# Patient Record
Sex: Male | Born: 1945
Health system: Southern US, Community
[De-identification: ages and names within clinical notes are randomized; demographics above are authoritative.]

## PROBLEM LIST (undated history)

## (undated) DIAGNOSIS — I4891 Unspecified atrial fibrillation: Secondary | ICD-10-CM

## (undated) DIAGNOSIS — Z87442 Personal history of urinary calculi: Secondary | ICD-10-CM

## (undated) DIAGNOSIS — M069 Rheumatoid arthritis, unspecified: Secondary | ICD-10-CM

## (undated) DIAGNOSIS — R519 Headache, unspecified: Secondary | ICD-10-CM

## (undated) DIAGNOSIS — E785 Hyperlipidemia, unspecified: Secondary | ICD-10-CM

## (undated) DIAGNOSIS — Z9289 Personal history of other medical treatment: Secondary | ICD-10-CM

## (undated) DIAGNOSIS — I10 Essential (primary) hypertension: Secondary | ICD-10-CM

## (undated) DIAGNOSIS — T8859XA Other complications of anesthesia, initial encounter: Secondary | ICD-10-CM

## (undated) DIAGNOSIS — R51 Headache: Secondary | ICD-10-CM

## (undated) DIAGNOSIS — J189 Pneumonia, unspecified organism: Secondary | ICD-10-CM

## (undated) DIAGNOSIS — E119 Type 2 diabetes mellitus without complications: Secondary | ICD-10-CM

## (undated) DIAGNOSIS — R001 Bradycardia, unspecified: Secondary | ICD-10-CM

## (undated) DIAGNOSIS — J42 Unspecified chronic bronchitis: Secondary | ICD-10-CM

## (undated) DIAGNOSIS — K219 Gastro-esophageal reflux disease without esophagitis: Secondary | ICD-10-CM

## (undated) DIAGNOSIS — K449 Diaphragmatic hernia without obstruction or gangrene: Secondary | ICD-10-CM

## (undated) DIAGNOSIS — Z973 Presence of spectacles and contact lenses: Secondary | ICD-10-CM

## (undated) DIAGNOSIS — T7840XA Allergy, unspecified, initial encounter: Secondary | ICD-10-CM

## (undated) DIAGNOSIS — H9190 Unspecified hearing loss, unspecified ear: Secondary | ICD-10-CM

## (undated) DIAGNOSIS — D649 Anemia, unspecified: Secondary | ICD-10-CM

## (undated) DIAGNOSIS — M1990 Unspecified osteoarthritis, unspecified site: Secondary | ICD-10-CM

## (undated) HISTORY — DX: Hyperlipidemia, unspecified: E78.5

## (undated) HISTORY — DX: Diaphragmatic hernia without obstruction or gangrene: K44.9

## (undated) HISTORY — PX: JOINT REPLACEMENT: SHX530

## (undated) HISTORY — PX: KNEE ARTHROSCOPY: SHX127

## (undated) HISTORY — PX: OTHER SURGICAL HISTORY: SHX169

## (undated) HISTORY — DX: Unspecified atrial fibrillation: I48.91

## (undated) HISTORY — DX: Gastro-esophageal reflux disease without esophagitis: K21.9

## (undated) HISTORY — PX: INGUINAL HERNIA REPAIR: SUR1180

## (undated) HISTORY — DX: Allergy, unspecified, initial encounter: T78.40XA

## (undated) HISTORY — DX: Rheumatoid arthritis, unspecified: M06.9

---

## 2000-02-25 ENCOUNTER — Emergency Department (HOSPITAL_COMMUNITY): Admission: EM | Admit: 2000-02-25 | Discharge: 2000-02-25 | Payer: Self-pay | Admitting: Internal Medicine

## 2001-05-15 HISTORY — PX: TOTAL KNEE ARTHROPLASTY: SHX125

## 2001-07-30 ENCOUNTER — Encounter: Payer: Self-pay | Admitting: Orthopedic Surgery

## 2001-08-05 ENCOUNTER — Inpatient Hospital Stay (HOSPITAL_COMMUNITY): Admission: RE | Admit: 2001-08-05 | Discharge: 2001-08-07 | Payer: Self-pay | Admitting: Orthopedic Surgery

## 2002-06-23 ENCOUNTER — Emergency Department (HOSPITAL_COMMUNITY): Admission: EM | Admit: 2002-06-23 | Discharge: 2002-06-23 | Payer: Self-pay | Admitting: Emergency Medicine

## 2002-08-20 ENCOUNTER — Encounter: Payer: Self-pay | Admitting: Cardiovascular Disease

## 2002-10-03 ENCOUNTER — Encounter: Payer: Self-pay | Admitting: *Deleted

## 2002-10-03 ENCOUNTER — Ambulatory Visit (HOSPITAL_COMMUNITY): Admission: RE | Admit: 2002-10-03 | Discharge: 2002-10-03 | Payer: Self-pay | Admitting: *Deleted

## 2004-02-14 ENCOUNTER — Encounter: Admission: RE | Admit: 2004-02-14 | Discharge: 2004-02-14 | Payer: Self-pay | Admitting: Family Medicine

## 2006-02-23 ENCOUNTER — Encounter: Payer: Self-pay | Admitting: Vascular Surgery

## 2006-02-23 ENCOUNTER — Ambulatory Visit (HOSPITAL_COMMUNITY): Admission: RE | Admit: 2006-02-23 | Discharge: 2006-02-23 | Payer: Self-pay | Admitting: Orthopedic Surgery

## 2006-05-15 DIAGNOSIS — Z9289 Personal history of other medical treatment: Secondary | ICD-10-CM

## 2006-05-15 HISTORY — DX: Personal history of other medical treatment: Z92.89

## 2006-05-15 HISTORY — PX: REVISION TOTAL KNEE ARTHROPLASTY: SUR1280

## 2006-09-07 ENCOUNTER — Ambulatory Visit: Payer: Self-pay | Admitting: Cardiology

## 2006-10-15 ENCOUNTER — Ambulatory Visit: Payer: Self-pay | Admitting: Cardiology

## 2006-10-15 LAB — CONVERTED CEMR LAB
Albumin: 3.9 g/dL (ref 3.5–5.2)
Alkaline Phosphatase: 58 units/L (ref 39–117)
Total CHOL/HDL Ratio: 3.2
Total Protein: 6.3 g/dL (ref 6.0–8.3)

## 2006-10-29 ENCOUNTER — Inpatient Hospital Stay (HOSPITAL_COMMUNITY): Admission: RE | Admit: 2006-10-29 | Discharge: 2006-11-01 | Payer: Self-pay | Admitting: Orthopedic Surgery

## 2006-10-29 ENCOUNTER — Ambulatory Visit: Payer: Self-pay | Admitting: Cardiovascular Disease

## 2006-11-14 ENCOUNTER — Encounter: Admission: RE | Admit: 2006-11-14 | Discharge: 2006-11-14 | Payer: Self-pay | Admitting: Orthopedic Surgery

## 2006-12-14 ENCOUNTER — Ambulatory Visit: Payer: Self-pay | Admitting: Cardiology

## 2007-03-25 ENCOUNTER — Encounter (INDEPENDENT_AMBULATORY_CARE_PROVIDER_SITE_OTHER): Payer: Self-pay | Admitting: Orthopedic Surgery

## 2007-03-25 ENCOUNTER — Ambulatory Visit: Payer: Self-pay | Admitting: *Deleted

## 2007-03-25 ENCOUNTER — Ambulatory Visit (HOSPITAL_COMMUNITY): Admission: RE | Admit: 2007-03-25 | Discharge: 2007-03-25 | Payer: Self-pay | Admitting: Orthopedic Surgery

## 2007-04-05 ENCOUNTER — Encounter: Admission: RE | Admit: 2007-04-05 | Discharge: 2007-04-05 | Payer: Self-pay | Admitting: Orthopedic Surgery

## 2007-05-14 ENCOUNTER — Encounter: Admission: RE | Admit: 2007-05-14 | Discharge: 2007-05-14 | Payer: Self-pay | Admitting: Orthopedic Surgery

## 2007-05-14 ENCOUNTER — Encounter (INDEPENDENT_AMBULATORY_CARE_PROVIDER_SITE_OTHER): Payer: Self-pay | Admitting: Interventional Radiology

## 2007-05-14 ENCOUNTER — Other Ambulatory Visit: Admission: RE | Admit: 2007-05-14 | Discharge: 2007-05-14 | Payer: Self-pay | Admitting: Interventional Radiology

## 2007-05-16 HISTORY — PX: CYSTECTOMY: SUR359

## 2007-06-07 ENCOUNTER — Encounter: Admission: RE | Admit: 2007-06-07 | Discharge: 2007-06-07 | Payer: Self-pay | Admitting: Orthopedic Surgery

## 2007-07-11 ENCOUNTER — Ambulatory Visit (HOSPITAL_COMMUNITY): Admission: RE | Admit: 2007-07-11 | Discharge: 2007-07-12 | Payer: Self-pay | Admitting: Orthopedic Surgery

## 2007-07-11 ENCOUNTER — Encounter (INDEPENDENT_AMBULATORY_CARE_PROVIDER_SITE_OTHER): Payer: Self-pay | Admitting: Orthopedic Surgery

## 2007-07-19 ENCOUNTER — Ambulatory Visit (HOSPITAL_COMMUNITY): Admission: RE | Admit: 2007-07-19 | Discharge: 2007-07-19 | Payer: Self-pay | Admitting: Orthopedic Surgery

## 2007-07-19 ENCOUNTER — Ambulatory Visit: Payer: Self-pay | Admitting: Vascular Surgery

## 2007-07-19 ENCOUNTER — Encounter (INDEPENDENT_AMBULATORY_CARE_PROVIDER_SITE_OTHER): Payer: Self-pay | Admitting: Orthopedic Surgery

## 2007-09-11 ENCOUNTER — Ambulatory Visit (HOSPITAL_COMMUNITY): Admission: RE | Admit: 2007-09-11 | Discharge: 2007-09-11 | Payer: Self-pay | Admitting: Orthopedic Surgery

## 2007-09-11 ENCOUNTER — Ambulatory Visit: Payer: Self-pay | Admitting: Surgery

## 2007-09-11 ENCOUNTER — Encounter (INDEPENDENT_AMBULATORY_CARE_PROVIDER_SITE_OTHER): Payer: Self-pay | Admitting: Orthopedic Surgery

## 2008-08-03 ENCOUNTER — Encounter: Admission: RE | Admit: 2008-08-03 | Discharge: 2008-11-01 | Payer: Self-pay | Admitting: Anesthesiology

## 2008-08-04 ENCOUNTER — Ambulatory Visit: Payer: Self-pay | Admitting: Anesthesiology

## 2008-09-01 ENCOUNTER — Ambulatory Visit: Payer: Self-pay | Admitting: Anesthesiology

## 2008-12-07 ENCOUNTER — Encounter: Admission: RE | Admit: 2008-12-07 | Discharge: 2009-03-07 | Payer: Self-pay | Admitting: Anesthesiology

## 2008-12-08 ENCOUNTER — Ambulatory Visit: Payer: Self-pay | Admitting: Anesthesiology

## 2009-02-02 ENCOUNTER — Ambulatory Visit: Payer: Self-pay | Admitting: Anesthesiology

## 2009-03-29 ENCOUNTER — Encounter: Admission: RE | Admit: 2009-03-29 | Discharge: 2009-05-12 | Payer: Self-pay | Admitting: Anesthesiology

## 2009-03-30 ENCOUNTER — Ambulatory Visit: Payer: Self-pay | Admitting: Anesthesiology

## 2009-05-28 ENCOUNTER — Encounter: Admission: RE | Admit: 2009-05-28 | Discharge: 2009-08-26 | Payer: Self-pay | Admitting: Anesthesiology

## 2009-06-01 ENCOUNTER — Ambulatory Visit: Payer: Self-pay | Admitting: Anesthesiology

## 2009-07-27 ENCOUNTER — Ambulatory Visit: Payer: Self-pay | Admitting: Anesthesiology

## 2009-11-17 ENCOUNTER — Emergency Department (HOSPITAL_COMMUNITY): Admission: EM | Admit: 2009-11-17 | Discharge: 2009-11-17 | Payer: Self-pay | Admitting: Emergency Medicine

## 2009-11-24 ENCOUNTER — Encounter: Admission: RE | Admit: 2009-11-24 | Discharge: 2010-02-22 | Payer: Self-pay | Admitting: Anesthesiology

## 2009-11-30 ENCOUNTER — Ambulatory Visit: Payer: Self-pay | Admitting: Anesthesiology

## 2010-01-19 ENCOUNTER — Ambulatory Visit (HOSPITAL_COMMUNITY): Admission: RE | Admit: 2010-01-19 | Discharge: 2010-01-19 | Payer: Self-pay | Admitting: General Surgery

## 2010-04-29 ENCOUNTER — Encounter
Admission: RE | Admit: 2010-04-29 | Discharge: 2010-04-29 | Payer: Self-pay | Source: Home / Self Care | Attending: Family Medicine | Admitting: Family Medicine

## 2010-07-28 LAB — CBC
HCT: 39.6 % (ref 39.0–52.0)
Hemoglobin: 14 g/dL (ref 13.0–17.0)
MCH: 31.4 pg (ref 26.0–34.0)

## 2010-07-28 LAB — URINALYSIS, ROUTINE W REFLEX MICROSCOPIC
Bilirubin Urine: NEGATIVE
Glucose, UA: NEGATIVE mg/dL
Protein, ur: NEGATIVE mg/dL
Specific Gravity, Urine: 1.022 (ref 1.005–1.030)
pH: 5.5 (ref 5.0–8.0)

## 2010-07-28 LAB — SURGICAL PCR SCREEN
MRSA, PCR: NEGATIVE
Staphylococcus aureus: NEGATIVE

## 2010-07-28 LAB — BASIC METABOLIC PANEL
CO2: 29 mEq/L (ref 19–32)
Chloride: 103 mEq/L (ref 96–112)
Creatinine, Ser: 1.05 mg/dL (ref 0.4–1.5)
GFR calc Af Amer: >60 mL/min (ref >60)
GFR calc non Af Amer: >60 mL/min (ref >60)
Glucose, Bld: 107 mg/dL — ABNORMAL HIGH (ref 70–99)
Potassium: 4.6 mEq/L (ref 3.5–5.1)
Sodium: 138 mEq/L (ref 135–145)

## 2010-07-28 LAB — GLUCOSE, CAPILLARY

## 2010-07-31 LAB — CBC
MCHC: 33.7 g/dL (ref 30.0–36.0)
Platelets: 232 10*3/uL (ref 150–400)
RBC: 4.63 MIL/uL (ref 4.22–5.81)
RDW: 13.1 % (ref 11.5–15.5)
WBC: 11.1 10*3/uL — ABNORMAL HIGH (ref 4.0–10.5)

## 2010-07-31 LAB — COMPREHENSIVE METABOLIC PANEL
Alkaline Phosphatase: 81 U/L (ref 39–117)
Calcium: 9.4 mg/dL (ref 8.4–10.5)
GFR calc Af Amer: >60 mL/min (ref >60)
Total Protein: 7.3 g/dL (ref 6.0–8.3)

## 2010-07-31 LAB — DIFFERENTIAL
Basophils Absolute: 0 10*3/uL (ref 0.0–0.1)
Basophils Relative: 0 % (ref 0–1)
Eosinophils Relative: 0 % (ref 0–5)
Lymphocytes Relative: 7 % — ABNORMAL LOW (ref 12–46)
Lymphs Abs: 0.8 10*3/uL (ref 0.7–4.0)
Monocytes Absolute: 0.6 10*3/uL (ref 0.1–1.0)
Monocytes Relative: 6 % (ref 3–12)

## 2010-07-31 LAB — GLUCOSE, CAPILLARY: Glucose-Capillary: 244 mg/dL — ABNORMAL HIGH (ref 70–99)

## 2010-07-31 LAB — URINALYSIS, ROUTINE W REFLEX MICROSCOPIC
Bilirubin Urine: NEGATIVE
Glucose, UA: >1000 mg/dL — AB
Hgb urine dipstick: NEGATIVE
Protein, ur: 100 mg/dL — AB

## 2010-07-31 LAB — URINE MICROSCOPIC-ADD ON

## 2010-09-27 NOTE — Assessment & Plan Note (Signed)
Ayaz Calia comes to the pain management today with his permission case  manager to the room.  1. He comes to [place] today complaining of his knee pain, left greater      than the right, some right-sided knee pain.  No advancing      pseudomotor changes, difficulty with endurance.  Range of motion      activities are pretty stable to presentation.  2. I am going to go ahead and again treat him conservatively, tramadol      basis, primary pain control and Darvocet for breakthrough.  We will      go ahead and probably moved to nonnarcotic options overtime.   Objectively, he is stable, well-healed incision.  No advancing  pseudomotor changes.  No edema or discoloration.   IMPRESSION:  Osteoarthritis of the knee.   PLAN:  Conservative management.           ______________________________  Celene Kras, MD     HH/MedQ  D:  12/08/2008 13:45:07  T:  12/09/2008 04:32:46  Job #:  [id number]

## 2010-09-27 NOTE — Discharge Summary (Signed)
NAMEMarland Kitchen  LESLIE, LANGILLE                  ACCOUNT NO.:  [id number]   MEDICAL RECORD NO.:  [id number]          PATIENT TYPE:  INP   LOCATION:  1614                         FACILITY:  [facility]   PHYSICIAN:  John L. Rendall, M.D.  DATE OF BIRTH:  August 07, 1945   DATE OF ADMISSION:  10/29/2006  DATE OF DISCHARGE:  11/01/2006                               DISCHARGE SUMMARY   ADMISSION DIAGNOSES:  1. Loose femoral component, left total knee, after a fall.  2. Type 2 diabetes mellitus, diet or oral medication controlled.  3. Gastroesophageal reflux disease.  4. Hiatal hernia.  5. Bradycardia.   DISCHARGE DIAGNOSES:  1. Loose femoral and tibial components, left total knee, status post      revision left total knee arthroplasty, due to fall at work.  2. Acute blood loss anemia secondary to surgery.  3. Constipation, now resolved.  4. Type 2 diabetes mellitus, diet and oral medication controlled.  5. Gastroesophageal reflux disease.  6. Hiatal hernia.  7. Bradycardia.   SURGICAL PROCEDURE:  On October 29, 2006, Mr. Apps underwent a revision of  both the femoral and tibial components of his left total knee by Dr.  Jonny Ruiz L.  Rendall, assisted by Arnoldo Morale, PA-C.  He had an oval dome  patella, 3-peg, size 41 mm placed with a PFC Sigma knee and left femoral  component size 4.  A Sigma femoral adaptable +2/-2offset with a femoral  adapter 5 degree.  A Universal stem fluted revision stem 75 x 22 mm.  Second Universal revision stem fluted 75 x 16 mm with a MBT revision  cemented size 4 tibial tray.  A Sigma RPTC3 insert size 4, 17.5-mm  thickness.   COMPLICATIONS:  None.   COMPLICATIONS:  1. Physical therapy consult on October 30, 2006.  2. Cardiology consult by Dr. Eden Emms with [facility] on October 30, 2006.  3. Occupational therapy consult on October 31, 2006.   HISTORY OF PRESENT ILLNESS:  This 65 year old white male patient  presented Dr. Priscille Kluver with a history of a left knee replacement done  by  him in March of 2003.  He has been doing well until he fell 3 months ago  while at work.  He reports he stepped on a root and fell down an incline  on August 02, 2006.  He had had no other injury to the knee and no  problems prior to that.  At this point, the pain in the knee is a  constant burn to sharp sensation over the entire joint with radiation  into the foot.  It increases when he gets up in the morning and  decreases with rest.  Celebrex and Vicodin have provided minimal relief.  The knee pops, locks, swells, and keeps him up at night.  X-rays show  signs of a loosening femoral component, and because of that he is  presenting for a revision of his left total knee.   HOSPITAL COURSE:  Mr. Villescas tolerated his surgical procedure well without  immediate postoperative complications.  He was transferred to [address].  He was noted to have some bradycardia, so a cardiology consult was  obtained and they followed him throughout his hospitalization.  On  postoperative day #1 he was afebrile, vitals stable.  Hemoglobin 9.9,  hematocrit 28.9.  He was still bradycardic.  Dr. Eden Emms did see him at  that time.  He was continued on his current medications and started on  therapy per protocol.   On postoperative day #2 he was feeling better.  He had had some nausea  the day before that had resolved.  He was voiding without difficulty.  T-  max 99.3, vitals stable.  Pulse 61, BP 128/61.  Hemoglobin had dropped  to 8.6 with hematocrit of 24.2, so he was subsequently transfused with 2  units of packed red blood cells.  His Hemovac was discontinued, dressing  was changed, and he was continued on therapy and switched to just oral  medications for pain.   On postoperative day #3 he is doing extremely well with therapy.  His  pain is well-controlled with Hydrocodone.  He is voiding and having  bowel movements without difficulty.  Nothing has grown from his knee  cultures.  His hemoglobin has improved  to 9.6 with hematocrit of 27.7.  It is felt he is ready for discharge home, and he will be discharged  home later today.   DISCHARGE DIET:  He is to resume his regular prehospitalization diabetic  diet.   MEDICATIONS:  He is to resume his home medications, except not the  hydrocodone dose he was on at home.  He has a new script for that, and  he is not to resume his aspirin until his Lovenox is complete.  Home  medications include:  1. Metformin 500 mg two tablets p.o. b.i.d.  2. Actos 30 mg one tablet p.o. q.p.m.  3. Lasix 20 mg one tablet p.o. q.a.m.  4. Aceon 8 mg half a tablet p.o. q.a.m.  5. Celebrex 200 mg one tablet p.o. q.a.m.  6. Fish oil 400 mg one tablet p.o. q.a.m.  7. Multivitamin one tablet p.o. q.a.m.  8. Crestor 10 mg one tablet p.o. q.a.m.  9. Again, he is to resume his 81 mg aspirin a day on November 05, 2006,      and he is not to be on the 5/500 hydrocodone while on other      medications.   ADDITIONAL MEDICATIONS AT THIS TIME:  1. Arixtra 2.5 mg subcutaneously at 8 p.m., with the last dose on November 04, 2006, and on November 05, 2006 he is to resume his aspirin. Three      are given with no refill.  2. Norco 5/325 mg one to two tablets p.o. q.4h. p.r.n. for pain, 60      with no refill.  3. Robaxin 500 mg one to two tablets p.o. q.6h. p.r.n. for spasms, 40      with no refill.  4. He is also to take an iron supplement twice a day for 1 month.   ACTIVITY:  He can be out of bed, weightbearing as tolerated on the left  leg with the use of the walker.  He is to have home CPM 0-100 degrees  6-  8 hours a day and home health PT per [facility].  Please  see the blue total knee discharge sheet for further activity  instructions.   WOUND CARE:  He Brassell shower after no drainage from the wound  for 2 days.  Please see the blue total knee discharge sheet for further wound care  instructions.  FOLLOW UP:  He needs to follow up with Dr. Priscille Kluver in our office  on  Tuesday, November 13, 2006 and needs to call [phone] for that appointment.   LABORATORY DATA:  Hemoglobin and hematocrit have ranged from 13.1 and  38.7 on October 23, 2006 to a low of 8.6 and 24.2 on October 31, 2006 and to  9.6 and 27.7 on November 01, 2006.  Platelets have ranged from 252 on the  October 23, 2006 to 170 on October 31, 2006 and to 143 on November 01, 2006.  White count has remained within normal limits.  Glucoses ranged from 157  on October 23, 2006 133 on October 31, 2006.  BUN and creatinine have fallen  from 32 and 1 on October 23, 2006 to 8 and 0.86 on October 31, 2006.   Gram stains done fluid from his left knee on October 29, 2006 showed rare  white blood cells, no organisms seen.  At this point, there has been no  growth from the cultures.      Legrand Pitts Duffy, P.A.      John L. Rendall, M.D.  Electronically Signed    KED/MEDQ  D:  11/01/2006  T:  11/01/2006  Job:  [id number]   cc:   Thomas C. Wall, MD, FACC  [address]  [address]  [place]  [place] [address]

## 2010-09-27 NOTE — H&P (Signed)
NAMEMarland Jones  Joel, Jones                  ACCOUNT NO.:  [id number]   MEDICAL RECORD NO.:  [id number]          PATIENT TYPE:  INP   LOCATION:  1614                         FACILITY:  [facility]   PHYSICIAN:  Noralyn Pick. Eden Emms, MD, FACCDATE OF BIRTH:  [birth year]   DATE OF ADMISSION:  10/29/2006  DATE OF DISCHARGE:                              HISTORY & PHYSICAL   HISTORY OF PRESENT ILLNESS:  The patient is a 65 year old patient we  were asked to see for bradycardia, post redo knee surgery.  The surgery  was done by Dr. Marciano Sequin on 10/29/06.   The patient was seen by Dr. Juanito Doom on 09/07/06 to clear him for  surgery.  He has a history of possible nonischemic cardiomyopathy with  an EF of 50% back in 2004.  He subsequently had a heart catheterization  done in 05/04 which showed a normal systolic function and no coronary  artery disease.   The patient indicates that he has always had a relatively slow heart  rate and indeed in looking through the notes from our office, his heart  rates are chronically in the mid 40s to low 50s.  He has never had  syncope, high grade heart block, or the need to stop any AV nodal  blocking drugs.  Similarly he has not had any significant palpitations.   In talking to the patient his surgery went well.  He had general  anesthesia with the femoral nerve block.  He has had some anemia postop.  Currently his symptoms are only nausea and he has had no significant  nausea, no melena, no frank abdominal pain, and he is taking liquids.  There has been specifically no chest pain, no shortness of breath, no  paroxysmal nocturnal dyspnea or orthopnea, and reviewing his telemetry  he has been in sinus rhythm to sinus bradycardia with no heart block.   REVIEW OF SYSTEMS:  His review of systems is otherwise negative except  for pain in his left knee which is to be expected.   CARDIAC RISK FACTORS:  The cardiac risk factors include diabetes for  about four years, mixed  hyperlipidemia with Crestor started in April,  question of mild hypertension, on Aceon.  He had the initial left knee  surgery in 2003.  He slipped on some ice and this led to some chronic  problems which needed redo surgery.   PAST MEDICAL HISTORY:  His past medical history is otherwise  unremarkable.   SOCIAL HISTORY:  He is happily married.  He works in [employer].  He  is up at the [facility]  quite a bit.  His wife was in the room with  him.  He does not drink or smoke.  He is fairly active.  He likes to  grow rhododendrons.  He considers himself a piddler.   CURRENT MEDICATIONS:  His current medications include multivitamins and  aspirin a day, metformin 1 gram b.i.d., Actos 30 a day, Aceon 4 a day,  hydrocodone and Celebrex for pain, Lasix 20 a day, and he is intolerant  to Lipitor which causes  myalgias, also on codeine and OxyContin.   FAMILY HISTORY:  The family history is noncontributory.   PHYSICAL EXAMINATION:  GENERAL:  On examination he is postop.  He is a  healthy appearing middle aged male.  He is in some distress from pain in  his left knee.  He is somewhat pale.  VITAL SIGNS:  His blood pressure is 110/70.  He is sinus rhythm.  His  rate is 62.  He is afebrile with a temperature of 97.9.  Room air sat is  100%.  Respiratory rate is 18.  HEENT:  Normal, there is no thyromegaly or lymphadenopathy, no carotid  bruits.  NECK:  The neck is supple.  LUNGS:  The lungs are clear without wheezing, good diaphragmatic motion.  HEART:  There is an S1 and an S2 with a soft systolic murmur, PMI is  normal.  ABDOMEN:  The abdomen is benign.  Bowel sounds are positive.  No  abdominal aortic aneurysm, no hepatosplenomegaly, no hepatojugular  reflux, no tenderness.  EXTREMITIES:  Femorals are +3 bilaterally without bruit.  He has an  immobilizer on the left knee, status post surgery.  His PTs are palpable  bilaterally.  There is some lower extremity edema in the left leg.   There are no cords or evidence of deep venous thrombosis.  NEUROLOGICAL:  Nonfocal.  I did not do a muscular examination since he  is postop.   INVESTIGATIONS:  Preoperative EKG was normal.  There is not a postop  one.  Telemetry is reviewed and there is no significant heart block or  arrhythmia.  His current laboratory work is remarkable for a potassium  of 3.8, BUN of 13, creatinine of 0.8.  Creatinine has gone from 38.7 to  28.9.  Sedimentation rate is 5, LDL is 60.  His CBGs have been somewhat  elevated at 228.   IMPRESSION:  1. Heart rate relative bradycardia, asymptomatic, patient has had slow      heart rates his whole life.  They are asymptomatic.  There is no      evidence of AV block.  He is not on any AV nodal blocking drugs.      There is no history of coronary artery disease.  He does not need      to be watched on telemetry.  His relative bradycardia Stuhr be made      worse by his nausea.  He Belsito have a postop ileus.  This should be      treated with Zofran or Phenergan, otherwise I think that he is      doing fine.  2. Hypertension, continue Aceon with reasonable blood pressure tending      to be a little bit on the low side postop.  3. Blood loss during surgery with a relative drop in the hemoglobin.      Would leave it up to the surgeons as to whether transfusion up to a      hemoglobin of 10 or just replace him iron.  4. Diabetes, Goon need sliding scale coverage for elevated CBGs in the      morning.  He is back on his metformin of a gram b.i.d. and should      be on a 2000 calorie ADA diet.  5. Lower extremity edema, continue preoperative dose of Lasix 20 mg a      day, no evidence of deep venous thrombosis, mobilize the lower      extremities as soon as  possible with deep venous thrombosis      prophylaxis Lovenox post knee surgery.  Continue aspirin therapy.  6. Hypercholesterolemia:  The patient apparently has had myalgias with     Lipitor.  He has been on  Crestor since he saw Dr. Daleen Squibb in April.      His LDL cholesterol was 60.  Overall I think that this is also      stable.   We would be happy to follow the patient along in the hospital.      Theron Arista C. Eden Emms, MD, [facility]  Electronically Signed     PCN/MEDQ  D:  10/30/2006  T:  10/30/2006  Job:  [id number]   cc:   Noralyn Pick. Eden Emms, MD, [facility]  [address]  [address]  [place]  [place] [address]

## 2010-09-27 NOTE — Op Note (Signed)
NAME:  Joel, Jones                  ACCOUNT NO.:  [id number]   MEDICAL RECORD NO.:  [id number]          PATIENT TYPE:  OIB   LOCATION:  1606                         FACILITY:  [facility]   PHYSICIAN:  John L. Rendall, M.D.  DATE OF BIRTH:  1946-04-06   DATE OF PROCEDURE:  07/11/2007  DATE OF DISCHARGE:                               OPERATIVE REPORT   PREOPERATIVE DIAGNOSIS:  Painful popliteal cyst behind left total knee.   SURGICAL PROCEDURE:  Excision of popliteal cyst.   POSTOPERATIVE DIAGNOSIS:  Painful popliteal cyst behind left total knee,  deflated popliteal cyst with evidence of chronic hemorrhage.   SURGEON:  John L. Rendall, M.D.   ASSISTANT:  Legrand Pitts. Duffy, P.A.C.   ANESTHESIA:  General.   PROCEDURE:  Under general anesthesia the patient is placed prone.  The  leg was elevated, it was wrapped out with an Esmarch and the tourniquet  is elevated at 300 mm.  The leg was then prepared with DuraPrep and  draped as a sterile field.  The L-shaped portion of an S incision is  made across the popliteal space beginning superiorly and medially,  dissecting transversely across.  Dissection was carried through the skin  and subcutaneous tissue.  Dissection was then carried down to the  interval between the medial gastroc,  semitendinosus and  semimembranosus.  At this level a somewhat lobulated lipoma is found and  this was removed.  Dissection down this bursal interval is then done  slowly and steadily, and a deflated somewhat scarred popliteal cyst is  found adherent to the medial gastrocnemius.  It is carefully dissected  free, the stalk was found coming from the back of the joint, and this is  tied off with a pursestring type suture that is done with #1 Vicryl.  The stump was then amputated and a more proximal suture is then placed  as a security measure with #1 Vicryl.  There is no other evidence of  cystic lesion behind the knee.  The popliteal vessels are palpated in  the  interval between the gastroc.  Careful palpation of the gastroc  reveals no evidence of tumor within it and the semimembranosus showed no  evidence of any other abnormality.  Lateral gastroc felt normal.  At  this point the tourniquet is let down at 38 minutes.  Several small  vessels were cauterized.  The wound was then closed with #1 Vicryl, 2-0  Vicryl and skin clips.  It should be noted that the specimen was then  examined.  It is incised and it has the appearance of a deflated  popliteal cyst that has had chronic recurrent hemorrhage within.  It was  sent to the laboratory.      John L. Rendall, M.D.  Electronically Signed     JLR/MEDQ  D:  07/11/2007  T:  07/12/2007  Job:  [id number]

## 2010-09-27 NOTE — Assessment & Plan Note (Signed)
[facility].  I evaluated  him and reviewed the health and history form and 14-point review of  systems.   1. Still complaining of left leg pain, but is satisfactorily      responding to current medication profile.  Minimal side effects.  I      have reviewed these with him, very acceptable functional parameters      and relief cycling with restorative sleep capacity.  2. Continue Lyrica, we will increase the Ultram ER to 200 mg, and he      will continue the use TENS, which he is benefiting.  Occasional      Roxicodone for breakthrough.  We will need to see him for 2-3      months.   Objectively some left leg pain, pain with extension, well-healed  incisions.  No advancing pseudomotor changes.  Otherwise nothing new  neurologically.   IMPRESSION:  Peripheral neuropathy unspecified osteoarthritis to the  knee.   PLAN:  Conservative management.  Discharge instructions given.  Review  of medication.           ______________________________  Celene Kras, MD     HH/MedQ  D:  09/01/2008 10:31:24  T:  09/02/2008 00:10:33  Job #:  [id number]

## 2010-09-27 NOTE — Op Note (Signed)
NAME:  Joel Jones, Joel Jones                  ACCOUNT NO.:  [id number]   MEDICAL RECORD NO.:  [id number]          PATIENT TYPE:  INP   LOCATION:  NA                           FACILITY:  [facility]   PHYSICIAN:  John L. Rendall, M.D.  DATE OF BIRTH:  12/15/1945   DATE OF PROCEDURE:  10/29/2006  DATE OF DISCHARGE:                               OPERATIVE REPORT   PREOPERATIVE DIAGNOSIS:  Failed left total knee.   SURGICAL PROCEDURES:  Revision left total knee -- all components.   POSTOPERATIVE DIAGNOSIS:  Failure all components left total knee.   SURGEON:  John L. Rendall, M.D.   ASSISTANT:  Legrand Pitts. Duffy, P.A.   ANESTHESIA:  General with femoral nerve block.   PATHOLOGY:  The patient was known preoperatively to have a  radiographically loose femoral component which was felt secondary to  falling on slick ice this past winter.  Since that time, it has become  progressively more symptomatic.  There have been no findings of  infection.  At the time of surgery there was small particle disease.  The femoral component literally fell off the femur.  At first  appearance, the tibial component appeared well-fixed; but as the  exposure was completed, there appeared to be ability to pass a Freer  under the posterolateral tibia tray and anterolateral tibial tray; and  once it was lifted off there was a particulate cyst under the tibial  tray medially in the region of the tibial spine with apparent reactive  synovitis to foreign body debris.   DESCRIPTION OF PROCEDURE:  Under general anesthesia with femoral nerve  block, the left knee is prepared with DuraPrep and draped as a sterile  field.  The previous surgical scar was excised.  The skin incision was  reopened and the patella everted.  The femoral component literally fell  off the femur when knocked loose with a Cobb elevator.  Tibial  polyethylene was removed easily.  The tibial tray was first examined and  felt okay across the front edge; patellar  button was removed.   At this point a full synovectomy was carried out.  The femur was  prepared for revision with the a TC3 and the appropriate reaming up to  size 22 was done.  The chamfer cuts etcetera were made for that the TC3.  At this point, the permanent components were being assembled.  Further  probing about the tibial tray caused serious doubt about its worthiness;  and a decision was made to remove it also.  It was the right choice.  The tibial stem was the only part that was fixed without loosening at  this point.  Cement plug was removed.  The tibia was then progressively  reamed; and a 16 reamer gave the best fit.  Assembling it, a  trial TC3  femur and tibial component 17.5 gave best fit, alignment, and stability.  Permanent components were then cemented in place with vancomycin, one  pack per each pack of cement.  All components were cemented in place  including a patellar button; and once the cement had  hardened, the  tourniquet was let down at 2 hours and 10 minutes.  Multiple small  vessels were cauterized.  A medium Hemovac was inserted, at this point,  the trial tibial plastic was removed as it seemed to be the right size;  and a permanent tibial plastic inserted.  Care was taken at this time,  to remove all methacrylate debris.  The knee was then closed in layers  with #1 Tycron, #0 and 2-0 Vicryl and skin clips.  The patient tolerated  the procedure well and returned to recovery in good condition.      John L. Rendall, M.D.  Electronically Signed     JLR/MEDQ  D:  10/29/2006  T:  10/29/2006  Job:  [id number]

## 2010-09-27 NOTE — Assessment & Plan Note (Signed)
Joel Jones                            CARDIOLOGY OFFICE NOTE   Joel Jones, Joel Jones                         MRN:          [id number]  DATE:12/14/2006                            DOB:          [birth year]    Joel Jones returns today for the following issues:  1. Non-obstructive coronary disease by catheterization in 2004.  2. Mildly decreased left ventricular function with an ejection      fraction of 50%. He is functional class 1.  3. History of sinus bradycardia without any AV blocking drugs.  4. Mixed hyperlipidemia.   He is now taking Crestor per our last visit. His lipids look remarkably  better. Total is 102, triglycerides are 52, HDL 31.7, LDL 60. LFTs were  normal.   He remains on Actos and metformin with Dr.  Collins Scotland. He is not familiar  with his hemoglobin A1c today.   He is status post left knee replacement by Dr.  Priscille Kluver. He is slowly  getting more mobile.   CURRENT MEDICATIONS:  1. Aspirin 81 mg daily.  2. Fish oil 400 mg a day.  3. Multivitamin.  4. Metformin 1000 mg b.i.d.  5. Actos 30 mg a day.  6. Aceon 4 mg a day.  7. Furosemide 20 mg a day.  8. Crestor 10 mg a day.  9. Vitamin E 400 mg a day.  10.Iron.   PHYSICAL EXAMINATION:  His blood pressure today is 119/63, pulse 52 and  sinus brady. Weight is 201, down 4.  HEENT: Normocephalic, atraumatic. PERRLA. Extra-ocular movements intact.  Sclerae are clear. Facial symmetry is normal. Carotid upstrokes are  equal bilaterally without bruits. There is no JVD.  Thyroid is not  enlarged. Trachea is midline.  LUNGS:  Are clear.  HEART: Reveals a nondisplaced PMI and normal S1, S2.  ABDOMEN: Protuberant with good bowel sounds.  EXTREMITIES: Reveals no cyanosis, clubbing or edema. Pulses are intact.  He is status post left knee replacement.   ASSESSMENT/PLAN:  Joel Jones is doing well. I have encouraged him to stay  on the Crestor and to followup with Dr.  Collins Scotland concerning tight blood  sugar control. I will plan on seeing him back again in six months. He  will need repeat lipids in June 2009.     Thomas C. Daleen Squibb, MD, [facility]  Electronically Signed    TCW/MedQ  DD: 12/14/2006  DT: 12/14/2006  Job #: [id number]   cc:   Tammy R. Collins Scotland, M.D.

## 2010-09-27 NOTE — Assessment & Plan Note (Signed)
Joel [facility] comes to [facility], referred  through the workman's comp system.  He is a 65 year old involved in a  work-related incident, sustaining knee injury, with original date of  injury in 2003, so total knee replacement, followed by redo, persistent  pain, been evaluated by orthopedic colleagues, not felt to be a surgical  candidate, here for considerations of pain management, accompanied with  case manager and wife to the room after exam with his permission, 8/10  subjective scale difficulty with endurance and range of motion  activities.  He uses a cane to help with his ambulation primarily left  leg pain, with posterior calf pain, interfering with endurance,  inability to climb, shop and many of his normal activities of daily  living.  He has had essentially disabling pain and there is question  whether he will be able to return to work, or vocationally retrain, 14-  point review of systems.  PMH were remarkable for some swelling, but  does not describe classic pseudomotor changes.  He has had bone scan, x-  rays, MRI, and nerve conduction studies, reviewed.   PAST MEDICAL HISTORY:  He states, he is otherwise healthy.  He is  diabetic.   SOCIAL HISTORY:  He is married.   FAMILY HISTORY:  Diabetes.   REVIEW OF SYSTEMS, FAMILY, AND SOCIAL HISTORY:  Otherwise  noncontributory to pain problem.   PHYSICAL EXAMINATION:  GENERAL:  A pleasant male sitting comfortably in  bed, gait with a limp affect, appearance is normal, and oriented x3.  HEENT:  Otherwise unremarkable.  CHEST:  Clear to auscultation and percussion.  HEART:  Regular rate and rhythm without rub, murmur, or gallop.  ABDOMEN:  Soft, nontender, benign.  No hepatosplenomegaly.  MUSCULOSKELETAL:  He has diffuse paralumbar myofascial discomfort.  Fortin test positive.  Primarily myofascial pain.  His left knee has a  well-healed incision without edema.  He has evidence of pseudomotor  issues and  good distal pulses and vascular integrity.  He has full range  of motion, but some pain with full extension.  He has some pain to the  posterior calf, no cord.  He has not described this as a vascular  entity.  I do not see anything new from a neurological perspective.   IMPRESSION:  Persistent knee pain, status post total knee replacement.   PLAN:  1. Recommend TENS technology.  2. He is on topicals.  He is not really doing much, so we go ahead and      stop those.  We will put him on Ryzolt, as pharmacokinetically      smoother agent than what he is taking, that being Ultram p.r.n.  I      have reviewed his medications, allergies attached to chart.  I will      also give him some Roxicodone for breakthrough, if he does have      breakthrough.  He has tried hydrocodone in the past, this causes      pruritus, and I think this is probably a good choice to be used      sparingly.  3. We will see how he is doing and followup with him, determine      further course of care.  He is forthright and engaging, I      anticipate he will do fairly well.  I would like to also add Lyrica      at bedtime to help with restore the sleep capacity and  some vague      symptoms of peripheral neuropathy, it is unspecified, probably,      also a little bit of diabetic undertone here.   Discharge instruction given.  No barrier to communication.           ______________________________  Celene Kras, MD     HH/MedQ  D:  08/04/2008 14:42:34  T:  08/05/2008 02:53:29  Job #:  [id number]

## 2010-09-30 NOTE — H&P (Signed)
[place]  Patient:    Joel Jones, Joel [name] Visit Number: [id number] MRN: [id number]          Service Type: Attending:  Carlisle Beers. Dorothyann Gibbs, M.D. Dictated by:   Arnoldo Morale, P.A. Adm. Date:  08/05/01                           History and Physical  DATE OF BIRTH:  Jan 29, 1946  CHIEF COMPLAINT:  Progressively-worsening left knee pain for the past year.  HISTORY OF PRESENT ILLNESS:  This 65 year old white male patient presented to Dr. Priscille Kluver with a history of a work injury to his left knee on March 23, 2000.  He did have a torn medial meniscus and underwent left knee arthroscopy on May 16, 2000.  He did well initially after surgery and was able to go back to work for two to three months, when he started having progressively worsening left knee pain.  At this point he has had no new injury to his knee.  No other surgery other than the left knee scope.  The pain in his knee is a constant burning type of pain which becomes sharp at times.  It is located diffusely about the joint with some radiation into the mid tibia.  The pain does increase with any weightbearing and decreases with rest.  The knee gives way at times and pops. It also swells.  He complains that the knee feels like someone is stabbing an icepick through it at times.  The pain does keep him up at night and he does have to keep the knee flexed in order to sleep.  He is not currently walking with any assistive devices but does have a limp.  He is currently taking Vioxx for pain and that provides a moderate amount of relief.  ALLERGIES:  CODEINE causes edema.  CURRENT MEDICATIONS: 1. Vioxx 25 mg one tablet p.o. q.d. 2. Triple Flex two tablets p.o. q.d. 3. Centrum 21 one tablet p.o. q.d. 4. Zantac 75 mg one tablet p.o. q.d. p.r.n. reflux.  PAST MEDICAL HISTORY:   He has had a hiatal hernia for the last eight to ten years.  He does complain of occasional reflux.  He denies any  diabetes mellitus, hypertension, thyroid disease, peptic ulcer disease, heart disease, asthma, or any other chronic medical condition other than noted previously.  PAST SURGICAL HISTORY: 1. Left knee arthroscopy - May 16, 2000 by Dr. Jonny Ruiz L. Rendall. 2. Open reduction internal fixation of a fracture to the left long finger in    1990 by Dr. Josephine Igo. 3. Herniorrhaphy at age 39.  SOCIAL HISTORY:  He has a 10 pack-year history of cigarette smoking which he quit 20 years ago.  He does not drink any alcohol nor use any drugs.  He is married and lives with his wife in a one-story house with two steps into the main entrance that he has now built a ramp for his use.  He does work [employer] and is currently working.  His medical doctor is located at [facility] and their phone number is [phone].  His doctor was Dr. Milus Glazier but he reported that he has since left the practice.  He and his wife have three children.  FAMILY HISTORY:  His mother is alive at age 29 with osteoarthritis.  His father is alive at age 36 with a history of prostate cancer and diabetes mellitus.  He has four brothers age 75, 56, 76, and 24 and they are all healthy, and one sister age 35 who is healthy.  His children are age 19, 33, and 7 - two elder sons and one daughter - and they are all alive and healthy.  REVIEW OF SYSTEMS:   He was treated for an upper respiratory infection/sinus infection last month with some antibiotics but he has had no problems since then.  He does have some problems with spring allergies.  He has some degenerative disk disease in the lumbar spine.  He does wear glasses and he does have dentures on both the upper and lower jaw line.  All other systems are negative and noncontributory.  PHYSICAL EXAMINATION:  GENERAL:  Well-developed, well-nourished, overweight white male in no acute distress.  He walks with a significant limp on the left.  Mood and affect  are appropriate.  Accompanied by his wife.  Height 5 feet 9 inches, weight 207 pounds, and BMI is 29.5.  VITAL SIGNS:  Temperature 98.6 degrees Fahrenheit, pulse 60, respirations 18, and blood pressure 140/82.  HEENT:  Normocephalic, atraumatic, without frontal or maxillary sinus tenderness to palpation.  He does have a gray beard and mustache on his face. Conjunctivae are pink, sclerae anicteric.  PERRLA.  EOMs intact. No visible external ear deformities.  Hearing grossly intact.   Moderate amount of old brown cerumen noted in both ear canals.  Nose and nasal septum midline.  Nasal mucosa pink and moist without exudates or polyps noted. Buccal mucosa pink and moist.  Good dentition.  Pharynx without erythema or exudates.  Tongue and uvula midline.  Tongue without vesiculations and uvula rises equally with phonation.  NECK:  No visible masses or lesions noted.  Trachea midline.  No palpable lymphadenopathy nor thyromegaly.  Carotids +2 bilaterally without bruits. Full range of motion and nontender to palpation along the cervical spine.  CARDIOVASCULAR:  Heart rate and rhythm regular.  S1, S2 present without rubs, clicks, or murmurs noted.  RESPIRATORY:  Respirations even and unlabored.  Breath sounds clear to auscultation bilaterally without rales or wheezes noted.  ABDOMEN:  Rounded abdominal contour.  Bowel sounds present x4 quadrants. Soft, nontender to palpation, without hepatosplenomegaly nor CVA tenderness. Femoral pulses +1 bilaterally.  Nontender to palpation along the entire length of the vertebral column.  BREAST/GENITOURINARY/RECTAL:  These exams deferred at this time.  MUSCULOSKELETAL:  No obvious deformities bilateral upper extremities without pain.  Normal muscle tone and bulk.  Full range of motion of shoulders, elbows, wrists, and fingers.  Radial pulses +2 bilaterally.  He has full range of motion of his hips, ankles, and toes bilaterally.  DP and PT pulses are  +2. No pedal edema.  Right knee had no obvious deformity.  Full extension and  flexion to 125 degrees but a moderate amount of crepitus with range of motion. There is no effusion in the knee.  Slight pain with palpation along the medial joint line.  Collateral ligaments are stable.  No obvious deformity of the left knee.  Skin intact without erythema or ecchymosis.  Full extension and flexion to 115 degrees.  No crepitus with range of motion.  He is acutely tender to palpation along the lateral joint line, much more so than the medial.  There is a +1-2 effusion.  Collateral ligaments are stable.  NEUROLOGIC:  Alert and oriented x 3.  Cranial nerves 2-12 are grossly intact. Strength 5/5 bilateral upper and lower extremities.  Rapid alternating movements  intact.  Deep tendon reflexes 2+ bilateral upper and lower extremities.  Sensation intact to light touch.  RADIOLOGIC FINDINGS:  X-rays taken in February 2003 showed bone-against-bone contact in the medial compartment of the left knee with an OCD defect in the medial femoral condyle.  MRI taken in November 2002 showed a patellofemoral and femorotibial chondromalacia with an osteochondral defect involving the medial femoral condyle and medial tibial plateau with adjacent fibrovascular reaction.  It also appeared he had a re-tear of the posterior horn of the medial meniscus remnant.  IMPRESSION: 1. End-stage osteoarthritis, left knee. 2. Hiatal hernia. 3. Mild reflux.  PLAN:  Mr. Seim will be admitted to [facility] on August 05, 2001 where he will undergo a left total knee arthroplasty by Dr. Jonny Ruiz L. Rendall.  He will undergo all the routine preoperative laboratory tests and studies prior to this procedure. Dictated by:   Arnoldo Morale, P.A. Attending:  Carlisle Beers. Dorothyann Gibbs, M.D. DD:  07/30/01 TD:  07/31/01 Job: [id number]

## 2010-09-30 NOTE — Cardiovascular Report (Signed)
NAME:  Joel Jones, Joel Jones                            ACCOUNT NO.:  [id number]   MEDICAL RECORD NO.:  [id number]                   PATIENT TYPE:  OIB   LOCATION:  2854                                 FACILITY:  [facility]   PHYSICIAN:  Veneda Melter, M.D.                   DATE OF BIRTH:  02/14/1946   DATE OF PROCEDURE:  10/03/2002  DATE OF DISCHARGE:                              CARDIAC CATHETERIZATION   PROCEDURES PERFORMED:  1. Left heart catheterization.  2. Left ventriculogram.  3. Selective coronary angiography.  4. Abdominal aortogram.   DIAGNOSES:  1. Trivial coronary artery disease by angiogram.  2. Normal left ventricular systolic function.   HISTORY:  The patient is a 65 year old gentleman who presents with a  crescendo fatigue over the last several months.  The patient has had rare  episodes of left-sided discomfort most notable after climbing four flights  of stairs.  He has also complained of some right lower extremity discomfort  involving his knee.  Ankle branchial indexes were within normal limits.  He  underwent stress imaging study suggesting mild LV dilatation, although  perfusion appeared adequate.  Due to continued symptoms and slightly  abnormal Cardiolite, we elected to proceed with further cardiac assessment.   TECHNIQUE:  Informed consent was obtained.  The patient was brought to the  catheterization laboratory.  A 6-French sheath placed in the right femoral  artery using modified Seldinger technique.  A 6-French JL4 and JR4 catheter  was then used to engage the left and right coronary arteries and selective  angiography performed in various projections using __________ contrast.  A 6-  French pigtail catheter was advanced in the left ventricle.  Left  ventriculogram performed using power injections of contrast.  Pigtail  catheter was brought back in the abdominal aorta and abdominal aortogram  performed using power injections of contrast.  At the termination of  case  catheter and sheath were removed.  Manual pressure applied until adequate  hemostasis achieved.  The patient tolerated procedure well and was  transferred to the floor in stable condition.   FINDINGS:  1. Left main trunk:  A large caliber vessel, angiographically normal.  2. LAD:  This is a large caliber vessel provides two small diagonal     branches.  The mid section of the LAD has luminal disease of 10%.  3. Left circumflex artery:  This is a medium caliber vessel provides three     small marginal branches in the mid section.  Left circumflex system has     luminal irregularities.  4. Right coronary:  Dominant medium caliber vessel provides posterior     descending artery and several small posteroventricular branches in the     terminal segment.  The right coronary artery has luminal irregularities     in the mid section of 10-20%.  5. LV:  Normal end-systolic and end-diastolic  dimensions.  Overall left     ventricular function is low normal.  Ejection fraction approximately 50%.     There is no mitral regurgitation.  LV pressure 120/110.  Aortic is     120/60.  LVEDP equal 15.  Heart rate is approximately 45 beats per     minute, in sinus rhythm.  6. Abdominal aorta:  Normal caliber.  There is mild __________ stenosis or     dilatation.  Renal arteries are singly widely patent bilaterally.  Iliac     arteries are widely patent with only luminal irregularities.  The     proximal segment of the right superficial femoral artery is widely     patent, is of normal caliber.   ASSESSMENT AND PLAN:  The patient is a 65 year old gentleman with trivial  coronary artery disease and well preserved left ventricular function.  Continued medical therapy will be pursued.  The patient has significant  bradycardia and further assessment with a Holter monitor will be undertaken  to determine if he has symptomatic bradycardia requiring permanent  pacemaker.  He has no evidence of significant  vascular compromise to the  large vessels of lower extremities and thus I believe that his right lower  extremity discomfort is most probably arthritis in nature.                                                Veneda Melter, M.D.    NG/MEDQ  D:  10/03/2002  T:  10/03/2002  Job:  [id number]   cc:   Clydie Braun L. Hal Hope, M.D.  [address] [place]  [place] [address]  Fax: [phone]

## 2010-09-30 NOTE — Op Note (Signed)
[place]. [facility]  Patient:    Joel Jones, Joel Jones Visit Number: [id number] MRN: [id number]          Service Type: SUR Location: [id number] Attending Physician:  Carolan Shiver Ii Dictated by:   Carlisle Beers. Dorothyann Gibbs, M.D. Proc. Date: 08/05/01 Admit Date:  08/05/2001                             Operative Report  INDICATION AND JUSTIFICATION FOR PROCEDURE:  Chronic osteoarthritis left knee resistant to conservative measures with evidence of collapse of the medial femoral condyle.  PREOPERATIVE DIAGNOSIS:  Traumatic osteoarthritis left knee.  SURGICAL PROCEDURE:  Left LCS total knee replacement fully cemented.  POSTOPERATIVE DIAGNOSIS:  Traumatic osteoarthritis left knee.  SURGEON:  John L. Rendall III, M.D.  ASSISTANT:  Jamelle Rushing, P.A.-C.  ANESTHESIA:  General.  PATHOLOGY:  The patient has medial compartment and patellofemoral osteoarthritis with significant erosion on the margin of the medial femoral condyle and weight bearing areas plus spurs on the proximal tibia.  PROCEDURE:  Under general anesthesia the left leg was prepared with Betadine and draped as a sterile field with the leg holder.  A standard tourniquet was applied to the proximal thigh.  The leg was wrapped out with the Esmarch and the tourniquet was used to 350 mmHg.  Midline incision was made, deep incision medial parapatellar made.  The patella is everted and multiple small vessels are cauterized.  The femur sized to large.  The proximal tibial resection is then carried out using tibial guide.  Using the first femoral guide, an intercondylar drill hole is placed.  Using the second guide, the anterior and posterior flare of the distal femur were resected with 10 mm balanced flexion gap.  The intermedullary guide is then used and the distal femoral cut was then made for 10 mm extension gap.  Recessing guide was then used.  Following this, the lamina spreader is inserted and  spurs were taken off the back of the femoral condyles and remnants of the menisci and cruciates are resected.  Following this, the tibia is sized at a size #4, center peg hole with key is placed.  Trial components are then inserted.  Size #4 tibia 10 bearing and large femur and three pegs were placed in the patella and trial patella is tried.  Excellent fit, alignment, stability are obtained.  The permanent components are then cemented in place using Dupuy #1 cement. Following this, excess cement is removed.  The tourniquet is let down at 58 minutes.  Multiple small vessels are cauterized.  A medium Hemovac drain is inserted and the fascia is then closed with #1 Surgidac, subcutaneous with 1-0 and 2-0 Vicryl and the skin with clips.  Total operative time approximately one hour and twenty minutes.  The patient tolerated the procedure well and returned to the recovery room in good condition. Dictated by:   Carlisle Beers. Dorothyann Gibbs, M.D. Attending Physician:  Carolan Shiver Ii DD:  08/05/01 TD:  08/06/01 Job: [id number]

## 2010-09-30 NOTE — Assessment & Plan Note (Signed)
Joel Jones                            CARDIOLOGY OFFICE NOTE   BASHIR, MARCHETTI                         MRN:          [id number]  DATE:09/07/2006                            DOB:          04/07/1946    CARDIOLOGY CONSULTATION NOTE   I was asked by Dr. Jonny Ruiz L. Rendall to clear Joel Jones for left total  knee surgery.   Joel Jones is a delightful 65 year old white male whom we have not seen or  evaluated since 2004.  At that time, he had some global hypokinesia with  an EF of 50% with a mildly dilated LV by nuclear stress study.   He underwent cardiac catheterization, which showed trivial coronary  artery disease.  His EF was 50%.  This study dated Loser 21, 2004.   He has always had a significant sinus bradycardia without any AV nodal  blocking drugs.   He is still active, working, particularly in [employer].  He climbs a  lot of hills.  He is having no angina.  He does get mildly short of  breath, but this is really baseline.   CARDIAC RISK FACTORS:  Diabetes for about 4 years.  Mixed  hyperlipidemia, and I notice he is not on a Statin any longer (but he  says Lipitor made him ache all over).  Question some mild hypertension  on Aceon.   He does not drink or smoke.   PAST MEDICAL HISTORY:  1. His current meds are multivitamin daily.  2. Aspirin 81 mg a day.  3. Metformin 1000 b.i.d.  4. Actos 30 mg a day.  5. Aceon 4 mg a day.  6. Hydrocodone APAP 5/500 mg 1 b.i.d.  7. Celebrex 200 mg a day.  8. Furosemide 20 mg a day.   He has intolerance to LIPITOR, CODEINE, and, OXYCONTIN.   He has had a left knee replacement on August 05, 2001.  He has had a left  third finger fracture.  He has not smoked in nearly 30 years.  He was a  heavy smoker at 1 time.   SOCIAL HISTORY:  He is in [employer].  He is married and has 3  children.   FAMILY HISTORY:  Really noncontributory.   REVIEW OF SYSTEMS:  Other than the HPI, negative times all  systems  reviewed.   PHYSICAL EXAM:  His blood pressure today is 121/67, pulse is 49 and  regular.  His EKG confirms sinus brady.  Weight is 205.  HEENT:  Normocephalic, atraumatic.  PERRLA.  Extraocular muscles are  intact.  Sclerae clear.  Facial symmetry is normal.  PERRLA.  Carotid upstrokes are equal bilaterally without bruits.  There is no  JVD.  NECK:  Supple.  Thyroid is not enlarged.  Trachea is midline.  LUNGS:  Clear.  HEART:  Nondisplaced PMI.  He has normal S1, S2 with a slow rate.  ABDOMEN:  Soft with good bowel sounds.  No midline bruit.  No  hepatomegaly.  Bowel sounds are present.  EXTREMITIES:  No edema.  Pulses were brisk.  There is no  varicosities.  No edema.  SKIN:  Intact and unremarkable.  NEURO:  Intact.   ASSESSMENT:  1. History of nonobstructive coronary disease with mild reduction in      left ventricular function.  He is totally asymptomatic, except for      some mild dyspnea on exertion.  2. Mixed hyperlipidemia, no longer on a lipid-lowering drug.  This is      a serious handicap, as I have told him today.  3. Borderline hypertension.  4. Need for left knee replacement once cleared by [place].   PLAN:  1. I have given surgical clearance and faxed a form to Dr. Priscille Kluver.  2. Begin Crestor 10 mg p.o. daily with followup lipids and LFTs in 6      weeks.  If he tolerates it, he will get his prescription filled.      This is a really important intervention for him.  3. Continue aspirin, Aceon, and tight blood sugar control.  4. Follow up with me again in about 8 weeks.   At some point, we [place] want to do another stress test.  However, at this  point in time, I do not think it is necessary.     Thomas C. Daleen Squibb, MD, [facility]  Electronically Signed    TCW/MedQ  DD: 09/07/2006  DT: 09/07/2006  Job #: [id number]   cc:   Jonny Ruiz L. Rendall, M.D.

## 2011-02-03 LAB — COMPREHENSIVE METABOLIC PANEL
Alkaline Phosphatase: 69
BUN: 16
CO2: 28
GFR calc non Af Amer: >60
Total Protein: 6.6

## 2011-02-03 LAB — CBC
HCT: 33.4 — ABNORMAL LOW
HCT: 39.4
MCHC: 34.6
MCHC: 34.9
MCV: 91
Platelets: 206
RBC: 4.35
RDW: 12.6
RDW: 12.7
WBC: 6.2
WBC: 7

## 2011-02-03 LAB — BASIC METABOLIC PANEL
BUN: 11
CO2: 28
Chloride: 105
Creatinine, Ser: 0.77
Glucose, Bld: 142 — ABNORMAL HIGH
Potassium: 3.6

## 2011-02-03 LAB — TYPE AND SCREEN
ABO/RH(D): O POS
Antibody Screen: NEGATIVE

## 2011-02-03 LAB — URINE CULTURE: Colony Count: NO GROWTH

## 2011-02-03 LAB — PROTIME-INR: INR: 1

## 2011-02-03 LAB — DIFFERENTIAL
Basophils Relative: 1
Eosinophils Absolute: 0.1
Monocytes Absolute: 0.7
Neutrophils Relative %: 50

## 2011-02-03 LAB — HEMOGLOBIN A1C: Mean Plasma Glucose: 168

## 2011-02-03 LAB — URINALYSIS, ROUTINE W REFLEX MICROSCOPIC
Bilirubin Urine: NEGATIVE
Hgb urine dipstick: NEGATIVE
Ketones, ur: NEGATIVE
Nitrite: NEGATIVE
Urobilinogen, UA: 0.2
pH: 5.5

## 2011-02-03 LAB — SEDIMENTATION RATE: Sed Rate: 1

## 2011-02-03 LAB — APTT: aPTT: 28

## 2011-03-01 LAB — CBC
HCT: 24.2 — ABNORMAL LOW
HCT: 28.9 — ABNORMAL LOW
Hemoglobin: 8.6 — ABNORMAL LOW
Hemoglobin: 9.9 — ABNORMAL LOW
MCHC: 34.3
MCHC: 34.5
MCHC: 35.7
MCV: 90.2
Platelets: 143 — ABNORMAL LOW
Platelets: 170
RBC: 3.18 — ABNORMAL LOW
RDW: 12.9
RDW: 13.3
RDW: 13.6

## 2011-03-01 LAB — ANAEROBIC CULTURE

## 2011-03-01 LAB — CROSSMATCH: ABO/RH(D): O POS

## 2011-03-01 LAB — BASIC METABOLIC PANEL
BUN: 8
CO2: 28
CO2: 30
Calcium: 7.9 — ABNORMAL LOW
GFR calc Af Amer: >60
GFR calc non Af Amer: >60
Glucose, Bld: 133 — ABNORMAL HIGH
Glucose, Bld: 156 — ABNORMAL HIGH
Potassium: 3.8
Potassium: 3.9
Sodium: 135
Sodium: 136

## 2011-03-01 LAB — GRAM STAIN

## 2011-03-01 LAB — WOUND CULTURE

## 2011-03-02 LAB — URINE CULTURE

## 2011-03-02 LAB — CBC
HCT: 38.7 — ABNORMAL LOW
Hemoglobin: 13.1
MCV: 90.5
RDW: 13.3

## 2011-03-02 LAB — DIFFERENTIAL
Basophils Relative: 1
Eosinophils Absolute: 0.1
Lymphs Abs: 2.1
Neutrophils Relative %: 50

## 2011-03-02 LAB — URINALYSIS, ROUTINE W REFLEX MICROSCOPIC
Hgb urine dipstick: NEGATIVE
Nitrite: NEGATIVE
Protein, ur: NEGATIVE
Specific Gravity, Urine: 1.024
Urobilinogen, UA: 0.2

## 2011-03-02 LAB — PROTIME-INR
INR: 1
Prothrombin Time: 12.8

## 2011-03-02 LAB — COMPREHENSIVE METABOLIC PANEL
Alkaline Phosphatase: 61
BUN: 32 — ABNORMAL HIGH
Creatinine, Ser: 1
Glucose, Bld: 157 — ABNORMAL HIGH
Potassium: 4
Total Bilirubin: 0.8
Total Protein: 6.6

## 2011-03-02 LAB — HIGH SENSITIVITY CRP: CRP, High Sensitivity: 0.7

## 2011-03-02 LAB — SEDIMENTATION RATE: Sed Rate: 5

## 2011-05-23 DIAGNOSIS — N181 Chronic kidney disease, stage 1: Secondary | ICD-10-CM | POA: Diagnosis not present

## 2011-05-23 DIAGNOSIS — E1165 Type 2 diabetes mellitus with hyperglycemia: Secondary | ICD-10-CM | POA: Diagnosis not present

## 2011-05-23 DIAGNOSIS — E663 Overweight: Secondary | ICD-10-CM | POA: Diagnosis not present

## 2011-06-20 DIAGNOSIS — E669 Obesity, unspecified: Secondary | ICD-10-CM | POA: Diagnosis not present

## 2011-06-20 DIAGNOSIS — E1165 Type 2 diabetes mellitus with hyperglycemia: Secondary | ICD-10-CM | POA: Diagnosis not present

## 2011-06-20 DIAGNOSIS — N181 Chronic kidney disease, stage 1: Secondary | ICD-10-CM | POA: Diagnosis not present

## 2011-06-20 DIAGNOSIS — E1129 Type 2 diabetes mellitus with other diabetic kidney complication: Secondary | ICD-10-CM | POA: Diagnosis not present

## 2011-08-28 DIAGNOSIS — E669 Obesity, unspecified: Secondary | ICD-10-CM | POA: Diagnosis not present

## 2011-08-28 DIAGNOSIS — N181 Chronic kidney disease, stage 1: Secondary | ICD-10-CM | POA: Diagnosis not present

## 2011-08-28 DIAGNOSIS — E1165 Type 2 diabetes mellitus with hyperglycemia: Secondary | ICD-10-CM | POA: Diagnosis not present

## 2011-10-25 DIAGNOSIS — E1129 Type 2 diabetes mellitus with other diabetic kidney complication: Secondary | ICD-10-CM | POA: Diagnosis not present

## 2011-10-25 DIAGNOSIS — E669 Obesity, unspecified: Secondary | ICD-10-CM | POA: Diagnosis not present

## 2011-10-25 DIAGNOSIS — N181 Chronic kidney disease, stage 1: Secondary | ICD-10-CM | POA: Diagnosis not present

## 2011-11-28 DIAGNOSIS — E1129 Type 2 diabetes mellitus with other diabetic kidney complication: Secondary | ICD-10-CM | POA: Diagnosis not present

## 2011-11-28 DIAGNOSIS — E1165 Type 2 diabetes mellitus with hyperglycemia: Secondary | ICD-10-CM | POA: Diagnosis not present

## 2011-11-28 DIAGNOSIS — N181 Chronic kidney disease, stage 1: Secondary | ICD-10-CM | POA: Diagnosis not present

## 2011-11-30 DIAGNOSIS — E1129 Type 2 diabetes mellitus with other diabetic kidney complication: Secondary | ICD-10-CM | POA: Diagnosis not present

## 2011-11-30 DIAGNOSIS — E669 Obesity, unspecified: Secondary | ICD-10-CM | POA: Diagnosis not present

## 2011-11-30 DIAGNOSIS — N181 Chronic kidney disease, stage 1: Secondary | ICD-10-CM | POA: Diagnosis not present

## 2012-02-08 DIAGNOSIS — E669 Obesity, unspecified: Secondary | ICD-10-CM | POA: Diagnosis not present

## 2012-02-08 DIAGNOSIS — N181 Chronic kidney disease, stage 1: Secondary | ICD-10-CM | POA: Diagnosis not present

## 2012-02-08 DIAGNOSIS — E1129 Type 2 diabetes mellitus with other diabetic kidney complication: Secondary | ICD-10-CM | POA: Diagnosis not present

## 2012-02-26 DIAGNOSIS — J019 Acute sinusitis, unspecified: Secondary | ICD-10-CM | POA: Diagnosis not present

## 2012-02-26 DIAGNOSIS — G8929 Other chronic pain: Secondary | ICD-10-CM | POA: Diagnosis not present

## 2012-02-26 DIAGNOSIS — R42 Dizziness and giddiness: Secondary | ICD-10-CM | POA: Diagnosis not present

## 2012-02-26 DIAGNOSIS — Z125 Encounter for screening for malignant neoplasm of prostate: Secondary | ICD-10-CM | POA: Diagnosis not present

## 2012-02-26 DIAGNOSIS — E785 Hyperlipidemia, unspecified: Secondary | ICD-10-CM | POA: Diagnosis not present

## 2012-02-26 DIAGNOSIS — R1011 Right upper quadrant pain: Secondary | ICD-10-CM | POA: Diagnosis not present

## 2012-02-26 DIAGNOSIS — E1129 Type 2 diabetes mellitus with other diabetic kidney complication: Secondary | ICD-10-CM | POA: Diagnosis not present

## 2012-02-26 DIAGNOSIS — I10 Essential (primary) hypertension: Secondary | ICD-10-CM | POA: Diagnosis not present

## 2012-03-07 ENCOUNTER — Other Ambulatory Visit: Payer: Self-pay | Admitting: Family Medicine

## 2012-03-07 DIAGNOSIS — R1011 Right upper quadrant pain: Secondary | ICD-10-CM

## 2012-03-07 DIAGNOSIS — R42 Dizziness and giddiness: Secondary | ICD-10-CM

## 2012-03-12 ENCOUNTER — Ambulatory Visit
Admission: RE | Admit: 2012-03-12 | Discharge: 2012-03-12 | Disposition: A | Discharge status: Home / Self Care | Payer: Medicare Other | Source: Ambulatory Visit | Attending: Family Medicine | Admitting: Family Medicine

## 2012-03-12 DIAGNOSIS — R42 Dizziness and giddiness: Secondary | ICD-10-CM

## 2012-03-12 DIAGNOSIS — I658 Occlusion and stenosis of other precerebral arteries: Secondary | ICD-10-CM | POA: Diagnosis not present

## 2012-03-12 DIAGNOSIS — R1011 Right upper quadrant pain: Secondary | ICD-10-CM | POA: Diagnosis not present

## 2012-03-13 DIAGNOSIS — R1011 Right upper quadrant pain: Secondary | ICD-10-CM | POA: Diagnosis not present

## 2012-03-13 DIAGNOSIS — Z1211 Encounter for screening for malignant neoplasm of colon: Secondary | ICD-10-CM | POA: Diagnosis not present

## 2012-03-21 ENCOUNTER — Encounter (HOSPITAL_COMMUNITY): Payer: Self-pay | Admitting: Nurse Practitioner

## 2012-03-21 ENCOUNTER — Emergency Department (HOSPITAL_COMMUNITY): Payer: PRIVATE HEALTH INSURANCE

## 2012-03-21 ENCOUNTER — Emergency Department (HOSPITAL_COMMUNITY)
Admission: EM | Admit: 2012-03-21 | Discharge: 2012-03-21 | Disposition: A | Discharge status: Home / Self Care | Payer: PRIVATE HEALTH INSURANCE | Attending: Emergency Medicine | Admitting: Emergency Medicine

## 2012-03-21 DIAGNOSIS — S139XXA Sprain of joints and ligaments of unspecified parts of neck, initial encounter: Secondary | ICD-10-CM | POA: Diagnosis not present

## 2012-03-21 DIAGNOSIS — M25519 Pain in unspecified shoulder: Secondary | ICD-10-CM

## 2012-03-21 DIAGNOSIS — S46909A Unspecified injury of unspecified muscle, fascia and tendon at shoulder and upper arm level, unspecified arm, initial encounter: Secondary | ICD-10-CM | POA: Insufficient documentation

## 2012-03-21 DIAGNOSIS — S4980XA Other specified injuries of shoulder and upper arm, unspecified arm, initial encounter: Secondary | ICD-10-CM | POA: Insufficient documentation

## 2012-03-21 DIAGNOSIS — Y93I9 Activity, other involving external motion: Secondary | ICD-10-CM | POA: Insufficient documentation

## 2012-03-21 DIAGNOSIS — Z888 Allergy status to other drugs, medicaments and biological substances status: Secondary | ICD-10-CM | POA: Insufficient documentation

## 2012-03-21 DIAGNOSIS — E119 Type 2 diabetes mellitus without complications: Secondary | ICD-10-CM | POA: Insufficient documentation

## 2012-03-21 DIAGNOSIS — R109 Unspecified abdominal pain: Secondary | ICD-10-CM | POA: Insufficient documentation

## 2012-03-21 DIAGNOSIS — S161XXA Strain of muscle, fascia and tendon at neck level, initial encounter: Secondary | ICD-10-CM

## 2012-03-21 DIAGNOSIS — Y92410 Unspecified street and highway as the place of occurrence of the external cause: Secondary | ICD-10-CM | POA: Insufficient documentation

## 2012-03-21 DIAGNOSIS — S0993XA Unspecified injury of face, initial encounter: Secondary | ICD-10-CM | POA: Insufficient documentation

## 2012-03-21 DIAGNOSIS — M542 Cervicalgia: Secondary | ICD-10-CM | POA: Insufficient documentation

## 2012-03-21 LAB — POCT I-STAT, CHEM 8
Creatinine, Ser: 1 mg/dL (ref 0.50–1.35)
HCT: 43 % (ref 39.0–52.0)
Hemoglobin: 14.6 g/dL (ref 13.0–17.0)
Sodium: 138 mEq/L (ref 135–145)
TCO2: 23 mmol/L (ref 0–100)

## 2012-03-21 MED ORDER — IOHEXOL 300 MG/ML  SOLN
80.0000 mL | Freq: Once | INTRAMUSCULAR | Status: AC | PRN
  Administered 2012-03-21: 80 mL via INTRAVENOUS
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
## 2012-04-02 ENCOUNTER — Encounter: Payer: Self-pay | Admitting: Cardiology

## 2012-04-02 ENCOUNTER — Ambulatory Visit (INDEPENDENT_AMBULATORY_CARE_PROVIDER_SITE_OTHER): Payer: Medicare Other | Admitting: Cardiology

## 2012-04-02 ENCOUNTER — Encounter: Payer: Self-pay | Admitting: *Deleted

## 2012-04-02 VITALS — BP 126/76 | HR 48 | Ht 66.5 in | Wt 200.0 lb

## 2012-04-02 DIAGNOSIS — E785 Hyperlipidemia, unspecified: Secondary | ICD-10-CM | POA: Insufficient documentation

## 2012-04-02 DIAGNOSIS — E119 Type 2 diabetes mellitus without complications: Secondary | ICD-10-CM | POA: Insufficient documentation

## 2012-04-02 DIAGNOSIS — R001 Bradycardia, unspecified: Secondary | ICD-10-CM | POA: Insufficient documentation

## 2012-04-02 DIAGNOSIS — I2510 Atherosclerotic heart disease of native coronary artery without angina pectoris: Secondary | ICD-10-CM | POA: Diagnosis not present

## 2012-04-02 DIAGNOSIS — R609 Edema, unspecified: Secondary | ICD-10-CM | POA: Insufficient documentation

## 2012-04-02 DIAGNOSIS — R079 Chest pain, unspecified: Secondary | ICD-10-CM | POA: Insufficient documentation

## 2012-04-02 DIAGNOSIS — R42 Dizziness and giddiness: Secondary | ICD-10-CM | POA: Insufficient documentation

## 2012-04-02 DIAGNOSIS — I679 Cerebrovascular disease, unspecified: Secondary | ICD-10-CM | POA: Diagnosis not present

## 2012-04-02 MED ORDER — PRAVASTATIN SODIUM 40 MG PO TABS: 40.0000 mg | ORAL_TABLET | Freq: Every evening | ORAL | Status: DC

## 2012-04-02 NOTE — Assessment & Plan Note (Signed)
Symptoms are atypical. However he had mild coronary disease by catheterization in 2004 and is diabetic. Schedule stress echocardiogram for risk stratification.

## 2012-04-02 NOTE — Patient Instructions (Addendum)
Your physician wants you to follow-up in: ONE YEAR WITH DR Shelda Pal will receive a reminder letter in the mail two months in advance. If you don't receive a letter, please call our office to schedule the follow-up appointment.   Your physician has requested that you have a stress echocardiogram. For further information please visit [email or web address] Please follow instruction sheet as given.   START PRAVASTATIN 40 MG ONCE DAILY AT BEDTIME  Your physician recommends that you return for lab work in: 6 WEEKS = FASTING  Your physician has requested that you have a carotid duplex. This test is an ultrasound of the carotid arteries in your neck. It looks at blood flow through these arteries that supply the brain with blood. Allow one hour for this exam. There are no restrictions or special instructions. DUE OCT 2014

## 2012-04-02 NOTE — Assessment & Plan Note (Signed)
Continue aspirin. Add Pravachol 40 mg daily. Check lipids and liver in 6 weeks. 

## 2012-04-02 NOTE — Assessment & Plan Note (Signed)
Continue aspirin and add statin. Followup carotid Dopplers October 2014.

## 2012-04-02 NOTE — Progress Notes (Signed)
HPI: 66 year old male for evaluation of chest pain. Patient apparently had a cardiac catheterization in 2004 that showed nonobstructive disease. I do not have those records available. His ejection fraction was 50% based on previous notes from Dr. Daleen Squibb. Patient has dyspnea with more extreme activities but not routine activities. No orthopnea, PND, pedal edema, syncope. Occasional pain in the left upper chest that increases with certain arm movements. No exertional chest pain. Patient had recent carotid Dopplers in October of 2013. There was less than 50% bilateral stenosis. Chest CT in November of 2013 showed scattered atherosclerotic calcifications in the aorta and coronary arteries.  Current Outpatient Prescriptions  Medication Sig Dispense Refill  . amLODipine (NORVASC) 5 MG tablet Take 5 mg by mouth daily.      . Ascorbic Acid (VITAMIN C) 1000 MG tablet Take 1,000 mg by mouth daily.      Marland Kitchen aspirin EC 81 MG tablet Take 81 mg by mouth daily.      . hydrochlorothiazide (MICROZIDE) 12.5 MG capsule Take 12.5 mg by mouth every morning.      . insulin glargine (LANTUS) 100 UNIT/ML injection Inject 38 Units into the skin daily.      Marland Kitchen KRILL OIL 1000 MG CAPS Take 1,000 mg by mouth daily.      . metFORMIN (GLUCOPHAGE) 1000 MG tablet Take 1,000 mg by mouth 2 (two) times daily with a meal.      . Multiple Vitamin (MULTIVITAMIN WITH MINERALS) TABS Take 1 tablet by mouth daily.      . pravastatin (PRAVACHOL) 40 MG tablet Take 40 mg by mouth daily.      . ramipril (ALTACE) 5 MG capsule Take 5 mg by mouth daily.      . STUDY MEDICATION Inject 18 mg as directed daily. Liraglutide or Placebo      . vitamin E 400 UNIT capsule Take 400 Units by mouth daily.        Allergies  Allergen Reactions  . Codeine Swelling    Past Medical History  Diagnosis Date  . Diabetes mellitus without complication   . Hyperlipidemia   . Hiatal hernia   . GERD (gastroesophageal reflux disease)     Past Surgical History    Procedure Date  . Hernia repair   . Knee surgery     Knee replacement    History   Social History  . Marital Status: Married    Spouse Name: N/A    Number of Children: 3  . Years of Education: N/A   Occupational History  .     Social History Main Topics  . Smoking status: Former Games developer  . Smokeless tobacco: Not on file  . Alcohol Use: No  . Drug Use: No  . Sexually Active: Not on file   Other Topics Concern  . Not on file   Social History Narrative  . No narrative on file    Family History  Problem Relation Age of Onset  . Heart disease      No family history    ROS: knee arthralgias but no fevers or chills, productive cough, hemoptysis, dysphasia, odynophagia, melena, hematochezia, dysuria, hematuria, rash, seizure activity, orthopnea, PND, pedal edema, claudication. Remaining systems are negative.  Physical Exam:   Blood pressure 126/76, pulse 48, height 5' 6.5" (1.689 m), weight 200 lb (90.719 kg).  General:  Well developed/well nourished in NAD Skin warm/dry Patient not depressed No peripheral clubbing Back-normal HEENT-normal/normal eyelids Neck supple/normal carotid upstroke bilaterally; no bruits; no JVD; no  thyromegaly chest - CTA/ normal expansion CV - RRR/normal S1 and S2; no murmurs, rubs or gallops;  PMI nondisplaced Abdomen -NT/ND, no HSM, no mass, + bowel sounds, no bruit 2+ femoral pulses, no bruits Ext-no edema, chords, 2+ DP Neuro-grossly nonfocal  ECG sinus bradycardia at a rate of 54. No ST changes.

## 2012-04-02 NOTE — Assessment & Plan Note (Signed)
Add Pravachol 40 mg daily. Check lipids and liver in 6 weeks. 

## 2012-04-05 DIAGNOSIS — Z1211 Encounter for screening for malignant neoplasm of colon: Secondary | ICD-10-CM | POA: Diagnosis not present

## 2012-04-05 LAB — HM COLONOSCOPY

## 2012-04-10 ENCOUNTER — Ambulatory Visit (HOSPITAL_BASED_OUTPATIENT_CLINIC_OR_DEPARTMENT_OTHER): Payer: Medicare Other

## 2012-04-10 ENCOUNTER — Ambulatory Visit (HOSPITAL_COMMUNITY): Payer: Medicare Other | Attending: Cardiology

## 2012-04-10 DIAGNOSIS — R072 Precordial pain: Secondary | ICD-10-CM | POA: Insufficient documentation

## 2012-04-10 DIAGNOSIS — E119 Type 2 diabetes mellitus without complications: Secondary | ICD-10-CM | POA: Diagnosis not present

## 2012-04-10 DIAGNOSIS — I2510 Atherosclerotic heart disease of native coronary artery without angina pectoris: Secondary | ICD-10-CM | POA: Insufficient documentation

## 2012-04-10 DIAGNOSIS — R079 Chest pain, unspecified: Secondary | ICD-10-CM

## 2012-04-10 DIAGNOSIS — R0989 Other specified symptoms and signs involving the circulatory and respiratory systems: Secondary | ICD-10-CM

## 2012-04-10 NOTE — Progress Notes (Signed)
Echocardiogram performed.  

## 2012-04-12 ENCOUNTER — Telehealth: Payer: Self-pay | Admitting: Cardiology

## 2012-04-12 NOTE — Telephone Encounter (Signed)
Spoke with pt, aware of normal stress echo results

## 2012-04-12 NOTE — Telephone Encounter (Signed)
New Problem: ° ° ° °Patient returned your call.  Please call back. °

## 2012-04-17 DIAGNOSIS — M25559 Pain in unspecified hip: Secondary | ICD-10-CM | POA: Diagnosis not present

## 2012-04-17 DIAGNOSIS — M25549 Pain in joints of unspecified hand: Secondary | ICD-10-CM | POA: Diagnosis not present

## 2012-04-17 DIAGNOSIS — S60229A Contusion of unspecified hand, initial encounter: Secondary | ICD-10-CM | POA: Diagnosis not present

## 2012-04-17 DIAGNOSIS — S300XXA Contusion of lower back and pelvis, initial encounter: Secondary | ICD-10-CM | POA: Diagnosis not present

## 2012-05-01 DIAGNOSIS — M25549 Pain in joints of unspecified hand: Secondary | ICD-10-CM | POA: Diagnosis not present

## 2012-05-13 DIAGNOSIS — E1129 Type 2 diabetes mellitus with other diabetic kidney complication: Secondary | ICD-10-CM | POA: Diagnosis not present

## 2012-05-13 DIAGNOSIS — N181 Chronic kidney disease, stage 1: Secondary | ICD-10-CM | POA: Diagnosis not present

## 2012-05-13 DIAGNOSIS — E669 Obesity, unspecified: Secondary | ICD-10-CM | POA: Diagnosis not present

## 2012-05-14 ENCOUNTER — Other Ambulatory Visit (INDEPENDENT_AMBULATORY_CARE_PROVIDER_SITE_OTHER): Payer: Medicare Other

## 2012-05-14 ENCOUNTER — Encounter: Payer: Self-pay | Admitting: *Deleted

## 2012-05-14 DIAGNOSIS — R079 Chest pain, unspecified: Secondary | ICD-10-CM | POA: Diagnosis not present

## 2012-05-14 DIAGNOSIS — I2510 Atherosclerotic heart disease of native coronary artery without angina pectoris: Secondary | ICD-10-CM | POA: Diagnosis not present

## 2012-05-14 LAB — HEPATIC FUNCTION PANEL
ALT: 22 U/L (ref 0–53)
AST: 21 U/L (ref 0–37)
Alkaline Phosphatase: 59 U/L (ref 39–117)
Total Bilirubin: 0.7 mg/dL (ref 0.3–1.2)

## 2012-05-14 LAB — LIPID PANEL
Total CHOL/HDL Ratio: 4
Triglycerides: 93 mg/dL (ref 0.0–149.0)

## 2012-05-22 ENCOUNTER — Telehealth: Payer: Self-pay | Admitting: Cardiology

## 2012-05-22 NOTE — Telephone Encounter (Signed)
Spoke with pt, aware pravastatin is best taken at night.

## 2012-05-22 NOTE — Telephone Encounter (Signed)
Pt was put on new cholesterol med and forgot if to take in the am or pm, pls call

## 2012-06-17 DIAGNOSIS — E669 Obesity, unspecified: Secondary | ICD-10-CM | POA: Diagnosis not present

## 2012-06-17 DIAGNOSIS — N181 Chronic kidney disease, stage 1: Secondary | ICD-10-CM | POA: Diagnosis not present

## 2012-06-17 DIAGNOSIS — E1129 Type 2 diabetes mellitus with other diabetic kidney complication: Secondary | ICD-10-CM | POA: Diagnosis not present

## 2012-08-12 DIAGNOSIS — J40 Bronchitis, not specified as acute or chronic: Secondary | ICD-10-CM | POA: Diagnosis not present

## 2012-08-15 ENCOUNTER — Ambulatory Visit (INDEPENDENT_AMBULATORY_CARE_PROVIDER_SITE_OTHER): Payer: Medicare Other | Admitting: General Surgery

## 2012-08-15 ENCOUNTER — Other Ambulatory Visit (INDEPENDENT_AMBULATORY_CARE_PROVIDER_SITE_OTHER): Payer: Self-pay | Admitting: General Surgery

## 2012-08-15 ENCOUNTER — Encounter (INDEPENDENT_AMBULATORY_CARE_PROVIDER_SITE_OTHER): Payer: Self-pay | Admitting: General Surgery

## 2012-08-15 ENCOUNTER — Telehealth (INDEPENDENT_AMBULATORY_CARE_PROVIDER_SITE_OTHER): Payer: Self-pay | Admitting: General Surgery

## 2012-08-15 VITALS — BP 152/84 | HR 76 | Temp 97.1°F | Resp 16 | Ht 66.0 in | Wt 192.0 lb

## 2012-08-15 DIAGNOSIS — R1031 Right lower quadrant pain: Secondary | ICD-10-CM

## 2012-08-15 NOTE — Patient Instructions (Signed)
Get CT scan.  Take 400 mg ibuprofen (2 advil) 3 times/day.  Follow up with Dr. Derrell Lolling in 2 weeks.  You Lorino find ice is helpful.

## 2012-08-15 NOTE — Telephone Encounter (Signed)
Patient is aware of ct scan appt  08/21/12 at 10:15 labs to be done on Monday 08/19/12 instructions for eating and drinking contrast given

## 2012-08-15 NOTE — Assessment & Plan Note (Signed)
I do not feel evidence of recurrent hernia, but pt had sudden pain with lifting.    Will order CT, do 2 weeks ibuprofen, and follow up with Dr. Derrell Lolling.

## 2012-08-15 NOTE — Progress Notes (Signed)
Chief Complaint  Patient presents with  . Re-evaluation    eval hernia repaired 2011 hurts with burning    HISTORY: Patient is a 67 year old male who I performed a right inguinal hernia repair on in 2011. It was laparoscopic converted to open and findings were pantaloon hernia. It was converted due to the difficulty reducing the hernia sac. He had a little but of swelling postoperatively but this resolved. He has been doing fine for the last 2-1/2 years. He was lifting and doing work outside around 2-3 weeks ago. He felt sudden pain in the right groin, and this doubled him over.  The pain has improved since then. However, it has not resolved. It is now a constant dull ache.  He denies any issues with constipation. He has not felt a bulge in the region.    Past Medical History  Diagnosis Date  . Diabetes mellitus without complication   . Hyperlipidemia   . Hiatal hernia   . GERD (gastroesophageal reflux disease)     Past Surgical History  Procedure Laterality Date  . Hernia repair    . Knee surgery      Knee replacement    Current Outpatient Prescriptions  Medication Sig Dispense Refill  . amLODipine (NORVASC) 5 MG tablet Take 5 mg by mouth daily.      . Ascorbic Acid (VITAMIN C) 1000 MG tablet Take 1,000 mg by mouth daily.      Marland Kitchen aspirin EC 81 MG tablet Take 81 mg by mouth daily.      . hydrochlorothiazide (MICROZIDE) 12.5 MG capsule Take 12.5 mg by mouth every morning.      . insulin glargine (LANTUS) 100 UNIT/ML injection Inject 38 Units into the skin daily.      Marland Kitchen KRILL OIL 1000 MG CAPS Take 1,000 mg by mouth daily.      Marland Kitchen LIRAGLUTIDE [place] Inject into the skin.      . metFORMIN (GLUCOPHAGE) 1000 MG tablet Take 1,000 mg by mouth 2 (two) times daily with a meal.      . Multiple Vitamin (MULTIVITAMIN WITH MINERALS) TABS Take 1 tablet by mouth daily.      . pravastatin (PRAVACHOL) 40 MG tablet Take 40 mg by mouth daily.      . pravastatin (PRAVACHOL) 40 MG tablet Take 1 tablet (40 mg  total) by mouth every evening.  90 tablet  3  . ramipril (ALTACE) 5 MG capsule Take 5 mg by mouth daily.      . STUDY MEDICATION Inject 18 mg as directed daily. Liraglutide or Placebo      . vitamin E 400 UNIT capsule Take 400 Units by mouth daily.       No current facility-administered medications for this visit.     Allergies  Allergen Reactions  . Codeine Swelling     Family History  Problem Relation Age of Onset  . Heart disease      No family history  . Cancer Father     prostate     History   Social History  . Marital Status: Married    Spouse Name: N/A    Number of Children: 3  . Years of Education: N/A   Occupational History  .     Social History Main Topics  . Smoking status: Former Games developer  . Smokeless tobacco: None  . Alcohol Use: No  . Drug Use: No  . Sexually Active: None   Other Topics Concern  . None   Social  History Narrative  . None     REVIEW OF SYSTEMS - PERTINENT POSITIVES ONLY: 12 point review of systems negative other than HPI and PMH  EXAM: Filed Vitals:   08/15/12 1020  BP: 152/84  Pulse: 76  Temp: 97.1 F (36.2 C)  Resp: 16    Gen:  No acute distress.  Well nourished and well groomed.   Neurological: Alert and oriented to person, place, and time. Coordination normal.  Head: Normocephalic and atraumatic.  Eyes: Conjunctivae are normal. Pupils are equal, round, and reactive to light. No scleral icterus.   Cardiovascular: Normal rate Respiratory: Effort normal.  No respiratory distress.  GI: Soft.  The abdomen is soft and nontender.  There is no rebound and no guarding.  There is tenderness in the right groin.  There is no palpable hernia defect.   Musculoskeletal: Antalgic gait.    Skin: Skin is warm and dry. No rash noted. No diaphoresis. No erythema. No pallor. No clubbing, cyanosis, or edema.   Psychiatric: Normal mood and affect. Behavior is normal. Judgment and thought content normal.     ASSESSMENT AND  PLAN: Right groin pain I do not feel evidence of recurrent hernia, but pt had sudden pain with lifting.    Will order CT, do 2 weeks ibuprofen, and follow up with Dr. Derrell Lolling.       Maudry Diego MD Surgical Oncology, General and Endocrine Surgery [facility]      Visit Diagnoses: 1. Right groin pain     Primary Care Physician: Cain Saupe, MD

## 2012-08-17 DIAGNOSIS — J209 Acute bronchitis, unspecified: Secondary | ICD-10-CM | POA: Diagnosis not present

## 2012-08-19 DIAGNOSIS — R1031 Right lower quadrant pain: Secondary | ICD-10-CM | POA: Diagnosis not present

## 2012-08-19 LAB — BASIC METABOLIC PANEL
CO2: 27 mEq/L (ref 19–32)
Calcium: 9 mg/dL (ref 8.4–10.5)
Creat: 1.1 mg/dL (ref 0.50–1.35)
Glucose, Bld: 206 mg/dL — ABNORMAL HIGH (ref 70–99)
Sodium: 133 mEq/L — ABNORMAL LOW (ref 135–145)

## 2012-08-21 ENCOUNTER — Ambulatory Visit
Admission: RE | Admit: 2012-08-21 | Discharge: 2012-08-21 | Disposition: A | Discharge status: Home / Self Care | Payer: Medicare Other | Source: Ambulatory Visit | Attending: General Surgery | Admitting: General Surgery

## 2012-08-21 DIAGNOSIS — R1031 Right lower quadrant pain: Secondary | ICD-10-CM

## 2012-08-21 DIAGNOSIS — R109 Unspecified abdominal pain: Secondary | ICD-10-CM | POA: Diagnosis not present

## 2012-08-21 MED ORDER — IOHEXOL 300 MG/ML  SOLN
100.0000 mL | Freq: Once | INTRAMUSCULAR | Status: AC | PRN
  Administered 2012-08-21: 100 mL via INTRAVENOUS

## 2012-08-27 ENCOUNTER — Ambulatory Visit (INDEPENDENT_AMBULATORY_CARE_PROVIDER_SITE_OTHER): Payer: Medicare Other | Admitting: General Surgery

## 2012-08-27 ENCOUNTER — Encounter (INDEPENDENT_AMBULATORY_CARE_PROVIDER_SITE_OTHER): Payer: Self-pay | Admitting: General Surgery

## 2012-08-27 VITALS — BP 126/76 | HR 68 | Temp 98.0°F | Resp 18 | Ht 66.0 in | Wt 194.8 lb

## 2012-08-27 DIAGNOSIS — R1031 Right lower quadrant pain: Secondary | ICD-10-CM | POA: Diagnosis not present

## 2012-08-27 NOTE — Progress Notes (Signed)
Patient ID: Joel Denning Mcnicholas Sr., male   DOB: 1946/02/11, 67 y.o.   MRN: [id number]  Chief Complaint  Patient presents with  . Establish Care    hernia    HPI Joel Kitagawa Hasty Sr. is a 67 y.o. male.  The patient is a 67 year old male who was seen previously in Clinic by Dr. Donell Beers previously for a right-sided inguinal hernia repair with mesh placement 01/2010.  CT scan a small right inguinal continue piece of fat. This could be related to recurrent inguinal hernia.  The patient complains of burning pain comes on the right side his left side suprapubically.  HPI  Past Medical History  Diagnosis Date  . Diabetes mellitus without complication   . Hyperlipidemia   . Hiatal hernia   . GERD (gastroesophageal reflux disease)     Past Surgical History  Procedure Laterality Date  . Hernia repair    . Knee surgery      Knee replacement    Family History  Problem Relation Age of Onset  . Heart disease      No family history  . Cancer Father     prostate    Social History History  Substance Use Topics  . Smoking status: Former Games developer  . Smokeless tobacco: Not on file  . Alcohol Use: No    Allergies  Allergen Reactions  . Codeine Swelling    Current Outpatient Prescriptions  Medication Sig Dispense Refill  . amLODipine (NORVASC) 5 MG tablet Take 5 mg by mouth daily.      . Ascorbic Acid (VITAMIN C) 1000 MG tablet Take 1,000 mg by mouth daily.      Marland Kitchen aspirin EC 81 MG tablet Take 81 mg by mouth daily.      . hydrochlorothiazide (MICROZIDE) 12.5 MG capsule Take 12.5 mg by mouth every morning.      . insulin glargine (LANTUS) 100 UNIT/ML injection Inject 38 Units into the skin daily.      Marland Kitchen KRILL OIL 1000 MG CAPS Take 1,000 mg by mouth daily.      Marland Kitchen LIRAGLUTIDE [name] Inject into the skin.      . metFORMIN (GLUCOPHAGE) 1000 MG tablet Take 1,000 mg by mouth 2 (two) times daily with a meal.      . Multiple Vitamin (MULTIVITAMIN WITH MINERALS) TABS Take 1 tablet by mouth daily.      .  pravastatin (PRAVACHOL) 40 MG tablet Take 40 mg by mouth daily.      . pravastatin (PRAVACHOL) 40 MG tablet Take 1 tablet (40 mg total) by mouth every evening.  90 tablet  3  . ramipril (ALTACE) 5 MG capsule Take 5 mg by mouth daily.      . STUDY MEDICATION Inject 18 mg as directed daily. Liraglutide or Placebo      . vitamin E 400 UNIT capsule Take 400 Units by mouth daily.       No current facility-administered medications for this visit.    Review of Systems Review of Systems  Constitutional: Negative.   HENT: Negative.   Respiratory: Negative.   Cardiovascular: Negative.   Gastrointestinal: Negative.   Genitourinary: Negative.   Neurological: Negative.     Blood pressure 126/76, pulse 68, temperature 98 F (36.7 C), resp. rate 18, height 5\' 6"  (1.676 m), weight 194 lb 12.8 oz (88.361 kg).  Physical Exam Physical Exam  Constitutional: He is oriented to person, place, and time. He appears well-developed.  HENT:  Head: Normocephalic and atraumatic.  Eyes:  Conjunctivae and EOM are normal. Pupils are equal, round, and reactive to light.  Neck: Normal range of motion. Neck supple.  Cardiovascular: Normal rate, regular rhythm and normal heart sounds.   Pulmonary/Chest: Effort normal and breath sounds normal.  Abdominal: Soft. Bowel sounds are normal. He exhibits no distension. There is no tenderness. There is no rebound and no guarding. Hernia confirmed negative in the right inguinal area.  No hernia on palpation  Musculoskeletal: Normal range of motion.  Neurological: He is alert and oriented to person, place, and time.    Data Reviewed CT was reviewed.  Procedure Percent Marcaine with epi was used to perform an ilioinguinal nerve block. 10 cc medication was used.  Assessment    67 year old male with right ilioinguinal nerve pain, and possible recurrent anal hernia.     Plan    1. We will have the patient follow up in 2 weeks to reexamine and reevaluate the patient's  pain.         Marigene Ehlers., Joel Jones 08/27/2012, 10:56 AM

## 2012-09-11 ENCOUNTER — Encounter (INDEPENDENT_AMBULATORY_CARE_PROVIDER_SITE_OTHER): Payer: Medicare Other | Admitting: General Surgery

## 2012-09-16 DIAGNOSIS — Z23 Encounter for immunization: Secondary | ICD-10-CM | POA: Diagnosis not present

## 2012-09-16 DIAGNOSIS — E669 Obesity, unspecified: Secondary | ICD-10-CM | POA: Diagnosis not present

## 2012-09-16 DIAGNOSIS — N181 Chronic kidney disease, stage 1: Secondary | ICD-10-CM | POA: Diagnosis not present

## 2012-09-16 DIAGNOSIS — R946 Abnormal results of thyroid function studies: Secondary | ICD-10-CM | POA: Diagnosis not present

## 2012-09-16 DIAGNOSIS — E1129 Type 2 diabetes mellitus with other diabetic kidney complication: Secondary | ICD-10-CM | POA: Diagnosis not present

## 2012-12-23 DIAGNOSIS — E038 Other specified hypothyroidism: Secondary | ICD-10-CM | POA: Diagnosis not present

## 2012-12-23 DIAGNOSIS — E669 Obesity, unspecified: Secondary | ICD-10-CM | POA: Diagnosis not present

## 2012-12-23 DIAGNOSIS — N181 Chronic kidney disease, stage 1: Secondary | ICD-10-CM | POA: Diagnosis not present

## 2012-12-23 DIAGNOSIS — E1129 Type 2 diabetes mellitus with other diabetic kidney complication: Secondary | ICD-10-CM | POA: Diagnosis not present

## 2013-01-28 DIAGNOSIS — J019 Acute sinusitis, unspecified: Secondary | ICD-10-CM | POA: Diagnosis not present

## 2013-01-28 DIAGNOSIS — H6690 Otitis media, unspecified, unspecified ear: Secondary | ICD-10-CM | POA: Diagnosis not present

## 2013-02-06 DIAGNOSIS — L989 Disorder of the skin and subcutaneous tissue, unspecified: Secondary | ICD-10-CM | POA: Diagnosis not present

## 2013-02-06 DIAGNOSIS — J069 Acute upper respiratory infection, unspecified: Secondary | ICD-10-CM | POA: Diagnosis not present

## 2013-02-24 ENCOUNTER — Other Ambulatory Visit: Payer: Self-pay | Admitting: Cardiology

## 2013-02-26 ENCOUNTER — Other Ambulatory Visit: Payer: Self-pay

## 2013-03-11 DIAGNOSIS — L820 Inflamed seborrheic keratosis: Secondary | ICD-10-CM | POA: Diagnosis not present

## 2013-03-31 DIAGNOSIS — E1129 Type 2 diabetes mellitus with other diabetic kidney complication: Secondary | ICD-10-CM | POA: Diagnosis not present

## 2013-03-31 DIAGNOSIS — E038 Other specified hypothyroidism: Secondary | ICD-10-CM | POA: Diagnosis not present

## 2013-03-31 DIAGNOSIS — E669 Obesity, unspecified: Secondary | ICD-10-CM | POA: Diagnosis not present

## 2013-03-31 DIAGNOSIS — N181 Chronic kidney disease, stage 1: Secondary | ICD-10-CM | POA: Diagnosis not present

## 2013-04-01 DIAGNOSIS — E119 Type 2 diabetes mellitus without complications: Secondary | ICD-10-CM | POA: Diagnosis not present

## 2013-04-11 ENCOUNTER — Encounter: Payer: Self-pay | Admitting: Cardiology

## 2013-04-11 ENCOUNTER — Ambulatory Visit (HOSPITAL_COMMUNITY): Payer: Medicare Other | Attending: Cardiovascular Disease

## 2013-04-11 DIAGNOSIS — R42 Dizziness and giddiness: Secondary | ICD-10-CM | POA: Insufficient documentation

## 2013-04-11 DIAGNOSIS — E785 Hyperlipidemia, unspecified: Secondary | ICD-10-CM | POA: Diagnosis not present

## 2013-04-11 DIAGNOSIS — E119 Type 2 diabetes mellitus without complications: Secondary | ICD-10-CM | POA: Insufficient documentation

## 2013-04-11 DIAGNOSIS — Z87891 Personal history of nicotine dependence: Secondary | ICD-10-CM | POA: Diagnosis not present

## 2013-04-14 ENCOUNTER — Encounter: Payer: Self-pay | Admitting: Cardiology

## 2013-04-14 ENCOUNTER — Ambulatory Visit (INDEPENDENT_AMBULATORY_CARE_PROVIDER_SITE_OTHER): Payer: Medicare Other | Admitting: Cardiology

## 2013-04-14 VITALS — BP 120/66 | HR 51 | Ht 66.0 in | Wt 195.0 lb

## 2013-04-14 DIAGNOSIS — I2510 Atherosclerotic heart disease of native coronary artery without angina pectoris: Secondary | ICD-10-CM | POA: Diagnosis not present

## 2013-04-14 DIAGNOSIS — I679 Cerebrovascular disease, unspecified: Secondary | ICD-10-CM | POA: Diagnosis not present

## 2013-04-14 DIAGNOSIS — E785 Hyperlipidemia, unspecified: Secondary | ICD-10-CM | POA: Diagnosis not present

## 2013-04-14 NOTE — Patient Instructions (Signed)
Your physician wants you to follow-up in: one year with Dr Crenshaw. You will receive a reminder letter in the mail two months in advance. If you don't receive a letter, please call our office to schedule the follow-up appointment.  

## 2013-04-14 NOTE — Progress Notes (Signed)
      HPI: FU CAD; cardiac catheterization in 2004 showed nonobstructive disease. His ejection fraction was 50% based on previous notes from Dr. Daleen Squibb. Chest CT in November of 2013 showed scattered atherosclerotic calcifications in the aorta and coronary arteries. Stress echocardiogram in November of 2013 showed normal LV function and no ischemia. Carotid Dopplers in November of 2014 showed normal carotids. Patient last seen in November of 2013. Since then, the patient denies any dyspnea on exertion, orthopnea, PND, pedal edema, palpitations, syncope or chest pain.    Current Outpatient Prescriptions  Medication Sig Dispense Refill  . amLODipine (NORVASC) 5 MG tablet Take 5 mg by mouth daily.      . Ascorbic Acid (VITAMIN C) 1000 MG tablet Take 1,000 mg by mouth daily.      Marland Kitchen aspirin EC 81 MG tablet Take 81 mg by mouth daily.      . hydrochlorothiazide (MICROZIDE) 12.5 MG capsule Take 12.5 mg by mouth every morning.      . insulin glargine (LANTUS) 100 UNIT/ML injection Inject 16 Units into the skin daily.       Marland Kitchen KRILL OIL 1000 MG CAPS Take 1,000 mg by mouth daily.      Marland Kitchen LIRAGLUTIDE [place] Inject 12 Units into the skin daily.       . metFORMIN (GLUCOPHAGE) 1000 MG tablet Take 1,000 mg by mouth 2 (two) times daily with a meal.      . Multiple Vitamin (MULTIVITAMIN WITH MINERALS) TABS Take 1 tablet by mouth daily.      . pravastatin (PRAVACHOL) 40 MG tablet TAKE 1 TABLET EVERY EVENING  90 tablet  0  . ramipril (ALTACE) 5 MG capsule Take 5 mg by mouth daily.      . STUDY MEDICATION Inject 18 mg as directed daily. Liraglutide or Placebo      . vitamin E 400 UNIT capsule Take 400 Units by mouth daily.       No current facility-administered medications for this visit.     Past Medical History  Diagnosis Date  . Diabetes mellitus without complication   . Hyperlipidemia   . Hiatal hernia   . GERD (gastroesophageal reflux disease)     Past Surgical History  Procedure Laterality Date  .  Hernia repair    . Knee surgery      Knee replacement    History   Social History  . Marital Status: Married    Spouse Name: N/A    Number of Children: 3  . Years of Education: N/A   Occupational History  .     Social History Main Topics  . Smoking status: Former Games developer  . Smokeless tobacco: Not on file  . Alcohol Use: No  . Drug Use: No  . Sexual Activity: Not on file   Other Topics Concern  . Not on file   Social History Narrative  . No narrative on file    ROS: no fevers or chills, productive cough, hemoptysis, dysphasia, odynophagia, melena, hematochezia, dysuria, hematuria, rash, seizure activity, orthopnea, PND, pedal edema, claudication. Remaining systems are negative.  Physical Exam: Well-developed well-nourished in no acute distress.  Skin is warm and dry.  HEENT is normal.  Neck is supple.  Chest is clear to auscultation with normal expansion.  Cardiovascular exam is regular rate and rhythm.  Abdominal exam nontender or distended. No masses palpated. Extremities show no edema. neuro grossly intact  ECG sinus rhythm at a rate of 51. Nonspecific T-wave changes.

## 2013-04-14 NOTE — Assessment & Plan Note (Signed)
Most recent carotid Dopplers normal.

## 2013-04-14 NOTE — Assessment & Plan Note (Signed)
Continue statin. Lipids and liver monitored by primary care. 

## 2013-04-14 NOTE — Assessment & Plan Note (Signed)
Continue aspirin and statin. 

## 2013-04-16 DIAGNOSIS — M654 Radial styloid tenosynovitis [de Quervain]: Secondary | ICD-10-CM | POA: Diagnosis not present

## 2013-04-16 DIAGNOSIS — R079 Chest pain, unspecified: Secondary | ICD-10-CM | POA: Diagnosis not present

## 2013-06-09 DIAGNOSIS — IMO0002 Reserved for concepts with insufficient information to code with codable children: Secondary | ICD-10-CM | POA: Diagnosis not present

## 2013-06-09 DIAGNOSIS — M25519 Pain in unspecified shoulder: Secondary | ICD-10-CM | POA: Diagnosis not present

## 2013-06-16 ENCOUNTER — Other Ambulatory Visit: Payer: Self-pay | Admitting: Cardiology

## 2013-06-30 DIAGNOSIS — E669 Obesity, unspecified: Secondary | ICD-10-CM | POA: Diagnosis not present

## 2013-06-30 DIAGNOSIS — E1129 Type 2 diabetes mellitus with other diabetic kidney complication: Secondary | ICD-10-CM | POA: Diagnosis not present

## 2013-06-30 DIAGNOSIS — N181 Chronic kidney disease, stage 1: Secondary | ICD-10-CM | POA: Diagnosis not present

## 2013-06-30 DIAGNOSIS — E038 Other specified hypothyroidism: Secondary | ICD-10-CM | POA: Diagnosis not present

## 2013-06-30 DIAGNOSIS — E1165 Type 2 diabetes mellitus with hyperglycemia: Secondary | ICD-10-CM | POA: Diagnosis not present

## 2013-07-04 DIAGNOSIS — J019 Acute sinusitis, unspecified: Secondary | ICD-10-CM | POA: Diagnosis not present

## 2013-08-01 DIAGNOSIS — Z125 Encounter for screening for malignant neoplasm of prostate: Secondary | ICD-10-CM | POA: Diagnosis not present

## 2013-08-01 DIAGNOSIS — Z0000 Encounter for general adult medical examination without abnormal findings: Secondary | ICD-10-CM | POA: Diagnosis not present

## 2013-08-01 DIAGNOSIS — Z136 Encounter for screening for cardiovascular disorders: Secondary | ICD-10-CM | POA: Diagnosis not present

## 2013-08-04 ENCOUNTER — Other Ambulatory Visit: Payer: Self-pay | Admitting: Family Medicine

## 2013-08-04 DIAGNOSIS — Z87891 Personal history of nicotine dependence: Secondary | ICD-10-CM

## 2013-08-08 ENCOUNTER — Ambulatory Visit: Payer: Medicare Other

## 2013-08-28 DIAGNOSIS — M25539 Pain in unspecified wrist: Secondary | ICD-10-CM | POA: Diagnosis not present

## 2013-08-29 ENCOUNTER — Ambulatory Visit
Admission: RE | Admit: 2013-08-29 | Discharge: 2013-08-29 | Disposition: A | Discharge status: Home / Self Care | Payer: Medicare Other | Source: Ambulatory Visit | Attending: Family Medicine | Admitting: Family Medicine

## 2013-08-29 ENCOUNTER — Ambulatory Visit: Payer: Medicare Other

## 2013-08-29 DIAGNOSIS — Z87891 Personal history of nicotine dependence: Secondary | ICD-10-CM

## 2013-08-29 DIAGNOSIS — Z136 Encounter for screening for cardiovascular disorders: Secondary | ICD-10-CM | POA: Diagnosis not present

## 2013-10-01 ENCOUNTER — Other Ambulatory Visit (HOSPITAL_COMMUNITY): Payer: Self-pay | Admitting: Orthopaedic Surgery

## 2013-10-01 DIAGNOSIS — S63599A Other specified sprain of unspecified wrist, initial encounter: Secondary | ICD-10-CM

## 2013-10-09 DIAGNOSIS — E669 Obesity, unspecified: Secondary | ICD-10-CM | POA: Diagnosis not present

## 2013-10-09 DIAGNOSIS — E038 Other specified hypothyroidism: Secondary | ICD-10-CM | POA: Diagnosis not present

## 2013-10-09 DIAGNOSIS — Z6830 Body mass index (BMI) 30.0-30.9, adult: Secondary | ICD-10-CM | POA: Diagnosis not present

## 2013-10-09 DIAGNOSIS — N181 Chronic kidney disease, stage 1: Secondary | ICD-10-CM | POA: Diagnosis not present

## 2013-10-09 DIAGNOSIS — E1129 Type 2 diabetes mellitus with other diabetic kidney complication: Secondary | ICD-10-CM | POA: Diagnosis not present

## 2013-10-15 ENCOUNTER — Ambulatory Visit (HOSPITAL_COMMUNITY)
Admission: RE | Admit: 2013-10-15 | Discharge: 2013-10-15 | Disposition: A | Discharge status: Home / Self Care | Payer: Medicare Other | Source: Ambulatory Visit | Attending: Orthopaedic Surgery | Admitting: Orthopaedic Surgery

## 2013-10-15 DIAGNOSIS — S63599A Other specified sprain of unspecified wrist, initial encounter: Secondary | ICD-10-CM

## 2013-10-15 DIAGNOSIS — M25539 Pain in unspecified wrist: Secondary | ICD-10-CM | POA: Insufficient documentation

## 2013-10-15 MED ORDER — GADOBENATE DIMEGLUMINE 529 MG/ML IV SOLN
5.0000 mL | Freq: Once | INTRAVENOUS | Status: AC | PRN
  Administered 2013-10-15: 0.05 mL via INTRAVENOUS

## 2013-10-15 MED ORDER — IOHEXOL 180 MG/ML  SOLN
20.0000 mL | Freq: Once | INTRAMUSCULAR | Status: AC | PRN
  Administered 2013-10-15: 7.5 mL via INTRA_ARTICULAR

## 2013-10-20 DIAGNOSIS — M654 Radial styloid tenosynovitis [de Quervain]: Secondary | ICD-10-CM | POA: Diagnosis not present

## 2013-10-20 DIAGNOSIS — M25539 Pain in unspecified wrist: Secondary | ICD-10-CM | POA: Diagnosis not present

## 2013-11-17 DIAGNOSIS — M5030 Other cervical disc degeneration, unspecified cervical region: Secondary | ICD-10-CM | POA: Diagnosis not present

## 2013-11-21 DIAGNOSIS — M542 Cervicalgia: Secondary | ICD-10-CM | POA: Diagnosis not present

## 2013-11-21 DIAGNOSIS — M5030 Other cervical disc degeneration, unspecified cervical region: Secondary | ICD-10-CM | POA: Diagnosis not present

## 2013-11-24 DIAGNOSIS — M542 Cervicalgia: Secondary | ICD-10-CM | POA: Diagnosis not present

## 2013-11-24 DIAGNOSIS — M5030 Other cervical disc degeneration, unspecified cervical region: Secondary | ICD-10-CM | POA: Diagnosis not present

## 2013-12-02 DIAGNOSIS — M542 Cervicalgia: Secondary | ICD-10-CM | POA: Diagnosis not present

## 2013-12-02 DIAGNOSIS — M5030 Other cervical disc degeneration, unspecified cervical region: Secondary | ICD-10-CM | POA: Diagnosis not present

## 2013-12-08 ENCOUNTER — Other Ambulatory Visit: Payer: Self-pay | Admitting: Cardiology

## 2013-12-31 DIAGNOSIS — M5030 Other cervical disc degeneration, unspecified cervical region: Secondary | ICD-10-CM | POA: Diagnosis not present

## 2013-12-31 DIAGNOSIS — M545 Low back pain, unspecified: Secondary | ICD-10-CM | POA: Diagnosis not present

## 2014-01-09 DIAGNOSIS — E669 Obesity, unspecified: Secondary | ICD-10-CM | POA: Diagnosis not present

## 2014-01-09 DIAGNOSIS — E038 Other specified hypothyroidism: Secondary | ICD-10-CM | POA: Diagnosis not present

## 2014-01-09 DIAGNOSIS — Z23 Encounter for immunization: Secondary | ICD-10-CM | POA: Diagnosis not present

## 2014-01-09 DIAGNOSIS — E1129 Type 2 diabetes mellitus with other diabetic kidney complication: Secondary | ICD-10-CM | POA: Diagnosis not present

## 2014-01-09 DIAGNOSIS — N181 Chronic kidney disease, stage 1: Secondary | ICD-10-CM | POA: Diagnosis not present

## 2014-01-09 DIAGNOSIS — Z6830 Body mass index (BMI) 30.0-30.9, adult: Secondary | ICD-10-CM | POA: Diagnosis not present

## 2014-02-02 DIAGNOSIS — J018 Other acute sinusitis: Secondary | ICD-10-CM | POA: Diagnosis not present

## 2014-02-02 DIAGNOSIS — I10 Essential (primary) hypertension: Secondary | ICD-10-CM | POA: Diagnosis not present

## 2014-02-02 DIAGNOSIS — E785 Hyperlipidemia, unspecified: Secondary | ICD-10-CM | POA: Diagnosis not present

## 2014-02-09 DIAGNOSIS — M47812 Spondylosis without myelopathy or radiculopathy, cervical region: Secondary | ICD-10-CM | POA: Diagnosis not present

## 2014-02-10 DIAGNOSIS — M5030 Other cervical disc degeneration, unspecified cervical region: Secondary | ICD-10-CM | POA: Diagnosis not present

## 2014-02-16 DIAGNOSIS — M542 Cervicalgia: Secondary | ICD-10-CM | POA: Diagnosis not present

## 2014-02-16 DIAGNOSIS — M47812 Spondylosis without myelopathy or radiculopathy, cervical region: Secondary | ICD-10-CM | POA: Diagnosis not present

## 2014-02-26 DIAGNOSIS — E1122 Type 2 diabetes mellitus with diabetic chronic kidney disease: Secondary | ICD-10-CM | POA: Diagnosis not present

## 2014-02-26 DIAGNOSIS — N181 Chronic kidney disease, stage 1: Secondary | ICD-10-CM | POA: Diagnosis not present

## 2014-03-03 DIAGNOSIS — E119 Type 2 diabetes mellitus without complications: Secondary | ICD-10-CM | POA: Diagnosis not present

## 2014-03-10 ENCOUNTER — Encounter (HOSPITAL_COMMUNITY): Payer: Self-pay | Admitting: Emergency Medicine

## 2014-03-10 ENCOUNTER — Emergency Department (HOSPITAL_COMMUNITY)
Admission: EM | Admit: 2014-03-10 | Discharge: 2014-03-10 | Discharge status: Against Medical Advice | Payer: Medicare Other | Attending: Emergency Medicine | Admitting: Emergency Medicine

## 2014-03-10 DIAGNOSIS — Z87891 Personal history of nicotine dependence: Secondary | ICD-10-CM | POA: Insufficient documentation

## 2014-03-10 DIAGNOSIS — E1165 Type 2 diabetes mellitus with hyperglycemia: Secondary | ICD-10-CM | POA: Insufficient documentation

## 2014-03-10 DIAGNOSIS — R110 Nausea: Secondary | ICD-10-CM | POA: Insufficient documentation

## 2014-03-10 DIAGNOSIS — R197 Diarrhea, unspecified: Secondary | ICD-10-CM | POA: Insufficient documentation

## 2014-03-10 DIAGNOSIS — M542 Cervicalgia: Secondary | ICD-10-CM | POA: Diagnosis not present

## 2014-03-10 LAB — CBG MONITORING, ED
Glucose-Capillary: 107 mg/dL — ABNORMAL HIGH (ref 70–99)
Glucose-Capillary: 83 mg/dL (ref 70–99)

## 2014-03-10 NOTE — ED Notes (Signed)
Pt has decided to leave.  States blood sugar is normal.  Pt advised that cbg could change and encouraged evaluation.  Pt states no offense to [place] and care rec'd but going to go ahead and go home.

## 2014-03-10 NOTE — ED Notes (Signed)
Pt from home c/o hyperglycemia since for a few weeks. CBG in 300's today and PCP sent him here. He took took 20 Units this am along with metformin. He reports nausea and diarrhea.

## 2014-03-10 NOTE — ED Notes (Signed)
CBG registered 78

## 2014-03-17 ENCOUNTER — Encounter (HOSPITAL_COMMUNITY): Payer: Self-pay | Admitting: Emergency Medicine

## 2014-03-17 ENCOUNTER — Observation Stay (HOSPITAL_COMMUNITY)
Admission: EM | Admit: 2014-03-17 | Discharge: 2014-03-19 | Disposition: A | Discharge status: Home / Self Care | Payer: Medicare Other | Attending: Internal Medicine | Admitting: Internal Medicine

## 2014-03-17 ENCOUNTER — Encounter: Payer: Self-pay | Admitting: Diagnostic Neuroimaging

## 2014-03-17 ENCOUNTER — Emergency Department (INDEPENDENT_AMBULATORY_CARE_PROVIDER_SITE_OTHER)
Admission: EM | Admit: 2014-03-17 | Discharge: 2014-03-17 | Disposition: A | Discharge status: Nursing Home (Includes ICF) | Payer: Medicare Other | Source: Home / Self Care | Attending: Family Medicine | Admitting: Family Medicine

## 2014-03-17 ENCOUNTER — Emergency Department (HOSPITAL_COMMUNITY): Payer: Medicare Other

## 2014-03-17 ENCOUNTER — Encounter (HOSPITAL_COMMUNITY): Payer: Self-pay | Admitting: *Deleted

## 2014-03-17 ENCOUNTER — Ambulatory Visit (INDEPENDENT_AMBULATORY_CARE_PROVIDER_SITE_OTHER): Payer: Medicare Other | Admitting: Diagnostic Neuroimaging

## 2014-03-17 VITALS — BP 145/68 | HR 45 | Ht 66.0 in | Wt 174.4 lb

## 2014-03-17 DIAGNOSIS — Z794 Long term (current) use of insulin: Secondary | ICD-10-CM | POA: Diagnosis not present

## 2014-03-17 DIAGNOSIS — E785 Hyperlipidemia, unspecified: Secondary | ICD-10-CM | POA: Diagnosis not present

## 2014-03-17 DIAGNOSIS — G44039 Episodic paroxysmal hemicrania, not intractable: Secondary | ICD-10-CM

## 2014-03-17 DIAGNOSIS — K449 Diaphragmatic hernia without obstruction or gangrene: Secondary | ICD-10-CM | POA: Diagnosis not present

## 2014-03-17 DIAGNOSIS — E119 Type 2 diabetes mellitus without complications: Secondary | ICD-10-CM | POA: Diagnosis not present

## 2014-03-17 DIAGNOSIS — M542 Cervicalgia: Secondary | ICD-10-CM

## 2014-03-17 DIAGNOSIS — Z79899 Other long term (current) drug therapy: Secondary | ICD-10-CM | POA: Insufficient documentation

## 2014-03-17 DIAGNOSIS — R51 Headache: Secondary | ICD-10-CM | POA: Diagnosis not present

## 2014-03-17 DIAGNOSIS — R109 Unspecified abdominal pain: Secondary | ICD-10-CM

## 2014-03-17 DIAGNOSIS — K219 Gastro-esophageal reflux disease without esophagitis: Secondary | ICD-10-CM | POA: Insufficient documentation

## 2014-03-17 DIAGNOSIS — R079 Chest pain, unspecified: Secondary | ICD-10-CM | POA: Diagnosis not present

## 2014-03-17 DIAGNOSIS — K30 Functional dyspepsia: Secondary | ICD-10-CM

## 2014-03-17 DIAGNOSIS — I679 Cerebrovascular disease, unspecified: Secondary | ICD-10-CM | POA: Diagnosis not present

## 2014-03-17 DIAGNOSIS — Z7982 Long term (current) use of aspirin: Secondary | ICD-10-CM | POA: Diagnosis not present

## 2014-03-17 DIAGNOSIS — R0789 Other chest pain: Secondary | ICD-10-CM | POA: Diagnosis not present

## 2014-03-17 DIAGNOSIS — G4486 Cervicogenic headache: Secondary | ICD-10-CM

## 2014-03-17 DIAGNOSIS — R1013 Epigastric pain: Secondary | ICD-10-CM | POA: Insufficient documentation

## 2014-03-17 DIAGNOSIS — Z87891 Personal history of nicotine dependence: Secondary | ICD-10-CM | POA: Diagnosis not present

## 2014-03-17 DIAGNOSIS — J984 Other disorders of lung: Secondary | ICD-10-CM | POA: Diagnosis not present

## 2014-03-17 HISTORY — DX: Headache: R51

## 2014-03-17 HISTORY — DX: Personal history of other medical treatment: Z92.89

## 2014-03-17 HISTORY — DX: Unspecified chronic bronchitis: J42

## 2014-03-17 HISTORY — DX: Headache, unspecified: R51.9

## 2014-03-17 HISTORY — DX: Anemia, unspecified: D64.9

## 2014-03-17 HISTORY — DX: Pneumonia, unspecified organism: J18.9

## 2014-03-17 HISTORY — DX: Unspecified osteoarthritis, unspecified site: M19.90

## 2014-03-17 HISTORY — DX: Type 2 diabetes mellitus without complications: E11.9

## 2014-03-17 LAB — BASIC METABOLIC PANEL
Anion gap: 14 (ref 5–15)
BUN: 11 mg/dL (ref 6–23)
CALCIUM: 9.3 mg/dL (ref 8.4–10.5)
CO2: 26 mEq/L (ref 19–32)
CREATININE: 0.83 mg/dL (ref 0.50–1.35)
Chloride: 97 mEq/L (ref 96–112)
GFR calc Af Amer: >90 mL/min (ref >90)
GFR calc non Af Amer: 89 mL/min — ABNORMAL LOW (ref >90)
GLUCOSE: 163 mg/dL — AB (ref 70–99)
Potassium: 3.9 mEq/L (ref 3.7–5.3)
Sodium: 137 mEq/L (ref 137–147)

## 2014-03-17 LAB — I-STAT CHEM 8, ED
BUN: 10 mg/dL (ref 6–23)
CALCIUM ION: 1.19 mmol/L (ref 1.13–1.30)
CREATININE: 0.9 mg/dL (ref 0.50–1.35)
Chloride: 98 mEq/L (ref 96–112)
GLUCOSE: 170 mg/dL — AB (ref 70–99)
HCT: 40 % (ref 39.0–52.0)
HEMOGLOBIN: 13.6 g/dL (ref 13.0–17.0)
POTASSIUM: 3.8 meq/L (ref 3.7–5.3)
Sodium: 139 mEq/L (ref 137–147)
TCO2: 25 mmol/L (ref 0–100)

## 2014-03-17 LAB — CBC
HCT: 37.3 % — ABNORMAL LOW (ref 39.0–52.0)
Hemoglobin: 12.7 g/dL — ABNORMAL LOW (ref 13.0–17.0)
MCH: 30.8 pg (ref 26.0–34.0)
MCHC: 34 g/dL (ref 30.0–36.0)
MCV: 90.3 fL (ref 78.0–100.0)
PLATELETS: 276 10*3/uL (ref 150–400)
RBC: 4.13 MIL/uL — ABNORMAL LOW (ref 4.22–5.81)
RDW: 12.8 % (ref 11.5–15.5)
WBC: 9.1 10*3/uL (ref 4.0–10.5)

## 2014-03-17 LAB — GLUCOSE, CAPILLARY
Glucose-Capillary: 201 mg/dL — ABNORMAL HIGH (ref 70–99)
Glucose-Capillary: 238 mg/dL — ABNORMAL HIGH (ref 70–99)

## 2014-03-17 LAB — I-STAT TROPONIN, ED: Troponin i, poc: 0.01 ng/mL (ref 0.00–0.08)

## 2014-03-17 LAB — TROPONIN I
Troponin I: <0.3 ng/mL (ref <0.30)
Troponin I: <0.3 ng/mL (ref <0.30)

## 2014-03-17 MED ORDER — ONDANSETRON HCL 4 MG/2ML IJ SOLN
4.0000 mg | Freq: Four times a day (QID) | INTRAMUSCULAR | Status: DC | PRN
Start: 2014-03-17 — End: 2014-03-19

## 2014-03-17 MED ORDER — INSULIN ASPART 100 UNIT/ML ~~LOC~~ SOLN
7.0000 [IU] | Freq: Two times a day (BID) | SUBCUTANEOUS | Status: DC
  Administered 2014-03-18 – 2014-03-19 (×2): 7 [IU] via SUBCUTANEOUS

## 2014-03-17 MED ORDER — PANTOPRAZOLE SODIUM 40 MG PO TBEC
40.0000 mg | DELAYED_RELEASE_TABLET | Freq: Every day | ORAL | Status: DC
  Administered 2014-03-17 – 2014-03-19 (×3): 40 mg via ORAL
  Filled 2014-03-17 (×3): qty 1

## 2014-03-17 MED ORDER — NITROGLYCERIN 0.4 MG SL SUBL
SUBLINGUAL_TABLET | SUBLINGUAL | Status: AC
  Filled 2014-03-17: qty 1

## 2014-03-17 MED ORDER — INSULIN GLARGINE 100 UNIT/ML ~~LOC~~ SOLN
11.0000 [IU] | Freq: Every day | SUBCUTANEOUS | Status: DC
Start: 2014-03-17 — End: 2014-03-17

## 2014-03-17 MED ORDER — NITROGLYCERIN 0.4 MG SL SUBL
0.4000 mg | SUBLINGUAL_TABLET | SUBLINGUAL | Status: DC | PRN
  Administered 2014-03-17: 0.4 mg via SUBLINGUAL

## 2014-03-17 MED ORDER — ADULT MULTIVITAMIN W/MINERALS CH
1.0000 | ORAL_TABLET | Freq: Every day | ORAL | Status: DC
  Administered 2014-03-17 – 2014-03-19 (×3): 1 via ORAL
  Filled 2014-03-17 (×3): qty 1

## 2014-03-17 MED ORDER — SULFAMETHOXAZOLE-TRIMETHOPRIM 800-160 MG PO TABS: 1.0000 | ORAL_TABLET | Freq: Two times a day (BID) | ORAL | Status: DC

## 2014-03-17 MED ORDER — MORPHINE SULFATE 4 MG/ML IJ SOLN
4.0000 mg | Freq: Once | INTRAMUSCULAR | Status: AC
  Administered 2014-03-17: 4 mg via INTRAVENOUS
  Filled 2014-03-17: qty 1

## 2014-03-17 MED ORDER — PRAVASTATIN SODIUM 20 MG PO TABS
40.0000 mg | ORAL_TABLET | Freq: Every day | ORAL | Status: DC
  Administered 2014-03-17 – 2014-03-18 (×2): 40 mg via ORAL
  Filled 2014-03-17 (×2): qty 2

## 2014-03-17 MED ORDER — INSULIN LISPRO 100 UNIT/ML (KWIKPEN): 7.0000 [IU] | PEN_INJECTOR | Freq: Two times a day (BID) | SUBCUTANEOUS | Status: DC

## 2014-03-17 MED ORDER — GI COCKTAIL ~~LOC~~
ORAL | Status: AC
  Filled 2014-03-17: qty 30

## 2014-03-17 MED ORDER — LORATADINE 10 MG PO TABS
10.0000 mg | ORAL_TABLET | Freq: Every day | ORAL | Status: DC
  Administered 2014-03-17 – 2014-03-19 (×3): 10 mg via ORAL
  Filled 2014-03-17 (×3): qty 1

## 2014-03-17 MED ORDER — ASPIRIN 81 MG PO CHEW
CHEWABLE_TABLET | ORAL | Status: AC
  Filled 2014-03-17: qty 4

## 2014-03-17 MED ORDER — NITROGLYCERIN 0.4 MG SL SUBL: 0.4000 mg | SUBLINGUAL_TABLET | SUBLINGUAL | Status: DC | PRN

## 2014-03-17 MED ORDER — INSULIN GLARGINE 100 UNIT/ML ~~LOC~~ SOLN
12.0000 [IU] | Freq: Every day | SUBCUTANEOUS | Status: DC
  Administered 2014-03-18 – 2014-03-19 (×2): 12 [IU] via SUBCUTANEOUS
  Filled 2014-03-17 (×2): qty 0.12

## 2014-03-17 MED ORDER — ENOXAPARIN SODIUM 40 MG/0.4ML ~~LOC~~ SOLN
40.0000 mg | SUBCUTANEOUS | Status: DC
  Administered 2014-03-17 – 2014-03-18 (×2): 40 mg via SUBCUTANEOUS
  Filled 2014-03-17 (×2): qty 0.4

## 2014-03-17 MED ORDER — AMLODIPINE BESYLATE 5 MG PO TABS
5.0000 mg | ORAL_TABLET | Freq: Every day | ORAL | Status: DC
  Administered 2014-03-17 – 2014-03-19 (×3): 5 mg via ORAL
  Filled 2014-03-17 (×3): qty 1

## 2014-03-17 MED ORDER — INSULIN GLARGINE 100 UNIT/ML ~~LOC~~ SOLN: 12.0000 [IU] | Freq: Every day | SUBCUTANEOUS | Status: DC

## 2014-03-17 MED ORDER — ASPIRIN EC 325 MG PO TBEC
325.0000 mg | DELAYED_RELEASE_TABLET | Freq: Every day | ORAL | Status: DC
  Administered 2014-03-17 – 2014-03-19 (×3): 325 mg via ORAL
  Filled 2014-03-17 (×3): qty 1

## 2014-03-17 MED ORDER — ACETAMINOPHEN 325 MG PO TABS
650.0000 mg | ORAL_TABLET | Freq: Four times a day (QID) | ORAL | Status: DC | PRN
  Administered 2014-03-18: 650 mg via ORAL
  Filled 2014-03-17: qty 2

## 2014-03-17 MED ORDER — ASPIRIN 81 MG PO CHEW
324.0000 mg | CHEWABLE_TABLET | Freq: Once | ORAL | Status: AC
  Administered 2014-03-17: 324 mg via ORAL

## 2014-03-17 MED ORDER — MORPHINE SULFATE 2 MG/ML IJ SOLN: 2.0000 mg | INTRAMUSCULAR | Status: DC | PRN

## 2014-03-17 MED ORDER — GI COCKTAIL ~~LOC~~
30.0000 mL | Freq: Four times a day (QID) | ORAL | Status: DC | PRN
  Administered 2014-03-18: 30 mL via ORAL
  Filled 2014-03-17: qty 30

## 2014-03-17 MED ORDER — ONDANSETRON HCL 4 MG/2ML IJ SOLN
4.0000 mg | Freq: Once | INTRAMUSCULAR | Status: AC
  Administered 2014-03-17: 4 mg via INTRAVENOUS
  Filled 2014-03-17: qty 2

## 2014-03-17 MED ORDER — INSULIN ASPART 100 UNIT/ML ~~LOC~~ SOLN
0.0000 [IU] | Freq: Every day | SUBCUTANEOUS | Status: DC
  Administered 2014-03-17: 2 [IU] via SUBCUTANEOUS
  Administered 2014-03-18: 4 [IU] via SUBCUTANEOUS

## 2014-03-17 MED ORDER — RAMIPRIL 5 MG PO CAPS
5.0000 mg | ORAL_CAPSULE | Freq: Every day | ORAL | Status: DC
  Administered 2014-03-17 – 2014-03-19 (×3): 5 mg via ORAL
  Filled 2014-03-17 (×3): qty 1

## 2014-03-17 MED ORDER — GI COCKTAIL ~~LOC~~
30.0000 mL | Freq: Once | ORAL | Status: AC
  Administered 2014-03-17: 30 mL via ORAL

## 2014-03-17 MED ORDER — INSULIN ASPART 100 UNIT/ML ~~LOC~~ SOLN
0.0000 [IU] | Freq: Three times a day (TID) | SUBCUTANEOUS | Status: DC
  Administered 2014-03-18 (×2): 3 [IU] via SUBCUTANEOUS
  Administered 2014-03-19: 5 [IU] via SUBCUTANEOUS
  Administered 2014-03-19: 8 [IU] via SUBCUTANEOUS

## 2014-03-17 NOTE — Consult Note (Signed)
Patient ID: Joel Scally Mchaney Sr. MRN: [id number], DOB/AGE: Jul 08, 1945   Admit date: 03/17/2014   Primary Physician: [name], MD Primary Cardiologist: Dr. Stanford Breed  Pt. Profile:  Joel Capistran Loeffler Sr. is a 68 y.o. male with a history of non obst CAD ( [place] in 2004), HLD, hiatal hernia, DM and GERD who presented to urgent care today with epigastric pain and was sent to the [facility] for ischemic work up.  He was recently ill on Saturday with chills and vomiting. He was beginning to feel better but then had epigastric pain prompting him to go to the urgent care. He denies radiation or SOB but says he does need "to catch his breath" due to the pain at times. He has had issues with his hiatal hernia in the past that was reportedly very similar to this. He was given a GI cocktail, SL NTG and ASA in the urgent care with no relief. He asks if someone can see him about his hiatal hernia on this admission. He denies CP, palpitations, orthpnea, PND or LE swelling. No lightheadedness/ syncope. His last stress test was in 2004.    Problem List  Past Medical History  Diagnosis Date  . Diabetes mellitus without complication   . Hyperlipidemia   . Hiatal hernia   . GERD (gastroesophageal reflux disease)     Past Surgical History  Procedure Laterality Date  . Hernia repair    . Knee surgery      Knee replacement  . Hernia repair       Allergies  Allergies  Allergen Reactions  . Codeine Swelling     Home Medications  Prior to Admission medications   Medication Sig Start Date End Date Taking? Authorizing Provider  amLODipine (NORVASC) 5 MG tablet Take 5 mg by mouth daily.    Historical Provider, MD  aspirin 81 MG tablet Take 81 mg by mouth daily.    Historical Provider, MD  BD INSULIN SYRINGE ULTRAFINE 31G X 15/64" 0.3 ML MISC  12/18/13   Historical Provider, MD  cetirizine (ZYRTEC) 10 MG tablet Take 10 mg by mouth daily.    Historical Provider, MD  HUMALOG KWIKPEN 100 UNIT/ML KiwkPen Inject 7  Units into the skin daily. Plus 1 extra unit for every 50 mg/dl above 100mg /dl 12/11/13   Historical Provider, MD  hydrochlorothiazide (HYDRODIURIL) 12.5 MG tablet Take 1 tablet by mouth every morning. 01/14/14   Historical Provider, MD  insulin glargine (LANTUS) 100 UNIT/ML injection Inject 16 Units into the skin daily.     Historical Provider, MD  KRILL OIL 1000 MG CAPS Take 1,000 mg by mouth daily.    Historical Provider, MD  metFORMIN (GLUCOPHAGE) 1000 MG tablet Take 1,000 mg by mouth 2 (two) times daily with a meal.    Historical Provider, MD  Multiple Vitamin (MULTIVITAMIN WITH MINERALS) TABS Take 1 tablet by mouth daily.    Historical Provider, MD  pravastatin (PRAVACHOL) 40 MG tablet TAKE 1 TABLET EVERY EVENING 12/08/13   Lelon Perla, MD  ramipril (ALTACE) 5 MG capsule Take 5 mg by mouth daily.    Historical Provider, MD  STUDY MEDICATION Inject 18 mg as directed daily. Liraglutide or Placebo    Historical Provider, MD  TOUJEO SOLOSTAR 300 UNIT/ML SOPN  02/26/14   Historical Provider, MD  vitamin E 400 UNIT capsule Take 400 Units by mouth daily.    Historical Provider, MD    Family History  Family History  Problem Relation Age of  Onset  . Heart disease      No family history  . Cancer Father     prostate   Family Status  Relation Status Death Age  . Mother Deceased   . Father Deceased   . Sister Alive   . Brother Alive   . Brother Alive   . Brother Alive      Social History  History   Social History  . Marital Status: Married    Spouse Name: Abe People    Number of Children: 3  . Years of Education: College   Occupational History  .     Social History Main Topics  . Smoking status: Former Smoker -- 2.00 packs/day for 30 years    Types: Cigarettes    Quit date: 05/15/1968  . Smokeless tobacco: Never Used  . Alcohol Use: No  . Drug Use: No  . Sexual Activity: Not on file   Other Topics Concern  . Not on file   Social History Narrative   Patient lives at  home with family.   Caffeine Use: occasionally      All other systems reviewed and are otherwise negative except as noted above.  Physical Exam  Height 5\' 6"  (1.676 m), weight 174 lb (78.926 kg), SpO2 95 %.  General: Pleasant, NAD Psych: Normal affect. Neuro: Alert and oriented X 3. Moves all extremities spontaneously. HEENT: Normal  Neck: Supple without bruits or JVD. Lungs:  Resp regular and unlabored, CTA. Heart: RRR no s3, s4, or murmurs. Abdomen: Soft, non-tender, non-distended, BS + x 4.  Extremities: No clubbing, cyanosis or edema. DP/PT/Radials 2+ and equal bilaterally.  Labs  No results for input(s): CKTOTAL, CKMB, TROPONINI in the last 72 hours.    Radiology/Studies  No results found.  ECG  Sinus brady - no acute ST or TW changes  ASSESSMENT AND PLAN  Joel Kauffmann Cranor Sr. is a 68 y.o. male with a history of non obst CAD ( [place] in 2004), HLD, hiatal hernia, DM and GERD who presented to urgent care today with epigastric pain and was sent to the [facility] for ischemic work up.  Chest pain-  -- Troponin pending but ECG non acute -- No heparin unless enzymes turn positive.  -- Plan for lexiscan myoview in the AM  -- [place] need a GI consult as this is similar to his pain with hiatal hernia in the past  HTN- continue home meds  DM- SSI per IM   Signed, Eileen Stanford, PA-C 03/17/2014, 1:04 PM  Pager [phone]  Patient seen in the emergency room.  His history was reviewed.  His EKGs were personally reviewed.  The patient states that the pain that he is experiencing today is identical to that that he has experienced off and on over the past 20-25 years.  He has seen a gastroenterologist many years ago about it.  The pain is in the epigastric area.  He is tender in that area.  He has not been experiencing any precordial discomfort or arm radiation.  At home he usually is able to control his symptoms with antacids. He  has not been taking any H2 blocker or proton pump  inhibitor recently.a CT scan of the chest in 2013 showed scattered coronary artery calcifications.he had a normal stress echo in 2013.he has orthopedic issues with his knees.  We will plan for a Lexiscan Myoview in a.m. to evaluate him further.  I agree with assessment and plan as noted above.

## 2014-03-17 NOTE — ED Notes (Signed)
Pt  Reports       pain  Scale  Of  4  PTA   ntg  Placed  On  Cardiac monitor  / nasal o2  At  2 l  /  Min

## 2014-03-17 NOTE — ED Notes (Signed)
Iv  Ns  tko      20  Angio   l  Hand  1  Att  Site  patent

## 2014-03-17 NOTE — ED Provider Notes (Signed)
CSN: [id number]     Arrival date & time 03/17/14  1120 History   First MD Initiated Contact with Patient 03/17/14 1149     Chief Complaint  Patient presents with  . Chest Pain   (Consider location/radiation/quality/duration/timing/severity/associated sxs/prior Treatment) Patient is a 68 y.o. male presenting with chest pain. The history is provided by the patient and the spouse.  Chest Pain Pain location:  Epigastric and substernal area Pain quality: dull and pressure   Pain radiates to:  Does not radiate Pain radiates to the back: no   Pain severity:  Moderate Progression:  Partially resolved Chronicity:  Recurrent Context comment:  Cp episode on sun lasted sev hr, relapsed today at neurology office, sent to ucc for eval, had pain and diaphoresis level of 15, currently 5. Associated symptoms: diaphoresis   Associated symptoms: no abdominal pain, no fever, no nausea, no palpitations, no shortness of breath and not vomiting     Past Medical History  Diagnosis Date  . Diabetes mellitus without complication   . Hyperlipidemia   . Hiatal hernia   . GERD (gastroesophageal reflux disease)    Past Surgical History  Procedure Laterality Date  . Hernia repair    . Knee surgery      Knee replacement  . Hernia repair     Family History  Problem Relation Age of Onset  . Heart disease      No family history  . Cancer Father     prostate   History  Substance Use Topics  . Smoking status: Former Smoker -- 2.00 packs/day for 30 years    Types: Cigarettes    Quit date: 05/15/1968  . Smokeless tobacco: Never Used  . Alcohol Use: No    Review of Systems  Constitutional: Positive for diaphoresis. Negative for fever.  Respiratory: Negative for shortness of breath.   Cardiovascular: Positive for chest pain. Negative for palpitations.  Gastrointestinal: Negative.  Negative for nausea, vomiting and abdominal pain.    Allergies  Codeine  Home Medications   Prior to Admission  medications   Medication Sig Start Date End Date Taking? Authorizing Provider  amLODipine (NORVASC) 5 MG tablet Take 5 mg by mouth daily.    Historical Provider, MD  aspirin 81 MG tablet Take 81 mg by mouth daily.    Historical Provider, MD  BD INSULIN SYRINGE ULTRAFINE 31G X 15/64" 0.3 ML MISC  12/18/13   Historical Provider, MD  cetirizine (ZYRTEC) 10 MG tablet Take 10 mg by mouth daily.    Historical Provider, MD  HUMALOG KWIKPEN 100 UNIT/ML KiwkPen Inject 7 Units into the skin daily. Plus 1 extra unit for every 50 mg/dl above 100mg /dl 12/11/13   Historical Provider, MD  hydrochlorothiazide (HYDRODIURIL) 12.5 MG tablet Take 1 tablet by mouth every morning. 01/14/14   Historical Provider, MD  insulin glargine (LANTUS) 100 UNIT/ML injection Inject 16 Units into the skin daily.     Historical Provider, MD  KRILL OIL 1000 MG CAPS Take 1,000 mg by mouth daily.    Historical Provider, MD  metFORMIN (GLUCOPHAGE) 1000 MG tablet Take 1,000 mg by mouth 2 (two) times daily with a meal.    Historical Provider, MD  Multiple Vitamin (MULTIVITAMIN WITH MINERALS) TABS Take 1 tablet by mouth daily.    Historical Provider, MD  pravastatin (PRAVACHOL) 40 MG tablet TAKE 1 TABLET EVERY EVENING 12/08/13   Lelon Perla, MD  ramipril (ALTACE) 5 MG capsule Take 5 mg by mouth daily.    Historical  Provider, MD  STUDY MEDICATION Inject 18 mg as directed daily. Liraglutide or Placebo    Historical Provider, MD  TOUJEO SOLOSTAR 300 UNIT/ML SOPN  02/26/14   Historical Provider, MD  vitamin E 400 UNIT capsule Take 400 Units by mouth daily.    Historical Provider, MD   BP 153/71 mmHg  Pulse 51  Temp(Src) 97.7 F (36.5 C) (Oral)  Resp 16  SpO2 98% Physical Exam  Constitutional: He is oriented to person, place, and time. He appears well-developed and well-nourished.  Neck: Normal range of motion. Neck supple.  Cardiovascular: Normal heart sounds and intact distal pulses.   Pulmonary/Chest: Effort normal and breath sounds  normal. He exhibits no tenderness.  Lymphadenopathy:    He has no cervical adenopathy.  Neurological: He is alert and oriented to person, place, and time.  Skin: Skin is warm and dry.  Nursing note and vitals reviewed.   ED Course  Procedures (including critical care time) Labs Review Labs Reviewed - No data to display  Imaging Review No results found.   MDM  No diagnosis found. Sent for cp eval,     Billy Fischer, MD 03/17/14 1210

## 2014-03-17 NOTE — ED Notes (Signed)
[facility] stated Patient was allowed to eat.

## 2014-03-17 NOTE — ED Provider Notes (Signed)
CSN: [id number]     Arrival date & time 03/17/14  1254 History   First MD Initiated Contact with Patient 03/17/14 1310     Chief Complaint  Patient presents with  . Chest Pain     (Consider location/radiation/quality/duration/timing/severity/associated sxs/prior Treatment) The history is provided by the patient and medical records. No language interpreter was used.     Joel Jones Sr. This 68 year old male sent from the urgent care for complaint of chest pain. He had acute onset severe epigastric pain which he describes as sharp, like a knife in his chest, lasting approximately 30 seconds. He rated the pain at that time as "15 out of 10." He states that he could not catch his breath. He states it felt similar to an attack he had with his hiatal hernia previously. Patient was at his neurologist office today when this occurred. He was sent to the urgent care center. Dr. Juventino Slovak consult with cardiology who is aware of the patient at his arrival and patient was sent to the emergency department. He denies any recent URI symptoms, nausea, vomiting. Patient did become diaphoretic at onset of his initial pain. Risk factors for acute coronary syndrome include previous smoking history. This is an 18-pack-year history. Patient has not smoked in the past 30 years. He is also diabetic.no family history of MI. Negative exercise stress test in 2013. He is followed by Dr. Stanford Breed cardiology  Past Medical History  Diagnosis Date  . Diabetes mellitus without complication   . Hyperlipidemia   . Hiatal hernia   . GERD (gastroesophageal reflux disease)    Past Surgical History  Procedure Laterality Date  . Hernia repair    . Knee surgery      Knee replacement  . Hernia repair     Family History  Problem Relation Age of Onset  . Heart disease      No family history  . Cancer Father     prostate   History  Substance Use Topics  . Smoking status: Former Smoker -- 2.00 packs/day for 30 years    Types:  Cigarettes    Quit date: 05/15/1968  . Smokeless tobacco: Never Used  . Alcohol Use: No    Review of Systems  Ten systems reviewed and are negative for acute change, except as noted in the HPI.    Allergies  Codeine  Home Medications   Prior to Admission medications   Medication Sig Start Date End Date Taking? Authorizing Provider  amLODipine (NORVASC) 5 MG tablet Take 5 mg by mouth daily.    Historical Provider, MD  aspirin 81 MG tablet Take 81 mg by mouth daily.    Historical Provider, MD  BD INSULIN SYRINGE ULTRAFINE 31G X 15/64" 0.3 ML MISC  12/18/13   Historical Provider, MD  cetirizine (ZYRTEC) 10 MG tablet Take 10 mg by mouth daily.    Historical Provider, MD  HUMALOG KWIKPEN 100 UNIT/ML KiwkPen Inject 7 Units into the skin daily. Plus 1 extra unit for every 50 mg/dl above 100mg /dl 12/11/13   Historical Provider, MD  hydrochlorothiazide (HYDRODIURIL) 12.5 MG tablet Take 1 tablet by mouth every morning. 01/14/14   Historical Provider, MD  insulin glargine (LANTUS) 100 UNIT/ML injection Inject 16 Units into the skin daily.     Historical Provider, MD  KRILL OIL 1000 MG CAPS Take 1,000 mg by mouth daily.    Historical Provider, MD  metFORMIN (GLUCOPHAGE) 1000 MG tablet Take 1,000 mg by mouth 2 (two) times daily with  a meal.    Historical Provider, MD  Multiple Vitamin (MULTIVITAMIN WITH MINERALS) TABS Take 1 tablet by mouth daily.    Historical Provider, MD  pravastatin (PRAVACHOL) 40 MG tablet TAKE 1 TABLET EVERY EVENING 12/08/13   Lelon Perla, MD  ramipril (ALTACE) 5 MG capsule Take 5 mg by mouth daily.    Historical Provider, MD  STUDY MEDICATION Inject 18 mg as directed daily. Liraglutide or Placebo    Historical Provider, MD  TOUJEO SOLOSTAR 300 UNIT/ML SOPN  02/26/14   Historical Provider, MD  vitamin E 400 UNIT capsule Take 400 Units by mouth daily.    Historical Provider, MD   BP 124/59 mmHg  Pulse 55  Temp(Src) 97.6 F (36.4 C) (Oral)  Resp 17  Ht 5\' 6"  (1.676 m)   Wt 174 lb (78.926 kg)  BMI 28.10 kg/m2  SpO2 98% Physical Exam  Constitutional: He is oriented to person, place, and time. He appears well-developed and well-nourished. No distress.  HENT:  Head: Normocephalic and atraumatic.  Eyes: Conjunctivae are normal. No scleral icterus.  Neck: Normal range of motion. Neck supple.  Cardiovascular: Normal rate, regular rhythm, normal heart sounds and intact distal pulses.   No murmur heard. Pulmonary/Chest: Effort normal and breath sounds normal. No respiratory distress. He has no wheezes.  Abdominal: Soft. Bowel sounds are normal. There is no tenderness.  Musculoskeletal: Normal range of motion. He exhibits no edema.  Neurological: He is alert and oriented to person, place, and time.  Skin: Skin is warm and dry. He is not diaphoretic.  Psychiatric: His behavior is normal.  Nursing note and vitals reviewed.   ED Course  Procedures (including critical care time) Labs Review Labs Reviewed  I-STAT CHEM 8, ED - Abnormal; Notable for the following:    Glucose, Bld 170 (*)    All other components within normal limits  BASIC METABOLIC PANEL  CBC  I-STAT TROPOININ, ED    Imaging Review Dg Chest 2 View  03/17/2014   CLINICAL DATA:  Chest tightness, shortness of breath, and chest pain which the patient associates with his hiatal hernia  EXAM: CHEST  2 VIEW  COMPARISON:  None.  FINDINGS: The lungs are well-expanded. There is abnormal increase density in the left lower lobe. The heart and pulmonary vascularity are normal. There is mild tortuosity of the descending thoracic aorta. The bony thorax is unremarkable.  IMPRESSION: Left lower lobe infiltrate is consistent with pneumonia. Follow-up films following therapy are recommended to assure complete clearing. Chest CT scanning Schlee be ultimately indicated if the patient's symptoms persist.   Electronically Signed   By: David  Martinique   On: 03/17/2014 14:15     EKG Interpretation   Date/Time:  Tuesday  March 17 2014 12:57:33 EST Ventricular Rate:  50 PR Interval:  165 QRS Duration: 106 QT Interval:  436 QTC Calculation: 398 R Axis:   49 Text Interpretation:  Sinus rhythm Normal ECG No significant change since  last tracing Confirmed by [place] ([id number]) on 03/17/2014  2:03:23 PM      MDM   Final diagnoses:  Chest pain, unspecified chest pain type    Patient without hypertension. Here in the emergency department. Negative troponin, lab work shows hyperglycemia, although there was no significant abnormalities. Cardiology has set up patient for Lexiscan in the morning. Chest x-ray shows possible infiltrate in the left lower lobe. I spoken with Dr. Coralyn Pear who will admit the patient for chest pain rule out.EKG does not  show any significant abnormalities or signs of ischemia. Pt stable in ED with no significant deterioration in condition.    Margarita Mail, PA-C 03/17/14 [place], MD 03/19/14 [phone]

## 2014-03-17 NOTE — Progress Notes (Signed)
[facility]  PATIENT: Joel Tallman Raborn Sr. DOB: December 28, 1945  REFERRING CLINICIAN: [place] HISTORY FROM: patient and wife REASON FOR VISIT: new consult    HISTORICAL  CHIEF COMPLAINT:  Chief Complaint  Patient presents with  . Headache    HISTORY OF PRESENT ILLNESS:   68 year old with history of diabetes, hyperlipidemia, hiatal hernia, here for evaluation of headaches. Patient reports generalized dull headaches for past 1 month, without nausea, vomiting, photophobia or phonophobia. Patient reports some neck pain. Sometimes headache radiates from neck pain, left posterior auricular pain, left temporal and left frontal pain. Headaches are almost on a daily basis. These are punctuated by intermittent spasms in attacks and head. Patient has had MRI of cervical spine at orthopedic office but does not no results. No similar headaches in the past. No other triggering factors.  Of note today patient complaining of severe midline upper abdominal pain, which he states is related to a known hiatal hernia which seems to be "acting up". Patient appears very uncomfortable in the exam chair, and he and his wife are awaiting a callback from PCP.   REVIEW OF SYSTEMS: Full 14 system review of systems performed and notable only for ear pain abdominal pain restless leg joint pain neck pain dizziness headache.  ALLERGIES: Allergies  Allergen Reactions  . Codeine Swelling    HOME MEDICATIONS: Outpatient Prescriptions Prior to Visit  Medication Sig Dispense Refill  . amLODipine (NORVASC) 5 MG tablet Take 5 mg by mouth daily.    . insulin glargine (LANTUS) 100 UNIT/ML injection Inject 16 Units into the skin daily.     Marland Kitchen KRILL OIL 1000 MG CAPS Take 1,000 mg by mouth daily.    . metFORMIN (GLUCOPHAGE) 1000 MG tablet Take 1,000 mg by mouth 2 (two) times daily with a meal.    . Multiple Vitamin (MULTIVITAMIN WITH MINERALS) TABS Take 1 tablet by mouth daily.    . pravastatin (PRAVACHOL) 40 MG  tablet TAKE 1 TABLET EVERY EVENING 90 tablet 1  . ramipril (ALTACE) 5 MG capsule Take 5 mg by mouth daily.    . STUDY MEDICATION Inject 18 mg as directed daily. Liraglutide or Placebo    . vitamin E 400 UNIT capsule Take 400 Units by mouth daily.    . Ascorbic Acid (VITAMIN C) 1000 MG tablet Take 1,000 mg by mouth daily.    Marland Kitchen aspirin EC 81 MG tablet Take 81 mg by mouth daily.    . hydrochlorothiazide (MICROZIDE) 12.5 MG capsule Take 12.5 mg by mouth every morning.    Marland Kitchen LIRAGLUTIDE [place] Inject 12 Units into the skin daily.      No facility-administered medications prior to visit.    PAST MEDICAL HISTORY: Past Medical History  Diagnosis Date  . Diabetes mellitus without complication   . Hyperlipidemia   . Hiatal hernia   . GERD (gastroesophageal reflux disease)     PAST SURGICAL HISTORY: Past Surgical History  Procedure Laterality Date  . Hernia repair    . Knee surgery      Knee replacement  . Hernia repair      FAMILY HISTORY: Family History  Problem Relation Age of Onset  . Heart disease      No family history  . Cancer Father     prostate    SOCIAL HISTORY:  History   Social History  . Marital Status: Married    Spouse Name: Abe People    Number of Children: 3  . Years of Education: [employer]  Occupational History  .     Social History Main Topics  . Smoking status: Former Smoker -- 2.00 packs/day for 30 years    Types: Cigarettes    Quit date: 05/15/1968  . Smokeless tobacco: Never Used  . Alcohol Use: No  . Drug Use: No  . Sexual Activity: Not on file   Other Topics Concern  . Not on file   Social History Narrative   Patient lives at home with family.   Caffeine Use: occasionally     PHYSICAL EXAM  Filed Vitals:   03/17/14 0957  BP: 145/68  Pulse: 45  Height: '5\' 6"'  (1.676 m)  Weight: 174 lb 6.4 oz (79.107 kg)    Not recorded      Visual Acuity Screening   Right eye Left eye Both eyes  Without correction:     With correction: 20/50 20/40       Body mass index is 28.16 kg/(m^2).  GENERAL EXAM: Patient is in MODERATE DISTRESS DUE TO MIDLINE ABDOMEN PAIN, BELCHING, BURPING; well developed, nourished and groomed; neck is supple  CARDIOVASCULAR: Regular rate and rhythm, no murmurs, no carotid bruits  NEUROLOGIC: MENTAL STATUS: awake, alert, oriented to person, place and time, recent and remote memory intact, normal attention and concentration, language fluent, comprehension intact, naming intact, fund of knowledge appropriate CRANIAL NERVE: no papilledema on fundoscopic exam, pupils equal and reactive to light, visual fields full to confrontation, extraocular muscles intact, no nystagmus, facial sensation and strength symmetric, hearing intact, palate elevates symmetrically, uvula midline, shoulder shrug symmetric, tongue midline. MOTOR: normal bulk and tone, full strength in the BUE, BLE SENSORY: normal and symmetric to light touch, pinprick, temperature, vibration  COORDINATION: finger-nose-finger, fine finger movements normal REFLEXES: deep tendon reflexes present and TRACE GAIT/STATION: narrow based gait    DIAGNOSTIC DATA (LABS, IMAGING, TESTING) - I reviewed patient records, labs, notes, testing and imaging myself where available.  Lab Results  Component Value Date   WBC 7.7 01/13/2010   HGB 14.6 03/21/2012   HCT 43.0 03/21/2012   MCV 88.8 01/13/2010   PLT 223 01/13/2010      Component Value Date/Time   NA 133* 08/19/2012 0921   K 4.0 08/19/2012 0921   CL 97 08/19/2012 0921   CO2 27 08/19/2012 0921   GLUCOSE 206* 08/19/2012 0921   BUN 20 08/19/2012 0921   CREATININE 1.10 08/19/2012 0921   CREATININE 1.00 03/21/2012 1443   CALCIUM 9.0 08/19/2012 0921   PROT 6.7 05/14/2012 0735   ALBUMIN 4.0 05/14/2012 0735   AST 21 05/14/2012 0735   ALT 22 05/14/2012 0735   ALKPHOS 59 05/14/2012 0735   BILITOT 0.7 05/14/2012 0735   GFRNONAA >60 01/13/2010 0951   GFRAA  01/13/2010 0951    >60        The eGFR has  been calculated using the MDRD equation. This calculation has not been validated in all clinical situations. eGFR's persistently <60 mL/min signify possible Chronic Kidney Disease.   Lab Results  Component Value Date   CHOL 115 05/14/2012   HDL 28.70* 05/14/2012   LDLCALC 68 05/14/2012   TRIG 93.0 05/14/2012   CHOLHDL 4 05/14/2012   Lab Results  Component Value Date   HGBA1C * 07/11/2007    6.9 (NOTE)   The ADA recommends the following therapeutic goals for glycemic   control related to Hgb A1C measurement:   Goal of Therapy:   < 7.0% Hgb A1C   Action Suggested:  > 8.0% Hgb A1C  Ref:  Diabetes Care, 62, Suppl. 1, 1999   No results found for: VITAMINB12 No results found for: TSH    ASSESSMENT AND PLAN  68 y.o. year old male here with headaches x 1 month. Also with severe abdominal pain since this AM.   Ddx: cervicogenic headache, paroxysmal hemicrania, secondary headache  PLAN: - MRI brain; then consider gabapentin - go to urgent care for severe abdomen pain (? Hiatal hernia)  Orders Placed This Encounter  Procedures  . MR Brain Wo Contrast    Return in about 1 month (around 04/16/2014).    Penni Bombard, MD 09/12/8333, 82:51 AM Certified in Neurology, Neurophysiology and Neuroimaging  [facility] [address], [place], [place] [address] [phone]

## 2014-03-17 NOTE — ED Notes (Signed)
Pt   Sent  From  [facility]  Neurology         With         Epigastric         /  Chest  Pain              Sent  To  ucc     To     R/o  Heart         Or  Other problems            -  Symptoms  Started  This  Am           Sent  From  [facility]  Neurology

## 2014-03-17 NOTE — ED Provider Notes (Signed)
CSN: [id number]     Arrival date & time 03/17/14  1120 History   First MD Initiated Contact with Patient 03/17/14 1149     Chief Complaint  Patient presents with  . Chest Pain   (Consider location/radiation/quality/duration/timing/severity/associated sxs/prior Treatment) Patient is a 68 y.o. male presenting with chest pain. The history is provided by the patient.  Chest Pain Pain location:  Epigastric Pain quality: sharp   Pain quality: no pressure and not radiating   Pain radiates to:  Does not radiate Pain radiates to the back: no   Pain severity:  Moderate Onset quality:  Sudden Duration:  4 hours Timing:  Constant Progression:  Improving Chronicity:  Recurrent Relieved by: standing up. Worsened by:  Nothing tried Associated symptoms: abdominal pain (epigastric), diaphoresis and shortness of breath   Associated symptoms: no fever    Patient is a 68 yo male presenting with chest pain. Notes this started this morning at 7 am. It occurred after he got up to use the bathroom. He broke out in to a cold sweat with this. It was in the epigastric region and xyphoid area. No radiation. Improves with standing up. Takes his breath away with the pain. Pain was sharp and stabbing in nature. No nausea or vomiting. No fevers. Has sour taste in the back of his mouth and reflux sensation. Notes the pain has improved and is present at a 5/10 at this time. Had a similar episode 2 weeks ago where he broke in to a sweat at that time. Notes a history of hiatal hernia, diabetes, and bradycardia. No history of MI or family history of MI. He reports stress test 2013 and cardiac cath 2004 that was reportedly normal per patients cardiologist most recent note. He is followed by Dr Stanford Breed.  Past Medical History  Diagnosis Date  . Diabetes mellitus without complication   . Hyperlipidemia   . Hiatal hernia   . GERD (gastroesophageal reflux disease)    Past Surgical History  Procedure Laterality Date  .  Hernia repair    . Knee surgery      Knee replacement  . Hernia repair     Family History  Problem Relation Age of Onset  . Heart disease      No family history  . Cancer Father     prostate   History  Substance Use Topics  . Smoking status: Former Smoker -- 2.00 packs/day for 30 years    Types: Cigarettes    Quit date: 05/15/1968  . Smokeless tobacco: Never Used  . Alcohol Use: No    Review of Systems  Constitutional: Positive for diaphoresis. Negative for fever.  Respiratory: Positive for shortness of breath.   Cardiovascular: Positive for chest pain (lower chest in area of xyphoid). Negative for leg swelling.  Gastrointestinal: Positive for abdominal pain (epigastric).    Allergies  Codeine  Home Medications   Prior to Admission medications   Medication Sig Start Date End Date Taking? Authorizing Provider  amLODipine (NORVASC) 5 MG tablet Take 5 mg by mouth daily.    Historical Provider, MD  aspirin 81 MG tablet Take 81 mg by mouth daily.    Historical Provider, MD  BD INSULIN SYRINGE ULTRAFINE 31G X 15/64" 0.3 ML MISC  12/18/13   Historical Provider, MD  cetirizine (ZYRTEC) 10 MG tablet Take 10 mg by mouth daily.    Historical Provider, MD  HUMALOG KWIKPEN 100 UNIT/ML KiwkPen Inject 7 Units into the skin daily. Plus 1 extra unit for  every 50 mg/dl above 100mg /dl 12/11/13   Historical Provider, MD  hydrochlorothiazide (HYDRODIURIL) 12.5 MG tablet Take 1 tablet by mouth every morning. 01/14/14   Historical Provider, MD  insulin glargine (LANTUS) 100 UNIT/ML injection Inject 16 Units into the skin daily.     Historical Provider, MD  KRILL OIL 1000 MG CAPS Take 1,000 mg by mouth daily.    Historical Provider, MD  metFORMIN (GLUCOPHAGE) 1000 MG tablet Take 1,000 mg by mouth 2 (two) times daily with a meal.    Historical Provider, MD  Multiple Vitamin (MULTIVITAMIN WITH MINERALS) TABS Take 1 tablet by mouth daily.    Historical Provider, MD  pravastatin (PRAVACHOL) 40 MG tablet  TAKE 1 TABLET EVERY EVENING 12/08/13   Lelon Perla, MD  ramipril (ALTACE) 5 MG capsule Take 5 mg by mouth daily.    Historical Provider, MD  STUDY MEDICATION Inject 18 mg as directed daily. Liraglutide or Placebo    Historical Provider, MD  TOUJEO SOLOSTAR 300 UNIT/ML SOPN  02/26/14   Historical Provider, MD  vitamin E 400 UNIT capsule Take 400 Units by mouth daily.    Historical Provider, MD   BP 153/71 mmHg  Pulse 51  Temp(Src) 97.7 F (36.5 C) (Oral)  Resp 16  SpO2 98% Physical Exam  Constitutional: He appears well-developed and well-nourished.  Uncomfortable appearing  HENT:  Head: Normocephalic and atraumatic.  Eyes: Conjunctivae are normal. Pupils are equal, round, and reactive to light.  Cardiovascular: Regular rhythm and normal heart sounds.   Bradycardia  Pulmonary/Chest: Effort normal and breath sounds normal.  Abdominal: Soft. He exhibits no distension. There is tenderness (epigastric). There is no rebound and no guarding.  Musculoskeletal: He exhibits no edema.  Neurological: He is alert.  Skin: Skin is warm and dry. He is not diaphoretic.    ED Course  Procedures (including critical care time) Labs Review Labs Reviewed - No data to display  Imaging Review No results found.  EKG: sinus bradycardia, no ST or T wave abnormalities noted  Patient given SL nitro, Aspirin, and GI cocktail. States no improvement in symptoms with this.   MDM   1. Chest pain, unspecified chest pain type    Patient is a 68 yo male presenting with atypical chest pain, though with recurrence and diaphoresis this is worrisome for cardiac cause of pain. Could potentially be related to patients hiatal hernia. Patient with risk factor of DM and HLD. No history of MI. Stress echo in 2013 normal. EKG with no ischemic changes noted today at 11:35 am. Patient was placed on telemetry and O2. He was given SL nitro, Aspirin 324 mg, and a GI cocktail for his discomfort. EMS was contacted for  transport to [facility] ED for further evaluation of potential cardiac cause.   Patient was discussed with and seen by Dr Juventino Slovak. He helped formulate the plan of care for this patient.   Tommi Rumps, MD [place] PGY3    Leone Haven, MD 03/17/14 [place], MD 03/17/14 [phone]

## 2014-03-17 NOTE — Patient Instructions (Signed)
I will check MRI brain. Then we [place] consider gabapentin for headache control.  Please go to urgent care for stomach pain / hiatal hernia pain, and to rule out heart or other problems.

## 2014-03-17 NOTE — ED Notes (Signed)
Patient returned from X-ray 

## 2014-03-17 NOTE — H&P (Signed)
[facility] History and Physical  Joel Panuco Mordecai Sr. [id number]:[id number] DOB: 19-May-1945 DOA: 03/17/2014  Referring physician:  PCP: [name], MD   Chief Complaint: Chest pain  HPI: Joel Bollard Baston Sr. is a 67 y.o. male with a history of type 2 diabetes mellitus, dyslipidemia, nonobstructive coronary artery disease based on chronic catheterization in 2004 presenting to the emergency room with complaints of chest discomfort. He reports having episode of chest pain last Saturday located in the lower substernal region/epigastric region, characterized as sharp, stabbing, nonradiating, associated with nausea vomiting and diaphoresis. It was lasted for approximately 20 minutes. He reports an second episode of nausea and vomiting with diaphoresis yesterday. Workup in the emergency room showing a troponin of 0.01, EKG did not show ischemic changes. Patient was seen and evaluated by cardiology. He was chest pain-free during my evaluation   Review of Systems:  Constitutional:  No weight loss, night sweats, Fevers, chills, fatigue.  HEENT:  No headaches, Difficulty swallowing,Tooth/dental problems,Sore throat,  No sneezing, itching, ear ache, nasal congestion, post nasal drip,  Cardio-vascular:  Positive forchest pain, Orthopnea, PND, swelling in lower extremities, anasarca, dizziness, palpitations  GI:  No heartburn, indigestion, positive for epigastric pain, nausea, vomiting, denies diarrhea, change in bowel habits, loss of appetite  Resp:  No shortness of breath with exertion or at rest. No excess mucus, no productive cough, No non-productive cough, No coughing up of blood.No change in color of mucus.No wheezing.No chest wall deformity  Skin:  no rash or lesions.  GU:  no dysuria, change in color of urine, no urgency or frequency. No flank pain.  Musculoskeletal:  No joint pain or swelling. No decreased range of motion. No back pain.  Psych:  No change in mood or affect. No depression or  anxiety. No memory loss.   Past Medical History  Diagnosis Date  . Diabetes mellitus without complication   . Hyperlipidemia   . Hiatal hernia   . GERD (gastroesophageal reflux disease)    Past Surgical History  Procedure Laterality Date  . Hernia repair    . Knee surgery      Knee replacement  . Hernia repair     Social History:  reports that he quit smoking about 45 years ago. His smoking use included Cigarettes. He has a 60 pack-year smoking history. He has never used smokeless tobacco. He reports that he does not drink alcohol or use illicit drugs.  Allergies  Allergen Reactions  . Codeine Swelling  . Roxicodone [Oxycodone] Itching    Family History  Problem Relation Age of Onset  . Heart disease      No family history  . Cancer Father     prostate     Prior to Admission medications   Medication Sig Start Date End Date Taking? Authorizing Provider  amLODipine (NORVASC) 5 MG tablet Take 5 mg by mouth daily.   Yes Historical Provider, MD  aspirin 81 MG tablet Take 81 mg by mouth daily.   Yes Historical Provider, MD  BD INSULIN SYRINGE ULTRAFINE 31G X 15/64" 0.3 ML MISC  12/18/13  Yes Historical Provider, MD  cetirizine (ZYRTEC) 10 MG tablet Take 10 mg by mouth daily.   Yes Historical Provider, MD  HUMALOG KWIKPEN 100 UNIT/ML KiwkPen Inject 7 Units into the skin 2 (two) times daily with a meal. Plus 1 extra unit for every 50 mg/dl above 100mg /dl 12/11/13  Yes Historical Provider, MD  hydrochlorothiazide (HYDRODIURIL) 12.5 MG tablet Take 1 tablet by mouth every morning.  01/14/14  Yes Historical Provider, MD  KRILL OIL 1000 MG CAPS Take 1,000 mg by mouth daily.   Yes Historical Provider, MD  metFORMIN (GLUCOPHAGE) 1000 MG tablet Take 1,000 mg by mouth 2 (two) times daily with a meal.   Yes Historical Provider, MD  Multiple Vitamin (MULTIVITAMIN WITH MINERALS) TABS Take 1 tablet by mouth daily.   Yes Historical Provider, MD  pravastatin (PRAVACHOL) 40 MG tablet Take 40 mg by mouth  daily.   Yes Historical Provider, MD  ramipril (ALTACE) 5 MG capsule Take 5 mg by mouth daily.   Yes Historical Provider, MD  TOUJEO SOLOSTAR 300 UNIT/ML SOPN Inject 14 Units into the skin daily.  02/26/14  Yes Historical Provider, MD  vitamin E 400 UNIT capsule Take 400 Units by mouth daily.   Yes Historical Provider, MD  insulin glargine (LANTUS) 100 UNIT/ML injection Inject 11-16 Units into the skin daily.     Historical Provider, MD  pravastatin (PRAVACHOL) 40 MG tablet TAKE 1 TABLET EVERY EVENING Patient not taking: Reported on 03/17/2014 12/08/13   Lelon Perla, MD  STUDY MEDICATION Inject 18 mg as directed daily. Liraglutide or Placebo    Historical Provider, MD   Physical Exam: Filed Vitals:   03/17/14 1315 03/17/14 1330 03/17/14 1425 03/17/14 1430  BP: 123/60 125/59  124/59  Pulse: 46 45 55 55  Temp:      TempSrc:      Resp: 12 13 11 17   Height:      Weight:      SpO2: 99% 98% 99% 98%    Wt Readings from Last 3 Encounters:  03/17/14 78.926 kg (174 lb)  03/17/14 79.107 kg (174 lb 6.4 oz)  04/14/13 88.451 kg (195 lb)    General:  Appears calm and comfortable, no acute distress. He is chest pain-free during my encounter Eyes: PERRL, normal lids, irises & conjunctiva ENT: grossly normal hearing, lips & tongue Neck: no LAD, masses or thyromegaly Cardiovascular: RRR, no m/r/g. No LE edema. Telemetry: SR, no arrhythmias  Respiratory: CTA bilaterally, no w/r/r. Normal respiratory effort. Abdomen: soft, ntnd Skin: no rash or induration seen on limited exam Musculoskeletal: grossly normal tone BUE/BLE Psychiatric: grossly normal mood and affect, speech fluent and appropriate Neurologic: grossly non-focal.          Labs on Admission:  Basic Metabolic Panel:  Recent Labs Lab 03/17/14 1435 03/17/14 1443  NA 137 139  K 3.9 3.8  CL 97 98  CO2 26  --   GLUCOSE 163* 170*  BUN 11 10  CREATININE 0.83 0.90  CALCIUM 9.3  --    Liver Function Tests: No results for  input(s): AST, ALT, ALKPHOS, BILITOT, PROT, ALBUMIN in the last 168 hours. No results for input(s): LIPASE, AMYLASE in the last 168 hours. No results for input(s): AMMONIA in the last 168 hours. CBC:  Recent Labs Lab 03/17/14 1435 03/17/14 1443  WBC 9.1  --   HGB 12.7* 13.6  HCT 37.3* 40.0  MCV 90.3  --   PLT 276  --    Cardiac Enzymes: No results for input(s): CKTOTAL, CKMB, CKMBINDEX, TROPONINI in the last 168 hours.  BNP (last 3 results) No results for input(s): PROBNP in the last 8760 hours. CBG:  Recent Labs Lab 03/10/14 1727  GLUCAP 107*    Radiological Exams on Admission: Dg Chest 2 View  03/17/2014   CLINICAL DATA:  Chest tightness, shortness of breath, and chest pain which the patient associates with his hiatal hernia  EXAM: CHEST  2 VIEW  COMPARISON:  None.  FINDINGS: The lungs are well-expanded. There is abnormal increase density in the left lower lobe. The heart and pulmonary vascularity are normal. There is mild tortuosity of the descending thoracic aorta. The bony thorax is unremarkable.  IMPRESSION: Left lower lobe infiltrate is consistent with pneumonia. Follow-up films following therapy are recommended to assure complete clearing. Chest CT scanning [place] be ultimately indicated if the patient's symptoms persist.   Electronically Signed   By: David  Martinique   On: 03/17/2014 14:15    EKG: Independently reviewed.EKG showing sinus rhythm without ischemic changes   Assessment/Plan Principal Problem:   Chest pain Active Problems:   Diabetes mellitus without complication   Hyperlipidemia   Cerebrovascular disease   1. Chest pain. Patient presented with chest pain having atypical features, he has several cardiovascular risk factors including dyslipidemia and type 2 diabetes mellitus. EKG did not show ischemic changes, troponin negative. In the emergency room he was seen and evaluated by cardiology who recommended stress test in a.m. Will place him in 24 hour  observation, on continuous cardiac monitoring, cycle troponin, make him nothing by mouth after midnight, check a fasting lipid panel, continue antiplatelet therapy with aspirin. Symptoms could be secondary to gastroesophageal reflux disease, will start him on PPI therapy with Protonix.  2. Hypertension. Continue amlodipine 5 mg by mouth daily and altace 5 mg by mouth daily 3. Dyslipidemia. Check a fasting lipid panel, continue pravastatin 40 mg by mouth daily 4. Question pneumonia. Radiology reporting a left lower lobe infiltrate on chest x-ray that was done in the emergency room. Patient is afebrile, nontoxic appearing, denies cough, shortness of breath, sputum production or any other symptoms suggestive of pneumonia. He does present with chest pain however seems more consistent with GI origin rather than being pleuritic. Labs showed a normal white count of 9100. Plan to repeat chest x-ray in a.m. Meanwhile will observe him overnight off of antibiotic therapy, reassess need for antimicrobial therapy in a.m.   5. Insulin dependent diabetes mellitus. Check a hemoglobin A1c, perform Accu-Cheks before every meal C daily at bedtime, continue Lantus. 6. DVT prophylaxis Lovenox   Code Status: Full code Family Communication: Spoke to his wife at bedside Disposition Plan: Will place patient in 24-hour observation, do not anticipate him requiring greater than 2 nights hospitalization  Time spent: 55 min  Kelvin Cellar [facility] Pager [phone]

## 2014-03-17 NOTE — ED Notes (Signed)
EMS - Patient coming from Urgent Care with mid chest pain with no radiation.  HX of hiatal hernia with same location as the chest pain.  Dull and none radiation, no N/V/D, no SOB.  Aspirin 324mg , 1 Nitro and GI cocktail with no relief.  EKG unremarkable with Sinus Loletha Grayer.  120/66, 57 HR, 95% RA, 16 and CBG 194.

## 2014-03-18 ENCOUNTER — Observation Stay (HOSPITAL_COMMUNITY): Payer: Medicare Other

## 2014-03-18 DIAGNOSIS — R079 Chest pain, unspecified: Secondary | ICD-10-CM | POA: Diagnosis not present

## 2014-03-18 DIAGNOSIS — R1013 Epigastric pain: Secondary | ICD-10-CM | POA: Diagnosis not present

## 2014-03-18 DIAGNOSIS — E785 Hyperlipidemia, unspecified: Secondary | ICD-10-CM | POA: Diagnosis not present

## 2014-03-18 DIAGNOSIS — E119 Type 2 diabetes mellitus without complications: Secondary | ICD-10-CM | POA: Diagnosis not present

## 2014-03-18 DIAGNOSIS — R072 Precordial pain: Secondary | ICD-10-CM

## 2014-03-18 DIAGNOSIS — R918 Other nonspecific abnormal finding of lung field: Secondary | ICD-10-CM | POA: Diagnosis not present

## 2014-03-18 LAB — CBC
HEMATOCRIT: 34.2 % — AB (ref 39.0–52.0)
Hemoglobin: 11.7 g/dL — ABNORMAL LOW (ref 13.0–17.0)
MCH: 30.2 pg (ref 26.0–34.0)
MCHC: 34.2 g/dL (ref 30.0–36.0)
MCV: 88.4 fL (ref 78.0–100.0)
Platelets: 259 10*3/uL (ref 150–400)
RBC: 3.87 MIL/uL — ABNORMAL LOW (ref 4.22–5.81)
RDW: 13 % (ref 11.5–15.5)
WBC: 4.9 10*3/uL (ref 4.0–10.5)

## 2014-03-18 LAB — BASIC METABOLIC PANEL
Anion gap: 11 (ref 5–15)
BUN: 14 mg/dL (ref 6–23)
CALCIUM: 8.8 mg/dL (ref 8.4–10.5)
CO2: 26 meq/L (ref 19–32)
CREATININE: 0.99 mg/dL (ref 0.50–1.35)
Chloride: 100 mEq/L (ref 96–112)
GFR calc Af Amer: >90 mL/min (ref >90)
GFR calc non Af Amer: 83 mL/min — ABNORMAL LOW (ref >90)
Glucose, Bld: 259 mg/dL — ABNORMAL HIGH (ref 70–99)
Potassium: 4 mEq/L (ref 3.7–5.3)
SODIUM: 137 meq/L (ref 137–147)

## 2014-03-18 LAB — HEMOGLOBIN A1C
HEMOGLOBIN A1C: 8.3 % — AB (ref <5.7)
MEAN PLASMA GLUCOSE: 192 mg/dL — AB (ref <117)

## 2014-03-18 LAB — GLUCOSE, CAPILLARY
GLUCOSE-CAPILLARY: 190 mg/dL — AB (ref 70–99)
GLUCOSE-CAPILLARY: 165 mg/dL — AB (ref 70–99)
GLUCOSE-CAPILLARY: 192 mg/dL — AB (ref 70–99)
GLUCOSE-CAPILLARY: 329 mg/dL — AB (ref 70–99)
GLUCOSE-CAPILLARY: 274 mg/dL — AB (ref 70–99)
Glucose-Capillary: 109 mg/dL — ABNORMAL HIGH (ref 70–99)

## 2014-03-18 LAB — TROPONIN I: Troponin I: <0.3 ng/mL (ref <0.30)

## 2014-03-18 NOTE — Progress Notes (Signed)
Unable to complete treadmill tolerance test due to knee problem, discussed with patient and Dr. Johnsie Cancel, [facility] stress test ordered for tomorrow AM. NPO after midnight, resume diet today.  Hilbert Corrigan PA Pager: [phone]

## 2014-03-18 NOTE — Progress Notes (Signed)
PROGRESS NOTE    Joel Kerner Pizzolato Sr. LPF:[id number] DOB: Feb 11, 1946 DOA: 03/17/2014 PCP: [name], MD  HPI/Brief narrative 68 year old male with history of DM 2, dyslipidemia, nonobstructive CAD based on cardiac catheterization 2004, presented to the ED with complaints of epigastric/lower substernal chest discomfort which has been intermittent over the last few weeks but got worse on day of admission, minimal to no relief with Tums but was associated with some water brash, eventually resolved in the ED after IV morphine.    Assessment/Plan:  1. Atypical chest pain: troponin 3 negative. EKG without acute changes. Cardiology was consulted and plan stress test. Telemetry shows sinus bradycardia in the 40s-50s without arrhythmias. Chest pain is suspicious for GERD. Continue PPI. Currently without chest pain. 2. Essential hypertension:Controlled. Continue amlodipine and Altase. 3. Dyslipidemia: Continue statins. 4. DM 2/IDDM: Fluctuating CBGs. Continue Lantus and NovoLog mealtime and SSI. 5. Abnormal chest x-ray: Read as left lower lobe infiltrate. However patient denies any respiratory symptoms i.e. Cough, dyspnea, fever or chills. Repeat chest x-ray negative. No antibiotics started.   Code Status: full Family Communication: discussed with spouse, son and granddaughter at bedside. Disposition Plan: home possibly 11/5 after stress test results.   Consultants:  cardiology  Procedures:  none  Antibiotics:  none   Subjective: Denies any further chest discomfort or dyspnea.  Objective: Filed Vitals:   03/18/14 0020 03/18/14 0430 03/18/14 0811 03/18/14 1435  BP: 106/48 118/59 105/57 107/56  Pulse: 60 50 42 43  Temp: 98.4 F (36.9 C) 98.4 F (36.9 C) 98.1 F (36.7 C) 98.2 F (36.8 C)  TempSrc: Oral Oral Oral Oral  Resp:   18 18  Height:      Weight:  85.14 kg (187 lb 11.2 oz)    SpO2: 94% 94% 96% 96%   No intake or output data in the 24 hours ending 03/18/14  1828 Filed Weights   03/17/14 1303 03/17/14 1814 03/18/14 0430  Weight: 78.926 kg (174 lb) 78.926 kg (174 lb) 85.14 kg (187 lb 11.2 oz)     Exam:  General exam: pleasant middle-aged male lying comfortably in bed. Respiratory system: Clear. No increased work of breathing. Cardiovascular system: S1 & S2 heard, RRR. No JVD, murmurs, gallops, clicks or pedal edema.telemetry: Sinus bradycardia in the 40s-50s. Gastrointestinal system: Abdomen is nondistended, soft and nontender. Normal bowel sounds heard. Central nervous system: Alert and oriented. No focal neurological deficits. Extremities: Symmetric 5 x 5 power.   Data Reviewed: Basic Metabolic Panel:  Recent Labs Lab 03/17/14 1435 03/17/14 1443 03/18/14 0054  NA 137 139 137  K 3.9 3.8 4.0  CL 97 98 100  CO2 26  --  26  GLUCOSE 163* 170* 259*  BUN 11 10 14   CREATININE 0.83 0.90 0.99  CALCIUM 9.3  --  8.8   Liver Function Tests: No results for input(s): AST, ALT, ALKPHOS, BILITOT, PROT, ALBUMIN in the last 168 hours. No results for input(s): LIPASE, AMYLASE in the last 168 hours. No results for input(s): AMMONIA in the last 168 hours. CBC:  Recent Labs Lab 03/17/14 1435 03/17/14 1443 03/18/14 0054  WBC 9.1  --  4.9  HGB 12.7* 13.6 11.7*  HCT 37.3* 40.0 34.2*  MCV 90.3  --  88.4  PLT 276  --  259   Cardiac Enzymes:  Recent Labs Lab 03/17/14 1837 03/17/14 2044 03/18/14 0054  TROPONINI <0.30 <0.30 <0.30   BNP (last 3 results) No results for input(s): PROBNP in the last 8760 hours. CBG:  Recent Labs Lab 03/17/14 2009 03/18/14 0428 03/18/14 0752 03/18/14 1226 03/18/14 1646  GLUCAP 238* 190* 165* 109* 192*    No results found for this or any previous visit (from the past 240 hour(s)).      Studies: Dg Chest 2 View  03/18/2014   CLINICAL DATA:  Mid chest and epigastric discomfort; history of hiatal hernia and reflux and chronic bronchitis  EXAM: CHEST  2 VIEW  COMPARISON:  PA and lateral chest of  March 17, 2014  FINDINGS: The lungs are mildly hypoinflated. There is no focal infiltrate. The heart and pulmonary vascularity are within the limits of normal. There are calcifications in the right hilar region which are stable. There is tortuosity of the descending thoracic aorta. There is no pleural effusion or pneumothorax. There are degenerative changes of the mid and lower lthoracic disc spaces.  IMPRESSION: There is mild pulmonary hypoinflation. There is no acute cardiopulmonary abnormality.   Electronically Signed   By: David  Martinique   On: 03/18/2014 11:56   Dg Chest 2 View  03/17/2014   CLINICAL DATA:  Chest tightness, shortness of breath, and chest pain which the patient associates with his hiatal hernia  EXAM: CHEST  2 VIEW  COMPARISON:  None.  FINDINGS: The lungs are well-expanded. There is abnormal increase density in the left lower lobe. The heart and pulmonary vascularity are normal. There is mild tortuosity of the descending thoracic aorta. The bony thorax is unremarkable.  IMPRESSION: Left lower lobe infiltrate is consistent with pneumonia. Follow-up films following therapy are recommended to assure complete clearing. Chest CT scanning [place] be ultimately indicated if the patient's symptoms persist.   Electronically Signed   By: David  Martinique   On: 03/17/2014 14:15        Scheduled Meds: . amLODipine  5 mg Oral Daily  . aspirin EC  325 mg Oral Daily  . enoxaparin (LOVENOX) injection  40 mg Subcutaneous Q24H  . insulin aspart  0-15 Units Subcutaneous TID WC  . insulin aspart  0-5 Units Subcutaneous QHS  . insulin aspart  7 Units Subcutaneous BID WC  . insulin glargine  12 Units Subcutaneous Daily  . loratadine  10 mg Oral Daily  . multivitamin with minerals  1 tablet Oral Daily  . pantoprazole  40 mg Oral Daily  . pravastatin  40 mg Oral q1800  . ramipril  5 mg Oral Daily   Continuous Infusions:   Principal Problem:   Chest pain Active Problems:   Diabetes mellitus without  complication   Hyperlipidemia   Cerebrovascular disease    Time spent: 20 minutes.    Vernell Leep, MD, FACP, FHM. [facility] Pager [phone]  If 7PM-7AM, please contact night-coverage [email or web address] Password [email or web address] 03/18/2014, 6:28 PM    LOS: 1 day

## 2014-03-18 NOTE — Plan of Care (Signed)
Problem: Phase I Progression Outcomes Goal: Aspirin unless contraindicated Outcome: Completed/Met Date Met:  03/18/14

## 2014-03-18 NOTE — Progress Notes (Signed)
UR completed 

## 2014-03-18 NOTE — Plan of Care (Signed)
Problem: Consults Goal: Diabetes Guidelines if Diabetic/Glucose > 140 If diabetic or lab glucose is > 140 mg/dl - Initiate Diabetes/Hyperglycemia Guidelines & Document Interventions  Outcome: Completed/Met Date Met:  03/18/14  Problem: Phase I Progression Outcomes Goal: Hemodynamically stable Outcome: Completed/Met Date Met:  03/18/14 Goal: Anginal pain relieved Outcome: Completed/Met Date Met:  03/18/14 Goal: MD aware of Cardiac Marker results Outcome: Completed/Met Date Met:  03/18/14 Goal: Voiding-avoid urinary catheter unless indicated Outcome: Completed/Met Date Met:  03/18/14     

## 2014-03-18 NOTE — Progress Notes (Signed)
Nutrition Brief Note  Patient identified on the Malnutrition Screening Tool (MST) Report for 35 lb weight loss in the past year. Per review of usual weights below, patient has lost 4% of his weight over the past year, which is an insignificant amount for the time frame. Good intake PTA.  Wt Readings from Last 15 Encounters:  03/18/14 187 lb 11.2 oz (85.14 kg)  03/17/14 174 lb 6.4 oz (79.107 kg)  04/14/13 195 lb (88.451 kg)  08/27/12 194 lb 12.8 oz (88.361 kg)  08/15/12 192 lb (87.091 kg)  04/02/12 200 lb (90.719 kg)    Body mass index is 30.31 kg/(m^2). Patient meets criteria for obesity, class 1, based on current BMI.   Current diet order is NPO for procedure. Labs and medications reviewed.   No nutrition interventions warranted at this time. If nutrition issues arise, please consult RD.   Molli Barrows, RD, LDN, [place] Pager [phone] After Hours Pager [phone]

## 2014-03-18 NOTE — Progress Notes (Signed)
Patient ID: Joel Printup Zweber Sr., male   DOB: 1945/06/14, 68 y.o.   MRN: [id number]    Subjective:  GERD this am relieved with GI cocktail   Objective:  Filed Vitals:   03/17/14 2011 03/18/14 0020 03/18/14 0430 03/18/14 0811  BP: 121/63 106/48 118/59 105/57  Pulse: 54 60 50 42  Temp: 98.5 F (36.9 C) 98.4 F (36.9 C) 98.4 F (36.9 C) 98.1 F (36.7 C)  TempSrc: Oral Oral Oral Oral  Resp:    18  Height:      Weight:   85.14 kg (187 lb 11.2 oz)   SpO2: 94% 94% 94% 96%    Intake/Output from previous day:  Intake/Output Summary (Last 24 hours) at 03/18/14 5625 Last data filed at 03/17/14 1441  Gross per 24 hour  Intake      0 ml  Output    200 ml  Net   -200 ml    Physical Exam: Affect appropriate Healthy:  appears stated age HEENT: normal Neck supple with no adenopathy JVP normal no bruits no thyromegaly Lungs clear with no wheezing and good diaphragmatic motion Heart:  S1/S2 no murmur, no rub, gallop or click PMI normal Abdomen: benighn, BS positve, no tenderness, no AAA no bruit.  No HSM or HJR Distal pulses intact with no bruits No edema Neuro non-focal Skin warm and dry No muscular weakness   Lab Results: Basic Metabolic Panel:  Recent Labs  03/17/14 1435 03/17/14 1443 03/18/14 0054  NA 137 139 137  K 3.9 3.8 4.0  CL 97 98 100  CO2 26  --  26  GLUCOSE 163* 170* 259*  BUN 11 10 14   CREATININE 0.83 0.90 0.99  CALCIUM 9.3  --  8.8   CBC:  Recent Labs  03/17/14 1435 03/17/14 1443 03/18/14 0054  WBC 9.1  --  4.9  HGB 12.7* 13.6 11.7*  HCT 37.3* 40.0 34.2*  MCV 90.3  --  88.4  PLT 276  --  259   Cardiac Enzymes:  Recent Labs  03/17/14 1837 03/17/14 2044 03/18/14 0054  TROPONINI <0.30 <0.30 <0.30     Recent Labs  03/17/14 1837  HGBA1C 8.3*    Imaging: Dg Chest 2 View  03/17/2014   CLINICAL DATA:  Chest tightness, shortness of breath, and chest pain which the patient associates with his hiatal hernia  EXAM: CHEST  2 VIEW   COMPARISON:  None.  FINDINGS: The lungs are well-expanded. There is abnormal increase density in the left lower lobe. The heart and pulmonary vascularity are normal. There is mild tortuosity of the descending thoracic aorta. The bony thorax is unremarkable.  IMPRESSION: Left lower lobe infiltrate is consistent with pneumonia. Follow-up films following therapy are recommended to assure complete clearing. Chest CT scanning [place] be ultimately indicated if the patient's symptoms persist.   Electronically Signed   By: David  Martinique   On: 03/17/2014 14:15    Cardiac Studies:  ECG:  NSR normal    Telemetry: NSR no arrhythmia   Echo:   Medications:   . amLODipine  5 mg Oral Daily  . aspirin EC  325 mg Oral Daily  . enoxaparin (LOVENOX) injection  40 mg Subcutaneous Q24H  . insulin aspart  0-15 Units Subcutaneous TID WC  . insulin aspart  0-5 Units Subcutaneous QHS  . insulin aspart  7 Units Subcutaneous BID WC  . insulin glargine  12 Units Subcutaneous Daily  . loratadine  10 mg Oral Daily  . multivitamin with  minerals  1 tablet Oral Daily  . pantoprazole  40 mg Oral Daily  . pravastatin  40 mg Oral q1800  . ramipril  5 mg Oral Daily       Assessment/Plan:  Chest Pain:  Atypical r/o normal ECG  Per Dr Mare Ferrari stress test this am  Ok to d/c home if normal Pulm:  Doubt pneumonia  F/u PA and Lateral CXR ordered GERD:  Continue pantoprazole HTN:  On ACE Chol:  Continue statin   D/C home if myovue normal   Jenkins Rouge 03/18/2014, 9:07 AM

## 2014-03-19 ENCOUNTER — Observation Stay (HOSPITAL_COMMUNITY): Payer: Medicare Other

## 2014-03-19 DIAGNOSIS — E785 Hyperlipidemia, unspecified: Secondary | ICD-10-CM | POA: Diagnosis not present

## 2014-03-19 DIAGNOSIS — R072 Precordial pain: Secondary | ICD-10-CM | POA: Diagnosis not present

## 2014-03-19 DIAGNOSIS — R079 Chest pain, unspecified: Secondary | ICD-10-CM | POA: Diagnosis not present

## 2014-03-19 DIAGNOSIS — E119 Type 2 diabetes mellitus without complications: Secondary | ICD-10-CM | POA: Diagnosis not present

## 2014-03-19 LAB — GLUCOSE, CAPILLARY
Glucose-Capillary: 218 mg/dL — ABNORMAL HIGH (ref 70–99)
Glucose-Capillary: 287 mg/dL — ABNORMAL HIGH (ref 70–99)

## 2014-03-19 MED ORDER — REGADENOSON 0.4 MG/5ML IV SOLN
0.4000 mg | Freq: Once | INTRAVENOUS | Status: AC
  Administered 2014-03-19: 0.4 mg via INTRAVENOUS
  Filled 2014-03-19: qty 5

## 2014-03-19 MED ORDER — TECHNETIUM TC 99M SESTAMIBI GENERIC - CARDIOLITE
30.0000 | Freq: Once | INTRAVENOUS | Status: AC | PRN
  Administered 2014-03-19: 30 via INTRAVENOUS

## 2014-03-19 MED ORDER — TECHNETIUM TC 99M SESTAMIBI GENERIC - CARDIOLITE
10.0000 | Freq: Once | INTRAVENOUS | Status: AC | PRN
  Administered 2014-03-19: 10 via INTRAVENOUS

## 2014-03-19 MED ORDER — REGADENOSON 0.4 MG/5ML IV SOLN
INTRAVENOUS | Status: AC
  Filled 2014-03-19: qty 5

## 2014-03-19 MED ORDER — PANTOPRAZOLE SODIUM 40 MG PO TBEC: 40.0000 mg | DELAYED_RELEASE_TABLET | Freq: Every day | ORAL | Status: DC

## 2014-03-19 NOTE — Discharge Summary (Signed)
Physician Discharge Summary  Joel Keena Heishman Sr. ZOX:[id number] DOB: April 21, 1946 DOA: 03/17/2014  PCP: Joel Jones  Admit date: 03/17/2014 Discharge date: 03/19/2014  Time spent: Less than 30 minutes  Recommendations for Outpatient Follow-up:  1. Joel Jones, PCP in 1 week with repeat labs (CBC).  Discharge Diagnoses:  Principal Problem:   Chest pain Active Problems:   Diabetes mellitus without complication   Hyperlipidemia   Cerebrovascular disease   Pain in the chest   Discharge Condition: Improved & Stable  Diet recommendation: Heart healthy & diabetic diet.  Filed Weights   03/17/14 1814 03/18/14 0430 03/19/14 0451  Weight: 78.926 kg (174 lb) 85.14 kg (187 lb 11.2 oz) 83.553 kg (184 lb 3.2 oz)    History of present illness:  68 year old male with history of DM 2, dyslipidemia, nonobstructive CAD based on cardiac catheterization 2004, presented to the ED with complaints of epigastric/lower substernal chest discomfort which has been intermittent over the last few weeks but got worse on day of admission, minimal to no relief with Tums but was associated with some water brash, eventually resolved in the ED after IV morphine.  Hospital Course:    1. Atypical chest pain: troponin 3 negative. EKG without acute changes. Cardiology was consulted and performed stress test which was negative- results as below. Telemetry shows sinus bradycardia in the 40s-50s without arrhythmias. Chest pain is suspicious for GERD. Continue PPI. Currently without chest pain. Discussed with Cardiology/Joel Hager PA who has cleared for DC home.  2. Essential hypertension:Controlled. Continue amlodipine, HCTZ and Altase. 3. Dyslipidemia: Continue statins. 4. DM 2/IDDM: poorly controlled and follows with Dr. Delrae Jones, Endocrinology whom he saw 2 weeks ago. Continue prior home medications ( Glargine, Humalog, Metformin) and outpatient follow-up. He has stopped taking the study drug. A1C:  8.3 5. Abnormal chest x-ray: Read as left lower lobe infiltrate. However patient denies any respiratory symptoms i.e. Cough, dyspnea, fever or chills. Repeat chest x-ray negative. No antibiotics started. 6. Anemia: Op follow up with repeat CBC. No reported bleeding. 7. Asymptomatic sinus bradycardia: not on rate control medications. Joel Jones consider checking TSH as outpatient if not already done.  Consultations:  Cardiology  Procedures:  None    Discharge Exam:  Complaints:  Denies complaints. No chest pain.  Filed Vitals:   03/19/14 0844 03/19/14 0846 03/19/14 1001 03/19/14 1151  BP: 149/70 140/70 128/74 118/62  Pulse:   48 45  Temp:   97.9 F (36.6 C) 97.9 F (36.6 C)  TempSrc:   Oral Oral  Resp:    18  Height:      Weight:      SpO2:   96% 97%   General exam: pleasant middle-aged male lying comfortably in bed. Respiratory system: Clear. No increased work of breathing. Cardiovascular system: S1 & S2 heard, RRR. No JVD, murmurs, gallops, clicks or pedal edema.telemetry: Sinus bradycardia in the 40s-50s. Gastrointestinal system: Abdomen is nondistended, soft and nontender. Normal bowel sounds heard. Central nervous system: Alert and oriented. No focal neurological deficits. Extremities: Symmetric 5 x 5 power.  Discharge Instructions      Discharge Instructions    Call Jones for:  severe uncontrolled pain    Complete by:  As directed      Diet - low sodium heart healthy    Complete by:  As directed      Diet Carb Modified    Complete by:  As directed      Increase activity slowly    Complete by:  As directed             Medication List    STOP taking these medications        STUDY MEDICATION      TAKE these medications        amLODipine 5 MG tablet  Commonly known as:  NORVASC  Take 5 mg by mouth daily.     aspirin 81 MG tablet  Take 81 mg by mouth daily.     BD INSULIN SYRINGE ULTRAFINE 31G X 15/64" 0.3 ML Misc  Generic drug:  Insulin Syringe-Needle  U-100     cetirizine 10 MG tablet  Commonly known as:  ZYRTEC  Take 10 mg by mouth daily.     HUMALOG KWIKPEN 100 UNIT/ML KiwkPen  Generic drug:  insulin lispro  Inject 7 Units into the skin 2 (two) times daily with a meal. Plus 1 extra unit for every 50 mg/dl above 100mg /dl     hydrochlorothiazide 12.5 MG tablet  Commonly known as:  HYDRODIURIL  Take 1 tablet by mouth every morning.     Krill Oil 1000 MG Caps  Take 1,000 mg by mouth daily.     metFORMIN 1000 MG tablet  Commonly known as:  GLUCOPHAGE  Take 1,000 mg by mouth 2 (two) times daily with a meal.     multivitamin with minerals Tabs tablet  Take 1 tablet by mouth daily.     pantoprazole 40 MG tablet  Commonly known as:  PROTONIX  Take 1 tablet (40 mg total) by mouth daily.     pravastatin 40 MG tablet  Commonly known as:  PRAVACHOL  Take 40 mg by mouth daily.     ramipril 5 MG capsule  Commonly known as:  ALTACE  Take 5 mg by mouth daily.     TOUJEO SOLOSTAR 300 UNIT/ML Sopn  Generic drug:  Insulin Glargine  Inject 14 Units into the skin daily.     vitamin E 400 UNIT capsule  Take 400 Units by mouth daily.       Follow-up Information    Follow up with FULP, CAMMIE, [name] Schedule an appointment as soon as possible for a visit in 1 week.   Specialty:  Family Medicine   Why:  to be seen with repeat labs (CBC).   Contact information:   [address] [place] [address] [phone]        The results of significant diagnostics from this hospitalization (including imaging, microbiology, ancillary and laboratory) are listed below for reference.    Significant Diagnostic Studies: Dg Chest 2 View  03/18/2014   CLINICAL DATA:  Mid chest and epigastric discomfort; history of hiatal hernia and reflux and chronic bronchitis  EXAM: CHEST  2 VIEW  COMPARISON:  PA and lateral chest of March 17, 2014  FINDINGS: The lungs are mildly hypoinflated. There is no focal infiltrate. The heart and  pulmonary vascularity are within the limits of normal. There are calcifications in the right hilar region which are stable. There is tortuosity of the descending thoracic aorta. There is no pleural effusion or pneumothorax. There are degenerative changes of the mid and lower lthoracic disc spaces.  IMPRESSION: There is mild pulmonary hypoinflation. There is no acute cardiopulmonary abnormality.   Electronically Signed   By: David  Martinique   On: 03/18/2014 11:56   Dg Chest 2 View  03/17/2014   CLINICAL DATA:  Chest tightness, shortness of breath, and chest pain which the patient associates with  his hiatal hernia  EXAM: CHEST  2 VIEW  COMPARISON:  None.  FINDINGS: The lungs are well-expanded. There is abnormal increase density in the left lower lobe. The heart and pulmonary vascularity are normal. There is mild tortuosity of the descending thoracic aorta. The bony thorax is unremarkable.  IMPRESSION: Left lower lobe infiltrate is consistent with pneumonia. Follow-up films following therapy are recommended to assure complete clearing. Chest CT scanning Methot be ultimately indicated if the patient's symptoms persist.   Electronically Signed   By: David  Martinique   On: 03/17/2014 14:15   Nm Myocar Multi W/spect W/wall Motion / Ef  03/19/2014   CLINICAL DATA:  Chest pain. History of anemia, hyperlipidemia and type 2 diabetes.  EXAM: MYOCARDIAL IMAGING WITH SPECT (REST AND PHARMACOLOGIC-STRESS)  GATED LEFT VENTRICULAR WALL MOTION STUDY  LEFT VENTRICULAR EJECTION FRACTION  TECHNIQUE: Standard myocardial SPECT imaging was performed after resting intravenous injection of 10 mCi Tc-35m sestamibi. Subsequently, intravenous infusion of Lexiscan was performed under the supervision of the Cardiology staff. At peak effect of the drug, 30 mCi Tc-84m sestamibi was injected intravenously and standard myocardial SPECT imaging was performed. Quantitative gated imaging was also performed to evaluate left ventricular wall motion, and  estimate left ventricular ejection fraction.  COMPARISON:  Chest radiograph -03/18/2014  FINDINGS: Raw images: No significant patient motion artifact. There is mild GI attenuation, worse on the provided rest images.  Perfusion: There is a minimal amount of attenuation involving the left ventricular apex which improves on the provided stress images and are without associated regional wall motion abnormality. No scintigraphic evidence of prior infarction or pharmacologically induced ischemia.  Wall Motion: Normal wall motion.  Left Ventricular Ejection Fraction: 63 %  End diastolic volume 124 ml  End systolic volume 52 ml  IMPRESSION: 1. No scintigraphic evidence of prior infarction or pharmacologically induced ischemia.  2. Normal left ventricular wall motion.  3. Left ventricular ejection fraction 63%  4. Low-risk stress test findings*.  *2012 Appropriate Use Criteria for Coronary Revascularization Focused Update: J Am Coll Cardiol. 5809;98(3):382-505. http://content.airportbarriers.com.aspx?articleid=1201161   Electronically Signed   By: Sandi Mariscal M.D.   On: 03/19/2014 13:46    Microbiology: No results found for this or any previous visit (from the past 240 hour(s)).   Labs: Basic Metabolic Panel:  Recent Labs Lab 03/17/14 1435 03/17/14 1443 03/18/14 0054  NA 137 139 137  K 3.9 3.8 4.0  CL 97 98 100  CO2 26  --  26  GLUCOSE 163* 170* 259*  BUN 11 10 14   CREATININE 0.83 0.90 0.99  CALCIUM 9.3  --  8.8   Liver Function Tests: No results for input(s): AST, ALT, ALKPHOS, BILITOT, PROT, ALBUMIN in the last 168 hours. No results for input(s): LIPASE, AMYLASE in the last 168 hours. No results for input(s): AMMONIA in the last 168 hours. CBC:  Recent Labs Lab 03/17/14 1435 03/17/14 1443 03/18/14 0054  WBC 9.1  --  4.9  HGB 12.7* 13.6 11.7*  HCT 37.3* 40.0 34.2*  MCV 90.3  --  88.4  PLT 276  --  259   Cardiac Enzymes:  Recent Labs Lab 03/17/14 1837 03/17/14 2044  03/18/14 0054  TROPONINI <0.30 <0.30 <0.30   BNP: BNP (last 3 results) No results for input(s): PROBNP in the last 8760 hours. CBG:  Recent Labs Lab 03/18/14 1646 03/18/14 2012 03/18/14 2238 03/19/14 1000 03/19/14 1148  GLUCAP 192* 329* 274* 218* 287*       Signed:  Vernell Leep, Jones,  FACP, FHM. [facility] Pager [phone]  If 7PM-7AM, please contact night-coverage [email or web address] Password [email or web address] 03/19/2014, 3:03 PM

## 2014-03-19 NOTE — Progress Notes (Signed)
Inpatient Diabetes Program Recommendations  AACE/ADA: New Consensus Statement on Inpatient Glycemic Control (2013)  Target Ranges:  Prepandial:   less than 140 mg/dL      Peak postprandial:   less than 180 mg/dL (1-2 hours)      Critically ill patients:  140 - 180 mg/dL   Results for Joel Jones, Joel SR. (MRN [id number]) as of 03/19/2014 10:31  Ref. Range 03/18/2014 16:46 03/18/2014 20:12 03/18/2014 22:38 03/19/2014 10:00  Glucose-Capillary Latest Range: 70-99 mg/dL 192 (H) 329 (H) 274 (H) 218 (H)    Reason for Visit: Elevated CBG  Diabetes history: Type 2 Outpatient Diabetes medications: Humalog 7 units bid, Lantus 11-16 units /day, Glucogphage 1000mg  bid, Tuejeo 14 units/day Current orders for Inpatient glycemic control: Lantus 12 units/day, Novolog correction 0-15  Units with meals, Novolog correction 0-5 units qhs,  Novolog 7 units bid with meals (breakfast and supper)  [place] want to consider increasing Lantus insulin to 20 units based on current blood sugars.  Note that Tuejeo is 3x concentrated Lantus insulin so patient is taking 14 units Tuejeo (14 x 3) plus 11-16 units of Lantus/day at home = 53- 58 units/day basal insulin.  Gentry Fitz, RN, BA, MHA, CDE Diabetes Coordinator Inpatient Diabetes Program  [phone] (Team Pager) [phone] [facility]) 03/19/2014 10:33 AM

## 2014-03-19 NOTE — Progress Notes (Signed)
UR completed 

## 2014-03-19 NOTE — Progress Notes (Signed)
Subjective: No complaints.   Objective: Vital signs in last 24 hours: Temp:  [97.8 F (36.6 C)-98.2 F (36.8 C)] 97.9 F (36.6 C) (11/05 0451) Pulse Rate:  [40-50] 40 (11/05 0451) Resp:  [18] 18 (11/04 1435) BP: (101-124)/(50-69) 124/69 mmHg (11/05 0826) SpO2:  [94 %-96 %] 96 % (11/05 0451) Weight:  [184 lb 3.2 oz (83.553 kg)] 184 lb 3.2 oz (83.553 kg) (11/05 0451) Last BM Date: 03/17/14  Intake/Output from previous day: 11/04 0701 - 11/05 0700 In: 240 [P.O.:240] Out: -  Intake/Output this shift:    Medications Current Facility-Administered Medications  Medication Dose Route Frequency Provider Last Rate Last Dose  . acetaminophen (TYLENOL) tablet 650 mg  650 mg Oral Q6H PRN Kelvin Cellar, MD   650 mg at 03/18/14 0445  . amLODipine (NORVASC) tablet 5 mg  5 mg Oral Daily Kelvin Cellar, MD   5 mg at 03/18/14 1023  . aspirin EC tablet 325 mg  325 mg Oral Daily Kelvin Cellar, MD   325 mg at 03/18/14 1022  . enoxaparin (LOVENOX) injection 40 mg  40 mg Subcutaneous Q24H Kelvin Cellar, MD   40 mg at 03/18/14 1835  . gi cocktail (Maalox,Lidocaine,Donnatal)  30 mL Oral QID PRN Kelvin Cellar, MD   30 mL at 03/18/14 0449  . insulin aspart (novoLOG) injection 0-15 Units  0-15 Units Subcutaneous TID WC Kelvin Cellar, MD   3 Units at 03/18/14 1759  . insulin aspart (novoLOG) injection 0-5 Units  0-5 Units Subcutaneous QHS Kelvin Cellar, MD   4 Units at 03/18/14 2134  . insulin aspart (novoLOG) injection 7 Units  7 Units Subcutaneous BID WC Kelvin Cellar, MD   7 Units at 03/18/14 1759  . insulin glargine (LANTUS) injection 12 Units  12 Units Subcutaneous Daily Kelvin Cellar, MD   12 Units at 03/18/14 1025  . loratadine (CLARITIN) tablet 10 mg  10 mg Oral Daily Kelvin Cellar, MD   10 mg at 03/18/14 1024  . morphine 2 MG/ML injection 2 mg  2 mg Intravenous Q2H PRN Kelvin Cellar, MD      . multivitamin with minerals tablet 1 tablet  1 tablet Oral Daily Kelvin Cellar,  MD   1 tablet at 03/18/14 1024  . nitroGLYCERIN (NITROSTAT) SL tablet 0.4 mg  0.4 mg Sublingual Q5 min PRN Kelvin Cellar, MD      . ondansetron (ZOFRAN) injection 4 mg  4 mg Intravenous Q6H PRN Kelvin Cellar, MD      . pantoprazole (PROTONIX) EC tablet 40 mg  40 mg Oral Daily Kelvin Cellar, MD   40 mg at 03/18/14 1021  . pravastatin (PRAVACHOL) tablet 40 mg  40 mg Oral q1800 Kelvin Cellar, MD   40 mg at 03/18/14 2135  . ramipril (ALTACE) capsule 5 mg  5 mg Oral Daily Kelvin Cellar, MD   5 mg at 03/18/14 1023  . regadenoson (LEXISCAN) 0.4 MG/5ML injection SOLN             PE: General appearance: alert, cooperative and no distress Lungs: clear to auscultation bilaterally Heart: Reg rhythm,  Rate slow Extremities: No LEE Pulses: 2+ and symmetric Skin: WArm and dry Neurologic: Grossly normal  Lab Results:   Recent Labs  03/17/14 1435 03/17/14 1443 03/18/14 0054  WBC 9.1  --  4.9  HGB 12.7* 13.6 11.7*  HCT 37.3* 40.0 34.2*  PLT 276  --  259   BMET  Recent Labs  03/17/14 1435 03/17/14 1443 03/18/14 0054  NA 137  139 137  K 3.9 3.8 4.0  CL 97 98 100  CO2 26  --  26  GLUCOSE 163* 170* 259*  BUN 11 10 14   CREATININE 0.83 0.90 0.99  CALCIUM 9.3  --  8.8     Assessment/Plan    Principal Problem:   Chest pain Active Problems:   Diabetes mellitus without complication   Hyperlipidemia   Cerebrovascular disease   Pain in the chest  Plan:  Ruled out for MI.  He tolerated the lexiscan well but with 5/10 chest pressure.  Interpretation to follow.  BP stable/controlled.   LOS: 2 days    HAGER, BRYAN PA-C 03/19/2014 8:37 AM  Patient examined chart reviewed  Chest pain with lexiscan nonspecific finding.  Cath in am if myovue abnormal  Jenkins Rouge

## 2014-03-30 ENCOUNTER — Other Ambulatory Visit: Payer: Self-pay | Admitting: Physician Assistant

## 2014-03-30 DIAGNOSIS — R634 Abnormal weight loss: Secondary | ICD-10-CM | POA: Diagnosis not present

## 2014-03-30 DIAGNOSIS — R1011 Right upper quadrant pain: Secondary | ICD-10-CM

## 2014-03-30 DIAGNOSIS — R110 Nausea: Secondary | ICD-10-CM | POA: Diagnosis not present

## 2014-03-30 DIAGNOSIS — R1314 Dysphagia, pharyngoesophageal phase: Secondary | ICD-10-CM | POA: Diagnosis not present

## 2014-03-30 DIAGNOSIS — R1013 Epigastric pain: Secondary | ICD-10-CM | POA: Diagnosis not present

## 2014-04-02 ENCOUNTER — Other Ambulatory Visit: Payer: Self-pay | Admitting: Physician Assistant

## 2014-04-02 ENCOUNTER — Ambulatory Visit
Admission: RE | Admit: 2014-04-02 | Discharge: 2014-04-02 | Disposition: A | Discharge status: Home / Self Care | Payer: Medicare Other | Source: Ambulatory Visit | Attending: Physician Assistant | Admitting: Physician Assistant

## 2014-04-02 DIAGNOSIS — R1011 Right upper quadrant pain: Secondary | ICD-10-CM | POA: Diagnosis not present

## 2014-04-02 DIAGNOSIS — N281 Cyst of kidney, acquired: Secondary | ICD-10-CM | POA: Diagnosis not present

## 2014-04-02 DIAGNOSIS — R1013 Epigastric pain: Secondary | ICD-10-CM | POA: Diagnosis not present

## 2014-04-03 ENCOUNTER — Ambulatory Visit
Admission: RE | Admit: 2014-04-03 | Discharge: 2014-04-03 | Disposition: A | Discharge status: Home / Self Care | Payer: Medicare Other | Source: Ambulatory Visit | Attending: Diagnostic Neuroimaging | Admitting: Diagnostic Neuroimaging

## 2014-04-03 DIAGNOSIS — M542 Cervicalgia: Secondary | ICD-10-CM | POA: Diagnosis not present

## 2014-04-03 DIAGNOSIS — R51 Headache: Secondary | ICD-10-CM | POA: Diagnosis not present

## 2014-04-03 DIAGNOSIS — G44039 Episodic paroxysmal hemicrania, not intractable: Secondary | ICD-10-CM

## 2014-04-03 DIAGNOSIS — G4486 Cervicogenic headache: Secondary | ICD-10-CM

## 2014-04-06 DIAGNOSIS — M542 Cervicalgia: Secondary | ICD-10-CM | POA: Diagnosis not present

## 2014-04-06 DIAGNOSIS — M47812 Spondylosis without myelopathy or radiculopathy, cervical region: Secondary | ICD-10-CM | POA: Diagnosis not present

## 2014-04-08 ENCOUNTER — Other Ambulatory Visit: Payer: Medicare Other

## 2014-04-08 DIAGNOSIS — R1310 Dysphagia, unspecified: Secondary | ICD-10-CM | POA: Diagnosis not present

## 2014-04-15 DIAGNOSIS — S61432A Puncture wound without foreign body of left hand, initial encounter: Secondary | ICD-10-CM | POA: Diagnosis not present

## 2014-04-15 DIAGNOSIS — L03114 Cellulitis of left upper limb: Secondary | ICD-10-CM | POA: Diagnosis not present

## 2014-04-15 DIAGNOSIS — K219 Gastro-esophageal reflux disease without esophagitis: Secondary | ICD-10-CM | POA: Diagnosis not present

## 2014-04-20 ENCOUNTER — Ambulatory Visit: Payer: PRIVATE HEALTH INSURANCE | Admitting: Diagnostic Neuroimaging

## 2014-04-27 NOTE — Progress Notes (Signed)
HPI: FU CAD; cardiac catheterization in 2004 showed nonobstructive disease. His ejection fraction was 50% based on previous notes from Dr. Verl Blalock. Chest CT in November of 2013 showed scattered atherosclerotic calcifications in the aorta and coronary arteries. Stress echocardiogram in November of 2013 showed normal LV function and no ischemia. Carotid Dopplers in November of 2014 showed normal carotids. Chest pain in November 2015. Abdominal ultrasound showed no aneurysm and no acute hepatobiliary findings. Nuclear study November 2015 showed an ejection fraction of 63% and no ischemia or infarction. Since discharge the patient has dyspnea with more extreme activities but not with routine activities. It is relieved with rest. It is not associated with chest pain. There is no orthopnea, PND or pedal edema. There is no syncope or palpitations. There is no exertional chest pain.   Current Outpatient Prescriptions  Medication Sig Dispense Refill  . amLODipine (NORVASC) 5 MG tablet Take 5 mg by mouth daily.    Marland Kitchen amoxicillin-clavulanate (AUGMENTIN) 500-125 MG per tablet   0  . aspirin 81 MG tablet Take 81 mg by mouth daily.    . BD INSULIN SYRINGE ULTRAFINE 31G X 15/64" 0.3 ML MISC     . cetirizine (ZYRTEC) 10 MG tablet Take 10 mg by mouth daily.    Marland Kitchen gabapentin (NEURONTIN) 300 MG capsule Take 300 mg by mouth at bedtime as needed.  1  . HUMALOG KWIKPEN 100 UNIT/ML KiwkPen Inject 7 Units into the skin 2 (two) times daily with a meal. Plus 1 extra unit for every 50 mg/dl above 100mg /dl    . hydrochlorothiazide (HYDRODIURIL) 12.5 MG tablet Take 1 tablet by mouth every morning.    Marland Kitchen KRILL OIL 1000 MG CAPS Take 1,000 mg by mouth daily.    . metFORMIN (GLUCOPHAGE) 1000 MG tablet Take 1,000 mg by mouth 2 (two) times daily with a meal.    . Multiple Vitamin (MULTIVITAMIN WITH MINERALS) TABS Take 1 tablet by mouth daily.    . pantoprazole (PROTONIX) 40 MG tablet Take 1 tablet (40 mg total) by mouth daily. 30  tablet 0  . pravastatin (PRAVACHOL) 40 MG tablet Take 40 mg by mouth daily.    . ramipril (ALTACE) 5 MG capsule Take 5 mg by mouth daily.    Nelva Nay SOLOSTAR 300 UNIT/ML SOPN Inject 14 Units into the skin daily.     . vitamin E 400 UNIT capsule Take 400 Units by mouth daily.     No current facility-administered medications for this visit.     Past Medical History  Diagnosis Date  . Hyperlipidemia   . Hiatal hernia   . GERD (gastroesophageal reflux disease)   . Pneumonia 1972 X 1  . Chronic bronchitis     "get it just about q yr" (03/17/2014)  . Type II diabetes mellitus   . Anemia   . History of blood transfusion 2008    "related to OR"  . Daily headache     "here lately" (03/17/2014)  . Arthritis     "all over"    Past Surgical History  Procedure Laterality Date  . Knee arthroscopy Bilateral 1980's  . Inguinal hernia repair Right ?2010  . Total knee arthroplasty Left 2003  . Revision total knee arthroplasty Left 2008  . Cystectomy Left 2009    "behind knee"  . Joint replacement      History   Social History  . Marital Status: Married    Spouse Name: Abe People    Number of Children:  3  . Years of Education: College   Occupational History  .     Social History Main Topics  . Smoking status: Former Smoker -- 2.00 packs/day for 4 years    Types: Cigarettes    Quit date: 05/15/1968  . Smokeless tobacco: Never Used  . Alcohol Use: No  . Drug Use: No  . Sexual Activity: Yes   Other Topics Concern  . Not on file   Social History Narrative   Patient lives at home with family.   Caffeine Use: occasionally    ROS: no fevers or chills, productive cough, hemoptysis, dysphasia, odynophagia, melena, hematochezia, dysuria, hematuria, rash, seizure activity, orthopnea, PND, pedal edema, claudication. Remaining systems are negative.  Physical Exam: Well-developed well-nourished in no acute distress.  Skin is warm and dry.  HEENT is normal.  Neck is supple.  Chest  is clear to auscultation with normal expansion.  Cardiovascular exam is regular rate and rhythm.  Abdominal exam nontender or distended. No masses palpated. Extremities show no edema. neuro grossly intact  ECG Mar 17 2014-sinus bradycardia with no ST changes.

## 2014-05-01 ENCOUNTER — Ambulatory Visit (INDEPENDENT_AMBULATORY_CARE_PROVIDER_SITE_OTHER): Payer: Medicare Other | Admitting: Cardiology

## 2014-05-01 ENCOUNTER — Encounter: Payer: Self-pay | Admitting: Cardiology

## 2014-05-01 VITALS — BP 114/70 | HR 48 | Ht 66.0 in | Wt 195.0 lb

## 2014-05-01 DIAGNOSIS — I2583 Coronary atherosclerosis due to lipid rich plaque: Principal | ICD-10-CM

## 2014-05-01 DIAGNOSIS — E785 Hyperlipidemia, unspecified: Secondary | ICD-10-CM

## 2014-05-01 DIAGNOSIS — I2510 Atherosclerotic heart disease of native coronary artery without angina pectoris: Secondary | ICD-10-CM | POA: Diagnosis not present

## 2014-05-01 NOTE — Assessment & Plan Note (Signed)
Continue statin. 

## 2014-05-01 NOTE — Assessment & Plan Note (Signed)
Continue aspirin and statin. 

## 2014-05-01 NOTE — Patient Instructions (Signed)
Your physician wants you to follow-up in: ONE YEAR WITH DR CRENSHAW You will receive a reminder letter in the mail two months in advance. If you don't receive a letter, please call our office to schedule the follow-up appointment.  

## 2014-05-04 DIAGNOSIS — M542 Cervicalgia: Secondary | ICD-10-CM | POA: Diagnosis not present

## 2014-05-04 DIAGNOSIS — M47812 Spondylosis without myelopathy or radiculopathy, cervical region: Secondary | ICD-10-CM | POA: Diagnosis not present

## 2014-06-02 DIAGNOSIS — E1122 Type 2 diabetes mellitus with diabetic chronic kidney disease: Secondary | ICD-10-CM | POA: Diagnosis not present

## 2014-06-02 DIAGNOSIS — N181 Chronic kidney disease, stage 1: Secondary | ICD-10-CM | POA: Diagnosis not present

## 2014-06-08 ENCOUNTER — Ambulatory Visit (INDEPENDENT_AMBULATORY_CARE_PROVIDER_SITE_OTHER): Payer: Medicare Other | Admitting: Diagnostic Neuroimaging

## 2014-06-08 ENCOUNTER — Encounter: Payer: Self-pay | Admitting: Diagnostic Neuroimaging

## 2014-06-08 VITALS — BP 126/66 | HR 60 | Temp 97.6°F | Ht 66.0 in | Wt 196.8 lb

## 2014-06-08 DIAGNOSIS — R51 Headache: Secondary | ICD-10-CM | POA: Diagnosis not present

## 2014-06-08 DIAGNOSIS — G4486 Cervicogenic headache: Secondary | ICD-10-CM

## 2014-06-08 NOTE — Patient Instructions (Signed)
Follow up as needed

## 2014-06-08 NOTE — Progress Notes (Signed)
[facility]  PATIENT: Joel Pompey Garin Sr. DOB: [birth year]  REFERRING CLINICIAN: Fulp HISTORY FROM: patient and wife REASON FOR VISIT: follow up   HISTORICAL  CHIEF COMPLAINT:  Chief Complaint  Patient presents with  . Follow-up    headaches and test results     HISTORY OF PRESENT ILLNESS:   UPDATE 06/08/14: Since last visit, was admitted for stomach/chest pain eval, dx'd ultimately with atypical chest pain related to GERD. Headaches are stable, and continue to arise from left posterior neck. Not severe enough to take anything for it. He tried gabapentin for 1-2 weeks in the pain, but couldn't tolerate (caused nausea).   PRIOR HPI (03/17/14): 69 year old with history of diabetes, hyperlipidemia, hiatal hernia, here for evaluation of headaches. Patient reports generalized dull headaches for past 1 month, without nausea, vomiting, photophobia or phonophobia. Patient reports some neck pain. Sometimes headache radiates from neck pain, left posterior auricular pain, left temporal and left frontal pain. Headaches are almost on a daily basis. These are punctuated by intermittent spasms in attacks and head. Patient has had MRI of cervical spine at orthopedic office but does not no results. No similar headaches in the past. No other triggering factors. Of note today patient complaining of severe midline upper abdominal pain, which he states is related to a known hiatal hernia which seems to be "acting up". Patient appears very uncomfortable in the exam chair, and he and his wife are awaiting a callback from PCP.   REVIEW OF SYSTEMS: Full 14 system review of systems performed and notable only for neck pain neck stiff headache.   ALLERGIES: Allergies  Allergen Reactions  . Codeine Swelling  . Roxicodone [Oxycodone] Itching    HOME MEDICATIONS: Outpatient Prescriptions Prior to Visit  Medication Sig Dispense Refill  . amLODipine (NORVASC) 5 MG tablet Take 5 mg by mouth  daily.    Marland Kitchen aspirin 81 MG tablet Take 81 mg by mouth daily.    . BD INSULIN SYRINGE ULTRAFINE 31G X 15/64" 0.3 ML MISC     . HUMALOG KWIKPEN 100 UNIT/ML KiwkPen Inject 7 Units into the skin 2 (two) times daily with a meal. Plus 1 extra unit for every 50 mg/dl above 100mg /dl    . metFORMIN (GLUCOPHAGE) 1000 MG tablet Take 1,000 mg by mouth 2 (two) times daily with a meal.    . Multiple Vitamin (MULTIVITAMIN WITH MINERALS) TABS Take 1 tablet by mouth daily.    . pantoprazole (PROTONIX) 40 MG tablet Take 1 tablet (40 mg total) by mouth daily. 30 tablet 0  . pravastatin (PRAVACHOL) 40 MG tablet Take 40 mg by mouth daily.    Nelva Nay SOLOSTAR 300 UNIT/ML SOPN Inject 14 Units into the skin daily.     . vitamin E 400 UNIT capsule Take 400 Units by mouth daily.    . cetirizine (ZYRTEC) 10 MG tablet Take 10 mg by mouth daily.    . hydrochlorothiazide (HYDRODIURIL) 12.5 MG tablet Take 1 tablet by mouth every morning.    Marland Kitchen KRILL OIL 1000 MG CAPS Take 1,000 mg by mouth daily.    . ramipril (ALTACE) 5 MG capsule Take 5 mg by mouth daily.    Marland Kitchen amoxicillin-clavulanate (AUGMENTIN) 500-125 MG per tablet   0  . gabapentin (NEURONTIN) 300 MG capsule Take 300 mg by mouth at bedtime as needed.  1   No facility-administered medications prior to visit.    PAST MEDICAL HISTORY: Past Medical History  Diagnosis Date  . Hyperlipidemia   .  Hiatal hernia   . GERD (gastroesophageal reflux disease)   . Pneumonia 1972 X 1  . Chronic bronchitis     "get it just about q yr" (03/17/2014)  . Type II diabetes mellitus   . Anemia   . History of blood transfusion 2008    "related to OR"  . Daily headache     "here lately" (03/17/2014)  . Arthritis     "all over"    PAST SURGICAL HISTORY: Past Surgical History  Procedure Laterality Date  . Knee arthroscopy Bilateral 1980's  . Inguinal hernia repair Right ?2010  . Total knee arthroplasty Left 2003  . Revision total knee arthroplasty Left 2008  . Cystectomy Left  2009    "behind knee"  . Joint replacement      FAMILY HISTORY: Family History  Problem Relation Age of Onset  . Heart disease      No family history  . Cancer Father     prostate    SOCIAL HISTORY:  History   Social History  . Marital Status: Married    Spouse Name: Abe People    Number of Children: 3  . Years of Education: College   Occupational History  .     Social History Main Topics  . Smoking status: Former Smoker -- 2.00 packs/day for 4 years    Types: Cigarettes    Quit date: 05/15/1968  . Smokeless tobacco: Never Used  . Alcohol Use: No  . Drug Use: No  . Sexual Activity: Yes   Other Topics Concern  . Not on file   Social History Narrative   Patient lives at home with family.   Caffeine Use: occasionally     PHYSICAL EXAM  Filed Vitals:   06/08/14 1005  BP: 126/66  Pulse: 60  Temp: 97.6 F (36.4 C)  TempSrc: Oral  Height: 5\' 6"  (1.676 m)  Weight: 196 lb 12.8 oz (89.268 kg)    Not recorded     No exam data present   Body mass index is 31.78 kg/(m^2).  GENERAL EXAM: Patient is well developed, nourished and groomed; neck is supple; no distress  CARDIOVASCULAR: Regular rate and rhythm, no murmurs, no carotid bruits  NEUROLOGIC: MENTAL STATUS: awake, alert, language fluent, comprehension intact, naming intact, fund of knowledge appropriate CRANIAL NERVE: pupils equal and reactive to light, visual fields full to confrontation, extraocular muscles intact, no nystagmus, facial sensation and strength symmetric, hearing intact, palate elevates symmetrically, uvula midline, shoulder shrug symmetric, tongue midline. MOTOR: normal bulk and tone, full strength in the BUE, BLE SENSORY: normal and symmetric to light touch COORDINATION: finger-nose-finger, fine finger movements normal REFLEXES: deep tendon reflexes present and TRACE GAIT/STATION: narrow based gait    DIAGNOSTIC DATA (LABS, IMAGING, TESTING) - I reviewed patient records, labs,  notes, testing and imaging myself where available.  Lab Results  Component Value Date   WBC 4.9 03/18/2014   HGB 11.7* 03/18/2014   HCT 34.2* 03/18/2014   MCV 88.4 03/18/2014   PLT 259 03/18/2014      Component Value Date/Time   NA 137 03/18/2014 0054   K 4.0 03/18/2014 0054   CL 100 03/18/2014 0054   CO2 26 03/18/2014 0054   GLUCOSE 259* 03/18/2014 0054   BUN 14 03/18/2014 0054   CREATININE 0.99 03/18/2014 0054   CREATININE 1.10 08/19/2012 0921   CALCIUM 8.8 03/18/2014 0054   PROT 6.7 05/14/2012 0735   ALBUMIN 4.0 05/14/2012 0735   AST 21 05/14/2012 0735   ALT  22 05/14/2012 0735   ALKPHOS 59 05/14/2012 0735   BILITOT 0.7 05/14/2012 0735   GFRNONAA 83* 03/18/2014 0054   GFRAA >90 03/18/2014 0054   Lab Results  Component Value Date   CHOL 115 05/14/2012   HDL 28.70* 05/14/2012   LDLCALC 68 05/14/2012   TRIG 93.0 05/14/2012   CHOLHDL 4 05/14/2012   Lab Results  Component Value Date   HGBA1C 8.3* 03/17/2014   No results found for: VITAMINB12 No results found for: TSH   I reviewed images myself and agree with interpretation. -VRP  04/03/14 MRI brain (without) demonstrating: 1. Few (<5) small non-specific foci of non-specific T2 hyperintensities in the periventricular, subcortical and juxtacortical white matter. These findings are non-specific and considerations include autoimmune, inflammatory, post-infectious, microvascular ischemic or migraine associated etiologies.  2. No acute findings.    ASSESSMENT AND PLAN  69 y.o. year old male here with cervicogenic headaches since Oct 2015. Symptoms are mild, self-limited. Neuro exam and MRI brain unremarkable.   PLAN: - monitor symptoms; Louk use tylenol prn HA/pain  Return if symptoms worsen or fail to improve, for return to PCP.    Penni Bombard, MD 0/94/0768, 08:81 AM Certified in Neurology, Neurophysiology and Neuroimaging  [facility] [address] [place], [place]  [address] [phone]

## 2014-06-29 ENCOUNTER — Other Ambulatory Visit: Payer: Self-pay | Admitting: Cardiology

## 2014-07-09 ENCOUNTER — Other Ambulatory Visit: Payer: Self-pay

## 2014-07-09 MED ORDER — PRAVASTATIN SODIUM 40 MG PO TABS: 40.0000 mg | ORAL_TABLET | Freq: Every day | ORAL | Status: DC

## 2014-07-09 NOTE — Addendum Note (Signed)
Addended by: Fidel Levy on: 07/09/2014 03:52 PM   Modules accepted: Orders

## 2014-07-09 NOTE — Telephone Encounter (Signed)
Phoned in Rx.

## 2014-08-11 DIAGNOSIS — J329 Chronic sinusitis, unspecified: Secondary | ICD-10-CM | POA: Diagnosis not present

## 2014-08-26 DIAGNOSIS — R05 Cough: Secondary | ICD-10-CM | POA: Diagnosis not present

## 2014-09-01 DIAGNOSIS — N181 Chronic kidney disease, stage 1: Secondary | ICD-10-CM | POA: Diagnosis not present

## 2014-09-01 DIAGNOSIS — Z794 Long term (current) use of insulin: Secondary | ICD-10-CM | POA: Diagnosis not present

## 2014-09-01 DIAGNOSIS — E1122 Type 2 diabetes mellitus with diabetic chronic kidney disease: Secondary | ICD-10-CM | POA: Diagnosis not present

## 2014-10-02 DIAGNOSIS — D225 Melanocytic nevi of trunk: Secondary | ICD-10-CM | POA: Diagnosis not present

## 2014-10-02 DIAGNOSIS — L820 Inflamed seborrheic keratosis: Secondary | ICD-10-CM | POA: Diagnosis not present

## 2014-10-02 DIAGNOSIS — D485 Neoplasm of uncertain behavior of skin: Secondary | ICD-10-CM | POA: Diagnosis not present

## 2014-10-02 DIAGNOSIS — L814 Other melanin hyperpigmentation: Secondary | ICD-10-CM | POA: Diagnosis not present

## 2014-10-07 DIAGNOSIS — M25511 Pain in right shoulder: Secondary | ICD-10-CM | POA: Diagnosis not present

## 2014-11-20 ENCOUNTER — Other Ambulatory Visit: Payer: Self-pay | Admitting: Cardiology

## 2014-11-20 NOTE — Telephone Encounter (Signed)
REFILL 

## 2014-11-23 DIAGNOSIS — E1122 Type 2 diabetes mellitus with diabetic chronic kidney disease: Secondary | ICD-10-CM | POA: Diagnosis not present

## 2014-11-23 DIAGNOSIS — Z794 Long term (current) use of insulin: Secondary | ICD-10-CM | POA: Diagnosis not present

## 2014-11-23 DIAGNOSIS — N182 Chronic kidney disease, stage 2 (mild): Secondary | ICD-10-CM | POA: Diagnosis not present

## 2014-12-11 DIAGNOSIS — Z794 Long term (current) use of insulin: Secondary | ICD-10-CM | POA: Diagnosis not present

## 2014-12-11 DIAGNOSIS — E1122 Type 2 diabetes mellitus with diabetic chronic kidney disease: Secondary | ICD-10-CM | POA: Diagnosis not present

## 2015-02-23 DIAGNOSIS — L600 Ingrowing nail: Secondary | ICD-10-CM | POA: Diagnosis not present

## 2015-02-23 DIAGNOSIS — L03032 Cellulitis of left toe: Secondary | ICD-10-CM | POA: Diagnosis not present

## 2015-04-01 DIAGNOSIS — E1122 Type 2 diabetes mellitus with diabetic chronic kidney disease: Secondary | ICD-10-CM | POA: Diagnosis not present

## 2015-04-01 DIAGNOSIS — Z794 Long term (current) use of insulin: Secondary | ICD-10-CM | POA: Diagnosis not present

## 2015-04-01 DIAGNOSIS — N182 Chronic kidney disease, stage 2 (mild): Secondary | ICD-10-CM | POA: Diagnosis not present

## 2015-04-05 DIAGNOSIS — E1122 Type 2 diabetes mellitus with diabetic chronic kidney disease: Secondary | ICD-10-CM | POA: Diagnosis not present

## 2015-04-05 DIAGNOSIS — Z794 Long term (current) use of insulin: Secondary | ICD-10-CM | POA: Diagnosis not present

## 2015-04-13 DIAGNOSIS — M25561 Pain in right knee: Secondary | ICD-10-CM | POA: Diagnosis not present

## 2015-04-14 DIAGNOSIS — Z794 Long term (current) use of insulin: Secondary | ICD-10-CM | POA: Diagnosis not present

## 2015-04-14 DIAGNOSIS — E1122 Type 2 diabetes mellitus with diabetic chronic kidney disease: Secondary | ICD-10-CM | POA: Diagnosis not present

## 2015-04-14 DIAGNOSIS — N182 Chronic kidney disease, stage 2 (mild): Secondary | ICD-10-CM | POA: Diagnosis not present

## 2015-05-05 NOTE — Progress Notes (Signed)
HPI: FU CAD; cardiac catheterization in 2004 showed nonobstructive disease. His ejection fraction was 50% based on previous notes from Dr. Verl Blalock. Chest CT in November of 2013 showed scattered atherosclerotic calcifications in the aorta and coronary arteries. Stress echocardiogram in November of 2013 showed normal LV function and no ischemia. Carotid Dopplers in November of 2014 showed normal carotids. Chest pain in November 2015. Abdominal ultrasound showed no aneurysm and no acute hepatobiliary findings. Nuclear study November 2015 showed an ejection fraction of 63% and no ischemia or infarction. Since last seen in Dec 2015, the patient has dyspnea with more extreme activities but not with routine activities. It is relieved with rest. It is not associated with chest pain. There is no orthopnea, PND or pedal edema. There is no syncope or palpitations. There is no exertional chest pain.   Current Outpatient Prescriptions  Medication Sig Dispense Refill  . amLODipine (NORVASC) 5 MG tablet Take 5 mg by mouth daily.    Marland Kitchen aspirin 81 MG tablet Take 81 mg by mouth daily.    . BD INSULIN SYRINGE ULTRAFINE 31G X 15/64" 0.3 ML MISC     . HUMALOG KWIKPEN 100 UNIT/ML KiwkPen Inject 7 Units into the skin 2 (two) times daily with a meal. Plus 1 extra unit for every 50 mg/dl above 100mg /dl    . hydrochlorothiazide (HYDRODIURIL) 12.5 MG tablet Take 1 tablet by mouth every morning.    Marland Kitchen KRILL OIL 1000 MG CAPS Take 1,000 mg by mouth daily.    . Multiple Vitamin (MULTIVITAMIN WITH MINERALS) TABS Take 1 tablet by mouth daily.    . pantoprazole (PROTONIX) 40 MG tablet Take 1 tablet (40 mg total) by mouth daily. 30 tablet 0  . pravastatin (PRAVACHOL) 40 MG tablet TAKE 1 TABLET EVERY EVENING 90 tablet 2  . ramipril (ALTACE) 5 MG capsule Take 5 mg by mouth daily.    Nelva Nay SOLOSTAR 300 UNIT/ML SOPN Inject 22 Units into the skin daily.     . vitamin E 400 UNIT capsule Take 400 Units by mouth daily.     No  current facility-administered medications for this visit.     Past Medical History  Diagnosis Date  . Hyperlipidemia   . Hiatal hernia   . GERD (gastroesophageal reflux disease)   . Pneumonia 1972 X 1  . Chronic bronchitis ([place])     "get it just about q yr" (03/17/2014)  . Type II diabetes mellitus ([place])   . Anemia   . History of blood transfusion 2008    "related to OR"  . Daily headache     "here lately" (03/17/2014)  . Arthritis     "all over"    Past Surgical History  Procedure Laterality Date  . Knee arthroscopy Bilateral 1980's  . Inguinal hernia repair Right ?2010  . Total knee arthroplasty Left 2003  . Revision total knee arthroplasty Left 2008  . Cystectomy Left 2009    "behind knee"  . Joint replacement      Social History   Social History  . Marital Status: Married    Spouse Name: Abe People  . Number of Children: 3  . Years of Education: College   Occupational History  .     Social History Main Topics  . Smoking status: Former Smoker -- 2.00 packs/day for 4 years    Types: Cigarettes    Quit date: 05/15/1968  . Smokeless tobacco: Never Used  . Alcohol Use: No  . Drug Use:  No  . Sexual Activity: Yes   Other Topics Concern  . Not on file   Social History Narrative   Patient lives at home with family.   Caffeine Use: occasionally    ROS: no fevers or chills, productive cough, hemoptysis, dysphasia, odynophagia, melena, hematochezia, dysuria, hematuria, rash, seizure activity, orthopnea, PND, pedal edema, claudication. Remaining systems are negative.  Physical Exam: Well-developed well-nourished in no acute distress.  Skin is warm and dry.  HEENT is normal.  Neck is supple.  Chest is clear to auscultation with normal expansion.  Cardiovascular exam is bradycardic and regular and rhythm.  Abdominal exam nontender or distended. No masses palpated. Extremities show no edema. neuro grossly intact  ECG Marked sinus bradycardia at a rate of 39. No  ST changes.

## 2015-05-11 ENCOUNTER — Ambulatory Visit (INDEPENDENT_AMBULATORY_CARE_PROVIDER_SITE_OTHER): Payer: Medicare Other | Admitting: Cardiology

## 2015-05-11 ENCOUNTER — Encounter: Payer: Self-pay | Admitting: Cardiology

## 2015-05-11 VITALS — BP 120/72 | HR 39 | Ht 69.0 in | Wt 195.7 lb

## 2015-05-11 DIAGNOSIS — R001 Bradycardia, unspecified: Secondary | ICD-10-CM | POA: Diagnosis not present

## 2015-05-11 DIAGNOSIS — I2510 Atherosclerotic heart disease of native coronary artery without angina pectoris: Secondary | ICD-10-CM | POA: Diagnosis not present

## 2015-05-11 DIAGNOSIS — E785 Hyperlipidemia, unspecified: Secondary | ICD-10-CM

## 2015-05-11 DIAGNOSIS — I10 Essential (primary) hypertension: Secondary | ICD-10-CM | POA: Diagnosis not present

## 2015-05-11 DIAGNOSIS — I2583 Coronary atherosclerosis due to lipid rich plaque: Principal | ICD-10-CM

## 2015-05-11 NOTE — Assessment & Plan Note (Signed)
Patient is noted to have marked sinus bradycardia today. He has had some bradycardia previously. He is not having symptoms. No indication for further therapy.I considered an exercise treadmill to evaluate chronotropic competence today. However he has severe knee arthralgias and will not be able to ambulate on a treadmill. Again with no symptoms I do not think we need to pursue further evaluation.

## 2015-05-11 NOTE — Assessment & Plan Note (Signed)
Continue statin. Lipids and liver monitored by primary care. 

## 2015-05-11 NOTE — Assessment & Plan Note (Signed)
Blood pressure controlled. Continue present medications. 

## 2015-05-11 NOTE — Patient Instructions (Addendum)
Your physician wants you to follow-up in: 1 Year. You will receive a reminder letter in the mail two months in advance. If you don't receive a letter, please call our office to schedule the follow-up appointment.         Happy New Year!!

## 2015-05-11 NOTE — Assessment & Plan Note (Signed)
Continue aspirin and statin. 

## 2015-07-13 DIAGNOSIS — E785 Hyperlipidemia, unspecified: Secondary | ICD-10-CM | POA: Diagnosis not present

## 2015-07-13 DIAGNOSIS — I10 Essential (primary) hypertension: Secondary | ICD-10-CM | POA: Diagnosis not present

## 2015-07-13 DIAGNOSIS — R001 Bradycardia, unspecified: Secondary | ICD-10-CM | POA: Diagnosis not present

## 2015-07-13 DIAGNOSIS — K219 Gastro-esophageal reflux disease without esophagitis: Secondary | ICD-10-CM | POA: Diagnosis not present

## 2015-07-13 DIAGNOSIS — Z125 Encounter for screening for malignant neoplasm of prostate: Secondary | ICD-10-CM | POA: Diagnosis not present

## 2015-07-28 DIAGNOSIS — Z794 Long term (current) use of insulin: Secondary | ICD-10-CM | POA: Diagnosis not present

## 2015-07-28 DIAGNOSIS — Z125 Encounter for screening for malignant neoplasm of prostate: Secondary | ICD-10-CM | POA: Diagnosis not present

## 2015-07-28 DIAGNOSIS — E1122 Type 2 diabetes mellitus with diabetic chronic kidney disease: Secondary | ICD-10-CM | POA: Diagnosis not present

## 2015-07-28 DIAGNOSIS — E1165 Type 2 diabetes mellitus with hyperglycemia: Secondary | ICD-10-CM | POA: Diagnosis not present

## 2015-07-28 DIAGNOSIS — N182 Chronic kidney disease, stage 2 (mild): Secondary | ICD-10-CM | POA: Diagnosis not present

## 2015-07-28 DIAGNOSIS — I10 Essential (primary) hypertension: Secondary | ICD-10-CM | POA: Diagnosis not present

## 2015-08-03 ENCOUNTER — Encounter: Payer: Medicare Other | Attending: Internal Medicine | Admitting: Nutrition

## 2015-08-03 DIAGNOSIS — Z794 Long term (current) use of insulin: Principal | ICD-10-CM

## 2015-08-03 DIAGNOSIS — E118 Type 2 diabetes mellitus with unspecified complications: Secondary | ICD-10-CM

## 2015-08-03 NOTE — Progress Notes (Signed)
Patient is considering an insulin pump.  He and his wife wanted information on how they work and what they cost.  We discussed the advantages and disadvantages of insulin pump therapy, and he was shown each model.  We discussed the advantages of each model and he was given brochures on each one.  They will look them over and call me if questions.   He was encouraged to go to their web sites and review each model and how it works.  He said that he does not use the internet, but his wife will do this.   He is not counting carbs, but both he and his wife knows exchanges.    We discussed pump therapy, and how it works--basal and bolus insulin delivery, and how it is different from the V-go that he was on, until his insurance would not longer cover it.   He reported good understanding of this and had no final questions. He said that he wanted the easiest pump, and I told him that the [facility] pump was the easiest in my oppinion, but to look them all over and call if questions.  He agreed to do this.

## 2015-08-10 NOTE — Patient Instructions (Signed)
Review the brochures given, and have wife go on line to see how the pumps work, and call if questions.  Once you decide on a model, fill out the enclosed paperwork and fax to the the company.

## 2015-08-16 ENCOUNTER — Other Ambulatory Visit: Payer: Self-pay | Admitting: Physician Assistant

## 2015-08-18 DIAGNOSIS — M7701 Medial epicondylitis, right elbow: Secondary | ICD-10-CM | POA: Diagnosis not present

## 2015-08-19 DIAGNOSIS — Z794 Long term (current) use of insulin: Secondary | ICD-10-CM | POA: Diagnosis not present

## 2015-08-19 DIAGNOSIS — E1122 Type 2 diabetes mellitus with diabetic chronic kidney disease: Secondary | ICD-10-CM | POA: Diagnosis not present

## 2015-08-20 ENCOUNTER — Encounter (HOSPITAL_COMMUNITY)
Admission: RE | Admit: 2015-08-20 | Discharge: 2015-08-20 | Disposition: A | Discharge status: Home / Self Care | Payer: Medicare Other | Source: Ambulatory Visit | Attending: Orthopaedic Surgery | Admitting: Orthopaedic Surgery

## 2015-08-20 ENCOUNTER — Encounter (HOSPITAL_COMMUNITY): Payer: Self-pay

## 2015-08-20 DIAGNOSIS — M1711 Unilateral primary osteoarthritis, right knee: Secondary | ICD-10-CM | POA: Diagnosis not present

## 2015-08-20 DIAGNOSIS — Z01812 Encounter for preprocedural laboratory examination: Secondary | ICD-10-CM | POA: Insufficient documentation

## 2015-08-20 DIAGNOSIS — Z0183 Encounter for blood typing: Secondary | ICD-10-CM | POA: Insufficient documentation

## 2015-08-20 HISTORY — DX: Essential (primary) hypertension: I10

## 2015-08-20 HISTORY — DX: Presence of spectacles and contact lenses: Z97.3

## 2015-08-20 HISTORY — DX: Unspecified hearing loss, unspecified ear: H91.90

## 2015-08-20 HISTORY — DX: Personal history of urinary calculi: Z87.442

## 2015-08-20 LAB — CBC
HCT: 39.5 % (ref 39.0–52.0)
HEMOGLOBIN: 14.1 g/dL (ref 13.0–17.0)
MCH: 30.9 pg (ref 26.0–34.0)
MCHC: 35.7 g/dL (ref 30.0–36.0)
MCV: 86.6 fL (ref 78.0–100.0)
PLATELETS: 264 10*3/uL (ref 150–400)
RBC: 4.56 MIL/uL (ref 4.22–5.81)
RDW: 13 % (ref 11.5–15.5)
WBC: 6.1 10*3/uL (ref 4.0–10.5)

## 2015-08-20 LAB — BASIC METABOLIC PANEL
ANION GAP: 9 (ref 5–15)
BUN: 23 mg/dL — ABNORMAL HIGH (ref 6–20)
CHLORIDE: 101 mmol/L (ref 101–111)
CO2: 25 mmol/L (ref 22–32)
CREATININE: 1.16 mg/dL (ref 0.61–1.24)
Calcium: 9 mg/dL (ref 8.9–10.3)
GFR calc non Af Amer: >60 mL/min (ref >60)
Glucose, Bld: 344 mg/dL — ABNORMAL HIGH (ref 65–99)
POTASSIUM: 4 mmol/L (ref 3.5–5.1)
SODIUM: 135 mmol/L (ref 135–145)

## 2015-08-20 LAB — SURGICAL PCR SCREEN
MRSA, PCR: NEGATIVE
Staphylococcus aureus: NEGATIVE

## 2015-08-20 LAB — PROTIME-INR
INR: 1.06 (ref 0.00–1.49)
Prothrombin Time: 13.6 seconds (ref 11.6–15.2)

## 2015-08-20 NOTE — Progress Notes (Signed)
BMP results in epic per PAT visit 08/20/2015 sent to Dr Kathrynn Speed

## 2015-08-20 NOTE — Progress Notes (Signed)
EKG/epic 05/11/2015 Stress test results/epic 03/19/2014 ECHO/epic 04/10/2012 LOV note cardiology/Dr Crenshaw epic 05/11/2015 H&P note per chart per [facility] 07/13/2015  A1C results per chart 07/28/2015 faxed results to Dr Kathrynn Speed

## 2015-08-20 NOTE — Patient Instructions (Signed)
Joel Boston Churchwell Sr.  08/20/2015   Your procedure is scheduled on: Friday August 27, 2015  Report to [facility] Main  Entrance take Joel Joel Jones  elevators to 3rd floor to  Joel Joel Jones at 5:30 AM.  Call this number if you have problems the morning of surgery [phone]   Remember: ONLY 1 PERSON Sorto GO WITH YOU TO SHORT STAY TO GET  READY MORNING OF Joel [name]  Do not eat food or drink liquids :After Midnight.     Take these medicines the morning of surgery with A SIP OF WATER: Joel Joel Jones (Joel Joel Jones); Joel Joel Jones use Flonase if needed (bring with you day of surgery); Joel Joel Jones (Joel Joel Jones)               TAKE 1/2 DOSE OF EVENING INSUILIN NIGHT PRIOR TO SURGERY  DO NOT TAKE ANY DIABETIC MEDICATIONS DAY OF YOUR SURGERY                               You Joel Joel Jones not have any metal on your body including hair pins and              piercings  Do not wear jewelry,  lotions, powders or colognes, deodorant                         Men Sem shave face and neck.   Do not bring valuables to the hospital. Joel Joel [name]  Contacts, dentures or bridgework Joel Joel Jones not be worn into surgery.  Leave suitcase in the car. After surgery it [name] brought to your room.                Please read over the following fact sheets you were given:[facility] INFORMATION SHEET; INCENTIVE SPIROMETER  _____________________________________________________________________             [facility] - Preparing for Surgery Before surgery, you can play an important role.  Because skin is not sterile, your skin needs to be as free of germs as possible.  You can reduce the number of germs on your skin by washing with CHG (chlorahexidine gluconate) soap before surgery.  CHG is an antiseptic cleaner which kills germs and bonds with the skin to continue killing germs even after washing. Please DO NOT use if you have an allergy to CHG or antibacterial soaps.  If your skin becomes  reddened/irritated stop using the CHG and inform your nurse when you arrive at Short Stay. Do not shave (including legs and underarms) for at least 48 hours prior to the first CHG shower.  You Joel Joel Jones shave your face/neck. Please follow these instructions carefully:  1.  Shower with CHG Soap the night before surgery and the  morning of Surgery.  2.  If you choose to wash your hair, wash your hair first as usual with your  normal  shampoo.  3.  After you shampoo, rinse your hair and body thoroughly to remove the  shampoo.                           4.  Use CHG as you would any other liquid soap.  You can apply chg directly  to the  skin and wash                       Gently with a scrungie or clean washcloth.  5.  Apply the CHG Soap to your body ONLY FROM THE NECK DOWN.   Do not use on face/ open                           Wound or open sores. Avoid contact with eyes, ears mouth and genitals (private parts).                       Wash face,  Genitals (private parts) with your normal soap.             6.  Wash thoroughly, paying special attention to the area where your surgery  will be performed.  7.  Thoroughly rinse your body with warm water from the neck down.  8.  DO NOT shower/wash with your normal soap after using and rinsing off  the CHG Soap.                9.  Pat yourself dry with a clean towel.            10.  Wear clean pajamas.            11.  Place clean sheets on your bed the night of your first shower and do not  sleep with pets. Day of Surgery : Do not apply any lotions/deodorants the morning of surgery.  Please wear clean clothes to the hospital/surgery center.  FAILURE TO FOLLOW THESE INSTRUCTIONS Joel Joel Jones RESULT IN THE CANCELLATION OF YOUR SURGERY PATIENT SIGNATURE_________________________________  NURSE SIGNATURE__________________________________  ________________________________________________________________________   Joel Joel Jones  An incentive spirometer is a tool that  can help keep your lungs clear and active. This tool measures how well you are filling your lungs with each breath. Taking long deep breaths Joel Joel Jones help reverse or decrease the chance of developing breathing (pulmonary) problems (especially infection) following:  A long period of time when you are unable to move or be active. BEFORE THE PROCEDURE   If the spirometer includes an indicator to show your best effort, your nurse or respiratory therapist will set it to a desired goal.  If possible, sit up straight or lean slightly forward. Try not to slouch.  Hold the incentive spirometer in an upright position. INSTRUCTIONS FOR USE  1. Sit on the edge of your bed if possible, or sit up as far as you can in bed or on a chair. 2. Hold the incentive spirometer in an upright position. 3. Breathe out normally. 4. Place the mouthpiece in your mouth and seal your lips tightly around it. 5. Breathe in slowly and as deeply as possible, raising the piston or the ball toward the top of the column. 6. Hold your breath for 3-5 seconds or for as long as possible. Allow the piston or ball to fall to the bottom of the column. 7. Remove the mouthpiece from your mouth and breathe out normally. 8. Rest for a few seconds and repeat Steps 1 through 7 at least 10 times every 1-2 hours when you are awake. Take your time and take a few normal breaths between deep breaths. 9. The spirometer Joel Joel Jones include an indicator to show your best effort. Use the indicator as a goal to work toward during each repetition. 10. After each set of  10 deep breaths, practice coughing to be sure your lungs are clear. If you have an incision (the cut made at the time of surgery), support your incision when coughing by placing a pillow or rolled up towels firmly against it. Once you are able to get out of bed, walk around indoors and cough well. You Joel Joel Jones stop using the incentive spirometer when instructed by your caregiver.  RISKS AND  COMPLICATIONS  Take your time so you do not get dizzy or light-headed.  If you are in pain, you Joel Joel Jones need to take or ask for pain medication before doing incentive spirometry. It is harder to take a deep breath if you are having pain. AFTER USE  Rest and breathe slowly and easily.  It can be helpful to keep track of a log of your progress. Your caregiver can provide you with a simple table to help with this. If you are using the spirometer at home, follow these instructions: [place] IF:   You are having difficultly using the spirometer.  You have trouble using the spirometer as often as instructed.  Your pain medication is not giving enough relief while using the spirometer.  You develop fever of 100.5 F (38.1 C) or higher. SEEK IMMEDIATE MEDICAL CARE IF:   You cough up bloody sputum that had not been present before.  You develop fever of 102 F (38.9 C) or greater.  You develop worsening pain at or near the incision site. MAKE SURE YOU:   Understand these instructions.  Will watch your condition.  Will get help right away if you are not doing well or get worse. Document Released: 09/11/2006 Document Revised: 07/24/2011 Document Reviewed: 11/12/2006 [facility] Patient Information 2014 [place], [place].   ________________________________________________________________________

## 2015-08-26 NOTE — Anesthesia Preprocedure Evaluation (Addendum)
Anesthesia Evaluation   Patient awake and Patient confused    Reviewed: Allergy & Precautions, NPO status , Patient's Chart, lab work & pertinent test results  Airway Mallampati: III   Neck ROM: Full    Dental  (+) Edentulous Upper, Edentulous Lower   Pulmonary former smoker,    breath sounds clear to auscultation       Cardiovascular hypertension, Pt. on medications  Rhythm:Regular  Stress ECHO 2013 Normal   Neuro/Psych    GI/Hepatic GERD  Medicated,  Endo/Other  diabetes, Type 2, Insulin Dependent  Renal/GU      Musculoskeletal   Abdominal   Peds  Hematology   Anesthesia Other Findings   Reproductive/Obstetrics                           Anesthesia Physical Anesthesia Plan  ASA: III  Anesthesia Plan: Spinal   Post-op Pain Management:    Induction:   Airway Management Planned: Nasal Cannula  Additional Equipment:   Intra-op Plan:   Post-operative Plan:   Informed Consent: I have reviewed the patients History and Physical, chart, labs and discussed the procedure including the risks, benefits and alternatives for the proposed anesthesia with the patient or authorized representative who has indicated his/her understanding and acceptance.     Plan Discussed with:   Anesthesia Plan Comments: (Plan on spinal, GA if she refuses)        Anesthesia Quick Evaluation

## 2015-08-27 ENCOUNTER — Encounter (HOSPITAL_COMMUNITY): Payer: Self-pay

## 2015-08-27 ENCOUNTER — Encounter (HOSPITAL_COMMUNITY)
Admission: RE | Disposition: A | Discharge status: Home / Self Care | Payer: Self-pay | Source: Ambulatory Visit | Attending: Orthopaedic Surgery

## 2015-08-27 ENCOUNTER — Ambulatory Visit (HOSPITAL_COMMUNITY): Payer: Medicare Other | Admitting: Anesthesiology

## 2015-08-27 ENCOUNTER — Observation Stay (HOSPITAL_COMMUNITY): Payer: Medicare Other

## 2015-08-27 ENCOUNTER — Ambulatory Visit (HOSPITAL_COMMUNITY)
Admission: RE | Admit: 2015-08-27 | Discharge: 2015-08-28 | Disposition: A | Discharge status: Home / Self Care | Payer: Medicare Other | Source: Ambulatory Visit | Attending: Orthopaedic Surgery | Admitting: Orthopaedic Surgery

## 2015-08-27 DIAGNOSIS — I10 Essential (primary) hypertension: Secondary | ICD-10-CM | POA: Diagnosis not present

## 2015-08-27 DIAGNOSIS — M25761 Osteophyte, right knee: Secondary | ICD-10-CM | POA: Insufficient documentation

## 2015-08-27 DIAGNOSIS — M25561 Pain in right knee: Secondary | ICD-10-CM | POA: Diagnosis present

## 2015-08-27 DIAGNOSIS — Z7951 Long term (current) use of inhaled steroids: Secondary | ICD-10-CM | POA: Insufficient documentation

## 2015-08-27 DIAGNOSIS — Z79899 Other long term (current) drug therapy: Secondary | ICD-10-CM | POA: Insufficient documentation

## 2015-08-27 DIAGNOSIS — M1711 Unilateral primary osteoarthritis, right knee: Secondary | ICD-10-CM | POA: Insufficient documentation

## 2015-08-27 DIAGNOSIS — Z87891 Personal history of nicotine dependence: Secondary | ICD-10-CM | POA: Insufficient documentation

## 2015-08-27 DIAGNOSIS — E119 Type 2 diabetes mellitus without complications: Secondary | ICD-10-CM | POA: Insufficient documentation

## 2015-08-27 DIAGNOSIS — Z7982 Long term (current) use of aspirin: Secondary | ICD-10-CM | POA: Insufficient documentation

## 2015-08-27 DIAGNOSIS — E785 Hyperlipidemia, unspecified: Secondary | ICD-10-CM | POA: Diagnosis not present

## 2015-08-27 DIAGNOSIS — Z96651 Presence of right artificial knee joint: Secondary | ICD-10-CM

## 2015-08-27 DIAGNOSIS — K219 Gastro-esophageal reflux disease without esophagitis: Secondary | ICD-10-CM | POA: Insufficient documentation

## 2015-08-27 DIAGNOSIS — M179 Osteoarthritis of knee, unspecified: Secondary | ICD-10-CM | POA: Diagnosis not present

## 2015-08-27 DIAGNOSIS — Z96652 Presence of left artificial knee joint: Secondary | ICD-10-CM | POA: Diagnosis not present

## 2015-08-27 DIAGNOSIS — I2510 Atherosclerotic heart disease of native coronary artery without angina pectoris: Secondary | ICD-10-CM | POA: Insufficient documentation

## 2015-08-27 DIAGNOSIS — Z794 Long term (current) use of insulin: Secondary | ICD-10-CM | POA: Insufficient documentation

## 2015-08-27 DIAGNOSIS — Z471 Aftercare following joint replacement surgery: Secondary | ICD-10-CM | POA: Diagnosis not present

## 2015-08-27 HISTORY — PX: PARTIAL KNEE ARTHROPLASTY: SHX2174

## 2015-08-27 HISTORY — DX: Bradycardia, unspecified: R00.1

## 2015-08-27 LAB — GLUCOSE, CAPILLARY
GLUCOSE-CAPILLARY: 214 mg/dL — AB (ref 65–99)
GLUCOSE-CAPILLARY: 188 mg/dL — AB (ref 65–99)
GLUCOSE-CAPILLARY: 258 mg/dL — AB (ref 65–99)
GLUCOSE-CAPILLARY: 173 mg/dL — AB (ref 65–99)
Glucose-Capillary: 189 mg/dL — ABNORMAL HIGH (ref 65–99)
Glucose-Capillary: 234 mg/dL — ABNORMAL HIGH (ref 65–99)
Glucose-Capillary: 261 mg/dL — ABNORMAL HIGH (ref 65–99)

## 2015-08-27 LAB — TYPE AND SCREEN
ABO/RH(D): O POS
ANTIBODY SCREEN: NEGATIVE

## 2015-08-27 SURGERY — ARTHROPLASTY, KNEE, UNICOMPARTMENTAL
Anesthesia: Spinal | Laterality: Right

## 2015-08-27 MED ORDER — KETOROLAC TROMETHAMINE 30 MG/ML IJ SOLN
30.0000 mg | Freq: Once | INTRAMUSCULAR | Status: AC
  Administered 2015-08-27: 30 mg via INTRAVENOUS
  Filled 2015-08-27: qty 1

## 2015-08-27 MED ORDER — ONDANSETRON HCL 4 MG/2ML IJ SOLN: 4.0000 mg | Freq: Four times a day (QID) | INTRAMUSCULAR | Status: DC | PRN

## 2015-08-27 MED ORDER — ONDANSETRON HCL 4 MG/2ML IJ SOLN
INTRAMUSCULAR | Status: AC
  Filled 2015-08-27: qty 2

## 2015-08-27 MED ORDER — MENTHOL 3 MG MT LOZG: 1.0000 | LOZENGE | OROMUCOSAL | Status: DC | PRN

## 2015-08-27 MED ORDER — OXYCODONE HCL 5 MG PO TABS
5.0000 mg | ORAL_TABLET | ORAL | Status: DC | PRN
  Administered 2015-08-27 – 2015-08-28 (×6): 10 mg via ORAL
  Filled 2015-08-27 (×6): qty 2

## 2015-08-27 MED ORDER — FENTANYL CITRATE (PF) 100 MCG/2ML IJ SOLN: 25.0000 ug | INTRAMUSCULAR | Status: DC | PRN

## 2015-08-27 MED ORDER — RAMIPRIL 5 MG PO CAPS
5.0000 mg | ORAL_CAPSULE | Freq: Every day | ORAL | Status: DC
  Administered 2015-08-27: 5 mg via ORAL
  Filled 2015-08-27 (×2): qty 1

## 2015-08-27 MED ORDER — CEFAZOLIN SODIUM-DEXTROSE 2-4 GM/100ML-% IV SOLN
2.0000 g | INTRAVENOUS | Status: AC
  Administered 2015-08-27: 2 g via INTRAVENOUS
  Filled 2015-08-27: qty 100

## 2015-08-27 MED ORDER — INSULIN ASPART 100 UNIT/ML ~~LOC~~ SOLN
0.0000 [IU] | Freq: Three times a day (TID) | SUBCUTANEOUS | Status: DC
  Administered 2015-08-27 (×2): 3 [IU] via SUBCUTANEOUS
  Administered 2015-08-28: 5 [IU] via SUBCUTANEOUS

## 2015-08-27 MED ORDER — METOCLOPRAMIDE HCL 10 MG PO TABS
5.0000 mg | ORAL_TABLET | Freq: Three times a day (TID) | ORAL | Status: DC | PRN
Start: 2015-08-27 — End: 2015-08-28

## 2015-08-27 MED ORDER — MIDAZOLAM HCL 5 MG/5ML IJ SOLN
INTRAMUSCULAR | Status: DC | PRN
  Administered 2015-08-27: 2 mg via INTRAVENOUS

## 2015-08-27 MED ORDER — ACETAMINOPHEN 650 MG RE SUPP: 650.0000 mg | Freq: Four times a day (QID) | RECTAL | Status: DC | PRN

## 2015-08-27 MED ORDER — ACETAMINOPHEN 325 MG PO TABS
650.0000 mg | ORAL_TABLET | Freq: Four times a day (QID) | ORAL | Status: DC | PRN
Start: 2015-08-27 — End: 2015-08-28
  Administered 2015-08-27 (×2): 650 mg via ORAL
  Filled 2015-08-27 (×2): qty 2

## 2015-08-27 MED ORDER — BUPIVACAINE IN DEXTROSE 0.75-8.25 % IT SOLN
INTRATHECAL | Status: DC | PRN
Start: 2015-08-27 — End: 2015-08-27

## 2015-08-27 MED ORDER — HYDROCHLOROTHIAZIDE 12.5 MG PO CAPS
12.5000 mg | ORAL_CAPSULE | Freq: Every day | ORAL | Status: DC
  Administered 2015-08-28: 12.5 mg via ORAL
  Filled 2015-08-27 (×2): qty 1

## 2015-08-27 MED ORDER — PROPOFOL 500 MG/50ML IV EMUL
INTRAVENOUS | Status: DC | PRN
Start: 2015-08-27 — End: 2015-08-27
  Administered 2015-08-27: 50 ug/kg/min via INTRAVENOUS

## 2015-08-27 MED ORDER — FENTANYL CITRATE (PF) 100 MCG/2ML IJ SOLN
INTRAMUSCULAR | Status: DC | PRN
  Administered 2015-08-27: 50 ug via INTRAVENOUS

## 2015-08-27 MED ORDER — PHENYLEPHRINE HCL 10 MG/ML IJ SOLN
INTRAMUSCULAR | Status: DC | PRN
Start: 2015-08-27 — End: 2015-08-27
  Administered 2015-08-27: 40 ug via INTRAVENOUS

## 2015-08-27 MED ORDER — HYDROMORPHONE HCL 1 MG/ML IJ SOLN
0.5000 mg | INTRAMUSCULAR | Status: DC | PRN
  Administered 2015-08-27 – 2015-08-28 (×2): 0.5 mg via INTRAVENOUS
  Filled 2015-08-27 (×2): qty 1

## 2015-08-27 MED ORDER — INSULIN ASPART 100 UNIT/ML ~~LOC~~ SOLN
SUBCUTANEOUS | Status: AC
  Filled 2015-08-27: qty 1

## 2015-08-27 MED ORDER — BUPIVACAINE IN DEXTROSE 0.75-8.25 % IT SOLN
INTRATHECAL | Status: DC | PRN
  Administered 2015-08-27: 2 mL via INTRATHECAL

## 2015-08-27 MED ORDER — CHLORHEXIDINE GLUCONATE 4 % EX LIQD: 60.0000 mL | Freq: Once | CUTANEOUS | Status: DC

## 2015-08-27 MED ORDER — KRILL OIL 1000 MG PO CAPS: 1000.0000 mg | ORAL_CAPSULE | Freq: Every day | ORAL | Status: DC

## 2015-08-27 MED ORDER — PANTOPRAZOLE SODIUM 40 MG PO TBEC
40.0000 mg | DELAYED_RELEASE_TABLET | Freq: Every day | ORAL | Status: DC
  Administered 2015-08-28: 40 mg via ORAL
  Filled 2015-08-27: qty 1

## 2015-08-27 MED ORDER — PROPOFOL 10 MG/ML IV BOLUS
INTRAVENOUS | Status: DC | PRN
  Administered 2015-08-27: 30 mg via INTRAVENOUS

## 2015-08-27 MED ORDER — DIPHENHYDRAMINE HCL 12.5 MG/5ML PO ELIX
12.5000 mg | ORAL_SOLUTION | ORAL | Status: DC | PRN
  Administered 2015-08-27 – 2015-08-28 (×2): 12.5 mg via ORAL
  Filled 2015-08-27 (×3): qty 5

## 2015-08-27 MED ORDER — OMEGA-3-ACID ETHYL ESTERS 1 G PO CAPS
1.0000 g | ORAL_CAPSULE | Freq: Every day | ORAL | Status: DC
  Administered 2015-08-28: 1 g via ORAL
  Filled 2015-08-27 (×2): qty 1

## 2015-08-27 MED ORDER — LACTATED RINGERS IV SOLN: INTRAVENOUS | Status: DC

## 2015-08-27 MED ORDER — PROPOFOL 10 MG/ML IV BOLUS
INTRAVENOUS | Status: AC
  Filled 2015-08-27: qty 60

## 2015-08-27 MED ORDER — AMLODIPINE BESYLATE 5 MG PO TABS
5.0000 mg | ORAL_TABLET | Freq: Every day | ORAL | Status: DC
Start: 2015-08-28 — End: 2015-08-28
  Administered 2015-08-28: 5 mg via ORAL
  Filled 2015-08-27: qty 1

## 2015-08-27 MED ORDER — EPHEDRINE SULFATE 50 MG/ML IJ SOLN
INTRAMUSCULAR | Status: AC
  Filled 2015-08-27: qty 1

## 2015-08-27 MED ORDER — VITAMIN E 180 MG (400 UNIT) PO CAPS
400.0000 [IU] | ORAL_CAPSULE | Freq: Every day | ORAL | Status: DC
  Administered 2015-08-28: 400 [IU] via ORAL
  Filled 2015-08-27 (×2): qty 1

## 2015-08-27 MED ORDER — INSULIN ASPART 100 UNIT/ML ~~LOC~~ SOLN
10.0000 [IU] | Freq: Three times a day (TID) | SUBCUTANEOUS | Status: DC
  Administered 2015-08-27 – 2015-08-28 (×3): 10 [IU] via SUBCUTANEOUS

## 2015-08-27 MED ORDER — CEFAZOLIN SODIUM 1-5 GM-% IV SOLN
1.0000 g | Freq: Four times a day (QID) | INTRAVENOUS | Status: AC
  Administered 2015-08-27 (×2): 1 g via INTRAVENOUS
  Filled 2015-08-27 (×2): qty 50

## 2015-08-27 MED ORDER — VITAMIN C 500 MG PO TABS
500.0000 mg | ORAL_TABLET | Freq: Every day | ORAL | Status: DC
  Administered 2015-08-28: 500 mg via ORAL
  Filled 2015-08-27 (×2): qty 1

## 2015-08-27 MED ORDER — MEPERIDINE HCL 50 MG/ML IJ SOLN: 6.2500 mg | INTRAMUSCULAR | Status: DC | PRN

## 2015-08-27 MED ORDER — CEFAZOLIN SODIUM-DEXTROSE 2-4 GM/100ML-% IV SOLN
INTRAVENOUS | Status: AC
  Filled 2015-08-27: qty 100

## 2015-08-27 MED ORDER — LACTATED RINGERS IV SOLN
INTRAVENOUS | Status: DC | PRN
  Administered 2015-08-27 (×2): via INTRAVENOUS

## 2015-08-27 MED ORDER — INSULIN GLARGINE 100 UNIT/ML ~~LOC~~ SOLN
16.0000 [IU] | Freq: Every morning | SUBCUTANEOUS | Status: DC
  Administered 2015-08-27 – 2015-08-28 (×2): 16 [IU] via SUBCUTANEOUS
  Filled 2015-08-27 (×2): qty 0.16

## 2015-08-27 MED ORDER — INSULIN ASPART 100 UNIT/ML ~~LOC~~ SOLN
0.0000 [IU] | Freq: Every day | SUBCUTANEOUS | Status: DC
  Administered 2015-08-27: 3 [IU] via SUBCUTANEOUS

## 2015-08-27 MED ORDER — PHENOL 1.4 % MT LIQD: 1.0000 | OROMUCOSAL | Status: DC | PRN

## 2015-08-27 MED ORDER — PRAVASTATIN SODIUM 40 MG PO TABS
40.0000 mg | ORAL_TABLET | Freq: Every evening | ORAL | Status: DC
Start: 2015-08-27 — End: 2015-08-28
  Administered 2015-08-27: 40 mg via ORAL
  Filled 2015-08-27 (×2): qty 1

## 2015-08-27 MED ORDER — METHOCARBAMOL 500 MG PO TABS
500.0000 mg | ORAL_TABLET | Freq: Four times a day (QID) | ORAL | Status: DC | PRN
  Administered 2015-08-27 – 2015-08-28 (×3): 500 mg via ORAL
  Filled 2015-08-27 (×3): qty 1

## 2015-08-27 MED ORDER — METOCLOPRAMIDE HCL 5 MG/ML IJ SOLN
5.0000 mg | Freq: Three times a day (TID) | INTRAMUSCULAR | Status: DC | PRN
Start: 2015-08-27 — End: 2015-08-28

## 2015-08-27 MED ORDER — HYDROCHLOROTHIAZIDE 25 MG PO TABS: 12.5000 mg | ORAL_TABLET | Freq: Every morning | ORAL | Status: DC

## 2015-08-27 MED ORDER — ONDANSETRON HCL 4 MG/2ML IJ SOLN
INTRAMUSCULAR | Status: DC | PRN
  Administered 2015-08-27: 4 mg via INTRAVENOUS

## 2015-08-27 MED ORDER — MIDAZOLAM HCL 2 MG/2ML IJ SOLN
INTRAMUSCULAR | Status: AC
  Filled 2015-08-27: qty 2

## 2015-08-27 MED ORDER — PHENYLEPHRINE 40 MCG/ML (10ML) SYRINGE FOR IV PUSH (FOR BLOOD PRESSURE SUPPORT)
PREFILLED_SYRINGE | INTRAVENOUS | Status: AC
  Filled 2015-08-27: qty 10

## 2015-08-27 MED ORDER — FLUTICASONE PROPIONATE 50 MCG/ACT NA SUSP
1.0000 | Freq: Every day | NASAL | Status: DC
  Administered 2015-08-28: 1 via NASAL
  Filled 2015-08-27: qty 16

## 2015-08-27 MED ORDER — PROPOFOL 10 MG/ML IV BOLUS
INTRAVENOUS | Status: AC
  Filled 2015-08-27: qty 20

## 2015-08-27 MED ORDER — ONDANSETRON HCL 4 MG/2ML IJ SOLN: 4.0000 mg | Freq: Once | INTRAMUSCULAR | Status: DC

## 2015-08-27 MED ORDER — SODIUM CHLORIDE 0.9 % IV SOLN
INTRAVENOUS | Status: DC
  Administered 2015-08-27: 1000 mL via INTRAVENOUS

## 2015-08-27 MED ORDER — ADULT MULTIVITAMIN W/MINERALS CH
1.0000 | ORAL_TABLET | Freq: Every day | ORAL | Status: DC
  Administered 2015-08-28: 1 via ORAL
  Filled 2015-08-27 (×2): qty 1

## 2015-08-27 MED ORDER — EPHEDRINE SULFATE 50 MG/ML IJ SOLN
INTRAMUSCULAR | Status: DC | PRN
Start: 2015-08-27 — End: 2015-08-27
  Administered 2015-08-27 (×2): 10 mg via INTRAVENOUS

## 2015-08-27 MED ORDER — FENTANYL CITRATE (PF) 100 MCG/2ML IJ SOLN
INTRAMUSCULAR | Status: AC
  Filled 2015-08-27: qty 2

## 2015-08-27 MED ORDER — KETOROLAC TROMETHAMINE 15 MG/ML IJ SOLN: 7.5000 mg | Freq: Four times a day (QID) | INTRAMUSCULAR | Status: DC

## 2015-08-27 MED ORDER — METHOCARBAMOL 1000 MG/10ML IJ SOLN
500.0000 mg | Freq: Four times a day (QID) | INTRAMUSCULAR | Status: DC | PRN
  Filled 2015-08-27: qty 5

## 2015-08-27 MED ORDER — ONDANSETRON HCL 4 MG PO TABS
4.0000 mg | ORAL_TABLET | Freq: Four times a day (QID) | ORAL | Status: DC | PRN
  Administered 2015-08-27: 4 mg via ORAL
  Filled 2015-08-27: qty 1

## 2015-08-27 MED ORDER — ASPIRIN EC 325 MG PO TBEC
325.0000 mg | DELAYED_RELEASE_TABLET | Freq: Two times a day (BID) | ORAL | Status: DC
  Administered 2015-08-27 – 2015-08-28 (×2): 325 mg via ORAL
  Filled 2015-08-27 (×4): qty 1

## 2015-08-27 MED ORDER — INSULIN ASPART 100 UNIT/ML ~~LOC~~ SOLN
6.0000 [IU] | Freq: Once | SUBCUTANEOUS | Status: AC
  Administered 2015-08-27: 6 [IU] via INTRAVENOUS

## 2015-08-27 SURGICAL SUPPLY — 47 items
APL SKNCLS STERI-STRIP NONHPOA (GAUZE/BANDAGES/DRESSINGS) ×1
BAG SPEC THK2 15X12 ZIP CLS (MISCELLANEOUS)
BAG ZIPLOCK 12X15 (MISCELLANEOUS) IMPLANT
BANDAGE ACE 6X5 VEL STRL LF (GAUZE/BANDAGES/DRESSINGS) ×3 IMPLANT
BANDAGE ELASTIC 6 VELCRO ST LF (GAUZE/BANDAGES/DRESSINGS) ×2 IMPLANT
BENZOIN TINCTURE PRP APPL 2/3 (GAUZE/BANDAGES/DRESSINGS) ×2 IMPLANT
BLADE SAW RECIPROCATING 77.5 (BLADE) ×3 IMPLANT
BLADE SAW SGTL 13.0X1.19X90.0M (BLADE) ×3 IMPLANT
BLADE SAW STABLE CUT 109 (BLADE) ×2 IMPLANT
BONE CEMENT PALACOSE (Orthopedic Implant) ×3 IMPLANT
BOWL SMART MIX CTS (DISPOSABLE) ×3 IMPLANT
CAPT KNEE PARTIAL 2 ×2 IMPLANT
CEMENT BONE PALACOSE (Orthopedic Implant) IMPLANT
CLOSURE WOUND 1/2 X4 (GAUZE/BANDAGES/DRESSINGS) ×1
CLOTH BEACON ORANGE TIMEOUT ST (SAFETY) ×3 IMPLANT
CUFF TOURN SGL QUICK 34 (TOURNIQUET CUFF) ×3
CUFF TRNQT CYL 34X4X40X1 (TOURNIQUET CUFF) ×1 IMPLANT
DRAPE U-SHAPE 47X51 STRL (DRAPES) ×3 IMPLANT
DRSG AQUACEL AG ADV 3.5X10 (GAUZE/BANDAGES/DRESSINGS) IMPLANT
DRSG PAD ABDOMINAL 8X10 ST (GAUZE/BANDAGES/DRESSINGS) ×6 IMPLANT
DURAPREP 26ML APPLICATOR (WOUND CARE) ×3 IMPLANT
ELECT REM PT RETURN 9FT ADLT (ELECTROSURGICAL) ×3
ELECTRODE REM PT RTRN 9FT ADLT (ELECTROSURGICAL) ×1 IMPLANT
GAUZE SPONGE 4X4 12PLY STRL (GAUZE/BANDAGES/DRESSINGS) ×3 IMPLANT
GAUZE XEROFORM 1X8 LF (GAUZE/BANDAGES/DRESSINGS) IMPLANT
GLOVE BIO SURGEON STRL SZ7.5 (GLOVE) ×3 IMPLANT
GLOVE BIOGEL PI IND STRL 8 (GLOVE) ×2 IMPLANT
GLOVE BIOGEL PI INDICATOR 8 (GLOVE) ×4
GLOVE ECLIPSE 8.0 STRL XLNG CF (GLOVE) ×3 IMPLANT
GOWN STRL REUS W/TWL XL LVL3 (GOWN DISPOSABLE) ×6 IMPLANT
HANDPIECE INTERPULSE COAX TIP (DISPOSABLE) ×3
LEGGING LITHOTOMY PAIR STRL (DRAPES) ×3 IMPLANT
MANIFOLD NEPTUNE II (INSTRUMENTS) ×3 IMPLANT
PACK TOTAL KNEE CUSTOM (KITS) ×3 IMPLANT
PAD ABD 8X10 STRL (GAUZE/BANDAGES/DRESSINGS) ×2 IMPLANT
PADDING CAST COTTON 6X4 STRL (CAST SUPPLIES) ×3 IMPLANT
SET HNDPC FAN SPRY TIP SCT (DISPOSABLE) ×1 IMPLANT
STAPLER VISISTAT 35W (STAPLE) IMPLANT
STRIP CLOSURE SKIN 1/2X4 (GAUZE/BANDAGES/DRESSINGS) ×1 IMPLANT
SUT MNCRL AB 4-0 PS2 18 (SUTURE) IMPLANT
SUT VIC AB 0 CT1 36 (SUTURE) IMPLANT
SUT VIC AB 1 CT1 36 (SUTURE) ×3 IMPLANT
SUT VIC AB 2-0 CT1 27 (SUTURE) ×6
SUT VIC AB 2-0 CT1 TAPERPNT 27 (SUTURE) ×2 IMPLANT
TRAY FOLEY W/METER SILVER 14FR (SET/KITS/TRAYS/PACK) ×1 IMPLANT
TRAY FOLEY W/METER SILVER 16FR (SET/KITS/TRAYS/PACK) ×3 IMPLANT
WRAP KNEE MAXI GEL POST OP (GAUZE/BANDAGES/DRESSINGS) ×3 IMPLANT

## 2015-08-27 NOTE — Progress Notes (Signed)
Nurse called OR, spoke to anesthesiologist in Dr. Trevor Mace OR room. Dr. Ninfa Linden is aware of pts BP being 114/50 with apical pulse of 48 and pt is at level 8 on 0-10 pain scale. Dr. Ninfa Linden requests nurse to give narcotic medication if VS are stable and pt can handle narcotics.

## 2015-08-27 NOTE — Progress Notes (Signed)
OR nurse returned call to floor nurse. Per Dr. Ninfa Linden, pt can have no strong pain medications or narcotics. Dr. Ninfa Linden gave verbal order for one time dose of Toradol 30mg  IV. Nurse will enter order.

## 2015-08-27 NOTE — Transfer of Care (Signed)
Immediate Anesthesia Transfer of Care Note  Patient: Joel Sisley Ciancio Sr.  Procedure(s) Performed: Procedure(s): RIGHT UNICOMPARTMENTAL KNEE ARTHROPLASTY (Right)  Patient Location: PACU  Anesthesia Type:Spinal  Level of Consciousness:  sedated, patient cooperative and responds to stimulation  Airway & Oxygen Therapy:Patient Spontanous Breathing and Patient connected to face mask oxgen  Post-op Assessment:  Report given to PACU RN and Post -op Vital signs reviewed and stable  Post vital signs:  Reviewed and stable  Last Vitals:  Filed Vitals:   08/27/15 0543  BP: 121/65  Pulse: 75  Temp: 36.4 C  Resp: 18    Complications: No apparent anesthesia complications, [id number] level on exam.

## 2015-08-27 NOTE — Brief Op Note (Signed)
08/27/2015  9:26 AM  PATIENT:  [name]  69 y.o. male  PRE-OPERATIVE DIAGNOSIS:  Right knee medial compartment osteoarthritis  POST-OPERATIVE DIAGNOSIS:  Right knee medial compartment osteoarthritis  PROCEDURE:  Procedure(s): RIGHT UNICOMPARTMENTAL KNEE ARTHROPLASTY (Right)  SURGEON:  Surgeon(s) and Role:    * Mcarthur Rossetti, MD - Primary  ANESTHESIA:   spinal  EBL:  Total I/O In: 1000 [I.V.:1000] Out: 200 [Urine:150; Blood:50]  COUNTS:  YES  TOURNIQUET:   Total Tourniquet Time Documented: Thigh (Right) - 73 minutes Total: Thigh (Right) - 73 minutes   DICTATION: .Other Dictation: Dictation Number [id number]  PLAN OF CARE: Admit to inpatient   PATIENT DISPOSITION:  PACU - hemodynamically stable.   Delay start of Pharmacological VTE agent (>24hrs) due to surgical blood loss or risk of bleeding: no

## 2015-08-27 NOTE — Anesthesia Postprocedure Evaluation (Signed)
Anesthesia Post Note  Patient: Joel Yeley Knippenberg Sr.  Procedure(s) Performed: Procedure(s) (LRB): RIGHT UNICOMPARTMENTAL KNEE ARTHROPLASTY (Right)  Patient location during evaluation: PACU Anesthesia Type: Spinal Level of consciousness: oriented and awake and alert Pain management: pain level controlled Vital Signs Assessment: post-procedure vital signs reviewed and stable Respiratory status: spontaneous breathing, respiratory function stable and patient connected to nasal cannula oxygen Cardiovascular status: blood pressure returned to baseline and stable Postop Assessment: no headache and no backache Anesthetic complications: no    Last Vitals:  Filed Vitals:   08/27/15 0930 08/27/15 0945  BP: 103/55 102/61  Pulse: 59 51  Temp:    Resp: 18 18    Last Pain:  Filed Vitals:   08/27/15 0948  PainSc: 0-No pain                 Alexis Frock

## 2015-08-27 NOTE — Progress Notes (Signed)
6th floor notified pt will be in room in 20 minutes

## 2015-08-27 NOTE — H&P (Signed)
TOTAL KNEE ADMISSION H&P  Patient is being admitted for right partial knee arthroplasty.  Subjective:  Chief Complaint:right knee pain.  HPI: Joel [name], 70 y.o. male, has a history of pain and functional disability in the right knee due to arthritis and has failed non-surgical conservative treatments for greater than 12 weeks to includeNSAID's and/or analgesics, corticosteriod injections, viscosupplementation injections, flexibility and strengthening excercises, use of assistive devices, weight reduction as appropriate and activity modification.  Onset of symptoms was gradual, starting 5 years ago with gradually worsening course since that time. The patient noted no past surgery on the right knee(s).  Patient currently rates pain in the right knee(s) at 9 out of 10 with activity. Patient has night pain, worsening of pain with activity and weight bearing, pain that interferes with activities of daily living, pain with passive range of motion, crepitus and joint swelling.  Patient has evidence of subchondral sclerosis, periarticular osteophytes and joint space narrowing by imaging studies. There is no active infection.  Patient Active Problem List   Diagnosis Date Noted  . Osteoarthritis of right knee 08/27/2015  . Essential hypertension 05/11/2015  . Pain in the chest   . Right groin pain 08/15/2012  . Chest pain 04/02/2012  . Cerebrovascular disease 04/02/2012  . CAD (coronary artery disease) 04/02/2012  . Diabetes mellitus without complication ([place])   . Dizziness   . Sinus bradycardia   . Hyperlipidemia   . Edema    Past Medical History  Diagnosis Date  . Hyperlipidemia   . Hiatal hernia   . GERD (gastroesophageal reflux disease)   . Pneumonia 1972 X 1  . Chronic bronchitis ([place])     "get it just about q yr" (03/17/2014)  . Type II diabetes mellitus ([place])   . Anemia   . History of blood transfusion 2008    "related to OR"  . Arthritis     "all over"  . Hypertension   .  Wears glasses   . Hard of hearing     hearing aides bilat  . History of kidney stones   . Daily headache     "here lately" (03/17/2014); relates to sinuses  . Bradycardia     Past Surgical History  Procedure Laterality Date  . Knee arthroscopy Bilateral 1980's  . Inguinal hernia repair Right ?2010  . Total knee arthroplasty Left 2003  . Revision total knee arthroplasty Left 2008  . Cystectomy Left 2009    "behind knee"  . Joint replacement    . Cyst removed       per left knee/posteriorly  . Broken finger       surgical repaired left hand 2nd finger     Prescriptions prior to admission  Medication Sig Dispense Refill Last Dose  . amLODipine (NORVASC) 5 MG tablet Take 5 mg by mouth daily.   08/27/2015 at 0430  . aspirin 81 MG tablet Take 81 mg by mouth daily.   08/22/2015  . fluticasone (FLONASE) 50 MCG/ACT nasal spray Place 1 spray into both nostrils daily.   Past Month at Unknown time  . hydrochlorothiazide (HYDRODIURIL) 12.5 MG tablet Take 1 tablet by mouth every morning.   08/26/2015 at am  . insulin aspart (NOVOLOG FLEXPEN) 100 UNIT/ML FlexPen Inject 10 Units into the skin 3 (three) times daily with meals.    08/26/2015 at 1600  . insulin glargine (LANTUS) 100 UNIT/ML injection Inject 16 Units into the skin every morning.    08/26/2015 at am  .  KRILL OIL 1000 MG CAPS Take 1,000 mg by mouth daily.   08/26/2015 at am  . Multiple Vitamin (MULTIVITAMIN WITH MINERALS) TABS Take 1 tablet by mouth daily.   08/26/2015 at am  . pantoprazole (PROTONIX) 40 MG tablet Take 1 tablet (40 mg total) by mouth daily. 30 tablet 0 08/27/2015 at 0430  . pravastatin (PRAVACHOL) 40 MG tablet TAKE 1 TABLET EVERY EVENING 90 tablet 2 08/26/2015 at pm  . ramipril (ALTACE) 5 MG capsule Take 5 mg by mouth daily.   08/26/2015 at pm  . vitamin C (ASCORBIC ACID) 500 MG tablet Take 500 mg by mouth daily.   08/26/2015 at am  . vitamin E 400 UNIT capsule Take 400 Units by mouth daily.   08/26/2015 at am   Allergies   Allergen Reactions  . Invokana [Canagliflozin] Other (See Comments)    Stomach upset   . Lipitor [Atorvastatin] Other (See Comments)    Muscle aches   . Losartan Nausea And Vomiting  . Vicodin [Hydrocodone-Acetaminophen] Itching  . Codeine Swelling  . Roxicodone [Oxycodone] Itching    Social History  Substance Use Topics  . Smoking status: Former Smoker -- 1.00 packs/day for 5 years    Types: Cigarettes, Cigars    Quit date: 05/15/1968  . Smokeless tobacco: Never Used  . Alcohol Use: No    Family History  Problem Relation Age of Onset  . Heart disease      No family history  . Cancer Father     prostate     Review of Systems  Musculoskeletal: Positive for joint pain.  All other systems reviewed and are negative.   Objective:  Physical Exam  Constitutional: He is oriented to person, place, and time. He appears well-developed and well-nourished.  HENT:  Head: Normocephalic and atraumatic.  Eyes: EOM are normal. Pupils are equal, round, and reactive to light.  Neck: Normal range of motion. Neck supple.  Cardiovascular: Normal rate and regular rhythm.   Respiratory: Effort normal and breath sounds normal.  GI: Soft. Bowel sounds are normal.  Musculoskeletal:       Right knee: He exhibits decreased range of motion, effusion and abnormal alignment. Tenderness found. Medial joint line tenderness noted.  Neurological: He is alert and oriented to person, place, and time.  Skin: Skin is warm and dry.  Psychiatric: He has a normal mood and affect.    Vital signs in last 24 hours: Temp:  [97.5 F (36.4 C)] 97.5 F (36.4 C) (04/14 0543) Pulse Rate:  [75] 75 (04/14 0543) Resp:  [18] 18 (04/14 0543) BP: (121)/(65) 121/65 mmHg (04/14 0543) SpO2:  [98 %] 98 % (04/14 0543) Weight:  [88.565 kg (195 lb 4 oz)] 88.565 kg (195 lb 4 oz) (04/14 0543)  Labs:   Estimated body mass index is 31.53 kg/(m^2) as calculated from the following:   Height as of this encounter: 5\' 6"   (1.676 m).   Weight as of this encounter: 88.565 kg (195 lb 4 oz).   Imaging Review Plain radiographs demonstrate severe degenerative joint disease of the right knee(s). The overall alignment ismild varus. The bone quality appears to be good for age and reported activity level.  Assessment/Plan:  End stage arthritis, right knee   The patient history, physical examination, clinical judgment of the provider and imaging studies are consistent with end stage degenerative joint disease of the right knee(s) and partial knee arthroplasty is deemed medically necessary. The treatment options including medical management, injection therapy arthroscopy and arthroplasty  were discussed at length. The risks and benefits of partial knee arthroplasty were presented and reviewed. The risks due to aseptic loosening, infection, stiffness, patella tracking problems, thromboembolic complications and other imponderables were discussed. The patient acknowledged the explanation, agreed to proceed with the plan and consent was signed. Patient is being admitted for inpatient treatment for surgery, pain control, PT, OT, prophylactic antibiotics, VTE prophylaxis, progressive ambulation and ADL's and discharge planning. The patient is planning to be discharged home with home health services

## 2015-08-27 NOTE — Progress Notes (Addendum)
[facility]    [facility] is providing the following services: Patient declined rw.  Has one at home.  If patient discharges after hours, please call [phone].   Linward Headland 08/27/2015, 12:33 PM

## 2015-08-27 NOTE — Anesthesia Procedure Notes (Signed)
Spinal Patient location during procedure: OR Start time: 08/27/2015 7:24 AM End time: 08/27/2015 7:41 AM Staffing Anesthesiologist: Alexis Frock Resident/CRNA: Anne Fu Performed by: resident/CRNA  Preanesthetic Checklist Completed: patient identified, site marked, surgical consent, pre-op evaluation, timeout performed, IV checked, risks and benefits discussed and monitors and equipment checked Spinal Block Patient position: sitting Prep: Betadine Patient monitoring: heart rate, continuous pulse ox, blood pressure and cardiac monitor Approach: midline Location: L3-4 Injection technique: single-shot Needle Needle type: Whitacre and Introducer  Needle gauge: 22 G Needle length: 9 cm Needle insertion depth: 7 cm Assessment Sensory level: T6 Additional Notes Negative paresthesia. Negative blood return. Positive free-flowing CSF. Expiration date of kit checked and confirmed. Patient tolerated procedure well, without complications.  [place] not successful peri median

## 2015-08-27 NOTE — Progress Notes (Signed)
Nurse called OR and spoke to OR nurse currently working with Dr. Ninfa Linden. Nurse explained patient is in pain, pt has received 0.5mg  of Dilaudid, pts blood pressure 135/68, pulse 40, pulse has been dropping into high 30s. OR nurse will return call to floor nurse after speaking to Dr. Ninfa Linden.

## 2015-08-28 DIAGNOSIS — I10 Essential (primary) hypertension: Secondary | ICD-10-CM | POA: Diagnosis not present

## 2015-08-28 DIAGNOSIS — M1711 Unilateral primary osteoarthritis, right knee: Secondary | ICD-10-CM | POA: Diagnosis not present

## 2015-08-28 DIAGNOSIS — M25761 Osteophyte, right knee: Secondary | ICD-10-CM | POA: Diagnosis not present

## 2015-08-28 DIAGNOSIS — Z96652 Presence of left artificial knee joint: Secondary | ICD-10-CM | POA: Diagnosis not present

## 2015-08-28 DIAGNOSIS — I2510 Atherosclerotic heart disease of native coronary artery without angina pectoris: Secondary | ICD-10-CM | POA: Diagnosis not present

## 2015-08-28 DIAGNOSIS — E119 Type 2 diabetes mellitus without complications: Secondary | ICD-10-CM | POA: Diagnosis not present

## 2015-08-28 LAB — GLUCOSE, CAPILLARY: Glucose-Capillary: 243 mg/dL — ABNORMAL HIGH (ref 65–99)

## 2015-08-28 MED ORDER — ASPIRIN 325 MG PO TBEC: 325.0000 mg | DELAYED_RELEASE_TABLET | Freq: Two times a day (BID) | ORAL | Status: DC

## 2015-08-28 MED ORDER — OXYCODONE HCL 5 MG PO TABS: 5.0000 mg | ORAL_TABLET | ORAL | Status: DC | PRN

## 2015-08-28 MED ORDER — TIZANIDINE HCL 4 MG PO TABS: 4.0000 mg | ORAL_TABLET | Freq: Four times a day (QID) | ORAL | Status: DC | PRN

## 2015-08-28 NOTE — Progress Notes (Signed)
OT Cancellation Note  Patient Details Name: Joel Posas Anguiano Sr. MRN: [id number] DOB: 10-17-1945   Cancelled Treatment:    Reason Eval/Treat Not Completed: OT screened, no needs identified, will sign off  Darlina Rumpf [place], OTR/L [id number]  08/28/2015, 10:20 AM

## 2015-08-28 NOTE — Care Management Note (Signed)
Case Management Note  Patient Details  Name: Joel Crompton Reiger Sr. MRN: [id number] Date of Birth: [birth year]  Subjective/Objective:        S/P right unicompartmental knee replacement            Action/Plan: Discharge Planning: AVS reviewed: NCM spoke to pt at bedside. Offered HH choice. States he had [place] in the past for previous surgeries. Contacted [facility] for [facility] for scheduled dc home today. Pt states he has RW and 3n1 at home. Wife and grandson at home to assist with his care.   PCP [name] MD Expected Discharge Date:  08/28/2015           Expected Discharge Plan:  [place]  In-House Referral:  NA  Discharge planning Services  CM Consult  Post Acute Care Choice:  Home Health Choice offered to:  Patient  DME Arranged:  N/A DME Agency:  NA  HH Arranged:  PT HH Agency:  [place]  Status of Service:  Completed, signed off  Medicare Important Message Given:    Date Medicare IM Given:    Medicare IM give by:    Date Additional Medicare IM Given:    Additional Medicare Important Message give by:     If discussed at [place] of Stay Meetings, dates discussed:    Additional Comments:  Erenest Rasher, RN 08/28/2015, 11:21 AM

## 2015-08-28 NOTE — Op Note (Signed)
NAMEMarland Kitchen  LADARRION, EADY NO.:  [id number]  MEDICAL RECORD NO.:  [id number]  LOCATION:  39                         FACILITY:  [facility]  PHYSICIAN:  Lind Guest. Ninfa Linden, M.D.DATE OF BIRTH:  1945/11/26  DATE OF PROCEDURE:  08/27/2015 DATE OF DISCHARGE:                              OPERATIVE REPORT   PREOPERATIVE DIAGNOSIS:  Right knee primary osteoarthritis mainly involving the medial compartment.  POSTOPERATIVE DIAGNOSIS:  Right knee primary osteoarthritis mainly involving the medial compartment.  PROCEDURE:  Right partial/unicompartmental knee replacement.  IMPLANTS:  Biomet [place] partial knee system with a size medium right femoral component, size D right medial tibial tray, rotating mobile- bearing, polyethylene-bearing size 5-mm thickness.  SURGEON:  Lind Guest. Ninfa Linden, M.D.  ANESTHESIA:  Spinal.  ANTIBIOTICS:  2 g of IV Ancef.  TOURNIQUET TIME:  An hour and 15 minutes.  BLOOD LOSS:  Less than 100 mL.  COMPLICATIONS:  None.  INDICATIONS:  Mr. Bartle is a 70 year old gentleman, well known to me.  He has had previous left total knee arthroplasty and then eventually revision of that.  His left knee shows radiographic evidence of complete loss of the medial joint space.  His pain is only medial, although he does have some periarticular osteophytes and patellofemoral arthritic changes that does not bother him.  He has tried and failed all forms of conservative treatment and he would rather proceed with a partial knee arthroplasty than the total knee.  He has a varus deformity that is easily correctable with range of motion of his knee is full and his ACL is intact with normal Lachman's.  The risks and benefits of the surgery were explained to him in detail including the risk of acute blood loss anemia, nerve and vessel injury, fracture, infection, and DVT, mobile- bearing spin out and need for revision down the road.  He understands the goals are  decreased pain, improved mobility, and overall improved quality of life.  PROCEDURE DESCRIPTION:  After informed consent was obtained, appropriate right knee was marked.  He was brought to the operating room.  Spinal anesthesia was obtained while he was on the OR table, sitting up and he was laid in supine position.  A Foley catheter was placed and then a nonsterile tourniquet was placed around his upper right thigh and his right leg was prepped and draped with DuraPrep and sterile drapes.  His left leg was placed in appropriate well leg holder.  With the bed raised and the end of the bed dropped down, a time-out was called and he was identified as correct patient and correct right knee.  We then used an Esmarch to wrap out the leg and tourniquet was inflated to 300 mm of pressure.  I then made a medial incision just to the medial border of the patella, carried this down the patellar tendon.  We dissected down the knee joint.  We performed a medial parapatellar arthrotomy finding a large joint effusion.  We found significant cartilage changes in the medial compartment with bone-on-bone wear, but no lateral changes and the patellofemoral joint had some moderate changes.  With the knee in a flexed position using the sizing spoon,  we chose a size medium right femur.  Based off the sizing spoon, then we put our extramedullary guide for cutting the tibia, lining up with the tibial crest correcting for neutral slope and correcting for varus and valgus.  We then made our tibial cut and removed the tibial wafer/biscuit.  We then sized for a size D right tibia.  We then went to the femur and made an intramedullary pass just lateral to the insertion of the PCL and placed an intramedullary guide.  Then, we drew a line down the middle of the articular surface of the femoral condyle and put our medium size 4 cutting guide there.  We then made our drill holes and then we started with the milling  process of using a 0 spigot first.  We were then able to place the D trial tibia followed by the medium femur and then we trialed a 4-mm insert in that, we were pleased with our balancing there. We then went to the femur and we trialed a 1 and so we then chose a size 3 spigot.  We then also mill to 4 spigot just get our balancing, and flexion and extension gaps equal.  We then did our finishing milling on the femur, cut our posterior osteophytes and we did do our posterior femoral cut as well.  We then went back to the tibia and made our keel cut on the tibia.  With all trial components in place, we trialed a 4-mm polyethylene mobile-bearing trial insert followed by 5 and at the 5, gave him full extension and I did like the rotation of the platform and the range of motion of the knee and stability.  We then removed all trial components.  We irrigated the knee with normal saline solution using pulsatile lavage.  We mixed our cement and cemented the tibial tray followed by the femur.  We removed the cement debris from the knee and then pressurized the cement using a temporary size 5 spacer.  Once that had hardened, we removed all cement debris from the knee and then placed our real 5-mm thickness polyethylene insert for right knee.  We then irrigated the soft tissue with normal saline solution.  We closed the joint capsule with interrupted #1 Ethibond suture followed by 0 Vicryl in the deep tissue, 2-0 Vicryl in the subcutaneous tissue, 4-0 Monocryl subcuticular stitch, and Steri-Strips on the skin.  Well-padded thick sterile dressing was applied.  He was taken off the operating table and taken to the recovery room in stable condition.  All final counts were correct.  There were no complications noted.     Lind Guest. Ninfa Linden, M.D.     CYB/MEDQ  D:  08/27/2015  T:  08/28/2015  Job:  [id number]

## 2015-08-28 NOTE — Evaluation (Signed)
Physical Therapy Evaluation Patient Details Name: Joel Moyo Rusconi Sr. MRN: [id number] DOB: [birth year] Today's Date: 08/28/2015   History of Present Illness  R UKR; hx of L TKR and revision  Clinical Impression  Pt s/p R UKR presents with decreased R LE strength/ROM and post op pain limiting functional mobility.  Pt performed therex this am and is mobilizing at min guard/sup level.  Pt plans dc home this date with family assist and HHPT follow up.    Follow Up Recommendations Home health PT    Equipment Recommendations  None recommended by PT    Recommendations for Other Services OT consult     Precautions / Restrictions Precautions Precautions: Knee;Fall Restrictions Weight Bearing Restrictions: No Other Position/Activity Restrictions: WBAT      Mobility  Bed Mobility Overal bed mobility: Needs Assistance Bed Mobility: Supine to Sit     Supine to sit: Supervision     General bed mobility comments: Pt self assisting R LE with UEs  Transfers Overall transfer level: Needs assistance Equipment used: Rolling walker (2 wheeled) Transfers: Sit to/from Stand Sit to Stand: Min guard         General transfer comment: cues for LE management and use of UEs to self assist  Ambulation/Gait Ambulation/Gait assistance: Min guard;Supervision Ambulation Distance (Feet): 150 Feet Assistive device: Rolling walker (2 wheeled) Gait Pattern/deviations: Step-to pattern;Step-through pattern;Decreased step length - right;Decreased step length - left;Shuffle;Trunk flexed     General Gait Details: cues for initial sequence, posture and position from RW  Stairs Stairs: Yes Stairs assistance: Min assist Stair Management: No rails;Step to pattern;Backwards;With walker Number of Stairs: 1 General stair comments: single step 4x bkwd with cues for sequence and foot/RW placment  Wheelchair Mobility    Modified Rankin (Stroke Patients Only)       Balance                                              Pertinent Vitals/Pain Pain Assessment: 0-10 Pain Score: 6  Pain Location: R knee Pain Descriptors / Indicators: Aching;Sore Pain Intervention(s): Limited activity within patient's tolerance;Monitored during session;Premedicated before session;Ice applied    Home Living Family/patient expects to be discharged to:: Private residence Living Arrangements: Spouse/significant other Available Help at Discharge: Family Type of Home: House Home Access: Stairs to enter   Technical brewer of Steps: 1 Home Layout: One level Home Equipment: Environmental consultant - 2 wheels;Cane - single point;Bedside commode      Prior Function Level of Independence: Independent;Independent with assistive device(s)               Hand Dominance        Extremity/Trunk Assessment   Upper Extremity Assessment: Overall WFL for tasks assessed           Lower Extremity Assessment: RLE deficits/detail RLE Deficits / Details: 3-/5 quads with AAROM at knee -10 - 70    Cervical / Trunk Assessment: Normal  Communication   Communication: No difficulties  Cognition Arousal/Alertness: Awake/alert Behavior During Therapy: WFL for tasks assessed/performed Overall Cognitive Status: Within Functional Limits for tasks assessed                      General Comments      Exercises Total Joint Exercises Ankle Circles/Pumps: AROM;Supine;10 reps;Both Quad Sets: AROM;Both;10 reps;Supine Heel Slides: AAROM;Right;15 reps;Supine Straight Leg Raises: AAROM;Right;10 reps;Supine  Assessment/Plan    PT Assessment Patient needs continued PT services  PT Diagnosis Difficulty walking   PT Problem List Decreased strength;Decreased range of motion;Decreased activity tolerance;Decreased mobility;Decreased knowledge of use of DME;Pain  PT Treatment Interventions DME instruction;Gait training;Stair training;Functional mobility training;Therapeutic activities;Therapeutic  exercise;Patient/family education   PT Goals (Current goals can be found in the Care Plan section) Acute Rehab PT Goals Patient Stated Goal: Regain IND and walk without pain PT Goal Formulation: With patient Time For Goal Achievement: 08/28/15 Potential to Achieve Goals: Good    Frequency Min 1X/week   Barriers to discharge        Co-evaluation               End of Session Equipment Utilized During Treatment: Gait belt Activity Tolerance: Patient tolerated treatment well Patient left: in bed;with call bell/phone within reach;with family/visitor present Nurse Communication: Mobility status    Functional Assessment Tool Used: clinical judgement Functional Limitation: Mobility: Walking and moving around Mobility: Walking and Moving Around Current Status [id number]): At least 1 percent but less than 20 percent impaired, limited or restricted Mobility: Walking and Moving Around Goal Status [phone]): At least 1 percent but less than 20 percent impaired, limited or restricted Mobility: Walking and Moving Around Discharge Status [phone]): At least 1 percent but less than 20 percent impaired, limited or restricted    Time: 1002-1037 PT Time Calculation (min) (ACUTE ONLY): 35 min   Charges:   PT Evaluation $PT Eval Low Complexity: 1 Procedure PT Treatments $Gait Training: 8-22 mins   PT G Codes:   PT G-Codes **NOT FOR INPATIENT CLASS** Functional Assessment Tool Used: clinical judgement Functional Limitation: Mobility: Walking and moving around Mobility: Walking and Moving Around Current Status [id number]): At least 1 percent but less than 20 percent impaired, limited or restricted Mobility: Walking and Moving Around Goal Status [id number]): At least 1 percent but less than 20 percent impaired, limited or restricted Mobility: Walking and Moving Around Discharge Status [phone]): At least 1 percent but less than 20 percent impaired, limited or restricted    Hawthorne Day 08/28/2015, 1:13  PM

## 2015-08-28 NOTE — Progress Notes (Signed)
Subjective: 1 Day Post-Op Procedure(s) (LRB): RIGHT UNICOMPARTMENTAL KNEE ARTHROPLASTY (Right) Patient reports pain as moderate.    Objective: Vital signs in last 24 hours: Temp:  [97.3 F (36.3 C)-98.3 F (36.8 C)] 97.7 F (36.5 C) (04/15 0616) Pulse Rate:  [40-56] 56 (04/15 0616) Resp:  [12-18] 18 (04/15 0616) BP: (105-144)/(50-73) 131/63 mmHg (04/15 0923) SpO2:  [95 %-98 %] 96 % (04/15 0616)  Intake/Output from previous day: 04/14 0701 - 04/15 0700 In: 4722.5 [P.O.:1080; I.V.:3592.5; IV Piggyback:50] Out: 1600 [Urine:1550; Blood:50] Intake/Output this shift: Total I/O In: 120 [P.O.:120] Out: -   No results for input(s): HGB in the last 72 hours. No results for input(s): WBC, RBC, HCT, PLT in the last 72 hours. No results for input(s): NA, K, CL, CO2, BUN, CREATININE, GLUCOSE, CALCIUM in the last 72 hours. No results for input(s): LABPT, INR in the last 72 hours.  Sensation intact distally Intact pulses distally Dorsiflexion/Plantar flexion intact Incision: no drainage No cellulitis present Compartment soft  Assessment/Plan: 1 Day Post-Op Procedure(s) (LRB): RIGHT UNICOMPARTMENTAL KNEE ARTHROPLASTY (Right) Up with therapy Discharge home with home health today  Joel Jones 08/28/2015, 9:45 AM

## 2015-08-28 NOTE — Discharge Instructions (Signed)

## 2015-08-28 NOTE — Discharge Summary (Signed)
Patient ID: Joel Wollam Vallecillo Sr. MRN: [id number] DOB/AGE: [birth year] 69 y.o.  Admit date: 08/27/2015 Discharge date: 08/28/2015  Admission Diagnoses:  Principal Problem:   Osteoarthritis of right knee Active Problems:   S/P right unicompartmental knee replacement   Discharge Diagnoses:  Same  Past Medical History  Diagnosis Date  . Hyperlipidemia   . Hiatal hernia   . GERD (gastroesophageal reflux disease)   . Pneumonia 1972 X 1  . Chronic bronchitis ([place])     "get it just about q yr" (03/17/2014)  . Type II diabetes mellitus ([place])   . Anemia   . History of blood transfusion 2008    "related to OR"  . Arthritis     "all over"  . Hypertension   . Wears glasses   . Hard of hearing     hearing aides bilat  . History of kidney stones   . Daily headache     "here lately" (03/17/2014); relates to sinuses  . Bradycardia     Surgeries: Procedure(s): RIGHT UNICOMPARTMENTAL KNEE ARTHROPLASTY on 08/27/2015   Consultants:    Discharged Condition: Improved  Hospital Course: Carmine Polito Gibbons Sr. is an 70 y.o. male who was admitted 08/27/2015 for operative treatment ofOsteoarthritis of right knee. Patient has severe unremitting pain that affects sleep, daily activities, and work/hobbies. After pre-op clearance the patient was taken to the operating room on 08/27/2015 and underwent  Procedure(s): RIGHT UNICOMPARTMENTAL KNEE ARTHROPLASTY.    Patient was given perioperative antibiotics: Anti-infectives    Start     Dose/Rate Route Frequency Ordered Stop   08/27/15 1400  ceFAZolin (ANCEF) IVPB 1 g/50 mL premix     1 g 100 mL/hr over 30 Minutes Intravenous Every 6 hours 08/27/15 1035 08/27/15 2130   08/27/15 0538  ceFAZolin (ANCEF) IVPB 2g/100 mL premix     2 g 200 mL/hr over 30 Minutes Intravenous On call to O.R. 08/27/15 [id number] 08/27/15 0744       Patient was given sequential compression devices, early ambulation, and chemoprophylaxis to prevent DVT.  Patient benefited maximally from  hospital stay and there were no complications.    Recent vital signs: Patient Vitals for the past 24 hrs:  BP Temp Temp src Pulse Resp SpO2  08/28/15 0923 131/63 mmHg - - - - -  08/28/15 0616 (!) 144/60 mmHg 97.7 F (36.5 C) Oral (!) 56 18 96 %  08/28/15 0111 (!) 118/57 mmHg 98.3 F (36.8 C) Oral (!) 50 16 95 %  08/27/15 2121 (!) 116/55 mmHg 98 F (36.7 C) Oral (!) 55 15 95 %  08/27/15 1755 122/73 mmHg 97.3 F (36.3 C) Oral (!) 54 16 98 %  08/27/15 1538 (!) 126/55 mmHg - - (!) 51 - -  08/27/15 1530 - - - (!) 48 - -  08/27/15 1330 (!) 114/50 mmHg 97.8 F (36.6 C) - (!) 47 17 95 %  08/27/15 1228 135/68 mmHg 97.7 F (36.5 C) - (!) 40 16 97 %  08/27/15 1145 133/66 mmHg - - (!) 45 16 97 %  08/27/15 1025 (!) 111/51 mmHg 97.3 F (36.3 C) - (!) 48 14 95 %  08/27/15 1008 (!) 105/58 mmHg - - (!) 49 17 95 %  08/27/15 1000 (!) 109/55 mmHg 97.6 F (36.4 C) - (!) 50 12 98 %     Recent laboratory studies: No results for input(s): WBC, HGB, HCT, PLT, NA, K, CL, CO2, BUN, CREATININE, GLUCOSE, INR, CALCIUM in the last 72 hours.  Invalid input(s): PT,  2   Discharge Medications:     Medication List    STOP taking these medications        aspirin 81 MG tablet  Replaced by:  aspirin 325 MG EC tablet      TAKE these medications        amLODipine 5 MG tablet  Commonly known as:  NORVASC  Take 5 mg by mouth daily.     aspirin 325 MG EC tablet  Take 1 tablet (325 mg total) by mouth 2 (two) times daily after a meal.     fluticasone 50 MCG/ACT nasal spray  Commonly known as:  FLONASE  Place 1 spray into both nostrils daily.     hydrochlorothiazide 12.5 MG tablet  Commonly known as:  HYDRODIURIL  Take 1 tablet by mouth every morning.     insulin glargine 100 UNIT/ML injection  Commonly known as:  LANTUS  Inject 16 Units into the skin every morning.     Krill Oil 1000 MG Caps  Take 1,000 mg by mouth daily.     multivitamin with minerals Tabs tablet  Take 1 tablet by mouth daily.      NOVOLOG FLEXPEN 100 UNIT/ML FlexPen  Generic drug:  insulin aspart  Inject 10 Units into the skin 3 (three) times daily with meals.     oxyCODONE 5 MG immediate release tablet  Commonly known as:  Oxy IR/ROXICODONE  Take 1-2 tablets (5-10 mg total) by mouth every 4 (four) hours as needed for severe pain or breakthrough pain.     pantoprazole 40 MG tablet  Commonly known as:  PROTONIX  Take 1 tablet (40 mg total) by mouth daily.     pravastatin 40 MG tablet  Commonly known as:  PRAVACHOL  TAKE 1 TABLET EVERY EVENING     ramipril 5 MG capsule  Commonly known as:  ALTACE  Take 5 mg by mouth daily.     tiZANidine 4 MG tablet  Commonly known as:  ZANAFLEX  Take 1 tablet (4 mg total) by mouth every 6 (six) hours as needed for muscle spasms.     vitamin C 500 MG tablet  Commonly known as:  ASCORBIC ACID  Take 500 mg by mouth daily.     vitamin E 400 UNIT capsule  Take 400 Units by mouth daily.        Diagnostic Studies: Dg Knee Right Port  08/27/2015  CLINICAL DATA:  Status post partial right knee replacement EXAM: PORTABLE RIGHT KNEE - 1-2 VIEW COMPARISON:  None. FINDINGS: Medial partial knee replacement is noted. No acute bony abnormality is seen. Air is noted in the joint related to the recent surgery. IMPRESSION: Status post partial knee replacement.  No acute abnormality noted. Electronically Signed   By: Inez Catalina M.D.   On: 08/27/2015 09:51    Disposition: 01-Home or Self Care      Discharge Instructions    Discharge patient    Complete by:  As directed            Follow-up Information    Follow up with Mcarthur Rossetti, MD In 2 weeks.   Specialty:  Orthopedic Surgery   Contact information:   [place] [address] [phone]        Signed: Mcarthur Rossetti 08/28/2015, 9:48 AM

## 2015-08-28 NOTE — Progress Notes (Signed)
Pt discharged to home. DC instructions given. No concerns voiced. Prescription for 2 meds given as well. Pt encouraged to stop by [facility] pharmacy off [address] and pick up med that was e-prescribed by MD. Voiced understanding. Left unit in wheelchair pushed by nurse tech. Left in good condition. VWilliams, rn.

## 2015-08-28 NOTE — Care Management Obs Status (Signed)
[facility] NOTIFICATION   Patient Details  Name: Joel Fiebelkorn Sainsbury Sr. MRN: [id number] Date of Birth: 12-11-1945   Medicare Observation Status Notification Given:  Yes    Erenest Rasher, RN 08/28/2015, 11:36 AM

## 2015-08-30 DIAGNOSIS — I10 Essential (primary) hypertension: Secondary | ICD-10-CM | POA: Diagnosis not present

## 2015-08-30 DIAGNOSIS — E119 Type 2 diabetes mellitus without complications: Secondary | ICD-10-CM | POA: Diagnosis not present

## 2015-08-30 DIAGNOSIS — Z471 Aftercare following joint replacement surgery: Secondary | ICD-10-CM | POA: Diagnosis not present

## 2015-08-30 DIAGNOSIS — R42 Dizziness and giddiness: Secondary | ICD-10-CM | POA: Diagnosis not present

## 2015-08-30 DIAGNOSIS — M159 Polyosteoarthritis, unspecified: Secondary | ICD-10-CM | POA: Diagnosis not present

## 2015-08-30 DIAGNOSIS — I2510 Atherosclerotic heart disease of native coronary artery without angina pectoris: Secondary | ICD-10-CM | POA: Diagnosis not present

## 2015-09-01 DIAGNOSIS — M159 Polyosteoarthritis, unspecified: Secondary | ICD-10-CM | POA: Diagnosis not present

## 2015-09-01 DIAGNOSIS — Z471 Aftercare following joint replacement surgery: Secondary | ICD-10-CM | POA: Diagnosis not present

## 2015-09-01 DIAGNOSIS — R42 Dizziness and giddiness: Secondary | ICD-10-CM | POA: Diagnosis not present

## 2015-09-01 DIAGNOSIS — I2510 Atherosclerotic heart disease of native coronary artery without angina pectoris: Secondary | ICD-10-CM | POA: Diagnosis not present

## 2015-09-01 DIAGNOSIS — I10 Essential (primary) hypertension: Secondary | ICD-10-CM | POA: Diagnosis not present

## 2015-09-01 DIAGNOSIS — E119 Type 2 diabetes mellitus without complications: Secondary | ICD-10-CM | POA: Diagnosis not present

## 2015-09-03 DIAGNOSIS — M159 Polyosteoarthritis, unspecified: Secondary | ICD-10-CM | POA: Diagnosis not present

## 2015-09-03 DIAGNOSIS — Z471 Aftercare following joint replacement surgery: Secondary | ICD-10-CM | POA: Diagnosis not present

## 2015-09-03 DIAGNOSIS — I10 Essential (primary) hypertension: Secondary | ICD-10-CM | POA: Diagnosis not present

## 2015-09-03 DIAGNOSIS — E119 Type 2 diabetes mellitus without complications: Secondary | ICD-10-CM | POA: Diagnosis not present

## 2015-09-03 DIAGNOSIS — R42 Dizziness and giddiness: Secondary | ICD-10-CM | POA: Diagnosis not present

## 2015-09-03 DIAGNOSIS — I2510 Atherosclerotic heart disease of native coronary artery without angina pectoris: Secondary | ICD-10-CM | POA: Diagnosis not present

## 2015-09-06 DIAGNOSIS — I10 Essential (primary) hypertension: Secondary | ICD-10-CM | POA: Diagnosis not present

## 2015-09-06 DIAGNOSIS — Z471 Aftercare following joint replacement surgery: Secondary | ICD-10-CM | POA: Diagnosis not present

## 2015-09-06 DIAGNOSIS — M159 Polyosteoarthritis, unspecified: Secondary | ICD-10-CM | POA: Diagnosis not present

## 2015-09-06 DIAGNOSIS — I2510 Atherosclerotic heart disease of native coronary artery without angina pectoris: Secondary | ICD-10-CM | POA: Diagnosis not present

## 2015-09-06 DIAGNOSIS — E119 Type 2 diabetes mellitus without complications: Secondary | ICD-10-CM | POA: Diagnosis not present

## 2015-09-06 DIAGNOSIS — R42 Dizziness and giddiness: Secondary | ICD-10-CM | POA: Diagnosis not present

## 2015-09-08 DIAGNOSIS — R42 Dizziness and giddiness: Secondary | ICD-10-CM | POA: Diagnosis not present

## 2015-09-08 DIAGNOSIS — Z471 Aftercare following joint replacement surgery: Secondary | ICD-10-CM | POA: Diagnosis not present

## 2015-09-08 DIAGNOSIS — M159 Polyosteoarthritis, unspecified: Secondary | ICD-10-CM | POA: Diagnosis not present

## 2015-09-08 DIAGNOSIS — I10 Essential (primary) hypertension: Secondary | ICD-10-CM | POA: Diagnosis not present

## 2015-09-08 DIAGNOSIS — I2510 Atherosclerotic heart disease of native coronary artery without angina pectoris: Secondary | ICD-10-CM | POA: Diagnosis not present

## 2015-09-08 DIAGNOSIS — E119 Type 2 diabetes mellitus without complications: Secondary | ICD-10-CM | POA: Diagnosis not present

## 2015-09-09 DIAGNOSIS — M1711 Unilateral primary osteoarthritis, right knee: Secondary | ICD-10-CM | POA: Diagnosis not present

## 2015-09-13 ENCOUNTER — Other Ambulatory Visit: Payer: Self-pay | Admitting: Orthopaedic Surgery

## 2015-09-13 ENCOUNTER — Ambulatory Visit
Admission: RE | Admit: 2015-09-13 | Discharge: 2015-09-13 | Disposition: A | Discharge status: Home / Self Care | Payer: Medicare Other | Source: Ambulatory Visit | Attending: Orthopaedic Surgery | Admitting: Orthopaedic Surgery

## 2015-09-13 DIAGNOSIS — M79604 Pain in right leg: Secondary | ICD-10-CM

## 2015-09-13 DIAGNOSIS — M79661 Pain in right lower leg: Secondary | ICD-10-CM | POA: Diagnosis not present

## 2015-09-13 DIAGNOSIS — I2510 Atherosclerotic heart disease of native coronary artery without angina pectoris: Secondary | ICD-10-CM | POA: Diagnosis not present

## 2015-09-13 DIAGNOSIS — I10 Essential (primary) hypertension: Secondary | ICD-10-CM | POA: Diagnosis not present

## 2015-09-13 DIAGNOSIS — E119 Type 2 diabetes mellitus without complications: Secondary | ICD-10-CM | POA: Diagnosis not present

## 2015-09-13 DIAGNOSIS — R42 Dizziness and giddiness: Secondary | ICD-10-CM | POA: Diagnosis not present

## 2015-09-13 DIAGNOSIS — Z471 Aftercare following joint replacement surgery: Secondary | ICD-10-CM | POA: Diagnosis not present

## 2015-09-13 DIAGNOSIS — M159 Polyosteoarthritis, unspecified: Secondary | ICD-10-CM | POA: Diagnosis not present

## 2015-09-15 DIAGNOSIS — R42 Dizziness and giddiness: Secondary | ICD-10-CM | POA: Diagnosis not present

## 2015-09-15 DIAGNOSIS — E119 Type 2 diabetes mellitus without complications: Secondary | ICD-10-CM | POA: Diagnosis not present

## 2015-09-15 DIAGNOSIS — Z471 Aftercare following joint replacement surgery: Secondary | ICD-10-CM | POA: Diagnosis not present

## 2015-09-15 DIAGNOSIS — M159 Polyosteoarthritis, unspecified: Secondary | ICD-10-CM | POA: Diagnosis not present

## 2015-09-15 DIAGNOSIS — I10 Essential (primary) hypertension: Secondary | ICD-10-CM | POA: Diagnosis not present

## 2015-09-15 DIAGNOSIS — I2510 Atherosclerotic heart disease of native coronary artery without angina pectoris: Secondary | ICD-10-CM | POA: Diagnosis not present

## 2015-09-17 DIAGNOSIS — I2510 Atherosclerotic heart disease of native coronary artery without angina pectoris: Secondary | ICD-10-CM | POA: Diagnosis not present

## 2015-09-17 DIAGNOSIS — Z471 Aftercare following joint replacement surgery: Secondary | ICD-10-CM | POA: Diagnosis not present

## 2015-09-17 DIAGNOSIS — E119 Type 2 diabetes mellitus without complications: Secondary | ICD-10-CM | POA: Diagnosis not present

## 2015-09-17 DIAGNOSIS — M159 Polyosteoarthritis, unspecified: Secondary | ICD-10-CM | POA: Diagnosis not present

## 2015-09-17 DIAGNOSIS — R42 Dizziness and giddiness: Secondary | ICD-10-CM | POA: Diagnosis not present

## 2015-09-17 DIAGNOSIS — I10 Essential (primary) hypertension: Secondary | ICD-10-CM | POA: Diagnosis not present

## 2015-10-07 DIAGNOSIS — M1711 Unilateral primary osteoarthritis, right knee: Secondary | ICD-10-CM | POA: Diagnosis not present

## 2015-10-18 DIAGNOSIS — M1711 Unilateral primary osteoarthritis, right knee: Secondary | ICD-10-CM | POA: Diagnosis not present

## 2015-10-25 ENCOUNTER — Other Ambulatory Visit: Payer: Self-pay | Admitting: Orthopaedic Surgery

## 2015-10-25 DIAGNOSIS — M25561 Pain in right knee: Secondary | ICD-10-CM

## 2015-11-02 ENCOUNTER — Ambulatory Visit
Admission: RE | Admit: 2015-11-02 | Discharge: 2015-11-02 | Disposition: A | Discharge status: Home / Self Care | Payer: Medicare Other | Source: Ambulatory Visit | Attending: Orthopaedic Surgery | Admitting: Orthopaedic Surgery

## 2015-11-02 DIAGNOSIS — M25561 Pain in right knee: Secondary | ICD-10-CM

## 2015-11-02 DIAGNOSIS — M25461 Effusion, right knee: Secondary | ICD-10-CM | POA: Diagnosis not present

## 2015-11-23 ENCOUNTER — Ambulatory Visit: Payer: Medicare Other | Admitting: *Deleted

## 2015-11-24 ENCOUNTER — Ambulatory Visit: Payer: Medicare Other | Admitting: *Deleted

## 2015-12-01 ENCOUNTER — Encounter: Payer: Medicare Other | Attending: Family Medicine | Admitting: Nutrition

## 2015-12-01 DIAGNOSIS — E119 Type 2 diabetes mellitus without complications: Secondary | ICD-10-CM

## 2015-12-06 DIAGNOSIS — M1711 Unilateral primary osteoarthritis, right knee: Secondary | ICD-10-CM | POA: Diagnosis not present

## 2015-12-07 ENCOUNTER — Encounter: Payer: Medicare Other | Admitting: Nutrition

## 2015-12-07 DIAGNOSIS — E119 Type 2 diabetes mellitus without complications: Secondary | ICD-10-CM

## 2015-12-08 ENCOUNTER — Telehealth: Payer: Self-pay | Admitting: Nutrition

## 2015-12-08 DIAGNOSIS — E119 Type 2 diabetes mellitus without complications: Secondary | ICD-10-CM

## 2015-12-08 NOTE — Patient Instructions (Signed)
Call if blood sugars go over 250. Please be aware that blood sugars will come down in a few days to 2 weeks, and we will need to reset the basal rates to prevent low blood sugars.

## 2015-12-08 NOTE — Telephone Encounter (Signed)
Patient reported that his pump began it's basal delivery at 7AM today, and he had no difficulty with giving the bolues for Breakfast and lunch.  He reported that his blood sugar before lunch today was 201.  No changed made to settings.

## 2015-12-08 NOTE — Patient Instructions (Signed)
Read over manual about how to give the bolus and how to fill a cartridge/infusion set.

## 2015-12-08 NOTE — Telephone Encounter (Signed)
Patient and wife were here today to do a cartridge and infusion set change.  He required some assistance from me with this.   His FBSs last 2 days were 152, and 146, and acL: 201, 218.  acS last night: 153.   I/C ratio changed from 20 to 15 acB.   Basal rate: no change.  0.65 We discussed care of the pump, alerts and alarms ( which the patient has had none), and what not to do with this pump.  He reported good understanding of this and had no final questions. Will see him on Tues to review check list.

## 2015-12-08 NOTE — Assessment & Plan Note (Signed)
Patient reported no difficulty giving bolus tonight before supper.  Blood sugar 2 hr. After supper: 189

## 2015-12-08 NOTE — Assessment & Plan Note (Addendum)
Joel Jones and his wife are hear to start the [facility] insulin pump.  He took his long acting insulin this AM before coming this morning, and he did not take his pump out and look over any of the instructions.  He also does not know how to carb count, despite telling me this over the phone last week. The first hour of training was on carb counting.  He was given a handout with 15 gram carb portions and his wife reported good understanding of how to do this.   He was shown how to fil a cartridge, insert the Rapid D infusion sets, and redomonstrated this with much assistance from me.  He filled a cartridge with Novolog insulin, and inserted the needle into his R upper quadrant with no discomfort.  His pump was put in a 100% reduction mode until 7AM tomorrow morning. He is currently taking 15u of Lantus, and 10u of Novolog for each meal.   Settings were put into the pump:  Of basal rate: 0.75 due to his high blood sugars which he says have been running" in the 200s-250s most of the time.  I/C ratio: 20, ISF, 70, timing 4 hours.  His blood sugar at the time of the pump start was 321.  He was shown how to give a EZbg correction bolus of 3.2u, and reported good understanding of how to do this type of bolus. We reviewed how to give a bolus 4 times, and he re demonstrated this correctly X2.  His wife says she has a very good understanding of how to do this, and will help him with the carb counting, and giving the bolus.   Both patient and wife felt like they could not handle any more information about the pump at this time.   I assured them that this is a slow process,and we will work at his readiness. I will call him tonight to see how he is doing.

## 2015-12-08 NOTE — Telephone Encounter (Signed)
Pt. Reported on Friday that his blood sugar before supper yesterday was 157, and at HS was 172,  FBS today was 187, and acL: 201, acS today was 146 I will see him tomorrow for a cartridge and infusion set change.

## 2015-12-08 NOTE — Assessment & Plan Note (Signed)
Patient reported blood sugars have been in the 200s and 300s ,last 2 days, and even went to 500 last night.  He reported having gotten a cortisone shot yesterday morning.  He did not do a correction bolus when the blood sugar was so high last night, and FBS was 323 this AM.   We reviewed how to do a correction bolus again and when to do this.  Written instructions were given for this.  He reports that he has no difficulty with giving meal time boluses, and has no questions about carb counting--saying his wife is helping him with this.   He was shown how to do a temp basal rate, but seemed unsure of this.  Since the [facility] pump will only do a 12 hour temp basal increase, I changed his basal rates to 0.75, and changed his I/C ratio to 15 for all meals, and sensitivity to 45.  I explained that his blood sugars will remain high for several days, but that he should contact me if over 250.  He will call me tomorrow with blood sugar readings.  He was also made aware the the blood sugar readings will come down after a few days to 2 weeks, and he needs to contact me to change the basal rates back.  He agreed to do this.

## 2015-12-10 DIAGNOSIS — M4726 Other spondylosis with radiculopathy, lumbar region: Secondary | ICD-10-CM | POA: Diagnosis not present

## 2015-12-10 DIAGNOSIS — M7701 Medial epicondylitis, right elbow: Secondary | ICD-10-CM | POA: Diagnosis not present

## 2015-12-10 DIAGNOSIS — M1711 Unilateral primary osteoarthritis, right knee: Secondary | ICD-10-CM | POA: Diagnosis not present

## 2015-12-10 DIAGNOSIS — E1100 Type 2 diabetes mellitus with hyperosmolarity without nonketotic hyperglycemic-hyperosmolar coma (NKHHC): Secondary | ICD-10-CM | POA: Diagnosis not present

## 2015-12-28 DIAGNOSIS — E1165 Type 2 diabetes mellitus with hyperglycemia: Secondary | ICD-10-CM | POA: Diagnosis not present

## 2015-12-28 DIAGNOSIS — I10 Essential (primary) hypertension: Secondary | ICD-10-CM | POA: Diagnosis not present

## 2015-12-28 DIAGNOSIS — N182 Chronic kidney disease, stage 2 (mild): Secondary | ICD-10-CM | POA: Diagnosis not present

## 2015-12-28 DIAGNOSIS — Z794 Long term (current) use of insulin: Secondary | ICD-10-CM | POA: Diagnosis not present

## 2015-12-28 DIAGNOSIS — E1122 Type 2 diabetes mellitus with diabetic chronic kidney disease: Secondary | ICD-10-CM | POA: Diagnosis not present

## 2016-01-04 ENCOUNTER — Encounter: Payer: Medicare Other | Attending: Family Medicine | Admitting: Nutrition

## 2016-01-04 NOTE — Patient Instructions (Signed)
Call [facility] help line if questions about pump workings

## 2016-01-04 NOTE — Progress Notes (Signed)
We reviewed all topics of his insulin pump, and he signed off as understanding all topics on the pump training checklist,  with no final questions.  He has seen Dr. Buddy Duty, who has made changes to his basal rate.  He admits that he did not know how to make the changes to his basal rate, so we reviewed how to do this,and he reported that he remembered this.  He was told to call Dr. Cindra Eves office if blood sugars go up/down, and to call the help line if he has questions about his pump functions.   He reports being able to count carbs accurately, and to give a carb smart bolus without any difficulty.   They had no final questions.

## 2016-01-05 DIAGNOSIS — M25561 Pain in right knee: Secondary | ICD-10-CM | POA: Diagnosis not present

## 2016-01-05 DIAGNOSIS — M1711 Unilateral primary osteoarthritis, right knee: Secondary | ICD-10-CM | POA: Diagnosis not present

## 2016-01-25 DIAGNOSIS — R21 Rash and other nonspecific skin eruption: Secondary | ICD-10-CM | POA: Diagnosis not present

## 2016-01-31 DIAGNOSIS — E1165 Type 2 diabetes mellitus with hyperglycemia: Secondary | ICD-10-CM | POA: Diagnosis not present

## 2016-01-31 DIAGNOSIS — N182 Chronic kidney disease, stage 2 (mild): Secondary | ICD-10-CM | POA: Diagnosis not present

## 2016-01-31 DIAGNOSIS — E1122 Type 2 diabetes mellitus with diabetic chronic kidney disease: Secondary | ICD-10-CM | POA: Diagnosis not present

## 2016-01-31 DIAGNOSIS — I10 Essential (primary) hypertension: Secondary | ICD-10-CM | POA: Diagnosis not present

## 2016-01-31 DIAGNOSIS — Z794 Long term (current) use of insulin: Secondary | ICD-10-CM | POA: Diagnosis not present

## 2016-02-14 ENCOUNTER — Ambulatory Visit (INDEPENDENT_AMBULATORY_CARE_PROVIDER_SITE_OTHER): Payer: Medicare Other | Admitting: Physician Assistant

## 2016-02-14 DIAGNOSIS — M1711 Unilateral primary osteoarthritis, right knee: Secondary | ICD-10-CM | POA: Diagnosis not present

## 2016-03-02 ENCOUNTER — Telehealth: Payer: Self-pay | Admitting: *Deleted

## 2016-03-02 DIAGNOSIS — Z0000 Encounter for general adult medical examination without abnormal findings: Secondary | ICD-10-CM | POA: Diagnosis not present

## 2016-03-02 NOTE — Telephone Encounter (Signed)
Copy of pump settings received via fax from Dr. Cindra Eves office.  The following settings were entered into the new pump:   Pump Settings: Date: Current Target: 100 +/- 10   Basal Rate: Carb Ratio Sensitivity  MN: 0.850 MN: 15 50  5:30 A 0.850 7 A:  10   9:30 P 0.850 11:30 A: 15                        Plan: follow up with Dr. Buddy Duty as needed. Provided my phone number if any further questions.

## 2016-03-03 ENCOUNTER — Other Ambulatory Visit: Payer: Self-pay | Admitting: Family Medicine

## 2016-03-03 DIAGNOSIS — R42 Dizziness and giddiness: Secondary | ICD-10-CM

## 2016-03-10 ENCOUNTER — Ambulatory Visit
Admission: RE | Admit: 2016-03-10 | Discharge: 2016-03-10 | Disposition: A | Discharge status: Home / Self Care | Payer: Medicare Other | Source: Ambulatory Visit | Attending: Family Medicine | Admitting: Family Medicine

## 2016-03-10 DIAGNOSIS — R42 Dizziness and giddiness: Secondary | ICD-10-CM

## 2016-03-10 DIAGNOSIS — I6523 Occlusion and stenosis of bilateral carotid arteries: Secondary | ICD-10-CM | POA: Diagnosis not present

## 2016-03-14 DIAGNOSIS — Z794 Long term (current) use of insulin: Secondary | ICD-10-CM | POA: Diagnosis not present

## 2016-03-14 DIAGNOSIS — I10 Essential (primary) hypertension: Secondary | ICD-10-CM | POA: Diagnosis not present

## 2016-03-14 DIAGNOSIS — E1122 Type 2 diabetes mellitus with diabetic chronic kidney disease: Secondary | ICD-10-CM | POA: Diagnosis not present

## 2016-03-14 DIAGNOSIS — E1165 Type 2 diabetes mellitus with hyperglycemia: Secondary | ICD-10-CM | POA: Diagnosis not present

## 2016-03-14 DIAGNOSIS — N182 Chronic kidney disease, stage 2 (mild): Secondary | ICD-10-CM | POA: Diagnosis not present

## 2016-03-23 ENCOUNTER — Ambulatory Visit (INDEPENDENT_AMBULATORY_CARE_PROVIDER_SITE_OTHER): Payer: Medicare Other | Admitting: Orthopaedic Surgery

## 2016-03-23 DIAGNOSIS — M25461 Effusion, right knee: Secondary | ICD-10-CM

## 2016-03-23 DIAGNOSIS — M25561 Pain in right knee: Secondary | ICD-10-CM

## 2016-03-23 DIAGNOSIS — G8929 Other chronic pain: Secondary | ICD-10-CM | POA: Diagnosis not present

## 2016-03-23 DIAGNOSIS — Z96651 Presence of right artificial knee joint: Secondary | ICD-10-CM | POA: Diagnosis not present

## 2016-03-23 NOTE — Progress Notes (Signed)
Office Visit Note   Patient: Joel Yackel Desilets Sr.           Date of Birth: [birth year]           MRN: [id number] Visit Date: 03/23/2016              Requested by: [name], MD [id number], [place] [address] PCP: [name], MD   Assessment & Plan: Visit Diagnoses:  1. Chronic pain of right knee   2. Effusion, right knee   3. Status post right partial knee replacement     Plan: I was able to clean his right knee with Betadine and alcohol and then just place 18-gauge needle in the knee and drained off 50 mL of serosanguineous fluid from his knee. His appear to be infected but certainly worrisome for an internal derangement of his knee. We've aspirated his knee since surgery before found no infection. I'm concerned that he is heading toward needing a revision to a total knee replacement. We Rubel consider at least arthroscopic intervention in the near future. I will like to see him back in 1 week to see how his knee is doing. I did not place a steroid injection in his knee due to his blood glucose running about 500 earlier today. I counseled him about this.  Follow-Up Instructions: Return in about 1 week (around 03/30/2016).   Orders:  No orders of the defined types were placed in this encounter.  No orders of the defined types were placed in this encounter.     Procedures: No procedures performed   Clinical Data: No additional findings.   Subjective: Chief Complaint  Patient presents with  . Right Knee - Edema    Patient knee is swelling a lot recently, getting more painful and hard to walk at times.    HPI  Review of Systems   Objective: Vital Signs: There were no vitals taken for this visit.  Physical Exam  Ortho Exam His right knee deathly shows a large effusion. There is no redness. He is a painful arc of motion. His pain seems to be a lateral joint line. Specialty Comments:  No specialty comments available.  Imaging: No results found.   PMFS  History: Patient Active Problem List   Diagnosis Date Noted  . Osteoarthritis of right knee 08/27/2015  . S/P right unicompartmental knee replacement 08/27/2015  . Essential hypertension 05/11/2015  . Pain in the chest   . Right groin pain 08/15/2012  . Chest pain 04/02/2012  . Cerebrovascular disease 04/02/2012  . CAD (coronary artery disease) 04/02/2012  . Diabetes mellitus without complication ([place])   . Dizziness   . Sinus bradycardia   . Hyperlipidemia   . Edema    Past Medical History:  Diagnosis Date  . Anemia   . Arthritis    "all over"  . Bradycardia   . Chronic bronchitis ([place])    "get it just about q yr" (03/17/2014)  . Daily headache    "here lately" (03/17/2014); relates to sinuses  . GERD (gastroesophageal reflux disease)   . Hard of hearing    hearing aides bilat  . Hiatal hernia   . History of blood transfusion 2008   "related to OR"  . History of kidney stones   . Hyperlipidemia   . Hypertension   . Pneumonia 1972 X 1  . Type II diabetes mellitus ([place])   . Wears glasses     Family History  Problem Relation Age  of Onset  . Heart disease      No family history  . Cancer Father     prostate    Past Surgical History:  Procedure Laterality Date  . broken finger      surgical repaired left hand 2nd finger   . cyst removed      per left knee/posteriorly  . CYSTECTOMY Left 2009   "behind knee"  . INGUINAL HERNIA REPAIR Right ?2010  . JOINT REPLACEMENT    . KNEE ARTHROSCOPY Bilateral 1980's  . PARTIAL KNEE ARTHROPLASTY Right 08/27/2015   Procedure: RIGHT UNICOMPARTMENTAL KNEE ARTHROPLASTY;  Surgeon: Mcarthur Rossetti, MD;  Location: [facility];  Service: Orthopedics;  Laterality: Right;  . REVISION TOTAL KNEE ARTHROPLASTY Left 2008  . TOTAL KNEE ARTHROPLASTY Left 2003   Social History   Occupational History  .  Unemployed   Social History Main Topics  . Smoking status: Former Smoker    Packs/day: 1.00    Years: 5.00    Types: Cigarettes,  Cigars    Quit date: 05/15/1968  . Smokeless tobacco: Never Used  . Alcohol use No  . Drug use: No  . Sexual activity: Yes

## 2016-03-30 ENCOUNTER — Ambulatory Visit (INDEPENDENT_AMBULATORY_CARE_PROVIDER_SITE_OTHER): Payer: Medicare Other | Admitting: Orthopaedic Surgery

## 2016-03-30 DIAGNOSIS — G8929 Other chronic pain: Secondary | ICD-10-CM

## 2016-03-30 DIAGNOSIS — M25561 Pain in right knee: Secondary | ICD-10-CM

## 2016-03-30 DIAGNOSIS — M25461 Effusion, right knee: Secondary | ICD-10-CM | POA: Diagnosis not present

## 2016-03-30 NOTE — Telephone Encounter (Signed)
Patient has a question about a pain medication he was going to take but wanted to see if Dr. Ninfa Linden thinks its ok. Please give patient a call back.

## 2016-03-30 NOTE — Progress Notes (Signed)
Office Visit Note   Patient: Joel Beichler Woon Sr.           Date of Birth: [birth year]           MRN: [id number] Visit Date: 03/30/2016              Requested by: [name], MD [phone], [place] [address] PCP: [name], MD   Assessment & Plan: Visit Diagnoses:  1. Chronic pain of right knee   2. Effusion, right knee     Plan: Given his recurrent effusions of his right knee at this point I feel that her right knee arthroscopy with a partial synovectomy and assessment lateral compartment is warranted. We've aspirated this knee on several occasions post his right medial unicompartmental knee replacement. He had done well for a while until an accident with that knee. I suspect he Deshazer have a lateral meniscal tear because it hurts on the lateral joint line. I told him a length about this. I aspirated an additional 20 mL of fluid from his knee today as well in place a steroid injection in the knee. I think worst-case scenario would be a revision from his unicompartmental knee to a total knee. We'll work on getting the surgery set up in the near future that I would see him back at one week postoperative for suture removal. Detailed discussion of risk and benefits of this was had with him as well.  Follow-Up Instructions: Return for 1 week post-op.   Orders:  No orders of the defined types were placed in this encounter.  No orders of the defined types were placed in this encounter.     Procedures: No procedures performed   Clinical Data: No additional findings.   Subjective: Chief Complaint  Patient presents with  . Right Knee - Follow-up    Aspiration of the knee 03/23/16. States this didn't work very well. He's still in quite a bit of pain. Wearing knee sleeve.    HPI He continues to have recurrent effusions of his right knee status post a right partial knee replacement. The partially reduced placement was performed 7 months ago and he had done well with this until  an accident where he tripped over a guide. Review of Systems   Objective: Vital Signs: There were no vitals taken for this visit.  Physical Exam  Ortho Exam His right knee is not red but it does show a mild to moderate effusion. He has lateral joint line tenderness and pain on range of motion of his knee. Specialty Comments:  No specialty comments available.  Imaging: No results found.   PMFS History: Patient Active Problem List   Diagnosis Date Noted  . Osteoarthritis of right knee 08/27/2015  . S/P right unicompartmental knee replacement 08/27/2015  . Essential hypertension 05/11/2015  . Pain in the chest   . Right groin pain 08/15/2012  . Chest pain 04/02/2012  . Cerebrovascular disease 04/02/2012  . CAD (coronary artery disease) 04/02/2012  . Diabetes mellitus without complication ([place])   . Dizziness   . Sinus bradycardia   . Hyperlipidemia   . Edema    Past Medical History:  Diagnosis Date  . Anemia   . Arthritis    "all over"  . Bradycardia   . Chronic bronchitis ([place])    "get it just about q yr" (03/17/2014)  . Daily headache    "here lately" (03/17/2014); relates to sinuses  . GERD (gastroesophageal reflux disease)   .  Hard of hearing    hearing aides bilat  . Hiatal hernia   . History of blood transfusion 2008   "related to OR"  . History of kidney stones   . Hyperlipidemia   . Hypertension   . Pneumonia 1972 X 1  . Type II diabetes mellitus ([place])   . Wears glasses     Family History  Problem Relation Age of Onset  . Heart disease      No family history  . Cancer Father     prostate    Past Surgical History:  Procedure Laterality Date  . broken finger      surgical repaired left hand 2nd finger   . cyst removed      per left knee/posteriorly  . CYSTECTOMY Left 2009   "behind knee"  . INGUINAL HERNIA REPAIR Right ?2010  . JOINT REPLACEMENT    . KNEE ARTHROSCOPY Bilateral 1980's  . PARTIAL KNEE ARTHROPLASTY Right 08/27/2015   Procedure:  RIGHT UNICOMPARTMENTAL KNEE ARTHROPLASTY;  Surgeon: Mcarthur Rossetti, MD;  Location: [facility];  Service: Orthopedics;  Laterality: Right;  . REVISION TOTAL KNEE ARTHROPLASTY Left 2008  . TOTAL KNEE ARTHROPLASTY Left 2003   Social History   Occupational History  .  Unemployed   Social History Main Topics  . Smoking status: Former Smoker    Packs/day: 1.00    Years: 5.00    Types: Cigarettes, Cigars    Quit date: 05/15/1968  . Smokeless tobacco: Never Used  . Alcohol use No  . Drug use: No  . Sexual activity: Yes

## 2016-03-31 NOTE — Telephone Encounter (Signed)
Patient states her question was already answered.

## 2016-04-10 ENCOUNTER — Ambulatory Visit (INDEPENDENT_AMBULATORY_CARE_PROVIDER_SITE_OTHER): Payer: Medicare Other | Admitting: Orthopaedic Surgery

## 2016-04-10 ENCOUNTER — Ambulatory Visit (INDEPENDENT_AMBULATORY_CARE_PROVIDER_SITE_OTHER): Payer: Medicare Other

## 2016-04-10 DIAGNOSIS — M25531 Pain in right wrist: Secondary | ICD-10-CM

## 2016-04-10 NOTE — Progress Notes (Signed)
Office Visit Note   Patient: Joel Batterton Dials Sr.           Date of Birth: 10-17-1945           MRN: [id number] Visit Date: 04/10/2016              Requested by: [name], MD [phone], [place] [address] PCP: [name], MD   Assessment & Plan: Visit Diagnoses:  1. Pain in right wrist     Plan: Most likely this is a deep contusion to the ulnar side of his right wrist and should do well with time. We'll try Velcro wrist splint and see if this helps him. He has an arthroscopic intervention on his right knee in the near future. We'll see him back at his regular follow-up after that point postoperatively. He does have pain medication and anti-inflammatories at home. He'll increase activity that wrist as comfort allows. I've noted he says his right knee swelling has decreased recently since my last aspiration and steroid injection.  Follow-Up Instructions: Return for 1 week post-op.   Orders:  Orders Placed This Encounter  Procedures  . XR Wrist Complete Right   No orders of the defined types were placed in this encounter.     Procedures: No procedures performed   Clinical Data: No additional findings.   Subjective: Chief Complaint  Patient presents with  . Right Wrist - Injury, Pain    03/30/16 patients knee gave out and he caught himself and hit right wrist. Pain ever since. Woke him throbbing lastnight.     HPI He injured this right dominant wrist when he hit it against a door frame try and do support himself when his knee had given out on him. Review of Systems   Objective: Vital Signs: There were no vitals taken for this visit.  Physical Exam He is alert and oriented 3. Ortho Exam Examination of his right wrist shows just some slight swelling on the ulnar side and there is pain to palpation. His range of motion is full but it is painful to the ulnar side of his wrist. There is no crepitation. There is no skin abnormalities other than swelling.  His ligamentous and tendinous exams are normal. Specialty Comments:  No specialty comments available.  Imaging: Xr Wrist Complete Right  Result Date: 04/10/2016 3 views of his right wrist are obtained and I see no acute bony injury. The alignment of the wrist joint and the carpal bones as well as the lateral aspect of his wrist for a hurts is normal. I see no evidence of fracture.    PMFS History: Patient Active Problem List   Diagnosis Date Noted  . Osteoarthritis of right knee 08/27/2015  . S/P right unicompartmental knee replacement 08/27/2015  . Essential hypertension 05/11/2015  . Pain in the chest   . Right groin pain 08/15/2012  . Chest pain 04/02/2012  . Cerebrovascular disease 04/02/2012  . CAD (coronary artery disease) 04/02/2012  . Diabetes mellitus without complication ([place])   . Dizziness   . Sinus bradycardia   . Hyperlipidemia   . Edema    Past Medical History:  Diagnosis Date  . Anemia   . Arthritis    "all over"  . Bradycardia   . Chronic bronchitis ([place])    "get it just about q yr" (03/17/2014)  . Daily headache    "here lately" (03/17/2014); relates to sinuses  . GERD (gastroesophageal reflux disease)   . Hard of  hearing    hearing aides bilat  . Hiatal hernia   . History of blood transfusion 2008   "related to OR"  . History of kidney stones   . Hyperlipidemia   . Hypertension   . Pneumonia 1972 X 1  . Type II diabetes mellitus ([place])   . Wears glasses     Family History  Problem Relation Age of Onset  . Heart disease      No family history  . Cancer Father     prostate    Past Surgical History:  Procedure Laterality Date  . broken finger      surgical repaired left hand 2nd finger   . cyst removed      per left knee/posteriorly  . CYSTECTOMY Left 2009   "behind knee"  . INGUINAL HERNIA REPAIR Right ?2010  . JOINT REPLACEMENT    . KNEE ARTHROSCOPY Bilateral 1980's  . PARTIAL KNEE ARTHROPLASTY Right 08/27/2015   Procedure: RIGHT  UNICOMPARTMENTAL KNEE ARTHROPLASTY;  Surgeon: Mcarthur Rossetti, MD;  Location: [facility];  Service: Orthopedics;  Laterality: Right;  . REVISION TOTAL KNEE ARTHROPLASTY Left 2008  . TOTAL KNEE ARTHROPLASTY Left 2003   Social History   Occupational History  .  Unemployed   Social History Main Topics  . Smoking status: Former Smoker    Packs/day: 1.00    Years: 5.00    Types: Cigarettes, Cigars    Quit date: 05/15/1968  . Smokeless tobacco: Never Used  . Alcohol use No  . Drug use: No  . Sexual activity: Yes

## 2016-04-13 ENCOUNTER — Ambulatory Visit (INDEPENDENT_AMBULATORY_CARE_PROVIDER_SITE_OTHER): Payer: Medicare Other | Admitting: Physician Assistant

## 2016-04-13 ENCOUNTER — Ambulatory Visit (INDEPENDENT_AMBULATORY_CARE_PROVIDER_SITE_OTHER): Payer: Self-pay

## 2016-04-13 ENCOUNTER — Encounter (INDEPENDENT_AMBULATORY_CARE_PROVIDER_SITE_OTHER): Payer: Self-pay | Admitting: Physician Assistant

## 2016-04-13 ENCOUNTER — Ambulatory Visit (INDEPENDENT_AMBULATORY_CARE_PROVIDER_SITE_OTHER): Payer: Medicare Other

## 2016-04-13 VITALS — Ht 66.0 in | Wt 195.0 lb

## 2016-04-13 DIAGNOSIS — M25512 Pain in left shoulder: Secondary | ICD-10-CM

## 2016-04-13 MED ORDER — LIDOCAINE HCL 1 % IJ SOLN
3.0000 mL | INTRAMUSCULAR | Status: AC | PRN
  Administered 2016-04-13: 3 mL

## 2016-04-13 MED ORDER — METHYLPREDNISOLONE ACETATE 40 MG/ML IJ SUSP
40.0000 mg | INTRAMUSCULAR | Status: AC | PRN
  Administered 2016-04-13: 40 mg via INTRA_ARTICULAR

## 2016-04-13 NOTE — Progress Notes (Signed)
Office Visit Note   Patient: Joel Roerig Pasqua Sr.           Date of Birth: 22-Jun-1945           MRN: [id number] Visit Date: 04/13/2016              Requested by: [name], MD [phone], [place] [address] PCP: [name], MD   Assessment & Plan: Visit Diagnoses:  1. Acute pain of left shoulder     Plan: Postinjection I worked with him doing forward flexion exercises both shoulders. Also showed him pendulum exercises, wall crawls and Codman exercises. He will monitor his glucose levels closely. Continue Mobic and no other NSAIDs while on Mobic   Follow-Up Instructions: Return in about 2 weeks (around 04/27/2016).   Orders:  Orders Placed This Encounter  Procedures  . Large Joint Injection/Arthrocentesis  . XR Shoulder Right  . XR Shoulder Left   No orders of the defined types were placed in this encounter.     Procedures: Large Joint Inj Date/Time: 04/13/2016 4:30 PM Performed by: Pete Pelt Authorized by: Pete Pelt   Consent Given by:  Patient Site marked: the procedure site was marked   Indications:  Pain Location:  Shoulder Site:  L subacromial bursa Needle Size:  22 G Needle Length:  1.5 inches Approach:  Anterolateral Ultrasound Guidance: No   Fluoroscopic Guidance: No   Arthrogram: No   Medications:  40 mg methylPREDNISolone acetate 40 MG/ML; 3 mL lidocaine 1 % Aspiration Attempted: No   Patient tolerance:  Patient tolerated the procedure well with no immediate complications     Clinical Data: No additional findings.   Subjective: Chief Complaint  Patient presents with  . Left Shoulder - Pain    Patient presents today with left shoulder pain. He was picking up wood yesterday and moving barrels. Into the night he was have acute pain left shoulder. He has decreased ROM. He is taking advil regularly. He is unable to take the meloxicam. He states that his glucose levels when running anywhere from 60-350 something. States the  reading of 350 was after eating a banana cookie today.    Review of Systems See HPI  Objective: Vital Signs: Ht 5\' 6"  (1.676 m)   Wt 195 lb (88.5 kg)   BMI 31.47 kg/m   Physical Exam  Constitutional: He is oriented to person, place, and time. He appears well-developed and well-nourished. He appears distressed.  Distressed due to shoulder pain   Pulmonary/Chest: Effort normal.  Neurological: He is alert and oriented to person, place, and time.  Skin: He is not diaphoretic.  Psychiatric: He has a normal mood and affect. His behavior is normal.    Ortho Exam Left shoulder he has tenderness over the bicipital groove region and also over the greater tuberosity region the left shoulder. Negative crossover test. Positive impingement testing on the left. Very limited external rotation of the left shoulder. 5 out of 5 strength with external/internal rotation against resistance bilaterally. Flexion of the left shoulder approximately 100 prior to injection post injection 160. Specialty Comments:  No specialty comments available.  Imaging: No results found.   PMFS History: Patient Active Problem List   Diagnosis Date Noted  . Osteoarthritis of right knee 08/27/2015  . S/P right unicompartmental knee replacement 08/27/2015  . Essential hypertension 05/11/2015  . Pain in the chest   . Right groin pain 08/15/2012  . Chest pain 04/02/2012  . Cerebrovascular disease  04/02/2012  . CAD (coronary artery disease) 04/02/2012  . Diabetes mellitus without complication ([place])   . Dizziness   . Sinus bradycardia   . Hyperlipidemia   . Edema    Past Medical History:  Diagnosis Date  . Anemia   . Arthritis    "all over"  . Bradycardia   . Chronic bronchitis ([place])    "get it just about q yr" (03/17/2014)  . Daily headache    "here lately" (03/17/2014); relates to sinuses  . GERD (gastroesophageal reflux disease)   . Hard of hearing    hearing aides bilat  . Hiatal hernia   . History  of blood transfusion 2008   "related to OR"  . History of kidney stones   . Hyperlipidemia   . Hypertension   . Pneumonia 1972 X 1  . Type II diabetes mellitus ([place])   . Wears glasses     Family History  Problem Relation Age of Onset  . Cancer Father     prostate  . Heart disease      No family history    Past Surgical History:  Procedure Laterality Date  . broken finger      surgical repaired left hand 2nd finger   . cyst removed      per left knee/posteriorly  . CYSTECTOMY Left 2009   "behind knee"  . INGUINAL HERNIA REPAIR Right ?2010  . JOINT REPLACEMENT    . KNEE ARTHROSCOPY Bilateral 1980's  . PARTIAL KNEE ARTHROPLASTY Right 08/27/2015   Procedure: RIGHT UNICOMPARTMENTAL KNEE ARTHROPLASTY;  Surgeon: Mcarthur Rossetti, MD;  Location: [facility];  Service: Orthopedics;  Laterality: Right;  . REVISION TOTAL KNEE ARTHROPLASTY Left 2008  . TOTAL KNEE ARTHROPLASTY Left 2003   Social History   Occupational History  .  Unemployed   Social History Main Topics  . Smoking status: Former Smoker    Packs/day: 1.00    Years: 5.00    Types: Cigarettes, Cigars    Quit date: 05/15/1968  . Smokeless tobacco: Never Used  . Alcohol use No  . Drug use: No  . Sexual activity: Yes

## 2016-04-14 DIAGNOSIS — H2513 Age-related nuclear cataract, bilateral: Secondary | ICD-10-CM | POA: Diagnosis not present

## 2016-04-14 DIAGNOSIS — E119 Type 2 diabetes mellitus without complications: Secondary | ICD-10-CM | POA: Diagnosis not present

## 2016-04-24 NOTE — Progress Notes (Signed)
HPI: FU CAD; cardiac catheterization in 2004 showed nonobstructive disease. His ejection fraction was 50% based on previous notes from Dr. Verl Blalock. Chest CT in November of 2013 showed scattered atherosclerotic calcifications in the aorta and coronary arteries. Stress echocardiogram in November of 2013 showed normal LV function and no ischemia. Abdominal ultrasound showed no aneurysm and no acute hepatobiliary findings. Nuclear study November 2015 showed an ejection fraction of 63% and no ischemia or infarction. Carotid Dopplers October 2017 showed minimal amount of plaque bilaterally. Since last seen patient has dyspnea with more extreme activities. No orthopnea or PND. Occasional brief pain in left chest area but no exertional chest pain. No syncope.   Current Outpatient Prescriptions  Medication Sig Dispense Refill  . amLODipine (NORVASC) 5 MG tablet Take 5 mg by mouth daily.    Marland Kitchen aspirin EC 81 MG tablet Take 81 mg by mouth daily.    . hydrochlorothiazide (HYDRODIURIL) 25 MG tablet TK 1 T PO D  4  . insulin aspart (NOVOLOG FLEXPEN) 100 UNIT/ML FlexPen Inject 10 Units into the skin 3 (three) times daily with meals.     . insulin glargine (LANTUS) 100 UNIT/ML injection Inject 16 Units into the skin every morning.     Marland Kitchen KRILL OIL 1000 MG CAPS Take 1,000 mg by mouth daily.    . Multiple Vitamin (MULTIVITAMIN WITH MINERALS) TABS Take 1 tablet by mouth daily.    . pantoprazole (PROTONIX) 40 MG tablet Take 1 tablet (40 mg total) by mouth daily. 30 tablet 0  . pravastatin (PRAVACHOL) 40 MG tablet TAKE 1 TABLET EVERY EVENING 90 tablet 2  . ramipril (ALTACE) 5 MG capsule Take 5 mg by mouth daily.    Marland Kitchen tiZANidine (ZANAFLEX) 4 MG tablet Take 1 tablet (4 mg total) by mouth every 6 (six) hours as needed for muscle spasms. 30 tablet 0  . vitamin C (ASCORBIC ACID) 500 MG tablet Take 500 mg by mouth daily.    . vitamin E 400 UNIT capsule Take 400 Units by mouth daily.     No current facility-administered  medications for this visit.      Past Medical History:  Diagnosis Date  . Anemia   . Arthritis    "all over"  . Bradycardia   . Chronic bronchitis ([place])    "get it just about q yr" (03/17/2014)  . Daily headache    "here lately" (03/17/2014); relates to sinuses  . GERD (gastroesophageal reflux disease)   . Hard of hearing    hearing aides bilat  . Hiatal hernia   . History of blood transfusion 2008   "related to OR"  . History of kidney stones   . Hyperlipidemia   . Hypertension   . Pneumonia 1972 X 1  . Type II diabetes mellitus ([place])   . Wears glasses     Past Surgical History:  Procedure Laterality Date  . broken finger      surgical repaired left hand 2nd finger   . cyst removed      per left knee/posteriorly  . CYSTECTOMY Left 2009   "behind knee"  . INGUINAL HERNIA REPAIR Right ?2010  . JOINT REPLACEMENT    . KNEE ARTHROSCOPY Bilateral 1980's  . PARTIAL KNEE ARTHROPLASTY Right 08/27/2015   Procedure: RIGHT UNICOMPARTMENTAL KNEE ARTHROPLASTY;  Surgeon: Mcarthur Rossetti, MD;  Location: [facility];  Service: Orthopedics;  Laterality: Right;  . REVISION TOTAL KNEE ARTHROPLASTY Left 2008  . TOTAL KNEE ARTHROPLASTY Left 2003  Social History   Social History  . Marital status: Married    Spouse name: Abe People  . Number of children: 3  . Years of education: College   Occupational History  .  Unemployed   Social History Main Topics  . Smoking status: Former Smoker    Packs/day: 1.00    Years: 5.00    Types: Cigarettes, Cigars    Quit date: 05/15/1968  . Smokeless tobacco: Never Used  . Alcohol use No  . Drug use: No  . Sexual activity: Yes   Other Topics Concern  . Not on file   Social History Narrative   Patient lives at home with family.   Caffeine Use: occasionally    Family History  Problem Relation Age of Onset  . Cancer Father     prostate  . Heart disease      No family history    ROS: no fevers or chills, productive cough,  hemoptysis, dysphasia, odynophagia, melena, hematochezia, dysuria, hematuria, rash, seizure activity, orthopnea, PND, pedal edema, claudication. Remaining systems are negative.  Physical Exam: Well-developed well-nourished in no acute distress.  Skin is warm and dry.  HEENT is normal.  Neck is supple.  Chest is clear to auscultation with normal expansion.  Cardiovascular exam is regular rate and rhythm.  Abdominal exam nontender or distended. No masses palpated. Extremities show no edema. neuro grossly intact  ECG-Marked sinus bradycardia at a rate of 46.  A/P  1 Coronary artery disease-continue aspirin.  2 hyperlipidemia-patient is complaining of significant myalgias and some joint swelling. I am not convinced this is related to statin but will hold Pravachol for 2 weeks. Check CK. If normal we will resume if his symptoms do not improve.  3 hypertension-blood pressure controlled. Continue present medications.  4 bradycardia-patient continues to have bradycardia. He is not symptomatic. Avoid AV nodal blocking agents. I have considered an exercise treadmill to demonstrate chronotropic competence but he cannot ambulate on a treadmill because of significant arthralgias.   Kirk Ruths, MD

## 2016-04-27 ENCOUNTER — Ambulatory Visit (INDEPENDENT_AMBULATORY_CARE_PROVIDER_SITE_OTHER): Payer: Medicare Other | Admitting: Cardiology

## 2016-04-27 ENCOUNTER — Encounter: Payer: Self-pay | Admitting: Cardiology

## 2016-04-27 VITALS — BP 124/66 | HR 46 | Ht 66.0 in | Wt 203.0 lb

## 2016-04-27 DIAGNOSIS — I2510 Atherosclerotic heart disease of native coronary artery without angina pectoris: Secondary | ICD-10-CM

## 2016-04-27 DIAGNOSIS — E7800 Pure hypercholesterolemia, unspecified: Secondary | ICD-10-CM | POA: Diagnosis not present

## 2016-04-27 NOTE — Patient Instructions (Signed)
Medication Instructions:   DO NOT TAKE PRAVASTATIN FOR 2 WEEKS= IF NO CHANGE IN SYMPTOMS RESTART= IF BETTER CALL AND LET us KNOW  Labwork:  Your physician recommends that you HAVE LAB WORK TODAY  Follow-Up:  Your physician wants you to follow-up in: Joel Jones will receive a reminder letter in the mail two months in advance. If you don't receive a letter, please call our office to schedule the follow-up appointment.   If you need a refill on your cardiac medications before your next appointment, please call your pharmacy.

## 2016-04-28 LAB — CK: Total CK: 86 U/L (ref 7–232)

## 2016-05-01 ENCOUNTER — Ambulatory Visit (INDEPENDENT_AMBULATORY_CARE_PROVIDER_SITE_OTHER): Payer: Medicare Other | Admitting: Physician Assistant

## 2016-05-01 ENCOUNTER — Encounter (INDEPENDENT_AMBULATORY_CARE_PROVIDER_SITE_OTHER): Payer: Self-pay | Admitting: Physician Assistant

## 2016-05-01 DIAGNOSIS — I2510 Atherosclerotic heart disease of native coronary artery without angina pectoris: Secondary | ICD-10-CM | POA: Diagnosis not present

## 2016-05-01 DIAGNOSIS — M7712 Lateral epicondylitis, left elbow: Secondary | ICD-10-CM

## 2016-05-01 DIAGNOSIS — M7989 Other specified soft tissue disorders: Secondary | ICD-10-CM | POA: Insufficient documentation

## 2016-05-01 NOTE — Progress Notes (Signed)
Office Visit Note   Patient: Joel Provost Laursen Sr.           Date of Birth: 1945/05/28           MRN: [id number] Visit Date: 05/01/2016              Requested by: [name], MD [phone], [place] [address] PCP: [name], MD   Assessment & Plan: Visit Diagnoses:  1. Lateral epicondylitis of left elbow     Plan:Stretching exercises shown, elevation and range of motion of the hand encouraged and  lifting techniques shown. Pins that simple given being applied is up to 2 g 3-4 times daily. He has upcoming right knee surgery will see him back postop for the knee and also discussed with him how the elbow is coming along.  Follow-Up Instructions: Return in about 2 weeks (around 05/15/2016) for has follow up after surgery.   Orders:  No orders of the defined types were placed in this encounter.  No orders of the defined types were placed in this encounter.     Procedures: No procedures performed   Clinical Data: No additional findings.   Subjective: Chief Complaint  Patient presents with  . Left Hand - Edema    HPI Joel Jones returns today with new complaint of left elbow pain. He has had no injury to the elbow. His pain began since last office visit. Notes swelling in hand pain mostly at the elbow.He notes swelling in his left hand also which is worse at the end of the day  Again he is diabetic poorly controlled. Review of Systems   Objective: Vital Signs: There were no vitals taken for this visit.  Physical Exam  Ortho Exam Elbow: Left elbow good range of motion. No rashes skin lesions ulcerations erythema of the elbow. He has tenderness over the lateral epicondyle region at the elbow. Radial pulse 2+. Hand with good sensation and full motor. No triggering of the fingers or tenderness with palpation throughout the hand. Good range of motion of the wrist without pain. Extension of the wrist against resistance causes pain lateral aspect of the elbow  Specialty  Comments:  No specialty comments available.  Imaging: No results found.   PMFS History: Patient Active Problem List   Diagnosis Date Noted  . Swelling of left hand 05/01/2016  . Osteoarthritis of right knee 08/27/2015  . S/P right unicompartmental knee replacement 08/27/2015  . Essential hypertension 05/11/2015  . Pain in the chest   . Right groin pain 08/15/2012  . Chest pain 04/02/2012  . Cerebrovascular disease 04/02/2012  . CAD (coronary artery disease) 04/02/2012  . Diabetes mellitus without complication ([place])   . Dizziness   . Sinus bradycardia   . Hyperlipidemia   . Edema    Past Medical History:  Diagnosis Date  . Anemia   . Arthritis    "all over"  . Bradycardia   . Chronic bronchitis ([place])    "get it just about q yr" (03/17/2014)  . Daily headache    "here lately" (03/17/2014); relates to sinuses  . GERD (gastroesophageal reflux disease)   . Hard of hearing    hearing aides bilat  . Hiatal hernia   . History of blood transfusion 2008   "related to OR"  . History of kidney stones   . Hyperlipidemia   . Hypertension   . Pneumonia 1972 X 1  . Type II diabetes mellitus ([place])   . Wears glasses  Family History  Problem Relation Age of Onset  . Cancer Father     prostate  . Heart disease      No family history    Past Surgical History:  Procedure Laterality Date  . broken finger      surgical repaired left hand 2nd finger   . cyst removed      per left knee/posteriorly  . CYSTECTOMY Left 2009   "behind knee"  . INGUINAL HERNIA REPAIR Right ?2010  . JOINT REPLACEMENT    . KNEE ARTHROSCOPY Bilateral 1980's  . PARTIAL KNEE ARTHROPLASTY Right 08/27/2015   Procedure: RIGHT UNICOMPARTMENTAL KNEE ARTHROPLASTY;  Surgeon: Mcarthur Rossetti, MD;  Location: [facility];  Service: Orthopedics;  Laterality: Right;  . REVISION TOTAL KNEE ARTHROPLASTY Left 2008  . TOTAL KNEE ARTHROPLASTY Left 2003   Social History   Occupational History  .  Unemployed    Social History Main Topics  . Smoking status: Former Smoker    Packs/day: 1.00    Years: 5.00    Types: Cigarettes, Cigars    Quit date: 05/15/1968  . Smokeless tobacco: Never Used  . Alcohol use No  . Drug use: No  . Sexual activity: Yes

## 2016-05-04 DIAGNOSIS — M23331 Other meniscus derangements, other medial meniscus, right knee: Secondary | ICD-10-CM | POA: Diagnosis not present

## 2016-05-04 DIAGNOSIS — M23361 Other meniscus derangements, other lateral meniscus, right knee: Secondary | ICD-10-CM | POA: Diagnosis not present

## 2016-05-04 DIAGNOSIS — M659 Synovitis and tenosynovitis, unspecified: Secondary | ICD-10-CM | POA: Diagnosis not present

## 2016-05-04 DIAGNOSIS — M94261 Chondromalacia, right knee: Secondary | ICD-10-CM | POA: Diagnosis not present

## 2016-05-04 DIAGNOSIS — M23061 Cystic meniscus, other lateral meniscus, right knee: Secondary | ICD-10-CM | POA: Diagnosis not present

## 2016-05-11 ENCOUNTER — Ambulatory Visit (INDEPENDENT_AMBULATORY_CARE_PROVIDER_SITE_OTHER): Payer: Medicare Other | Admitting: Orthopaedic Surgery

## 2016-05-11 DIAGNOSIS — Z96651 Presence of right artificial knee joint: Secondary | ICD-10-CM

## 2016-05-11 DIAGNOSIS — Z9889 Other specified postprocedural states: Secondary | ICD-10-CM

## 2016-05-11 NOTE — Progress Notes (Signed)
The patient is one-week status post a right knee arthroscopy. He has a partial knee replacement on the medial compartment of his knee and that side looked good his left side though has moderate arthritis and a meniscal tear we are debride this back.  On examination of his knee today and remove this sutures. He has good range of motion of his knee. He has some moderate swelling to be expected. We did talk about what we did for surgery as well.  This point I do feel he is a candidate for hyaluronic acid in the right knee to deal with the patellofemoral and lateral compartment arthritic changes. This is not contraindicated with partial knee replacement. We'll see him back in 4 weeks' no focal have hyaluronic acid approve for that right knee. The only option down the road would be converting to a total knee replacement

## 2016-05-16 ENCOUNTER — Encounter (HOSPITAL_COMMUNITY): Payer: Self-pay | Admitting: Emergency Medicine

## 2016-05-16 ENCOUNTER — Emergency Department (HOSPITAL_COMMUNITY)
Admission: EM | Admit: 2016-05-16 | Discharge: 2016-05-16 | Disposition: A | Discharge status: Home / Self Care | Payer: PPO | Attending: Emergency Medicine | Admitting: Emergency Medicine

## 2016-05-16 DIAGNOSIS — Z7982 Long term (current) use of aspirin: Secondary | ICD-10-CM | POA: Diagnosis not present

## 2016-05-16 DIAGNOSIS — Z79899 Other long term (current) drug therapy: Secondary | ICD-10-CM | POA: Diagnosis not present

## 2016-05-16 DIAGNOSIS — M25512 Pain in left shoulder: Secondary | ICD-10-CM | POA: Insufficient documentation

## 2016-05-16 DIAGNOSIS — I10 Essential (primary) hypertension: Secondary | ICD-10-CM | POA: Diagnosis not present

## 2016-05-16 DIAGNOSIS — M25531 Pain in right wrist: Secondary | ICD-10-CM | POA: Diagnosis not present

## 2016-05-16 DIAGNOSIS — Z87891 Personal history of nicotine dependence: Secondary | ICD-10-CM | POA: Insufficient documentation

## 2016-05-16 DIAGNOSIS — E119 Type 2 diabetes mellitus without complications: Secondary | ICD-10-CM | POA: Insufficient documentation

## 2016-05-16 DIAGNOSIS — I2510 Atherosclerotic heart disease of native coronary artery without angina pectoris: Secondary | ICD-10-CM | POA: Diagnosis not present

## 2016-05-16 DIAGNOSIS — M25532 Pain in left wrist: Secondary | ICD-10-CM | POA: Diagnosis not present

## 2016-05-16 DIAGNOSIS — M25511 Pain in right shoulder: Secondary | ICD-10-CM | POA: Diagnosis not present

## 2016-05-16 DIAGNOSIS — Z794 Long term (current) use of insulin: Secondary | ICD-10-CM | POA: Diagnosis not present

## 2016-05-16 DIAGNOSIS — Z96653 Presence of artificial knee joint, bilateral: Secondary | ICD-10-CM | POA: Insufficient documentation

## 2016-05-16 DIAGNOSIS — M2550 Pain in unspecified joint: Secondary | ICD-10-CM

## 2016-05-16 LAB — CBC WITH DIFFERENTIAL/PLATELET
Basophils Absolute: 0 10*3/uL (ref 0.0–0.1)
Basophils Relative: 1 %
Eosinophils Absolute: 0.1 10*3/uL (ref 0.0–0.7)
Eosinophils Relative: 2 %
HCT: 38.9 % — ABNORMAL LOW (ref 39.0–52.0)
Hemoglobin: 13.8 g/dL (ref 13.0–17.0)
Lymphocytes Relative: 21 %
Lymphs Abs: 1.7 10*3/uL (ref 0.7–4.0)
MCH: 30.7 pg (ref 26.0–34.0)
MCHC: 35.5 g/dL (ref 30.0–36.0)
MCV: 86.6 fL (ref 78.0–100.0)
Monocytes Absolute: 0.9 10*3/uL (ref 0.1–1.0)
Monocytes Relative: 11 %
Neutro Abs: 5.4 10*3/uL (ref 1.7–7.7)
Neutrophils Relative %: 65 %
Platelets: 353 10*3/uL (ref 150–400)
RBC: 4.49 MIL/uL (ref 4.22–5.81)
RDW: 13.2 % (ref 11.5–15.5)
WBC: 8.2 10*3/uL (ref 4.0–10.5)

## 2016-05-16 LAB — BASIC METABOLIC PANEL
Anion gap: 9 (ref 5–15)
BUN: 18 mg/dL (ref 6–20)
CO2: 25 mmol/L (ref 22–32)
Calcium: 9 mg/dL (ref 8.9–10.3)
Chloride: 101 mmol/L (ref 101–111)
Creatinine, Ser: 0.97 mg/dL (ref 0.61–1.24)
GFR calc Af Amer: >60 mL/min (ref >60)
GFR calc non Af Amer: >60 mL/min (ref >60)
Glucose, Bld: 189 mg/dL — ABNORMAL HIGH (ref 65–99)
Potassium: 3.8 mmol/L (ref 3.5–5.1)
Sodium: 135 mmol/L (ref 135–145)

## 2016-05-16 LAB — SEDIMENTATION RATE: Sed Rate: 26 mm/hr — ABNORMAL HIGH (ref 0–16)

## 2016-05-16 LAB — C-REACTIVE PROTEIN: CRP: 3 mg/dL — ABNORMAL HIGH (ref <1.0)

## 2016-05-16 MED ORDER — LORAZEPAM 2 MG/ML IJ SOLN
0.5000 mg | Freq: Once | INTRAMUSCULAR | Status: AC
  Administered 2016-05-16: 0.5 mg via INTRAVENOUS
  Filled 2016-05-16: qty 1

## 2016-05-16 MED ORDER — TRAMADOL HCL 50 MG PO TABS: 50.0000 mg | ORAL_TABLET | Freq: Four times a day (QID) | ORAL | 0 refills | Status: DC | PRN

## 2016-05-16 MED ORDER — KETOROLAC TROMETHAMINE 15 MG/ML IJ SOLN: 15.0000 mg | Freq: Once | INTRAMUSCULAR | Status: DC

## 2016-05-16 MED ORDER — KETOROLAC TROMETHAMINE 15 MG/ML IJ SOLN
15.0000 mg | Freq: Once | INTRAMUSCULAR | Status: AC
  Administered 2016-05-16: 15 mg via INTRAVENOUS
  Filled 2016-05-16: qty 1

## 2016-05-16 MED ORDER — HYDROMORPHONE HCL 1 MG/ML IJ SOLN
1.0000 mg | Freq: Once | INTRAMUSCULAR | Status: AC
  Administered 2016-05-16: 1 mg via INTRAVENOUS
  Filled 2016-05-16: qty 1

## 2016-05-16 MED ORDER — PREDNISONE 20 MG PO TABS: 40.0000 mg | ORAL_TABLET | Freq: Every day | ORAL | 0 refills | Status: DC

## 2016-05-16 MED ORDER — DEXAMETHASONE SODIUM PHOSPHATE 10 MG/ML IJ SOLN
10.0000 mg | Freq: Once | INTRAMUSCULAR | Status: AC
  Administered 2016-05-16: 10 mg via INTRAVENOUS
  Filled 2016-05-16: qty 1

## 2016-05-16 MED ORDER — DEXAMETHASONE SODIUM PHOSPHATE 10 MG/ML IJ SOLN: 10.0000 mg | Freq: Once | INTRAMUSCULAR | Status: DC

## 2016-05-16 MED ORDER — HYDROMORPHONE HCL 1 MG/ML IJ SOLN: 1.0000 mg | Freq: Once | INTRAMUSCULAR | Status: DC

## 2016-05-16 MED ORDER — SODIUM CHLORIDE 0.9 % IV BOLUS (SEPSIS)
1000.0000 mL | Freq: Once | INTRAVENOUS | Status: AC
  Administered 2016-05-16: 1000 mL via INTRAVENOUS

## 2016-05-16 MED ORDER — SODIUM CHLORIDE 0.9 % IV BOLUS (SEPSIS): 500.0000 mL | Freq: Once | INTRAVENOUS | Status: DC

## 2016-05-16 NOTE — ED Provider Notes (Signed)
[facility] DEPT Provider Note   CSN: [id number] Arrival date & time: 05/16/16  [id number]  By signing my name below, I, Joel Jones, attest that this documentation has been prepared under the direction and in the presence of Virgel Manifold, MD. Electronically Signed: Sonum Jones, Education administrator. 05/16/16. 10:40 AM.  History   Chief Complaint Chief Complaint  Patient presents with  . Arm Pain    The history is provided by the patient and the spouse. No language interpreter was used.     HPI Comments: Joel Donaghey Pawloski Sr. is a 71 y.o. male who presents to the Emergency Department complaining of BUE pain for the past few weeks that worsened this morning. Joel Jones states the pain initially began to the left arm and now has gradually began in the right arm. Joel Jones reports pain with ranging the shoulder and hands. Joel Jones also has associated swelling to the same areas. Joel Jones has been seen by Dr. Ninfa Linden who has administered Cortizone injections but this did not help his symptoms. Joel Jones also complains of a knot to the bottom of the left foot. Joel Jones was recently prescribed meloxicam without relief. Joel Jones denies numbness, weakness, pain in any other joints. Joel Jones denies history of rheumatoid arthritis.   Past Medical History:  Diagnosis Date  . Anemia   . Arthritis    "all over"  . Bradycardia   . Chronic bronchitis ([place])    "get it just about q yr" (03/17/2014)  . Daily headache    "here lately" (03/17/2014); relates to sinuses  . GERD (gastroesophageal reflux disease)   . Hard of hearing    hearing aides bilat  . Hiatal hernia   . History of blood transfusion 2008   "related to OR"  . History of kidney stones   . Hyperlipidemia   . Hypertension   . Pneumonia 1972 X 1  . Type II diabetes mellitus ([place])   . Wears glasses     Patient Active Problem List   Diagnosis Date Noted  . Swelling of left hand 05/01/2016  . Osteoarthritis of right knee 08/27/2015  . S/P right unicompartmental knee replacement 08/27/2015  . Essential  hypertension 05/11/2015  . Pain in the chest   . Right groin pain 08/15/2012  . Chest pain 04/02/2012  . Cerebrovascular disease 04/02/2012  . CAD (coronary artery disease) 04/02/2012  . Diabetes mellitus without complication ([place])   . Dizziness   . Sinus bradycardia   . Hyperlipidemia   . Edema     Past Surgical History:  Procedure Laterality Date  . broken finger      surgical repaired left hand 2nd finger   . cyst removed      per left knee/posteriorly  . CYSTECTOMY Left 2009   "behind knee"  . INGUINAL HERNIA REPAIR Right ?2010  . JOINT REPLACEMENT    . KNEE ARTHROSCOPY Bilateral 1980's  . PARTIAL KNEE ARTHROPLASTY Right 08/27/2015   Procedure: RIGHT UNICOMPARTMENTAL KNEE ARTHROPLASTY;  Surgeon: Mcarthur Rossetti, MD;  Location: [facility];  Service: Orthopedics;  Laterality: Right;  . REVISION TOTAL KNEE ARTHROPLASTY Left 2008  . TOTAL KNEE ARTHROPLASTY Left 2003       Home Medications    Prior to Admission medications   Medication Sig Start Date End Date Taking? Authorizing Provider  amLODipine (NORVASC) 5 MG tablet Take 5 mg by mouth daily.   Yes Historical Provider, MD  aspirin EC 81 MG tablet Take 81 mg by mouth daily.   Yes Historical Provider, MD  hydrochlorothiazide (  HYDRODIURIL) 25 MG tablet take 25mg  by mouth daily 03/02/16  Yes Historical Provider, MD  insulin aspart (NOVOLOG FLEXPEN) 100 UNIT/ML FlexPen Inject 10 Units into the skin 3 (three) times daily with meals. pump   Yes Historical Provider, MD  insulin glargine (LANTUS) 100 UNIT/ML injection Inject 16 Units into the skin every morning.    Yes Historical Provider, MD  KRILL OIL 1000 MG CAPS Take 1,000 mg by mouth daily.   Yes Historical Provider, MD  Multiple Vitamin (MULTIVITAMIN WITH MINERALS) TABS Take 1 tablet by mouth daily.   Yes Historical Provider, MD  pantoprazole (PROTONIX) 40 MG tablet Take 1 tablet (40 mg total) by mouth daily. 03/19/14  Yes Modena Jansky, MD  pravastatin (PRAVACHOL) 40  MG tablet TAKE 1 TABLET EVERY EVENING 11/20/14  Yes Lelon Perla, MD  ramipril (ALTACE) 5 MG capsule Take 5 mg by mouth daily.   Yes Historical Provider, MD  vitamin C (ASCORBIC ACID) 500 MG tablet Take 500 mg by mouth daily.   Yes Historical Provider, MD  vitamin E 400 UNIT capsule Take 400 Units by mouth daily.   Yes Historical Provider, MD  tiZANidine (ZANAFLEX) 4 MG tablet Take 1 tablet (4 mg total) by mouth every 6 (six) hours as needed for muscle spasms. Patient not taking: Reported on 05/16/2016 08/28/15   Mcarthur Rossetti, MD    Family History Family History  Problem Relation Age of Onset  . Cancer Father     prostate  . Heart disease      No family history    Social History Social History  Substance Use Topics  . Smoking status: Former Smoker    Packs/day: 1.00    Years: 5.00    Types: Cigarettes, Cigars    Quit date: 05/15/1968  . Smokeless tobacco: Never Used  . Alcohol use No     Allergies   Invokana [canagliflozin]; Lipitor [atorvastatin]; Losartan; Vicodin [hydrocodone-acetaminophen]; Codeine; and Roxicodone [oxycodone]   Review of Systems Review of Systems A complete 10 system review of systems was obtained and all systems are negative except as noted in the HPI and PMH.    Physical Exam Updated Vital Signs There were no vitals taken for this visit.  Physical Exam  Constitutional: Joel Jones is oriented to person, place, and time. Joel Jones appears well-developed and well-nourished.  HENT:  Head: Normocephalic and atraumatic.  Eyes: EOM are normal.  Neck: Normal range of motion.  Cardiovascular: Normal rate, regular rhythm, normal heart sounds and intact distal pulses.   Pulmonary/Chest: Effort normal and breath sounds normal. No respiratory distress.  Abdominal: Soft. Joel Jones exhibits no distension. There is no tenderness.  Musculoskeletal: Joel Jones exhibits tenderness.  Significant pain with ROM of bilateral shoulders and wrists, left worse than right. Mild diffuse  swelling in left hand. No redness, no rash. No point tenderness.   Neurological: Joel Jones is alert and oriented to person, place, and time.  Skin: Skin is warm and dry. No rash noted. No erythema.  Psychiatric: Joel Jones has a normal mood and affect. Judgment normal.  Nursing note and vitals reviewed.    ED Treatments / Results  DIAGNOSTIC STUDIES:  COORDINATION OF CARE: 10:42 AM Discussed treatment plan with pt at bedside and pt agreed to plan.   Labs (all labs ordered are listed, but only abnormal results are displayed) Labs Reviewed - No data to display  EKG  EKG Interpretation None       Radiology No results found.  Procedures Procedures (including critical care  time)  Medications Ordered in ED Medications - No data to display   Initial Impression / Assessment and Plan / ED Course  I have reviewed the triage vital signs and the nursing notes.  Pertinent labs & imaging results that were available during my care of the patient were reviewed by me and considered in my medical decision making (see chart for details).  Clinical Course     70yM with polyarthralgia. Suspect this is some type of rheumatologic condition such as RA with multiple joint involvement. Highly doubt infectious. Will give a course of steroids. PRN pain medications. Additional blood work ordered to potentially help follow-up provider(s) but pt advised these wouldn't be resulted today.   It has been determined that no acute conditions requiring further emergency intervention are present at this time. The patient has been advised of the diagnosis and plan. I reviewed any labs and imaging including any potential incidental findings. We have discussed signs and symptoms that warrant return to the ED and they are listed in the discharge instructions.    Final Clinical Impressions(s) / ED Diagnoses   Final diagnoses:  Polyarthralgia    New Prescriptions Discharge Medication List as of 05/16/2016  2:58 PM      START taking these medications   Details  predniSONE (DELTASONE) 20 MG tablet Take 2 tablets (40 mg total) by mouth daily., Starting Tue 05/16/2016, Print    traMADol (ULTRAM) 50 MG tablet Take 1 tablet (50 mg total) by mouth every 6 (six) hours as needed., Starting Tue 05/16/2016, Print       I personally preformed the services scribed in my presence. The recorded information has been reviewed is accurate. Virgel Manifold, MD.    Virgel Manifold, MD 05/23/16 [phone]

## 2016-05-16 NOTE — ED Triage Notes (Signed)
Per pt, states left shoulder pain radiating down arm-states now pain in right arm, hand-states saw PCP and stated left shoulder showed some arthritis-states received Cortizone shot-states symptoms got worse-saw PCP again last Thursday and given meloxicam-states has not helped

## 2016-05-17 LAB — RHEUMATOID FACTOR: Rhuematoid fact SerPl-aCnc: 156.6 IU/mL — ABNORMAL HIGH (ref 0.0–13.9)

## 2016-05-17 LAB — ANTINUCLEAR ANTIBODIES, IFA: ANTINUCLEAR ANTIBODIES, IFA: NEGATIVE

## 2016-05-17 LAB — CYCLIC CITRUL PEPTIDE ANTIBODY, IGG/IGA: CCP Antibodies IgG/IgA: >250 units — ABNORMAL HIGH (ref 0–19)

## 2016-05-18 ENCOUNTER — Telehealth (INDEPENDENT_AMBULATORY_CARE_PROVIDER_SITE_OTHER): Payer: Self-pay

## 2016-05-18 NOTE — Telephone Encounter (Signed)
Patient was in the ER Tuesday for bilateral upper extremity pain and swelling. They drew labs and told him possible rheumatologic issues. Wondering if we could access labs since they wouldn't tell her anything. He is having pain at base of skull and neck. He is on a medrol dose pak and taking tramadol. kinda wanting to know were to go from here. Make appt with [place] or rheumatologist

## 2016-05-18 NOTE — Telephone Encounter (Signed)
His rheumatoid factor was way elevated on his labs.  He needs a rheumatology appointment soon.

## 2016-05-22 ENCOUNTER — Other Ambulatory Visit (INDEPENDENT_AMBULATORY_CARE_PROVIDER_SITE_OTHER): Payer: Self-pay

## 2016-05-22 DIAGNOSIS — M2550 Pain in unspecified joint: Secondary | ICD-10-CM

## 2016-05-22 NOTE — Telephone Encounter (Signed)
Sent to scheduling to schedule him

## 2016-05-23 NOTE — Progress Notes (Signed)
Office Visit Note  Patient: Joel Hoback Matheson Sr.             Date of Birth: Aug 13, 1945           MRN: [id number]             PCP: [name], MD Referring: [name], MD Visit Date: 05/24/2016 Occupation: Retired, Architect    Subjective:  Pain joints   History of Present Illness: Joel Hanway Pazos Sr. is a 71 y.o. male seen in consultation per request of Dr. Rush Farmer. According to patient about a month ago he started having left shoulder joint pain and difficulty raising his left shoulder. He was seen by Gordy Levan, Dr. Pryor Montes PA who gave him a cortisone injection in his shoulder. He states the shoulder joint pain got better but the pain moved to his bilateral elbows bilateral wrist joints and bilateral hands. The patient was initially in his left side and then moved to the right side. He went back to [place] and he was given some exercises which did not help his symptoms. He reports last Monday he went to emergency room where he was given IV Decadron, Dilaudid, Toradol and Ativan for severe pain. He was also discharged on prednisone 40 mg a day. Patient took only 20 mg by mouth daily and ran out of the medication about 2 days ago. He states the prednisone helped his symptoms but raises blood sugar. He's been having some discomfort in his knee joints he has left total knee replacement and right partial knee replacement. He has some stiffness in his neck. None of the other joints are painful.  Activities of Daily Living:  Patient reports morning stiffness for all day hours.   Patient Reports nocturnal pain.  Difficulty dressing/grooming: Reports Difficulty climbing stairs: Reports Difficulty getting out of chair: Denies Difficulty using hands for taps, buttons, cutlery, and/or writing: Reports   Review of Systems  Constitutional: Positive for fatigue. Negative for night sweats and weakness ( ).  HENT: Positive for mouth dryness. Negative for mouth sores and nose dryness.   Eyes: Positive for  dryness. Negative for redness.  Respiratory: Negative for shortness of breath and difficulty breathing.   Cardiovascular: Negative for chest pain, palpitations, hypertension, irregular heartbeat and swelling in legs/feet.  Gastrointestinal: Negative for constipation and diarrhea.  Endocrine: Negative for increased urination.  Musculoskeletal: Positive for arthralgias, joint pain, joint swelling, myalgias, morning stiffness and myalgias. Negative for muscle weakness and muscle tenderness.  Skin: Negative for color change, rash, hair loss, nodules/bumps, skin tightness, ulcers and sensitivity to sunlight.  Allergic/Immunologic: Negative for susceptible to infections.  Neurological: Negative for dizziness, fainting, memory loss and night sweats.  Hematological: Negative for swollen glands.  Psychiatric/Behavioral: Positive for sleep disturbance. Negative for depressed mood. The patient is not nervous/anxious.     PMFS History:  Patient Active Problem List   Diagnosis Date Noted  . Swelling of left hand 05/01/2016  . Osteoarthritis of right knee 08/27/2015  . S/P right unicompartmental knee replacement 08/27/2015  . Essential hypertension 05/11/2015  . Pain in the chest   . Right groin pain 08/15/2012  . Chest pain 04/02/2012  . Cerebrovascular disease 04/02/2012  . CAD (coronary artery disease) 04/02/2012  . Diabetes mellitus without complication ([place])   . Dizziness   . Sinus bradycardia   . Hyperlipidemia   . Edema     Past Medical History:  Diagnosis Date  . Anemia   . Arthritis    "all over"  .  Bradycardia   . Chronic bronchitis ([place])    "get it just about q yr" (03/17/2014)  . Daily headache    "here lately" (03/17/2014); relates to sinuses  . GERD (gastroesophageal reflux disease)   . Hard of hearing    hearing aides bilat  . Hiatal hernia   . History of blood transfusion 2008   "related to OR"  . History of kidney stones   . Hyperlipidemia   . Hypertension   .  Pneumonia 1972 X 1  . Type II diabetes mellitus ([place])   . Wears glasses     Family History  Problem Relation Age of Onset  . Cancer Father     prostate  . Heart disease      No family history   Past Surgical History:  Procedure Laterality Date  . broken finger      surgical repaired left hand 2nd finger   . cyst removed      per left knee/posteriorly  . CYSTECTOMY Left 2009   "behind knee"  . INGUINAL HERNIA REPAIR Right ?2010  . JOINT REPLACEMENT    . KNEE ARTHROSCOPY Bilateral 1980's  . PARTIAL KNEE ARTHROPLASTY Right 08/27/2015   Procedure: RIGHT UNICOMPARTMENTAL KNEE ARTHROPLASTY;  Surgeon: Mcarthur Rossetti, MD;  Location: [facility];  Service: Orthopedics;  Laterality: Right;  . REVISION TOTAL KNEE ARTHROPLASTY Left 2008  . TOTAL KNEE ARTHROPLASTY Left 2003   Social History   Social History Narrative   Patient lives at home with family.   Caffeine Use: occasionally     Objective: Vital Signs: BP 132/76   Pulse (!) 50   Resp 16   Ht '5\' 6"'  (1.676 m)   Wt 198 lb (89.8 kg)   BMI 31.96 kg/m    Physical Exam  Constitutional: He is oriented to person, place, and time. He appears well-developed and well-nourished.  HENT:  Head: Normocephalic and atraumatic.  Eyes: Conjunctivae and EOM are normal. Pupils are equal, round, and reactive to light.  Neck: Normal range of motion. Neck supple.  Cardiovascular: Normal rate, regular rhythm and normal heart sounds.   Bradycardia heart rate in 50s  Pulmonary/Chest: Effort normal and breath sounds normal.  Abdominal: Soft. Bowel sounds are normal.  Neurological: He is alert and oriented to person, place, and time.  Skin: Skin is warm and dry. Capillary refill takes less than 2 seconds.  Psychiatric: He has a normal mood and affect. His behavior is normal.  Nursing note and vitals reviewed.    Musculoskeletal Exam: C-spine some limitation with the left lateral rotation and discomfort thoracic and lumbar spine good range  of motion. Left shoulder joint painful range of motion He has a 20 contracture in his left elbow which was tender to palpate. Right wrist joint extension was 40 and flexion 65 left wrist joint extension 55 and flexion 65. He had tenderness on palpation of all of his MCP joints and PIP joints. He has some synovitis over bilateral first and second MCP joints and second and third PIP joints. He had DIP PIP thickening as well. Hip joints afford range of motion knee joints ankle joints MTPs PIPs were full range of motion. He has right partial knee replacement with warmth swelling and effusion. Left total knee replacement without any warmth or swelling. No ankle joint swelling or synovitis was noted he has tenderness on palpation over his left first MTP joint. A nodule was present over left elbow and on the plantar surface of left first MTP.  CDAI Exam: CDAI Homunculus Exam:   Tenderness:  LUE: glenohumeral and ulnohumeral and radiohumeral Right hand: 1st MCP, 2nd MCP, 3rd MCP, 4th MCP, 5th MCP, 2nd PIP, 3rd PIP, 4th PIP and 5th PIP Left hand: 1st MCP, 2nd MCP, 3rd MCP, 4th MCP, 5th MCP, 2nd PIP, 3rd PIP and 4th PIP RLE: tibiofemoral Left foot: 1st MTP  Swelling:  Right hand: 2nd MCP, 3rd MCP, 2nd PIP and 3rd PIP Left hand: 2nd MCP, 3rd MCP, 2nd PIP and 3rd PIP RLE: tibiofemoral  Joint Counts:  CDAI Tender Joint count: 20 CDAI Swollen Joint count: 9  Global Assessments:  Patient Global Assessment: 5 Provider Global Assessment: 5  CDAI Calculated Score: 39    Investigation: Findings:  05/16/2016 rheumatid factor 156.6, CCP>250, ANA negative, ESR 26, CRP 3.0, CBC normal, BMP normal 04/27/2016 CK 86 Patient had a normal chest x-ray in 2015.   Imaging: Xr Foot 2 Views Left  Result Date: 05/24/2016 PIP/DIP narrowing, no MTP narrowing no erosive changes. Impression: These findings are consistent with osteoarthritis  Xr Foot 2 Views Right  Result Date: 05/24/2016 PIP/DIP narrowing,  no MTP joint narrowing no erosive changes. Impression: These findings are consistent with osteoarthritis  Xr Hand 2 View Left  Result Date: 05/24/2016 All PIP/DIP and CMC narrowing. No MCP joint narrowing no intercarpal joint space narrowing no erosive changes. These findings are consistent with osteoarthritis.  Xr Hand 2 View Right  Result Date: 05/24/2016 Right second MCP joint narrowing. All PIP/DIP narrowing. CMC joint narrowing. No erosive changes. Impression: These findings are consistent with osteoarthritis and rheumatoid arthritis overlap.   Speciality Comments: No specialty comments available.    Procedures:  No procedures performed Allergies: Invokana [canagliflozin]; Lipitor [atorvastatin]; Losartan; Vicodin [hydrocodone-acetaminophen]; Codeine; and Roxicodone [oxycodone]   Assessment / Plan:     Visit Diagnoses: Rheumatoid arthritis involving multiple sites with positive rheumatoid factor (HCC) - Positive rheumatoid factor, positive CCP, elevated sedimentation rate -he has severe pain and discomfort in multiple joints. He has positive synovitis in his bilateral MCPs and PIP joints painful range of motion of his left shoulder joint. Left elbow joint contracture. Right knee joint effusion. And discomfort in his MTPs. He had good response to prednisone 10 mg a day but it caused elevation of his blood glucose. We had detailed discussion regarding rheumatoid arthritis area different treatment options and their side effects were discussed at length. I will obtain the following baseline x-rays and also some labs in preparation to start him on immunosuppressive therapy. He will also need pneumococcal vaccine a chest x-ray, and zoster vaccine would be advised. Plan: XR Hand 2 View Left, XR Hand 2 View Right, XR Foot 2 Views Left, XR Foot 2 Views Right. X-ray did not show any erosive changes. I handout was given on methotrexate and consent was taken once we have labs available will be able to  start him on methotrexate along with folic acid. I will also give prednisone as a bridging therapy he was taking 20 mg by mouth daily which was causing elevation of his blood sugar. I'll start him on 10 mg by mouth daily for 2 weeks and then taper by 2.5 mg every 2 weeks.  Primary osteoarthritis of right knee - Status post right partial knee replacement. He had left total knee replacement which appears to be doing well.  Diabetes mellitus without complication ([place]) he is on insulin and with prednisone he's had problems with elevated glucose.  His other medical problems are as follows:  Sinus  bradycardia  Mixed hyperlipidemia  Cerebrovascular disease  Coronary artery disease due to lipid rich plaque  Essential hypertension  Screening for tuberculosis - Plan: DG Chest 2 View    Orders: Orders Placed This Encounter  Procedures  . DG Chest 2 View  . XR Hand 2 View Left  . XR Hand 2 View Right  . XR Foot 2 Views Left  . XR Foot 2 Views Right  . COMPLETE METABOLIC PANEL WITH GFR  . Urinalysis, Routine w reflex microscopic  . Hepatitis A antibody, IgM  . Hepatitis B Core Antibody, IgM  . Hepatitis C Antibody  . Quantiferon tb gold assay (blood)  . Serum protein electrophoresis with reflex  . IgG, IgA, IgM  . HIV antibody  . Hepatitis B Surface AntiGEN  . TSH   No orders of the defined types were placed in this encounter.   Face-to-face time spent with patient was 60 minutes. 50% of time was spent in counseling and coordination of care.  Follow-Up Instructions: Return for Rheumatoid arthritis.   Bo Merino, MD  Note - This record has been created using Editor, commissioning.  Chart creation errors have been sought, but Diskin not always  have been located. Such creation errors do not reflect on  the standard of medical care.

## 2016-05-24 ENCOUNTER — Ambulatory Visit (INDEPENDENT_AMBULATORY_CARE_PROVIDER_SITE_OTHER): Payer: PPO | Admitting: Rheumatology

## 2016-05-24 ENCOUNTER — Ambulatory Visit (INDEPENDENT_AMBULATORY_CARE_PROVIDER_SITE_OTHER): Payer: PPO

## 2016-05-24 ENCOUNTER — Encounter: Payer: Self-pay | Admitting: Rheumatology

## 2016-05-24 VITALS — BP 132/76 | HR 50 | Resp 16 | Ht 66.0 in | Wt 198.0 lb

## 2016-05-24 DIAGNOSIS — I679 Cerebrovascular disease, unspecified: Secondary | ICD-10-CM | POA: Diagnosis not present

## 2016-05-24 DIAGNOSIS — I2510 Atherosclerotic heart disease of native coronary artery without angina pectoris: Secondary | ICD-10-CM | POA: Diagnosis not present

## 2016-05-24 DIAGNOSIS — I2583 Coronary atherosclerosis due to lipid rich plaque: Secondary | ICD-10-CM

## 2016-05-24 DIAGNOSIS — Z114 Encounter for screening for human immunodeficiency virus [HIV]: Secondary | ICD-10-CM

## 2016-05-24 DIAGNOSIS — Z1159 Encounter for screening for other viral diseases: Secondary | ICD-10-CM

## 2016-05-24 DIAGNOSIS — M1711 Unilateral primary osteoarthritis, right knee: Secondary | ICD-10-CM | POA: Diagnosis not present

## 2016-05-24 DIAGNOSIS — I10 Essential (primary) hypertension: Secondary | ICD-10-CM

## 2016-05-24 DIAGNOSIS — Z111 Encounter for screening for respiratory tuberculosis: Secondary | ICD-10-CM | POA: Diagnosis not present

## 2016-05-24 DIAGNOSIS — E119 Type 2 diabetes mellitus without complications: Secondary | ICD-10-CM | POA: Diagnosis not present

## 2016-05-24 DIAGNOSIS — M2550 Pain in unspecified joint: Secondary | ICD-10-CM | POA: Diagnosis not present

## 2016-05-24 DIAGNOSIS — R5383 Other fatigue: Secondary | ICD-10-CM

## 2016-05-24 DIAGNOSIS — E782 Mixed hyperlipidemia: Secondary | ICD-10-CM | POA: Diagnosis not present

## 2016-05-24 DIAGNOSIS — M0579 Rheumatoid arthritis with rheumatoid factor of multiple sites without organ or systems involvement: Secondary | ICD-10-CM | POA: Diagnosis not present

## 2016-05-24 LAB — TSH: TSH: 3.03 mIU/L (ref 0.40–4.50)

## 2016-05-24 LAB — HIV ANTIBODY (ROUTINE TESTING W REFLEX): HIV 1&2 Ab, 4th Generation: NONREACTIVE

## 2016-05-24 MED ORDER — PREDNISONE 5 MG PO TABS: ORAL_TABLET | ORAL | 0 refills | Status: DC

## 2016-05-24 NOTE — Progress Notes (Signed)
Pharmacy Note  Subjective: Patient presents today to the [facility] to see Dr. Estanislado Pandy.  Patient seen by the pharmacist for counseling on methotrexate.    Objective: CBC    Component Value Date/Time   WBC 8.2 05/16/2016 1107   RBC 4.49 05/16/2016 1107   HGB 13.8 05/16/2016 1107   HCT 38.9 (L) 05/16/2016 1107   PLT 353 05/16/2016 1107   MCV 86.6 05/16/2016 1107   MCH 30.7 05/16/2016 1107   MCHC 35.5 05/16/2016 1107   RDW 13.2 05/16/2016 1107   LYMPHSABS 1.7 05/16/2016 1107   MONOABS 0.9 05/16/2016 1107   EOSABS 0.1 05/16/2016 1107   BASOSABS 0.0 05/16/2016 1107   CMP     Component Value Date/Time   NA 135 05/16/2016 1107   K 3.8 05/16/2016 1107   CL 101 05/16/2016 1107   CO2 25 05/16/2016 1107   GLUCOSE 189 (H) 05/16/2016 1107   BUN 18 05/16/2016 1107   CREATININE 0.97 05/16/2016 1107   CREATININE 1.10 08/19/2012 0921   CALCIUM 9.0 05/16/2016 1107   PROT 6.7 05/14/2012 0735   ALBUMIN 4.0 05/14/2012 0735   AST 21 05/14/2012 0735   ALT 22 05/14/2012 0735   ALKPHOS 59 05/14/2012 0735   BILITOT 0.7 05/14/2012 0735   GFRNONAA >60 05/16/2016 1107   GFRAA >60 05/16/2016 1107    TB Gold: ordered today Hepatitis panel: ordered today HIV: ordered today  Chest-xray:  03/18/2014: "IMPRESSION: There is mild pulmonary hypoinflation. There is no acute cardiopulmonary abnormality."    Assessment/Plan:  Patient was counseled on the purpose, proper use, and adverse effects of methotrexate including nausea, infection, and signs and symptoms of pneumonitis.  Reviewed instructions with patient to take methotrexate weekly along with folic acid daily.  Discussed the importance of frequent monitoring of kidney and liver function and blood counts.  Counseled patient to avoid sulfa antibiotics such as Bactrim or Septra while on methotrexate.  Provided patient with educational materials on methotrexate and answered all questions.  Advised patient to get annual influenza  vaccine and to get a pneumococcal vaccine and shingles vaccine if patient has not already had one.  Patient confirms he has already had the PCV13 vaccine, but has never had an influenza vaccine.  Recommended yearly influenza vaccine and recommended shingles vaccine.  Patient voiced understanding.  Patient consented to methotrexate.  Will wait for baseline lab results prior to initiation of methotrexate therapy.     Elisabeth Most, Pharm.D., BCPS Clinical Pharmacist Pager: [phone] Phone: [phone] 05/24/2016 8:58 AM

## 2016-05-24 NOTE — Progress Notes (Signed)
TSH normal

## 2016-05-24 NOTE — Patient Instructions (Addendum)
Prednisone taper using 5 mg tablets: Take 2 tablets (10 mg) by mouth daily for two weeks Then take 1 and one-half tablet (7.5 mg) by mouth daily for two weeks Then take 1 tablet (5 mg) by mouth daily for two weeks, Then take one-half tablet (2.5 mg) by mouth daily for two weeks, then stop.    Please check your blood sugar closely, and contact your endocrinologist if you blood sugar increases.    Standing Labs We placed an order today for your standing lab work.    Please come back and get your standing labs every 2 weeks times 3 after starting methotrexate then every 2 months.    We have open lab Monday through Friday from 8:30-11:30 AM and 1:30-4 PM at the office of Dr. Tresa Moore, PA.   The office is located at [address] [place], [place], [place] [address] No appointment is necessary.   Labs are drawn by [facility].  You Aderman receive a bill from [facility] for your lab work.    Methotrexate tablets What is this medicine? METHOTREXATE (METH oh TREX ate) is a chemotherapy drug used to treat cancer including breast cancer, leukemia, and lymphoma. This medicine can also be used to treat psoriasis and certain kinds of arthritis. This medicine Misko be used for other purposes; ask your health care provider or pharmacist if you have questions. COMMON BRAND NAME(S): Rheumatrex, Trexall What should I tell my health care provider before I take this medicine? They need to know if you have any of these conditions: -fluid in the stomach area or lungs -if you often drink alcohol -infection or immune system problems -kidney disease or on hemodialysis -liver disease -low blood counts, like low white cell, platelet, or red cell counts -lung disease -radiation therapy -stomach ulcers -ulcerative colitis -an unusual or allergic reaction to methotrexate, other medicines, foods, dyes, or preservatives -pregnant or trying to get pregnant -breast-feeding How should I use this  medicine? Take this medicine by mouth with a glass of water. Follow the directions on the prescription label. Take your medicine at regular intervals. Do not take it more often than directed. Do not stop taking except on your doctor's advice. Make sure you know why you are taking this medicine and how often you should take it. If this medicine is used for a condition that is not cancer, like arthritis or psoriasis, it should be taken weekly, NOT daily. Taking this medicine more often than directed can cause serious side effects, even death. Talk to your healthcare provider about safe handling and disposal of this medicine. You Doucet need to take special precautions. Talk to your pediatrician regarding the use of this medicine in children. While this drug Lins be prescribed for selected conditions, precautions do apply. Overdosage: If you think you have taken too much of this medicine contact a poison control center or emergency room at once. NOTE: This medicine is only for you. Do not share this medicine with others. What if I miss a dose? If you miss a dose, talk with your doctor or health care professional. Do not take double or extra doses. What Alkins interact with this medicine? This medicine Cybulski interact with the following medication: -acitretin -aspirin and aspirin-like medicines including salicylates -azathioprine -certain antibiotics like penicillins, tetracycline, and chloramphenicol -cyclosporine -gold -hydroxychloroquine -live virus vaccines -NSAIDs, medicines for pain and inflammation, like ibuprofen or naproxen -other cytotoxic agents -penicillamine -phenylbutazone -phenytoin -probenecid -retinoids such as isotretinoin and tretinoin -steroid medicines like prednisone or  cortisone -sulfonamides like sulfasalazine and trimethoprim/sulfamethoxazole -theophylline This list Lempke not describe all possible interactions. Give your health care provider a list of all the medicines, herbs,  non-prescription drugs, or dietary supplements you use. Also tell them if you smoke, drink alcohol, or use illegal drugs. Some items Dam interact with your medicine. What should I watch for while using this medicine? Avoid alcoholic drinks. This medicine can make you more sensitive to the sun. Keep out of the sun. If you cannot avoid being in the sun, wear protective clothing and use sunscreen. Do not use sun lamps or tanning beds/booths. You Giovanni need blood work done while you are taking this medicine. Call your doctor or health care professional for advice if you get a fever, chills or sore throat, or other symptoms of a cold or flu. Do not treat yourself. This drug decreases your body's ability to fight infections. Try to avoid being around people who are sick. This medicine Rispoli increase your risk to bruise or bleed. Call your doctor or health care professional if you notice any unusual bleeding. Check with your doctor or health care professional if you get an attack of severe diarrhea, nausea and vomiting, or if you sweat a lot. The loss of too much body fluid can make it dangerous for you to take this medicine. Talk to your doctor about your risk of cancer. You Cabral be more at risk for certain types of cancers if you take this medicine. Both men and women must use effective birth control with this medicine. Do not become pregnant while taking this medicine or until at least 1 normal menstrual cycle has occurred after stopping it. Women should inform their doctor if they wish to become pregnant or think they might be pregnant. Men should not father a child while taking this medicine and for 3 months after stopping it. There is a potential for serious side effects to an unborn child. Talk to your health care professional or pharmacist for more information. Do not breast-feed an infant while taking this medicine. What side effects Grunden I notice from receiving this medicine? Side effects that you should  report to your doctor or health care professional as soon as possible: -allergic reactions like skin rash, itching or hives, swelling of the face, lips, or tongue -breathing problems or shortness of breath -diarrhea -dry, nonproductive cough -low blood counts - this medicine Gunkel decrease the number of white blood cells, red blood cells and platelets. You Rua be at increased risk for infections and bleeding. -mouth sores -redness, blistering, peeling or loosening of the skin, including inside the mouth -signs of infection - fever or chills, cough, sore throat, pain or trouble passing urine -signs and symptoms of bleeding such as bloody or black, tarry stools; red or dark-brown urine; spitting up blood or brown material that looks like coffee grounds; red spots on the skin; unusual bruising or bleeding from the eye, gums, or nose -signs and symptoms of kidney injury like trouble passing urine or change in the amount of urine -signs and symptoms of liver injury like dark yellow or brown urine; general ill feeling or flu-like symptoms; light-colored stools; loss of appetite; nausea; right upper belly pain; unusually weak or tired; yellowing of the eyes or skin Side effects that usually do not require medical attention (report to your doctor or health care professional if they continue or are bothersome): -dizziness -hair loss -tiredness -upset stomach -vomiting This list Kraner not describe all possible side effects. Call  your doctor for medical advice about side effects. You Stamper report side effects to FDA at 1-800-FDA-1088. Where should I keep my medicine? Keep out of the reach of children. Store at room temperature between 20 and 25 degrees C (68 and 77 degrees F). Protect from light. Throw away any unused medicine after the expiration date. NOTE: This sheet is a summary. It Pinder not cover all possible information. If you have questions about this medicine, talk to your doctor, pharmacist, or health  care provider.  2017 Elsevier/Gold Standard (2015-01-04 05:39:22)   Rheumatoid Arthritis Rheumatoid arthritis (RA) is a long-term (chronic) disease that causes inflammation in your joints. RA Pehl start slowly. It usually affects the small joints of the hands and feet. Usually, the same joints are affected on both sides of your body. Inflammation from RA can also affect other parts of your body, including your heart, eyes, or lungs. RA is an autoimmune disease. That means that your body's defense system (immune system) mistakenly attacks healthy body tissues. There is no cure for RA, but medicines can help your symptoms and halt or slow down the progression of the disease. What are the causes? The exact cause of RA is not known. What increases the risk? This condition is more likely to develop in:  Women.  People who have a family history of RA or other autoimmune diseases. What are the signs or symptoms? Symptoms of this condition vary from person to person. Symptoms usually start gradually. They are often worse in the morning. The first symptom Antrim be morning stiffness that lasts longer than 30 minutes. As RA progresses, symptoms Medinger include:  Pain, stiffness, swelling, warmth, and tenderness in joints on both sides of your body.  Loss of energy.  Loss of appetite.  Weight loss.  Low-grade fever.  Dry eyes and dry mouth.  Firm lumps (rheumatoid nodules) that grow beneath your skin in areas such as your forearm bones near your elbows and on your hands.  Changes in the appearance of joints (deformity) and loss of joint function. Symptoms of RA often come and go. Sometimes, symptoms get worse for a period of time. These are called flares. How is this diagnosed? This condition is diagnosed based on your symptoms, medical history, and physical exam. You Corbridge have X-rays or MRI to check for the type of joint changes that are caused by RA. You Holan also have blood tests to look  for:  Proteins (antibodies) that your immune system Dowding make if you have RA. They include rheumatoid factor (RF) and anti-CCP.  When blood tests show these proteins, you are said to have "seropositive RA."  When blood tests do not show these proteins, you Delpino have "seronegative RA."  Inflammation in your blood.  A low number of red blood cells (anemia). How is this treated? The goals of treatment are to relieve pain, reduce inflammation, and slow down or stop joint damage and disability. Treatment Canales include:  Lifestyle changes. It is important to rest, eat a healthy diet, and exercise.  Medicines. Your health care provider Fitzner adjust your medicines every 3 months until treatment goals are reached. Common medicines include:  Pain relievers (analgesics).  Corticosteroids and NSAIDs to reduce inflammation.  Disease-modifying antirheumatic drugs (DMARDs) to try to slow the course of the disease.  Biologic response modifiers to reduce inflammation and damage.  Physical therapy and occupational therapy.  Surgery, if you have severe joint damage. Joint replacement or fusing of joints Glas be needed. Your health  care provider will work with you to identify the best treatment option for you based on assessment of the overall disease activity in your body. Follow these instructions at home:  Take over-the-counter and prescription medicines only as told by your health care provider.  Start an exercise program as told by your health care provider.  Rest when you are having a flare.  Return to your normal activities as told by your health care provider. Ask your health care provider what activities are safe for you.  Keep all follow-up visits as told by your health care provider. This is important. Where to find more information:  [employer] of Rheumatology: www.rheumatology.[place]ott: www.arthritis.org Contact a health care provider if:  You have a flare-up  of RA symptoms.  You have a fever.  You have side effects from your medicines. Get help right away if:  You have chest pain.  You have trouble breathing.  You quickly develop a hot, painful joint that is more severe than your usual joint aches. This information is not intended to replace advice given to you by your health care provider. Make sure you discuss any questions you have with your health care provider. Document Released: 04/28/2000 Document Revised: 10/05/2015 Document Reviewed: 02/11/2015 Elsevier Interactive Patient Education  2017 [employer].

## 2016-05-25 LAB — URINALYSIS, MICROSCOPIC ONLY
Crystals: NONE SEEN [HPF]
RBC / HPF: NONE SEEN RBC/HPF (ref <=2)
YEAST: NONE SEEN [HPF]

## 2016-05-25 LAB — IGG, IGA, IGM
IGG (IMMUNOGLOBIN G), SERUM: 960 mg/dL (ref 694–1618)
IgA: 210 mg/dL (ref 81–463)
IgM, Serum: 151 mg/dL (ref 48–271)

## 2016-05-25 LAB — COMPLETE METABOLIC PANEL WITH GFR
ALBUMIN: 4 g/dL (ref 3.6–5.1)
ALT: 18 U/L (ref 9–46)
AST: 18 U/L (ref 10–35)
Alkaline Phosphatase: 82 U/L (ref 40–115)
BILIRUBIN TOTAL: 0.7 mg/dL (ref 0.2–1.2)
BUN: 34 mg/dL — AB (ref 7–25)
CALCIUM: 9.6 mg/dL (ref 8.6–10.3)
CHLORIDE: 97 mmol/L — AB (ref 98–110)
CO2: 27 mmol/L (ref 20–31)
CREATININE: 1.58 mg/dL — AB (ref 0.70–1.25)
GFR, Est African American: 51 mL/min — ABNORMAL LOW (ref >=60)
GFR, Est Non African American: 44 mL/min — ABNORMAL LOW (ref >=60)
Glucose, Bld: 251 mg/dL — ABNORMAL HIGH (ref 65–99)
Potassium: 4.1 mmol/L (ref 3.5–5.3)
Sodium: 136 mmol/L (ref 135–146)
TOTAL PROTEIN: 6.8 g/dL (ref 6.1–8.1)

## 2016-05-25 LAB — URINALYSIS, ROUTINE W REFLEX MICROSCOPIC
BILIRUBIN URINE: NEGATIVE
Glucose, UA: NEGATIVE
Hgb urine dipstick: NEGATIVE
Leukocytes, UA: NEGATIVE
Nitrite: NEGATIVE
PH: 5 (ref 5.0–8.0)
SPECIFIC GRAVITY, URINE: 1.025 (ref 1.001–1.035)

## 2016-05-25 LAB — HEPATITIS C ANTIBODY: HCV Ab: NEGATIVE

## 2016-05-25 LAB — HEPATITIS A ANTIBODY, IGM: HEP A IGM: NONREACTIVE

## 2016-05-25 LAB — HEPATITIS B SURFACE ANTIGEN: Hepatitis B Surface Ag: NEGATIVE

## 2016-05-25 LAB — HEPATITIS B CORE ANTIBODY, IGM: Hep B C IgM: NONREACTIVE

## 2016-05-26 LAB — QUANTIFERON TB GOLD ASSAY (BLOOD)
Interferon Gamma Release Assay: NEGATIVE
QUANTIFERON NIL VALUE: 0.11 [IU]/mL
Quantiferon Tb Ag Minus Nil Value: 0.02 IU/mL

## 2016-05-26 LAB — PROTEIN ELECTROPHORESIS, SERUM, WITH REFLEX
Albumin ELP: 3.9 g/dL (ref 3.8–4.8)
Alpha-1-Globulin: 0.6 g/dL — ABNORMAL HIGH (ref 0.2–0.3)
Alpha-2-Globulin: 0.7 g/dL (ref 0.5–0.9)
Beta 2: 0.3 g/dL (ref 0.2–0.5)
Beta Globulin: 0.5 g/dL (ref 0.4–0.6)
GAMMA GLOBULIN: 0.9 g/dL (ref 0.8–1.7)
TOTAL PROTEIN, SERUM ELECTROPHOR: 6.8 g/dL (ref 6.1–8.1)

## 2016-05-28 NOTE — Progress Notes (Signed)
Glucose elevated, and GFR is low. I would not be able to start him on methotrexate. Pisa schedule a visit. Dr. Koleen Nimrod can probably discuss some use of Arava instead of methotrexate.

## 2016-06-02 ENCOUNTER — Telehealth: Payer: Self-pay | Admitting: Rheumatology

## 2016-06-02 DIAGNOSIS — R5383 Other fatigue: Secondary | ICD-10-CM | POA: Insufficient documentation

## 2016-06-02 DIAGNOSIS — M0579 Rheumatoid arthritis with rheumatoid factor of multiple sites without organ or systems involvement: Secondary | ICD-10-CM | POA: Insufficient documentation

## 2016-06-02 DIAGNOSIS — M24522 Contracture, left elbow: Secondary | ICD-10-CM | POA: Insufficient documentation

## 2016-06-02 NOTE — Telephone Encounter (Signed)
Patient's wife Joel Jones called wanting to know if lab results are in so patient can start his MTX. Please call back.

## 2016-06-02 NOTE — Telephone Encounter (Signed)
Have advised patient he cannot start Methotrexate have asked about tomorrow he will be here tomorrow am at 9:30 to discuss labs/ arava.

## 2016-06-02 NOTE — Progress Notes (Signed)
Office Visit Note  Patient: Joel Stary Wemhoff Sr.             Date of Birth: Hamelin 01, 1947           MRN: [id number]             PCP: [name], MD Referring: [name], MD Visit Date: 06/03/2016 Occupation: _0 @    Subjective:  Hand pain   History of Present Illness: Melecio Cueto Balint Sr. is a 71 y.o. male with history of rheumatoid arthritis. He's been taking prednisone 10 mg a day. He states he still continues to have some discomfort in his bilateral hands over the MCP joints. He's been taking Advil intermittently. None of the other joints are is painful.  Activities of Daily Living:  Patient reports morning stiffness for 3 hours.   Patient Reports nocturnal pain.  Difficulty dressing/grooming: Reports Difficulty climbing stairs: Reports Difficulty getting out of chair: Reports Difficulty using hands for taps, buttons, cutlery, and/or writing: Reports   Review of Systems  Constitutional: Positive for fatigue. Negative for night sweats and weakness ( ).  HENT: Negative for mouth sores, mouth dryness and nose dryness.   Eyes: Negative for redness and dryness.  Respiratory: Negative for shortness of breath and difficulty breathing.   Cardiovascular: Negative for chest pain, palpitations, hypertension, irregular heartbeat and swelling in legs/feet.  Gastrointestinal: Negative for constipation and diarrhea.  Endocrine: Negative for increased urination.  Musculoskeletal: Positive for arthralgias, joint pain, joint swelling and morning stiffness. Negative for myalgias, muscle weakness, muscle tenderness and myalgias.  Skin: Negative for color change, rash, hair loss, nodules/bumps, skin tightness, ulcers and sensitivity to sunlight.  Allergic/Immunologic: Negative for susceptible to infections.  Neurological: Negative for dizziness, fainting, memory loss and night sweats.  Hematological: Negative for swollen glands.  Psychiatric/Behavioral: Positive for sleep disturbance. Negative  for depressed mood. The patient is not nervous/anxious.     PMFS History:  Patient Active Problem List   Diagnosis Date Noted  . Rheumatoid arthritis involving multiple sites with positive rheumatoid factor ([place]) 06/02/2016  . Other fatigue 06/02/2016  . Contracture, elbow, left 06/02/2016  . Swelling of left hand 05/01/2016  . Osteoarthritis of right knee 08/27/2015  . Status post right partial knee replacement 08/27/2015  . Essential hypertension 05/11/2015  . Pain in the chest   . Right groin pain 08/15/2012  . Chest pain 04/02/2012  . Cerebrovascular disease 04/02/2012  . CAD (coronary artery disease) 04/02/2012  . Diabetes mellitus without complication ([place])   . Dizziness   . Sinus bradycardia   . Hyperlipidemia   . Edema     Past Medical History:  Diagnosis Date  . Anemia   . Arthritis    "all over"  . Bradycardia   . Chronic bronchitis ([place])    "get it just about q yr" (03/17/2014)  . Daily headache    "here lately" (03/17/2014); relates to sinuses  . GERD (gastroesophageal reflux disease)   . Hard of hearing    hearing aides bilat  . Hiatal hernia   . History of blood transfusion 2008   "related to OR"  . History of kidney stones   . Hyperlipidemia   . Hypertension   . Pneumonia 1972 X 1  . Type II diabetes mellitus ([place])   . Wears glasses     Family History  Problem Relation Age of Onset  . Cancer Father     prostate  . Heart disease      No family  history   Past Surgical History:  Procedure Laterality Date  . broken finger      surgical repaired left hand 2nd finger   . cyst removed      per left knee/posteriorly  . CYSTECTOMY Left 2009   "behind knee"  . INGUINAL HERNIA REPAIR Right ?2010  . JOINT REPLACEMENT    . KNEE ARTHROSCOPY Bilateral 1980's  . PARTIAL KNEE ARTHROPLASTY Right 08/27/2015   Procedure: RIGHT UNICOMPARTMENTAL KNEE ARTHROPLASTY;  Surgeon: Mcarthur Rossetti, MD;  Location: [facility];  Service: Orthopedics;  Laterality:  Right;  . REVISION TOTAL KNEE ARTHROPLASTY Left 2008  . TOTAL KNEE ARTHROPLASTY Left 2003   Social History   Social History Narrative   Patient lives at home with family.   Caffeine Use: occasionally     Objective: Vital Signs: BP 114/64   Pulse (!) 54   Resp 14   Ht _0  (1.676 m)   Wt 203 lb (92.1 kg)   BMI 32.77 kg/m    Physical Exam  Constitutional: He is oriented to person, place, and time. He appears well-developed and well-nourished.  HENT:  Head: Normocephalic and atraumatic.  Eyes: Conjunctivae and EOM are normal. Pupils are equal, round, and reactive to light.  Neck: Normal range of motion. Neck supple.  Cardiovascular: Normal rate, regular rhythm and normal heart sounds.   Pulmonary/Chest: Effort normal and breath sounds normal.  Abdominal: Soft. Bowel sounds are normal.  Neurological: He is alert and oriented to person, place, and time.  Skin: Skin is warm and dry. Capillary refill takes less than 2 seconds.  Psychiatric: He has a normal mood and affect. His behavior is normal.  Nursing note and vitals reviewed.    Musculoskeletal Exam: C-spine and thoracic lumbar spine good range of motion. Shoulder joints are good range of motion. He has contracture in his left elbow. Wrist joints are good range of motion with no synovitis. He has synovitis over bilateral second MCP joints but tender across, all MCPs and PIP joints. Hip joints knee joints ankles MTPs PIPs with good range of motion with no synovitis today.  CDAI Exam: CDAI Homunculus Exam:   Tenderness:  LUE: ulnohumeral and radiohumeral Right hand: 2nd MCP and 2nd PIP Left hand: 1st MCP, 2nd MCP and 2nd PIP  Swelling:  Right hand: 2nd MCP Left hand: 2nd MCP  Joint Counts:  CDAI Tender Joint count: 6 CDAI Swollen Joint count: 2  Global Assessments:  Patient Global Assessment: 4 Provider Global Assessment: 5  CDAI Calculated Score: 17    Investigation: Findings:  05/24/2016 TSH normal, UA  1+ protein, CMP creatinine 1.58 GFR 44, glucose 251, hepatitis panel negative, TB: Negative, HIV negative, SPEP negative, immunoglobulins normal   Office Visit on 05/24/2016  Component Date Value Ref Range Status  . Sodium 05/24/2016 136  135 - 146 mmol/L Final  . Potassium 05/24/2016 4.1  3.5 - 5.3 mmol/L Final  . Chloride 05/24/2016 97* 98 - 110 mmol/L Final  . CO2 05/24/2016 27  20 - 31 mmol/L Final  . Glucose, Bld 05/24/2016 251* 65 - 99 mg/dL Final  . BUN 05/24/2016 34* 7 - 25 mg/dL Final  . Creat 05/24/2016 1.58* 0.70 - 1.25 mg/dL Final   Comment:   For patients > or = 70 years of age: The upper reference limit for Creatinine is approximately 13% higher for people identified as African-American.     . Total Bilirubin 05/24/2016 0.7  0.2 - 1.2 mg/dL Final  . Alkaline Phosphatase  05/24/2016 82  40 - 115 U/L Final  . AST 05/24/2016 18  10 - 35 U/L Final  . ALT 05/24/2016 18  9 - 46 U/L Final  . Total Protein 05/24/2016 6.8  6.1 - 8.1 g/dL Final  . Albumin 05/24/2016 4.0  3.6 - 5.1 g/dL Final  . Calcium 05/24/2016 9.6  8.6 - 10.3 mg/dL Final  . GFR, Est African American 05/24/2016 51* >=60 mL/min Final  . GFR, Est Non African American 05/24/2016 44* >=60 mL/min Final  . Color, Urine 05/24/2016 DARK YELLOW  YELLOW Final  . APPearance 05/24/2016 CLEAR  CLEAR Final  . Specific Gravity, Urine 05/24/2016 1.025  1.001 - 1.035 Final  . pH 05/24/2016 5.0  5.0 - 8.0 Final  . Glucose, UA 05/24/2016 NEGATIVE  NEGATIVE Final  . Bilirubin Urine 05/24/2016 NEGATIVE  NEGATIVE Final  . Ketones, ur 05/24/2016 TRACE* NEGATIVE Final  . Hgb urine dipstick 05/24/2016 NEGATIVE  NEGATIVE Final  . Protein, ur 05/24/2016 1+* NEGATIVE Final  . Nitrite 05/24/2016 NEGATIVE  NEGATIVE Final  . Leukocytes, UA 05/24/2016 NEGATIVE  NEGATIVE Final  . Hep A IgM 05/24/2016 NON REACTIVE  NON REACTIVE Final  . Hep B C IgM 05/24/2016 NON REACTIVE  NON REACTIVE Final   Comment: High levels of Hepatitis B Core IgM  antibody are detectable during the acute stage of Hepatitis B. This antibody is used to differentiate current from past HBV infection.     Marland Kitchen HCV Ab 05/24/2016 NEGATIVE  NEGATIVE Final  . Interferon Gamma Release Assay 05/24/2016 NEGATIVE  NEGATIVE Final  . Quantiferon Nil Value 05/24/2016 0.11  IU/mL Final  . Mitogen-Nil 05/24/2016 >10.00  IU/mL Final  . Quantiferon Tb Ag Minus Nil Value 05/24/2016 0.02  IU/mL Final   Comment:   The Nil tube value is used to determine if the patient has a preexisting immune response which could cause a false-positive reading on the test. In order for a test to be valid, the Nil tube must have a value of less than or equal to 8.0 IU/mL.   The mitogen control tube is used to assure the patient has a healthy immune status and also serves as a control for correct blood handling and incubation. It is used to detect false-negative readings. The mitogen tube must have a gamma interferon value of greater than or equal to 0.5 IU/mL higher than the value of the Nil tube.   The TB antigen tube is coated with the M. tuberculosis specific antigens. For a test to be considered positive, the TB antigen tube value minus the Nil tube value must be greater than or equal to 0.35 IU/mL.   For additional information, please refer to http://education.questdiagnostics.com/faq/QFT (This link is being provided for informational/educational purposes only.)   . Total Protein, Serum Electrophores* 05/24/2016 6.8  6.1 - 8.1 g/dL Final  . Albumin ELP 05/24/2016 3.9  3.8 - 4.8 g/dL Final  . Alpha-1-Globulin 05/24/2016 0.6* 0.2 - 0.3 g/dL Final  . Alpha-2-Globulin 05/24/2016 0.7  0.5 - 0.9 g/dL Final  . Beta Globulin 05/24/2016 0.5  0.4 - 0.6 g/dL Final  . Beta 2 05/24/2016 0.3  0.2 - 0.5 g/dL Final  . Gamma Globulin 05/24/2016 0.9  0.8 - 1.7 g/dL Final  . Abnormal Protein Band1 05/24/2016 NOT DET  g/dL Final  . SPE Interp. 05/24/2016 SEE NOTE   Final   Comment: One or more  serum protein fractions are outside the normal ranges. No abnormal protein bands are apparent. Reviewed by Thayer Headings  Darla Lesches, MD, PhD, FCAP (Electronic Signature on File)   . Abnormal Protein Band2 05/24/2016 NOT DET  g/dL Final  . Abnormal Protein Band3 05/24/2016 NOT DET  g/dL Final  . IgG (Immunoglobin G), Serum 05/24/2016 960  694 - 1,618 mg/dL Final  . IgA 05/24/2016 210  81 - 463 mg/dL Final  . IgM, Serum 05/24/2016 151  48 - 271 mg/dL Final  . HIV 1&2 Ab, 4th Generation 05/24/2016 NONREACTIVE  NONREACTIVE Final   Comment:   HIV-1 antigen and HIV-1/HIV-2 antibodies were not detected.  There is no laboratory evidence of HIV infection.   HIV-1/2 Antibody Diff        Not indicated. HIV-1 RNA, Qual TMA          Not indicated.     PLEASE NOTE: This information has been disclosed to you from records whose confidentiality Habib be protected by state law. If your state requires such protection, then the state law prohibits you from making any further disclosure of the information without the specific written consent of the person to whom it pertains, or as otherwise permitted by law. A general authorization for the release of medical or other information is NOT sufficient for this purpose.   The performance of this assay has not been clinically validated in patients less than 14 years old.   For additional information please refer to http://education.questdiagnostics.com/faq/FAQ106.  (This link is being provided for informational/educational purposes only.)     . Hepatitis B Surface Ag 05/24/2016 NEGATIVE  NEGATIVE Final  . TSH 05/24/2016 3.03  0.40 - 4.50 mIU/L Final  . WBC, UA 05/24/2016 0-5  <=5 WBC/HPF Final  . RBC / HPF 05/24/2016 NONE SEEN  <=2 RBC/HPF Final  . Squamous Epithelial / LPF 05/24/2016 0-5  <=5 HPF Final  . Bacteria, UA 05/24/2016 FEW* NONE SEEN HPF Final  . Crystals 05/24/2016 NONE SEEN  NONE SEEN HPF Final  . Casts 05/24/2016 See Below* NONE SEEN LPF Final    . Yeast 05/24/2016 NONE SEEN  NONE SEEN HPF Final  Admission on 05/16/2016, Discharged on 05/16/2016  Component Date Value Ref Range Status  . Sed Rate 05/16/2016 26* 0 - 16 mm/hr Final  . CRP 05/16/2016 3.0* <1.0 mg/dL Final  . WBC 05/16/2016 8.2  4.0 - 10.5 K/uL Final  . RBC 05/16/2016 4.49  4.22 - 5.81 MIL/uL Final  . Hemoglobin 05/16/2016 13.8  13.0 - 17.0 g/dL Final  . HCT 05/16/2016 38.9* 39.0 - 52.0 % Final  . MCV 05/16/2016 86.6  78.0 - 100.0 fL Final  . MCH 05/16/2016 30.7  26.0 - 34.0 pg Final  . MCHC 05/16/2016 35.5  30.0 - 36.0 g/dL Final  . RDW 05/16/2016 13.2  11.5 - 15.5 % Final  . Platelets 05/16/2016 353  150 - 400 K/uL Final  . Neutrophils Relative % 05/16/2016 65  % Final  . Neutro Abs 05/16/2016 5.4  1.7 - 7.7 K/uL Final  . Lymphocytes Relative 05/16/2016 21  % Final  . Lymphs Abs 05/16/2016 1.7  0.7 - 4.0 K/uL Final  . Monocytes Relative 05/16/2016 11  % Final  . Monocytes Absolute 05/16/2016 0.9  0.1 - 1.0 K/uL Final  . Eosinophils Relative 05/16/2016 2  % Final  . Eosinophils Absolute 05/16/2016 0.1  0.0 - 0.7 K/uL Final  . Basophils Relative 05/16/2016 1  % Final  . Basophils Absolute 05/16/2016 0.0  0.0 - 0.1 K/uL Final  . Sodium 05/16/2016 135  135 - 145 mmol/L Final  .  Potassium 05/16/2016 3.8  3.5 - 5.1 mmol/L Final  . Chloride 05/16/2016 101  101 - 111 mmol/L Final  . CO2 05/16/2016 25  22 - 32 mmol/L Final  . Glucose, Bld 05/16/2016 189* 65 - 99 mg/dL Final  . BUN 05/16/2016 18  6 - 20 mg/dL Final  . Creatinine, Ser 05/16/2016 0.97  0.61 - 1.24 mg/dL Final  . Calcium 05/16/2016 9.0  8.9 - 10.3 mg/dL Final  . GFR calc non Af Amer 05/16/2016 >60  >60 mL/min Final  . GFR calc Af Amer 05/16/2016 >60  >60 mL/min Final   Comment: (NOTE) The eGFR has been calculated using the CKD EPI equation. This calculation has not been validated in all clinical situations. eGFR's persistently <60 mL/min signify possible Chronic Kidney Disease.   . Anion gap  05/16/2016 9  5 - 15 Final  . ANA Ab, IFA 05/16/2016 Negative   Final   Comment: (NOTE)                                     Negative   <1:80                                     Borderline  1:80                                     Positive   >1:80 Performed At: [facility] [place], [place] [id number] Lindon Romp MD [id number]   . CCP Antibodies IgG/IgA 05/16/2016 >250* 0 - 19 units Final   Comment: (NOTE)                          Negative               <20                          Weak positive      20 - 39                          Moderate positive  40 - 59                          Strong positive        >59 Performed At: [facility] [place], [place] [id number] Lindon Romp MD [id number]   . Rhuematoid fact SerPl-aCnc 05/16/2016 156.6* 0.0 - 13.9 IU/mL Final   Comment: (NOTE) Results confirmed on dilution. Performed At: [facility] [place], [place] [id number] Lindon Romp MD [id number]   Office Visit on 04/27/2016  Component Date Value Ref Range Status  . Total CK 04/27/2016 86  7 - 232 U/L Final    Imaging: Xr Foot 2 Views Left  Result Date: 05/24/2016 PIP/DIP narrowing, no MTP narrowing no erosive changes. Impression: These findings are consistent with osteoarthritis  Xr Foot 2 Views Right  Result Date: 05/24/2016 PIP/DIP narrowing, no MTP joint narrowing no erosive changes. Impression: These findings are consistent with osteoarthritis  Xr Hand 2 View Left  Result Date: 05/24/2016 All PIP/DIP and  CMC narrowing. No MCP joint narrowing no intercarpal joint space narrowing no erosive changes. These findings are consistent with osteoarthritis.  Xr Hand 2 View Right  Result Date: 05/24/2016 Right second MCP joint narrowing. All PIP/DIP narrowing. CMC joint narrowing. No erosive changes. Impression: These findings are consistent with osteoarthritis and rheumatoid arthritis  overlap.   Speciality Comments: No specialty comments available.    Procedures:  No procedures performed Allergies: Invokana [canagliflozin]; Lipitor [atorvastatin]; Losartan; Vicodin [hydrocodone-acetaminophen]; Codeine; and Roxicodone [oxycodone]   Assessment / Plan:     Visit Diagnoses: Rheumatoid arthritis involving multiple sites with positive rheumatoid factor (HCC) - +RF +CCP,-ANA, elevated sedimentation rate, positive synovitis noted today and bilateral MCP joints and tenderness over MCPs and PIP joints. He has left elbow joint contracture. He's been on prednisone 10 mg by mouth daily which is helping him to some extent. The plan was to start him on methotrexate but due to elevation of his creatinine and we decided to move onto [place]. Indications side effects contraindications were discussed. Handout was given consent was taken. The plan is to start him on Arava 10 mg by mouth daily for 2 weeks and then increase it to 20 mg by mouth daily if tolerated. I'll check labs in 2 weeks 2 and then every 2 months to monitor for drug toxicity he's been also taking over-the-counter Advil and have advised him not to take any anti-inflammatories due to elevated creatinine.   S/P right unicompartmental knee replacement - With effusion: Is still having some discomfort  H/O total knee replacement, left: Doing fairly well  Contracture, elbow, left no change from last visit  Other fatigue: Continues to have fatigue.  Other medical problems are listed as follows.  Diabetes mellitus without complication ([place])  Mixed hyperlipidemia  Cerebrovascular disease  Coronary artery disease due to lipid rich plaque  Essential hypertension    Orders: Orders Placed This Encounter  Procedures  . CBC with Differential/Platelet  . COMPLETE METABOLIC PANEL WITH GFR   No orders of the defined types were placed in this encounter.   Face-to-face time spent with patient was 30 minutes. 50% of time was  spent in counseling and coordination of care.  Follow-Up Instructions: Return in about 2 months (around 08/01/2016) for Rheumatoid arthritis.   Bo Merino, MD  Note - This record has been created using Editor, commissioning.  Chart creation errors have been sought, but Jelinek not always  have been located. Such creation errors do not reflect on  the standard of medical care.

## 2016-06-03 ENCOUNTER — Telehealth: Payer: Self-pay | Admitting: Radiology

## 2016-06-03 ENCOUNTER — Encounter: Payer: Self-pay | Admitting: Rheumatology

## 2016-06-03 ENCOUNTER — Ambulatory Visit (INDEPENDENT_AMBULATORY_CARE_PROVIDER_SITE_OTHER): Payer: PPO | Admitting: Rheumatology

## 2016-06-03 ENCOUNTER — Other Ambulatory Visit: Payer: Self-pay | Admitting: Rheumatology

## 2016-06-03 VITALS — BP 114/64 | HR 54 | Resp 14 | Ht 66.0 in | Wt 203.0 lb

## 2016-06-03 DIAGNOSIS — M79641 Pain in right hand: Secondary | ICD-10-CM | POA: Diagnosis not present

## 2016-06-03 DIAGNOSIS — Z96651 Presence of right artificial knee joint: Secondary | ICD-10-CM

## 2016-06-03 DIAGNOSIS — I10 Essential (primary) hypertension: Secondary | ICD-10-CM

## 2016-06-03 DIAGNOSIS — E119 Type 2 diabetes mellitus without complications: Secondary | ICD-10-CM

## 2016-06-03 DIAGNOSIS — M24522 Contracture, left elbow: Secondary | ICD-10-CM

## 2016-06-03 DIAGNOSIS — I2510 Atherosclerotic heart disease of native coronary artery without angina pectoris: Secondary | ICD-10-CM

## 2016-06-03 DIAGNOSIS — M0579 Rheumatoid arthritis with rheumatoid factor of multiple sites without organ or systems involvement: Secondary | ICD-10-CM

## 2016-06-03 DIAGNOSIS — E782 Mixed hyperlipidemia: Secondary | ICD-10-CM

## 2016-06-03 DIAGNOSIS — Z96652 Presence of left artificial knee joint: Secondary | ICD-10-CM

## 2016-06-03 DIAGNOSIS — R5383 Other fatigue: Secondary | ICD-10-CM

## 2016-06-03 DIAGNOSIS — Z79899 Other long term (current) drug therapy: Secondary | ICD-10-CM

## 2016-06-03 DIAGNOSIS — M2550 Pain in unspecified joint: Secondary | ICD-10-CM

## 2016-06-03 DIAGNOSIS — I2583 Coronary atherosclerosis due to lipid rich plaque: Secondary | ICD-10-CM

## 2016-06-03 DIAGNOSIS — I679 Cerebrovascular disease, unspecified: Secondary | ICD-10-CM | POA: Diagnosis not present

## 2016-06-03 DIAGNOSIS — M79642 Pain in left hand: Secondary | ICD-10-CM

## 2016-06-03 MED ORDER — LEFLUNOMIDE 10 MG PO TABS: 10.0000 mg | ORAL_TABLET | Freq: Every day | ORAL | 0 refills | Status: DC

## 2016-06-03 MED ORDER — LEFLUNOMIDE 20 MG PO TABS: 20.0000 mg | ORAL_TABLET | Freq: Every day | ORAL | 2 refills | Status: DC

## 2016-06-03 NOTE — Progress Notes (Signed)
I discussed new medications with patient in office today/ patient signed a consent form. However, when he left the office I noted I had given him the incorrect paperwork. I have called patient and have given them the correct paperwork and consent for [place]. Patient understands instructions for dosing and for the need for labs every 2 weeks then every 2 months. His wife is also aware. I have apologized for the confusion and discussed with Dr Estanislado Pandy

## 2016-06-03 NOTE — Patient Instructions (Addendum)
Standing Labs We placed an order today for your standing lab work.    Please come back and get your standing labs in 2 weeks 2 and then every 2 months  We have open lab Monday through Friday from 8:30-11:30 AM and 1:30-4 PM at the office of Dr. Tresa Moore, PA.   The office is located at [address], [place], [place], [place] [address] No appointment is necessary.   Labs are drawn by [facility].  You Keltz receive a bill from [facility] for your lab Leflunomide tablets What is this medicine? LEFLUNOMIDE (le FLOO na mide) is for rheumatoid arthritis. This medicine Selders be used for other purposes; ask your health care provider or pharmacist if you have questions. COMMON BRAND NAME(S): Arava What should I tell my health care provider before I take this medicine? They need to know if you have any of these conditions: -alcoholism -bone marrow problems -fever or infection -immune system problems -kidney disease -liver disease -an unusual or allergic reaction to leflunomide, teriflunomide, other medicines, lactose, foods, dyes, or preservatives -pregnant or trying to get pregnant -breast-feeding How should I use this medicine? Take this medicine by mouth with a full glass of water. Follow the directions on the prescription label. Take your medicine at regular intervals. Do not take your medicine more often than directed. Do not stop taking except on your doctor's advice. Talk to your pediatrician regarding the use of this medicine in children. Special care Hoffmaster be needed. Overdosage: If you think you have taken too much of this medicine contact a poison control center or emergency room at once. NOTE: This medicine is only for you. Do not share this medicine with others. What if I miss a dose? If you miss a dose, take it as soon as you can. If it is almost time for your next dose, take only that dose. Do not take double or extra doses. What Grimsley interact with this medicine? Do  not take this medicine with any of the following medications: -teriflunomide This medicine Firmin also interact with the following medications: -charcoal -cholestyramine -methotrexate -NSAIDs, medicines for pain and inflammation, like ibuprofen or naproxen -phenytoin -rifampin -tolbutamide -vaccines -warfarin This list Niven not describe all possible interactions. Give your health care provider a list of all the medicines, herbs, non-prescription drugs, or dietary supplements you use. Also tell them if you smoke, drink alcohol, or use illegal drugs. Some items Wich interact with your medicine. What should I watch for while using this medicine? Visit your doctor or health care professional for regular checks on your progress. You will need frequent blood checks while you are receiving the medicine. If you get a cold or other infection while receiving this medicine, call your doctor or health care professional. Do not treat yourself. The medicine Schlatter increase your risk of getting an infection. If you are a woman who has the potential to become pregnant, discuss birth control options with your doctor or health care professional. Dennis Bast must not be pregnant, and you must be using a reliable form of birth control. The medicine Yusuf harm an unborn baby. Immediately call your doctor if you think you might be pregnant. Alcoholic drinks Weichert increase possible damage to your liver. Do not drink alcohol while taking this medicine. What side effects Daum I notice from receiving this medicine? Side effects that you should report to your doctor or health care professional as soon as possible: -allergic reactions like skin rash, itching or hives, swelling of the  face, lips, or tongue -cough -difficulty breathing or shortness of breath -fever, chills or any other sign of infection -redness, blistering, peeling or loosening of the skin, including inside the mouth -unusual bleeding or bruising -unusually weak or  tired -vomiting -yellowing of eyes or skin Side effects that usually do not require medical attention (report to your doctor or health care professional if they continue or are bothersome): -diarrhea -hair loss -headache -nausea This list Gattuso not describe all possible side effects. Call your doctor for medical advice about side effects. You Leason report side effects to FDA at 1-800-FDA-1088. Where should I keep my medicine? Keep out of the reach of children. Store at room temperature between 15 and 30 degrees C (59 and 86 degrees F). Protect from moisture and light. Throw away any unused medicine after the expiration date. NOTE: This sheet is a summary. It Ceesay not cover all possible information. If you have questions about this medicine, talk to your doctor, pharmacist, or health care provider.  2017 Elsevier/Gold Standard (2013-04-29 10:53:11)

## 2016-06-06 NOTE — Telephone Encounter (Signed)
I called them to come in for lab review/ he and his wife were here on Saturday

## 2016-06-08 ENCOUNTER — Ambulatory Visit (INDEPENDENT_AMBULATORY_CARE_PROVIDER_SITE_OTHER): Payer: PPO | Admitting: Orthopaedic Surgery

## 2016-06-08 DIAGNOSIS — G8929 Other chronic pain: Secondary | ICD-10-CM

## 2016-06-08 DIAGNOSIS — M25561 Pain in right knee: Secondary | ICD-10-CM

## 2016-06-08 NOTE — Progress Notes (Signed)
The patient is here today for scheduled hyaluronic acid injection into his right knee. He is done well since arthroscopic intervention of that knee. He does have a partial knee replacement on the medial compartments are well but he does have 90s Ross arthritis on the lateral compartment. He is also been on prednisone due to severe rheumatoid disease and flareup and they're tapering this down. He said the knee is feeling much better overall.  On examination of his knee there is a mild effusion. Before I placed the hyaluronic acid injections knee house of aspirate 15 mL of serous fluid from the knee. I then placed the hyaluronic acid injection without difficulty. He'll continue increase his activities as tolerated. I'll see him back in 2 months to see how is doing overall. No x-rays are needed.

## 2016-06-15 DIAGNOSIS — E1122 Type 2 diabetes mellitus with diabetic chronic kidney disease: Secondary | ICD-10-CM | POA: Diagnosis not present

## 2016-06-15 DIAGNOSIS — Z794 Long term (current) use of insulin: Secondary | ICD-10-CM | POA: Diagnosis not present

## 2016-06-15 DIAGNOSIS — I10 Essential (primary) hypertension: Secondary | ICD-10-CM | POA: Diagnosis not present

## 2016-06-15 DIAGNOSIS — E1165 Type 2 diabetes mellitus with hyperglycemia: Secondary | ICD-10-CM | POA: Diagnosis not present

## 2016-06-15 DIAGNOSIS — N182 Chronic kidney disease, stage 2 (mild): Secondary | ICD-10-CM | POA: Diagnosis not present

## 2016-06-16 ENCOUNTER — Telehealth: Payer: Self-pay | Admitting: Rheumatology

## 2016-06-16 MED ORDER — PREDNISONE 5 MG PO TABS: ORAL_TABLET | ORAL | 0 refills | Status: DC

## 2016-06-16 NOTE — Telephone Encounter (Signed)
Per Patient, he was advised by Dr. Estanislado Pandy to take tylenol for the pain he is having and he has had two really bad days this week and the tylenol hasn't touched the pain. He would like to know if there is something else he can take for pain? Per the patient's wife, he is allergic to several pain relievers. Please advise.

## 2016-06-16 NOTE — Telephone Encounter (Signed)
Patient is having a flare because his liver has not kicked in yet.He started Arava 10 mg of approximately 06/04/2016  He's tapering off of the prednisone and is currently on 1-1/2 tablet of 5 mg prednisone.When he got to this dose, he started to flare.As a result, to make him feel comfortable until Arava is effective, I will increase his prednisone temporarily as followsPrednisone 5 mg4 pills every morning for 5 days, 3 pills every morning for 5 days, 2 pills every morning for 5 days, return back to the previous schedule and finished a taper.  Disp: 45pills  I've instructed patient's wife on the proper dosing and she knows how to take the medication

## 2016-06-16 NOTE — Telephone Encounter (Signed)
Patient is having pain in his hands and his shoulders and across the back of his neck. Patient states Tylenol is not touching it. Patient is unable to dress himself due to the pain and has been brought to tears due to the pain. What else can he due for the pain?

## 2016-06-21 ENCOUNTER — Other Ambulatory Visit (INDEPENDENT_AMBULATORY_CARE_PROVIDER_SITE_OTHER): Payer: Self-pay

## 2016-06-21 DIAGNOSIS — Z79899 Other long term (current) drug therapy: Secondary | ICD-10-CM | POA: Diagnosis not present

## 2016-06-21 LAB — COMPLETE METABOLIC PANEL WITH GFR
ALBUMIN: 4 g/dL (ref 3.6–5.1)
ALK PHOS: 81 U/L (ref 40–115)
ALT: 23 U/L (ref 9–46)
AST: 21 U/L (ref 10–35)
BILIRUBIN TOTAL: 0.5 mg/dL (ref 0.2–1.2)
BUN: 27 mg/dL — ABNORMAL HIGH (ref 7–25)
CO2: 26 mmol/L (ref 20–31)
CREATININE: 1.33 mg/dL — AB (ref 0.70–1.25)
Calcium: 9.5 mg/dL (ref 8.6–10.3)
Chloride: 99 mmol/L (ref 98–110)
GFR, EST NON AFRICAN AMERICAN: 54 mL/min — AB (ref >=60)
GFR, Est African American: 63 mL/min (ref >=60)
GLUCOSE: 331 mg/dL — AB (ref 65–99)
Potassium: 4.6 mmol/L (ref 3.5–5.3)
Sodium: 133 mmol/L — ABNORMAL LOW (ref 135–146)
TOTAL PROTEIN: 6.4 g/dL (ref 6.1–8.1)

## 2016-06-21 LAB — CBC WITH DIFFERENTIAL/PLATELET
BASOS ABS: 111 {cells}/uL (ref 0–200)
BASOS PCT: 1 %
EOS ABS: 111 {cells}/uL (ref 15–500)
Eosinophils Relative: 1 %
HCT: 38.6 % (ref 38.5–50.0)
HEMOGLOBIN: 12.7 g/dL — AB (ref 13.2–17.1)
LYMPHS ABS: 1554 {cells}/uL (ref 850–3900)
Lymphocytes Relative: 14 %
MCH: 30.5 pg (ref 27.0–33.0)
MCHC: 32.9 g/dL (ref 32.0–36.0)
MCV: 92.8 fL (ref 80.0–100.0)
MPV: 10 fL (ref 7.5–12.5)
Monocytes Absolute: 777 cells/uL (ref 200–950)
Monocytes Relative: 7 %
NEUTROS ABS: 8547 {cells}/uL — AB (ref 1500–7800)
Neutrophils Relative %: 77 %
Platelets: 308 10*3/uL (ref 140–400)
RBC: 4.16 MIL/uL — ABNORMAL LOW (ref 4.20–5.80)
RDW: 13.2 % (ref 11.0–15.0)
WBC: 11.1 10*3/uL — ABNORMAL HIGH (ref 3.8–10.8)

## 2016-06-23 ENCOUNTER — Telehealth: Payer: Self-pay | Admitting: Rheumatology

## 2016-06-23 NOTE — Telephone Encounter (Signed)
I called patient regarding his lab work, his glucose is elevated Na low, he had appt this week with his PCP (he has an insulin pump). PCP has advised him glucose will remain elevated while his is on the prednisone. His PCP has adjusted insulin, and instructed patient on management. He increased dose of his [place] today to 20mg . I have discussed all this with Joel Jones

## 2016-06-23 NOTE — Telephone Encounter (Signed)
Patient's wife called about lab results from earlier this week. Please advise.

## 2016-06-27 DIAGNOSIS — E7800 Pure hypercholesterolemia, unspecified: Secondary | ICD-10-CM | POA: Diagnosis not present

## 2016-06-27 DIAGNOSIS — Z794 Long term (current) use of insulin: Secondary | ICD-10-CM | POA: Diagnosis not present

## 2016-06-27 DIAGNOSIS — I10 Essential (primary) hypertension: Secondary | ICD-10-CM | POA: Diagnosis not present

## 2016-06-27 DIAGNOSIS — E1122 Type 2 diabetes mellitus with diabetic chronic kidney disease: Secondary | ICD-10-CM | POA: Diagnosis not present

## 2016-06-27 DIAGNOSIS — Z7984 Long term (current) use of oral hypoglycemic drugs: Secondary | ICD-10-CM | POA: Diagnosis not present

## 2016-06-27 DIAGNOSIS — N183 Chronic kidney disease, stage 3 (moderate): Secondary | ICD-10-CM | POA: Diagnosis not present

## 2016-06-27 DIAGNOSIS — R001 Bradycardia, unspecified: Secondary | ICD-10-CM | POA: Diagnosis not present

## 2016-06-27 LAB — LIPID PANEL
CHOLESTEROL: 130 (ref 0–200)
HDL: 39 (ref 35–70)
LDL Cholesterol: 54
LDl/HDL Ratio: 3.3
Triglycerides: 185 — AB (ref 40–160)

## 2016-07-04 DIAGNOSIS — L820 Inflamed seborrheic keratosis: Secondary | ICD-10-CM | POA: Diagnosis not present

## 2016-07-04 DIAGNOSIS — L821 Other seborrheic keratosis: Secondary | ICD-10-CM | POA: Diagnosis not present

## 2016-07-04 DIAGNOSIS — D1801 Hemangioma of skin and subcutaneous tissue: Secondary | ICD-10-CM | POA: Diagnosis not present

## 2016-07-06 ENCOUNTER — Encounter: Payer: Self-pay | Admitting: Rheumatology

## 2016-07-06 ENCOUNTER — Ambulatory Visit (INDEPENDENT_AMBULATORY_CARE_PROVIDER_SITE_OTHER): Payer: Medicare Other | Admitting: Rheumatology

## 2016-07-06 ENCOUNTER — Ambulatory Visit: Payer: Medicare Other | Admitting: Rheumatology

## 2016-07-06 ENCOUNTER — Telehealth: Payer: Self-pay | Admitting: Rheumatology

## 2016-07-06 VITALS — BP 120/60 | HR 62 | Resp 16 | Wt 192.0 lb

## 2016-07-06 DIAGNOSIS — M2550 Pain in unspecified joint: Secondary | ICD-10-CM

## 2016-07-06 DIAGNOSIS — I2510 Atherosclerotic heart disease of native coronary artery without angina pectoris: Secondary | ICD-10-CM

## 2016-07-06 DIAGNOSIS — Z96651 Presence of right artificial knee joint: Secondary | ICD-10-CM

## 2016-07-06 DIAGNOSIS — Z8639 Personal history of other endocrine, nutritional and metabolic disease: Secondary | ICD-10-CM | POA: Diagnosis not present

## 2016-07-06 DIAGNOSIS — I2583 Coronary atherosclerosis due to lipid rich plaque: Secondary | ICD-10-CM

## 2016-07-06 DIAGNOSIS — Z8679 Personal history of other diseases of the circulatory system: Secondary | ICD-10-CM

## 2016-07-06 DIAGNOSIS — M7989 Other specified soft tissue disorders: Secondary | ICD-10-CM

## 2016-07-06 DIAGNOSIS — M0579 Rheumatoid arthritis with rheumatoid factor of multiple sites without organ or systems involvement: Secondary | ICD-10-CM | POA: Diagnosis not present

## 2016-07-06 DIAGNOSIS — R5383 Other fatigue: Secondary | ICD-10-CM | POA: Diagnosis not present

## 2016-07-06 DIAGNOSIS — Z79899 Other long term (current) drug therapy: Secondary | ICD-10-CM

## 2016-07-06 MED ORDER — PREDNISONE 5 MG PO TABS: ORAL_TABLET | ORAL | 0 refills | Status: DC

## 2016-07-06 NOTE — Telephone Encounter (Signed)
Patient scheduled for today at 3:15pm 

## 2016-07-06 NOTE — Telephone Encounter (Signed)
Patient has been tapering down on his prednisone and states that approximately 2:00 this morning he begin having a flare. Patient neck, knees, elbows ands. Patient is  Having swelling in his hands. Patient is having trouble moving his left elbow. Patient is unable to stay on Prednisone long term due to his diabetes. He feels as if the [place] is not working. Patient would like to know what else he can do.

## 2016-07-06 NOTE — Progress Notes (Signed)
Office Visit Note  Patient: Joel Furey Staller Sr.             Date of Birth: Urbanek 29, 1947           MRN: [id number]             PCP: [name], MD Referring: [name], MD Visit Date: 07/06/2016 Occupation: '@GUAROCC' @    Subjective: Pain and swelling in hands   History of Present Illness: Joel Brannan Giorgio Sr. is a 71 y.o. male with sero positive rheumatoid arthritis. He states she's been having pain and swelling in his bilateral hands now. He's been on Arava 10 mg by mouth daily starting January and now increased to 20 mg a day in February. He has not noticed much improvement in his symptoms. He has reduced his prednisone to 5 mg by mouth daily when he developed a flare again. He's been having pain in his C-spine, bilateral shoulders bilateral hands bilateral elbow joints, but lateral hip joints bilateral knee joints and bilateral ankles.  Activities of Daily Living:  Patient reports morning stiffness for 6  hours.   Patient Reports nocturnal pain.  Difficulty dressing/grooming: Reports Difficulty climbing stairs: Reports Difficulty getting out of chair: Reports Difficulty using hands for taps, buttons, cutlery, and/or writing: Reports   Review of Systems  Constitutional: Positive for fatigue. Negative for night sweats and weakness ( ).  HENT: Negative for mouth sores, mouth dryness and nose dryness.   Eyes: Negative for redness and dryness.  Respiratory: Negative for shortness of breath and difficulty breathing.   Cardiovascular: Negative for chest pain, palpitations, hypertension, irregular heartbeat and swelling in legs/feet.  Gastrointestinal: Negative for constipation and diarrhea.  Endocrine: Negative for increased urination.  Musculoskeletal: Positive for arthralgias, joint pain, joint swelling, myalgias, morning stiffness and myalgias. Negative for muscle weakness and muscle tenderness.  Skin: Negative for color change, rash, hair loss, nodules/bumps, skin tightness, ulcers and  sensitivity to sunlight.  Allergic/Immunologic: Negative for susceptible to infections.  Neurological: Negative for dizziness, fainting, memory loss and night sweats.  Hematological: Negative for swollen glands.  Psychiatric/Behavioral: Positive for sleep disturbance. Negative for depressed mood. The patient is not nervous/anxious.     PMFS History:  Patient Active Problem List   Diagnosis Date Noted  . High risk medication use 07/06/2016  . Rheumatoid arthritis involving multiple sites with positive rheumatoid factor ([place]) 06/02/2016  . Other fatigue 06/02/2016  . Contracture, elbow, left 06/02/2016  . Swelling of left hand 05/01/2016  . Osteoarthritis of right knee 08/27/2015  . Status post right partial knee replacement 08/27/2015  . Essential hypertension 05/11/2015  . Pain in the chest   . Right groin pain 08/15/2012  . Chest pain 04/02/2012  . Cerebrovascular disease 04/02/2012  . CAD (coronary artery disease) 04/02/2012  . Diabetes mellitus without complication ([place])   . Dizziness   . Sinus bradycardia   . Hyperlipidemia   . Edema     Past Medical History:  Diagnosis Date  . Anemia   . Arthritis    "all over"  . Bradycardia   . Chronic bronchitis ([place])    "get it just about q yr" (03/17/2014)  . Daily headache    "here lately" (03/17/2014); relates to sinuses  . GERD (gastroesophageal reflux disease)   . Hard of hearing    hearing aides bilat  . Hiatal hernia   . History of blood transfusion 2008   "related to OR"  . History of kidney stones   .  Hyperlipidemia   . Hypertension   . Pneumonia 1972 X 1  . Type II diabetes mellitus ([place])   . Wears glasses     Family History  Problem Relation Age of Onset  . Cancer Father     prostate  . Heart disease      No family history   Past Surgical History:  Procedure Laterality Date  . broken finger      surgical repaired left hand 2nd finger   . cyst removed      per left knee/posteriorly  . CYSTECTOMY Left  2009   "behind knee"  . INGUINAL HERNIA REPAIR Right ?2010  . JOINT REPLACEMENT    . KNEE ARTHROSCOPY Bilateral 1980's  . PARTIAL KNEE ARTHROPLASTY Right 08/27/2015   Procedure: RIGHT UNICOMPARTMENTAL KNEE ARTHROPLASTY;  Surgeon: Mcarthur Rossetti, MD;  Location: [facility];  Service: Orthopedics;  Laterality: Right;  . REVISION TOTAL KNEE ARTHROPLASTY Left 2008  . TOTAL KNEE ARTHROPLASTY Left 2003   Social History   Social History Narrative   Patient lives at home with family.   Caffeine Use: occasionally     Objective: Vital Signs: BP 120/60   Pulse 62   Resp 16   Wt 192 lb (87.1 kg)   BMI 30.99 kg/m    Physical Exam  Constitutional: He is oriented to person, place, and time. He appears well-developed and well-nourished.  HENT:  Head: Normocephalic and atraumatic.  Eyes: Conjunctivae and EOM are normal. Pupils are equal, round, and reactive to light.  Neck: Normal range of motion. Neck supple.  Cardiovascular: Normal rate, regular rhythm and normal heart sounds.   Pulmonary/Chest: Effort normal and breath sounds normal.  Abdominal: Soft. Bowel sounds are normal.  Neurological: He is alert and oriented to person, place, and time.  Skin: Skin is warm and dry. Capillary refill takes less than 2 seconds.  Psychiatric: He has a normal mood and affect. His behavior is normal.  Nursing note and vitals reviewed.    Musculoskeletal Exam: C-spine limited range of motion he had discomfort with range of motion of his bilateral shoulders he is pain and tenderness on palpation of his left elbow with decreased extension. He had swelling over bilateral wrist joints bilateral PIP joints with tenderness. Both knee joints were warm and swollen. Warmth and swelling over bilateral ankle joints was noted.  CDAI Exam: CDAI Homunculus Exam:   Tenderness:  RUE: glenohumeral and wrist LUE: glenohumeral, ulnohumeral and radiohumeral and wrist Right hand: 2nd PIP, 3rd PIP, 4th PIP and 5th  PIP Left hand: 2nd PIP, 3rd PIP, 4th PIP and 5th PIP RLE: tibiofemoral and tibiotalar LLE: tibiofemoral and tibiotalar  Swelling:  RUE: wrist LUE: ulnohumeral and radiohumeral and wrist Right hand: 2nd PIP, 3rd PIP, 4th PIP and 5th PIP Left hand: 2nd PIP, 3rd PIP, 4th PIP and 5th PIP RLE: tibiofemoral and tibiotalar LLE: tibiofemoral and tibiotalar  Joint Counts:  CDAI Tender Joint count: 15 CDAI Swollen Joint count: 13  Global Assessments:  Patient Global Assessment: 8 Provider Global Assessment: 8  CDAI Calculated Score: 44    Investigation: No additional findings.  Orders Only on 06/21/2016  Component Date Value Ref Range Status  . WBC 06/21/2016 11.1* 3.8 - 10.8 K/uL Final  . RBC 06/21/2016 4.16* 4.20 - 5.80 MIL/uL Final  . Hemoglobin 06/21/2016 12.7* 13.2 - 17.1 g/dL Final  . HCT 06/21/2016 38.6  38.5 - 50.0 % Final  . MCV 06/21/2016 92.8  80.0 - 100.0 fL Final  .  MCH 06/21/2016 30.5  27.0 - 33.0 pg Final  . MCHC 06/21/2016 32.9  32.0 - 36.0 g/dL Final  . RDW 06/21/2016 13.2  11.0 - 15.0 % Final  . Platelets 06/21/2016 308  140 - 400 K/uL Final  . MPV 06/21/2016 10.0  7.5 - 12.5 fL Final  . Neutro Abs 06/21/2016 8547* 1,500 - 7,800 cells/uL Final  . Lymphs Abs 06/21/2016 1554  850 - 3,900 cells/uL Final  . Monocytes Absolute 06/21/2016 777  200 - 950 cells/uL Final  . Eosinophils Absolute 06/21/2016 111  15 - 500 cells/uL Final  . Basophils Absolute 06/21/2016 111  0 - 200 cells/uL Final  . Neutrophils Relative % 06/21/2016 77  % Final  . Lymphocytes Relative 06/21/2016 14  % Final  . Monocytes Relative 06/21/2016 7  % Final  . Eosinophils Relative 06/21/2016 1  % Final  . Basophils Relative 06/21/2016 1  % Final  . Smear Review 06/21/2016 Criteria for review not met   Final  . Sodium 06/21/2016 133* 135 - 146 mmol/L Final  . Potassium 06/21/2016 4.6  3.5 - 5.3 mmol/L Final  . Chloride 06/21/2016 99  98 - 110 mmol/L Final  . CO2 06/21/2016 26  20 - 31 mmol/L  Final  . Glucose, Bld 06/21/2016 331* 65 - 99 mg/dL Final  . BUN 06/21/2016 27* 7 - 25 mg/dL Final  . Creat 06/21/2016 1.33* 0.70 - 1.25 mg/dL Final   Comment:   For patients > or = 70 years of age: The upper reference limit for Creatinine is approximately 13% higher for people identified as African-American.     . Total Bilirubin 06/21/2016 0.5  0.2 - 1.2 mg/dL Final  . Alkaline Phosphatase 06/21/2016 81  40 - 115 U/L Final  . AST 06/21/2016 21  10 - 35 U/L Final  . ALT 06/21/2016 23  9 - 46 U/L Final  . Total Protein 06/21/2016 6.4  6.1 - 8.1 g/dL Final  . Albumin 06/21/2016 4.0  3.6 - 5.1 g/dL Final  . Calcium 06/21/2016 9.5  8.6 - 10.3 mg/dL Final  . GFR, Est African American 06/21/2016 63  >=60 mL/min Final  . GFR, Est Non African American 06/21/2016 54* >=60 mL/min Final  Office Visit on 05/24/2016  Component Date Value Ref Range Status  . Sodium 05/24/2016 136  135 - 146 mmol/L Final  . Potassium 05/24/2016 4.1  3.5 - 5.3 mmol/L Final  . Chloride 05/24/2016 97* 98 - 110 mmol/L Final  . CO2 05/24/2016 27  20 - 31 mmol/L Final  . Glucose, Bld 05/24/2016 251* 65 - 99 mg/dL Final  . BUN 05/24/2016 34* 7 - 25 mg/dL Final  . Creat 05/24/2016 1.58* 0.70 - 1.25 mg/dL Final   Comment:   For patients > or = 70 years of age: The upper reference limit for Creatinine is approximately 13% higher for people identified as African-American.     . Total Bilirubin 05/24/2016 0.7  0.2 - 1.2 mg/dL Final  . Alkaline Phosphatase 05/24/2016 82  40 - 115 U/L Final  . AST 05/24/2016 18  10 - 35 U/L Final  . ALT 05/24/2016 18  9 - 46 U/L Final  . Total Protein 05/24/2016 6.8  6.1 - 8.1 g/dL Final  . Albumin 05/24/2016 4.0  3.6 - 5.1 g/dL Final  . Calcium 05/24/2016 9.6  8.6 - 10.3 mg/dL Final  . GFR, Est African American 05/24/2016 51* >=60 mL/min Final  . GFR, Est Non African American 05/24/2016 44* >=  60 mL/min Final  . Color, Urine 05/24/2016 DARK YELLOW  YELLOW Final  . APPearance  05/24/2016 CLEAR  CLEAR Final  . Specific Gravity, Urine 05/24/2016 1.025  1.001 - 1.035 Final  . pH 05/24/2016 5.0  5.0 - 8.0 Final  . Glucose, UA 05/24/2016 NEGATIVE  NEGATIVE Final  . Bilirubin Urine 05/24/2016 NEGATIVE  NEGATIVE Final  . Ketones, ur 05/24/2016 TRACE* NEGATIVE Final  . Hgb urine dipstick 05/24/2016 NEGATIVE  NEGATIVE Final  . Protein, ur 05/24/2016 1+* NEGATIVE Final  . Nitrite 05/24/2016 NEGATIVE  NEGATIVE Final  . Leukocytes, UA 05/24/2016 NEGATIVE  NEGATIVE Final  . Hep A IgM 05/24/2016 NON REACTIVE  NON REACTIVE Final  . Hep B C IgM 05/24/2016 NON REACTIVE  NON REACTIVE Final   Comment: High levels of Hepatitis B Core IgM antibody are detectable during the acute stage of Hepatitis B. This antibody is used to differentiate current from past HBV infection.     Marland Kitchen HCV Ab 05/24/2016 NEGATIVE  NEGATIVE Final  . Interferon Gamma Release Assay 05/24/2016 NEGATIVE  NEGATIVE Final  . Quantiferon Nil Value 05/24/2016 0.11  IU/mL Final  . Mitogen-Nil 05/24/2016 >10.00  IU/mL Final  . Quantiferon Tb Ag Minus Nil Value 05/24/2016 0.02  IU/mL Final   Comment:   The Nil tube value is used to determine if the patient has a preexisting immune response which could cause a false-positive reading on the test. In order for a test to be valid, the Nil tube must have a value of less than or equal to 8.0 IU/mL.   The mitogen control tube is used to assure the patient has a healthy immune status and also serves as a control for correct blood handling and incubation. It is used to detect false-negative readings. The mitogen tube must have a gamma interferon value of greater than or equal to 0.5 IU/mL higher than the value of the Nil tube.   The TB antigen tube is coated with the M. tuberculosis specific antigens. For a test to be considered positive, the TB antigen tube value minus the Nil tube value must be greater than or equal to 0.35 IU/mL.   For additional information, please  refer to http://education.questdiagnostics.com/faq/QFT (This link is being provided for informational/educational purposes only.)   . Total Protein, Serum Electrophores* 05/24/2016 6.8  6.1 - 8.1 g/dL Final  . Albumin ELP 05/24/2016 3.9  3.8 - 4.8 g/dL Final  . Alpha-1-Globulin 05/24/2016 0.6* 0.2 - 0.3 g/dL Final  . Alpha-2-Globulin 05/24/2016 0.7  0.5 - 0.9 g/dL Final  . Beta Globulin 05/24/2016 0.5  0.4 - 0.6 g/dL Final  . Beta 2 05/24/2016 0.3  0.2 - 0.5 g/dL Final  . Gamma Globulin 05/24/2016 0.9  0.8 - 1.7 g/dL Final  . Abnormal Protein Band1 05/24/2016 NOT DET  g/dL Final  . SPE Interp. 05/24/2016 SEE NOTE   Final   Comment: One or more serum protein fractions are outside the normal ranges. No abnormal protein bands are apparent. Reviewed by Odis Hollingshead, MD, PhD, FCAP (Electronic Signature on File)   . Abnormal Protein Band2 05/24/2016 NOT DET  g/dL Final  . Abnormal Protein Band3 05/24/2016 NOT DET  g/dL Final  . IgG (Immunoglobin G), Serum 05/24/2016 960  694 - 1,618 mg/dL Final  . IgA 05/24/2016 210  81 - 463 mg/dL Final  . IgM, Serum 05/24/2016 151  48 - 271 mg/dL Final  . HIV 1&2 Ab, 4th Generation 05/24/2016 NONREACTIVE  NONREACTIVE Final   Comment:  HIV-1 antigen and HIV-1/HIV-2 antibodies were not detected.  There is no laboratory evidence of HIV infection.   HIV-1/2 Antibody Diff        Not indicated. HIV-1 RNA, Qual TMA          Not indicated.     PLEASE NOTE: This information has been disclosed to you from records whose confidentiality [place] be protected by state law. If your state requires such protection, then the state law prohibits you from making any further disclosure of the information without the specific written consent of the person to whom it pertains, or as otherwise permitted by law. A general authorization for the release of medical or other information is NOT sufficient for this purpose.   The performance of this assay has not been  clinically validated in patients less than 97 years old.   For additional information please refer to http://education.questdiagn[facility].com/faq/FAQ106.  (This link is being provided for informational/educational purposes only.)     . Hepatitis B Surface Ag 05/24/2016 NEGATIVE  NEGATIVE Final  . TSH 05/24/2016 3.03  0.40 - 4.50 mIU/L Final  . WBC, UA 05/24/2016 0-5  <=5 WBC/HPF Final  . RBC / HPF 05/24/2016 NONE SEEN  <=2 RBC/HPF Final  . Squamous Epithelial / LPF 05/24/2016 0-5  <=5 HPF Final  . Bacteria, UA 05/24/2016 FEW* NONE SEEN HPF Final  . Crystals 05/24/2016 NONE SEEN  NONE SEEN HPF Final  . Casts 05/24/2016 See Below* NONE SEEN LPF Final  . Yeast 05/24/2016 NONE SEEN  NONE SEEN HPF Final  Admission on 05/16/2016, Discharged on 05/16/2016  Component Date Value Ref Range Status  . Sed Rate 05/16/2016 26* 0 - 16 mm/hr Final  . CRP 05/16/2016 3.0* <1.0 mg/dL Final  . WBC 05/16/2016 8.2  4.0 - 10.5 K/uL Final  . RBC 05/16/2016 4.49  4.22 - 5.81 MIL/uL Final  . Hemoglobin 05/16/2016 13.8  13.0 - 17.0 g/dL Final  . HCT 05/16/2016 38.9* 39.0 - 52.0 % Final  . MCV 05/16/2016 86.6  78.0 - 100.0 fL Final  . MCH 05/16/2016 30.7  26.0 - 34.0 pg Final  . MCHC 05/16/2016 35.5  30.0 - 36.0 g/dL Final  . RDW 05/16/2016 13.2  11.5 - 15.5 % Final  . Platelets 05/16/2016 353  150 - 400 K/uL Final  . Neutrophils Relative % 05/16/2016 65  % Final  . Neutro Abs 05/16/2016 5.4  1.7 - 7.7 K/uL Final  . Lymphocytes Relative 05/16/2016 21  % Final  . Lymphs Abs 05/16/2016 1.7  0.7 - 4.0 K/uL Final  . Monocytes Relative 05/16/2016 11  % Final  . Monocytes Absolute 05/16/2016 0.9  0.1 - 1.0 K/uL Final  . Eosinophils Relative 05/16/2016 2  % Final  . Eosinophils Absolute 05/16/2016 0.1  0.0 - 0.7 K/uL Final  . Basophils Relative 05/16/2016 1  % Final  . Basophils Absolute 05/16/2016 0.0  0.0 - 0.1 K/uL Final  . Sodium 05/16/2016 135  135 - 145 mmol/L Final  . Potassium 05/16/2016 3.8  3.5 - 5.1  mmol/L Final  . Chloride 05/16/2016 101  101 - 111 mmol/L Final  . CO2 05/16/2016 25  22 - 32 mmol/L Final  . Glucose, Bld 05/16/2016 189* 65 - 99 mg/dL Final  . BUN 05/16/2016 18  6 - 20 mg/dL Final  . Creatinine, Ser 05/16/2016 0.97  0.61 - 1.24 mg/dL Final  . Calcium 05/16/2016 9.0  8.9 - 10.3 mg/dL Final  . GFR calc non Af Amer 05/16/2016 >60  >  60 mL/min Final  . GFR calc Af Amer 05/16/2016 >60  >60 mL/min Final   Comment: (NOTE) The eGFR has been calculated using the CKD EPI equation. This calculation has not been validated in all clinical situations. eGFR's persistently <60 mL/min signify possible Chronic Kidney Disease.   . Anion gap 05/16/2016 9  5 - 15 Final  . ANA Ab, IFA 05/16/2016 Negative   Final   Comment: (NOTE)                                     Negative   <1:80                                     Borderline  1:80                                     Positive   >1:80 Performed At: [facility] [place], [place] [id number] Lindon Romp MD [id number]:[id number]   . CCP Antibodies IgG/IgA 05/16/2016 >250* 0 - 19 units Final   Comment: (NOTE)                          Negative               <20                          Weak positive      20 - 39                          Moderate positive  40 - 59                          Strong positive        >59 Performed At: [facility] [place], [place] [id number] Lindon Romp MD [id number]   . Rhuematoid fact SerPl-aCnc 05/16/2016 156.6* 0.0 - 13.9 IU/mL Final   Comment: (NOTE) Results confirmed on dilution. Performed At: [facility] [place], [place] [id number] Lindon Romp MD [id number]     No results found.  Speciality Comments: No specialty comments available.    Procedures:  No procedures performed Allergies: Invokana [canagliflozin]; Lipitor [atorvastatin]; Losartan; Vicodin [hydrocodone-acetaminophen]; Codeine; and Roxicodone  [oxycodone]   Assessment / Plan:     Visit Diagnoses: Rheumatoid arthritis involving multiple sites with positive rheumatoid factor (HCC) positive rheumatoid factor positive CCP and elevated sedimentation rate. He is having a flare on prednisone 5 mg by mouth daily. He is also on Arava 20 mg a day which is not very effective for him.  Pain in joint, multiple sites synovitis involving multiple joints  Primary osteoarthritis of right knee with chronic pain  Other fatigue  High risk medication use: He is on Arava 20 mg by mouth daily and prednisone 5 mg by mouth daily. His labs from February 2018 are stable. We had detailed discussion regarding different treatment options and their side effects. Based on his insurance we have limited options. Probably infusion therapy will be more cost effective than subcutaneous injections. Morrie Sheldon could be another option.  In the meantime I'll start him on prednisone 5 mg tablets he will take 4 tablets for one week, 3 tablets for one week then stay on 2 tablets by mouth daily until we find an effective therapy for him. Patient is not interested in infusion therapy he wants to do subcutaneous injections. We discussed different treatment options and their side effects the plan is to apply for Humira for cutaneous .  Status post right partial knee replacement  Swelling of bilateral hand  History of diabetes mellitus: He is been advised to monitor his pressure closely he does have an insulin pump.  History of coronary artery disease   Orders: No orders of the defined types were placed in this encounter.  No orders of the defined types were placed in this encounter.   Face-to-face time spent with patient was 35 minutes. 50% of time was spent in counseling and coordination of care.  Follow-Up Instructions: Return in about 3 months (around 10/03/2016) for Rheumatoid arthritis.   Bo Merino, MD  Note - This record has been created using Editor, commissioning.    Chart creation errors have been sought, but Tillis not always  have been located. Such creation errors do not reflect on  the standard of medical care.

## 2016-07-06 NOTE — Telephone Encounter (Signed)
Patient is experiencing a lot of pain and can barely move his arms and the pain has been getting increasingly worse while tapering off of prednisone. Patient's wife states he is now taking 1 pill and that he flares every time he gets down to one but he cannot keep taking the prednisone for long term due to his sugars. Please call patient's wife Dalene Seltzer about the symptoms that the patient is having.

## 2016-07-06 NOTE — Telephone Encounter (Signed)
Can he come in today at 3:15?

## 2016-07-06 NOTE — Patient Instructions (Signed)
Adalimumab Injection What is this medicine? ADALIMUMAB (a dal AYE mu mab) is used to treat rheumatoid and psoriatic arthritis. It is also used to treat ankylosing spondylitis, Crohn's disease, ulcerative colitis, plaque psoriasis, hidradenitis suppurativa, and uveitis. This medicine Stakes be used for other purposes; ask your health care provider or pharmacist if you have questions. COMMON BRAND NAME(S): CYLTEZO, Humira What should I tell my health care provider before I take this medicine? They need to know if you have any of these conditions: -diabetes -heart disease -hepatitis B or history of hepatitis B infection -immune system problems -infection or history of infections -multiple sclerosis -recently received or scheduled to receive a vaccine -scheduled to have surgery -tuberculosis, a positive skin test for tuberculosis or have recently been in close contact with someone who has tuberculosis -an unusual reaction to adalimumab, other medicines, mannitol, latex, rubber, foods, dyes, or preservatives -pregnant or trying to get pregnant -breast-feeding How should I use this medicine? This medicine is for injection under the skin. You will be taught how to prepare and give this medicine. Use exactly as directed. Take your medicine at regular intervals. Do not take your medicine more often than directed. A special MedGuide will be given to you by the pharmacist with each prescription and refill. Be sure to read this information carefully each time. It is important that you put your used needles and syringes in a special sharps container. Do not put them in a trash can. If you do not have a sharps container, call your pharmacist or healthcare provider to get one. Talk to your pediatrician regarding the use of this medicine in children. While this drug Harms be prescribed for children as young as 2 years for selected conditions, precautions do apply. The manufacturer of the medicine offers free  information to patients and their health care partners. Call 1-800-448-6472 for more information. Overdosage: If you think you have taken too much of this medicine contact a poison control center or emergency room at once. NOTE: This medicine is only for you. Do not share this medicine with others. What if I miss a dose? If you miss a dose, take it as soon as you can. If it is almost time for your next dose, take only that dose. Do not take double or extra doses. Give the next dose when your next scheduled dose is due. Call your doctor or health care professional if you are not sure how to handle a missed dose. What Franklyn interact with this medicine? Do not take this medicine with any of the following medications: -abatacept -anakinra -etanercept -infliximab -live virus vaccines -rilonacept This medicine Rallis also interact with the following medications: -vaccines This list Wolak not describe all possible interactions. Give your health care provider a list of all the medicines, herbs, non-prescription drugs, or dietary supplements you use. Also tell them if you smoke, drink alcohol, or use illegal drugs. Some items Hernandez interact with your medicine. What should I watch for while using this medicine? Visit your doctor or health care professional for regular checks on your progress. Tell your doctor or healthcare professional if your symptoms do not start to get better or if they get worse. You will be tested for tuberculosis (TB) before you start this medicine. If your doctor prescribes any medicine for TB, you should start taking the TB medicine before starting this medicine. Make sure to finish the full course of TB medicine. Call your doctor or health care professional if you get a cold   or other infection while receiving this medicine. Do not treat yourself. This medicine Mittal decrease your body's ability to fight infection. Talk to your doctor about your risk of cancer. You Buckalew be more at risk for  certain types of cancers if you take this medicine. What side effects Reh I notice from receiving this medicine? Side effects that you should report to your doctor or health care professional as soon as possible: -allergic reactions like skin rash, itching or hives, swelling of the face, lips, or tongue -breathing problems -changes in vision -chest pain -fever, chills, or any other sign of infection -numbness or tingling -red, scaly patches or raised bumps on the skin -swelling of the ankles -swollen lymph nodes in the neck, underarm, or groin areas -unexplained weight loss -unusual bleeding or bruising -unusually weak or tired Side effects that usually do not require medical attention (report to your doctor or health care professional if they continue or are bothersome): -headache -nausea -redness, itching, swelling, or bruising at site where injected This list Swatek not describe all possible side effects. Call your doctor for medical advice about side effects. You Mensing report side effects to FDA at 1-800-FDA-1088. Where should I keep my medicine? Keep out of the reach of children. Store in the original container and in the refrigerator between 2 and 8 degrees C (36 and 46 degrees F). Do not freeze. The product Muscarella be stored in a cool carrier with an ice pack, if needed. Protect from light. Throw away any unused medicine after the expiration date. NOTE: This sheet is a summary. It Huisman not cover all possible information. If you have questions about this medicine, talk to your doctor, pharmacist, or health care provider.  2017 Elsevier/Gold Standard (2014-11-18 11:11:43)

## 2016-07-06 NOTE — Progress Notes (Signed)
Pharmacy Note Subjective: Patient presents today to the [facility] to see Dr. Estanislado Pandy.  Had detailed discussion of biologic DMARDs and options for patient.  Patient did not want to try any infusion medications at this time.  Decision was made to apply for Humira.  Patient seen by the pharmacist for counseling on Humira.    Objective: TB Test: negative (05/24/16) Hepatitis panel: negative  (05/24/16) HIV: negative  (05/24/16)  CBC    Component Value Date/Time   WBC 11.1 (H) 06/21/2016 1215   RBC 4.16 (L) 06/21/2016 1215   HGB 12.7 (L) 06/21/2016 1215   HCT 38.6 06/21/2016 1215   PLT 308 06/21/2016 1215   MCV 92.8 06/21/2016 1215   MCH 30.5 06/21/2016 1215   MCHC 32.9 06/21/2016 1215   RDW 13.2 06/21/2016 1215   LYMPHSABS 1,554 06/21/2016 1215   MONOABS 777 06/21/2016 1215   EOSABS 111 06/21/2016 1215   BASOSABS 111 06/21/2016 1215    Assessment/Plan:  Counseled patient that Humira is a TNF blocking agent.  Reviewed Humira dose of 40 mg every other week.  Counseled patient on purpose, proper use, and adverse effects of Humira.  Reviewed the most common adverse effects including infections, headache, and injection site reactions. Discussed that there is the possibility of an increased risk of malignancy but it is not well understood if this increased risk is due to the medication or the disease state.  Advised patient to get yearly dermatology exams due to risk of skin cancer.  Reviewed the importance of regular labs while on Humira therapy.  Advised patient to get standing labs one month after starting Humira then every 3 months.  Counseled patient that Humira should be held prior to scheduled surgery.  Counseled patient to avoid live vaccines while on Humira.  Advised patient to get annual influenza vaccine and the pneumococcal vaccine as needed.  Provided patient with medication education material and answered all questions.  Patient voiced understanding.  Patient consented  to Humira.  Will upload consent into the media tab.  Reviewed storage instructions of Humira.  Advised patient to contact the office to schedule the first Humira shot once Humira is approved by insurance.  Patient voiced understanding.  Assisted patient in applying for the Humira patient assistance program.  Application was completed in office today.  Patient will bring copy of his financial information.  Will submit application once I have financial information.  Will update patient once I know status of the application.     Elisabeth Most, Pharm.D., BCPS Clinical Pharmacist Pager: [phone] Phone: [phone] 07/06/2016 4:58 PM

## 2016-07-07 ENCOUNTER — Telehealth: Payer: Self-pay

## 2016-07-07 ENCOUNTER — Telehealth: Payer: Self-pay | Admitting: Pharmacist

## 2016-07-07 NOTE — Telephone Encounter (Signed)
Spoke with Joel Jones, a representative from [facility] regarding Joel Jones prior authorization. She states that no authorization will be required until Hunn 1, 2018 with his insurance.   Reference number: [id number] Phone number: [phone]  Demetrios Loll, CPhT 9:56 AM

## 2016-07-07 NOTE — Telephone Encounter (Signed)
Patient's wife brought a copy of patient's financial information by the office.  Application for Humira patient assistance program was submitted.  Will update patient once I know status.     Elisabeth Most, Pharm.D., BCPS, CPP Clinical Pharmacist Pager: [phone] Phone: [phone] 07/07/2016 2:23 PM

## 2016-07-10 ENCOUNTER — Telehealth: Payer: Self-pay | Admitting: Pharmacist

## 2016-07-10 NOTE — Telephone Encounter (Signed)
Received fax from [facility].  Patient has been approved for Humira through 05/14/2017.    I called patient to inform him.  Patient's wife answered the phone and verified patient's DOB.  Reviewed that the program will be sending them the Humira prescription.  Reviewed appropriate storage of Humira.  Advised that they will need nursing visit for initial Humira injection.  Discussed with Dr. Estanislado Pandy.  Will initiate Humira using sample medication while patient is waiting for his medication to be delivered.  Nursing visit scheduled for 07/12/16 at 10:00 AM.     Elisabeth Most, Pharm.D., BCPS, CPP Clinical Pharmacist Pager: [phone] Phone: [phone] 07/10/2016 10:55 AM

## 2016-07-12 ENCOUNTER — Ambulatory Visit (INDEPENDENT_AMBULATORY_CARE_PROVIDER_SITE_OTHER): Payer: Medicare Other | Admitting: *Deleted

## 2016-07-12 VITALS — BP 121/62 | HR 61

## 2016-07-12 DIAGNOSIS — M0579 Rheumatoid arthritis with rheumatoid factor of multiple sites without organ or systems involvement: Secondary | ICD-10-CM | POA: Diagnosis not present

## 2016-07-12 DIAGNOSIS — Z79899 Other long term (current) drug therapy: Secondary | ICD-10-CM | POA: Diagnosis not present

## 2016-07-12 LAB — CBC WITH DIFFERENTIAL/PLATELET
BASOS PCT: 1 %
Basophils Absolute: 79 cells/uL (ref 0–200)
Eosinophils Absolute: 158 cells/uL (ref 15–500)
Eosinophils Relative: 2 %
HEMATOCRIT: 41.7 % (ref 38.5–50.0)
Hemoglobin: 13.8 g/dL (ref 13.2–17.1)
LYMPHS ABS: 2054 {cells}/uL (ref 850–3900)
Lymphocytes Relative: 26 %
MCH: 30.3 pg (ref 27.0–33.0)
MCHC: 33.1 g/dL (ref 32.0–36.0)
MCV: 91.4 fL (ref 80.0–100.0)
MONO ABS: 948 {cells}/uL (ref 200–950)
MONOS PCT: 12 %
MPV: 10.3 fL (ref 7.5–12.5)
Neutro Abs: 4661 cells/uL (ref 1500–7800)
Neutrophils Relative %: 59 %
PLATELETS: 310 10*3/uL (ref 140–400)
RBC: 4.56 MIL/uL (ref 4.20–5.80)
RDW: 13.2 % (ref 11.0–15.0)
WBC: 7.9 10*3/uL (ref 3.8–10.8)

## 2016-07-12 MED ORDER — ADALIMUMAB 40 MG/0.8ML ~~LOC~~ PSKT
40.0000 mg | PREFILLED_SYRINGE | Freq: Once | SUBCUTANEOUS | Status: AC
  Administered 2016-07-12: 40 mg via SUBCUTANEOUS

## 2016-07-12 NOTE — Progress Notes (Signed)
Patient in office today for initial Humira injection. Patient given Humira injection and tolerated well. Patient was observed in office for 30 minutes after administration of injection for adverse reactions. No adverse reactions noted.   Administrations This Visit    Adalimumab PSKT 40 mg    Admin Date 07/12/2016 Action Given Dose 40 mg Route Subcutaneous Administered By Carole Binning, LPN

## 2016-07-12 NOTE — Patient Instructions (Addendum)
Call the [facility] Patient Assistance Program in one week if you have not heard from them regarding your shipment of Humira.  Their phone number is [phone].    Standing Labs We placed an order today for your standing lab work.    Please come back and get your standing labs in 1 month then every 2 months  We have open lab Monday through Friday from 8:30-11:30 AM and 1:30-4 PM at the office of Dr. Tresa Moore, PA.   The office is located at [address] [place], [place], [place] [address] No appointment is necessary.   Labs are drawn by [facility].  You Quinney receive a bill from [facility] for your lab work.     Adalimumab Injection What is this medicine? ADALIMUMAB (a dal AYE mu mab) is used to treat rheumatoid and psoriatic arthritis. It is also used to treat ankylosing spondylitis, Crohn's disease, ulcerative colitis, plaque psoriasis, hidradenitis suppurativa, and uveitis. This medicine Roseland be used for other purposes; ask your health care provider or pharmacist if you have questions. COMMON BRAND NAME(S): CYLTEZO, Humira What should I tell my health care provider before I take this medicine? They need to know if you have any of these conditions: -diabetes -heart disease -hepatitis B or history of hepatitis B infection -immune system problems -infection or history of infections -multiple sclerosis -recently received or scheduled to receive a vaccine -scheduled to have surgery -tuberculosis, a positive skin test for tuberculosis or have recently been in close contact with someone who has tuberculosis -an unusual reaction to adalimumab, other medicines, mannitol, latex, rubber, foods, dyes, or preservatives -pregnant or trying to get pregnant -breast-feeding How should I use this medicine? This medicine is for injection under the skin. You will be taught how to prepare and give this medicine. Use exactly as directed. Take your medicine at regular intervals. Do not  take your medicine more often than directed. A special MedGuide will be given to you by the pharmacist with each prescription and refill. Be sure to read this information carefully each time. It is important that you put your used needles and syringes in a special sharps container. Do not put them in a trash can. If you do not have a sharps container, call your pharmacist or healthcare provider to get one. Talk to your pediatrician regarding the use of this medicine in children. While this drug Spinola be prescribed for children as young as 2 years for selected conditions, precautions do apply. The manufacturer of the medicine offers free information to patients and their health care partners. Call 716-255-6229 for more information. Overdosage: If you think you have taken too much of this medicine contact a poison control center or emergency room at once. NOTE: This medicine is only for you. Do not share this medicine with others. What if I miss a dose? If you miss a dose, take it as soon as you can. If it is almost time for your next dose, take only that dose. Do not take double or extra doses. Give the next dose when your next scheduled dose is due. Call your doctor or health care professional if you are not sure how to handle a missed dose. What Durkin interact with this medicine? Do not take this medicine with any of the following medications: -abatacept -anakinra -etanercept -infliximab -live virus vaccines -rilonacept This medicine Rogan also interact with the following medications: -vaccines This list Nilan not describe all possible interactions. Give your health care provider a list of  all the medicines, herbs, non-prescription drugs, or dietary supplements you use. Also tell them if you smoke, drink alcohol, or use illegal drugs. Some items Ofarrell interact with your medicine. What should I watch for while using this medicine? Visit your doctor or health care professional for regular checks on your  progress. Tell your doctor or healthcare professional if your symptoms do not start to get better or if they get worse. You will be tested for tuberculosis (TB) before you start this medicine. If your doctor prescribes any medicine for TB, you should start taking the TB medicine before starting this medicine. Make sure to finish the full course of TB medicine. Call your doctor or health care professional if you get a cold or other infection while receiving this medicine. Do not treat yourself. This medicine Bearden decrease your body's ability to fight infection. Talk to your doctor about your risk of cancer. You Holck be more at risk for certain types of cancers if you take this medicine. What side effects Aymond I notice from receiving this medicine? Side effects that you should report to your doctor or health care professional as soon as possible: -allergic reactions like skin rash, itching or hives, swelling of the face, lips, or tongue -breathing problems -changes in vision -chest pain -fever, chills, or any other sign of infection -numbness or tingling -red, scaly patches or raised bumps on the skin -swelling of the ankles -swollen lymph nodes in the neck, underarm, or groin areas -unexplained weight loss -unusual bleeding or bruising -unusually weak or tired Side effects that usually do not require medical attention (report to your doctor or health care professional if they continue or are bothersome): -headache -nausea -redness, itching, swelling, or bruising at site where injected This list Parlett not describe all possible side effects. Call your doctor for medical advice about side effects. You Eldredge report side effects to FDA at 1-800-FDA-1088. Where should I keep my medicine? Keep out of the reach of children. Store in the original container and in the refrigerator between 2 and 8 degrees C (36 and 46 degrees F). Do not freeze. The product Hinchcliff be stored in a cool carrier with an ice pack, if  needed. Protect from light. Throw away any unused medicine after the expiration date. NOTE: This sheet is a summary. It Whorley not cover all possible information. If you have questions about this medicine, talk to your doctor, pharmacist, or health care provider.  2018 Elsevier/Gold Standard (2014-11-18 11:11:43)

## 2016-07-12 NOTE — Progress Notes (Signed)
Pharmacy Note  Subjective:   Patient is being initiated on Humira.  Patient was previously counseled extensively on Humira on 07/06/16 and consented to initiation of Humira at that time.  Patient presents to clinic today to receive the first dose of Humira.     Objective: CMP     Component Value Date/Time   NA 133 (L) 06/21/2016 1215   K 4.6 06/21/2016 1215   CL 99 06/21/2016 1215   CO2 26 06/21/2016 1215   GLUCOSE 331 (H) 06/21/2016 1215   BUN 27 (H) 06/21/2016 1215   CREATININE 1.33 (H) 06/21/2016 1215   CALCIUM 9.5 06/21/2016 1215   PROT 6.4 06/21/2016 1215   ALBUMIN 4.0 06/21/2016 1215   AST 21 06/21/2016 1215   ALT 23 06/21/2016 1215   ALKPHOS 81 06/21/2016 1215   BILITOT 0.5 06/21/2016 1215   GFRNONAA 54 (L) 06/21/2016 1215   GFRAA 63 06/21/2016 1215   CBC    Component Value Date/Time   WBC 11.1 (H) 06/21/2016 1215   RBC 4.16 (L) 06/21/2016 1215   HGB 12.7 (L) 06/21/2016 1215   HCT 38.6 06/21/2016 1215   PLT 308 06/21/2016 1215   MCV 92.8 06/21/2016 1215   MCH 30.5 06/21/2016 1215   MCHC 32.9 06/21/2016 1215   RDW 13.2 06/21/2016 1215   LYMPHSABS 1,554 06/21/2016 1215   MONOABS 777 06/21/2016 1215   EOSABS 111 06/21/2016 1215   BASOSABS 111 06/21/2016 1215   TB Gold: negative (05/24/16)  Assessment/Plan:  Patient received first injection of Humira in clinic today.  Patient was monitored for 30 minutes post injection.  No injection site reaction noted.  Patient will need standing lab orders in one month.  Provided patient with standing lab instructions.  Counseled patient on how to order Humira prescription from the [facility] patient assistance program.  We called the program today and spoke to [place].  He confirmed patient is approved, and the prescription is processing.  Patient should get a call in 3 to 4 days to schedule delivery of the medication.  I advised patient to call the program next week if he has not heard from them.    Patient is also taking leflunomide  20 mg daily.  Noted patient has not had standing labs since increasing leflunomide dose to 20 mg daily approximately 3 weeks ago.  Patient is due for standing labs today.    Patient to follow up on 08/02/16 as scheduled.    Elisabeth Most, Pharm.D., BCPS Clinical Pharmacist Pager: [phone] Phone: [phone] 07/12/2016 10:38 AM

## 2016-07-13 LAB — COMPLETE METABOLIC PANEL WITH GFR
ALT: 22 U/L (ref 9–46)
AST: 20 U/L (ref 10–35)
Albumin: 3.8 g/dL (ref 3.6–5.1)
Alkaline Phosphatase: 70 U/L (ref 40–115)
BUN: 20 mg/dL (ref 7–25)
CHLORIDE: 97 mmol/L — AB (ref 98–110)
CO2: 22 mmol/L (ref 20–31)
CREATININE: 1.35 mg/dL — AB (ref 0.70–1.25)
Calcium: 9.2 mg/dL (ref 8.6–10.3)
GFR, Est African American: 61 mL/min (ref >=60)
GFR, Est Non African American: 53 mL/min — ABNORMAL LOW (ref >=60)
Glucose, Bld: 228 mg/dL — ABNORMAL HIGH (ref 65–99)
Potassium: 3.5 mmol/L (ref 3.5–5.3)
Sodium: 138 mmol/L (ref 135–146)
TOTAL PROTEIN: 6.2 g/dL (ref 6.1–8.1)
Total Bilirubin: 0.6 mg/dL (ref 0.2–1.2)

## 2016-07-13 NOTE — Progress Notes (Signed)
stable °

## 2016-07-26 NOTE — Progress Notes (Signed)
Office Visit Note  Patient: Joel Weedon Minnifield Sr.             Date of Birth: [birth year]           MRN: [id number]             PCP: [name], MD Referring: [name], MD Visit Date: 08/02/2016 Occupation: '@GUAROCC' @    Subjective:  Pain in left wrist and left elbow   History of Present Illness: Joel Maysonet Panas Sr. is a 71 y.o. male with sero positive rheumatoid arthritis. He's been on Humira for approximately 2 months weeks now. He is also on tapering schedule of prednisone. He is currently on prednisone 10 mg by mouth daily. He has noticed improvement in his symptoms. He has some residual discomfort in his left wrist and left elbow. He denies any joint swelling. He denies any side effects from the current medications. He is not sleeping well on prednisone. He also has nocturnal pain in his knee joints.  Activities of Daily Living:  Patient reports morning stiffness for30 minutes.   Patient Reports nocturnal pain.  Difficulty dressing/grooming: Denies Difficulty climbing stairs: Reports Difficulty getting out of chair: Denies Difficulty using hands for taps, buttons, cutlery, and/or writing: Denies   Review of Systems  Constitutional: Positive for fatigue. Negative for night sweats and weakness ( ).  HENT: Negative for mouth sores, mouth dryness and nose dryness.   Eyes: Negative for redness and dryness.  Respiratory: Negative for shortness of breath and difficulty breathing.   Cardiovascular: Negative for chest pain, palpitations, hypertension, irregular heartbeat and swelling in legs/feet.  Gastrointestinal: Negative for constipation and diarrhea.  Endocrine: Negative for increased urination.  Musculoskeletal: Positive for arthralgias, joint pain and morning stiffness. Negative for joint swelling, myalgias, muscle weakness, muscle tenderness and myalgias.  Skin: Negative for color change, rash, hair loss, nodules/bumps, skin tightness, ulcers and sensitivity to sunlight.    Allergic/Immunologic: Negative for susceptible to infections.  Neurological: Negative for dizziness, fainting, memory loss and night sweats.  Hematological: Negative for swollen glands.  Psychiatric/Behavioral: Positive for sleep disturbance. Negative for depressed mood. The patient is not nervous/anxious.     PMFS History:  Patient Active Problem List   Diagnosis Date Noted  . High risk medication use 07/06/2016  . History of diabetes mellitus 07/06/2016  . History of coronary artery disease 07/06/2016  . Rheumatoid arthritis involving multiple sites with positive rheumatoid factor ([place]) 06/02/2016  . Other fatigue 06/02/2016  . Contracture, elbow, left 06/02/2016  . Swelling of left hand 05/01/2016  . Osteoarthritis of right knee 08/27/2015  . Status post right partial knee replacement 08/27/2015  . Essential hypertension 05/11/2015  . Pain in the chest   . Right groin pain 08/15/2012  . Chest pain 04/02/2012  . Cerebrovascular disease 04/02/2012  . CAD (coronary artery disease) 04/02/2012  . Diabetes mellitus without complication ([place])   . Dizziness   . Sinus bradycardia   . Hyperlipidemia   . Edema     Past Medical History:  Diagnosis Date  . Anemia   . Arthritis    "all over"  . Bradycardia   . Chronic bronchitis ([place])    "get it just about q yr" (03/17/2014)  . Daily headache    "here lately" (03/17/2014); relates to sinuses  . GERD (gastroesophageal reflux disease)   . Hard of hearing    hearing aides bilat  . Hiatal hernia   . History of blood transfusion 2008   "related to  OR"  . History of kidney stones   . Hyperlipidemia   . Hypertension   . Pneumonia 1972 X 1  . Type II diabetes mellitus ([place])   . Wears glasses     Family History  Problem Relation Age of Onset  . Cancer Father     prostate  . Heart disease      No family history   Past Surgical History:  Procedure Laterality Date  . broken finger      surgical repaired left hand 2nd finger    . cyst removed      per left knee/posteriorly  . CYSTECTOMY Left 2009   "behind knee"  . INGUINAL HERNIA REPAIR Right ?2010  . JOINT REPLACEMENT    . KNEE ARTHROSCOPY Bilateral 1980's  . PARTIAL KNEE ARTHROPLASTY Right 08/27/2015   Procedure: RIGHT UNICOMPARTMENTAL KNEE ARTHROPLASTY;  Surgeon: Mcarthur Rossetti, MD;  Location: [facility];  Service: Orthopedics;  Laterality: Right;  . REVISION TOTAL KNEE ARTHROPLASTY Left 2008  . TOTAL KNEE ARTHROPLASTY Left 2003   Social History   Social History Narrative   Patient lives at home with family.   Caffeine Use: occasionally     Objective: Vital Signs: BP 124/78   Pulse 72   Resp 14   Wt 202 lb (91.6 kg)   BMI 32.60 kg/m    Physical Exam  Constitutional: He is oriented to person, place, and time. He appears well-developed and well-nourished.  HENT:  Head: Normocephalic and atraumatic.  Eyes: Conjunctivae and EOM are normal. Pupils are equal, round, and reactive to light.  Neck: Normal range of motion. Neck supple.  Cardiovascular: Normal rate, regular rhythm and normal heart sounds.   Pulmonary/Chest: Effort normal and breath sounds normal.  Abdominal: Soft. Bowel sounds are normal.  Neurological: He is alert and oriented to person, place, and time.  Skin: Skin is warm and dry. Capillary refill takes less than 2 seconds.  Psychiatric: He has a normal mood and affect. His behavior is normal.  Nursing note and vitals reviewed.    Musculoskeletal Exam: C-spine and thoracic lumbar spine good range of motion. Shoulder joints are good range of motion. He has left elbow joint contracture with some discomfort with no tenderness on examination. Wrist joint MCPs PIPs DIPs with good range of motion with no synovitis. Hip joints are good range of motion. He has a right partial knee replacement and left total knee replacement which appears to be doing well. Ankle joints MTPs PIPs had no discomfort.  CDAI Exam: CDAI Homunculus Exam:    Tenderness:  LUE: ulnohumeral and radiohumeral and wrist  Joint Counts:  CDAI Tender Joint count: 2 CDAI Swollen Joint count: 0  Global Assessments:  Patient Global Assessment: 2 Provider Global Assessment: 2  CDAI Calculated Score: 6    Investigation: No additional findings. Clinical Support on 07/12/2016  Component Date Value Ref Range Status  . WBC 07/12/2016 7.9  3.8 - 10.8 K/uL Final  . RBC 07/12/2016 4.56  4.20 - 5.80 MIL/uL Final  . Hemoglobin 07/12/2016 13.8  13.2 - 17.1 g/dL Final  . HCT 07/12/2016 41.7  38.5 - 50.0 % Final  . MCV 07/12/2016 91.4  80.0 - 100.0 fL Final  . MCH 07/12/2016 30.3  27.0 - 33.0 pg Final  . MCHC 07/12/2016 33.1  32.0 - 36.0 g/dL Final  . RDW 07/12/2016 13.2  11.0 - 15.0 % Final  . Platelets 07/12/2016 310  140 - 400 K/uL Final  . MPV 07/12/2016 10.3  7.5 - 12.5 fL Final  . Neutro Abs 07/12/2016 4661  1,500 - 7,800 cells/uL Final  . Lymphs Abs 07/12/2016 2054  850 - 3,900 cells/uL Final  . Monocytes Absolute 07/12/2016 948  200 - 950 cells/uL Final  . Eosinophils Absolute 07/12/2016 158  15 - 500 cells/uL Final  . Basophils Absolute 07/12/2016 79  0 - 200 cells/uL Final  . Neutrophils Relative % 07/12/2016 59  % Final  . Lymphocytes Relative 07/12/2016 26  % Final  . Monocytes Relative 07/12/2016 12  % Final  . Eosinophils Relative 07/12/2016 2  % Final  . Basophils Relative 07/12/2016 1  % Final  . Smear Review 07/12/2016 Criteria for review not met   Final  . Sodium 07/12/2016 138  135 - 146 mmol/L Final  . Potassium 07/12/2016 3.5  3.5 - 5.3 mmol/L Final  . Chloride 07/12/2016 97* 98 - 110 mmol/L Final  . CO2 07/12/2016 22  20 - 31 mmol/L Final  . Glucose, Bld 07/12/2016 228* 65 - 99 mg/dL Final  . BUN 07/12/2016 20  7 - 25 mg/dL Final  . Creat 07/12/2016 1.35* 0.70 - 1.25 mg/dL Final   Comment:   For patients > or = 70 years of age: The upper reference limit for Creatinine is approximately 13% higher for people identified  as African-American.     . Total Bilirubin 07/12/2016 0.6  0.2 - 1.2 mg/dL Final  . Alkaline Phosphatase 07/12/2016 70  40 - 115 U/L Final  . AST 07/12/2016 20  10 - 35 U/L Final  . ALT 07/12/2016 22  9 - 46 U/L Final  . Total Protein 07/12/2016 6.2  6.1 - 8.1 g/dL Final  . Albumin 07/12/2016 3.8  3.6 - 5.1 g/dL Final  . Calcium 07/12/2016 9.2  8.6 - 10.3 mg/dL Final  . GFR, Est African American 07/12/2016 61  >=60 mL/min Final  . GFR, Est Non African American 07/12/2016 53* >=60 mL/min Final     Imaging: No results found.  Speciality Comments: No specialty comments available.    Procedures:  No procedures performed Allergies: Invokana [canagliflozin]; Lipitor [atorvastatin]; Losartan; Vicodin [hydrocodone-acetaminophen]; Codeine; and Roxicodone [oxycodone]   Assessment / Plan:     Visit Diagnoses: Rheumatoid arthritis - +RF, +antiCCP, Elevated ESR and oriented history much better on Humira and Arava combination. He has no synovitis on examination today. He is still on prednisone 5 mg by mouth daily we will try to taper it to 2.5 mg for the next few days and then he will discontinue.  High risk medication use - Arava 20 mg po qd, Humira 40 mg subcutaneous every other week - Plan: CBC with Differential/Platelet, COMPLETE METABOLIC PANEL WITH GFR will be today, then 2 months and then every 3 months to monitor for drug toxicity  Contracture, elbow, left: According to patient the contracture has been there for a while. I plan to injected with cortisone next visit.  Status post right partial knee replacement: some warmth  H/O total knee replacement, left: Doing well  Other fatigue  History of hypertension: Well controlled and  History of diabetes mellitus: According to patient the blood sugars are better since he has reduced his prednisone dose.  History of coronary artery disease  High risk medication use - Plan: CBC with Differential/Platelet, COMPLETE METABOLIC PANEL WITH  GFR   Association of heart disease with rheumatoid arthritis was discussed. Need to monitor blood pressure, cholesterol, and to exercise 30-60 minutes on daily basis was discussed. Poor dental  hygiene can be a predisposing factor for rheumatoid arthritis. Good dental hygiene was discussed.  Orders: No orders of the defined types were placed in this encounter.  No orders of the defined types were placed in this encounter.   Face-to-face time spent with patient was 30 minutes. 50% of time was spent in counseling and coordination of care.  Follow-Up Instructions: Return in about 3 months (around 11/02/2016) for Rheumatoid arthritis.   Bo Merino, MD  Note - This record has been created using Editor, commissioning.  Chart creation errors have been sought, but [place] not always  have been located. Such creation errors do not reflect on  the standard of medical care.

## 2016-08-02 ENCOUNTER — Encounter: Payer: Self-pay | Admitting: Rheumatology

## 2016-08-02 ENCOUNTER — Other Ambulatory Visit: Payer: Self-pay | Admitting: Radiology

## 2016-08-02 ENCOUNTER — Ambulatory Visit (INDEPENDENT_AMBULATORY_CARE_PROVIDER_SITE_OTHER): Payer: Medicare Other | Admitting: Rheumatology

## 2016-08-02 VITALS — BP 124/78 | HR 72 | Resp 14 | Wt 202.0 lb

## 2016-08-02 DIAGNOSIS — I2583 Coronary atherosclerosis due to lipid rich plaque: Secondary | ICD-10-CM

## 2016-08-02 DIAGNOSIS — M0579 Rheumatoid arthritis with rheumatoid factor of multiple sites without organ or systems involvement: Secondary | ICD-10-CM

## 2016-08-02 DIAGNOSIS — Z8679 Personal history of other diseases of the circulatory system: Secondary | ICD-10-CM

## 2016-08-02 DIAGNOSIS — I2510 Atherosclerotic heart disease of native coronary artery without angina pectoris: Secondary | ICD-10-CM

## 2016-08-02 DIAGNOSIS — Z79899 Other long term (current) drug therapy: Secondary | ICD-10-CM | POA: Diagnosis not present

## 2016-08-02 DIAGNOSIS — Z96652 Presence of left artificial knee joint: Secondary | ICD-10-CM

## 2016-08-02 DIAGNOSIS — Z96651 Presence of right artificial knee joint: Secondary | ICD-10-CM

## 2016-08-02 DIAGNOSIS — M24522 Contracture, left elbow: Secondary | ICD-10-CM | POA: Diagnosis not present

## 2016-08-02 DIAGNOSIS — Z8639 Personal history of other endocrine, nutritional and metabolic disease: Secondary | ICD-10-CM

## 2016-08-02 DIAGNOSIS — R5383 Other fatigue: Secondary | ICD-10-CM | POA: Diagnosis not present

## 2016-08-02 LAB — COMPLETE METABOLIC PANEL WITH GFR
ALT: 22 U/L (ref 9–46)
AST: 20 U/L (ref 10–35)
Albumin: 3.6 g/dL (ref 3.6–5.1)
Alkaline Phosphatase: 66 U/L (ref 40–115)
BILIRUBIN TOTAL: 0.5 mg/dL (ref 0.2–1.2)
BUN: 21 mg/dL (ref 7–25)
CO2: 29 mmol/L (ref 20–31)
Calcium: 8.9 mg/dL (ref 8.6–10.3)
Chloride: 100 mmol/L (ref 98–110)
Creat: 1.13 mg/dL (ref 0.70–1.25)
GFR, EST AFRICAN AMERICAN: 76 mL/min (ref >=60)
GFR, EST NON AFRICAN AMERICAN: 66 mL/min (ref >=60)
Glucose, Bld: 247 mg/dL — ABNORMAL HIGH (ref 65–99)
Potassium: 3.8 mmol/L (ref 3.5–5.3)
Sodium: 138 mmol/L (ref 135–146)
Total Protein: 5.8 g/dL — ABNORMAL LOW (ref 6.1–8.1)

## 2016-08-02 LAB — CBC WITH DIFFERENTIAL/PLATELET
Basophils Absolute: 136 cells/uL (ref 0–200)
Basophils Relative: 2 %
EOS ABS: 340 {cells}/uL (ref 15–500)
Eosinophils Relative: 5 %
HCT: 39.8 % (ref 38.5–50.0)
Hemoglobin: 13.2 g/dL (ref 13.2–17.1)
LYMPHS ABS: 1428 {cells}/uL (ref 850–3900)
LYMPHS PCT: 21 %
MCH: 30.9 pg (ref 27.0–33.0)
MCHC: 33.2 g/dL (ref 32.0–36.0)
MCV: 93.2 fL (ref 80.0–100.0)
MONOS PCT: 16 %
MPV: 10.6 fL (ref 7.5–12.5)
Monocytes Absolute: 1088 cells/uL — ABNORMAL HIGH (ref 200–950)
NEUTROS ABS: 3808 {cells}/uL (ref 1500–7800)
NEUTROS PCT: 56 %
PLATELETS: 243 10*3/uL (ref 140–400)
RBC: 4.27 MIL/uL (ref 4.20–5.80)
RDW: 13.2 % (ref 11.0–15.0)
WBC: 6.8 10*3/uL (ref 3.8–10.8)

## 2016-08-02 NOTE — Progress Notes (Signed)
Rheumatology Medication Review by a Pharmacist Does the patient feel that his/her medications are working for him/her?  Yes - Patient reports improvement in his symptoms since initiation of Humira.  He previously flared when coming off prednisone, but now reports he has been able to reduce his prednisone dose without flaring.  He complains of some swelling in his left wrist today.   Has the patient been experiencing any side effects to the medications prescribed?  No - patient reports some burning at the injection site but reports it is tolerable.   Does the patient have any problems obtaining medications?  No - Patient gets his Humira from the [facility] Patient Assistance Program.    Issues to address at subsequent visits: None   Pharmacist comments:  Mr. Donigan is a pleasant 71 yo M who presents for follow up of rheumatoid arthritis.  He is currently taking Humira 40 mg every other week (initiated on 07/12/16), leflunomide 20 mg daily, and prednisone taper.  Patient took prednisone 20 mg daily for one week, then 15 mg daily for one week, then 10 mg daily for one week, then 5 mg daily for one week.  Patient reports he will be done with his prednisone course today.  Patient has not had labs since initiation of Humira.  Will obtain standing labs today.  Patient denies any questions or concerns regarding his medications today.     Elisabeth Most, Pharm.D., BCPS, CPP Clinical Pharmacist Pager: [phone] Phone: [phone] 08/02/2016 9:24 AM

## 2016-08-03 NOTE — Progress Notes (Signed)
Glucose is elevated. Rest the labs are stable. Please notify patient and send results to PCP

## 2016-08-04 ENCOUNTER — Telehealth: Payer: Self-pay | Admitting: Rheumatology

## 2016-08-04 MED ORDER — PREDNISONE 5 MG PO TABS: ORAL_TABLET | ORAL | 0 refills | Status: DC

## 2016-08-04 NOTE — Telephone Encounter (Signed)
Ok to increase Prednisone to 5 mg x 2weeks then taper to 2.5 mgpo qd

## 2016-08-04 NOTE — Telephone Encounter (Signed)
Patient was seen on 08/02/16: "He is still on prednisone 5 mg by mouth daily we will try to taper it to 2.5 mg for the next few days and then he will discontinue."  Patient reports he is now flaring on prednisone 2.5 mg daily.  Humira was initiated on 07/12/16.  Patient is requesting to increase prednisone back to 5 mg daily.

## 2016-08-04 NOTE — Telephone Encounter (Signed)
Patient's wife Dalene Seltzer) calling about the prednisone and backing off of it down to 1/2 tablet.  He was in tremendous pain last night.  Please call and advise. She called asking for the pharmacist

## 2016-08-04 NOTE — Telephone Encounter (Signed)
Spoke to patient's wife and reviewed the new prednisone instructions.  She reports patient will need a refill of prednisone.    Advised patient to continue to monitor his blood sugar closely and follow up closely with his endocrinologist regarding his blood sugar.  Patient voiced understanding.  Advised patient to call us again if he has another flare.     Elisabeth Most, Pharm.D., BCPS, CPP Clinical Pharmacist Pager: [phone] Phone: [phone] 08/04/2016 9:36 AM

## 2016-08-08 ENCOUNTER — Ambulatory Visit (INDEPENDENT_AMBULATORY_CARE_PROVIDER_SITE_OTHER): Payer: Medicare Other | Admitting: Orthopaedic Surgery

## 2016-08-08 DIAGNOSIS — I2510 Atherosclerotic heart disease of native coronary artery without angina pectoris: Secondary | ICD-10-CM

## 2016-08-08 DIAGNOSIS — I2583 Coronary atherosclerosis due to lipid rich plaque: Secondary | ICD-10-CM

## 2016-08-08 DIAGNOSIS — Z96651 Presence of right artificial knee joint: Secondary | ICD-10-CM

## 2016-08-08 NOTE — Progress Notes (Signed)
Joel Jones is now 11 months post a unicompartmental knee replacement of the medial compartment of his right knee. He is seeing a rheumatologist and is on injections due to severe rheumatologic disease. He still having problems with blood sugar control. He is following up at replaced a hyaluronic acid injection in that right knee. He has known arthritic changes in the lateral compartment and patellofemoral joint but it was always medial tenderness. He says right now the knee swells but is not causing any type of pain.  On examination of his right knee there is no redness. There is a mild effusion but his range of motion is full and pain-free. The knee feels ligamentously stable.  He is due a rheumatologic intervention I believe in the next week. For an orthopedic standpoint we'll need to see him back for 3 months. I would like an AP and lateral of his right knee at that visit.

## 2016-08-16 ENCOUNTER — Encounter: Payer: Medicare Other | Attending: Internal Medicine | Admitting: Nutrition

## 2016-08-16 DIAGNOSIS — E119 Type 2 diabetes mellitus without complications: Secondary | ICD-10-CM

## 2016-08-16 NOTE — Progress Notes (Signed)
Joel Jones called and requested a visit with me saying he got a new pump and his meter can not pair with his pump, and the office was not able to download his blood sugar readings so that he could get his readings sent to his pump supply distributor to get his pump supplies. I unpaired his pump and then repaired his new pump to his existing meter remote, and was able to download his meter remote.   His pump was downloaded to [employer] with his new pump's serial number.  The results were put on Dr. Cindra Eves desk. He reports no difficulty giving correction boluses, due to the antiiniflamitory medication he is put on for his newly diagnosed arthritis.

## 2016-08-16 NOTE — Patient Instructions (Signed)
Always tell the doctor's office when you get a new insulin pump.

## 2016-08-21 ENCOUNTER — Other Ambulatory Visit: Payer: Self-pay | Admitting: Rheumatology

## 2016-08-21 NOTE — Telephone Encounter (Signed)
Ok to refill Prednisone. Keep him on 5mg  , 2tabs.#60 tabs rx1.Will consider switching to Enbrel if he does not respond in the next 2weeks.

## 2016-08-21 NOTE — Telephone Encounter (Signed)
Patient advised of new prescription directions and that prescription has been sent to the pharmacy. Patient advised to contact the office if he has another flare or is still having increased pain in the next few weeks. Patient advised Dr. Estanislado Pandy is considering switching him to Enbrel .

## 2016-08-21 NOTE — Telephone Encounter (Signed)
Patient has had 2 bad attacks with his rheumatoid arthritis. Patient is having trouble with his hands, elbows and shoulders. Having trouble dressing himself. Due for next injection on 08/23/16. Patient was down to Prednisone 10 mg and has had to go back up to Prednisone 20 mg.   Last Visit: 08/02/16 Next Visit: 11/02/16   Okay to refill Prednisone?

## 2016-08-21 NOTE — Telephone Encounter (Signed)
Per my last conversation with patient on 08/04/16, patient was instructed to take prednisone 5 mg daily for two weeks then reduce dose to 2.5 mg (1/2 tablet) daily.    I spoke with patient to clarify what dose of prednisone he is taking.  Patient's wife answered the phone.  She reports patient had a flare on 08/11/16 and he increased his prednisone dose to 20 mg daily.  He has taken 20 mg daily since 08/11/16 and reports continued swelling.

## 2016-08-22 ENCOUNTER — Telehealth: Payer: Self-pay | Admitting: Rheumatology

## 2016-08-22 NOTE — Telephone Encounter (Signed)
Patient's wife left a message yesterday in regards to switching his medication.  She would like a call back.  [phone] (Wife)

## 2016-08-22 NOTE — Telephone Encounter (Signed)
Patient's wife states Joel Jones didn't understand the directions given to him on the phone yesterday because he was driving down the road. Explained to Joel Jones that her husband should take Prednisone 5 mg 2 tablets daily and if he has not responded to his medication in the next 2 weeks we are going to consider switching him to Enbrel. Advised her to keep in touch and let us know how he is doing. Joel Jones is due for his next injection of Humira tomorrow.

## 2016-08-31 ENCOUNTER — Ambulatory Visit (INDEPENDENT_AMBULATORY_CARE_PROVIDER_SITE_OTHER): Payer: Self-pay

## 2016-08-31 ENCOUNTER — Ambulatory Visit (INDEPENDENT_AMBULATORY_CARE_PROVIDER_SITE_OTHER): Payer: Medicare Other

## 2016-08-31 ENCOUNTER — Ambulatory Visit (INDEPENDENT_AMBULATORY_CARE_PROVIDER_SITE_OTHER): Payer: Medicare Other | Admitting: Rheumatology

## 2016-08-31 ENCOUNTER — Encounter: Payer: Self-pay | Admitting: Rheumatology

## 2016-08-31 ENCOUNTER — Telehealth: Payer: Self-pay | Admitting: Rheumatology

## 2016-08-31 DIAGNOSIS — E119 Type 2 diabetes mellitus without complications: Secondary | ICD-10-CM

## 2016-08-31 DIAGNOSIS — M542 Cervicalgia: Secondary | ICD-10-CM | POA: Diagnosis not present

## 2016-08-31 DIAGNOSIS — M25512 Pain in left shoulder: Secondary | ICD-10-CM

## 2016-08-31 DIAGNOSIS — E782 Mixed hyperlipidemia: Secondary | ICD-10-CM

## 2016-08-31 DIAGNOSIS — M0579 Rheumatoid arthritis with rheumatoid factor of multiple sites without organ or systems involvement: Secondary | ICD-10-CM | POA: Diagnosis not present

## 2016-08-31 DIAGNOSIS — M24522 Contracture, left elbow: Secondary | ICD-10-CM

## 2016-08-31 DIAGNOSIS — Z79899 Other long term (current) drug therapy: Secondary | ICD-10-CM

## 2016-08-31 DIAGNOSIS — I2583 Coronary atherosclerosis due to lipid rich plaque: Secondary | ICD-10-CM | POA: Diagnosis not present

## 2016-08-31 DIAGNOSIS — M1711 Unilateral primary osteoarthritis, right knee: Secondary | ICD-10-CM | POA: Diagnosis not present

## 2016-08-31 DIAGNOSIS — I2510 Atherosclerotic heart disease of native coronary artery without angina pectoris: Secondary | ICD-10-CM | POA: Diagnosis not present

## 2016-08-31 MED ORDER — LIDOCAINE HCL 1 % IJ SOLN
1.0000 mL | INTRAMUSCULAR | Status: AC | PRN
  Administered 2016-08-31: 1 mL

## 2016-08-31 MED ORDER — TRIAMCINOLONE ACETONIDE 40 MG/ML IJ SUSP
30.0000 mg | INTRAMUSCULAR | Status: AC | PRN
  Administered 2016-08-31: 30 mg via INTRA_ARTICULAR

## 2016-08-31 NOTE — Telephone Encounter (Signed)
Patient's wife called wanting to get her husband in to see Dr. Estanislado Pandy.  He is having severe pain in the base of his neck and into his shoulder.  He is having difficulty lifting his arm.  Not sure if it is the arthritis or if something else is going on.  CB#[phone].  Thank you.

## 2016-08-31 NOTE — Telephone Encounter (Signed)
Contacted patient and he states he is having pain in the left side of his neck and his left shoulder. Patient has been schedule to be seen by Dr. Estanislado Pandy at 3:30 pm on 08/31/16.

## 2016-08-31 NOTE — Progress Notes (Signed)
Office Visit Note  Patient: Joel Jones Vitali Sr.             Date of Birth: Jul 21, 1945           MRN: [id number]             PCP: [name], MD Referring: [name], MD Visit Date: 08/31/2016 Occupation: @GUAROCC @    Subjective:  Pain of the Neck (Popped, now with neck and left shoulder pain ) and Pain of the Left Shoulder   History of Present Illness: Joel Norman Branscom Sr. is a 71 y.o. male with history of rheumatoid arthritis. He was seen on urgent basis today. He states about 10 days ago he started having increased pain and discomfort in his left shoulder and neck area. He states the pain gets worse when he is laying down. He is having difficulty lifting his left arm. He states he felt a pop in the back of his neck about 5 or 6 months ago and at that time he had some discomfort in his neck which gradually resolved. And now the pain is coming back. He is having difficulty lifting his left arm but after lifting his arm to 90 he can move it around without any discomfort. He continues to have some swelling in his hands but is tolerable. He's been taking Arava and Humira on regular basis.  Activities of Daily Living:  Patient reports morning stiffness for 1 hour.   Patient Reports nocturnal pain.  Difficulty dressing/grooming: Denies Difficulty climbing stairs: Denies Difficulty getting out of chair: Denies Difficulty using hands for taps, buttons, cutlery, and/or writing: Reports   Review of Systems  Constitutional: Negative for fatigue, night sweats and weakness ( ).  HENT: Negative for mouth sores, mouth dryness and nose dryness.   Eyes: Negative for redness and dryness.  Respiratory: Negative for shortness of breath and difficulty breathing.   Cardiovascular: Negative for chest pain, palpitations, hypertension, irregular heartbeat and swelling in legs/feet.  Gastrointestinal: Negative for constipation and diarrhea.  Endocrine: Negative for increased urination.  Musculoskeletal:  Positive for arthralgias, joint pain, myalgias, morning stiffness and myalgias. Negative for joint swelling, muscle weakness and muscle tenderness.  Skin: Negative for color change, rash, hair loss, nodules/bumps, skin tightness, ulcers and sensitivity to sunlight.  Allergic/Immunologic: Negative for susceptible to infections.  Neurological: Negative for dizziness, fainting, memory loss and night sweats.  Hematological: Negative for swollen glands.  Psychiatric/Behavioral: Negative for depressed mood and sleep disturbance. The patient is not nervous/anxious.     PMFS History:  Patient Active Problem List   Diagnosis Date Noted  . High risk medication use 07/06/2016  . History of diabetes mellitus 07/06/2016  . History of coronary artery disease 07/06/2016  . Rheumatoid arthritis involving multiple sites with positive rheumatoid factor ([place]) 06/02/2016  . Other fatigue 06/02/2016  . Contracture, elbow, left 06/02/2016  . Swelling of left hand 05/01/2016  . Osteoarthritis of right knee 08/27/2015  . Status post right partial knee replacement 08/27/2015  . Essential hypertension 05/11/2015  . Pain in the chest   . Right groin pain 08/15/2012  . Chest pain 04/02/2012  . Cerebrovascular disease 04/02/2012  . CAD (coronary artery disease) 04/02/2012  . Diabetes mellitus without complication ([place])   . Dizziness   . Sinus bradycardia   . Hyperlipidemia   . Edema     Past Medical History:  Diagnosis Date  . Anemia   . Arthritis    "all over"  . Bradycardia   .  Chronic bronchitis ([place])    "get it just about q yr" (03/17/2014)  . Daily headache    "here lately" (03/17/2014); relates to sinuses  . GERD (gastroesophageal reflux disease)   . Hard of hearing    hearing aides bilat  . Hiatal hernia   . History of blood transfusion 2008   "related to OR"  . History of kidney stones   . Hyperlipidemia   . Hypertension   . Pneumonia 1972 X 1  . Type II diabetes mellitus ([place])   .  Wears glasses     Family History  Problem Relation Age of Onset  . Cancer Father     prostate  . Heart disease      No family history   Past Surgical History:  Procedure Laterality Date  . broken finger      surgical repaired left hand 2nd finger   . cyst removed      per left knee/posteriorly  . CYSTECTOMY Left 2009   "behind knee"  . INGUINAL HERNIA REPAIR Right ?2010  . JOINT REPLACEMENT    . KNEE ARTHROSCOPY Bilateral 1980's  . PARTIAL KNEE ARTHROPLASTY Right 08/27/2015   Procedure: RIGHT UNICOMPARTMENTAL KNEE ARTHROPLASTY;  Surgeon: Mcarthur Rossetti, MD;  Location: [facility];  Service: Orthopedics;  Laterality: Right;  . REVISION TOTAL KNEE ARTHROPLASTY Left 2008  . TOTAL KNEE ARTHROPLASTY Left 2003   Social History   Social History Narrative   Patient lives at home with family.   Caffeine Use: occasionally     Objective: Vital Signs: BP 140/70   Pulse 86   Resp 16   Wt 185 lb (83.9 kg)   BMI 29.86 kg/m    Physical Exam  Constitutional: He is oriented to person, place, and time. He appears well-developed and well-nourished.  HENT:  Head: Normocephalic and atraumatic.  Eyes: Conjunctivae and EOM are normal. Pupils are equal, round, and reactive to light.  Neck: Normal range of motion. Neck supple.  Cardiovascular: Normal rate, regular rhythm and normal heart sounds.   Pulmonary/Chest: Effort normal and breath sounds normal.  Abdominal: Soft. Bowel sounds are normal.  Neurological: He is alert and oriented to person, place, and time.  Skin: Skin is warm and dry. Capillary refill takes less than 2 seconds.  Psychiatric: He has a normal mood and affect. His behavior is normal.  Nursing note and vitals reviewed.    Musculoskeletal Exam: He is painful range of motion of his C-spine especially with the lateral rotation. Thoracic and lumbar spine good range of motion. He has discomfort raising his left arm to 30. He had no synovitis of her wrist joints.  Synovial thickening and mild synovitis was noted over her MCP joints as described below. Hip joints and knee joints are good range of motion. But no swelling.   CDAI Exam: CDAI Homunculus Exam:   Tenderness:  LUE: acromioclavicular Right hand: 2nd MCP, 3rd MCP and 4th MCP Left hand: 2nd MCP, 3rd MCP and 4th MCP  Swelling:  Left hand: 2nd MCP and 3rd MCP  Joint Counts:  CDAI Tender Joint count: 6 CDAI Swollen Joint count: 2  Global Assessments:  Patient Global Assessment: 6 Provider Global Assessment: 6  CDAI Calculated Score: 20    Investigation: No additional findings.   Imaging: Xr Cervical Spine 2 Or 3 Views  Result Date: 08/31/2016 Mild  listhesis is noted between C2 and C3, multilevel spondylosis noted. No subluxation noted at C1-2.  Xr Cervical Spine 2 Or 3 Views  Result Date: 08/31/2016 Multilevel severe spondylosis with narrowing between C3-4, C4-5, C5-6, C6-7. Loss of cervical lordosis was noted.  Xr Shoulder Left  Result Date: 08/31/2016 No glenohumeral joint space narrowing was noted. Before meals joint arthritis was noted. No chondrocalcinosis was noted.   Speciality Comments: No specialty comments available.    Procedures:  Large Joint Inj Date/Time: 08/31/2016 5:09 PM Performed by: Bo Merino Authorized by: Bo Merino   Consent Given by:  Patient Site marked: the procedure site was marked   Timeout: prior to procedure the correct patient, procedure, and site was verified   Indications:  Pain Location:  Shoulder Site:  L subacromial bursa Prep: patient was prepped and draped in usual sterile fashion   Needle Size:  27 G Needle Length:  1.5 inches Approach:  Posterior Ultrasound Guidance: No   Fluoroscopic Guidance: No   Arthrogram: No   Medications:  1 mL lidocaine 1 %; 30 mg triamcinolone acetonide 40 MG/ML Aspiration Attempted: Yes   Aspirate amount (mL):  0 Patient tolerance:  Patient tolerated the procedure well with  no immediate complications   Allergies: Invokana [canagliflozin]; Lipitor [atorvastatin]; Losartan; Vicodin [hydrocodone-acetaminophen]; Codeine; and Roxicodone [oxycodone]   Assessment / Plan:     Visit Diagnoses: Neck pain - patient describes pain in the base of his neck and some popping sensation in his neck. He also reports pain radiating to his left shoulder. Today AP lateral and flexion extension views showed multilevel spondylosis. Plan: XR Cervical Spine 2 or 3 views, XR Cervical Spine 2 or 3 views, XR Shoulder Left, MR CERVICAL SPINE WO CONTRAST will be obtained due to severe neck pain and sided radiculopathy.  Acute pain of left shoulder - Plan: XR Shoulder Left, MR CERVICAL SPINE WO CONTRAST. The x-ray was unremarkable. I decided to go ahead and do subacromial injection as he is having difficulty lifting his arm. The procedures described above. He tolerated the procedure well.  Rheumatoid arthritis involving multiple sites with positive rheumatoid factor (HCC)  High risk medication use - Arava, Humira, he is on prednisone taper as well. We will see how he responds to the treatment over time he has follow-up appointment coming up soon.  Primary osteoarthritis of right knee: Chronic pain  Contracture, elbow, left  Diabetes mellitus without complication ([place]): He is been advised to monitor his blood sugars closely after the cortisone injection and contact his PCP in case his blood sugar fluctuates.  Mixed hyperlipidemia  Coronary artery disease due to lipid rich plaque    Orders: Orders Placed This Encounter  Procedures  . XR Cervical Spine 2 or 3 views  . XR Cervical Spine 2 or 3 views  . XR Shoulder Left  . MR CERVICAL SPINE WO CONTRAST   No orders of the defined types were placed in this encounter.   Face-to-face time spent with patient was 30 minutes. 50% of time was spent in counseling and coordination of care.  Follow-Up Instructions: Return for Rheumatoid  arthritis.   Bo Merino, MD  Note - This record has been created using Editor, commissioning.  Chart creation errors have been sought, but [place] not always  have been located. Such creation errors do not reflect on  the standard of medical care.

## 2016-09-02 ENCOUNTER — Ambulatory Visit (HOSPITAL_COMMUNITY)
Admission: RE | Admit: 2016-09-02 | Discharge: 2016-09-02 | Disposition: A | Discharge status: Home / Self Care | Payer: Medicare Other | Source: Ambulatory Visit | Attending: Rheumatology | Admitting: Rheumatology

## 2016-09-02 DIAGNOSIS — M25512 Pain in left shoulder: Secondary | ICD-10-CM | POA: Diagnosis not present

## 2016-09-02 DIAGNOSIS — M542 Cervicalgia: Secondary | ICD-10-CM | POA: Insufficient documentation

## 2016-09-02 DIAGNOSIS — M4802 Spinal stenosis, cervical region: Secondary | ICD-10-CM | POA: Insufficient documentation

## 2016-09-02 DIAGNOSIS — R600 Localized edema: Secondary | ICD-10-CM | POA: Diagnosis not present

## 2016-09-02 DIAGNOSIS — M47892 Other spondylosis, cervical region: Secondary | ICD-10-CM | POA: Diagnosis not present

## 2016-09-04 ENCOUNTER — Telehealth (INDEPENDENT_AMBULATORY_CARE_PROVIDER_SITE_OTHER): Payer: Self-pay | Admitting: Rheumatology

## 2016-09-04 NOTE — Telephone Encounter (Signed)
Patients wife called to request our office call patient on his mobile number with MRI results. Patient will be away from home due to other appointments today.

## 2016-09-04 NOTE — Telephone Encounter (Signed)
Multilevel spondylosis. He has severe disc disease but no new changes. I have called patient to advise. He will let us know if he gets worse, he does state he is somewhat better.

## 2016-09-04 NOTE — Progress Notes (Signed)
Multilevel spondylosis. He has severe disc disease but no new changes.

## 2016-09-06 ENCOUNTER — Ambulatory Visit (HOSPITAL_COMMUNITY): Payer: PRIVATE HEALTH INSURANCE

## 2016-09-07 ENCOUNTER — Other Ambulatory Visit: Payer: Self-pay | Admitting: Rheumatology

## 2016-09-07 NOTE — Telephone Encounter (Signed)
Ok for 90 d supply  per Dr Estanislado Pandy

## 2016-09-07 NOTE — Telephone Encounter (Signed)
08/31/16 last visit  11/08/16 next visit  Labs in March  Ok to refill per Dr Estanislado Pandy

## 2016-09-12 DIAGNOSIS — E1122 Type 2 diabetes mellitus with diabetic chronic kidney disease: Secondary | ICD-10-CM | POA: Diagnosis not present

## 2016-09-12 DIAGNOSIS — E1165 Type 2 diabetes mellitus with hyperglycemia: Secondary | ICD-10-CM | POA: Diagnosis not present

## 2016-09-12 DIAGNOSIS — N182 Chronic kidney disease, stage 2 (mild): Secondary | ICD-10-CM | POA: Diagnosis not present

## 2016-09-12 DIAGNOSIS — I10 Essential (primary) hypertension: Secondary | ICD-10-CM | POA: Diagnosis not present

## 2016-09-12 DIAGNOSIS — Z794 Long term (current) use of insulin: Secondary | ICD-10-CM | POA: Diagnosis not present

## 2016-09-18 ENCOUNTER — Telehealth: Payer: Self-pay | Admitting: Rheumatology

## 2016-09-18 NOTE — Telephone Encounter (Signed)
Patient's wife advised of recommendations and verbalzied understanding. She states she will advise patient.

## 2016-09-18 NOTE — Telephone Encounter (Signed)
Patient having bad flare up, and wants to know if there is anything he can take Prednisone. Prednisone is increasing his sugar level, and making him nauseated.

## 2016-09-18 NOTE — Telephone Encounter (Signed)
Patient is on Humira and [place]. Patient due for Humira injection Wednesday. Patient is having pain in both hands, shoulders as well but states the pain in shoulders is not as bad as his hands. Patient taking tylenol and is not getting relief. Patient states his knees are also painful. Patient is having swelling in his hands. Patient is on Prednisone but states it is causing nausea and increased blood sugars. Patient has spoke to PCP about the increased blood sugars and has had his insulin pump adjusted. Patient would like to know if there is something else that can be done.

## 2016-09-18 NOTE — Telephone Encounter (Signed)
Recommend Tylenol for pain 

## 2016-09-29 ENCOUNTER — Telehealth: Payer: Self-pay | Admitting: Rheumatology

## 2016-09-29 ENCOUNTER — Telehealth: Payer: Self-pay | Admitting: Pharmacist

## 2016-09-29 MED ORDER — TRAMADOL HCL 50 MG PO TABS: ORAL_TABLET | ORAL | 0 refills | Status: DC

## 2016-09-29 NOTE — Telephone Encounter (Signed)
Spoke with Dr. Estanislado Pandy, she advised to offer patient a prescription for Prednisone 5 mg daily and if he was hesitant to take that prescription we could offer a prescription for Ultram 50 mg 2 tablets up to TID for 30 days. Patient chose not to take the prescription for the prednisone due to the increasing blood sugars. Prescription for Ultram called to the pharmacy. Patient asked if he could take Ibuprofen with the Ultram. Patient advised he could.

## 2016-09-29 NOTE — Telephone Encounter (Signed)
Spoke with Dalene Seltzer regarding Mr. Wulf. She states he is hurting so bad he is crying at night. States his neck is hurting so bad he can't turn his neck. Also states  He is having pain in his shoulders and pain and swelling in his wrists and hands. Patient is due for his next Humira injection next week. Mr vallance is no longer on Prednisone, he has tapered off of it. Patient was advised by his endocrinologist that he should taper off of it because his blood sugars were staying so high. He was having blood sugars of 600 and having to double his insulin. Patient states the prednisone made him nauseous and caused him to lose his appetite. Patient has been scheduled for an appointment on 10/03/16 @ 3:30 pm.

## 2016-09-29 NOTE — Progress Notes (Signed)
Office Visit Note  Patient: Joel Takahashi Wah Sr.             Date of Birth: 1945-11-18           MRN: [id number]             PCP: [name], MD Referring: [name], MD Visit Date: 10/03/2016 Occupation: '@GUAROCC' @    Subjective:  Weight Loss (No appetite, difficulty sleeping, exhausted, bil shoulder pain, bil wrist pain, bil hand pain, bil jaw pain, bil hip pain, groin pain)   History of Present Illness: Joel Maselli Fontenette Sr. is a 71 y.o. male with history of sero positive rheumatoid arthritis. He complains of left shoulder joint, right elbow joint, bilateral wrist joints, bilateral hands and right hip joint pain. He continues to have pain and swelling in his bilateral hands. He's been on Arava 20 mg by mouth daily and Humira 40 mg subcutaneous every other week for last few months now. He has not noticed significant improvement on this combination.  Activities of Daily Living:  Patient reports morning stiffness for4 hours.   Patient Reports nocturnal pain.  Difficulty dressing/grooming: Reports Difficulty climbing stairs: Reports Difficulty getting out of chair: Reports Difficulty using hands for taps, buttons, cutlery, and/or writing: Reports   Review of Systems  Constitutional: Positive for activity change, appetite change, fatigue and weight loss. Negative for night sweats and weakness ( ).  HENT: Negative for mouth sores, mouth dryness and nose dryness.        Ear and Jaw pain   Eyes: Negative.  Negative for redness and dryness.  Respiratory: Negative.  Negative for shortness of breath and difficulty breathing.   Cardiovascular: Negative.  Negative for chest pain, palpitations, hypertension, irregular heartbeat and swelling in legs/feet.  Gastrointestinal: Positive for nausea. Negative for constipation and diarrhea.  Endocrine: Positive for cold intolerance and heat intolerance. Negative for increased urination.  Genitourinary: Positive for urgency.  Musculoskeletal: Positive  for arthralgias, joint pain, myalgias, morning stiffness and myalgias. Negative for joint swelling, muscle weakness and muscle tenderness.  Skin: Negative.  Negative for color change, rash, hair loss, nodules/bumps, skin tightness, ulcers and sensitivity to sunlight.  Allergic/Immunologic: Negative.  Negative for susceptible to infections.  Neurological: Positive for dizziness. Negative for fainting, memory loss and night sweats.  Hematological: Negative.  Negative for swollen glands.  Psychiatric/Behavioral: Negative.  Negative for depressed mood and sleep disturbance. The patient is not nervous/anxious.     PMFS History:  Patient Active Problem List   Diagnosis Date Noted  . High risk medication use 07/06/2016  . History of diabetes mellitus 07/06/2016  . History of coronary artery disease 07/06/2016  . Rheumatoid arthritis involving multiple sites with positive rheumatoid factor ([place]) 06/02/2016  . Other fatigue 06/02/2016  . Contracture, elbow, left 06/02/2016  . Swelling of left hand 05/01/2016  . Osteoarthritis of right knee 08/27/2015  . S/P TKR (total knee replacement), left 08/27/2015  . Essential hypertension 05/11/2015  . Pain in the chest   . Right groin pain 08/15/2012  . Chest pain 04/02/2012  . Cerebrovascular disease 04/02/2012  . CAD (coronary artery disease) 04/02/2012  . Diabetes mellitus without complication ([place])   . Dizziness   . Sinus bradycardia   . Hyperlipidemia   . Edema     Past Medical History:  Diagnosis Date  . Anemia   . Arthritis    "all over"  . Bradycardia   . Chronic bronchitis ([place])    "get it just about  q yr" (03/17/2014)  . Daily headache    "here lately" (03/17/2014); relates to sinuses  . GERD (gastroesophageal reflux disease)   . Hard of hearing    hearing aides bilat  . Hiatal hernia   . History of blood transfusion 2008   "related to OR"  . History of kidney stones   . Hyperlipidemia   . Hypertension   . Pneumonia 1972 X 1    . Type II diabetes mellitus ([place])   . Wears glasses     Family History  Problem Relation Age of Onset  . Cancer Father        prostate  . Heart disease Unknown        No family history   Past Surgical History:  Procedure Laterality Date  . broken finger      surgical repaired left hand 2nd finger   . cyst removed      per left knee/posteriorly  . CYSTECTOMY Left 2009   "behind knee"  . INGUINAL HERNIA REPAIR Right ?2010  . JOINT REPLACEMENT    . KNEE ARTHROSCOPY Bilateral 1980's  . PARTIAL KNEE ARTHROPLASTY Right 08/27/2015   Procedure: RIGHT UNICOMPARTMENTAL KNEE ARTHROPLASTY;  Surgeon: Mcarthur Rossetti, MD;  Location: [facility];  Service: Orthopedics;  Laterality: Right;  . REVISION TOTAL KNEE ARTHROPLASTY Left 2008  . TOTAL KNEE ARTHROPLASTY Left 2003   Social History   Social History Narrative   Patient lives at home with family.   Caffeine Use: occasionally     Objective: Vital Signs: BP 105/64 (BP Location: Left Arm, Patient Position: Sitting, Cuff Size: Normal)   Pulse 83   Resp 16   Ht '5\' 6"'  (1.676 m)   Wt 190 lb (86.2 kg)   BMI 30.67 kg/m    Physical Exam  Constitutional: He is oriented to person, place, and time. He appears well-developed and well-nourished.  HENT:  Head: Normocephalic and atraumatic.  Eyes: Conjunctivae and EOM are normal. Pupils are equal, round, and reactive to light.  Neck: Normal range of motion. Neck supple.  Cardiovascular: Normal rate, regular rhythm and normal heart sounds.   Pulmonary/Chest: Effort normal and breath sounds normal.  Abdominal: Soft. Bowel sounds are normal.  Neurological: He is alert and oriented to person, place, and time.  Skin: Skin is warm and dry. Capillary refill takes less than 2 seconds.  Psychiatric: He has a normal mood and affect. His behavior is normal.  Nursing note and vitals reviewed.    Musculoskeletal Exam: C-spine, thoracic, lumbar spine good range of motion. He tenderness over  bilateral TMJ joints. He is painful range of motion of his left shoulder joint. He had tenderness over right elbow joint. In contracture in his left elbow. He had tenderness and synovitis over bilateral wrist joints. The synovitis in other joints as described below. He has bilateral total knee replacement which is doing fairly well.  CDAI Exam: CDAI Homunculus Exam:   Tenderness:  Right temporomandibular and left temporomandibular RUE: ulnohumeral and radiohumeral and wrist LUE: glenohumeral and wrist Right hand: 2nd MCP, 3rd MCP, 2nd PIP, 3rd PIP and 4th PIP Left hand: 2nd MCP, 2nd PIP, 3rd PIP and 4th PIP RLE: acetabulofemoral and tibiofemoral  Swelling:  RUE: wrist LUE: wrist Right hand: 2nd MCP, 3rd PIP and 4th PIP Left hand: 2nd PIP, 3rd PIP and 4th PIP RLE: tibiofemoral  Joint Counts:  CDAI Tender Joint count: 14 CDAI Swollen Joint count: 9  Global Assessments:  Patient Global Assessment: 6 Provider Global  Assessment: 6  CDAI Calculated Score: 35   Investigation: Findings:  TB gold negative 05/24/2016  CBC Latest Ref Rng & Units 08/02/2016 07/12/2016 06/21/2016  WBC 3.8 - 10.8 K/uL 6.8 7.9 11.1(H)  Hemoglobin 13.2 - 17.1 g/dL 13.2 13.8 12.7(L)  Hematocrit 38.5 - 50.0 % 39.8 41.7 38.6  Platelets 140 - 400 K/uL 243 310 308   CMP Latest Ref Rng & Units 08/02/2016 07/12/2016 06/21/2016  Glucose 65 - 99 mg/dL 247(H) 228(H) 331(H)  BUN 7 - 25 mg/dL 21 20 27(H)  Creatinine 0.70 - 1.25 mg/dL 1.13 1.35(H) 1.33(H)  Sodium 135 - 146 mmol/L 138 138 133(L)  Potassium 3.5 - 5.3 mmol/L 3.8 3.5 4.6  Chloride 98 - 110 mmol/L 100 97(L) 99  CO2 20 - 31 mmol/L '29 22 26  ' Calcium 8.6 - 10.3 mg/dL 8.9 9.2 9.5  Total Protein 6.1 - 8.1 g/dL 5.8(L) 6.2 6.4  Total Bilirubin 0.2 - 1.2 mg/dL 0.5 0.6 0.5  Alkaline Phos 40 - 115 U/L 66 70 81  AST 10 - 35 U/L '20 20 21  ' ALT 9 - 46 U/L '22 22 23    ' Imaging: No results found.  Speciality Comments: No specialty comments  available.    Procedures:  No procedures performed Allergies: Invokana [canagliflozin]; Lipitor [atorvastatin]; Losartan; Vicodin [hydrocodone-acetaminophen]; Codeine; and Roxicodone [oxycodone]   Assessment / Plan:     Visit Diagnoses: Rheumatoid arthritis involving multiple sites with positive rheumatoid factor (HCC) - Positive RF, positive CCP, elevated ESR. He's been having pain and stiffness in multiple joints. He is active synovitis as described above. He has had inadequate response to combination of Humira and Arava so far. Different treatment options and their side effects were discussed at length. We discussed the option of trying IV Orencia. Indications side effects contraindications were discussed at length. We will apply for IV Orencia.  High risk medication use - Humira 40 mg sq q owk, Arava 20 mg by mouth daily - Plan: CBC with Differential/Platelet, COMPLETE METABOLIC PANEL WITH GFR today and then every 3 months.  Contracture, elbow, left: No change. He has  been also having discomfort in his right elbow.   Status post right partial knee replacement: He's been having some warmth and discomfort in his right knee  S/P TKR (total knee replacement), left: Doing well  Essential hypertension: His blood pressure is well controlled  History of coronary artery disease  History of diabetes mellitus : he continues to have high blood sugar. Will avoid of prednisone use  Other fatigue  High risk medication use - Plan: CBC with Differential/Platelet, COMPLETE METABOLIC PANEL WITH GFR    Orders: No orders of the defined types were placed in this encounter.  No orders of the defined types were placed in this encounter.   Face-to-face time spent with patient was 30 minutes. 50% of time was spent in counseling and coordination of care.  Follow-Up Instructions: Return for Rheumatoid arthritis.   Bo Merino, MD  Note - This record has been created using Editor, commissioning.   Chart creation errors have been sought, but [place] not always  have been located. Such creation errors do not reflect on  the standard of medical care.

## 2016-09-29 NOTE — Telephone Encounter (Signed)
Patient's wife called and states patient is having an arthritis flare. He is having a lot of pain and swelling, today is day two. Yesterday was so bad, he couldn't even drive. They were requesting an appointment for today but I explained Dr. Estanislado Pandy is not in today, I then advised her that I would like the nurse know his symptoms and see what could be done for him.

## 2016-09-29 NOTE — Addendum Note (Signed)
Addended by: Carole Binning on: 09/29/2016 11:15 AM   Modules accepted: Orders

## 2016-09-29 NOTE — Telephone Encounter (Signed)
Patient has appointment for Tuesday, 10/03/16, to discuss treatment options as he has been on Humira for 3 months and is continuing to have joint pain/swelling.  We will need to apply for any other biologic medication through patient assistance program.  I asked patient to bring his income information with him to his visit so we can submit application while he is in the office.  Patient voiced understanding.   Elisabeth Most, Pharm.D., BCPS, CPP Clinical Pharmacist Pager: [phone] Phone: [phone] 09/29/2016 1:14 PM

## 2016-10-03 ENCOUNTER — Encounter: Payer: Self-pay | Admitting: Rheumatology

## 2016-10-03 ENCOUNTER — Ambulatory Visit (INDEPENDENT_AMBULATORY_CARE_PROVIDER_SITE_OTHER): Payer: Medicare Other | Admitting: Rheumatology

## 2016-10-03 VITALS — BP 105/64 | HR 83 | Resp 16 | Ht 66.0 in | Wt 190.0 lb

## 2016-10-03 DIAGNOSIS — Z8639 Personal history of other endocrine, nutritional and metabolic disease: Secondary | ICD-10-CM

## 2016-10-03 DIAGNOSIS — I2510 Atherosclerotic heart disease of native coronary artery without angina pectoris: Secondary | ICD-10-CM | POA: Diagnosis not present

## 2016-10-03 DIAGNOSIS — Z79899 Other long term (current) drug therapy: Secondary | ICD-10-CM

## 2016-10-03 DIAGNOSIS — I10 Essential (primary) hypertension: Secondary | ICD-10-CM

## 2016-10-03 DIAGNOSIS — M24522 Contracture, left elbow: Secondary | ICD-10-CM

## 2016-10-03 DIAGNOSIS — M0579 Rheumatoid arthritis with rheumatoid factor of multiple sites without organ or systems involvement: Secondary | ICD-10-CM

## 2016-10-03 DIAGNOSIS — Z8679 Personal history of other diseases of the circulatory system: Secondary | ICD-10-CM | POA: Diagnosis not present

## 2016-10-03 DIAGNOSIS — R5383 Other fatigue: Secondary | ICD-10-CM | POA: Diagnosis not present

## 2016-10-03 DIAGNOSIS — Z96651 Presence of right artificial knee joint: Secondary | ICD-10-CM | POA: Diagnosis not present

## 2016-10-03 DIAGNOSIS — Z96652 Presence of left artificial knee joint: Secondary | ICD-10-CM

## 2016-10-03 DIAGNOSIS — I2583 Coronary atherosclerosis due to lipid rich plaque: Secondary | ICD-10-CM | POA: Diagnosis not present

## 2016-10-03 LAB — CBC WITH DIFFERENTIAL/PLATELET
Basophils Absolute: 64 cells/uL (ref 0–200)
Basophils Relative: 1 %
EOS ABS: 256 {cells}/uL (ref 15–500)
EOS PCT: 4 %
HCT: 39.3 % (ref 38.5–50.0)
HEMOGLOBIN: 13.2 g/dL (ref 13.2–17.1)
LYMPHS PCT: 30 %
Lymphs Abs: 1920 cells/uL (ref 850–3900)
MCH: 30.3 pg (ref 27.0–33.0)
MCHC: 33.6 g/dL (ref 32.0–36.0)
MCV: 90.3 fL (ref 80.0–100.0)
MONO ABS: 1216 {cells}/uL — AB (ref 200–950)
MPV: 9.8 fL (ref 7.5–12.5)
Monocytes Relative: 19 %
NEUTROS PCT: 46 %
Neutro Abs: 2944 cells/uL (ref 1500–7800)
Platelets: 487 10*3/uL — ABNORMAL HIGH (ref 140–400)
RBC: 4.35 MIL/uL (ref 4.20–5.80)
RDW: 12.3 % (ref 11.0–15.0)
WBC: 6.4 10*3/uL (ref 3.8–10.8)

## 2016-10-03 NOTE — Progress Notes (Signed)
Pharmacy Note  Subjective: Patient presents today to the [facility] to see Dr. Estanislado Pandy.  Patient has been on Humira since July 12, 2016 with inadequate response.  Decision was made to switch patient's therapy.  Had detailed discussion and decided to try to apply for Orencia.  If Joel Jones is not covered, will consider applying for Enbrel.  Patient is aware that any new biologic cannot be started until 2 weeks after his last Humira dose.    Objective: TB Test: negative (05/24/16) Hepatitis panel: negative (05/24/16) HIV: negative (05/24/16)  CBC    Component Value Date/Time   WBC 6.8 08/02/2016 0956   RBC 4.27 08/02/2016 0956   HGB 13.2 08/02/2016 0956   HCT 39.8 08/02/2016 0956   PLT 243 08/02/2016 0956   MCV 93.2 08/02/2016 0956   MCH 30.9 08/02/2016 0956   MCHC 33.2 08/02/2016 0956   RDW 13.2 08/02/2016 0956   LYMPHSABS 1,428 08/02/2016 0956   MONOABS 1,088 (H) 08/02/2016 0956   EOSABS 340 08/02/2016 0956   BASOSABS 136 08/02/2016 0956   CMP     Component Value Date/Time   NA 138 08/02/2016 0956   K 3.8 08/02/2016 0956   CL 100 08/02/2016 0956   CO2 29 08/02/2016 0956   GLUCOSE 247 (H) 08/02/2016 0956   BUN 21 08/02/2016 0956   CREATININE 1.13 08/02/2016 0956   CALCIUM 8.9 08/02/2016 0956   PROT 5.8 (L) 08/02/2016 0956   ALBUMIN 3.6 08/02/2016 0956   AST 20 08/02/2016 0956   ALT 22 08/02/2016 0956   ALKPHOS 66 08/02/2016 0956   BILITOT 0.5 08/02/2016 0956   GFRNONAA 66 08/02/2016 0956   GFRAA 76 08/02/2016 0956    Assessment/Plan:  Counseled patient that Orencia is a selective T-cell costimulation blocker indicated for rheumatoid arthritis.  Counseled patient on purpose, proper use, and adverse effects of Orencia. The Jones common adverse effects are increased risk of infections, headache, and infusion reactions.  There is the possibility of an increased risk of malignancy but it is not well understood if this increased risk is due to the medication  or the disease state.  Provided patient with medication education material and answered all questions.  Patient consented to [facility].  Will upload consent into patient's chart.  Will submit Orencia benefits investigation.  Will update patient once we know results of benefits investigation.    If Joel Jones is not covered, will consider Enbrel.  Counseled patient that Enbrel is a TNF blocking agent.  Reviewed Enbrel dose of 50 mg once weekly.  Counseled patient on purpose, proper use, and adverse effects of Enbrel.  Reviewed the Jones common adverse effects including infections, headache, and injection site reactions. Discussed that there is the possibility of an increased risk of malignancy but it is not well understood if this increased risk is due to the medication or the disease state.  Advised patient to get yearly dermatology exams due to risk of skin cancer.  Reviewed the importance of regular labs while on Enbrel therapy.  Counseled patient that Enbrel should be held prior to scheduled surgery.  Counseled patient to avoid live vaccines while on Enbrel.  Advised patient to get annual influenza vaccine and the pneumococcal vaccine as needed.  Provided patient with medication education material and answered all questions.  Patient voiced understanding.  Patient consented to Enbrel.  Will upload consent into the media tab.  Reviewed storage instructions for Enbrel.  Patient completed Enbrel patient assistance application.  If Joel Jones is not approved,  will submit Enbrel patient assistance application.    [facility], Pharm.D., BCPS Clinical Pharmacist Pager: [phone] Phone: [phone] 10/03/2016 4:32 PM

## 2016-10-03 NOTE — Patient Instructions (Addendum)
Standing Labs We placed an order today for your standing lab work.    Please come back and get your standing labs in 1 month after starting your new medication then every 2 months.   We have open lab Monday through Friday from 8:30-11:30 AM and 1:30-4 PM at the office of Dr. Tresa Moore, PA.   The office is located at [address] [place], [place], [place] [address] No appointment is necessary.   Labs are drawn by [facility].  You Browder receive a bill from [facility] for your lab work. If you have any questions regarding directions or hours of operation,  please call [phone].    Abatacept solution for injection (subcutaneous or intravenous use) What is this medicine? ABATACEPT (a ba TA sept) is used to treat moderate to severe active rheumatoid arthritis or psoriatic arthritis in adults. This medicine is also used to treat juvenile idiopathic arthritis. This medicine Vest be used for other purposes; ask your health care provider or pharmacist if you have questions. COMMON BRAND NAME(S): Orencia What should I tell my health care provider before I take this medicine? They need to know if you have any of these conditions: -are taking other medicines to treat rheumatoid arthritis -COPD -diabetes -infection or history of infections -recently received or scheduled to receive a vaccine -scheduled to have surgery -tuberculosis, a positive skin test for tuberculosis or have recently been in close contact with someone who has tuberculosis -viral hepatitis -an unusual or allergic reaction to abatacept, other medicines, foods, dyes, or preservatives -pregnant or trying to get pregnant -breast-feeding How should I use this medicine? This medicine is for infusion into a vein or for injection under the skin. Infusions are given by a health care professional in a hospital or clinic setting. If you are to give your own medicine at home, you will be taught how to prepare and give  this medicine under the skin. Use exactly as directed. Take your medicine at regular intervals. Do not take your medicine more often than directed. It is important that you put your used needles and syringes in a special sharps container. Do not put them in a trash can. If you do not have a sharps container, call your pharmacist or healthcare provider to get one. Talk to your pediatrician regarding the use of this medicine in children. While infusions in a clinic Morren be prescribed for children as young as 2 years for selected conditions, precautions do apply. Overdosage: If you think you have taken too much of this medicine contact a poison control center or emergency room at once. NOTE: This medicine is only for you. Do not share this medicine with others. What if I miss a dose? This medicine is used once a week if given by injection under the skin. If you miss a dose, take it as soon as you can. If it is almost time for your next dose, take only that dose. Do not take double or extra doses. If you are to be given an infusion, it is important not to miss your dose. Doses are usually every 4 weeks. Call your doctor or health care professional if you are unable to keep an appointment. What Pereyra interact with this medicine? Do not take this medicine with any of the following medications: -adalimumab -anakinra -certolizumab -etanercept -golimumab -infliximab -live virus vaccines -rituximab -tocilizumab This medicine Wulff also interact with the following medications: -vaccines This list Posch not describe all possible interactions. Give your health care provider  a list of all the medicines, herbs, non-prescription drugs, or dietary supplements you use. Also tell them if you smoke, drink alcohol, or use illegal drugs. Some items Streiff interact with your medicine. What should I watch for while using this medicine? Visit your doctor for regular check ups while you are taking this medicine. Tell your doctor  or healthcare professional if your symptoms do not start to get better or if they get worse. Call your doctor or health care professional if you get a cold or other infection while receiving this medicine. Do not treat yourself. This medicine Mcgeachy decrease your body's ability to fight infection. Try to avoid being around people who are sick. What side effects Vankuren I notice from receiving this medicine? Side effects that you should report to your doctor or health care professional as soon as possible: -allergic reactions like skin rash, itching or hives, swelling of the face, lips, or tongue -breathing problems -chest pain -signs of infection - fever or chills, cough, unusual tiredness, pain or trouble passing urine, or warm, red or painful skin Side effects that usually do not require medical attention (report to your doctor or health care professional if they continue or are bothersome): -dizziness -headache -nausea, vomiting -sore throat -stomach upset This list Cohenour not describe all possible side effects. Call your doctor for medical advice about side effects. You Jacinto report side effects to FDA at 1-800-FDA-1088. Where should I keep my medicine? Infusions will be given in a hospital or clinic and will not be stored at home. Storage for syringes given under the skin and stored at home: Keep out of the reach of children. Store in a refrigerator between 2 and 8 degrees C (36 and 46 degrees F). Keep this medicine in the original container. Protect from light. Do not freeze. Throw away any unused medicine after the expiration date. NOTE: This sheet is a summary. It Hayduk not cover all possible information. If you have questions about this medicine, talk to your doctor, pharmacist, or health care provider.  2018 Elsevier/Gold Standard (2015-11-18 10:07:35)   Etanercept injection What is this medicine? ETANERCEPT (et a [employer]) is used for the treatment of rheumatoid arthritis in adults and  children. The medicine is also used to treat psoriatic arthritis, ankylosing spondylitis, and psoriasis. This medicine Szczerba be used for other purposes; ask your health care provider or pharmacist if you have questions. COMMON BRAND NAME(S): Enbrel What should I tell my health care provider before I take this medicine? They need to know if you have any of these conditions: -blood disorders -cancer -congestive heart failure -diabetes -exposure to chickenpox -immune system problems -infection -multiple sclerosis -seizure disorder -tuberculosis, a positive skin test for tuberculosis or have recently been in close contact with someone who has tuberculosis -Wegener's granulomatosis -an unusual or allergic reaction to etanercept, latex, other medicines, foods, dyes, or preservatives -pregnant or trying to get pregnant -breast-feeding How should I use this medicine? The medicine is given by injection under the skin. You will be taught how to prepare and give this medicine. Use exactly as directed. Take your medicine at regular intervals. Do not take your medicine more often than directed. It is important that you put your used needles and syringes in a special sharps container. Do not put them in a trash can. If you do not have a sharps container, call your pharmacist or healthcare provider to get one. A special MedGuide will be given to you by the pharmacist with each  prescription and refill. Be sure to read this information carefully each time. Talk to your pediatrician regarding the use of this medicine in children. While this drug [facility] be prescribed for children as young as 9 years of age for selected conditions, precautions do apply. Overdosage: If you think you have taken too much of this medicine contact a poison control center or emergency room at once. NOTE: This medicine is only for you. Do not share this medicine with others. What if I miss a dose? If you miss a dose, contact your health  care professional to find out when you should take your next dose. Do not take double or extra doses without advice. What Dreese interact with this medicine? Do not take this medicine with any of the following medications: -anakinra This medicine Bayon also interact with the following medications: -cyclophosphamide -sulfasalazine -vaccines This list [place]way not describe all possible interactions. Give your health care provider a list of all the medicines, herbs, non-prescription drugs, or dietary supplements you use. Also tell them if you smoke, drink alcohol, or use illegal drugs. Some items Curb interact with your medicine. What should I watch for while using this medicine? Tell your doctor or healthcare professional if your symptoms do not start to get better or if they get worse. You will be tested for tuberculosis (TB) before you start this medicine. If your doctor prescribes any medicine for TB, you should start taking the TB medicine before starting this medicine. Make sure to finish the full course of TB medicine. Call your doctor or health care professional for advice if you get a fever, chills or sore throat, or other symptoms of a cold or flu. Do not treat yourself. This drug decreases your body's ability to fight infections. Try to avoid being around people who are sick. What side effects Rosander I notice from receiving this medicine? Side effects that you should report to your doctor or health care professional as soon as possible: -allergic reactions like skin rash, itching or hives, swelling of the face, lips, or tongue -changes in vision -fever, chills or any other sign of infection -numbness or tingling in legs or other parts of the body -red, scaly patches or raised bumps on the skin -shortness of breath or difficulty breathing -swollen lymph nodes in the neck, underarm, or groin areas -unexplained weight loss -unusual bleeding or bruising -unusual swelling or fluid retention in the  legs -unusually weak or tired Side effects that usually do not require medical attention (report to your doctor or health care professional if they continue or are bothersome): -dizziness -headache -nausea -redness, itching, or swelling at the injection site -vomiting This list Bossi not describe all possible side effects. Call your doctor for medical advice about side effects. You Pickelsimer report side effects to FDA at 1-800-FDA-1088. Where should I keep my medicine? Keep out of the reach of children. Store between 2 and 8 degrees C (36 and 46 degrees F). Do not freeze or shake. Protect from light. Throw away any unused medicine after the expiration date. You will be instructed on how to store this medicine. NOTE: This sheet is a summary. It Duchesne not cover all possible information. If you have questions about this medicine, talk to your doctor, pharmacist, or health care provider.  2018 Elsevier/Gold Standard (2011-11-06 15:33:36)

## 2016-10-04 ENCOUNTER — Telehealth: Payer: Self-pay

## 2016-10-04 LAB — COMPLETE METABOLIC PANEL WITH GFR
ALBUMIN: 3.7 g/dL (ref 3.6–5.1)
ALK PHOS: 107 U/L (ref 40–115)
ALT: 14 U/L (ref 9–46)
AST: 20 U/L (ref 10–35)
BUN: 18 mg/dL (ref 7–25)
CO2: 27 mmol/L (ref 20–31)
Calcium: 9.6 mg/dL (ref 8.6–10.3)
Chloride: 91 mmol/L — ABNORMAL LOW (ref 98–110)
Creat: 1.2 mg/dL (ref 0.70–1.25)
GFR, Est African American: 71 mL/min (ref >=60)
GFR, Est Non African American: 61 mL/min (ref >=60)
Glucose, Bld: 156 mg/dL — ABNORMAL HIGH (ref 65–99)
POTASSIUM: 3.4 mmol/L — AB (ref 3.5–5.3)
Sodium: 133 mmol/L — ABNORMAL LOW (ref 135–146)
Total Bilirubin: 0.5 mg/dL (ref 0.2–1.2)
Total Protein: 7 g/dL (ref 6.1–8.1)

## 2016-10-04 NOTE — Progress Notes (Signed)
WNL

## 2016-10-04 NOTE — Telephone Encounter (Signed)
Submitted a benefits verification through [employer] access support to verify patient benefits regarding Orencia IV. Will update once we have a response.  Zared Knoth, [place], CPhT 9:04 AM

## 2016-10-06 ENCOUNTER — Telehealth: Payer: Self-pay | Admitting: Pharmacist

## 2016-10-06 ENCOUNTER — Other Ambulatory Visit: Payer: Self-pay | Admitting: Radiology

## 2016-10-06 DIAGNOSIS — M0579 Rheumatoid arthritis with rheumatoid factor of multiple sites without organ or systems involvement: Secondary | ICD-10-CM

## 2016-10-06 NOTE — Telephone Encounter (Signed)
I received a call from patient's wife, Dalene Seltzer, regarding patient's Orencia infusions.  She said she was able to talk to patient's supplement insurance plan who confirmed that the supplement would cover the 20% of the cost that was not paid for by medicare as long as medicare covered the medication.  I informed her we did a benefits investigation which confirmed that medicare covers 80% of the infusion and no authorization is required.    They want to proceed with Orencia infusions.  Patient took last Humira yesterday (10/05/16).  Advised Mrs. Snyders patient could not start Trimble until 10/19/16 at the earliest.    Amy, can you send in [place] orders?  Patient cannot infuse until 10/19/16 or later.  Thank you!

## 2016-10-06 NOTE — Telephone Encounter (Signed)
Patient's wife, Dalene Seltzer, called back. I informed her on what we learned about his insurance and encouraged her to give [place] a call to clarify his benefits. She voiced understanding and plans to call the company and update [place] on what she finds out.   Zoiey Christy, [place], CPhT 10:29 AM

## 2016-10-06 NOTE — Telephone Encounter (Signed)
Received benefit information regarding patients Orencia infusion through [employer] Access Support. Patient's primary insurance, Medicare, will cover 80% of the services. No prior authorization is required. There is a $183 deductible (amount met).   Called patient's secondary insurance ([employer]) to verify benefits. The automated system states that no pre-certification is required and the plan will follow the same guidelines as Medicare. I was unable to speak to anyone. The representative were not able to speak to providers or answer any questions over the phone.   Called patient to update him and suggest for him to call [place] to see if they will cover the remaining 20%. Left message for patient to call back.  Emilea Goga, [place], CPhT 9:27 AM

## 2016-10-06 NOTE — Progress Notes (Signed)
Infusion orders  Put in  today including CBC CMP Tylenol and Benadryl appointments are up to date and follow up appointment is scheduled TB gold is due on 05/24/2017

## 2016-10-06 NOTE — Telephone Encounter (Signed)
I have called her to advise the orders have been put in for him and she can call to schedule

## 2016-10-10 ENCOUNTER — Telehealth: Payer: Self-pay | Admitting: Rheumatology

## 2016-10-10 NOTE — Telephone Encounter (Signed)
Patient's wife states states the Tramadol and the Ibuprofen helps him with the pain. Patient is having trouble sleeping. Patient has tried Melatonin but it has the opposite effect and caused him to be up all night. Patient has not had any sleep in 3 nights. Patient denies use of prednisone. Patient states he has his first infusion set for October 19, 2016. Patient would like to know what to do.

## 2016-10-10 NOTE — Telephone Encounter (Signed)
I called patient's cell number. They do not have the option to we will message. I reviewed his chart and also medication list. I believe trazodone will be a good option for him. We can call trazodone 50 mg by mouth daily at bedtime total 30 with 2 refills for insomnia

## 2016-10-10 NOTE — Telephone Encounter (Signed)
Patient has been unable to sleep at night for about 3 nights. Doesn't know if meds are causing this. Please call patient to advise. Question if there is anything OTC that he can take to help.

## 2016-10-10 NOTE — Telephone Encounter (Signed)
Attempted to contact the patient and left message for patient to call the office.  

## 2016-10-11 ENCOUNTER — Telehealth: Payer: Self-pay | Admitting: Rheumatology

## 2016-10-11 MED ORDER — TRAZODONE HCL 50 MG PO TABS: 50.0000 mg | ORAL_TABLET | Freq: Every day | ORAL | 2 refills | Status: DC

## 2016-10-11 NOTE — Telephone Encounter (Signed)
Patient's wife called to return a call from yesterday.  She asked that we call her back on the home phone number.  It is [phone].  Thank you.

## 2016-10-11 NOTE — Telephone Encounter (Signed)
Patient advised Trazodone would be an option for the insomnia. Patient states he would like to try it and prescription has been sent to the pharmacy.

## 2016-10-11 NOTE — Telephone Encounter (Signed)
Explained to [facility] that a prescription for trazodone was sent to the pharmacy for Joel Jones to take at night for the insomnia.

## 2016-10-18 ENCOUNTER — Ambulatory Visit (INDEPENDENT_AMBULATORY_CARE_PROVIDER_SITE_OTHER): Payer: Medicare Other | Admitting: Orthopaedic Surgery

## 2016-10-18 ENCOUNTER — Ambulatory Visit (INDEPENDENT_AMBULATORY_CARE_PROVIDER_SITE_OTHER): Payer: Medicare Other

## 2016-10-18 DIAGNOSIS — M25572 Pain in left ankle and joints of left foot: Secondary | ICD-10-CM

## 2016-10-18 MED ORDER — LIDOCAINE HCL 1 % IJ SOLN
2.0000 mL | INTRAMUSCULAR | Status: AC | PRN
  Administered 2016-10-18: 2 mL

## 2016-10-18 MED ORDER — METHYLPREDNISOLONE ACETATE 40 MG/ML IJ SUSP
40.0000 mg | INTRAMUSCULAR | Status: AC | PRN
  Administered 2016-10-18: 40 mg via INTRA_ARTICULAR

## 2016-10-18 NOTE — Progress Notes (Signed)
Office Visit Note   Patient: Joel Boeding Karam Sr.           Date of Birth: Jun 29, 1945           MRN: [id number] Visit Date: 10/18/2016              Requested by: [name], MD [phone], [place] [address] PCP: [name], MD   Assessment & Plan: Visit Diagnoses:  1. Pain in left ankle and joints of left foot     Plan: I was able to first anesthetized ankle joint with plain lidocaine. I then talk about trying a steroid injection in the ankle. We have is a diabetic but is reporting better control. I placed injection of lidocaine and Depo-Medrol than in the ankle joint. I then had him put his shoe and sock by: He stood on it and said his pain was definitely decreased. At this point we'll see him back as needed causing as we can see him at anytime things like this flareup for him.  Follow-Up Instructions: Return if symptoms worsen or fail to improve.   Orders:  Orders Placed This Encounter  Procedures  . Medium Joint Injection/Arthrocentesis  . XR Foot Complete Left   No orders of the defined types were placed in this encounter.     Procedures: Medium Joint Inj Date/Time: 10/18/2016 9:19 AM Performed by: Mcarthur Rossetti Authorized by: Mcarthur Rossetti   Location:  Ankle Site:  L ankle Ultrasound Guided: No   Fluoroscopic Guidance: No   Medications:  2 mL lidocaine 1 %; 40 mg methylPREDNISolone acetate 40 MG/ML     Clinical Data: No additional findings.   Subjective: No chief complaint on file. [name] comes in today with chief complaint of left foot and ankle pain and swelling. He unfortunately this had a bad response to [facility] and will be having an infusion tomorrow at the hospital for his rheumatoid disease. He's had an acute flare up of pain without injury of his left foot and left ankle and came in today for [place] to evaluate this. His pain is 10 out of 10 and is definitely causing him to walk with a significant limp. He denies any  injuries.  HPI  Review of Systems He denies any shortness of breath, chest pain, fever, chills, nausea, vomiting but does complain of multiple joint pains and aches.  Objective: Vital Signs: There were no vitals taken for this visit.  Physical Exam He is alert and oriented 3 and mobilizes incredibly slowly. He isn't definite discomfort but no acute distress Ortho Exam Examination of his left ankle shows no redness but there is swelling comparing the left and right ankles. There is some midfoot pain but no redness in this area at all and no open wounds. He's got good range of motion ankle but is very painful. Specialty Comments:  No specialty comments available.  Imaging: Xr Foot Complete Left  Result Date: 10/18/2016 3 views left foot show no acute findings. There is a small dorsal bone spur at the midfoot but knows fracture and well-maintained joint spaces.    PMFS History: Patient Active Problem List   Diagnosis Date Noted  . High risk medication use 07/06/2016  . History of diabetes mellitus 07/06/2016  . History of coronary artery disease 07/06/2016  . Rheumatoid arthritis involving multiple sites with positive rheumatoid factor ([place]) 06/02/2016  . Other fatigue 06/02/2016  . Contracture, elbow, left 06/02/2016  . Swelling of left hand  05/01/2016  . Osteoarthritis of right knee 08/27/2015  . S/P TKR (total knee replacement), left 08/27/2015  . Essential hypertension 05/11/2015  . Pain in the chest   . Right groin pain 08/15/2012  . Chest pain 04/02/2012  . Cerebrovascular disease 04/02/2012  . CAD (coronary artery disease) 04/02/2012  . Diabetes mellitus without complication ([place])   . Dizziness   . Sinus bradycardia   . Hyperlipidemia   . Edema    Past Medical History:  Diagnosis Date  . Anemia   . Arthritis    "all over"  . Bradycardia   . Chronic bronchitis ([place])    "get it just about q yr" (03/17/2014)  . Daily headache    "here lately" (03/17/2014);  relates to sinuses  . GERD (gastroesophageal reflux disease)   . Hard of hearing    hearing aides bilat  . Hiatal hernia   . History of blood transfusion 2008   "related to OR"  . History of kidney stones   . Hyperlipidemia   . Hypertension   . Pneumonia 1972 X 1  . Type II diabetes mellitus ([place])   . Wears glasses     Family History  Problem Relation Age of Onset  . Cancer Father        prostate  . Heart disease Unknown        No family history    Past Surgical History:  Procedure Laterality Date  . broken finger      surgical repaired left hand 2nd finger   . cyst removed      per left knee/posteriorly  . CYSTECTOMY Left 2009   "behind knee"  . INGUINAL HERNIA REPAIR Right ?2010  . JOINT REPLACEMENT    . KNEE ARTHROSCOPY Bilateral 1980's  . PARTIAL KNEE ARTHROPLASTY Right 08/27/2015   Procedure: RIGHT UNICOMPARTMENTAL KNEE ARTHROPLASTY;  Surgeon: Mcarthur Rossetti, MD;  Location: [facility];  Service: Orthopedics;  Laterality: Right;  . REVISION TOTAL KNEE ARTHROPLASTY Left 2008  . TOTAL KNEE ARTHROPLASTY Left 2003   Social History   Occupational History  .  Unemployed   Social History Main Topics  . Smoking status: Former Smoker    Packs/day: 1.00    Years: 5.00    Types: Cigarettes, Cigars    Quit date: 05/15/1968  . Smokeless tobacco: Never Used  . Alcohol use No  . Drug use: No  . Sexual activity: Yes

## 2016-10-19 ENCOUNTER — Ambulatory Visit (HOSPITAL_COMMUNITY)
Admission: RE | Admit: 2016-10-19 | Discharge: 2016-10-19 | Disposition: A | Discharge status: Home / Self Care | Payer: Medicare Other | Source: Ambulatory Visit | Attending: Rheumatology | Admitting: Rheumatology

## 2016-10-19 DIAGNOSIS — M0579 Rheumatoid arthritis with rheumatoid factor of multiple sites without organ or systems involvement: Secondary | ICD-10-CM | POA: Diagnosis not present

## 2016-10-19 MED ORDER — SODIUM CHLORIDE 0.9 % IV SOLN
750.0000 mg | INTRAVENOUS | Status: DC
  Administered 2016-10-19: 750 mg via INTRAVENOUS
  Filled 2016-10-19: qty 30

## 2016-10-31 DIAGNOSIS — M19071 Primary osteoarthritis, right ankle and foot: Secondary | ICD-10-CM | POA: Insufficient documentation

## 2016-10-31 DIAGNOSIS — M19041 Primary osteoarthritis, right hand: Secondary | ICD-10-CM | POA: Insufficient documentation

## 2016-10-31 DIAGNOSIS — M19042 Primary osteoarthritis, left hand: Secondary | ICD-10-CM

## 2016-10-31 DIAGNOSIS — M5030 Other cervical disc degeneration, unspecified cervical region: Secondary | ICD-10-CM | POA: Insufficient documentation

## 2016-10-31 DIAGNOSIS — M19072 Primary osteoarthritis, left ankle and foot: Secondary | ICD-10-CM

## 2016-10-31 NOTE — Progress Notes (Signed)
Office Visit Note  Patient: Joel Sermersheim Greiner Sr.             Date of Birth: [birth year]           MRN: [id number]             PCP: Orlena Sheldon, PA-C Referring: [name], MD Visit Date: 11/02/2016 Occupation: _0 @    Subjective:  Bilateral shoulder pain.   History of Present Illness: Joel Szeto Pilger Sr. is a 71 y.o. male with history of sero positive rheumatoid arthritis. He was switched from Humira to Orencia due to ongoing inflammatory arthritis. He has had only one infusion of Orencia so far and another one is due today. He has not noticed much improvement in his symptoms. He states his bilateral shoulder joints are very painful and keep him up at night. Currently he is been having arm discomfort in his right shoulder. He continues to have pain and swelling in his bilateral hands and his left ankle joint is painful. Left total knee replacement is doing well.  Activities of Daily Living:  Patient reports morning stiffness for hours.   Patient Reports nocturnal pain.  Difficulty dressing/grooming: Reports Difficulty climbing stairs: Reports Difficulty getting out of chair: Reports Difficulty using hands for taps, buttons, cutlery, and/or writing: Reports   Review of Systems  Constitutional: Positive for fatigue. Negative for night sweats and weakness ( ).  HENT: Positive for mouth dryness. Negative for mouth sores and nose dryness.   Eyes: Positive for dryness. Negative for redness.  Respiratory: Negative for shortness of breath and difficulty breathing.   Cardiovascular: Negative for chest pain, palpitations, hypertension, irregular heartbeat and swelling in legs/feet.  Gastrointestinal: Negative for constipation and diarrhea.  Endocrine: Negative for increased urination.  Musculoskeletal: Positive for arthralgias, joint pain, joint swelling and morning stiffness. Negative for myalgias, muscle weakness, muscle tenderness and myalgias.  Skin: Negative for color change, rash,  hair loss, nodules/bumps, skin tightness, ulcers and sensitivity to sunlight.  Allergic/Immunologic: Negative for susceptible to infections.  Neurological: Negative for dizziness, fainting, memory loss and night sweats.  Hematological: Negative for swollen glands.  Psychiatric/Behavioral: Positive for depressed mood and sleep disturbance. The patient is nervous/anxious.     PMFS History:  Patient Active Problem List   Diagnosis Date Noted  . DDD (degenerative disc disease), cervical 10/31/2016  . Primary osteoarthritis of both hands 10/31/2016  . Primary osteoarthritis of both feet 10/31/2016  . High risk medication use 07/06/2016  . History of diabetes mellitus 07/06/2016  . History of coronary artery disease 07/06/2016  . Rheumatoid arthritis involving multiple sites with positive rheumatoid factor ([place]) 06/02/2016  . Other fatigue 06/02/2016  . Contracture, elbow, left 06/02/2016  . Swelling of left hand 05/01/2016  . Osteoarthritis of right knee 08/27/2015  . S/P TKR (total knee replacement), left 08/27/2015  . Essential hypertension 05/11/2015  . Pain in the chest   . Right groin pain 08/15/2012  . Chest pain 04/02/2012  . Cerebrovascular disease 04/02/2012  . CAD (coronary artery disease) 04/02/2012  . Diabetes mellitus without complication ([place])   . Dizziness   . Sinus bradycardia   . Hyperlipidemia   . Edema     Past Medical History:  Diagnosis Date  . Anemia   . Arthritis    "all over"  . Bradycardia   . Chronic bronchitis ([place])    "get it just about q yr" (03/17/2014)  . Daily headache    "here lately" (03/17/2014); relates to sinuses  .  GERD (gastroesophageal reflux disease)   . Hard of hearing    hearing aides bilat  . Hiatal hernia   . History of blood transfusion 2008   "related to OR"  . History of kidney stones   . Hyperlipidemia   . Hypertension   . Pneumonia 1972 X 1  . Type II diabetes mellitus ([place])   . Wears glasses     Family History    Problem Relation Age of Onset  . Cancer Father        prostate  . Heart disease Unknown        No family history   Past Surgical History:  Procedure Laterality Date  . broken finger      surgical repaired left hand 2nd finger   . cyst removed      per left knee/posteriorly  . CYSTECTOMY Left 2009   "behind knee"  . INGUINAL HERNIA REPAIR Right ?2010  . JOINT REPLACEMENT    . KNEE ARTHROSCOPY Bilateral 1980's  . PARTIAL KNEE ARTHROPLASTY Right 08/27/2015   Procedure: RIGHT UNICOMPARTMENTAL KNEE ARTHROPLASTY;  Surgeon: Mcarthur Rossetti, MD;  Location: [facility];  Service: Orthopedics;  Laterality: Right;  . REVISION TOTAL KNEE ARTHROPLASTY Left 2008  . TOTAL KNEE ARTHROPLASTY Left 2003   Social History   Social History Narrative   Patient lives at home with family.   Caffeine Use: occasionally     Objective: Vital Signs: BP 110/71   Pulse 81   Resp 14   Ht _0  (1.676 m)   Wt 178 lb (80.7 kg)   BMI 28.73 kg/m    Physical Exam  Constitutional: He is oriented to person, place, and time. He appears well-developed and well-nourished.  HENT:  Head: Normocephalic and atraumatic.  Eyes: Conjunctivae and EOM are normal. Pupils are equal, round, and reactive to light.  Neck: Normal range of motion. Neck supple.  Cardiovascular: Normal rate, regular rhythm and normal heart sounds.   Pulmonary/Chest: Effort normal and breath sounds normal.  Abdominal: Soft. Bowel sounds are normal.  Neurological: He is alert and oriented to person, place, and time.  Skin: Skin is warm and dry. Capillary refill takes less than 2 seconds.  Psychiatric: He has a normal mood and affect. His behavior is normal.  Nursing note and vitals reviewed.    Musculoskeletal Exam: C-spine good range of motion and thoracic lumbar spine good range of motion. He had very painful limited range of motion of bilateral shoulder joints. He has contractures in his bilateral elbows. He has synovitis over  bilateral wrist joints. No synovitis over MCPs PIPs DIPs was noted. Hip joints are good range of motion. His right knees partially replaced and has small effusion left knees totally replaced and had no effusion. His left ankle joint is swollen. He has tenderness across his MTP joints with no synovitis.  CDAI Exam: CDAI Homunculus Exam:   Tenderness:  RUE: glenohumeral and wrist LUE: glenohumeral and wrist RLE: tibiofemoral LLE: tibiotalar  Swelling:  RUE: wrist LUE: wrist RLE: tibiofemoral LLE: tibiotalar  Joint Counts:  CDAI Tender Joint count: 5 CDAI Swollen Joint count: 3  Global Assessments:  Patient Global Assessment: 10 Provider Global Assessment: 8  CDAI Calculated Score: 26    Investigation: Findings:  Jan 2018 negative TB gold    CBC Latest Ref Rng & Units 10/03/2016 08/02/2016 07/12/2016  WBC 3.8 - 10.8 K/uL 6.4 6.8 7.9  Hemoglobin 13.2 - 17.1 g/dL 13.2 13.2 13.8  Hematocrit 38.5 - 50.0 %  39.3 39.8 41.7  Platelets 140 - 400 K/uL 487(H) 243 310    CMP Latest Ref Rng & Units 10/03/2016 08/02/2016 07/12/2016  Glucose 65 - 99 mg/dL 156(H) 247(H) 228(H)  BUN 7 - 25 mg/dL _0 Creatinine 0.70 - 1.25 mg/dL 1.20 1.13 1.35(H)  Sodium 135 - 146 mmol/L 133(L) 138 138  Potassium 3.5 - 5.3 mmol/L 3.4(L) 3.8 3.5  Chloride 98 - 110 mmol/L 91(L) 100 97(L)  CO2 20 - 31 mmol/L _1 Calcium 8.6 - 10.3 mg/dL 9.6 8.9 9.2  Total Protein 6.1 - 8.1 g/dL 7.0 5.8(L) 6.2  Total Bilirubin 0.2 - 1.2 mg/dL 0.5 0.5 0.6  Alkaline Phos 40 - 115 U/L 107 66 70  AST 10 - 35 U/L _2 ALT 9 - 46 U/L _3 Imaging: Xr Foot Complete Left  Result Date: 10/18/2016 3 views left foot show no acute findings. There is a small dorsal bone spur at the midfoot but knows fracture and well-maintained joint spaces.   Speciality Comments: No specialty comments available.    Procedures:  No procedures performed Allergies: Invokana [canagliflozin]; Lipitor [atorvastatin];  Losartan; Vicodin [hydrocodone-acetaminophen]; Codeine; and Roxicodone [oxycodone]   Assessment / Plan:     Visit Diagnoses: Rheumatoid arthritis involving multiple sites with positive rheumatoid factor (HCC) - +RF,+antiCCP, history of elevated ESR. He has severe disease with  ongoing pain and inflammation in multiple joints. He continues to have active of disease with some synovitis in multiple joints. He has had only one infusion of Orencia so far. He will have second infusion today. He seems to be quite depressed about his current clinical situation. We had detailed discussion. I offered prednisone 5 mg by mouth daily as a bridging therapy but she declined. If he has inadequate response onto Orencia over the next 2 month I Walkowski have to switch therapy again. He is taking tramadol to relieve his discomfort.  High risk medication use - IV Orencia/ Leflunomide 20 mg po qd (inadequate response to Humira)  Bilateral shoulder joints severe discomfort and difficulty raising his arms. I cannot to cortisone injection due to diabetes. We will have to wait and see response to the [place].  Contracture, elbow, bilateral  Chronic pain of both shoulders  Primary osteoarthritis of both hands  S/P TKR (total knee replacement), left, right knee joint partial replacement with a small effusion  Primary osteoarthritis of right knee  Primary osteoarthritis of both feet  Sicca symptoms: He complains of extremely dry mouth and difficulty chewing food due to that. Indications side effects contraindications were discussed. I'll give him pilocarpine 5 mg by mouth 3 times a day when necessary.  DDD (degenerative disc disease), cervical  Other fatigue  History of diabetes mellitus  History of coronary artery disease  History of hyperlipidemia  History of hypertension  Chronic insomnia: We will switch him from trazodone to mirtazapine that Kath help his appetite and also help with insomnia.  Orders: No  orders of the defined types were placed in this encounter.  Meds ordered this encounter  Medications  . pilocarpine (SALAGEN) 5 MG tablet    Sig: Take 1 tablet (5 mg total) by mouth 3 (three) times daily as needed.    Dispense:  90 tablet    Refill:  2    Face-to-face time spent with patient was 35 minutes. 50% of time was spent in counseling and coordination of care.  Follow-Up Instructions: Return in about  2 months (around 01/02/2017) for Rheumatoid arthritis.   Bo Merino, MD  Note - This record has been created using Editor, commissioning.  Chart creation errors have been sought, but Roussel not always  have been located. Such creation errors do not reflect on  the standard of medical care.

## 2016-11-01 ENCOUNTER — Other Ambulatory Visit (HOSPITAL_COMMUNITY): Payer: Self-pay | Admitting: *Deleted

## 2016-11-02 ENCOUNTER — Encounter: Payer: Self-pay | Admitting: Rheumatology

## 2016-11-02 ENCOUNTER — Telehealth: Payer: Self-pay

## 2016-11-02 ENCOUNTER — Ambulatory Visit (INDEPENDENT_AMBULATORY_CARE_PROVIDER_SITE_OTHER): Payer: Medicare Other | Admitting: Rheumatology

## 2016-11-02 ENCOUNTER — Ambulatory Visit (HOSPITAL_COMMUNITY)
Admission: RE | Admit: 2016-11-02 | Discharge: 2016-11-02 | Disposition: A | Discharge status: Home / Self Care | Payer: Medicare Other | Source: Ambulatory Visit | Attending: Rheumatology | Admitting: Rheumatology

## 2016-11-02 VITALS — BP 110/71 | HR 81 | Resp 14 | Ht 66.0 in | Wt 178.0 lb

## 2016-11-02 DIAGNOSIS — I2583 Coronary atherosclerosis due to lipid rich plaque: Secondary | ICD-10-CM

## 2016-11-02 DIAGNOSIS — M25511 Pain in right shoulder: Secondary | ICD-10-CM | POA: Diagnosis not present

## 2016-11-02 DIAGNOSIS — M24522 Contracture, left elbow: Secondary | ICD-10-CM | POA: Diagnosis not present

## 2016-11-02 DIAGNOSIS — Z79899 Other long term (current) drug therapy: Secondary | ICD-10-CM

## 2016-11-02 DIAGNOSIS — G8929 Other chronic pain: Secondary | ICD-10-CM

## 2016-11-02 DIAGNOSIS — M0579 Rheumatoid arthritis with rheumatoid factor of multiple sites without organ or systems involvement: Secondary | ICD-10-CM

## 2016-11-02 DIAGNOSIS — Z8679 Personal history of other diseases of the circulatory system: Secondary | ICD-10-CM

## 2016-11-02 DIAGNOSIS — M5030 Other cervical disc degeneration, unspecified cervical region: Secondary | ICD-10-CM | POA: Diagnosis not present

## 2016-11-02 DIAGNOSIS — R5383 Other fatigue: Secondary | ICD-10-CM | POA: Diagnosis not present

## 2016-11-02 DIAGNOSIS — Z96652 Presence of left artificial knee joint: Secondary | ICD-10-CM

## 2016-11-02 DIAGNOSIS — M19072 Primary osteoarthritis, left ankle and foot: Secondary | ICD-10-CM

## 2016-11-02 DIAGNOSIS — M19071 Primary osteoarthritis, right ankle and foot: Secondary | ICD-10-CM

## 2016-11-02 DIAGNOSIS — M19041 Primary osteoarthritis, right hand: Secondary | ICD-10-CM | POA: Diagnosis not present

## 2016-11-02 DIAGNOSIS — M19042 Primary osteoarthritis, left hand: Secondary | ICD-10-CM

## 2016-11-02 DIAGNOSIS — M1711 Unilateral primary osteoarthritis, right knee: Secondary | ICD-10-CM

## 2016-11-02 DIAGNOSIS — Z8639 Personal history of other endocrine, nutritional and metabolic disease: Secondary | ICD-10-CM | POA: Diagnosis not present

## 2016-11-02 DIAGNOSIS — I2510 Atherosclerotic heart disease of native coronary artery without angina pectoris: Secondary | ICD-10-CM

## 2016-11-02 DIAGNOSIS — M25512 Pain in left shoulder: Secondary | ICD-10-CM

## 2016-11-02 MED ORDER — MIRTAZAPINE 15 MG PO TABS: 15.0000 mg | ORAL_TABLET | Freq: Every day | ORAL | 2 refills | Status: DC

## 2016-11-02 MED ORDER — PILOCARPINE HCL 5 MG PO TABS: 5.0000 mg | ORAL_TABLET | Freq: Three times a day (TID) | ORAL | 2 refills | Status: DC | PRN

## 2016-11-02 MED ORDER — ACETAMINOPHEN 325 MG PO TABS: 650.0000 mg | ORAL_TABLET | ORAL | Status: DC

## 2016-11-02 MED ORDER — DIPHENHYDRAMINE HCL 25 MG PO CAPS: 25.0000 mg | ORAL_CAPSULE | ORAL | Status: DC

## 2016-11-02 MED ORDER — SODIUM CHLORIDE 0.9 % IV SOLN
750.0000 mg | INTRAVENOUS | Status: DC
  Administered 2016-11-02: 750 mg via INTRAVENOUS
  Filled 2016-11-02: qty 30

## 2016-11-02 NOTE — Telephone Encounter (Signed)
Spoke with patient's wife and she states she spoke with [facility] and they are calling [facility] and going to have the prescriptions transferred.

## 2016-11-02 NOTE — Progress Notes (Signed)
Rheumatology Medication Review by a Pharmacist Does the patient feel that his/her medications are working for him/her?  Yes Has the patient been experiencing any side effects to the medications prescribed?  No Does the patient have any problems obtaining medications?  No  Issues to address at subsequent visits: None   Pharmacist comments:  Joel Jones is a pleasant 71 yo M who presents for follow up of rheumatoid arthritis.  He is currently taking leflunomide 20 mg daily and getting Orencia 750 mg infusions.  He started Orencia on 10/19/16 (was previously on Humira with inadequate response).  He is scheduled for his second infusion today.  He will be due for infusion again on 11/16/16 then every 4 weeks.  He reports some improvement in symptoms after his infusion, but he feels like it has already worn off.  Advised it takes some time for the medication to get into his system and it Davlin take several months to see full effect of the medication.  Patient will get labs drawn with his infusions.  Most recent TB Gold negative on 05/24/16.  He will be due for TB Gold again in January 2019.     Decision was made to try mirtazapine instead of trazodone as patient is having difficulty sleeping and loss of appetite.  Counseled patient on purpose, proper use, and adverse effects of mirtazapine.  Reviewed black boxed warning of increased risk of suicidal thoughts in young adults.  Provided patient with educational materials on mirtazapine and answered all questions.    Elisabeth Most, Pharm.D., BCPS, CPP Clinical Pharmacist Pager: [phone] Phone: [phone] 11/02/2016 9:54 AM

## 2016-11-02 NOTE — Telephone Encounter (Signed)
Patient called stating that 2 of the Rx's that were prescribed today were sent to the wrong pharmacy and need to be sent to [facility].  Changed to correct pharmacy.

## 2016-11-02 NOTE — Patient Instructions (Signed)
Mirtazapine tablets What is this medicine? MIRTAZAPINE (mir TAZ a peen) is used to treat depression. This medicine Hodsdon be used for other purposes; ask your health care provider or pharmacist if you have questions. COMMON BRAND NAME(S): Remeron What should I tell my health care provider before I take this medicine? They need to know if you have any of these conditions: -bipolar disorder -glaucoma -kidney disease -liver disease -suicidal thoughts -an unusual or allergic reaction to mirtazapine, other medicines, foods, dyes, or preservatives -pregnant or trying to get pregnant -breast-feeding How should I use this medicine? Take this medicine by mouth with a glass of water. Follow the directions on the prescription label. Take your medicine at regular intervals. Do not take your medicine more often than directed. Do not stop taking this medicine suddenly except upon the advice of your doctor. Stopping this medicine too quickly Dohrman cause serious side effects or your condition Sean worsen. A special MedGuide will be given to you by the pharmacist with each prescription and refill. Be sure to read this information carefully each time. Talk to your pediatrician regarding the use of this medicine in children. Special care Kaufman be needed. Overdosage: If you think you have taken too much of this medicine contact a poison control center or emergency room at once. NOTE: This medicine is only for you. Do not share this medicine with others. What if I miss a dose? If you miss a dose, take it as soon as you can. If it is almost time for your next dose, take only that dose. Do not take double or extra doses. What Agerton interact with this medicine? Do not take this medicine with any of the following medications: -linezolid -MAOIs like Carbex, Eldepryl, Marplan, Nardil, and Parnate -methylene blue (injected into a vein) This medicine Laumann also interact with the following medications: -alcohol -antiviral  medicines for HIV or AIDS -certain medicines that treat or prevent blood clots like warfarin -certain medicines for depression, anxiety, or psychotic disturbances -certain medicines for fungal infections like ketoconazole and itraconazole -certain medicines for migraine headache like almotriptan, eletriptan, frovatriptan, naratriptan, rizatriptan, sumatriptan, zolmitriptan -certain medicines for seizures like carbamazepine or phenytoin -certain medicines for sleep -cimetidine -erythromycin -fentanyl -lithium -medicines for blood pressure -nefazodone -rasagiline -rifampin -supplements like St. John's wort, kava kava, valerian -tramadol -tryptophan This list Zelek not describe all possible interactions. Give your health care provider a list of all the medicines, herbs, non-prescription drugs, or dietary supplements you use. Also tell them if you smoke, drink alcohol, or use illegal drugs. Some items Dome interact with your medicine. What should I watch for while using this medicine? Tell your doctor if your symptoms do not get better or if they get worse. Visit your doctor or health care professional for regular checks on your progress. Because it Shelton take several weeks to see the full effects of this medicine, it is important to continue your treatment as prescribed by your doctor. Patients and their families should watch out for new or worsening thoughts of suicide or depression. Also watch out for sudden changes in feelings such as feeling anxious, agitated, panicky, irritable, hostile, aggressive, impulsive, severely restless, overly excited and hyperactive, or not being able to sleep. If this happens, especially at the beginning of treatment or after a change in dose, call your health care professional. You Messer get drowsy or dizzy. Do not drive, use machinery, or do anything that needs mental alertness until you know how this medicine affects you. Do not   stand or sit up quickly, especially if  you are an older patient. This reduces the risk of dizzy or fainting spells. Alcohol Duren interfere with the effect of this medicine. Avoid alcoholic drinks. This medicine Perman cause dry eyes and blurred vision. If you wear contact lenses you Strada feel some discomfort. Lubricating drops Antonucci help. See your eye doctor if the problem does not go away or is severe. Your mouth Klinedinst get dry. Chewing sugarless gum or sucking hard candy, and drinking plenty of water Bibb help. Contact your doctor if the problem does not go away or is severe. What side effects Wailes I notice from receiving this medicine? Side effects that you should report to your doctor or health care professional as soon as possible: -allergic reactions like skin rash, itching or hives, swelling of the face, lips, or tongue -anxious -changes in vision -chest pain -confusion -elevated mood, decreased need for sleep, racing thoughts, impulsive behavior -eye pain -fast, irregular heartbeat -feeling faint or lightheaded, falls -feeling agitated, angry, or irritable -fever or chills, sore throat -hallucination, loss of contact with reality -loss of balance or coordination -mouth sores -redness, blistering, peeling or loosening of the skin, including inside the mouth -restlessness, pacing, inability to keep still -seizures -stiff muscles -suicidal thoughts or other mood changes -trouble passing urine or change in the amount of urine -trouble sleeping -unusual bleeding or bruising -unusually weak or tired -vomiting Side effects that usually do not require medical attention (report to your doctor or health care professional if they continue or are bothersome): -change in appetite -constipation -dizziness -dry mouth -muscle aches or pains -nausea -tired -weight gain This list Oyola not describe all possible side effects. Call your doctor for medical advice about side effects. You Jalloh report side effects to FDA at 1-800-FDA-1088. Where  should I keep my medicine? Keep out of the reach of children. Store at room temperature between 15 and 30 degrees C (59 and 86 degrees F) Protect from light and moisture. Throw away any unused medicine after the expiration date. NOTE: This sheet is a summary. It Taitano not cover all possible information. If you have questions about this medicine, talk to your doctor, pharmacist, or health care provider.  2018 Elsevier/Gold Standard (2015-09-30 17:30:45)  

## 2016-11-06 ENCOUNTER — Telehealth: Payer: Self-pay | Admitting: Rheumatology

## 2016-11-06 NOTE — Telephone Encounter (Signed)
Patients wife calling with questions concerning patients rx for dry mouth. Please call back.

## 2016-11-07 ENCOUNTER — Other Ambulatory Visit: Payer: Self-pay | Admitting: Radiology

## 2016-11-07 MED ORDER — PILOCARPINE HCL 5 MG PO TABS: 5.0000 mg | ORAL_TABLET | Freq: Three times a day (TID) | ORAL | 2 refills | Status: DC | PRN

## 2016-11-07 NOTE — Progress Notes (Signed)
Infusion orders are current for patient CBC CMP Tylenol Benadryl TB gold not due yet, appointments are up to date and follow up appointment  is scheduled 

## 2016-11-07 NOTE — Telephone Encounter (Addendum)
Mrs. Geraldo is calling concerning [name] taking all of the medications together. She would like to know if it okay for him to be taking the ibuprofen, tramadol and the mirtazapine all together. Patient takes the Mirtazapine at night and when he takes the tramadol he is unable to sleep. Advised Mrs. Priyansh that patient Duncombe want to avoid Tramadol in the evening with the Mirtazapine so patient will be able to sleep. She was also concerned because he was given a prescription for pilocarpine and it was too expensive. Explained to Mrs. Antonio how to obtain a coupon from [email or web address] and will resend prescription to [facility].

## 2016-11-07 NOTE — Addendum Note (Signed)
Addended by: Carole Binning on: 11/07/2016 02:06 PM   Modules accepted: Orders

## 2016-11-07 NOTE — Telephone Encounter (Signed)
I have called patient to discuss query, left message for her to call me back.

## 2016-11-08 ENCOUNTER — Ambulatory Visit (INDEPENDENT_AMBULATORY_CARE_PROVIDER_SITE_OTHER): Payer: Medicare Other | Admitting: Physician Assistant

## 2016-11-08 ENCOUNTER — Ambulatory Visit (INDEPENDENT_AMBULATORY_CARE_PROVIDER_SITE_OTHER): Payer: Medicare Other

## 2016-11-08 DIAGNOSIS — Z96651 Presence of right artificial knee joint: Secondary | ICD-10-CM

## 2016-11-08 DIAGNOSIS — I2583 Coronary atherosclerosis due to lipid rich plaque: Secondary | ICD-10-CM | POA: Diagnosis not present

## 2016-11-08 DIAGNOSIS — M25572 Pain in left ankle and joints of left foot: Secondary | ICD-10-CM

## 2016-11-08 DIAGNOSIS — I2510 Atherosclerotic heart disease of native coronary artery without angina pectoris: Secondary | ICD-10-CM | POA: Diagnosis not present

## 2016-11-08 NOTE — Progress Notes (Signed)
Mr. Brickner returns today follow-up of his left ankle and right knee. He states knee overall is doing well. Still has some pain along medial joint line. Left ankle list swelling still bothering him. The injection helped some. He's having no giving way of the ankle catching locking or painful popping.  Physical exam: Right knee has good range of motion of the knee. No effusion abnormal warmth erythema right knee. No instability. Surgical incisions well-healed.  Left ankle good dorsiflexion plantar flexion without pain. Nontender over the posterior tibial tendon and peroneal tendons and Achilles. Tenderness of the anterior lateral and anterior medial aspect of the ankle. There is no erythema or abnormal warmth significant edema. 5 out of 5 strength with inversion eversion against resistance left foot.  Radiographs: Right knee status post unicompartmental arthroplasty. No evidence of hardware failure or loosening. Moderate/severe narrowing of the lateral joint line no acute fractures.  Left ankle 3 views AP lateral and oblique: No acute fractures. Talus well located within the ankle mortise. There is some mild narrowing along the medial aspect of the ankle joint. Lateral view does show some midfoot dorsal spurring. No overt erosive changes of bone.  Impression:Right knee status post unicompartmental arthroplasty. Moderate/severe lateral compartmental narrowing. Left ankle pain  Plan: We'll obtain some NSAIDs for him to apply 2 problems 3 times daily to the left foot and ankle. Did discuss with him how to lace shoes to take the pressure off the dorsal foot. He'll follow with [place] on an as-needed basis.

## 2016-11-16 ENCOUNTER — Encounter (HOSPITAL_COMMUNITY)
Admission: RE | Admit: 2016-11-16 | Discharge: 2016-11-16 | Disposition: A | Discharge status: Home / Self Care | Payer: Medicare Other | Source: Ambulatory Visit | Attending: Rheumatology | Admitting: Rheumatology

## 2016-11-16 DIAGNOSIS — M0579 Rheumatoid arthritis with rheumatoid factor of multiple sites without organ or systems involvement: Secondary | ICD-10-CM | POA: Diagnosis not present

## 2016-11-16 MED ORDER — SODIUM CHLORIDE 0.9 % IV SOLN
750.0000 mg | INTRAVENOUS | Status: DC
  Administered 2016-11-16: 750 mg via INTRAVENOUS
  Filled 2016-11-16: qty 30

## 2016-11-16 MED ORDER — ACETAMINOPHEN 325 MG PO TABS: 650.0000 mg | ORAL_TABLET | ORAL | Status: DC

## 2016-11-16 MED ORDER — DIPHENHYDRAMINE HCL 25 MG PO CAPS: 25.0000 mg | ORAL_CAPSULE | ORAL | Status: DC

## 2016-12-12 ENCOUNTER — Other Ambulatory Visit: Payer: Self-pay | Admitting: Radiology

## 2016-12-12 DIAGNOSIS — M0579 Rheumatoid arthritis with rheumatoid factor of multiple sites without organ or systems involvement: Secondary | ICD-10-CM

## 2016-12-12 NOTE — Progress Notes (Signed)
Infusion orders are current for patient CBC CMP Tylenol Benadryl appointments are up to date and follow up appointment  is scheduled TB gold not due yet, due on Jan 2019 

## 2016-12-13 ENCOUNTER — Encounter: Payer: Self-pay | Admitting: Physician Assistant

## 2016-12-13 ENCOUNTER — Ambulatory Visit (INDEPENDENT_AMBULATORY_CARE_PROVIDER_SITE_OTHER): Payer: Medicare Other | Admitting: Physician Assistant

## 2016-12-13 VITALS — BP 110/70 | HR 56 | Temp 97.5°F | Resp 16 | Ht 66.0 in | Wt 185.0 lb

## 2016-12-13 DIAGNOSIS — E119 Type 2 diabetes mellitus without complications: Secondary | ICD-10-CM

## 2016-12-13 DIAGNOSIS — I2510 Atherosclerotic heart disease of native coronary artery without angina pectoris: Secondary | ICD-10-CM | POA: Diagnosis not present

## 2016-12-13 DIAGNOSIS — E785 Hyperlipidemia, unspecified: Secondary | ICD-10-CM | POA: Diagnosis not present

## 2016-12-13 DIAGNOSIS — M0579 Rheumatoid arthritis with rheumatoid factor of multiple sites without organ or systems involvement: Secondary | ICD-10-CM | POA: Diagnosis not present

## 2016-12-13 DIAGNOSIS — I2583 Coronary atherosclerosis due to lipid rich plaque: Secondary | ICD-10-CM

## 2016-12-13 NOTE — Progress Notes (Addendum)
Patient ID: Joel Dulay Neer Sr. MRN: [id number], DOB: August 15, 1945, 71 y.o. Date of Encounter: @DATE @  Chief Complaint:  Chief Complaint  Patient presents with  . New Patient (Initial Visit)    HPI: 71 y.o. year old male  presents as a New Pt to Establish care.   His wife also had OV with me earlier today to Establish Care.  Their prior PCP sent letter that they were moving.  They have family members who come here.  Their land [place] near [place] Wilber B[place] in-laws land.   Mr. Degeorge sees the following specialists: Dr. Buddy Duty --- Diabetes & Cholesterol --- he manages this also Dr. Estanislado Pandy -- RA Dr Alexis Goodell Dr. Stanford Breed -- Cardiololgy  Reports thinks last Medicare Physical was ~ 2 years ago.   Has no specific concerns to address today.   Addendum added 12/25/2016---- He did bring in copy of his colonoscopy report and copy of his lipid panel. Colonoscopy performed at [facility] on 04/05/2012. Normal. Repeat 10 years. Lipid panel performed 06/27/2016 showed total cholesterol 130,   triglyceride 185,   HDL 39, and LDL 54    Past Medical History:  Diagnosis Date  . Anemia   . Arthritis    "all over"  . Bradycardia   . Chronic bronchitis ([place])    "get it just about q yr" (03/17/2014)  . Daily headache    "here lately" (03/17/2014); relates to sinuses  . GERD (gastroesophageal reflux disease)   . Hard of hearing    hearing aides bilat  . Hiatal hernia   . History of blood transfusion 2008   "related to OR"  . History of kidney stones   . Hyperlipidemia   . Hypertension   . Pneumonia 1972 X 1  . Type II diabetes mellitus ([place])   . Wears glasses      Home Meds: Outpatient Medications Prior to Visit  Medication Sig Dispense Refill  . amLODipine (NORVASC) 5 MG tablet Take 5 mg by mouth daily.    Marland Kitchen aspirin EC 81 MG tablet Take 81 mg by mouth daily.    . hydrochlorothiazide (HYDRODIURIL) 25 MG tablet take 25mg  by mouth daily  4  . insulin aspart (NOVOLOG FLEXPEN) 100 UNIT/ML FlexPen  Inject 10 Units into the skin 3 (three) times daily with meals. pump    . KRILL OIL 1000 MG CAPS Take 1,000 mg by mouth daily.    . Lancets (ONETOUCH ULTRASOFT) lancets FOR USE WHEN CHECKING BLOOD SUGAR QID.  3  . leflunomide (ARAVA) 20 MG tablet TAKE 1 TABLET BY MOUTH EVERY DAY 90 tablet 0  . Multiple Vitamin (MULTIVITAMIN WITH MINERALS) TABS Take 1 tablet by mouth daily.    Marland Kitchen NOVOLOG 100 UNIT/ML injection INJECT UP TO 65 UNITS PER DAY VIA INSULIN PUMP SUBCUTANEOUSLY  3  . ONE TOUCH ULTRA TEST test strip USE WHEN CHECKING BLOOD SUGAR QID.  3  . pantoprazole (PROTONIX) 40 MG tablet Take 1 tablet (40 mg total) by mouth daily. 30 tablet 0  . pilocarpine (SALAGEN) 5 MG tablet Take 1 tablet (5 mg total) by mouth 3 (three) times daily as needed. 90 tablet 2  . pravastatin (PRAVACHOL) 40 MG tablet TAKE 1 TABLET EVERY EVENING 90 tablet 2  . ramipril (ALTACE) 5 MG capsule Take 5 mg by mouth daily.    . vitamin B-12 (CYANOCOBALAMIN) 1000 MCG tablet Take 1,000 mcg by mouth daily.    . vitamin C (ASCORBIC ACID) 500 MG tablet Take 500 mg by mouth daily.    Marland Kitchen  vitamin E 400 UNIT capsule Take 400 Units by mouth daily.    . mirtazapine (REMERON) 15 MG tablet Take 1 tablet (15 mg total) by mouth at bedtime. 30 tablet 2  . traMADol (ULTRAM) 50 MG tablet Take 2 tablets by mouth up to three times daily 180 tablet 0   No facility-administered medications prior to visit.     Allergies:  Allergies  Allergen Reactions  . Invokana [Canagliflozin] Other (See Comments)    Stomach upset   . Lipitor [Atorvastatin] Other (See Comments)    Muscle aches   . Losartan Nausea And Vomiting  . Vicodin [Hydrocodone-Acetaminophen] Itching  . Codeine Swelling  . Roxicodone [Oxycodone] Itching    Social History   Social History  . Marital status: Married    Spouse name: Abe People  . Number of children: 3  . Years of education: College   Occupational History  .  Unemployed   Social History Main Topics  . Smoking  status: Former Smoker    Packs/day: 1.00    Years: 5.00    Types: Cigarettes, Cigars    Quit date: 05/15/1968  . Smokeless tobacco: Never Used  . Alcohol use No  . Drug use: No  . Sexual activity: Yes   Other Topics Concern  . Not on file   Social History Narrative   Patient lives at home with family.   Caffeine Use: occasionally    Family History  Problem Relation Age of Onset  . Cancer Father        prostate  . Heart disease Unknown        No family history     Review of Systems:  See HPI for pertinent ROS. All other ROS negative.    Physical Exam: Blood pressure 110/70, pulse (!) 56, temperature (!) 97.5 F (36.4 C), temperature source Oral, resp. rate 16, height 5\' 6"  (1.676 m), weight 185 lb (83.9 kg), SpO2 98 %., Body mass index is 29.86 kg/m. General:  WNWD WM. Appears in no acute distress. Neck: Supple. No thyromegaly. No lymphadenopathy. No carotid bruits. Lungs: Clear bilaterally to auscultation without wheezes, rales, or rhonchi. Breathing is unlabored. Heart: RRR with S1 S2. No murmurs, rubs, or gallops. Abdomen: Soft, non-tender, non-distended with normoactive bowel sounds. No hepatomegaly. No rebound/guarding. No obvious abdominal masses. Musculoskeletal:  Strength and tone normal for age. Extremities/Skin: Warm and dry.  No edema.  Neuro: Alert and oriented X 3. Moves all extremities spontaneously. Gait is normal. CNII-XII grossly in tact. Psych:  Responds to questions appropriately with a normal affect.     ASSESSMENT AND PLAN:  71 y.o. year old male with  1. Coronary artery disease involving native coronary artery of native heart without angina pectoris Stable. Managed by Cardiology  2. Diabetes mellitus without complication (HCC) Stable, managed by Dr. Buddy Duty  3. Rheumatoid arthritis involving multiple sites with positive rheumatoid factor (HCC) Stable. Managed by Dr. Estanislado Pandy  4. Hyperlipidemia, unspecified hyperlipidemia type Stable. Managed  by Dr. Buddy Duty Addendum added 12/25/2016---- He did bring in copy of his colonoscopy report and copy of his lipid panel. Colonoscopy performed at [facility] on 04/05/2012. Normal. Repeat 10 years. Lipid panel performed 06/27/2016 showed total cholesterol 130,   triglyceride 185,   HDL 39, and LDL 54  In Epic, can see CBC, CMET from 10/03/2016. Also he is having labs by other providerss not on epic.  No labs needed today.  Reviewed Immunizations and colonoscopy--- Discussed that the only immun in epic is Prevnar 46 ---  01/13/2014 He thinks he had Pneumovax 23 by Dr Buddy Duty and thinks colonoscopy was at [facility] are to f/u with getting these records.  Addendum added 12/25/2016---- He did bring in copy of his colonoscopy report and copy of his lipid panel. Colonoscopy performed at [facility] on 04/05/2012. Normal. Repeat 10 years.  F/U for ROV 6 months, sooner if needed.  Signed, [address] [place], [place], [facility] 12/13/2016 10:20 AM

## 2016-12-14 ENCOUNTER — Encounter (HOSPITAL_COMMUNITY): Payer: PRIVATE HEALTH INSURANCE

## 2016-12-15 ENCOUNTER — Other Ambulatory Visit (HOSPITAL_COMMUNITY): Payer: Self-pay | Admitting: *Deleted

## 2016-12-18 ENCOUNTER — Encounter (HOSPITAL_COMMUNITY)
Admission: RE | Admit: 2016-12-18 | Discharge: 2016-12-18 | Disposition: A | Discharge status: Home / Self Care | Payer: Medicare Other | Source: Ambulatory Visit | Attending: Rheumatology | Admitting: Rheumatology

## 2016-12-18 DIAGNOSIS — E1165 Type 2 diabetes mellitus with hyperglycemia: Secondary | ICD-10-CM | POA: Diagnosis not present

## 2016-12-18 DIAGNOSIS — I10 Essential (primary) hypertension: Secondary | ICD-10-CM | POA: Diagnosis not present

## 2016-12-18 DIAGNOSIS — M0579 Rheumatoid arthritis with rheumatoid factor of multiple sites without organ or systems involvement: Secondary | ICD-10-CM | POA: Diagnosis not present

## 2016-12-18 DIAGNOSIS — N182 Chronic kidney disease, stage 2 (mild): Secondary | ICD-10-CM | POA: Diagnosis not present

## 2016-12-18 DIAGNOSIS — E1122 Type 2 diabetes mellitus with diabetic chronic kidney disease: Secondary | ICD-10-CM | POA: Diagnosis not present

## 2016-12-18 DIAGNOSIS — Z794 Long term (current) use of insulin: Secondary | ICD-10-CM | POA: Diagnosis not present

## 2016-12-18 LAB — COMPREHENSIVE METABOLIC PANEL
ALK PHOS: 90 U/L (ref 38–126)
ALT: 24 U/L (ref 17–63)
ANION GAP: 7 (ref 5–15)
AST: 27 U/L (ref 15–41)
Albumin: 3.3 g/dL — ABNORMAL LOW (ref 3.5–5.0)
BUN: 20 mg/dL (ref 6–20)
CALCIUM: 8.9 mg/dL (ref 8.9–10.3)
CHLORIDE: 101 mmol/L (ref 101–111)
CO2: 29 mmol/L (ref 22–32)
CREATININE: 1.06 mg/dL (ref 0.61–1.24)
Glucose, Bld: 227 mg/dL — ABNORMAL HIGH (ref 65–99)
Potassium: 3.9 mmol/L (ref 3.5–5.1)
Sodium: 137 mmol/L (ref 135–145)
Total Bilirubin: 0.6 mg/dL (ref 0.3–1.2)
Total Protein: 6.9 g/dL (ref 6.5–8.1)

## 2016-12-18 LAB — CBC WITH DIFFERENTIAL/PLATELET
Basophils Absolute: 0 10*3/uL (ref 0.0–0.1)
Basophils Relative: 1 %
EOS PCT: 3 %
Eosinophils Absolute: 0.2 10*3/uL (ref 0.0–0.7)
HCT: 35.9 % — ABNORMAL LOW (ref 39.0–52.0)
Hemoglobin: 11.9 g/dL — ABNORMAL LOW (ref 13.0–17.0)
LYMPHS ABS: 1.5 10*3/uL (ref 0.7–4.0)
LYMPHS PCT: 24 %
MCH: 28.6 pg (ref 26.0–34.0)
MCHC: 33.1 g/dL (ref 30.0–36.0)
MCV: 86.3 fL (ref 78.0–100.0)
MONOS PCT: 12 %
Monocytes Absolute: 0.8 10*3/uL (ref 0.1–1.0)
Neutro Abs: 3.9 10*3/uL (ref 1.7–7.7)
Neutrophils Relative %: 60 %
PLATELETS: 316 10*3/uL (ref 150–400)
RBC: 4.16 MIL/uL — AB (ref 4.22–5.81)
RDW: 13.5 % (ref 11.5–15.5)
WBC: 6.3 10*3/uL (ref 4.0–10.5)

## 2016-12-18 MED ORDER — SODIUM CHLORIDE 0.9 % IV SOLN
750.0000 mg | INTRAVENOUS | Status: DC
  Administered 2016-12-18: 12:00:00 750 mg via INTRAVENOUS
  Filled 2016-12-18: qty 30

## 2016-12-18 MED ORDER — ACETAMINOPHEN 325 MG PO TABS: 650.0000 mg | ORAL_TABLET | ORAL | Status: DC

## 2016-12-18 MED ORDER — DIPHENHYDRAMINE HCL 25 MG PO CAPS: 25.0000 mg | ORAL_CAPSULE | ORAL | Status: DC

## 2016-12-18 NOTE — Progress Notes (Signed)
Elevated blood glucose. Please notify pt.

## 2016-12-22 ENCOUNTER — Other Ambulatory Visit: Payer: Self-pay

## 2016-12-22 MED ORDER — PANTOPRAZOLE SODIUM 40 MG PO TBEC: 40.0000 mg | DELAYED_RELEASE_TABLET | Freq: Every day | ORAL | 0 refills | Status: DC

## 2016-12-22 MED ORDER — AMLODIPINE BESYLATE 5 MG PO TABS: 5.0000 mg | ORAL_TABLET | Freq: Every day | ORAL | 0 refills | Status: DC

## 2016-12-22 MED ORDER — PRAVASTATIN SODIUM 40 MG PO TABS: 40.0000 mg | ORAL_TABLET | Freq: Every evening | ORAL | 2 refills | Status: DC

## 2016-12-22 MED ORDER — RAMIPRIL 5 MG PO CAPS: 5.0000 mg | ORAL_CAPSULE | Freq: Every day | ORAL | 0 refills | Status: DC

## 2016-12-22 MED ORDER — HYDROCHLOROTHIAZIDE 25 MG PO TABS: ORAL_TABLET | ORAL | 3 refills | Status: DC

## 2016-12-22 NOTE — Telephone Encounter (Signed)
Refill appropriate 

## 2017-01-02 ENCOUNTER — Other Ambulatory Visit: Payer: Self-pay

## 2017-01-02 MED ORDER — AMLODIPINE BESYLATE 5 MG PO TABS: 5.0000 mg | ORAL_TABLET | Freq: Every day | ORAL | 3 refills | Status: DC

## 2017-01-02 MED ORDER — PRAVASTATIN SODIUM 40 MG PO TABS: 40.0000 mg | ORAL_TABLET | Freq: Every evening | ORAL | 3 refills | Status: DC

## 2017-01-02 MED ORDER — HYDROCHLOROTHIAZIDE 25 MG PO TABS: ORAL_TABLET | ORAL | 3 refills | Status: DC

## 2017-01-02 MED ORDER — PANTOPRAZOLE SODIUM 40 MG PO TBEC: 40.0000 mg | DELAYED_RELEASE_TABLET | Freq: Every day | ORAL | 3 refills | Status: DC

## 2017-01-02 MED ORDER — RAMIPRIL 5 MG PO CAPS: 5.0000 mg | ORAL_CAPSULE | Freq: Every day | ORAL | 3 refills | Status: DC

## 2017-01-02 NOTE — Telephone Encounter (Signed)
Refill appropriate 

## 2017-01-08 ENCOUNTER — Other Ambulatory Visit: Payer: Self-pay | Admitting: Rheumatology

## 2017-01-08 NOTE — Telephone Encounter (Signed)
Last Visit: 11/02/16 Next Visit: 02/02/17 Labs: 12/18/16 Elevated glucose  Okay to refill per Dr. Estanislado Pandy

## 2017-01-17 ENCOUNTER — Other Ambulatory Visit: Payer: Self-pay | Admitting: Radiology

## 2017-01-17 NOTE — Progress Notes (Signed)
Infusion orders are current for patient CBC CMP Tylenol Benadryl appointments are up to date and follow up appointment  is scheduled TB gold not due yet, due on Jan 2019

## 2017-01-18 ENCOUNTER — Encounter (HOSPITAL_COMMUNITY)
Admission: RE | Admit: 2017-01-18 | Discharge: 2017-01-18 | Disposition: A | Discharge status: Home / Self Care | Payer: Medicare Other | Source: Ambulatory Visit | Attending: Rheumatology | Admitting: Rheumatology

## 2017-01-18 DIAGNOSIS — M0579 Rheumatoid arthritis with rheumatoid factor of multiple sites without organ or systems involvement: Secondary | ICD-10-CM | POA: Diagnosis not present

## 2017-01-18 MED ORDER — ACETAMINOPHEN 325 MG PO TABS: 650.0000 mg | ORAL_TABLET | ORAL | Status: DC

## 2017-01-18 MED ORDER — DIPHENHYDRAMINE HCL 25 MG PO CAPS
25.0000 mg | ORAL_CAPSULE | ORAL | Status: DC
Start: 2017-01-18 — End: 2017-01-19

## 2017-01-18 MED ORDER — SODIUM CHLORIDE 0.9 % IV SOLN
750.0000 mg | INTRAVENOUS | Status: DC
  Administered 2017-01-18: 10:00:00 750 mg via INTRAVENOUS
  Filled 2017-01-18: qty 30

## 2017-01-26 NOTE — Progress Notes (Signed)
Office Visit Note  Patient: Joel Hackman Demasi Sr.             Date of Birth: [birth year]           MRN: [id number]             PCP: Rennis Golden Referring: Rennis Golden Visit Date: 02/02/2017 Occupation: _0 @    Subjective:  Arthritis (Doing fair, no refills needed); Hand Pain (Bil numbness, tingling); Leg Pain (Bil leg "jumping" at night during sleep); Hand Pain (More swelling when time for infusion at end of month, bil hands); and Chest Pain (Right rib pain)   History of Present Illness: Joel Grand Tregre Sr. is a 72 y.o. male with history of rheumatoid arthritis. He is been on IV Orencia and Arava combination which is been better for him. He states he gets tingling and numbness in his hands in the evening. He also has swelling in his left ankle in the evening. None of the other joints are causing any discomfort. Overall history better on the current combination of medications.  Activities of Daily Living:  Patient reports morning stiffness for 4-6 hours daily.  Patient Denies nocturnal pain.  Difficulty dressing/grooming: Denies Difficulty climbing stairs: Denies Difficulty getting out of chair: Reports Difficulty using hands for taps, buttons, cutlery, and/or writing: Denies   Review of Systems  Constitutional: Positive for fatigue and weight loss. Negative for appetite change, night sweats and weakness.  HENT: Negative for mouth sores, mouth dryness and nose dryness.   Eyes: Negative for redness and dryness.  Respiratory: Negative for shortness of breath and difficulty breathing.   Cardiovascular: Negative for chest pain, palpitations, hypertension, irregular heartbeat and swelling in legs/feet.  Gastrointestinal: Positive for heartburn. Negative for constipation, diarrhea and nausea.  Endocrine: Positive for increased urination.  Genitourinary: Negative for painful urination.  Musculoskeletal: Positive for arthralgias, joint pain, joint swelling and morning  stiffness. Negative for gait problem, myalgias, muscle weakness, muscle tenderness and myalgias.  Skin: Negative for color change, rash, hair loss, nodules/bumps, skin tightness, ulcers and sensitivity to sunlight.  Allergic/Immunologic: Negative for susceptible to infections.  Neurological: Positive for numbness. Negative for dizziness, fainting, headaches, memory loss and night sweats.  Hematological: Negative for bruising/bleeding tendency and swollen glands.  Psychiatric/Behavioral: Positive for sleep disturbance. Negative for depressed mood. The patient is not nervous/anxious.     PMFS History:  Patient Active Problem List   Diagnosis Date Noted  . DDD (degenerative disc disease), cervical 10/31/2016  . Primary osteoarthritis of both hands 10/31/2016  . Primary osteoarthritis of both feet 10/31/2016  . High risk medication use 07/06/2016  . History of diabetes mellitus 07/06/2016  . History of coronary artery disease 07/06/2016  . Rheumatoid arthritis involving multiple sites with positive rheumatoid factor ([place]) 06/02/2016  . Other fatigue 06/02/2016  . Contracture, elbow, left 06/02/2016  . Swelling of left hand 05/01/2016  . Osteoarthritis of right knee 08/27/2015  . S/P TKR (total knee replacement), left 08/27/2015  . Essential hypertension 05/11/2015  . Pain in the chest   . Right groin pain 08/15/2012  . Chest pain 04/02/2012  . Cerebrovascular disease 04/02/2012  . CAD (coronary artery disease) 04/02/2012  . Diabetes mellitus without complication ([place])   . Dizziness   . Sinus bradycardia   . Hyperlipidemia   . Edema     Past Medical History:  Diagnosis Date  . Anemia   . Arthritis    "all over"  . Bradycardia   .  Chronic bronchitis ([place])    "get it just about q yr" (03/17/2014)  . Daily headache    "here lately" (03/17/2014); relates to sinuses  . GERD (gastroesophageal reflux disease)   . Hard of hearing    hearing aides bilat  . Hiatal hernia   . History  of blood transfusion 2008   "related to OR"  . History of kidney stones   . Hyperlipidemia   . Hypertension   . Pneumonia 1972 X 1  . Type II diabetes mellitus ([place])   . Wears glasses     Family History  Problem Relation Age of Onset  . Cancer Father        prostate  . Heart disease Unknown        No family history   Past Surgical History:  Procedure Laterality Date  . broken finger      surgical repaired left hand 2nd finger   . cyst removed      per left knee/posteriorly  . CYSTECTOMY Left 2009   "behind knee"  . INGUINAL HERNIA REPAIR Right ?2010  . JOINT REPLACEMENT    . KNEE ARTHROSCOPY Bilateral 1980's  . PARTIAL KNEE ARTHROPLASTY Right 08/27/2015   Procedure: RIGHT UNICOMPARTMENTAL KNEE ARTHROPLASTY;  Surgeon: Mcarthur Rossetti, MD;  Location: [facility];  Service: Orthopedics;  Laterality: Right;  . REVISION TOTAL KNEE ARTHROPLASTY Left 2008  . TOTAL KNEE ARTHROPLASTY Left 2003   Social History   Social History Narrative   Patient lives at home with family.   Caffeine Use: occasionally     Objective: Vital Signs: BP 120/61 (BP Location: Left Arm, Patient Position: Sitting, Cuff Size: Normal)   Pulse 60   Resp 17   Ht _0  (1.676 m)   Wt 190 lb (86.2 kg)   BMI 30.67 kg/m    Physical Exam  Constitutional: He is oriented to person, place, and time. He appears well-developed and well-nourished.  HENT:  Head: Normocephalic and atraumatic.  Eyes: Pupils are equal, round, and reactive to light. Conjunctivae and EOM are normal.  Neck: Normal range of motion. Neck supple.  Cardiovascular: Normal rate, regular rhythm and normal heart sounds.   Pulmonary/Chest: Effort normal and breath sounds normal.  Abdominal: Soft. Bowel sounds are normal.  Neurological: He is alert and oriented to person, place, and time.  Skin: Skin is warm and dry. Capillary refill takes less than 2 seconds.  Psychiatric: He has a normal mood and affect. His behavior is normal.    Nursing note and vitals reviewed.    Musculoskeletal Exam: C-spine and thoracic spine good range of motion. He has some discomfort range of motion of his lumbar spine. Shoulder joints elbow joints with good range of motion. His contractures in his elbows have resolved. He has no synovitis over MCP joints. He has DIP PIP thickening bilaterally consistent with osteoarthritis. His right knee partial replacement and left total knee replacement is doing well. He is some tenderness over left ankle joints but no warmth swelling or effusion was noted.  CDAI Exam: CDAI Homunculus Exam:   Tenderness:  LLE: tibiotalar  Joint Counts:  CDAI Tender Joint count: 0 CDAI Swollen Joint count: 0  Global Assessments:  Patient Global Assessment: 5 Provider Global Assessment: 2  CDAI Calculated Score: 7    Investigation: No additional findings.TB Gold: negative in 05/2016 CBC Latest Ref Rng & Units 12/18/2016 10/03/2016 08/02/2016  WBC 4.0 - 10.5 K/uL 6.3 6.4 6.8  Hemoglobin 13.0 - 17.0 g/dL 11.9(L) 13.2  13.2  Hematocrit 39.0 - 52.0 % 35.9(L) 39.3 39.8  Platelets 150 - 400 K/uL 316 487(H) 243   CMP Latest Ref Rng & Units 12/18/2016 10/03/2016 08/02/2016  Glucose 65 - 99 mg/dL 227(H) 156(H) 247(H)  BUN 6 - 20 mg/dL _0 Creatinine 0.61 - 1.24 mg/dL 1.06 1.20 1.13  Sodium 135 - 145 mmol/L 137 133(L) 138  Potassium 3.5 - 5.1 mmol/L 3.9 3.4(L) 3.8  Chloride 101 - 111 mmol/L 101 91(L) 100  CO2 22 - 32 mmol/L _1 Calcium 8.9 - 10.3 mg/dL 8.9 9.6 8.9  Total Protein 6.5 - 8.1 g/dL 6.9 7.0 5.8(L)  Total Bilirubin 0.3 - 1.2 mg/dL 0.6 0.5 0.5  Alkaline Phos 38 - 126 U/L 90 107 66  AST 15 - 41 U/L _2 ALT 17 - 63 U/L _3 Imaging: No results found.  Speciality Comments: No specialty comments available.    Procedures:  No procedures performed Allergies: Invokana [canagliflozin]; Lipitor [atorvastatin]; Losartan; Vicodin [hydrocodone-acetaminophen]; Codeine; and Roxicodone  [oxycodone]   Assessment / Plan:     Visit Diagnoses: Rheumatoid arthritis involving multiple sites with positive rheumatoid factor (HCC) - +RF,+antiCCP, history of elevated ESR. He is doing really well on combination of IV Orencia and Arava. I do not see any synovitis on examination today. He continues to have some joint pain. Which I believe is due to underlying osteoarthritis.  High risk medication use - IV Orencia/ Leflunomide 20 mg po qd (inadequate response to Humira), pilocarpine for sicca symptoms . His labs are stable. We will continue to monitor his labs with his infusions.  Primary osteoarthritis of both hands: Severe disease with chronic pain. Joint protection and muscle strengthening discussed.  Status post right partial knee replacement -: He had no effusion on examination today.  Status post total left knee replacement: Doing well  Primary osteoarthritis of both feet: Chronic discomfort  DDD (degenerative disc disease), cervical: Stiffness  His other medical problems are listed as follows:  Other fatigue  History of diabetes mellitus  History of coronary artery disease  History of hyperlipidemia  History of hypertension  Chronic insomnia    Orders: No orders of the defined types were placed in this encounter.  No orders of the defined types were placed in this encounter.     Follow-Up Instructions: Return in about 4 months (around 06/04/2017) for Rheumatoid arthritis.   Bo Merino, MD  Note - This record has been created using Editor, commissioning.  Chart creation errors have been sought, but [place] not always  have been located. Such creation errors do not reflect on  the standard of medical care.

## 2017-02-02 ENCOUNTER — Ambulatory Visit (INDEPENDENT_AMBULATORY_CARE_PROVIDER_SITE_OTHER): Payer: Medicare Other | Admitting: Rheumatology

## 2017-02-02 ENCOUNTER — Encounter: Payer: Self-pay | Admitting: Rheumatology

## 2017-02-02 VITALS — BP 120/61 | HR 60 | Resp 17 | Ht 66.0 in | Wt 190.0 lb

## 2017-02-02 DIAGNOSIS — M19071 Primary osteoarthritis, right ankle and foot: Secondary | ICD-10-CM

## 2017-02-02 DIAGNOSIS — F5104 Psychophysiologic insomnia: Secondary | ICD-10-CM

## 2017-02-02 DIAGNOSIS — M19041 Primary osteoarthritis, right hand: Secondary | ICD-10-CM | POA: Diagnosis not present

## 2017-02-02 DIAGNOSIS — M5030 Other cervical disc degeneration, unspecified cervical region: Secondary | ICD-10-CM | POA: Diagnosis not present

## 2017-02-02 DIAGNOSIS — I2583 Coronary atherosclerosis due to lipid rich plaque: Secondary | ICD-10-CM

## 2017-02-02 DIAGNOSIS — M0579 Rheumatoid arthritis with rheumatoid factor of multiple sites without organ or systems involvement: Secondary | ICD-10-CM

## 2017-02-02 DIAGNOSIS — Z96652 Presence of left artificial knee joint: Secondary | ICD-10-CM | POA: Diagnosis not present

## 2017-02-02 DIAGNOSIS — Z79899 Other long term (current) drug therapy: Secondary | ICD-10-CM

## 2017-02-02 DIAGNOSIS — I2510 Atherosclerotic heart disease of native coronary artery without angina pectoris: Secondary | ICD-10-CM

## 2017-02-02 DIAGNOSIS — Z96651 Presence of right artificial knee joint: Secondary | ICD-10-CM | POA: Diagnosis not present

## 2017-02-02 DIAGNOSIS — Z8679 Personal history of other diseases of the circulatory system: Secondary | ICD-10-CM

## 2017-02-02 DIAGNOSIS — M19042 Primary osteoarthritis, left hand: Secondary | ICD-10-CM | POA: Diagnosis not present

## 2017-02-02 DIAGNOSIS — R5383 Other fatigue: Secondary | ICD-10-CM | POA: Diagnosis not present

## 2017-02-02 DIAGNOSIS — Z8639 Personal history of other endocrine, nutritional and metabolic disease: Secondary | ICD-10-CM

## 2017-02-02 DIAGNOSIS — M19072 Primary osteoarthritis, left ankle and foot: Secondary | ICD-10-CM

## 2017-02-05 ENCOUNTER — Ambulatory Visit: Payer: Self-pay | Admitting: Physician Assistant

## 2017-02-05 ENCOUNTER — Telehealth: Payer: Self-pay

## 2017-02-05 ENCOUNTER — Other Ambulatory Visit: Payer: Self-pay | Admitting: Rheumatology

## 2017-02-05 NOTE — Telephone Encounter (Signed)
Patient wife called left message that patient had been hurting up in rib cage area all weekend and wanted to make an appointment. I called patient to schedule an appointment lvm on both home and cell number to call return call.

## 2017-02-05 NOTE — Progress Notes (Signed)
Infusion orders are current for patient CBC CMP Tylenol Benadryl appointments are up to date and follow up appointment  is scheduled TB gold not due yet, due on Jan 2019

## 2017-02-07 ENCOUNTER — Encounter: Payer: Self-pay | Admitting: Physician Assistant

## 2017-02-07 ENCOUNTER — Ambulatory Visit (INDEPENDENT_AMBULATORY_CARE_PROVIDER_SITE_OTHER): Payer: Medicare Other | Admitting: Physician Assistant

## 2017-02-07 VITALS — BP 122/70 | HR 57 | Temp 98.2°F | Resp 14 | Ht 66.0 in | Wt 193.2 lb

## 2017-02-07 DIAGNOSIS — T148XXA Other injury of unspecified body region, initial encounter: Secondary | ICD-10-CM

## 2017-02-07 DIAGNOSIS — I2510 Atherosclerotic heart disease of native coronary artery without angina pectoris: Secondary | ICD-10-CM | POA: Diagnosis not present

## 2017-02-07 DIAGNOSIS — I2583 Coronary atherosclerosis due to lipid rich plaque: Secondary | ICD-10-CM

## 2017-02-07 MED ORDER — TRAMADOL HCL 50 MG PO TABS: ORAL_TABLET | ORAL | 0 refills | Status: DC

## 2017-02-07 NOTE — Progress Notes (Signed)
Patient ID: Joel Keltner Staat Sr. MRN: [id number], DOB: 02-Sep-1945, 71 y.o. Date of Encounter: @DATE @  Chief Complaint:  Chief Complaint  Patient presents with  . right side rib cage pain    dealing with for  a couple week    HPI: 71 y.o. year old male  presents with above.   His wife is accompanying him for visit today.  They report that he has been feeling this pain in his right side of for about 3 weeks.  Wife says " he can't even lay flat to sleep in the bed-- has to sleep in the recliner ".  Patient says "at times the pain seems to get better but then I do something to aggravate it and it comes right back."  I asked if he had done anything in particular that he thinks caused the pain.  He says that he has been putting out a lot of mulch in the yard. Was using a big pitchfork. (Involves scooping up having a heavy load and then twisting)  He says that he also has been getting up hay. He is literally the one out there lifting the hay bales and again this involves lifting a heavy load and then twisting motion.  Wife also comments that she noticed that the other day when he was helping fix a lawnmower that she saw him put his hand down to push himself up and then noticed him grimace with pain.  He states that he took 3 ibuprofen this morning. Says he also used some last night but noticed no effect.  He has had no cough no shortness of breath no hemoptysis no fevers or chills.   Past Medical History:  Diagnosis Date  . Anemia   . Arthritis    "all over"  . Bradycardia   . Chronic bronchitis ([place])    "get it just about q yr" (03/17/2014)  . Daily headache    "here lately" (03/17/2014); relates to sinuses  . GERD (gastroesophageal reflux disease)   . Hard of hearing    hearing aides bilat  . Hiatal hernia   . History of blood transfusion 2008   "related to OR"  . History of kidney stones   . Hyperlipidemia   . Hypertension   . Pneumonia 1972 X 1  . Type II diabetes  mellitus ([place])   . Wears glasses      Home Meds: Outpatient Medications Prior to Visit  Medication Sig Dispense Refill  . amLODipine (NORVASC) 5 MG tablet Take 1 tablet (5 mg total) by mouth daily. 90 tablet 3  . aspirin EC 81 MG tablet Take 81 mg by mouth daily.    . hydrochlorothiazide (HYDRODIURIL) 25 MG tablet take 25mg  by mouth daily 90 tablet 3  . insulin aspart (NOVOLOG FLEXPEN) 100 UNIT/ML FlexPen Inject 10 Units into the skin 3 (three) times daily with meals. pump    . KRILL OIL 1000 MG CAPS Take 1,000 mg by mouth daily.    . Lancets (ONETOUCH ULTRASOFT) lancets FOR USE WHEN CHECKING BLOOD SUGAR QID.  3  . leflunomide (ARAVA) 20 MG tablet TAKE 1 TABLET BY MOUTH EVERY DAY 90 tablet 0  . Multiple Vitamin (MULTIVITAMIN WITH MINERALS) TABS Take 1 tablet by mouth daily.    . ONE TOUCH ULTRA TEST test strip USE WHEN CHECKING BLOOD SUGAR QID.  3  . pantoprazole (PROTONIX) 40 MG tablet Take 1 tablet (40 mg total) by mouth daily. 90 tablet 3  . pilocarpine (SALAGEN)  5 MG tablet Take 1 tablet (5 mg total) by mouth 3 (three) times daily as needed. 90 tablet 2  . pravastatin (PRAVACHOL) 40 MG tablet Take 1 tablet (40 mg total) by mouth every evening. 90 tablet 3  . ramipril (ALTACE) 5 MG capsule Take 1 capsule (5 mg total) by mouth daily. 90 capsule 3  . vitamin B-12 (CYANOCOBALAMIN) 1000 MCG tablet Take 1,000 mcg by mouth daily.    . vitamin C (ASCORBIC ACID) 500 MG tablet Take 500 mg by mouth daily.    . vitamin E 400 UNIT capsule Take 400 Units by mouth daily.    Marland Kitchen NOVOLOG 100 UNIT/ML injection INJECT UP TO 65 UNITS PER DAY VIA INSULIN PUMP SUBCUTANEOUSLY  3   No facility-administered medications prior to visit.     Allergies:  Allergies  Allergen Reactions  . Invokana [Canagliflozin] Other (See Comments)    Stomach upset   . Lipitor [Atorvastatin] Other (See Comments)    Muscle aches   . Losartan Nausea And Vomiting  . Vicodin [Hydrocodone-Acetaminophen] Itching  . Codeine  Swelling  . Roxicodone [Oxycodone] Itching    Social History   Social History  . Marital status: Married    Spouse name: Abe People  . Number of children: 3  . Years of education: College   Occupational History  .  Unemployed   Social History Main Topics  . Smoking status: Former Smoker    Packs/day: 1.00    Years: 5.00    Types: Cigarettes, Cigars    Quit date: 05/15/1968  . Smokeless tobacco: Never Used  . Alcohol use No  . Drug use: No  . Sexual activity: Yes   Other Topics Concern  . Not on file   Social History Narrative   Patient lives at home with family.   Caffeine Use: occasionally    Family History  Problem Relation Age of Onset  . Cancer Father        prostate  . Heart disease Unknown        No family history     Review of Systems:  See HPI for pertinent ROS. All other ROS negative.    Physical Exam: Blood pressure 122/70, pulse (!) 57, temperature 98.2 F (36.8 C), temperature source Oral, resp. rate 14, height 5\' 6"  (1.676 m), weight 87.6 kg (193 lb 3.2 oz), SpO2 99 %., Body mass index is 31.18 kg/m. General: WM. Appears in no acute distress. Neck: Supple. No thyromegaly. No lymphadenopathy. Lungs: Clear bilaterally to auscultation without wheezes, rales, or rhonchi. Breathing is unlabored. Heart: RRR with S1 S2. No murmurs, rubs, or gallops. Musculoskeletal:  Strength and tone normal for age. On his right side/right flank at level near bottom of rib cage there is an area of tenderness that is reproducible with palpation. No rash present. Inspection of this area appears normal. No rash no ecchymosis. I had him do a lateral stretch stretching towards the left and this reproduces his pain. Extremities/Skin: Warm and dry.  No rash. Neuro: Alert and oriented X 3. Moves all extremities spontaneously. Gait is normal. CNII-XII grossly in tact. Psych:  Responds to questions appropriately with a normal affect.     ASSESSMENT AND PLAN:  71 y.o. year old male  with  1. Muscle strain Discussed use of prednisone. I was concerned about using prednisone as it would increase his blood sugar and he does have diabetes. However, I still discussed it with them that they both report that he has used this in  the past and at makes him feel really bad. He really does not want to be on prednisone again. Made him feel extremely irritable and felt horrible. His allergy list includes hydrocodone and oxycodone. Will treat with tramadol. Explained and discussed that he must rest this area. Must avoid twisting motion, lifting. He voices understanding and agrees. Also discussed not to drive and operate machinery, etc. while taking this medicine if it makes him feel loopy at all. - traMADol (ULTRAM) 50 MG tablet; Take 2 every 8 hours as needed for pain  Dispense: 60 tablet; Refill: 0   Signed, [address] [place], [place], [facility] 02/07/2017 12:55 PM

## 2017-02-08 ENCOUNTER — Encounter: Payer: Self-pay | Admitting: Family Medicine

## 2017-02-08 ENCOUNTER — Encounter: Payer: Self-pay | Admitting: Physician Assistant

## 2017-02-09 ENCOUNTER — Encounter: Payer: Self-pay | Admitting: Family Medicine

## 2017-02-15 ENCOUNTER — Telehealth: Payer: Self-pay | Admitting: *Deleted

## 2017-02-15 ENCOUNTER — Encounter (HOSPITAL_COMMUNITY)
Admission: RE | Admit: 2017-02-15 | Discharge: 2017-02-15 | Disposition: A | Discharge status: Home / Self Care | Payer: Medicare Other | Source: Ambulatory Visit | Attending: Rheumatology | Admitting: Rheumatology

## 2017-02-15 DIAGNOSIS — M0579 Rheumatoid arthritis with rheumatoid factor of multiple sites without organ or systems involvement: Secondary | ICD-10-CM | POA: Insufficient documentation

## 2017-02-15 LAB — CBC WITH DIFFERENTIAL/PLATELET
BASOS ABS: 0.1 10*3/uL (ref 0.0–0.1)
BASOS PCT: 1 %
EOS ABS: 0.3 10*3/uL (ref 0.0–0.7)
EOS PCT: 4 %
HCT: 38.2 % — ABNORMAL LOW (ref 39.0–52.0)
Hemoglobin: 13.1 g/dL (ref 13.0–17.0)
LYMPHS PCT: 33 %
Lymphs Abs: 2 10*3/uL (ref 0.7–4.0)
MCH: 30 pg (ref 26.0–34.0)
MCHC: 34.3 g/dL (ref 30.0–36.0)
MCV: 87.6 fL (ref 78.0–100.0)
MONO ABS: 0.8 10*3/uL (ref 0.1–1.0)
Monocytes Relative: 14 %
NEUTROS ABS: 2.9 10*3/uL (ref 1.7–7.7)
Neutrophils Relative %: 48 %
PLATELETS: 238 10*3/uL (ref 150–400)
RBC: 4.36 MIL/uL (ref 4.22–5.81)
RDW: 14.3 % (ref 11.5–15.5)
WBC: 6.1 10*3/uL (ref 4.0–10.5)

## 2017-02-15 LAB — COMPREHENSIVE METABOLIC PANEL
ALBUMIN: 3.6 g/dL (ref 3.5–5.0)
ALT: 36 U/L (ref 17–63)
AST: 36 U/L (ref 15–41)
Alkaline Phosphatase: 89 U/L (ref 38–126)
Anion gap: 7 (ref 5–15)
BUN: 21 mg/dL — AB (ref 6–20)
CHLORIDE: 102 mmol/L (ref 101–111)
CO2: 26 mmol/L (ref 22–32)
Calcium: 8.6 mg/dL — ABNORMAL LOW (ref 8.9–10.3)
Creatinine, Ser: 1.14 mg/dL (ref 0.61–1.24)
GFR calc Af Amer: >60 mL/min (ref >60)
Glucose, Bld: 211 mg/dL — ABNORMAL HIGH (ref 65–99)
POTASSIUM: 3.8 mmol/L (ref 3.5–5.1)
SODIUM: 135 mmol/L (ref 135–145)
Total Bilirubin: 0.7 mg/dL (ref 0.3–1.2)
Total Protein: 6.2 g/dL — ABNORMAL LOW (ref 6.5–8.1)

## 2017-02-15 MED ORDER — ACETAMINOPHEN 325 MG PO TABS: 650.0000 mg | ORAL_TABLET | ORAL | Status: DC

## 2017-02-15 MED ORDER — DIPHENHYDRAMINE HCL 25 MG PO CAPS: 25.0000 mg | ORAL_CAPSULE | ORAL | Status: DC

## 2017-02-15 MED ORDER — SODIUM CHLORIDE 0.9 % IV SOLN
750.0000 mg | INTRAVENOUS | Status: AC
  Administered 2017-02-15: 750 mg via INTRAVENOUS
  Filled 2017-02-15: qty 30

## 2017-02-15 MED ORDER — CYCLOBENZAPRINE HCL 10 MG PO TABS: 10.0000 mg | ORAL_TABLET | Freq: Three times a day (TID) | ORAL | 1 refills | Status: DC | PRN

## 2017-02-15 NOTE — Progress Notes (Signed)
Advise ca intake

## 2017-02-15 NOTE — Telephone Encounter (Signed)
Received call from patient spouse, Dalene Seltzer.   Reports that patient has tried to take Tramadol for pain, but it is causing insomnia. Reports that patient has tried muscle relaxer in past for muscle strain and it seemed to help more. Does not know the name of the medication.   Please advise.

## 2017-02-15 NOTE — Telephone Encounter (Signed)
Call placed to patient. LMTRC.  

## 2017-02-15 NOTE — Telephone Encounter (Signed)
Patient wife Dalene Seltzer returned call and made aware.   Prescription sent to pharmacy.

## 2017-02-15 NOTE — Telephone Encounter (Signed)
Flexeril 10 mg 1 by mouth every 8 hour prn-- muscle relaxer--- #30+1. Caution them that this [place] cause drowsiness and-- if so-- then only take at night prior to sleep. Do not take prior to driving etc.

## 2017-02-21 ENCOUNTER — Encounter: Payer: Medicare Other | Attending: Internal Medicine | Admitting: *Deleted

## 2017-02-21 DIAGNOSIS — E1165 Type 2 diabetes mellitus with hyperglycemia: Secondary | ICD-10-CM | POA: Diagnosis not present

## 2017-02-21 DIAGNOSIS — E119 Type 2 diabetes mellitus without complications: Secondary | ICD-10-CM

## 2017-02-21 DIAGNOSIS — Z713 Dietary counseling and surveillance: Secondary | ICD-10-CM | POA: Insufficient documentation

## 2017-02-21 NOTE — Progress Notes (Signed)
Insulin Pump aEvaluation Visit:  Date: 02/21/2017   Appt start time: 0930 end time:  1030.  Assessment:  This patient has DM 2 and their primary concerns today: needs to pick another pump as [facility] will no longer be providing supplies.  Usual physical activity: yard work Last A1c was 8.1%  Patient uses the Sure - T infusion set currently and would like to continue   Patient will be transitioning from Joel Jones to new pump once he and his wife, Joel Jones, decide which one they want.  MEDICATIONS: Novolog in current insulin pump    Intervention:    Showed patient the following pumps: Medtronic, OmniPod, Tandem  Demonstrated pump, insulin reservoir and infusion set options, and button pushing for bolus delivery of insulin through the pump  Patient not interested in CGM at this time, especially since not typically covered by Medicare.  Handouts given during visit include:  Insulin Pump Packet from [employer]  Monitoring/Evaluation:    Patient does want to continue with pursuit of Medtronic 630G insulin pump.  Patient asked me to contact local [place] filling in for Roxann Ripple so they can start the process of obtaining the pump. Contact information provided to the patient.  Once pump is shipped, patient to call this office to set up training.

## 2017-02-23 ENCOUNTER — Other Ambulatory Visit: Payer: Self-pay | Admitting: *Deleted

## 2017-02-23 DIAGNOSIS — M0579 Rheumatoid arthritis with rheumatoid factor of multiple sites without organ or systems involvement: Secondary | ICD-10-CM

## 2017-03-05 ENCOUNTER — Ambulatory Visit (INDEPENDENT_AMBULATORY_CARE_PROVIDER_SITE_OTHER): Payer: Medicare Other

## 2017-03-05 ENCOUNTER — Ambulatory Visit (INDEPENDENT_AMBULATORY_CARE_PROVIDER_SITE_OTHER): Payer: Medicare Other | Admitting: Physician Assistant

## 2017-03-05 ENCOUNTER — Other Ambulatory Visit (INDEPENDENT_AMBULATORY_CARE_PROVIDER_SITE_OTHER): Payer: Self-pay

## 2017-03-05 DIAGNOSIS — R0781 Pleurodynia: Secondary | ICD-10-CM | POA: Diagnosis not present

## 2017-03-05 DIAGNOSIS — I2510 Atherosclerotic heart disease of native coronary artery without angina pectoris: Secondary | ICD-10-CM

## 2017-03-05 DIAGNOSIS — R0789 Other chest pain: Secondary | ICD-10-CM

## 2017-03-05 DIAGNOSIS — G8929 Other chronic pain: Secondary | ICD-10-CM | POA: Diagnosis not present

## 2017-03-05 DIAGNOSIS — M25512 Pain in left shoulder: Secondary | ICD-10-CM

## 2017-03-05 DIAGNOSIS — I2583 Coronary atherosclerosis due to lipid rich plaque: Secondary | ICD-10-CM

## 2017-03-05 NOTE — Progress Notes (Unsigned)
ctchest

## 2017-03-05 NOTE — Progress Notes (Signed)
Office Visit Note   Patient: Joel Royster Lopp Sr.           Date of Birth: [birth year]           MRN: [id number] Visit Date: 03/05/2017              Requested by: Orlena Sheldon, PA-C [address] [place], [place] [address] PCP: Orlena Sheldon, PA-C   Assessment & Plan: Visit Diagnoses:  1. Rib pain   2. Chronic left shoulder pain     Plan: CT scan chest to evaluate for right lower chest pain of unknown etiology.  He will follow-up after the CT to go over results.  In regards to his left shoulder he can do pendulum, Codam and wall crawl exercises as he is done in the past.  Follow-Up Instructions: Return for After CT scan.   Orders:  Orders Placed This Encounter  Procedures  . Large Joint Injection/Arthrocentesis  . XR Ribs Unilateral Right   No orders of the defined types were placed in this encounter.     Procedures: Large Joint Inj Date/Time: 03/05/2017 5:49 PM Performed by: Pete Pelt Authorized by: Pete Pelt   Consent Given by:  Patient Indications:  Pain Location:  Shoulder Site:  L subacromial bursa Needle Size:  22 G Needle Length:  1.5 inches Approach:  Lateral Ultrasound Guidance: No   Fluoroscopic Guidance: No   Arthrogram: No   Medications:  40 mg methylPREDNISolone acetate 40 MG/ML; 3 mL lidocaine 1 % Aspiration Attempted: No   Patient tolerance:  Patient tolerated the procedure well with no immediate complications     Clinical Data: No additional findings.   Subjective: Chief Complaint  Patient presents with  . Left Shoulder - Pain    HPI  [name] comes in today for 2 complaints 1 left shoulder pain.  He has had no injury to the left shoulder.  He did have a injection by Dr. Cam Hai for 4-6 months ago on the left shoulder which helped.  He is also having right lower rib pain for the past month.  He denies any pain with deep breathing or sneezing.  He does have pain with certain movements.  He states pain is not getting better  is keeping him awake at night.  His primary care physician thought it was muscle skeletal and gave him some pain medication which he states did not help. Review of Systems   Objective: Vital Signs: There were no vitals taken for this visit.  Physical Exam  Constitutional: He is oriented to person, place, and time. He appears well-developed and well-nourished. No distress.  Pulmonary/Chest: Effort normal.  Neurological: He is alert and oriented to person, place, and time.  Skin: He is not diaphoretic.  Psychiatric: He has a normal mood and affect.    Ortho Exam Left shoulder positive impingement.  5 out of 5 strength with external and internal rotation against resistance bilateral shoulders.  Positive empty can test on the left negative on the right.  He has tenderness over the posterior ninth and 10th rib region laterally.  There is no rashes skin lesions ulcerations over this area.  No use of accessory muscles with breathing. Specialty Comments:  No specialty comments available.  Imaging: Xr Ribs Unilateral Right  Result Date: 03/05/2017 2 views of the right ribs: No acute fractures no bony abnormalities.  Lungs appear clear.    PMFS History: Patient Active Problem List   Diagnosis  Date Noted  . DDD (degenerative disc disease), cervical 10/31/2016  . Primary osteoarthritis of both hands 10/31/2016  . Primary osteoarthritis of both feet 10/31/2016  . High risk medication use 07/06/2016  . History of diabetes mellitus 07/06/2016  . History of coronary artery disease 07/06/2016  . Rheumatoid arthritis involving multiple sites with positive rheumatoid factor ([place]) 06/02/2016  . Other fatigue 06/02/2016  . Contracture, elbow, left 06/02/2016  . Swelling of left hand 05/01/2016  . Osteoarthritis of right knee 08/27/2015  . S/P TKR (total knee replacement), left 08/27/2015  . Essential hypertension 05/11/2015  . Pain in the chest   . Right groin pain 08/15/2012  . Chest pain  04/02/2012  . Cerebrovascular disease 04/02/2012  . CAD (coronary artery disease) 04/02/2012  . Diabetes mellitus without complication ([place])   . Dizziness   . Sinus bradycardia   . Hyperlipidemia   . Edema    Past Medical History:  Diagnosis Date  . Anemia   . Arthritis    "all over"  . Bradycardia   . Chronic bronchitis ([place])    "get it just about q yr" (03/17/2014)  . Daily headache    "here lately" (03/17/2014); relates to sinuses  . GERD (gastroesophageal reflux disease)   . Hard of hearing    hearing aides bilat  . Hiatal hernia   . History of blood transfusion 2008   "related to OR"  . History of kidney stones   . Hyperlipidemia   . Hypertension   . Pneumonia 1972 X 1  . Type II diabetes mellitus ([place])   . Wears glasses     Family History  Problem Relation Age of Onset  . Cancer Father        prostate  . Heart disease Unknown        No family history    Past Surgical History:  Procedure Laterality Date  . broken finger      surgical repaired left hand 2nd finger   . cyst removed      per left knee/posteriorly  . CYSTECTOMY Left 2009   "behind knee"  . INGUINAL HERNIA REPAIR Right ?2010  . JOINT REPLACEMENT    . KNEE ARTHROSCOPY Bilateral 1980's  . PARTIAL KNEE ARTHROPLASTY Right 08/27/2015   Procedure: RIGHT UNICOMPARTMENTAL KNEE ARTHROPLASTY;  Surgeon: Mcarthur Rossetti, MD;  Location: [facility];  Service: Orthopedics;  Laterality: Right;  . REVISION TOTAL KNEE ARTHROPLASTY Left 2008  . TOTAL KNEE ARTHROPLASTY Left 2003   Social History   Occupational History  .  Unemployed   Social History Main Topics  . Smoking status: Former Smoker    Packs/day: 1.00    Years: 5.00    Types: Cigarettes, Cigars    Quit date: 05/15/1968  . Smokeless tobacco: Never Used  . Alcohol use No  . Drug use: No  . Sexual activity: Yes

## 2017-03-06 MED ORDER — METHYLPREDNISOLONE ACETATE 40 MG/ML IJ SUSP
40.0000 mg | INTRAMUSCULAR | Status: AC | PRN
  Administered 2017-03-05: 40 mg via INTRA_ARTICULAR

## 2017-03-06 MED ORDER — LIDOCAINE HCL 1 % IJ SOLN
3.0000 mL | INTRAMUSCULAR | Status: AC | PRN
  Administered 2017-03-05: 3 mL

## 2017-03-09 ENCOUNTER — Ambulatory Visit
Admission: RE | Admit: 2017-03-09 | Discharge: 2017-03-09 | Disposition: A | Discharge status: Home / Self Care | Payer: Medicare Other | Source: Ambulatory Visit | Attending: Orthopaedic Surgery | Admitting: Orthopaedic Surgery

## 2017-03-09 DIAGNOSIS — R0789 Other chest pain: Secondary | ICD-10-CM

## 2017-03-09 DIAGNOSIS — J9811 Atelectasis: Secondary | ICD-10-CM | POA: Diagnosis not present

## 2017-03-12 ENCOUNTER — Ambulatory Visit (INDEPENDENT_AMBULATORY_CARE_PROVIDER_SITE_OTHER): Payer: Medicare Other | Admitting: Physician Assistant

## 2017-03-12 ENCOUNTER — Encounter (INDEPENDENT_AMBULATORY_CARE_PROVIDER_SITE_OTHER): Payer: Self-pay | Admitting: Physician Assistant

## 2017-03-12 VITALS — Ht 66.0 in | Wt 190.0 lb

## 2017-03-12 DIAGNOSIS — R0781 Pleurodynia: Secondary | ICD-10-CM

## 2017-03-12 DIAGNOSIS — I2510 Atherosclerotic heart disease of native coronary artery without angina pectoris: Secondary | ICD-10-CM | POA: Diagnosis not present

## 2017-03-12 DIAGNOSIS — I2583 Coronary atherosclerosis due to lipid rich plaque: Secondary | ICD-10-CM

## 2017-03-12 DIAGNOSIS — M25512 Pain in left shoulder: Secondary | ICD-10-CM | POA: Diagnosis not present

## 2017-03-12 MED ORDER — CYCLOBENZAPRINE HCL 10 MG PO TABS: 10.0000 mg | ORAL_TABLET | Freq: Every day | ORAL | 0 refills | Status: DC

## 2017-03-12 NOTE — Progress Notes (Signed)
Joel Jones returns today for follow-up of his right-sided rib pain/chest pain.  He underwent a chest CT was here to go over the results.  He also had a left shoulder injection on 03/05/2017 which he states greatly helped with his shoulder pain.  He continues to have pain in his right sided chest with certain maneuvers.  Examination shortness of breath no orthopnea.  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  States ibuprofen does dull the pain. CT of the chest did show mild lower lobe atelectasis bilaterally no pleural effusion or pleural thickening was evident.  There were no no blastic or lytic bone lesion seen.  No rib fractures.  Several mild enlarged lymph nodes were noted of uncertain etiology.  One lymph node in the right para tracheal region which was seen on the previous study was smaller compared to the previous study.  Physical exam bilateral shoulders 5 out of 5 strength with external and internal rotation against resistance impingement testing negative bilaterally.  Negative empty can test bilaterally.  Upper shift extremity strength testing 5 out of 5 throughout.  Did not palpate his rib ribs as this causes him pain on the right.  Impression: Right rib pain most likely muscle skeletal in nature.  Plan: This point time we will have him work on some back exercises handout was given.  He is given a muscle relaxant Flexeril to take at night.  Encouraged him to work on deep breathing exercises daily.  He will return the pain persist or becomes worse.  Questions encouraged and answered

## 2017-03-15 ENCOUNTER — Ambulatory Visit (HOSPITAL_COMMUNITY)
Admission: RE | Admit: 2017-03-15 | Discharge: 2017-03-15 | Disposition: A | Discharge status: Home / Self Care | Payer: Medicare Other | Source: Ambulatory Visit | Attending: Rheumatology | Admitting: Rheumatology

## 2017-03-15 ENCOUNTER — Other Ambulatory Visit: Payer: Self-pay | Admitting: *Deleted

## 2017-03-15 DIAGNOSIS — M0579 Rheumatoid arthritis with rheumatoid factor of multiple sites without organ or systems involvement: Secondary | ICD-10-CM | POA: Insufficient documentation

## 2017-03-15 MED ORDER — DIPHENHYDRAMINE HCL 25 MG PO CAPS
25.0000 mg | ORAL_CAPSULE | ORAL | Status: DC
Start: 2017-03-15 — End: 2017-03-16

## 2017-03-15 MED ORDER — SODIUM CHLORIDE 0.9 % IV SOLN
750.0000 mg | Freq: Once | INTRAVENOUS | Status: AC
  Administered 2017-03-15: 750 mg via INTRAVENOUS
  Filled 2017-03-15: qty 30

## 2017-03-15 MED ORDER — ACETAMINOPHEN 325 MG PO TABS: 650.0000 mg | ORAL_TABLET | ORAL | Status: DC

## 2017-03-15 NOTE — Progress Notes (Signed)
Infusion orders are current for patient CBC CMP Tylenol Benadryl appointments are up to date and follow up appointment  is scheduled TB gold not due yet.  

## 2017-03-21 DIAGNOSIS — N182 Chronic kidney disease, stage 2 (mild): Secondary | ICD-10-CM | POA: Diagnosis not present

## 2017-03-21 DIAGNOSIS — E1122 Type 2 diabetes mellitus with diabetic chronic kidney disease: Secondary | ICD-10-CM | POA: Diagnosis not present

## 2017-03-21 DIAGNOSIS — I10 Essential (primary) hypertension: Secondary | ICD-10-CM | POA: Diagnosis not present

## 2017-03-21 DIAGNOSIS — Z794 Long term (current) use of insulin: Secondary | ICD-10-CM | POA: Diagnosis not present

## 2017-04-06 ENCOUNTER — Other Ambulatory Visit: Payer: Self-pay | Admitting: Rheumatology

## 2017-04-09 NOTE — Telephone Encounter (Signed)
Last visit: 02/02/2017 Next visit: 08/28/2017 Labs: 02/15/2017 low calcium   Okay to refill per Dr. Estanislado Pandy.

## 2017-04-10 ENCOUNTER — Other Ambulatory Visit: Payer: Self-pay | Admitting: *Deleted

## 2017-04-10 DIAGNOSIS — M0579 Rheumatoid arthritis with rheumatoid factor of multiple sites without organ or systems involvement: Secondary | ICD-10-CM

## 2017-04-12 ENCOUNTER — Ambulatory Visit (HOSPITAL_COMMUNITY)
Admission: RE | Admit: 2017-04-12 | Discharge: 2017-04-12 | Disposition: A | Discharge status: Home / Self Care | Payer: Medicare Other | Source: Ambulatory Visit | Attending: Rheumatology | Admitting: Rheumatology

## 2017-04-12 DIAGNOSIS — M0579 Rheumatoid arthritis with rheumatoid factor of multiple sites without organ or systems involvement: Secondary | ICD-10-CM | POA: Diagnosis not present

## 2017-04-12 MED ORDER — DIPHENHYDRAMINE HCL 25 MG PO CAPS
25.0000 mg | ORAL_CAPSULE | ORAL | Status: DC
Start: 2017-04-12 — End: 2017-04-13

## 2017-04-12 MED ORDER — ACETAMINOPHEN 325 MG PO TABS: 650.0000 mg | ORAL_TABLET | ORAL | Status: DC

## 2017-04-12 MED ORDER — SODIUM CHLORIDE 0.9 % IV SOLN
750.0000 mg | INTRAVENOUS | Status: DC
  Administered 2017-04-12: 750 mg via INTRAVENOUS
  Filled 2017-04-12: qty 30

## 2017-04-19 ENCOUNTER — Telehealth: Payer: Self-pay

## 2017-04-19 NOTE — Telephone Encounter (Signed)
Called the patient to verify benefits for 2019. Pt currently received infusions that [facility] require a pre-certification. Patient states that his insurance plans will not change for 2019. Will submit a verification through [employer] online portal for a BIV for 2019. Will update once we receive a response.    Jailine Lieder, [place], CPhT 9:27 AM

## 2017-05-10 ENCOUNTER — Ambulatory Visit (HOSPITAL_COMMUNITY)
Admission: RE | Admit: 2017-05-10 | Discharge: 2017-05-10 | Disposition: A | Discharge status: Home / Self Care | Payer: Medicare Other | Source: Ambulatory Visit | Attending: Rheumatology | Admitting: Rheumatology

## 2017-05-10 DIAGNOSIS — M0579 Rheumatoid arthritis with rheumatoid factor of multiple sites without organ or systems involvement: Secondary | ICD-10-CM

## 2017-05-10 LAB — COMPREHENSIVE METABOLIC PANEL
ALBUMIN: 3.8 g/dL (ref 3.5–5.0)
ALT: 29 U/L (ref 17–63)
ANION GAP: 9 (ref 5–15)
AST: 38 U/L (ref 15–41)
Alkaline Phosphatase: 79 U/L (ref 38–126)
BILIRUBIN TOTAL: 0.9 mg/dL (ref 0.3–1.2)
BUN: 22 mg/dL — AB (ref 6–20)
CHLORIDE: 102 mmol/L (ref 101–111)
CO2: 26 mmol/L (ref 22–32)
Calcium: 8.9 mg/dL (ref 8.9–10.3)
Creatinine, Ser: 1.24 mg/dL (ref 0.61–1.24)
GFR calc Af Amer: >60 mL/min (ref >60)
GFR, EST NON AFRICAN AMERICAN: 57 mL/min — AB (ref >60)
GLUCOSE: 221 mg/dL — AB (ref 65–99)
POTASSIUM: 3.4 mmol/L — AB (ref 3.5–5.1)
Sodium: 137 mmol/L (ref 135–145)
TOTAL PROTEIN: 6.5 g/dL (ref 6.5–8.1)

## 2017-05-10 LAB — CBC
HCT: 38.2 % — ABNORMAL LOW (ref 39.0–52.0)
Hemoglobin: 13.1 g/dL (ref 13.0–17.0)
MCH: 30.8 pg (ref 26.0–34.0)
MCHC: 34.3 g/dL (ref 30.0–36.0)
MCV: 89.7 fL (ref 78.0–100.0)
PLATELETS: 228 10*3/uL (ref 150–400)
RBC: 4.26 MIL/uL (ref 4.22–5.81)
RDW: 13.5 % (ref 11.5–15.5)
WBC: 4.5 10*3/uL (ref 4.0–10.5)

## 2017-05-10 MED ORDER — SODIUM CHLORIDE 0.9 % IV SOLN
750.0000 mg | INTRAVENOUS | Status: DC
  Administered 2017-05-10: 10:00:00 750 mg via INTRAVENOUS
  Filled 2017-05-10: qty 30

## 2017-05-10 MED ORDER — ACETAMINOPHEN 325 MG PO TABS: 650.0000 mg | ORAL_TABLET | ORAL | Status: DC

## 2017-05-10 MED ORDER — DIPHENHYDRAMINE HCL 25 MG PO CAPS: 25.0000 mg | ORAL_CAPSULE | ORAL | Status: DC

## 2017-05-10 NOTE — Progress Notes (Signed)
Potassium is low. Glucose is elevated. GFR is lower but is stable. Okay to continue current treatment. Please notify patient and send results to his PCP.

## 2017-05-15 HISTORY — PX: SHOULDER SURGERY: SHX246

## 2017-05-31 ENCOUNTER — Other Ambulatory Visit: Payer: Self-pay | Admitting: *Deleted

## 2017-05-31 NOTE — Progress Notes (Signed)
Infusion orders are current for patient CBC CMP Tylenol Benadryl appointments are up to date and follow up appointment  is scheduled TB gold is now. Order placed.

## 2017-06-07 ENCOUNTER — Ambulatory Visit (HOSPITAL_COMMUNITY)
Admission: RE | Admit: 2017-06-07 | Discharge: 2017-06-07 | Disposition: A | Discharge status: Home / Self Care | Payer: Medicare Other | Source: Ambulatory Visit | Attending: Rheumatology | Admitting: Rheumatology

## 2017-06-07 DIAGNOSIS — M0579 Rheumatoid arthritis with rheumatoid factor of multiple sites without organ or systems involvement: Secondary | ICD-10-CM | POA: Diagnosis not present

## 2017-06-07 MED ORDER — SODIUM CHLORIDE 0.9 % IV SOLN
750.0000 mg | INTRAVENOUS | Status: DC
  Administered 2017-06-07: 10:00:00 750 mg via INTRAVENOUS
  Filled 2017-06-07: qty 30

## 2017-06-07 MED ORDER — ACETAMINOPHEN 325 MG PO TABS: 650.0000 mg | ORAL_TABLET | ORAL | Status: DC

## 2017-06-07 MED ORDER — DIPHENHYDRAMINE HCL 25 MG PO CAPS: 25.0000 mg | ORAL_CAPSULE | ORAL | Status: DC

## 2017-06-10 LAB — QUANTIFERON-TB GOLD PLUS: QuantiFERON-TB Gold Plus: NEGATIVE

## 2017-06-10 LAB — QUANTIFERON-TB GOLD PLUS (RQFGPL)
QUANTIFERON TB1 AG VALUE: 0.17 [IU]/mL
QuantiFERON Nil Value: 0.19 IU/mL
QuantiFERON TB2 Ag Value: 0.14 IU/mL

## 2017-06-13 ENCOUNTER — Ambulatory Visit (INDEPENDENT_AMBULATORY_CARE_PROVIDER_SITE_OTHER): Payer: Medicare Other | Admitting: Adult Health

## 2017-06-13 ENCOUNTER — Encounter: Payer: Self-pay | Admitting: Adult Health

## 2017-06-13 VITALS — BP 116/68 | HR 52 | Ht 66.0 in | Wt 201.0 lb

## 2017-06-13 DIAGNOSIS — I679 Cerebrovascular disease, unspecified: Secondary | ICD-10-CM

## 2017-06-13 DIAGNOSIS — E7800 Pure hypercholesterolemia, unspecified: Secondary | ICD-10-CM

## 2017-06-13 DIAGNOSIS — I10 Essential (primary) hypertension: Secondary | ICD-10-CM

## 2017-06-13 DIAGNOSIS — M0579 Rheumatoid arthritis with rheumatoid factor of multiple sites without organ or systems involvement: Secondary | ICD-10-CM

## 2017-06-13 DIAGNOSIS — I2510 Atherosclerotic heart disease of native coronary artery without angina pectoris: Secondary | ICD-10-CM

## 2017-06-13 NOTE — Patient Instructions (Signed)
Medication Instructions:  NO CHANGES-Your physician recommends that you continue on your current medications as directed. Please refer to the Current Medication list given to you today.  If you need a refill on your cardiac medications before your next appointment, please call your pharmacy.  Testing/Procedures: Echocardiogram - Your physician has requested that you have an echocardiogram. Echocardiography is a painless test that uses sound waves to create images of your heart. It provides your doctor with information about the size and shape of your heart and how well your heart's chambers and valves are working. This procedure takes approximately one hour. There are no restrictions for this procedure. This will be performed at our [facility] location - [address].  Follow-Up: Your physician wants you to follow-up in: [place] should receive a reminder letter in the mail two months in advance. If you do not receive a letter, please call our office 03-2018 to schedule the 05-2018 follow-up appointment.   Thank you for choosing [facility] at [facility]!!

## 2017-06-13 NOTE — Progress Notes (Signed)
Cardiology Office Note   Date:  06/13/2017   ID:  Joel Boston Shatzer Sr., DOB [birth year], MRN [id number]  PCP:  Orlena Sheldon, PA-C  Cardiologist: Dr. Stanford Breed Chief Complaint  Patient presents with  . Coronary Artery Disease     History of Present Illness: Joel Macera Edmister Sr. is a 72 y.o. male who presents for going assessment and management of coronary artery disease, recent documented cardiac catheterization in 2004 with nonobstructive CAD.  Chest CT in 2013 revealed scattered atherosclerotic calcifications of the aorta and coronary arteries.   A follow-up stress echocardiogram showed normal LV function without evidence of ischemia.  Other history includes hypercholesterolemia, hypertension, bradycardia.  He was last seen in the office on 04/27/2016 by Dr. Stanford Breed without cardiac complaint.  He had complained of myalgias related to statin use.  He was to hold pravastatin for 2 weeks and let us know how he felt off medication.  Since seen last, the patient has been diagnosed with rheumatoid arthritis. The patient is being followed by Dr.Deveonshire infusion for management. He has begun to feel better since being treated for RA. Energy level is still a little low but he is able to move his arms and legs for this time he wasn't able to walk and only shuffle and has been able to hold cups or pencils. This is markedly improved.  Other than generalized fatigue he denies any recurrent chest pain dyspnea on exertion dizziness or medication intolerance.,  Past Medical History:  Diagnosis Date  . Anemia   . Arthritis    "all over"  . Bradycardia   . Chronic bronchitis ([place])    "get it just about q yr" (03/17/2014)  . Daily headache    "here lately" (03/17/2014); relates to sinuses  . GERD (gastroesophageal reflux disease)   . Hard of hearing    hearing aides bilat  . Hiatal hernia   . History of blood transfusion 2008   "related to OR"  . History of kidney stones   . Hyperlipidemia   .  Hypertension   . Pneumonia 1972 X 1  . Type II diabetes mellitus ([place])   . Wears glasses     Past Surgical History:  Procedure Laterality Date  . broken finger      surgical repaired left hand 2nd finger   . cyst removed      per left knee/posteriorly  . CYSTECTOMY Left 2009   "behind knee"  . INGUINAL HERNIA REPAIR Right ?2010  . JOINT REPLACEMENT    . KNEE ARTHROSCOPY Bilateral 1980's  . PARTIAL KNEE ARTHROPLASTY Right 08/27/2015   Procedure: RIGHT UNICOMPARTMENTAL KNEE ARTHROPLASTY;  Surgeon: Mcarthur Rossetti, MD;  Location: [facility];  Service: Orthopedics;  Laterality: Right;  . REVISION TOTAL KNEE ARTHROPLASTY Left 2008  . TOTAL KNEE ARTHROPLASTY Left 2003     Current Outpatient Medications  Medication Sig Dispense Refill  . amLODipine (NORVASC) 5 MG tablet Take 1 tablet (5 mg total) by mouth daily. 90 tablet 3  . aspirin EC 81 MG tablet Take 81 mg by mouth daily.    . hydrochlorothiazide (HYDRODIURIL) 25 MG tablet take 25mg  by mouth daily 90 tablet 3  . insulin aspart (NOVOLOG FLEXPEN) 100 UNIT/ML FlexPen Inject 10 Units into the skin 3 (three) times daily with meals. pump    . KRILL OIL 1000 MG CAPS Take 1,000 mg by mouth daily.    . Lancets (ONETOUCH ULTRASOFT) lancets FOR USE WHEN CHECKING BLOOD SUGAR QID.  3  .  leflunomide (ARAVA) 20 MG tablet TAKE 1 TABLET BY MOUTH EVERY DAY 90 tablet 0  . Multiple Vitamin (MULTIVITAMIN WITH MINERALS) TABS Take 1 tablet by mouth daily.    . ONE TOUCH ULTRA TEST test strip USE WHEN CHECKING BLOOD SUGAR QID.  3  . pantoprazole (PROTONIX) 40 MG tablet Take 1 tablet (40 mg total) by mouth daily. 90 tablet 3  . pilocarpine (SALAGEN) 5 MG tablet Take 1 tablet (5 mg total) by mouth 3 (three) times daily as needed. 90 tablet 2  . pravastatin (PRAVACHOL) 40 MG tablet Take 1 tablet (40 mg total) by mouth every evening. 90 tablet 3  . ramipril (ALTACE) 5 MG capsule Take 1 capsule (5 mg total) by mouth daily. 90 capsule 3  . vitamin B-12  (CYANOCOBALAMIN) 1000 MCG tablet Take 1,000 mcg by mouth daily.    . vitamin C (ASCORBIC ACID) 500 MG tablet Take 500 mg by mouth daily.    . vitamin E 400 UNIT capsule Take 400 Units by mouth daily.     No current facility-administered medications for this visit.     Allergies:   Invokana [canagliflozin]; Lipitor [atorvastatin]; Losartan; Vicodin [hydrocodone-acetaminophen]; Codeine; and Roxicodone [oxycodone]    Social History:  The patient  reports that he quit smoking about 49 years ago. His smoking use included cigarettes and cigars. He has a 5.00 pack-year smoking history. he has never used smokeless tobacco. He reports that he does not drink alcohol or use drugs.   Family History:  The patient's family history includes Cancer in his father; Heart disease in his unknown relative.    ROS: All other systems are reviewed and negative. Unless otherwise mentioned in H&P    PHYSICAL EXAM: VS:  BP 116/68   Pulse (!) 52   Ht 5\' 6"  (1.676 m)   Wt 201 lb (91.2 kg)   BMI 32.44 kg/m  , BMI Body mass index is 32.44 kg/m. GEN: Well nourished, well developed, in no acute distress  HEENT: normal  Neck: no JVD, carotid bruits, or masses Cardiac:RRR; no murmurs, rubs, or gallops,no edema  Respiratory:  Clear to auscultation bilaterally, normal work of breathing,mildly diminished in the right base. GI: soft, nontender, nondistended, + BS MS: no deformity or atrophy  Skin: warm and dry, no rash Neuro:  Strength and sensation are intact Psych: euthymic mood, full affect   EKG:  Normal sinus rhythm, occasional PACs, heart rate of 54 bpm.  Recent Labs: 05/10/2017: ALT 29; BUN 22; Creatinine, Ser 1.24; Hemoglobin 13.1; Platelets 228; Potassium 3.4; Sodium 137    Lipid Panel    Component Value Date/Time   CHOL 130 06/27/2016   TRIG 185 (A) 06/27/2016   HDL 39 06/27/2016   CHOLHDL 4 05/14/2012 0735   VLDL 18.6 05/14/2012 0735   LDLCALC 54 06/27/2016      Wt Readings from Last 3  Encounters:  06/13/17 201 lb (91.2 kg)  06/07/17 180 lb (81.6 kg)  05/10/17 195 lb (88.5 kg)      Other studies Reviewed: NM Stress Test Apr 04, 2014 IMPRESSION: 1. No scintigraphic evidence of prior infarction or pharmacologically induced ischemia.  2. Normal left ventricular wall motion.  3. Left ventricular ejection fraction 63%  4. Low-risk stress test findings*.   ASSESSMENT AND PLAN:  1.  Coronary artery disease: Nonobstructive CAD per cardiac catheterization in 2004,stress echocardiogram in 2015 was normal and not indicative of ischemia. The patient does have fatigue but denied chest pain or dyspnea on exertion. Will not make  any changes in medication, nor will I wonder ischemic testing at this time.  He has frequent labs per primary care, along with his neurologist.  2. Hyper cholesterolemia: The patient remains on statin therapy. Myalgias and discomfort have been now diagnosed as rheumatoid arthritis.  3. Hypertension: Blood pressure is very well controlled today. Will not make any changes on his medications. However, in the setting of rheumatoid arthritis, would like to have his echocardiogram checked. Prior echo to evaluate for any changes in his valve status of LV function.   Current medicines are reviewed at length with the patient today.    Labs/ tests ordered today include: echocardiogram  Phill Myron. West Pugh, ANP, AACC   06/13/2017 10:37 AM    [facility] [address], [place],  [address] Phone: [phone]; Fax: [phone]

## 2017-06-18 ENCOUNTER — Ambulatory Visit (INDEPENDENT_AMBULATORY_CARE_PROVIDER_SITE_OTHER): Payer: Medicare Other | Admitting: Physician Assistant

## 2017-06-18 ENCOUNTER — Other Ambulatory Visit: Payer: Self-pay

## 2017-06-18 ENCOUNTER — Encounter: Payer: Self-pay | Admitting: Physician Assistant

## 2017-06-18 VITALS — BP 116/68 | HR 62 | Temp 97.5°F | Resp 14 | Wt 201.6 lb

## 2017-06-18 DIAGNOSIS — I10 Essential (primary) hypertension: Secondary | ICD-10-CM

## 2017-06-18 DIAGNOSIS — M19041 Primary osteoarthritis, right hand: Secondary | ICD-10-CM

## 2017-06-18 DIAGNOSIS — M1711 Unilateral primary osteoarthritis, right knee: Secondary | ICD-10-CM | POA: Diagnosis not present

## 2017-06-18 DIAGNOSIS — M19071 Primary osteoarthritis, right ankle and foot: Secondary | ICD-10-CM | POA: Diagnosis not present

## 2017-06-18 DIAGNOSIS — E785 Hyperlipidemia, unspecified: Secondary | ICD-10-CM

## 2017-06-18 DIAGNOSIS — M5030 Other cervical disc degeneration, unspecified cervical region: Secondary | ICD-10-CM

## 2017-06-18 DIAGNOSIS — M0579 Rheumatoid arthritis with rheumatoid factor of multiple sites without organ or systems involvement: Secondary | ICD-10-CM | POA: Diagnosis not present

## 2017-06-18 DIAGNOSIS — M19072 Primary osteoarthritis, left ankle and foot: Secondary | ICD-10-CM | POA: Diagnosis not present

## 2017-06-18 DIAGNOSIS — E119 Type 2 diabetes mellitus without complications: Secondary | ICD-10-CM | POA: Diagnosis not present

## 2017-06-18 DIAGNOSIS — R001 Bradycardia, unspecified: Secondary | ICD-10-CM

## 2017-06-18 DIAGNOSIS — M19042 Primary osteoarthritis, left hand: Secondary | ICD-10-CM

## 2017-06-18 DIAGNOSIS — I2510 Atherosclerotic heart disease of native coronary artery without angina pectoris: Secondary | ICD-10-CM | POA: Diagnosis not present

## 2017-06-18 NOTE — Progress Notes (Signed)
Patient ID: Joel Sans Platner Sr. MRN: [id number], DOB: 11-24-1945, 72 y.o. Date of Encounter: @DATE @  Chief Complaint:  No chief complaint on file.   HPI: 72 y.o. year old male     12/13/2016: presents as a New Pt to Establish care.   His wife also had OV with me earlier today to Establish Care.  Their prior PCP sent letter that they were moving.  They have family members who come here.  Their land [place] near [place] B[place] in-laws land.   [name] sees the following specialists: Dr. Buddy Duty --- Diabetes & Cholesterol --- he manages this also Dr. Estanislado Pandy -- RA Dr Alexis Goodell Dr. Stanford Breed -- Cardiololgy  Reports thinks last Medicare Physical was ~ 2 years ago.   Has no specific concerns to address today.   Addendum added 12/25/2016---- He did bring in copy of his colonoscopy report and copy of his lipid panel. Colonoscopy performed at [facility] on 04/05/2012. Normal. Repeat 10 years. Lipid panel performed 06/27/2016 showed total cholesterol 130,   triglyceride 185,   HDL 39, and LDL 54    06/18/2017: He has no specific concerns to address today. He continues to follow-up with all of the specialists that he was seeing at prior visit. He is taking diabetic medications as directed and is seen Dr. Buddy Duty routinely for labs and visits. He is taking cholesterol medication as directed.  Seeing Dr. Buddy Duty for visits and labs routinely. He is seeing Dr. Estanislado Pandy routinely and she is managing medications for his rheumatoid arthritis. He is seeing Dr. Stanford Breed for Cardiology routinely and is taking these medications as directed.      Past Medical History:  Diagnosis Date  . Anemia   . Arthritis    "all over"  . Bradycardia   . Chronic bronchitis ([place])    "get it just about q yr" (03/17/2014)  . Daily headache    "here lately" (03/17/2014); relates to sinuses  . GERD (gastroesophageal reflux disease)   . Hard of hearing    hearing aides bilat  . Hiatal hernia   . History of blood transfusion  2008   "related to OR"  . History of kidney stones   . Hyperlipidemia   . Hypertension   . Pneumonia 1972 X 1  . Type II diabetes mellitus ([place])   . Wears glasses      Home Meds: Outpatient Medications Prior to Visit  Medication Sig Dispense Refill  . amLODipine (NORVASC) 5 MG tablet Take 1 tablet (5 mg total) by mouth daily. 90 tablet 3  . aspirin EC 81 MG tablet Take 81 mg by mouth daily.    . hydrochlorothiazide (HYDRODIURIL) 25 MG tablet take 25mg  by mouth daily 90 tablet 3  . insulin aspart (NOVOLOG FLEXPEN) 100 UNIT/ML FlexPen Inject 10 Units into the skin 3 (three) times daily with meals. pump    . KRILL OIL 1000 MG CAPS Take 1,000 mg by mouth daily.    . Lancets (ONETOUCH ULTRASOFT) lancets FOR USE WHEN CHECKING BLOOD SUGAR QID.  3  . leflunomide (ARAVA) 20 MG tablet TAKE 1 TABLET BY MOUTH EVERY DAY 90 tablet 0  . Multiple Vitamin (MULTIVITAMIN WITH MINERALS) TABS Take 1 tablet by mouth daily.    . ONE TOUCH ULTRA TEST test strip USE WHEN CHECKING BLOOD SUGAR QID.  3  . pantoprazole (PROTONIX) 40 MG tablet Take 1 tablet (40 mg total) by mouth daily. 90 tablet 3  . pilocarpine (SALAGEN) 5 MG tablet Take 1 tablet (  5 mg total) by mouth 3 (three) times daily as needed. 90 tablet 2  . pravastatin (PRAVACHOL) 40 MG tablet Take 1 tablet (40 mg total) by mouth every evening. 90 tablet 3  . ramipril (ALTACE) 5 MG capsule Take 1 capsule (5 mg total) by mouth daily. 90 capsule 3  . vitamin B-12 (CYANOCOBALAMIN) 1000 MCG tablet Take 1,000 mcg by mouth daily.    . vitamin C (ASCORBIC ACID) 500 MG tablet Take 500 mg by mouth daily.    . vitamin E 400 UNIT capsule Take 400 Units by mouth daily.     No facility-administered medications prior to visit.     Allergies:  Allergies  Allergen Reactions  . Invokana [Canagliflozin] Other (See Comments)    Stomach upset   . Lipitor [Atorvastatin] Other (See Comments)    Muscle aches   . Losartan Nausea And Vomiting  . Vicodin  [Hydrocodone-Acetaminophen] Itching  . Codeine Swelling  . Roxicodone [Oxycodone] Itching    Social History   Socioeconomic History  . Marital status: Married    Spouse name: Abe People  . Number of children: 3  . Years of education: College  . Highest education level: Not on file  Social Needs  . Financial resource strain: Not on file  . Food insecurity - worry: Not on file  . Food insecurity - inability: Not on file  . Transportation needs - medical: Not on file  . Transportation needs - non-medical: Not on file  Occupational History    Employer: UNEMPLOYED  Tobacco Use  . Smoking status: Former Smoker    Packs/day: 1.00    Years: 5.00    Pack years: 5.00    Types: Cigarettes, Cigars    Last attempt to quit: 05/15/1968    Years since quitting: 49.1  . Smokeless tobacco: Never Used  Substance and Sexual Activity  . Alcohol use: No    Alcohol/week: 0.0 oz  . Drug use: No  . Sexual activity: Yes  Other Topics Concern  . Not on file  Social History Narrative   Patient lives at home with family.   Caffeine Use: occasionally    Family History  Problem Relation Age of Onset  . Cancer Father        prostate  . Heart disease Unknown        No family history     Review of Systems:  See HPI for pertinent ROS. All other ROS negative.    Physical Exam: Blood pressure 116/68, pulse 62, temperature (!) 97.5 F (36.4 C), temperature source Oral, resp. rate 14, weight 91.4 kg (201 lb 9.6 oz), SpO2 95 %., There is no height or weight on file to calculate BMI. General:  WNWD WM. Appears in no acute distress. Neck: Supple. No thyromegaly. No lymphadenopathy. No carotid bruits. Lungs: Clear bilaterally to auscultation without wheezes, rales, or rhonchi. Breathing is unlabored. Heart: RRR with S1 S2. No murmurs, rubs, or gallops. Abdomen: Soft, non-tender, non-distended with normoactive bowel sounds. No hepatomegaly. No rebound/guarding. No obvious abdominal masses. Musculoskeletal:   Strength and tone normal for age. Extremities/Skin: Warm and dry.  No edema.  Neuro: Alert and oriented X 3. Moves all extremities spontaneously. Gait is normal. CNII-XII grossly in tact. Psych:  Responds to questions appropriately with a normal affect.     ASSESSMENT AND PLAN:  72 y.o. year old male with  1. Coronary artery disease involving native coronary artery of native heart without angina pectoris 06/18/2017: Stable. Managed by Cardiology  2.  Diabetes mellitus without complication ([place]) 10/14/9507: Stable, managed by Dr. Buddy Duty  3. Rheumatoid arthritis involving multiple sites with positive rheumatoid factor ([place]) 06/18/2017: Stable. Managed by Dr. Estanislado Pandy  4. Hyperlipidemia, unspecified hyperlipidemia type 06/18/2017: Stable. Managed by Dr. Buddy Duty  Addendum added 12/25/2016---- He did bring in copy of his colonoscopy report and copy of his lipid panel.  Colonoscopy performed at [facility] on 04/05/2012. Normal. Repeat 10 years.  Lipid panel performed 06/27/2016 showed total cholesterol 130,   triglyceride 185,   HDL 39, and LDL 54  In Epic, can see CBC, CMET from 10/03/2016. Also he is having labs by other providerss not on epic.  No labs needed today.  Reviewed Immunizations and colonoscopy--- Discussed that the only immun in epic is Prevnar 13 --- 01/13/2014 He thinks he had Pneumovax 23 by Dr Buddy Duty and thinks colonoscopy was at [facility] are to f/u with getting these records.   Addendum added 12/25/2016---- He did bring in copy of his colonoscopy report and copy of his lipid panel.  Colonoscopy performed at [facility] on 04/05/2012. Normal. Repeat 10 years.   06/18/2017: They report that last CPE was 1 year ago and they are interested in scheduling another CPE.  They will wait for their calendars to slow down some with other appointments then they will schedule CPE. F/U for ROV 6 months, sooner if needed.  Signed, [address] [place], [place], [facility] 06/18/2017 8:06 AM

## 2017-06-20 ENCOUNTER — Ambulatory Visit (HOSPITAL_COMMUNITY): Payer: Medicare Other | Attending: Cardiology

## 2017-06-20 ENCOUNTER — Other Ambulatory Visit: Payer: Self-pay

## 2017-06-20 DIAGNOSIS — I10 Essential (primary) hypertension: Secondary | ICD-10-CM | POA: Diagnosis not present

## 2017-06-20 DIAGNOSIS — M0579 Rheumatoid arthritis with rheumatoid factor of multiple sites without organ or systems involvement: Secondary | ICD-10-CM | POA: Insufficient documentation

## 2017-06-20 DIAGNOSIS — E119 Type 2 diabetes mellitus without complications: Secondary | ICD-10-CM | POA: Insufficient documentation

## 2017-06-20 DIAGNOSIS — I2510 Atherosclerotic heart disease of native coronary artery without angina pectoris: Secondary | ICD-10-CM | POA: Insufficient documentation

## 2017-06-20 DIAGNOSIS — I679 Cerebrovascular disease, unspecified: Secondary | ICD-10-CM | POA: Insufficient documentation

## 2017-06-22 ENCOUNTER — Other Ambulatory Visit: Payer: Self-pay | Admitting: *Deleted

## 2017-06-22 NOTE — Progress Notes (Signed)
Infusion orders are current for patient CBC CMP Tylenol Benadryl appointments are up to date and follow up appointment  is scheduled TB gold not due yet.  

## 2017-06-22 NOTE — Progress Notes (Signed)
Office Visit Note  Patient: Joel Paredez Carreker Sr.             Date of Birth: [birth year]           MRN: [id number]             PCP: Rennis Golden Referring: Orlena Sheldon, PA-C Visit Date: 07/03/2017 Occupation: '@GUAROCC' @    Subjective:  Shoulder pain   History of Present Illness: Joel Starace Wagster Sr. is a 72 y.o. male with history of seropositive rheumatoid arthritis.  He states recently has been having increased joint pain and stiffness.  He complains of pain and discomfort in his left shoulder.  He also has intermittent swelling in his hands.  He has been on IV Orencia.  He feels the symptoms flare prior to his next IV Orencia.  Patient reports that he fell about 3 weeks ago and ended up on his left arm.  He has been having pain and discomfort in his left shoulder for the last 1 week.  Activities of Daily Living:  Patient reports morning stiffness for 4-5 hours.   Patient Denies nocturnal pain.  Difficulty dressing/grooming: Denies Difficulty climbing stairs: Reports Difficulty getting out of chair: Reports Difficulty using hands for taps, buttons, cutlery, and/or writing: Reports   Review of Systems  Constitutional: Positive for fatigue. Negative for night sweats and weakness ( ).  HENT: Positive for mouth dryness. Negative for mouth sores and nose dryness.   Eyes: Negative for redness and dryness.  Respiratory: Negative for shortness of breath and difficulty breathing.   Cardiovascular: Negative for chest pain, palpitations, hypertension, irregular heartbeat and swelling in legs/feet.  Gastrointestinal: Negative for constipation and diarrhea.  Endocrine: Negative for increased urination.  Musculoskeletal: Positive for arthralgias, joint pain, joint swelling and morning stiffness. Negative for myalgias, muscle weakness, muscle tenderness and myalgias.  Skin: Negative for color change, rash, hair loss, nodules/bumps, skin tightness, ulcers and sensitivity to sunlight.    Allergic/Immunologic: Negative for susceptible to infections.  Neurological: Negative for dizziness, fainting, memory loss and night sweats.  Hematological: Negative for swollen glands.  Psychiatric/Behavioral: Positive for sleep disturbance. Negative for depressed mood. The patient is not nervous/anxious.     PMFS History:  Patient Active Problem List   Diagnosis Date Noted  . Acute pain of left shoulder 03/12/2017  . Rib pain 03/12/2017  . DDD (degenerative disc disease), cervical 10/31/2016  . Primary osteoarthritis of both hands 10/31/2016  . Primary osteoarthritis of both feet 10/31/2016  . High risk medication use 07/06/2016  . History of diabetes mellitus 07/06/2016  . History of coronary artery disease 07/06/2016  . Rheumatoid arthritis involving multiple sites with positive rheumatoid factor ([place]) 06/02/2016  . Other fatigue 06/02/2016  . Contracture, elbow, left 06/02/2016  . Swelling of left hand 05/01/2016  . Osteoarthritis of right knee 08/27/2015  . S/P TKR (total knee replacement), left 08/27/2015  . Essential hypertension 05/11/2015  . Pain in the chest   . Right groin pain 08/15/2012  . Chest pain 04/02/2012  . Cerebrovascular disease 04/02/2012  . CAD (coronary artery disease) 04/02/2012  . Diabetes mellitus without complication ([place])   . Dizziness   . Sinus bradycardia   . Hyperlipidemia   . Edema     Past Medical History:  Diagnosis Date  . Anemia   . Arthritis    "all over"  . Bradycardia   . Chronic bronchitis ([place])    "get it just about q yr" (03/17/2014)  .  Daily headache    "here lately" (03/17/2014); relates to sinuses  . GERD (gastroesophageal reflux disease)   . Hard of hearing    hearing aides bilat  . Hiatal hernia   . History of blood transfusion 2008   "related to OR"  . History of kidney stones   . Hyperlipidemia   . Hypertension   . Pneumonia 1972 X 1  . Type II diabetes mellitus ([place])   . Wears glasses     Family History   Problem Relation Age of Onset  . Cancer Father        prostate  . Heart disease Unknown        No family history   Past Surgical History:  Procedure Laterality Date  . broken finger      surgical repaired left hand 2nd finger   . cyst removed      per left knee/posteriorly  . CYSTECTOMY Left 2009   "behind knee"  . INGUINAL HERNIA REPAIR Right ?2010  . JOINT REPLACEMENT    . KNEE ARTHROSCOPY Bilateral 1980's  . PARTIAL KNEE ARTHROPLASTY Right 08/27/2015   Procedure: RIGHT UNICOMPARTMENTAL KNEE ARTHROPLASTY;  Surgeon: Mcarthur Rossetti, MD;  Location: [facility];  Service: Orthopedics;  Laterality: Right;  . REVISION TOTAL KNEE ARTHROPLASTY Left 2008  . TOTAL KNEE ARTHROPLASTY Left 2003   Social History   Social History Narrative   Patient lives at home with family.   Caffeine Use: occasionally     Objective: Vital Signs: BP 119/64 (BP Location: Left Arm, Patient Position: Sitting, Cuff Size: Normal)   Pulse (!) 50   Resp 16   Ht '5\' 6"'  (1.676 m)   Wt 204 lb (92.5 kg)   BMI 32.93 kg/m    Physical Exam  Constitutional: He is oriented to person, place, and time. He appears well-developed and well-nourished.  HENT:  Head: Normocephalic and atraumatic.  Eyes: Conjunctivae and EOM are normal. Pupils are equal, round, and reactive to light.  Neck: Normal range of motion. Neck supple.  Cardiovascular: Normal rate, regular rhythm and normal heart sounds.  Pulmonary/Chest: Effort normal and breath sounds normal.  Abdominal: Soft. Bowel sounds are normal.  Neurological: He is alert and oriented to person, place, and time.  Skin: Skin is warm and dry. Capillary refill takes less than 2 seconds.  Psychiatric: He has a normal mood and affect. His behavior is normal.  Nursing note and vitals reviewed.    Musculoskeletal Exam: C-spine thoracic lumbar spine good range of motion.  He has some stiffness with range of motion of his C-spine.  He has limited painful range of motion  of his left shoulder.  Elbow joints wrist joints with good range of motion.  He has no synovitis over MCPs.  He has severe osteoarthritis involving his PIP/DIP joints with complete fist formation.  His right partial and left total knee replacement is doing fairly well with some discomfort on range of motion.  CDAI Exam: CDAI Homunculus Exam:   Tenderness:  LUE: glenohumeral RLE: tibiofemoral LLE: tibiofemoral  Joint Counts:  CDAI Tender Joint count: 3 CDAI Swollen Joint count: 0  Global Assessments:  Patient Global Assessment: 5 Provider Global Assessment: 3  CDAI Calculated Score: 11    Investigation: No additional findings.TB Gold: 06/07/2017 Negative  CBC Latest Ref Rng & Units 05/10/2017 02/15/2017 12/18/2016  WBC 4.0 - 10.5 K/uL 4.5 6.1 6.3  Hemoglobin 13.0 - 17.0 g/dL 13.1 13.1 11.9(L)  Hematocrit 39.0 - 52.0 % 38.2(L) 38.2(L) 35.9(L)  Platelets 150 - 400 K/uL 228 238 316   CMP Latest Ref Rng & Units 05/10/2017 02/15/2017 12/18/2016  Glucose 65 - 99 mg/dL 221(H) 211(H) 227(H)  BUN 6 - 20 mg/dL 22(H) 21(H) 20  Creatinine 0.61 - 1.24 mg/dL 1.24 1.14 1.06  Sodium 135 - 145 mmol/L 137 135 137  Potassium 3.5 - 5.1 mmol/L 3.4(L) 3.8 3.9  Chloride 101 - 111 mmol/L 102 102 101  CO2 22 - 32 mmol/L '26 26 29  ' Calcium 8.9 - 10.3 mg/dL 8.9 8.6(L) 8.9  Total Protein 6.5 - 8.1 g/dL 6.5 6.2(L) 6.9  Total Bilirubin 0.3 - 1.2 mg/dL 0.9 0.7 0.6  Alkaline Phos 38 - 126 U/L 79 89 90  AST 15 - 41 U/L 38 36 27  ALT 17 - 63 U/L 29 36 24    Imaging: Xr Humerus Left  Result Date: 07/03/2017 No cortical abnormality was noted.  There was no evidence of fracture.  Unremarkable x-ray of the humerus.  Xr Shoulder Left  Result Date: 07/03/2017 Glenohumeral joint space narrowing was noted.  Type II acromion was noted.  No chondrocalcinosis was noted. Impression unremarkable x-ray of the shoulder joint.   Speciality Comments: Orencia 721m every 4 weeks TB gold negative on Jan  2018    Procedures:  No procedures performed Allergies: Invokana [canagliflozin]; Lipitor [atorvastatin]; Losartan; Vicodin [hydrocodone-acetaminophen]; Codeine; and Roxicodone [oxycodone]   Assessment / Plan:     Visit Diagnoses: Rheumatoid arthritis involving multiple sites with positive rheumatoid factor (HCC) - +RF,+antiCCP, history of elevated ESR.  He had no synovitis on examination today.  He has been on IV Orencia and leflunomide which in my opinion has been controlling his symptoms well.  High risk medication use - IVOrencia/ Leflunomide 20 mg po qd (inadequate response to Humira), pilocarpine for sicca symptoms.  His labs are stable.  Acute pain of left shoulder -patient had recent fall.  Plan: XR Shoulder Left.  The x-ray of the shoulder joint was unremarkable.  I offered physical therapy which she declined.  I would avoid cortisone injection as he is diabetic.  I have given him a handout on shoulder joint exercises.  Left arm pain - Plan: XR Humerus Left.  The x-ray was unremarkable.  Primary osteoarthritis of both hands: He does have severe osteoarthritis in his hands with incomplete fist formation and discomfort.  Primary osteoarthritis of both feet: Chronic pain  S/P TKR (total knee replacement), left: Chronic pain  Status post right partial knee replacement chronic pain  DDD (degenerative disc disease), cervical continues to have discomfort in his lower back off and on.  Other fatigue  Chronic insomnia  History of diabetes mellitus: He is on insulin pump.  History of hypertension  History of coronary artery disease  History of hyperlipidemia    Orders: Orders Placed This Encounter  Procedures  . XR Shoulder Left  . XR Humerus Left   No orders of the defined types were placed in this encounter.   Face-to-face time spent with patient was 30 minutes.  Greater than 50% of time was spent in counseling and coordination of care.  Follow-Up Instructions:  Return in about 5 months (around 11/30/2017) for Rheumatoid arthritis, Osteoarthritis.   SBo Merino MD  Note - This record has been created using DEditor, commissioning  Chart creation errors have been sought, but [place] not always  have been located. Such creation errors do not reflect on  the standard of medical care.

## 2017-06-25 ENCOUNTER — Ambulatory Visit: Payer: Medicare Other | Admitting: Rheumatology

## 2017-06-26 DIAGNOSIS — E1122 Type 2 diabetes mellitus with diabetic chronic kidney disease: Secondary | ICD-10-CM | POA: Diagnosis not present

## 2017-06-26 DIAGNOSIS — N182 Chronic kidney disease, stage 2 (mild): Secondary | ICD-10-CM | POA: Diagnosis not present

## 2017-06-26 DIAGNOSIS — Z794 Long term (current) use of insulin: Secondary | ICD-10-CM | POA: Diagnosis not present

## 2017-06-26 DIAGNOSIS — I10 Essential (primary) hypertension: Secondary | ICD-10-CM | POA: Diagnosis not present

## 2017-06-28 ENCOUNTER — Ambulatory Visit: Payer: Medicare Other | Admitting: Rheumatology

## 2017-07-03 ENCOUNTER — Ambulatory Visit (INDEPENDENT_AMBULATORY_CARE_PROVIDER_SITE_OTHER): Payer: Medicare Other

## 2017-07-03 ENCOUNTER — Ambulatory Visit (INDEPENDENT_AMBULATORY_CARE_PROVIDER_SITE_OTHER): Payer: Medicare Other | Admitting: Rheumatology

## 2017-07-03 ENCOUNTER — Encounter: Payer: Self-pay | Admitting: Rheumatology

## 2017-07-03 VITALS — BP 119/64 | HR 50 | Resp 16 | Ht 66.0 in | Wt 204.0 lb

## 2017-07-03 DIAGNOSIS — Z96651 Presence of right artificial knee joint: Secondary | ICD-10-CM | POA: Diagnosis not present

## 2017-07-03 DIAGNOSIS — M19041 Primary osteoarthritis, right hand: Secondary | ICD-10-CM | POA: Diagnosis not present

## 2017-07-03 DIAGNOSIS — M19071 Primary osteoarthritis, right ankle and foot: Secondary | ICD-10-CM | POA: Diagnosis not present

## 2017-07-03 DIAGNOSIS — Z79899 Other long term (current) drug therapy: Secondary | ICD-10-CM | POA: Diagnosis not present

## 2017-07-03 DIAGNOSIS — R5383 Other fatigue: Secondary | ICD-10-CM

## 2017-07-03 DIAGNOSIS — M79602 Pain in left arm: Secondary | ICD-10-CM

## 2017-07-03 DIAGNOSIS — M5030 Other cervical disc degeneration, unspecified cervical region: Secondary | ICD-10-CM | POA: Diagnosis not present

## 2017-07-03 DIAGNOSIS — Z8639 Personal history of other endocrine, nutritional and metabolic disease: Secondary | ICD-10-CM

## 2017-07-03 DIAGNOSIS — M25512 Pain in left shoulder: Secondary | ICD-10-CM | POA: Diagnosis not present

## 2017-07-03 DIAGNOSIS — F5104 Psychophysiologic insomnia: Secondary | ICD-10-CM | POA: Diagnosis not present

## 2017-07-03 DIAGNOSIS — Z8679 Personal history of other diseases of the circulatory system: Secondary | ICD-10-CM

## 2017-07-03 DIAGNOSIS — M0579 Rheumatoid arthritis with rheumatoid factor of multiple sites without organ or systems involvement: Secondary | ICD-10-CM

## 2017-07-03 DIAGNOSIS — M19042 Primary osteoarthritis, left hand: Secondary | ICD-10-CM

## 2017-07-03 DIAGNOSIS — I2510 Atherosclerotic heart disease of native coronary artery without angina pectoris: Secondary | ICD-10-CM

## 2017-07-03 DIAGNOSIS — Z96652 Presence of left artificial knee joint: Secondary | ICD-10-CM | POA: Diagnosis not present

## 2017-07-03 DIAGNOSIS — M19072 Primary osteoarthritis, left ankle and foot: Secondary | ICD-10-CM

## 2017-07-03 NOTE — Patient Instructions (Signed)
Shoulder Exercises Ask your health care provider which exercises are safe for you. Do exercises exactly as told by your health care provider and adjust them as directed. It is normal to feel mild stretching, pulling, tightness, or discomfort as you do these exercises, but you should stop right away if you feel sudden pain or your pain gets worse.Do not begin these exercises until told by your health care provider. RANGE OF MOTION EXERCISES These exercises warm up your muscles and joints and improve the movement and flexibility of your shoulder. These exercises also help to relieve pain, numbness, and tingling. These exercises involve stretching your injured shoulder directly. Exercise A: Pendulum  1. Stand near a wall or a surface that you can hold onto for balance. 2. Bend at the waist and let your left / right arm hang straight down. Use your other arm to support you. Keep your back straight and do not lock your knees. 3. Relax your left / right arm and shoulder muscles, and move your hips and your trunk so your left / right arm swings freely. Your arm should swing because of the motion of your body, not because you are using your arm or shoulder muscles. 4. Keep moving your body so your arm swings in the following directions, as told by your health care provider: ? Side to side. ? Forward and backward. ? In clockwise and counterclockwise circles. 5. Continue each motion for __________ seconds, or for as long as told by your health care provider. 6. Slowly return to the starting position. Repeat __________ times. Complete this exercise __________ times a day. Exercise B:Flexion, Standing  1. Stand and hold a broomstick, a cane, or a similar object. Place your hands a little more than shoulder-width apart on the object. Your left / right hand should be palm-up, and your other hand should be palm-down. 2. Keep your elbow straight and keep your shoulder muscles relaxed. Push the stick down with  your healthy arm to raise your left / right arm in front of your body, and then over your head until you feel a stretch in your shoulder. ? Avoid shrugging your shoulder while you raise your arm. Keep your shoulder blade tucked down toward the middle of your back. 3. Hold for __________ seconds. 4. Slowly return to the starting position. Repeat __________ times. Complete this exercise __________ times a day. Exercise C: Abduction, Standing 1. Stand and hold a broomstick, a cane, or a similar object. Place your hands a little more than shoulder-width apart on the object. Your left / right hand should be palm-up, and your other hand should be palm-down. 2. While keeping your elbow straight and your shoulder muscles relaxed, push the stick across your body toward your left / right side. Raise your left / right arm to the side of your body and then over your head until you feel a stretch in your shoulder. ? Do not raise your arm above shoulder height, unless your health care provider tells you to do that. ? Avoid shrugging your shoulder while you raise your arm. Keep your shoulder blade tucked down toward the middle of your back. 3. Hold for __________ seconds. 4. Slowly return to the starting position. Repeat __________ times. Complete this exercise __________ times a day. Exercise D:Internal Rotation  1. Place your left / right hand behind your back, palm-up. 2. Use your other hand to dangle an exercise band, a towel, or a similar object over your shoulder. Grasp the band with   your left / right hand so you are holding onto both ends. 3. Gently pull up on the band until you feel a stretch in the front of your left / right shoulder. ? Avoid shrugging your shoulder while you raise your arm. Keep your shoulder blade tucked down toward the middle of your back. 4. Hold for __________ seconds. 5. Release the stretch by letting go of the band and lowering your hands. Repeat __________ times. Complete  this exercise __________ times a day. STRETCHING EXERCISES These exercises warm up your muscles and joints and improve the movement and flexibility of your shoulder. These exercises also help to relieve pain, numbness, and tingling. These exercises are done using your healthy shoulder to help stretch the muscles of your injured shoulder. Exercise E: Corner Stretch (External Rotation and Abduction)  1. Stand in a doorway with one of your feet slightly in front of the other. This is called a staggered stance. If you cannot reach your forearms to the door frame, stand facing a corner of a room. 2. Choose one of the following positions as told by your health care provider: ? Place your hands and forearms on the door frame above your head. ? Place your hands and forearms on the door frame at the height of your head. ? Place your hands on the door frame at the height of your elbows. 3. Slowly move your weight onto your front foot until you feel a stretch across your chest and in the front of your shoulders. Keep your head and chest upright and keep your abdominal muscles tight. 4. Hold for __________ seconds. 5. To release the stretch, shift your weight to your back foot. Repeat __________ times. Complete this stretch __________ times a day. Exercise F:Extension, Standing 1. Stand and hold a broomstick, a cane, or a similar object behind your back. ? Your hands should be a little wider than shoulder-width apart. ? Your palms should face away from your back. 2. Keeping your elbows straight and keeping your shoulder muscles relaxed, move the stick away from your body until you feel a stretch in your shoulder. ? Avoid shrugging your shoulders while you move the stick. Keep your shoulder blade tucked down toward the middle of your back. 3. Hold for __________ seconds. 4. Slowly return to the starting position. Repeat __________ times. Complete this exercise __________ times a day. STRENGTHENING  EXERCISES These exercises build strength and endurance in your shoulder. Endurance is the ability to use your muscles for a long time, even after they get tired. Exercise G:External Rotation  1. Sit in a stable chair without armrests. 2. Secure an exercise band at elbow height on your left / right side. 3. Place a soft object, such as a folded towel or a small pillow, between your left / right upper arm and your body to move your elbow a few inches away (about 10 cm) from your side. 4. Hold the end of the band so it is tight and there is no slack. 5. Keeping your elbow pressed against the soft object, move your left / right forearm out, away from your abdomen. Keep your body steady so only your forearm moves. 6. Hold for __________ seconds. 7. Slowly return to the starting position. Repeat __________ times. Complete this exercise __________ times a day. Exercise H:Shoulder Abduction  1. Sit in a stable chair without armrests, or stand. 2. Hold a __________ weight in your left / right hand, or hold an exercise band with both hands.   3. Start with your arms straight down and your left / right palm facing in, toward your body. 4. Slowly lift your left / right hand out to your side. Do not lift your hand above shoulder height unless your health care provider tells you that this is safe. ? Keep your arms straight. ? Avoid shrugging your shoulder while you do this movement. Keep your shoulder blade tucked down toward the middle of your back. 5. Hold for __________ seconds. 6. Slowly lower your arm, and return to the starting position. Repeat __________ times. Complete this exercise __________ times a day. Exercise I:Shoulder Extension 1. Sit in a stable chair without armrests, or stand. 2. Secure an exercise band to a stable object in front of you where it is at shoulder height. 3. Hold one end of the exercise band in each hand. Your palms should face each other. 4. Straighten your elbows and  lift your hands up to shoulder height. 5. Step back, away from the secured end of the exercise band, until the band is tight and there is no slack. 6. Squeeze your shoulder blades together as you pull your hands down to the sides of your thighs. Stop when your hands are straight down by your sides. Do not let your hands go behind your body. 7. Hold for __________ seconds. 8. Slowly return to the starting position. Repeat __________ times. Complete this exercise __________ times a day. Exercise J:Standing Shoulder Row 1. Sit in a stable chair without armrests, or stand. 2. Secure an exercise band to a stable object in front of you so it is at waist height. 3. Hold one end of the exercise band in each hand. Your palms should be in a thumbs-up position. 4. Bend each of your elbows to an "L" shape (about 90 degrees) and keep your upper arms at your sides. 5. Step back until the band is tight and there is no slack. 6. Slowly pull your elbows back behind you. 7. Hold for __________ seconds. 8. Slowly return to the starting position. Repeat __________ times. Complete this exercise __________ times a day. Exercise K:Shoulder Press-Ups  1. Sit in a stable chair that has armrests. Sit upright, with your feet flat on the floor. 2. Put your hands on the armrests so your elbows are bent and your fingers are pointing forward. Your hands should be about even with the sides of your body. 3. Push down on the armrests and use your arms to lift yourself off of the chair. Straighten your elbows and lift yourself up as much as you comfortably can. ? Move your shoulder blades down, and avoid letting your shoulders move up toward your ears. ? Keep your feet on the ground. As you get stronger, your feet should support less of your body weight as you lift yourself up. 4. Hold for __________ seconds. 5. Slowly lower yourself back into the chair. Repeat __________ times. Complete this exercise __________ times a  day. Exercise L: Wall Push-Ups  1. Stand so you are facing a stable wall. Your feet should be about one arm-length away from the wall. 2. Lean forward and place your palms on the wall at shoulder height. 3. Keep your feet flat on the floor as you bend your elbows and lean forward toward the wall. 4. Hold for __________ seconds. 5. Straighten your elbows to push yourself back to the starting position. Repeat __________ times. Complete this exercise __________ times a day. This information is not intended to replace advice   given to you by your health care provider. Make sure you discuss any questions you have with your health care provider. Document Released: 03/15/2005 Document Revised: 01/24/2016 Document Reviewed: 01/10/2015 Elsevier Interactive Patient Education  2018 [employer]  

## 2017-07-05 ENCOUNTER — Ambulatory Visit (HOSPITAL_COMMUNITY)
Admission: RE | Admit: 2017-07-05 | Discharge: 2017-07-05 | Disposition: A | Discharge status: Home / Self Care | Payer: Medicare Other | Source: Ambulatory Visit | Attending: Rheumatology | Admitting: Rheumatology

## 2017-07-05 ENCOUNTER — Encounter (HOSPITAL_COMMUNITY): Payer: Self-pay

## 2017-07-05 ENCOUNTER — Telehealth: Payer: Self-pay | Admitting: Rheumatology

## 2017-07-05 DIAGNOSIS — M0579 Rheumatoid arthritis with rheumatoid factor of multiple sites without organ or systems involvement: Secondary | ICD-10-CM

## 2017-07-05 LAB — COMPREHENSIVE METABOLIC PANEL
ALK PHOS: 89 U/L (ref 38–126)
ALT: 106 U/L — AB (ref 17–63)
AST: 88 U/L — AB (ref 15–41)
Albumin: 4.2 g/dL (ref 3.5–5.0)
Anion gap: 11 (ref 5–15)
BUN: 19 mg/dL (ref 6–20)
CHLORIDE: 100 mmol/L — AB (ref 101–111)
CO2: 25 mmol/L (ref 22–32)
CREATININE: 1.24 mg/dL (ref 0.61–1.24)
Calcium: 9.2 mg/dL (ref 8.9–10.3)
GFR calc Af Amer: >60 mL/min (ref >60)
GFR calc non Af Amer: 57 mL/min — ABNORMAL LOW (ref >60)
GLUCOSE: 230 mg/dL — AB (ref 65–99)
Potassium: 3.9 mmol/L (ref 3.5–5.1)
SODIUM: 136 mmol/L (ref 135–145)
Total Bilirubin: 1.1 mg/dL (ref 0.3–1.2)
Total Protein: 6.8 g/dL (ref 6.5–8.1)

## 2017-07-05 LAB — CBC
HCT: 42.9 % (ref 39.0–52.0)
HEMOGLOBIN: 14.7 g/dL (ref 13.0–17.0)
MCH: 31.1 pg (ref 26.0–34.0)
MCHC: 34.3 g/dL (ref 30.0–36.0)
MCV: 90.9 fL (ref 78.0–100.0)
Platelets: 232 10*3/uL (ref 150–400)
RBC: 4.72 MIL/uL (ref 4.22–5.81)
RDW: 12.7 % (ref 11.5–15.5)
WBC: 4.8 10*3/uL (ref 4.0–10.5)

## 2017-07-05 MED ORDER — ACETAMINOPHEN 325 MG PO TABS: 650.0000 mg | ORAL_TABLET | ORAL | Status: DC

## 2017-07-05 MED ORDER — SODIUM CHLORIDE 0.9 % IV SOLN
750.0000 mg | INTRAVENOUS | Status: DC
  Administered 2017-07-05: 750 mg via INTRAVENOUS
  Filled 2017-07-05: qty 30

## 2017-07-05 MED ORDER — DIPHENHYDRAMINE HCL 25 MG PO CAPS: 25.0000 mg | ORAL_CAPSULE | ORAL | Status: DC

## 2017-07-05 NOTE — Telephone Encounter (Signed)
Patient's wife advised patient should stop Arava and have LFTs rechecked next week .

## 2017-07-05 NOTE — Telephone Encounter (Signed)
Patient's wife Dalene Seltzer called stating that she was driving in the car when you gave her the name of the medication for R[place]ins and she forgot what you said.  Patient requested to call her back when you get a chance.

## 2017-07-05 NOTE — Progress Notes (Signed)
Elevated LFTs probably due to use of Arava and statins together.  Please discontinue Arava and recheck LFTs in 1 month.  We Rathel be able to start on low-dose Arava.

## 2017-07-18 ENCOUNTER — Other Ambulatory Visit: Payer: Self-pay | Admitting: *Deleted

## 2017-07-18 NOTE — Progress Notes (Signed)
Infusion orders are current for patient CBC CMP Tylenol Benadryl appointments are up to date and follow up appointment  is scheduled TB gold not due yet.  

## 2017-07-23 ENCOUNTER — Encounter (INDEPENDENT_AMBULATORY_CARE_PROVIDER_SITE_OTHER): Payer: Self-pay | Admitting: Physician Assistant

## 2017-07-23 ENCOUNTER — Ambulatory Visit (INDEPENDENT_AMBULATORY_CARE_PROVIDER_SITE_OTHER): Payer: Medicare Other | Admitting: Physician Assistant

## 2017-07-23 VITALS — Ht 66.0 in | Wt 201.0 lb

## 2017-07-23 DIAGNOSIS — M25512 Pain in left shoulder: Secondary | ICD-10-CM

## 2017-07-23 DIAGNOSIS — I2510 Atherosclerotic heart disease of native coronary artery without angina pectoris: Secondary | ICD-10-CM | POA: Diagnosis not present

## 2017-07-23 NOTE — Progress Notes (Signed)
Office Visit Note   Patient: Joel Fricker Seavey Sr.           Date of Birth: [birth year]           MRN: [id number] Visit Date: 07/23/2017              Requested by: Orlena Sheldon, PA-C [address] [place], [place] [address] PCP: Orlena Sheldon, PA-C   Assessment & Plan: Visit Diagnoses:  1. Acute pain of left shoulder     Plan: We will send him to physical therapy for range of motion strengthening left shoulder.  They are to include modalities and teach him a home exercise program.  Would refrain from cortisone injection due to patient's poor control of his diabetes.  Plan follow-up if his pain persists despite doing physical therapy or becomes worse.  Questions encouraged and answered at length.  Follow-Up Instructions: Return if symptoms worsen or fail to improve.   Orders:  No orders of the defined types were placed in this encounter.  No orders of the defined types were placed in this encounter.     Procedures: No procedures performed   Clinical Data: No additional findings.   Subjective: Chief Complaint  Patient presents with  . Left Shoulder - Pain    HPI Mr. Lippman is well-known to Dr. Ninfa Linden service comes in today for left shoulder pain.  He was seen by Dr._on 07/03/2017 and radiographs of the shoulder were taken independently reviewed these and these showed no acute fractures no bony abnormalities the glenohumeral joint is well-maintained.  Subacromial space is well-maintained.  Did fall over a month ago and since then has had pain in his left shoulder.  Has pain with overhead activity.  He denies any numbness tingling down the left arm.  He has no pain with range of motion of the cervical spine. Review of Systems Denies radicular pain down either arm.  Denies any neck pain.  Please see HPI otherwise negative Objective: Vital Signs: Ht 5\' 6"  (1.676 m)   Wt 201 lb (91.2 kg)   BMI 32.44 kg/m   Physical Exam  Constitutional: He is oriented to person, place, and  time. He appears well-developed and well-nourished. No distress.  Pulmonary/Chest: Effort normal.  Neurological: He is alert and oriented to person, place, and time.  Skin: He is not diaphoretic.    Ortho Exam Cervical spine good range of motion without pain.  He is nontender over the paraspinous region of the cervical spine and over the spinal column.  5 out of 5 strength bilateral shoulders with external and internal rotation against resistance.  Empty can test and liftoff test negative bilaterally.  Positive impingement on the left negative on the right.  Forward flexion to approximately 130 degrees actively on the left passively I can bring him to 180 degrees.  Specialty Comments:  No specialty comments available.  Imaging: No results found.   PMFS History: Patient Active Problem List   Diagnosis Date Noted  . Acute pain of left shoulder 03/12/2017  . Rib pain 03/12/2017  . DDD (degenerative disc disease), cervical 10/31/2016  . Primary osteoarthritis of both hands 10/31/2016  . Primary osteoarthritis of both feet 10/31/2016  . High risk medication use 07/06/2016  . History of diabetes mellitus 07/06/2016  . History of coronary artery disease 07/06/2016  . Rheumatoid arthritis involving multiple sites with positive rheumatoid factor ([place]) 06/02/2016  . Other fatigue 06/02/2016  . Contracture, elbow, left 06/02/2016  .  Swelling of left hand 05/01/2016  . Osteoarthritis of right knee 08/27/2015  . S/P TKR (total knee replacement), left 08/27/2015  . Essential hypertension 05/11/2015  . Pain in the chest   . Right groin pain 08/15/2012  . Chest pain 04/02/2012  . Cerebrovascular disease 04/02/2012  . CAD (coronary artery disease) 04/02/2012  . Diabetes mellitus without complication ([place])   . Dizziness   . Sinus bradycardia   . Hyperlipidemia   . Edema    Past Medical History:  Diagnosis Date  . Anemia   . Arthritis    "all over"  . Bradycardia   . Chronic bronchitis  ([place])    "get it just about q yr" (03/17/2014)  . Daily headache    "here lately" (03/17/2014); relates to sinuses  . GERD (gastroesophageal reflux disease)   . Hard of hearing    hearing aides bilat  . Hiatal hernia   . History of blood transfusion 2008   "related to OR"  . History of kidney stones   . Hyperlipidemia   . Hypertension   . Pneumonia 1972 X 1  . Type II diabetes mellitus ([place])   . Wears glasses     Family History  Problem Relation Age of Onset  . Cancer Father        prostate  . Heart disease Unknown        No family history    Past Surgical History:  Procedure Laterality Date  . broken finger      surgical repaired left hand 2nd finger   . cyst removed      per left knee/posteriorly  . CYSTECTOMY Left 2009   "behind knee"  . INGUINAL HERNIA REPAIR Right ?2010  . JOINT REPLACEMENT    . KNEE ARTHROSCOPY Bilateral 1980's  . PARTIAL KNEE ARTHROPLASTY Right 08/27/2015   Procedure: RIGHT UNICOMPARTMENTAL KNEE ARTHROPLASTY;  Surgeon: Mcarthur Rossetti, MD;  Location: [facility];  Service: Orthopedics;  Laterality: Right;  . REVISION TOTAL KNEE ARTHROPLASTY Left 2008  . TOTAL KNEE ARTHROPLASTY Left 2003   Social History   Occupational History    Employer: UNEMPLOYED  Tobacco Use  . Smoking status: Former Smoker    Packs/day: 1.00    Years: 5.00    Pack years: 5.00    Types: Cigarettes, Cigars    Last attempt to quit: 05/15/1968    Years since quitting: 49.2  . Smokeless tobacco: Never Used  Substance and Sexual Activity  . Alcohol use: No    Alcohol/week: 0.0 oz  . Drug use: No  . Sexual activity: Yes

## 2017-08-02 ENCOUNTER — Encounter (HOSPITAL_COMMUNITY)
Admission: RE | Admit: 2017-08-02 | Discharge: 2017-08-02 | Disposition: A | Discharge status: Home / Self Care | Payer: Medicare Other | Source: Ambulatory Visit | Attending: Rheumatology | Admitting: Rheumatology

## 2017-08-02 ENCOUNTER — Encounter (HOSPITAL_COMMUNITY): Payer: Self-pay

## 2017-08-02 DIAGNOSIS — M0579 Rheumatoid arthritis with rheumatoid factor of multiple sites without organ or systems involvement: Secondary | ICD-10-CM | POA: Insufficient documentation

## 2017-08-02 MED ORDER — DIPHENHYDRAMINE HCL 25 MG PO CAPS: 25.0000 mg | ORAL_CAPSULE | ORAL | Status: DC

## 2017-08-02 MED ORDER — ABATACEPT 250 MG IV SOLR
750.0000 mg | INTRAVENOUS | Status: DC
  Administered 2017-08-02: 750 mg via INTRAVENOUS
  Filled 2017-08-02: qty 30

## 2017-08-02 MED ORDER — ACETAMINOPHEN 325 MG PO TABS: 650.0000 mg | ORAL_TABLET | ORAL | Status: DC

## 2017-08-07 ENCOUNTER — Telehealth: Payer: Self-pay | Admitting: Rheumatology

## 2017-08-07 NOTE — Telephone Encounter (Signed)
Patient's wife called stating that Joel Jones has been off the Leflunomide for a month per Dr. Arlean Hopping request and wants to know if and when he should restart the medication.

## 2017-08-08 NOTE — Telephone Encounter (Signed)
Advised Joel Jones that we would be unable to let her know when [name] can restart the medication until after he has his labs redrawn and we see what his LFTs are. He will have an infusion in April and will have labs at that time.

## 2017-08-16 DIAGNOSIS — M25512 Pain in left shoulder: Secondary | ICD-10-CM | POA: Diagnosis not present

## 2017-08-16 DIAGNOSIS — M6281 Muscle weakness (generalized): Secondary | ICD-10-CM | POA: Diagnosis not present

## 2017-08-16 DIAGNOSIS — M7542 Impingement syndrome of left shoulder: Secondary | ICD-10-CM | POA: Diagnosis not present

## 2017-08-20 DIAGNOSIS — M25512 Pain in left shoulder: Secondary | ICD-10-CM | POA: Diagnosis not present

## 2017-08-20 DIAGNOSIS — M6281 Muscle weakness (generalized): Secondary | ICD-10-CM | POA: Diagnosis not present

## 2017-08-20 DIAGNOSIS — M7542 Impingement syndrome of left shoulder: Secondary | ICD-10-CM | POA: Diagnosis not present

## 2017-08-24 ENCOUNTER — Other Ambulatory Visit: Payer: Self-pay | Admitting: *Deleted

## 2017-08-24 DIAGNOSIS — M25512 Pain in left shoulder: Secondary | ICD-10-CM | POA: Diagnosis not present

## 2017-08-24 DIAGNOSIS — M6281 Muscle weakness (generalized): Secondary | ICD-10-CM | POA: Diagnosis not present

## 2017-08-24 DIAGNOSIS — M7542 Impingement syndrome of left shoulder: Secondary | ICD-10-CM | POA: Diagnosis not present

## 2017-08-24 NOTE — Progress Notes (Signed)
Infusion orders are current for patient CBC CMP Tylenol Benadryl appointments are up to date and follow up appointment  is scheduled TB gold not due yet.  

## 2017-08-27 DIAGNOSIS — M6281 Muscle weakness (generalized): Secondary | ICD-10-CM | POA: Diagnosis not present

## 2017-08-27 DIAGNOSIS — M7542 Impingement syndrome of left shoulder: Secondary | ICD-10-CM | POA: Diagnosis not present

## 2017-08-27 DIAGNOSIS — M25512 Pain in left shoulder: Secondary | ICD-10-CM | POA: Diagnosis not present

## 2017-08-28 ENCOUNTER — Ambulatory Visit: Payer: Medicare Other | Admitting: Rheumatology

## 2017-08-30 ENCOUNTER — Ambulatory Visit (HOSPITAL_COMMUNITY)
Admission: RE | Admit: 2017-08-30 | Discharge: 2017-08-30 | Disposition: A | Discharge status: Home / Self Care | Payer: Medicare Other | Source: Ambulatory Visit | Attending: Rheumatology | Admitting: Rheumatology

## 2017-08-30 DIAGNOSIS — M0579 Rheumatoid arthritis with rheumatoid factor of multiple sites without organ or systems involvement: Secondary | ICD-10-CM

## 2017-08-30 LAB — COMPREHENSIVE METABOLIC PANEL
ALBUMIN: 4 g/dL (ref 3.5–5.0)
ALT: 28 U/L (ref 17–63)
AST: 31 U/L (ref 15–41)
Alkaline Phosphatase: 76 U/L (ref 38–126)
Anion gap: 9 (ref 5–15)
BUN: 18 mg/dL (ref 6–20)
CHLORIDE: 100 mmol/L — AB (ref 101–111)
CO2: 26 mmol/L (ref 22–32)
CREATININE: 1.21 mg/dL (ref 0.61–1.24)
Calcium: 8.9 mg/dL (ref 8.9–10.3)
GFR calc Af Amer: >60 mL/min (ref >60)
GFR calc non Af Amer: 59 mL/min — ABNORMAL LOW (ref >60)
GLUCOSE: 205 mg/dL — AB (ref 65–99)
POTASSIUM: 4.1 mmol/L (ref 3.5–5.1)
Sodium: 135 mmol/L (ref 135–145)
Total Bilirubin: 0.8 mg/dL (ref 0.3–1.2)
Total Protein: 6.7 g/dL (ref 6.5–8.1)

## 2017-08-30 LAB — CBC
HCT: 40 % (ref 39.0–52.0)
Hemoglobin: 13.6 g/dL (ref 13.0–17.0)
MCH: 30.4 pg (ref 26.0–34.0)
MCHC: 34 g/dL (ref 30.0–36.0)
MCV: 89.3 fL (ref 78.0–100.0)
PLATELETS: 239 10*3/uL (ref 150–400)
RBC: 4.48 MIL/uL (ref 4.22–5.81)
RDW: 12.9 % (ref 11.5–15.5)
WBC: 5.4 10*3/uL (ref 4.0–10.5)

## 2017-08-30 MED ORDER — SODIUM CHLORIDE 0.9 % IV SOLN
750.0000 mg | INTRAVENOUS | Status: DC
  Administered 2017-08-30: 11:00:00 750 mg via INTRAVENOUS
  Filled 2017-08-30: qty 30

## 2017-08-30 MED ORDER — ACETAMINOPHEN 325 MG PO TABS: 650.0000 mg | ORAL_TABLET | ORAL | Status: DC

## 2017-08-30 MED ORDER — DIPHENHYDRAMINE HCL 25 MG PO CAPS: 25.0000 mg | ORAL_CAPSULE | ORAL | Status: DC

## 2017-08-31 DIAGNOSIS — M7542 Impingement syndrome of left shoulder: Secondary | ICD-10-CM | POA: Diagnosis not present

## 2017-08-31 DIAGNOSIS — M6281 Muscle weakness (generalized): Secondary | ICD-10-CM | POA: Diagnosis not present

## 2017-08-31 DIAGNOSIS — M25512 Pain in left shoulder: Secondary | ICD-10-CM | POA: Diagnosis not present

## 2017-09-07 DIAGNOSIS — M7542 Impingement syndrome of left shoulder: Secondary | ICD-10-CM | POA: Diagnosis not present

## 2017-09-07 DIAGNOSIS — M6281 Muscle weakness (generalized): Secondary | ICD-10-CM | POA: Diagnosis not present

## 2017-09-07 DIAGNOSIS — M25512 Pain in left shoulder: Secondary | ICD-10-CM | POA: Diagnosis not present

## 2017-09-12 DIAGNOSIS — M7542 Impingement syndrome of left shoulder: Secondary | ICD-10-CM | POA: Diagnosis not present

## 2017-09-12 DIAGNOSIS — M25512 Pain in left shoulder: Secondary | ICD-10-CM | POA: Diagnosis not present

## 2017-09-12 DIAGNOSIS — M6281 Muscle weakness (generalized): Secondary | ICD-10-CM | POA: Diagnosis not present

## 2017-09-14 DIAGNOSIS — M7542 Impingement syndrome of left shoulder: Secondary | ICD-10-CM | POA: Diagnosis not present

## 2017-09-14 DIAGNOSIS — M25512 Pain in left shoulder: Secondary | ICD-10-CM | POA: Diagnosis not present

## 2017-09-14 DIAGNOSIS — M6281 Muscle weakness (generalized): Secondary | ICD-10-CM | POA: Diagnosis not present

## 2017-09-17 ENCOUNTER — Telehealth (INDEPENDENT_AMBULATORY_CARE_PROVIDER_SITE_OTHER): Payer: Self-pay | Admitting: Radiology

## 2017-09-17 DIAGNOSIS — M25512 Pain in left shoulder: Principal | ICD-10-CM

## 2017-09-17 DIAGNOSIS — G8929 Other chronic pain: Secondary | ICD-10-CM

## 2017-09-17 NOTE — Telephone Encounter (Signed)
Patient called and said that he is going to PT and the shoulder is no better.  They did dry needling last week and that was terribly painful.  What else can he do?  Does he need an MRI of the shoulder? He is just still hurting with no relief from PT.

## 2017-09-18 NOTE — Addendum Note (Signed)
Addended by: Brand Males E on: 09/18/2017 01:05 PM   Modules accepted: Orders

## 2017-09-18 NOTE — Telephone Encounter (Signed)
Get MRI to rule out rotator cuff tear

## 2017-09-21 ENCOUNTER — Other Ambulatory Visit: Payer: Self-pay | Admitting: *Deleted

## 2017-09-21 NOTE — Progress Notes (Signed)
Infusion orders are current for patient CBC CMP Tylenol Benadryl appointments are up to date and follow up appointment  is scheduled TB gold not due yet.  

## 2017-09-22 ENCOUNTER — Ambulatory Visit
Admission: RE | Admit: 2017-09-22 | Discharge: 2017-09-22 | Disposition: A | Discharge status: Home / Self Care | Payer: Medicare Other | Source: Ambulatory Visit | Attending: Physician Assistant | Admitting: Physician Assistant

## 2017-09-22 DIAGNOSIS — M75122 Complete rotator cuff tear or rupture of left shoulder, not specified as traumatic: Secondary | ICD-10-CM | POA: Diagnosis not present

## 2017-09-22 DIAGNOSIS — M25512 Pain in left shoulder: Principal | ICD-10-CM

## 2017-09-22 DIAGNOSIS — G8929 Other chronic pain: Secondary | ICD-10-CM

## 2017-09-24 ENCOUNTER — Encounter (INDEPENDENT_AMBULATORY_CARE_PROVIDER_SITE_OTHER): Payer: Self-pay | Admitting: Physician Assistant

## 2017-09-24 ENCOUNTER — Ambulatory Visit (INDEPENDENT_AMBULATORY_CARE_PROVIDER_SITE_OTHER): Payer: Medicare Other | Admitting: Physician Assistant

## 2017-09-24 DIAGNOSIS — M75102 Unspecified rotator cuff tear or rupture of left shoulder, not specified as traumatic: Secondary | ICD-10-CM | POA: Diagnosis not present

## 2017-09-24 DIAGNOSIS — I2510 Atherosclerotic heart disease of native coronary artery without angina pectoris: Secondary | ICD-10-CM

## 2017-09-24 NOTE — Progress Notes (Signed)
HPI: Joel Jones returns today to go to the MRI of his left shoulder.  Continues to have pain in the left shoulder that is time physical therapy.  Pain is mostly 80-110 degrees forward flexion.  States that since undergoing dry needling physical therapy for his left shoulder worse.    MRI MRI left shoulder dated 09/22/2017 showed moderate to advanced acromioclavicular joint arthritis.  Supraspinatus tendinopathy with 0.5 cm from front to back near full-thickness interstitial tear.  Glenohumeral joint with no acute findings no worrisome lesions lesions or fracture.  Physical exam: Strength with external and internal rotation against resistance both shoulders.  Positive impingement test on the left.  He is able to bring his arm above his head but this is done with a ratcheting motion.  Impression: Left shoulder rotator cuff tear: AC joint arthritic changes  Plan: At this point time a spoke with Joel Jones and reviewed the findings using a model to demonstrate his pathology.  Due to the fact he has continued pain we would recommend either continue with physical therapy or left shoulder arthroscopy with SAD, DCR and possible rotator cuff repair.  Discussed with him the postoperative protocol.  Also risk benefits including infection, prolonged pain ,worsening pain ,wound healing issues and possible unrepairable rotator cuff tear.  He would like to think about surgery and he will get back to [place] once he decides.

## 2017-09-25 DIAGNOSIS — Z794 Long term (current) use of insulin: Secondary | ICD-10-CM | POA: Diagnosis not present

## 2017-09-25 DIAGNOSIS — N182 Chronic kidney disease, stage 2 (mild): Secondary | ICD-10-CM | POA: Diagnosis not present

## 2017-09-25 DIAGNOSIS — I10 Essential (primary) hypertension: Secondary | ICD-10-CM | POA: Diagnosis not present

## 2017-09-25 DIAGNOSIS — E1122 Type 2 diabetes mellitus with diabetic chronic kidney disease: Secondary | ICD-10-CM | POA: Diagnosis not present

## 2017-09-26 ENCOUNTER — Other Ambulatory Visit (HOSPITAL_COMMUNITY): Payer: Self-pay | Admitting: *Deleted

## 2017-09-27 ENCOUNTER — Ambulatory Visit (HOSPITAL_COMMUNITY)
Admission: RE | Admit: 2017-09-27 | Discharge: 2017-09-27 | Disposition: A | Discharge status: Home / Self Care | Payer: Medicare Other | Source: Ambulatory Visit | Attending: Rheumatology | Admitting: Rheumatology

## 2017-09-27 DIAGNOSIS — M0579 Rheumatoid arthritis with rheumatoid factor of multiple sites without organ or systems involvement: Secondary | ICD-10-CM | POA: Diagnosis not present

## 2017-09-27 MED ORDER — ACETAMINOPHEN 325 MG PO TABS: 650.0000 mg | ORAL_TABLET | ORAL | Status: DC

## 2017-09-27 MED ORDER — SODIUM CHLORIDE 0.9 % IV SOLN
750.0000 mg | INTRAVENOUS | Status: DC
  Administered 2017-09-27: 750 mg via INTRAVENOUS
  Filled 2017-09-27: qty 30

## 2017-09-27 MED ORDER — DIPHENHYDRAMINE HCL 25 MG PO CAPS: 25.0000 mg | ORAL_CAPSULE | ORAL | Status: DC

## 2017-10-11 DIAGNOSIS — M7582 Other shoulder lesions, left shoulder: Secondary | ICD-10-CM | POA: Diagnosis not present

## 2017-10-11 DIAGNOSIS — M7552 Bursitis of left shoulder: Secondary | ICD-10-CM | POA: Diagnosis not present

## 2017-10-11 DIAGNOSIS — M7542 Impingement syndrome of left shoulder: Secondary | ICD-10-CM | POA: Diagnosis not present

## 2017-10-11 DIAGNOSIS — G8918 Other acute postprocedural pain: Secondary | ICD-10-CM | POA: Diagnosis not present

## 2017-10-15 DIAGNOSIS — L720 Epidermal cyst: Secondary | ICD-10-CM | POA: Diagnosis not present

## 2017-10-15 DIAGNOSIS — D225 Melanocytic nevi of trunk: Secondary | ICD-10-CM | POA: Diagnosis not present

## 2017-10-19 ENCOUNTER — Other Ambulatory Visit: Payer: Self-pay | Admitting: *Deleted

## 2017-10-19 DIAGNOSIS — M0579 Rheumatoid arthritis with rheumatoid factor of multiple sites without organ or systems involvement: Secondary | ICD-10-CM

## 2017-10-25 ENCOUNTER — Ambulatory Visit (INDEPENDENT_AMBULATORY_CARE_PROVIDER_SITE_OTHER): Payer: Medicare Other | Admitting: Physician Assistant

## 2017-10-25 ENCOUNTER — Ambulatory Visit (HOSPITAL_COMMUNITY)
Admission: RE | Admit: 2017-10-25 | Discharge: 2017-10-25 | Disposition: A | Discharge status: Home / Self Care | Payer: Medicare Other | Source: Ambulatory Visit | Attending: Rheumatology | Admitting: Rheumatology

## 2017-10-25 ENCOUNTER — Encounter (INDEPENDENT_AMBULATORY_CARE_PROVIDER_SITE_OTHER): Payer: Self-pay | Admitting: Physician Assistant

## 2017-10-25 DIAGNOSIS — M0579 Rheumatoid arthritis with rheumatoid factor of multiple sites without organ or systems involvement: Secondary | ICD-10-CM | POA: Diagnosis not present

## 2017-10-25 DIAGNOSIS — Z9889 Other specified postprocedural states: Secondary | ICD-10-CM

## 2017-10-25 LAB — COMPREHENSIVE METABOLIC PANEL
ALK PHOS: 80 U/L (ref 38–126)
ALT: 24 U/L (ref 17–63)
ANION GAP: 8 (ref 5–15)
AST: 28 U/L (ref 15–41)
Albumin: 3.7 g/dL (ref 3.5–5.0)
BILIRUBIN TOTAL: 0.8 mg/dL (ref 0.3–1.2)
BUN: 17 mg/dL (ref 6–20)
CALCIUM: 9 mg/dL (ref 8.9–10.3)
CO2: 28 mmol/L (ref 22–32)
Chloride: 100 mmol/L — ABNORMAL LOW (ref 101–111)
Creatinine, Ser: 1.27 mg/dL — ABNORMAL HIGH (ref 0.61–1.24)
GFR calc non Af Amer: 56 mL/min — ABNORMAL LOW (ref >60)
Glucose, Bld: 238 mg/dL — ABNORMAL HIGH (ref 65–99)
POTASSIUM: 3.8 mmol/L (ref 3.5–5.1)
SODIUM: 136 mmol/L (ref 135–145)
TOTAL PROTEIN: 6.7 g/dL (ref 6.5–8.1)

## 2017-10-25 LAB — CBC
HEMATOCRIT: 43.3 % (ref 39.0–52.0)
Hemoglobin: 14.6 g/dL (ref 13.0–17.0)
MCH: 29.6 pg (ref 26.0–34.0)
MCHC: 33.7 g/dL (ref 30.0–36.0)
MCV: 87.8 fL (ref 78.0–100.0)
Platelets: 323 10*3/uL (ref 150–400)
RBC: 4.93 MIL/uL (ref 4.22–5.81)
RDW: 12.5 % (ref 11.5–15.5)
WBC: 6.5 10*3/uL (ref 4.0–10.5)

## 2017-10-25 MED ORDER — ABATACEPT 250 MG IV SOLR
750.0000 mg | INTRAVENOUS | Status: DC
  Administered 2017-10-25: 750 mg via INTRAVENOUS
  Filled 2017-10-25: qty 30

## 2017-10-25 MED ORDER — DIPHENHYDRAMINE HCL 25 MG PO CAPS: 25.0000 mg | ORAL_CAPSULE | ORAL | Status: DC

## 2017-10-25 MED ORDER — ACETAMINOPHEN 325 MG PO TABS: 650.0000 mg | ORAL_TABLET | ORAL | Status: DC

## 2017-10-25 NOTE — Progress Notes (Signed)
stable °

## 2017-10-25 NOTE — Progress Notes (Signed)
HPI: Mr. Mongiello returns today follow-up left shoulder arthroscopy with extensive debridement and subacromial decompression with partial acromioplasty without coracoacromial release.  Patient overall is still having some pain in the shoulder.  He notes he has decreased range of motion.  He presents today in a sling.  Having difficulty raising arm over his head at this point.  He did use the arm by mistake to catch a trunk of a car by mistake earlier this week has had increased pain in his shoulder since then.  Physical exam left shoulder: Port sites are well approximated with nylon sutures.  No signs of gross infection.  He has good range of motion the elbow without pain.  Forward flexion of the shoulder to only approximately 130 degrees.  Cuff strength is 5 out of 5 bilaterally.  Impression: 12 days status post left shoulder arthroscopy with subacromial decompression and partial acromioplasty Plan: Have him come out of the sling only wearing it for comfort.  Encouraged range of motion the elbow.  We will send in physical therapy to work on range of motion strengthening of the left shoulder.  He follow-up with [place] in a month check his progress lack of.  Sutures were removed today he will work on scar tissue mobilization of the port sites.

## 2017-11-12 NOTE — Progress Notes (Signed)
Office Visit Note  Patient: Joel Gorr Bruna Sr.             Date of Birth: 04/10/1946           MRN: [id number]             PCP: Rennis Golden Referring: Orlena Sheldon, PA-C Visit Date: 11/23/2017 Occupation: '@GUAROCC' @    Subjective:  Left shoulder discomfort   History of Present Illness: Joel Kail Felkins Sr. is a 72 y.o. male with history of seropositive rheumatoid arthritis, osteoarthritis, and DDD.  Patient is currently getting Orenica IV infusions.  His last Orencia infusion was yesterday.  He discontinued Arava in February 2019 due to elevated LFTs.  He has not noticed any difference since discontinuing Arava.  Reports that he has some increased joint stiffness a couple days leading up to his next infusion.  Reports that his bilateral knee replacements are doing well.  Denies any knee joint swelling.  He reports that he has been taking tart cherry which he feels is helping with inflammation.  He reports that he had a left shoulder arthroscopic knee performed by Dr. Ninfa Linden in June 2019.  He states that he is unable to go to physical therapy but did therapy at home.  He reports he has good range of motion.  He states he has occasional discomfort but overall is doing really well.  He states he is following up with Dr. Colleen Can on Monday.  He denies any other joint pain or joint swelling at this time.  He continues take pilocarpine 5 mg 3 times daily as needed for sicca symptoms.  Activities of Daily Living:  Patient reports morning stiffness for 2  minutes.   Patient Reports nocturnal pain.  Difficulty dressing/grooming: Denies Difficulty climbing stairs: Denies Difficulty getting out of chair: Denies Difficulty using hands for taps, buttons, cutlery, and/or writing: Denies   Review of Systems  Constitutional: Positive for fatigue. Negative for night sweats.  HENT: Positive for mouth dryness (Takes Pilocarpine). Negative for mouth sores, trouble swallowing, trouble swallowing and nose  dryness.   Eyes: Positive for dryness. Negative for redness and visual disturbance.  Respiratory: Negative for cough, hemoptysis, shortness of breath and difficulty breathing.   Cardiovascular: Negative for chest pain, palpitations, hypertension, irregular heartbeat and swelling in legs/feet.  Gastrointestinal: Negative for blood in stool, constipation and diarrhea.  Endocrine: Negative for increased urination.  Genitourinary: Negative for painful urination.  Musculoskeletal: Positive for arthralgias, joint pain and morning stiffness. Negative for joint swelling, myalgias, muscle weakness, muscle tenderness and myalgias.  Skin: Negative for color change, rash, hair loss, nodules/bumps, skin tightness, ulcers and sensitivity to sunlight.  Allergic/Immunologic: Negative for susceptible to infections.  Neurological: Negative for dizziness, fainting, memory loss, night sweats and weakness.  Hematological: Negative for swollen glands.  Psychiatric/Behavioral: Positive for sleep disturbance. Negative for depressed mood. The patient is not nervous/anxious.     PMFS History:  Patient Active Problem List   Diagnosis Date Noted  . Tear of left rotator cuff 09/24/2017  . Acute pain of left shoulder 03/12/2017  . Rib pain 03/12/2017  . DDD (degenerative disc disease), cervical 10/31/2016  . Primary osteoarthritis of both hands 10/31/2016  . Primary osteoarthritis of both feet 10/31/2016  . High risk medication use 07/06/2016  . History of diabetes mellitus 07/06/2016  . History of coronary artery disease 07/06/2016  . Rheumatoid arthritis involving multiple sites with positive rheumatoid factor ([place]) 06/02/2016  . Other fatigue 06/02/2016  .  Contracture, elbow, left 06/02/2016  . Swelling of left hand 05/01/2016  . Osteoarthritis of right knee 08/27/2015  . S/P TKR (total knee replacement), left 08/27/2015  . Essential hypertension 05/11/2015  . Pain in the chest   . Right groin pain  08/15/2012  . Chest pain 04/02/2012  . Cerebrovascular disease 04/02/2012  . CAD (coronary artery disease) 04/02/2012  . Diabetes mellitus without complication ([place])   . Dizziness   . Sinus bradycardia   . Hyperlipidemia   . Edema     Past Medical History:  Diagnosis Date  . Anemia   . Arthritis    "all over"  . Bradycardia   . Chronic bronchitis ([place])    "get it just about q yr" (03/17/2014)  . Daily headache    "here lately" (03/17/2014); relates to sinuses  . GERD (gastroesophageal reflux disease)   . Hard of hearing    hearing aides bilat  . Hiatal hernia   . History of blood transfusion 2008   "related to OR"  . History of kidney stones   . Hyperlipidemia   . Hypertension   . Pneumonia 1972 X 1  . Type II diabetes mellitus ([place])   . Wears glasses     Family History  Problem Relation Age of Onset  . Cancer Father        prostate  . Heart disease Unknown        No family history   Past Surgical History:  Procedure Laterality Date  . broken finger      surgical repaired left hand 2nd finger   . cyst removed      per left knee/posteriorly  . CYSTECTOMY Left 2009   "behind knee"  . INGUINAL HERNIA REPAIR Right ?2010  . JOINT REPLACEMENT    . KNEE ARTHROSCOPY Bilateral 1980's  . PARTIAL KNEE ARTHROPLASTY Right 08/27/2015   Procedure: RIGHT UNICOMPARTMENTAL KNEE ARTHROPLASTY;  Surgeon: Mcarthur Rossetti, MD;  Location: [facility];  Service: Orthopedics;  Laterality: Right;  . REVISION TOTAL KNEE ARTHROPLASTY Left 2008  . SHOULDER SURGERY Left 2019  . TOTAL KNEE ARTHROPLASTY Left 2003   Social History   Social History Narrative   Patient lives at home with family.   Caffeine Use: occasionally     Objective: Vital Signs: BP 107/67 (BP Location: Left Arm, Patient Position: Sitting, Cuff Size: Normal)   Pulse (!) 56   Resp 15   Ht '5\' 6"'  (1.676 m)   Wt 197 lb (89.4 kg)   BMI 31.80 kg/m    Physical Exam  Constitutional: He is oriented to person, place,  and time. He appears well-developed and well-nourished.  HENT:  Head: Normocephalic and atraumatic.  Eyes: Pupils are equal, round, and reactive to light. Conjunctivae and EOM are normal.  Neck: Normal range of motion. Neck supple.  Cardiovascular: Normal rate, regular rhythm and normal heart sounds.  Pulmonary/Chest: Effort normal and breath sounds normal.  Abdominal: Soft. Bowel sounds are normal.  Lymphadenopathy:    He has no cervical adenopathy.  Neurological: He is alert and oriented to person, place, and time.  Skin: Skin is warm and dry. Capillary refill takes less than 2 seconds.  Psychiatric: He has a normal mood and affect. His behavior is normal.  Nursing note and vitals reviewed.    Musculoskeletal Exam: C-spine limited range of motion with lateral rotation.  Thoracic and lumbar spine good range of motion.  No midline spinal tenderness.  No SI joint tenderness.  Full range of  motion bilateral shoulder joints.  Elbow joints, wrist joints, MCPs, PIPs, DIPs good range of motion with no synovitis.  He has PIP and DIP synovial thickening consistent with osteoarthritis of bilateral hands.  His right partial and left total knee replacement are doing fairly well he has good range of motion with no discomfort.  He has mild warmth of the left knee joint.  Good range of motion of bilateral ankle joints with no discomfort.  No tenderness of trochanteric bursa bilaterally.  CDAI Exam: CDAI Homunculus Exam:   Joint Counts:  CDAI Tender Joint count: 0 CDAI Swollen Joint count: 0  Global Assessments:  Patient Global Assessment: 1 Provider Global Assessment: 1  CDAI Calculated Score: 2    Investigation: No additional findings.TB Gold: 06/07/2017 Negative  CBC Latest Ref Rng & Units 10/25/2017 08/30/2017 07/05/2017  WBC 4.0 - 10.5 K/uL 6.5 5.4 4.8  Hemoglobin 13.0 - 17.0 g/dL 14.6 13.6 14.7  Hematocrit 39.0 - 52.0 % 43.3 40.0 42.9  Platelets 150 - 400 K/uL 323 239 232   CMP Latest Ref  Rng & Units 10/25/2017 08/30/2017 07/05/2017  Glucose 65 - 99 mg/dL 238(H) 205(H) 230(H)  BUN 6 - 20 mg/dL '17 18 19  ' Creatinine 0.61 - 1.24 mg/dL 1.27(H) 1.21 1.24  Sodium 135 - 145 mmol/L 136 135 136  Potassium 3.5 - 5.1 mmol/L 3.8 4.1 3.9  Chloride 101 - 111 mmol/L 100(L) 100(L) 100(L)  CO2 22 - 32 mmol/L '28 26 25  ' Calcium 8.9 - 10.3 mg/dL 9.0 8.9 9.2  Total Protein 6.5 - 8.1 g/dL 6.7 6.7 6.8  Total Bilirubin 0.3 - 1.2 mg/dL 0.8 0.8 1.1  Alkaline Phos 38 - 126 U/L 80 76 89  AST 15 - 41 U/L 28 31 88(H)  ALT 17 - 63 U/L 24 28 106(H)    Imaging: No results found.  Speciality Comments: Orencia 741m every 4 weeks TB gold negative on Jan 2019    Procedures:  No procedures performed Allergies: Codeine; Invokana [canagliflozin]; Lipitor [atorvastatin]; Losartan; Vicodin [hydrocodone-acetaminophen]; and Roxicodone [oxycodone]   Assessment / Plan:     Visit Diagnoses: Rheumatoid arthritis involving multiple sites with positive rheumatoid factor (HCC) - +RF,+antiCCP, history of elevated ESR: He has no active synovitis.  He has synovial thickening in several MCP joints.  He has no joint pain or joint swelling at this time.  He has not had any recent rheumatoid arthritis flares.  His last infusion of Orencia was yesterday.  He has been tolerating the infusions well.  He discontinued on arrival in February 2019 due to elevated LFTs.  His lab work in April and June revealed normal LFTs.  He does not notice any difference since discontinuing Arava.  We will not add on low-dose Arava at this time since he is clinically doing well.  He will continue getting Orencia infusions.  He is advised to notify u[place]if he develops increased joint pain or joint swelling.  He will return the office in 5 months for follow-up visit.  High risk medication use - IVOrencia, Arava discontinue to due to elevated LFTs in February 2019 (inadequate response to Humira), pilocarpine for sicca symptoms.  Primary osteoarthritis of  both hands: He has PIP and DIP synovial thickening consistent with osteoarthritis of bilateral hands.  He has complete fist formation bilaterally.  He has no discomfort in his hands at this time.  He continues to have morning stiffness and hand stiffness a few days before his next Orencia infusion.  Primary osteoarthritis of  both feet: He has osteoarthritic changes in bilateral feet.  He wears proper fitting shoes.  He has no discomfort at this time.   S/P TKR (total knee replacement), left: He has warmth but no effusion.  He has chronic discomfort in his left knee.  He has good range of motion exam.  Status post right partial knee replacement: No discomfort at this time.  No warmth or effusion.  He has good range of motion.  DDD (degenerative disc disease), cervical: He has slightly limited range of motion of the C-spine.  No discomfort at this time.   Chronic insomnia: He continues to have chronic insomnia.  Other medical conditions are listed as follows:  Other fatigue  History of hyperlipidemia  History of hypertension  History of diabetes mellitus - He is on insulin pump.  History of coronary artery disease    Orders: No orders of the defined types were placed in this encounter.  No orders of the defined types were placed in this encounter.   Face-to-face time spent with patient was 30 minutes. Greater than 50% of time was spent in counseling and coordination of care.  Follow-Up Instructions: Return in about 5 months (around 04/25/2018) for Rheumatoid arthritis, Osteoarthritis, DDD.   Ofilia Neas, PA-C   I examined and evaluated the patient with Hazel Sams PA.  She does clinically doing much better on IV Orencia.  He had no synovitis on my examination today.  The plan of care was discussed as noted above.  Bo Merino, MD  Note - This record has been created using Editor, commissioning.  Chart creation errors have been sought, but [place] not always  have been located.  Such creation errors do not reflect on  the standard of medical care.

## 2017-11-22 ENCOUNTER — Ambulatory Visit (HOSPITAL_COMMUNITY)
Admission: RE | Admit: 2017-11-22 | Discharge: 2017-11-22 | Disposition: A | Discharge status: Home / Self Care | Payer: Medicare Other | Source: Ambulatory Visit | Attending: Rheumatology | Admitting: Rheumatology

## 2017-11-22 ENCOUNTER — Other Ambulatory Visit: Payer: Self-pay | Admitting: *Deleted

## 2017-11-22 DIAGNOSIS — M0579 Rheumatoid arthritis with rheumatoid factor of multiple sites without organ or systems involvement: Secondary | ICD-10-CM | POA: Diagnosis not present

## 2017-11-22 MED ORDER — ACETAMINOPHEN 325 MG PO TABS: 650.0000 mg | ORAL_TABLET | ORAL | Status: DC

## 2017-11-22 MED ORDER — SODIUM CHLORIDE 0.9 % IV SOLN
750.0000 mg | INTRAVENOUS | Status: DC
  Administered 2017-11-22: 750 mg via INTRAVENOUS
  Filled 2017-11-22: qty 30

## 2017-11-22 MED ORDER — DIPHENHYDRAMINE HCL 25 MG PO CAPS: 25.0000 mg | ORAL_CAPSULE | ORAL | Status: DC

## 2017-11-22 NOTE — Progress Notes (Signed)
Infusion orders are current for patient CBC CMP Tylenol Benadryl appointments are up to date and follow up appointment  is scheduled TB gold not due yet.  

## 2017-11-23 ENCOUNTER — Ambulatory Visit (INDEPENDENT_AMBULATORY_CARE_PROVIDER_SITE_OTHER): Payer: Medicare Other | Admitting: Rheumatology

## 2017-11-23 ENCOUNTER — Encounter: Payer: Self-pay | Admitting: Rheumatology

## 2017-11-23 VITALS — BP 107/67 | HR 56 | Resp 15 | Ht 66.0 in | Wt 197.0 lb

## 2017-11-23 DIAGNOSIS — M19041 Primary osteoarthritis, right hand: Secondary | ICD-10-CM

## 2017-11-23 DIAGNOSIS — Z8679 Personal history of other diseases of the circulatory system: Secondary | ICD-10-CM | POA: Diagnosis not present

## 2017-11-23 DIAGNOSIS — M19072 Primary osteoarthritis, left ankle and foot: Secondary | ICD-10-CM

## 2017-11-23 DIAGNOSIS — M5030 Other cervical disc degeneration, unspecified cervical region: Secondary | ICD-10-CM

## 2017-11-23 DIAGNOSIS — Z96651 Presence of right artificial knee joint: Secondary | ICD-10-CM | POA: Diagnosis not present

## 2017-11-23 DIAGNOSIS — R5383 Other fatigue: Secondary | ICD-10-CM | POA: Diagnosis not present

## 2017-11-23 DIAGNOSIS — M19042 Primary osteoarthritis, left hand: Secondary | ICD-10-CM

## 2017-11-23 DIAGNOSIS — M0579 Rheumatoid arthritis with rheumatoid factor of multiple sites without organ or systems involvement: Secondary | ICD-10-CM | POA: Diagnosis not present

## 2017-11-23 DIAGNOSIS — M19071 Primary osteoarthritis, right ankle and foot: Secondary | ICD-10-CM

## 2017-11-23 DIAGNOSIS — Z79899 Other long term (current) drug therapy: Secondary | ICD-10-CM

## 2017-11-23 DIAGNOSIS — F5104 Psychophysiologic insomnia: Secondary | ICD-10-CM

## 2017-11-23 DIAGNOSIS — I2510 Atherosclerotic heart disease of native coronary artery without angina pectoris: Secondary | ICD-10-CM

## 2017-11-23 DIAGNOSIS — Z8639 Personal history of other endocrine, nutritional and metabolic disease: Secondary | ICD-10-CM | POA: Diagnosis not present

## 2017-11-23 DIAGNOSIS — Z96652 Presence of left artificial knee joint: Secondary | ICD-10-CM | POA: Diagnosis not present

## 2017-11-23 NOTE — Patient Instructions (Signed)
Natural anti-inflammatories  You can purchase these at [employer], [employer] or online.  . Turmeric (capsules)  . Ginger (ginger root or capsules)  . Omega 3 (Fish, flax seeds, chia seeds, walnuts, almonds)  . Tart cherry (dried or extract)   Patient should be under the care of a physician while taking these supplements. This [place] not be reproduced without the permission of Dr. Shaili Deveshwar.  

## 2017-11-26 ENCOUNTER — Ambulatory Visit (INDEPENDENT_AMBULATORY_CARE_PROVIDER_SITE_OTHER): Payer: Medicare Other | Admitting: Physician Assistant

## 2017-11-26 ENCOUNTER — Encounter (INDEPENDENT_AMBULATORY_CARE_PROVIDER_SITE_OTHER): Payer: Self-pay | Admitting: Physician Assistant

## 2017-11-26 DIAGNOSIS — Z9889 Other specified postprocedural states: Secondary | ICD-10-CM

## 2017-11-26 NOTE — Progress Notes (Cosign Needed)
HPI: [name] returns today follow-up of his left shoulder.  He is now almost 6 weeks status post left shoulder arthroscopy.  He did not get to physical therapy due to family member who has some major health issues and is unable to leave alone.  However his left shoulder pain is decreasing.  He is doing home exercises including exercises with Thera-Band.  He also is taking tart cherries and IV Orencia.  Physical exam: Left shoulder 180 degrees of active full forward flexion.  He has 5 out of 5 strength with external and internal rotation against resistance.  Impingement testing is negative.  Has some discomfort with extreme internal rotation of the shoulder.  Impression: Status post left shoulder arthroscopy nearly 6 weeks postop.  Plan at this point time he will continue with his home exercise program.  He can always go to formal therapy if he needs to in the future he will just let us know.  See him back on an as-needed basis.

## 2017-12-03 ENCOUNTER — Other Ambulatory Visit: Payer: Self-pay | Admitting: *Deleted

## 2017-12-03 NOTE — Progress Notes (Signed)
Infusion orders are current for patient CBC CMP Tylenol Benadryl appointments are up to date and follow up appointment  is scheduled TB gold not due yet.  

## 2017-12-04 ENCOUNTER — Ambulatory Visit: Payer: Medicare Other | Admitting: Rheumatology

## 2017-12-17 ENCOUNTER — Ambulatory Visit: Payer: Medicare Other | Admitting: Physician Assistant

## 2017-12-20 ENCOUNTER — Ambulatory Visit (HOSPITAL_COMMUNITY)
Admission: RE | Admit: 2017-12-20 | Discharge: 2017-12-20 | Disposition: A | Discharge status: Home / Self Care | Payer: Medicare Other | Source: Ambulatory Visit | Attending: Rheumatology | Admitting: Rheumatology

## 2017-12-20 ENCOUNTER — Telehealth: Payer: Self-pay | Admitting: *Deleted

## 2017-12-20 DIAGNOSIS — M0579 Rheumatoid arthritis with rheumatoid factor of multiple sites without organ or systems involvement: Secondary | ICD-10-CM | POA: Diagnosis not present

## 2017-12-20 LAB — CBC
HCT: 43.5 % (ref 39.0–52.0)
Hemoglobin: 14.7 g/dL (ref 13.0–17.0)
MCH: 29.9 pg (ref 26.0–34.0)
MCHC: 33.8 g/dL (ref 30.0–36.0)
MCV: 88.6 fL (ref 78.0–100.0)
PLATELETS: 248 10*3/uL (ref 150–400)
RBC: 4.91 MIL/uL (ref 4.22–5.81)
RDW: 12.6 % (ref 11.5–15.5)
WBC: 6 10*3/uL (ref 4.0–10.5)

## 2017-12-20 LAB — COMPREHENSIVE METABOLIC PANEL
ALBUMIN: 3.9 g/dL (ref 3.5–5.0)
ALT: 26 U/L (ref 0–44)
ANION GAP: 10 (ref 5–15)
AST: 29 U/L (ref 15–41)
Alkaline Phosphatase: 83 U/L (ref 38–126)
BUN: 17 mg/dL (ref 8–23)
CHLORIDE: 100 mmol/L (ref 98–111)
CO2: 27 mmol/L (ref 22–32)
Calcium: 9.1 mg/dL (ref 8.9–10.3)
Creatinine, Ser: 1.3 mg/dL — ABNORMAL HIGH (ref 0.61–1.24)
GFR calc Af Amer: >60 mL/min (ref >60)
GFR calc non Af Amer: 54 mL/min — ABNORMAL LOW (ref >60)
GLUCOSE: 240 mg/dL — AB (ref 70–99)
POTASSIUM: 3.7 mmol/L (ref 3.5–5.1)
SODIUM: 137 mmol/L (ref 135–145)
Total Bilirubin: 1.2 mg/dL (ref 0.3–1.2)
Total Protein: 6.5 g/dL (ref 6.5–8.1)

## 2017-12-20 MED ORDER — SODIUM CHLORIDE 0.9 % IV SOLN
750.0000 mg | INTRAVENOUS | Status: DC
  Administered 2017-12-20: 750 mg via INTRAVENOUS
  Filled 2017-12-20: qty 30

## 2017-12-20 MED ORDER — ACETAMINOPHEN 325 MG PO TABS: 650.0000 mg | ORAL_TABLET | ORAL | Status: DC

## 2017-12-20 MED ORDER — DIPHENHYDRAMINE HCL 25 MG PO CAPS: 25.0000 mg | ORAL_CAPSULE | ORAL | Status: DC

## 2017-12-20 NOTE — Progress Notes (Signed)
Labs are stable.

## 2017-12-20 NOTE — Telephone Encounter (Signed)
Patient advised to use Tylenol on a PRN basis. Patient advised to avoid NSAIDs. Patient verbalized understanding.

## 2017-12-20 NOTE — Telephone Encounter (Signed)
Patient states he is having stiffness in both hands. Patient states the stiffness last all day. Patient would like to know what he can do for this. Patient states it effects his four fingers on his bilateral hands.

## 2017-12-20 NOTE — Telephone Encounter (Signed)
He [place] take Tylenol on PRN basis.  I would avoid anti-inflammatories because his creatinine is elevated.

## 2017-12-31 DIAGNOSIS — Z794 Long term (current) use of insulin: Secondary | ICD-10-CM | POA: Diagnosis not present

## 2017-12-31 DIAGNOSIS — N182 Chronic kidney disease, stage 2 (mild): Secondary | ICD-10-CM | POA: Diagnosis not present

## 2017-12-31 DIAGNOSIS — E1122 Type 2 diabetes mellitus with diabetic chronic kidney disease: Secondary | ICD-10-CM | POA: Diagnosis not present

## 2017-12-31 DIAGNOSIS — I10 Essential (primary) hypertension: Secondary | ICD-10-CM | POA: Diagnosis not present

## 2018-01-02 ENCOUNTER — Other Ambulatory Visit: Payer: Self-pay | Admitting: Physician Assistant

## 2018-01-08 ENCOUNTER — Other Ambulatory Visit: Payer: Self-pay | Admitting: *Deleted

## 2018-01-08 NOTE — Progress Notes (Signed)
Infusion orders are current for patient CBC CMP Tylenol Benadryl appointments are up to date and follow up appointment  is scheduled TB gold not due yet.  

## 2018-01-17 ENCOUNTER — Encounter (HOSPITAL_COMMUNITY): Admission: RE | Admit: 2018-01-17 | Payer: Medicare Other | Source: Ambulatory Visit

## 2018-01-23 ENCOUNTER — Encounter: Payer: Medicare Other | Admitting: Physician Assistant

## 2018-01-24 ENCOUNTER — Encounter (HOSPITAL_COMMUNITY): Payer: Self-pay

## 2018-01-24 ENCOUNTER — Ambulatory Visit (HOSPITAL_COMMUNITY)
Admission: RE | Admit: 2018-01-24 | Discharge: 2018-01-24 | Disposition: A | Discharge status: Home / Self Care | Payer: Medicare Other | Source: Ambulatory Visit | Attending: Rheumatology | Admitting: Rheumatology

## 2018-01-24 DIAGNOSIS — M0579 Rheumatoid arthritis with rheumatoid factor of multiple sites without organ or systems involvement: Secondary | ICD-10-CM | POA: Insufficient documentation

## 2018-01-24 MED ORDER — DIPHENHYDRAMINE HCL 25 MG PO CAPS: 25.0000 mg | ORAL_CAPSULE | ORAL | Status: DC

## 2018-01-24 MED ORDER — SODIUM CHLORIDE 0.9 % IV SOLN
750.0000 mg | INTRAVENOUS | Status: DC
  Administered 2018-01-24: 750 mg via INTRAVENOUS
  Filled 2018-01-24: qty 30

## 2018-01-24 MED ORDER — ACETAMINOPHEN 325 MG PO TABS: 650.0000 mg | ORAL_TABLET | ORAL | Status: DC

## 2018-01-25 ENCOUNTER — Other Ambulatory Visit: Payer: Self-pay | Admitting: *Deleted

## 2018-01-25 NOTE — Progress Notes (Signed)
Infusion orders are current for patient CBC CMP Tylenol Benadryl appointments are up to date and follow up appointment  is scheduled TB gold not due yet.  

## 2018-02-11 ENCOUNTER — Other Ambulatory Visit: Payer: Self-pay | Admitting: Physician Assistant

## 2018-02-21 ENCOUNTER — Ambulatory Visit (HOSPITAL_COMMUNITY)
Admission: RE | Admit: 2018-02-21 | Discharge: 2018-02-21 | Disposition: A | Discharge status: Home / Self Care | Payer: Medicare Other | Source: Ambulatory Visit | Attending: Rheumatology | Admitting: Rheumatology

## 2018-02-21 DIAGNOSIS — M0579 Rheumatoid arthritis with rheumatoid factor of multiple sites without organ or systems involvement: Secondary | ICD-10-CM | POA: Insufficient documentation

## 2018-02-21 LAB — CBC
HEMATOCRIT: 42.2 % (ref 39.0–52.0)
HEMOGLOBIN: 13.7 g/dL (ref 13.0–17.0)
MCH: 29.6 pg (ref 26.0–34.0)
MCHC: 32.5 g/dL (ref 30.0–36.0)
MCV: 91.1 fL (ref 80.0–100.0)
NRBC: 0 % (ref 0.0–0.2)
Platelets: 326 10*3/uL (ref 150–400)
RBC: 4.63 MIL/uL (ref 4.22–5.81)
RDW: 12.9 % (ref 11.5–15.5)
WBC: 5 10*3/uL (ref 4.0–10.5)

## 2018-02-21 LAB — COMPREHENSIVE METABOLIC PANEL
ALBUMIN: 3.9 g/dL (ref 3.5–5.0)
ALT: 28 U/L (ref 0–44)
AST: 29 U/L (ref 15–41)
Alkaline Phosphatase: 88 U/L (ref 38–126)
Anion gap: 9 (ref 5–15)
BILIRUBIN TOTAL: 0.8 mg/dL (ref 0.3–1.2)
BUN: 16 mg/dL (ref 8–23)
CO2: 27 mmol/L (ref 22–32)
Calcium: 9.2 mg/dL (ref 8.9–10.3)
Chloride: 102 mmol/L (ref 98–111)
Creatinine, Ser: 1.29 mg/dL — ABNORMAL HIGH (ref 0.61–1.24)
GFR calc Af Amer: >60 mL/min (ref >60)
GFR calc non Af Amer: 54 mL/min — ABNORMAL LOW (ref >60)
GLUCOSE: 217 mg/dL — AB (ref 70–99)
Potassium: 3.7 mmol/L (ref 3.5–5.1)
Sodium: 138 mmol/L (ref 135–145)
TOTAL PROTEIN: 6 g/dL — AB (ref 6.5–8.1)

## 2018-02-21 MED ORDER — ACETAMINOPHEN 325 MG PO TABS: 650.0000 mg | ORAL_TABLET | ORAL | Status: DC

## 2018-02-21 MED ORDER — DIPHENHYDRAMINE HCL 25 MG PO CAPS
ORAL_CAPSULE | ORAL | Status: AC
  Filled 2018-02-21: qty 1

## 2018-02-21 MED ORDER — DIPHENHYDRAMINE HCL 25 MG PO CAPS: 25.0000 mg | ORAL_CAPSULE | ORAL | Status: DC

## 2018-02-21 MED ORDER — SODIUM CHLORIDE 0.9 % IV SOLN
750.0000 mg | INTRAVENOUS | Status: DC
  Administered 2018-02-21: 750 mg via INTRAVENOUS
  Filled 2018-02-21: qty 30

## 2018-02-21 MED ORDER — ACETAMINOPHEN 325 MG PO TABS
ORAL_TABLET | ORAL | Status: AC
  Filled 2018-02-21: qty 2

## 2018-03-04 ENCOUNTER — Other Ambulatory Visit: Payer: Self-pay | Admitting: *Deleted

## 2018-03-04 DIAGNOSIS — M0579 Rheumatoid arthritis with rheumatoid factor of multiple sites without organ or systems involvement: Secondary | ICD-10-CM

## 2018-03-06 ENCOUNTER — Ambulatory Visit (INDEPENDENT_AMBULATORY_CARE_PROVIDER_SITE_OTHER): Payer: Medicare Other | Admitting: Physician Assistant

## 2018-03-06 ENCOUNTER — Ambulatory Visit (INDEPENDENT_AMBULATORY_CARE_PROVIDER_SITE_OTHER): Payer: Medicare Other

## 2018-03-06 ENCOUNTER — Encounter (INDEPENDENT_AMBULATORY_CARE_PROVIDER_SITE_OTHER): Payer: Self-pay | Admitting: Physician Assistant

## 2018-03-06 VITALS — Ht 66.0 in | Wt 195.0 lb

## 2018-03-06 DIAGNOSIS — M25552 Pain in left hip: Secondary | ICD-10-CM

## 2018-03-06 DIAGNOSIS — M7712 Lateral epicondylitis, left elbow: Secondary | ICD-10-CM

## 2018-03-06 DIAGNOSIS — M25522 Pain in left elbow: Secondary | ICD-10-CM | POA: Diagnosis not present

## 2018-03-06 DIAGNOSIS — I2510 Atherosclerotic heart disease of native coronary artery without angina pectoris: Secondary | ICD-10-CM

## 2018-03-06 DIAGNOSIS — M545 Low back pain, unspecified: Secondary | ICD-10-CM

## 2018-03-06 MED ORDER — METHOCARBAMOL 500 MG PO TABS: 500.0000 mg | ORAL_TABLET | Freq: Every day | ORAL | 1 refills | Status: DC

## 2018-03-06 MED ORDER — DICLOFENAC SODIUM 1 % TD GEL: 2.0000 g | Freq: Four times a day (QID) | TRANSDERMAL | 1 refills | Status: DC

## 2018-03-06 NOTE — Progress Notes (Signed)
Office Visit Note   Patient: Joel Liberto Kaeser Sr.           Date of Birth: Mar 20, 1946           MRN: [id number] Visit Date: 03/06/2018              Requested by: Orlena Sheldon, PA-C [id number] [place], [place] [address] PCP: Orlena Sheldon, PA-C   Assessment & Plan: Visit Diagnoses:  1. Acute left-sided low back pain, unspecified whether sciatica present   2. Pain in left hip   3. Pain in left elbow   4. Lateral epicondylitis, left elbow     Plan: Discussed with him lifting techniques for the lateral epicondylitis.  He will apply Voltaren gel 2 g 4 times daily to the lateral epicondyle region into the olecranon region.  Sending physical therapy both for the lateral epicondylitis and for his low back there to work on range of motion strengthening home exercise program modalities.  We will see him back in 1 month check his progress lack of.  Follow-Up Instructions: Return in about 4 weeks (around 04/03/2018).   Orders:  Orders Placed This Encounter  Procedures  . XR Lumbar Spine 2-3 Views  . XR HIP UNILAT W OR W/O PELVIS 2-3 VIEWS LEFT  . XR Elbow 2 Views Left   Meds ordered this encounter  Medications  . methocarbamol (ROBAXIN) 500 MG tablet    Sig: Take 1 tablet (500 mg total) by mouth at bedtime.    Dispense:  40 tablet    Refill:  1  . diclofenac sodium (VOLTAREN) 1 % GEL    Sig: Apply 2 g topically 4 (four) times daily. To left elbow    Dispense:  1 Tube    Refill:  1      Procedures: No procedures performed   Clinical Data: No additional findings.   Subjective: Chief Complaint  Patient presents with  . Lower Back - Pain  . Left Hip - Pain  . Left Elbow - Pain    HPI Joel Jones is well-known to Dr. Ninfa Linden service comes in today due to low back pain and hip pain for a couple of weeks.  He reports that he was lifting on his wife's sister who just recently passed away has had low back pain since that time.  Pain is worse with standing.  Denies any  numbness tingling down either leg no bowel bladder dysfunction no dysuria.  No waking pain.  When lying down if he bends sneezes he takes pressure off the low back helps some.  Regards to his elbow he has had pain in the elbow for some time but is unsure exactly how long hurts whenever he was lifting with his arm or lays down and puts any pressure on the elbow.  Has pain with moving the elbow and injury. Review of Systems See HPI  Objective: Vital Signs: Ht 5\' 6"  (1.676 m)   Wt 195 lb (88.5 kg)   BMI 31.47 kg/m   Physical Exam  Constitutional: He is oriented to person, place, and time. He appears well-developed and well-nourished. No distress.  Pulmonary/Chest: Effort normal.  Neurological: He is alert and oriented to person, place, and time.  Skin: He is not diaphoretic.  Psychiatric: He has a normal mood and affect.    Ortho Exam Lumbar spine is full range of motion of the lumbar spine without significant pain.  Has tenderness over left lower paraspinous region  or.  5 out of 5 strength throughout lower extremities against resistance.  MRI of the lumbar negative straight leg raise bilaterally.  Good range of motion bilateral hips without pain.  Nontender over the trochanteric region of both hips.  Left elbow he has tenderness over the lateral epicondyle region.  Resisted pronation causes pain lateral elbow.  Tenderness over his olecranon area where there is bony edge.  It is prominent.  No evidence of any olecranon bursitis. Specialty Comments:  No specialty comments available.  Imaging: Xr Elbow 2 Views Left  Result Date: 03/06/2018 Left elbow: No acute fracture. Elbow well located .  Osteophyte off the olecranon process.   Xr Hip Unilat W Or W/o Pelvis 2-3 Views Left  Result Date: 03/06/2018 Pelvis and left hip: Bilateral hips well located. No acute fractures. Hip joints well persevred.   Xr Lumbar Spine 2-3 Views  Result Date: 03/06/2018 Lumbar spine: no acute fractures.   Multilevel degenerative changes.  Retrolisthesis at L3-4 facet changes at L3-4.    PMFS History: Patient Active Problem List   Diagnosis Date Noted  . Tear of left rotator cuff 09/24/2017  . Acute pain of left shoulder 03/12/2017  . Rib pain 03/12/2017  . DDD (degenerative disc disease), cervical 10/31/2016  . Primary osteoarthritis of both hands 10/31/2016  . Primary osteoarthritis of both feet 10/31/2016  . High risk medication use 07/06/2016  . History of diabetes mellitus 07/06/2016  . History of coronary artery disease 07/06/2016  . Rheumatoid arthritis involving multiple sites with positive rheumatoid factor ([place]) 06/02/2016  . Other fatigue 06/02/2016  . Contracture, elbow, left 06/02/2016  . Swelling of left hand 05/01/2016  . Osteoarthritis of right knee 08/27/2015  . S/P TKR (total knee replacement), left 08/27/2015  . Essential hypertension 05/11/2015  . Pain in the chest   . Right groin pain 08/15/2012  . Chest pain 04/02/2012  . Cerebrovascular disease 04/02/2012  . CAD (coronary artery disease) 04/02/2012  . Diabetes mellitus without complication ([place])   . Dizziness   . Sinus bradycardia   . Hyperlipidemia   . Edema    Past Medical History:  Diagnosis Date  . Anemia   . Arthritis    "all over"  . Bradycardia   . Chronic bronchitis ([place])    "get it just about q yr" (03/17/2014)  . Daily headache    "here lately" (03/17/2014); relates to sinuses  . GERD (gastroesophageal reflux disease)   . Hard of hearing    hearing aides bilat  . Hiatal hernia   . History of blood transfusion 2008   "related to OR"  . History of kidney stones   . Hyperlipidemia   . Hypertension   . Pneumonia 1972 X 1  . Type II diabetes mellitus ([place])   . Wears glasses     Family History  Problem Relation Age of Onset  . Cancer Father        prostate  . Heart disease Unknown        No family history    Past Surgical History:  Procedure Laterality Date  . broken finger        surgical repaired left hand 2nd finger   . cyst removed      per left knee/posteriorly  . CYSTECTOMY Left 2009   "behind knee"  . INGUINAL HERNIA REPAIR Right ?2010  . JOINT REPLACEMENT    . KNEE ARTHROSCOPY Bilateral 1980's  . PARTIAL KNEE ARTHROPLASTY Right 08/27/2015   Procedure: RIGHT UNICOMPARTMENTAL  KNEE ARTHROPLASTY;  Surgeon: Mcarthur Rossetti, MD;  Location: [facility];  Service: Orthopedics;  Laterality: Right;  . REVISION TOTAL KNEE ARTHROPLASTY Left 2008  . SHOULDER SURGERY Left 2019  . TOTAL KNEE ARTHROPLASTY Left 2003   Social History   Occupational History    Employer: UNEMPLOYED  Tobacco Use  . Smoking status: Former Smoker    Packs/day: 1.00    Years: 5.00    Pack years: 5.00    Types: Cigarettes, Cigars    Last attempt to quit: 05/15/1968    Years since quitting: 49.8  . Smokeless tobacco: Never Used  Substance and Sexual Activity  . Alcohol use: No    Alcohol/week: 0.0 standard drinks  . Drug use: Never  . Sexual activity: Yes

## 2018-03-19 DIAGNOSIS — I10 Essential (primary) hypertension: Secondary | ICD-10-CM | POA: Diagnosis not present

## 2018-03-19 DIAGNOSIS — N182 Chronic kidney disease, stage 2 (mild): Secondary | ICD-10-CM | POA: Diagnosis not present

## 2018-03-19 DIAGNOSIS — Z794 Long term (current) use of insulin: Secondary | ICD-10-CM | POA: Diagnosis not present

## 2018-03-19 DIAGNOSIS — E1122 Type 2 diabetes mellitus with diabetic chronic kidney disease: Secondary | ICD-10-CM | POA: Diagnosis not present

## 2018-03-21 ENCOUNTER — Ambulatory Visit (HOSPITAL_COMMUNITY)
Admission: RE | Admit: 2018-03-21 | Discharge: 2018-03-21 | Disposition: A | Discharge status: Home / Self Care | Payer: Medicare Other | Source: Ambulatory Visit | Attending: Rheumatology | Admitting: Rheumatology

## 2018-03-21 DIAGNOSIS — M0579 Rheumatoid arthritis with rheumatoid factor of multiple sites without organ or systems involvement: Secondary | ICD-10-CM | POA: Diagnosis not present

## 2018-03-21 MED ORDER — SODIUM CHLORIDE 0.9 % IV SOLN
750.0000 mg | INTRAVENOUS | Status: DC
  Administered 2018-03-21: 750 mg via INTRAVENOUS
  Filled 2018-03-21: qty 30

## 2018-03-21 MED ORDER — DIPHENHYDRAMINE HCL 25 MG PO CAPS: 25.0000 mg | ORAL_CAPSULE | ORAL | Status: DC

## 2018-03-21 MED ORDER — ACETAMINOPHEN 325 MG PO TABS: 650.0000 mg | ORAL_TABLET | ORAL | Status: DC

## 2018-03-29 DIAGNOSIS — N182 Chronic kidney disease, stage 2 (mild): Secondary | ICD-10-CM | POA: Diagnosis not present

## 2018-03-29 DIAGNOSIS — K219 Gastro-esophageal reflux disease without esophagitis: Secondary | ICD-10-CM | POA: Diagnosis not present

## 2018-03-29 DIAGNOSIS — Z7689 Persons encountering health services in other specified circumstances: Secondary | ICD-10-CM | POA: Diagnosis not present

## 2018-03-29 DIAGNOSIS — E018 Other iodine-deficiency related thyroid disorders and allied conditions: Secondary | ICD-10-CM | POA: Diagnosis not present

## 2018-03-29 DIAGNOSIS — I10 Essential (primary) hypertension: Secondary | ICD-10-CM | POA: Diagnosis not present

## 2018-03-29 DIAGNOSIS — Z23 Encounter for immunization: Secondary | ICD-10-CM | POA: Diagnosis not present

## 2018-03-29 DIAGNOSIS — Z125 Encounter for screening for malignant neoplasm of prostate: Secondary | ICD-10-CM | POA: Diagnosis not present

## 2018-03-29 DIAGNOSIS — E1122 Type 2 diabetes mellitus with diabetic chronic kidney disease: Secondary | ICD-10-CM | POA: Diagnosis not present

## 2018-03-29 DIAGNOSIS — Z794 Long term (current) use of insulin: Secondary | ICD-10-CM | POA: Diagnosis not present

## 2018-03-29 DIAGNOSIS — E7849 Other hyperlipidemia: Secondary | ICD-10-CM | POA: Diagnosis not present

## 2018-04-03 ENCOUNTER — Ambulatory Visit (INDEPENDENT_AMBULATORY_CARE_PROVIDER_SITE_OTHER): Payer: Medicare Other | Admitting: Physician Assistant

## 2018-04-04 ENCOUNTER — Other Ambulatory Visit: Payer: Self-pay | Admitting: *Deleted

## 2018-04-04 NOTE — Progress Notes (Signed)
Infusion orders are current for patient CBC CMP Tylenol Benadryl appointments are up to date and follow up appointment  is scheduled TB gold not due yet.  

## 2018-04-15 NOTE — Progress Notes (Signed)
Office Visit Note  Patient: Joel Troung Gencarelli Sr.             Date of Birth: 04/20/1946           MRN: [id number]             PCP: Rennis Golden Referring: Rennis Golden Visit Date: 04/25/2018 Occupation: _0 @  Subjective:  Pain in both hands and left shoulder.   History of Present Illness: Joel Gantt Talley Sr. is a 72 y.o. male with history of rheumatoid arthritis and osteoarthritis.  He states he has been having pain and discomfort in his both hands.  He also has discomfort in his left shoulder.  He had surgery on his left shoulder in June for an injury.  He has not noticed any joint swelling.  His right knee joint partial replacement will have to be replaced again as he has been having increased pain.  Left total knee replacement is doing well.  He does not have much discomfort in cervical spine.  Activities of Daily Living:  Patient reports morning stiffness for 5 minute.   Patient Reports nocturnal pain.  Right knee Difficulty dressing/grooming: Denies Difficulty climbing stairs: Denies Difficulty getting out of chair: Denies Difficulty using hands for taps, buttons, cutlery, and/or writing: Denies  Review of Systems  Constitutional: Positive for fatigue. Negative for night sweats.  HENT: Negative for mouth sores, mouth dryness and nose dryness.   Eyes: Negative for redness and dryness.  Respiratory: Negative for shortness of breath and difficulty breathing.   Cardiovascular: Negative for chest pain, palpitations, hypertension, irregular heartbeat and swelling in legs/feet.  Gastrointestinal: Negative for blood in stool, constipation and diarrhea.  Endocrine: Negative for increased urination.  Musculoskeletal: Positive for arthralgias, joint pain and morning stiffness. Negative for joint swelling, myalgias, muscle weakness, muscle tenderness and myalgias.  Skin: Negative for color change, rash, hair loss, nodules/bumps, skin tightness, ulcers and sensitivity to  sunlight.  Allergic/Immunologic: Negative for susceptible to infections.  Neurological: Negative for dizziness, fainting, memory loss, night sweats and weakness ( ).  Hematological: Negative for swollen glands.  Psychiatric/Behavioral: Positive for sleep disturbance. Negative for depressed mood. The patient is not nervous/anxious.     PMFS History:  Patient Active Problem List   Diagnosis Date Noted  . Tear of left rotator cuff 09/24/2017  . Acute pain of left shoulder 03/12/2017  . Rib pain 03/12/2017  . DDD (degenerative disc disease), cervical 10/31/2016  . Primary osteoarthritis of both hands 10/31/2016  . Primary osteoarthritis of both feet 10/31/2016  . High risk medication use 07/06/2016  . History of diabetes mellitus 07/06/2016  . History of coronary artery disease 07/06/2016  . Rheumatoid arthritis involving multiple sites with positive rheumatoid factor ([place]) 06/02/2016  . Other fatigue 06/02/2016  . Contracture, elbow, left 06/02/2016  . Swelling of left hand 05/01/2016  . Osteoarthritis of right knee 08/27/2015  . S/P TKR (total knee replacement), left 08/27/2015  . Essential hypertension 05/11/2015  . Pain in the chest   . Right groin pain 08/15/2012  . Chest pain 04/02/2012  . Cerebrovascular disease 04/02/2012  . CAD (coronary artery disease) 04/02/2012  . Diabetes mellitus without complication ([place])   . Dizziness   . Sinus bradycardia   . Hyperlipidemia   . Edema     Past Medical History:  Diagnosis Date  . Anemia   . Arthritis    "all over"  . Bradycardia   . Chronic bronchitis ([place])    "  get it just about q yr" (03/17/2014)  . Daily headache    "here lately" (03/17/2014); relates to sinuses  . GERD (gastroesophageal reflux disease)   . Hard of hearing    hearing aides bilat  . Hiatal hernia   . History of blood transfusion 2008   "related to OR"  . History of kidney stones   . Hyperlipidemia   . Hypertension   . Pneumonia 1972 X 1  . Type II  diabetes mellitus ([place])   . Wears glasses     Family History  Problem Relation Age of Onset  . Cancer Father        prostate  . Heart disease Other        No family history   Past Surgical History:  Procedure Laterality Date  . broken finger      surgical repaired left hand 2nd finger   . cyst removed      per left knee/posteriorly  . CYSTECTOMY Left 2009   "behind knee"  . INGUINAL HERNIA REPAIR Right ?2010  . JOINT REPLACEMENT    . KNEE ARTHROSCOPY Bilateral 1980's  . PARTIAL KNEE ARTHROPLASTY Right 08/27/2015   Procedure: RIGHT UNICOMPARTMENTAL KNEE ARTHROPLASTY;  Surgeon: Mcarthur Rossetti, MD;  Location: [facility];  Service: Orthopedics;  Laterality: Right;  . REVISION TOTAL KNEE ARTHROPLASTY Left 2008  . SHOULDER SURGERY Left 2019  . TOTAL KNEE ARTHROPLASTY Left 2003   Social History   Social History Narrative   Patient lives at home with family.   Caffeine Use: occasionally   Immunization History  Administered Date(s) Administered  . Pneumococcal Conjugate-13 01/13/2014    Objective: Vital Signs: BP 120/73 (BP Location: Left Arm, Patient Position: Sitting, Cuff Size: Normal)   Pulse (!) 47   Resp 14   Ht _0  (1.676 m)   Wt 203 lb (92.1 kg)   BMI 32.77 kg/m    Physical Exam Vitals signs and nursing note reviewed.  Constitutional:      Appearance: He is well-developed.  HENT:     Head: Normocephalic and atraumatic.  Eyes:     Conjunctiva/sclera: Conjunctivae normal.     Pupils: Pupils are equal, round, and reactive to light.  Neck:     Musculoskeletal: Normal range of motion and neck supple.  Cardiovascular:     Rate and Rhythm: Normal rate and regular rhythm.     Heart sounds: Normal heart sounds.  Pulmonary:     Effort: Pulmonary effort is normal.     Breath sounds: Normal breath sounds.  Abdominal:     General: Bowel sounds are normal.     Palpations: Abdomen is soft.  Skin:    General: Skin is warm and dry.     Capillary Refill:  Capillary refill takes less than 2 seconds.  Neurological:     Mental Status: He is alert and oriented to person, place, and time.  Psychiatric:        Behavior: Behavior normal.      Musculoskeletal Exam: Cervical spine thoracic lumbar spine good range of motion.  He had discomfort range of motion of his left shoulder joint.  Elbow joints, wrist joints, MCPs were in good range of motion with no synovitis.  He has DIP and PIP thickening bilaterally consistent with osteoarthritis.  Hip joints were in good range of motion.  His right partial knee replacement has some warmth and crepitus.  Left total knee replacement is doing well.  Ankle joints MTPs PIPs been good range  of motion with no synovitis.  CDAI Exam: CDAI Score: 0.6  Patient Global Assessment: 4 (mm); Provider Global Assessment: 2 (mm) Swollen: 0 ; Tender: 0  Joint Exam   Not documented   There is currently no information documented on the homunculus. Go to the Rheumatology activity and complete the homunculus joint exam.  Investigation: No additional findings.  Imaging: No results found.  Recent Labs: Lab Results  Component Value Date   WBC 5.0 02/21/2018   HGB 13.7 02/21/2018   PLT 326 02/21/2018   NA 138 02/21/2018   K 3.7 02/21/2018   CL 102 02/21/2018   CO2 27 02/21/2018   GLUCOSE 217 (H) 02/21/2018   BUN 16 02/21/2018   CREATININE 1.29 (H) 02/21/2018   BILITOT 0.8 02/21/2018   ALKPHOS 88 02/21/2018   AST 29 02/21/2018   ALT 28 02/21/2018   PROT 6.0 (L) 02/21/2018   ALBUMIN 3.9 02/21/2018   CALCIUM 9.2 02/21/2018   GFRAA >60 02/21/2018   QFTBGOLDPLUS Negative 06/07/2017    Speciality Comments: Orencia 757m every 4 weeks TB gold negative on Jan 2019  Procedures:  No procedures performed Allergies: Codeine; Invokana [canagliflozin]; Lipitor [atorvastatin]; Losartan; Vicodin [hydrocodone-acetaminophen]; and Roxicodone [oxycodone]   Assessment / Plan:     Visit Diagnoses: Rheumatoid arthritis  involving multiple sites with positive rheumatoid factor (HCC) - +RF,+antiCCP, history of elevated ESR.  Patient has no synovitis on examination today.  He continues to have some arthralgias which I believe is due to underlying osteoarthritis.  High risk medication use -Current regimen includes Orencia IV every 4 weeks.  Last infusion on 04/18/2018.  Last TB gold negative on 06/07/2017.  Most recent CBC/CMP stable on 02/21/2018.   IVOrencia, Arava discontinue to due to elevated LFTs in February 2019 (inadequate response to Humira), pilocarpine for sicca symptoms.  We will check TB Gold with his next labs in January.  Chronic left shoulder pain-patient had injury in the past and is having discomfort in his left shoulder.  I offered physical therapy which she declined.  Have given him a handout on shoulder joint exercises.  I would avoid cortisone injection as he is diabetic.  Primary osteoarthritis of both hands-he has chronic pain discomfort in his bilateral hands due to osteoarthritis.  Primary osteoarthritis of both feet-he is not having much discomfort currently.  S/P TKR (total knee replacement), left-doing well  Status post right partial knee replacement-he has been having discomfort in his right knee.  And is planning to have right total knee replacement in future.  DDD (degenerative disc disease), cervical-some stiffness with good range of motion.  Other fatigue-persist.  Chronic insomnia-he has chronic insomnia and has not had any relief with medications.  Other medical problems are listed as follows :  History of coronary artery disease  History of hypertension  History of hyperlipidemia  History of diabetes mellitus - He is on insulin pump.   Orders: No orders of the defined types were placed in this encounter.  No orders of the defined types were placed in this encounter.   Follow-Up Instructions: Return in about 5 months (around 09/24/2018) for Rheumatoid  arthritis.   SBo Merino MD  Note - This record has been created using DEditor, commissioning  Chart creation errors have been sought, but [place] not always  have been located. Such creation errors do not reflect on  the standard of medical care.

## 2018-04-18 ENCOUNTER — Ambulatory Visit (HOSPITAL_COMMUNITY)
Admission: RE | Admit: 2018-04-18 | Discharge: 2018-04-18 | Disposition: A | Discharge status: Home / Self Care | Payer: Medicare Other | Source: Ambulatory Visit | Attending: Rheumatology | Admitting: Rheumatology

## 2018-04-18 DIAGNOSIS — M0579 Rheumatoid arthritis with rheumatoid factor of multiple sites without organ or systems involvement: Secondary | ICD-10-CM | POA: Diagnosis not present

## 2018-04-18 MED ORDER — ACETAMINOPHEN 325 MG PO TABS: 650.0000 mg | ORAL_TABLET | ORAL | Status: DC

## 2018-04-18 MED ORDER — SODIUM CHLORIDE 0.9 % IV SOLN
750.0000 mg | INTRAVENOUS | Status: DC
  Administered 2018-04-18: 750 mg via INTRAVENOUS
  Filled 2018-04-18: qty 30

## 2018-04-18 MED ORDER — DIPHENHYDRAMINE HCL 25 MG PO CAPS: 25.0000 mg | ORAL_CAPSULE | ORAL | Status: DC

## 2018-04-18 NOTE — Progress Notes (Signed)
Dr Estanislado Pandy notified client received pneumonia vaccine Nov 15,2019 and per Dr Estanislado Pandy ok to give [facility] Jones today

## 2018-04-24 ENCOUNTER — Telehealth: Payer: Self-pay | Admitting: Pharmacy Technician

## 2018-04-24 NOTE — Telephone Encounter (Signed)
Called the patient to verify benefits for 2020. Pt currently received infusions that [place] require a pre-certification. Patient states that his insurance plans will not change for 2020. Medicare covers 80% of the infusion and no authorization is required, and the supplement would cover the 20% of the cost that was not paid for by Medicare as long as Medicare covered the medication.    9:57 AM Beatriz Chancellor, CPhT

## 2018-04-25 ENCOUNTER — Ambulatory Visit (INDEPENDENT_AMBULATORY_CARE_PROVIDER_SITE_OTHER): Payer: Medicare Other | Admitting: Rheumatology

## 2018-04-25 ENCOUNTER — Encounter: Payer: Self-pay | Admitting: Rheumatology

## 2018-04-25 VITALS — BP 120/73 | HR 47 | Resp 14 | Ht 66.0 in | Wt 203.0 lb

## 2018-04-25 DIAGNOSIS — Z8679 Personal history of other diseases of the circulatory system: Secondary | ICD-10-CM

## 2018-04-25 DIAGNOSIS — M0579 Rheumatoid arthritis with rheumatoid factor of multiple sites without organ or systems involvement: Secondary | ICD-10-CM | POA: Diagnosis not present

## 2018-04-25 DIAGNOSIS — R5383 Other fatigue: Secondary | ICD-10-CM

## 2018-04-25 DIAGNOSIS — M19042 Primary osteoarthritis, left hand: Secondary | ICD-10-CM

## 2018-04-25 DIAGNOSIS — F5104 Psychophysiologic insomnia: Secondary | ICD-10-CM

## 2018-04-25 DIAGNOSIS — M19041 Primary osteoarthritis, right hand: Secondary | ICD-10-CM

## 2018-04-25 DIAGNOSIS — M19071 Primary osteoarthritis, right ankle and foot: Secondary | ICD-10-CM | POA: Diagnosis not present

## 2018-04-25 DIAGNOSIS — Z96652 Presence of left artificial knee joint: Secondary | ICD-10-CM | POA: Diagnosis not present

## 2018-04-25 DIAGNOSIS — Z8639 Personal history of other endocrine, nutritional and metabolic disease: Secondary | ICD-10-CM | POA: Diagnosis not present

## 2018-04-25 DIAGNOSIS — M5030 Other cervical disc degeneration, unspecified cervical region: Secondary | ICD-10-CM

## 2018-04-25 DIAGNOSIS — Z79899 Other long term (current) drug therapy: Secondary | ICD-10-CM

## 2018-04-25 DIAGNOSIS — I2510 Atherosclerotic heart disease of native coronary artery without angina pectoris: Secondary | ICD-10-CM

## 2018-04-25 DIAGNOSIS — M25512 Pain in left shoulder: Secondary | ICD-10-CM | POA: Diagnosis not present

## 2018-04-25 DIAGNOSIS — G8929 Other chronic pain: Secondary | ICD-10-CM

## 2018-04-25 DIAGNOSIS — Z96651 Presence of right artificial knee joint: Secondary | ICD-10-CM

## 2018-04-25 DIAGNOSIS — M19072 Primary osteoarthritis, left ankle and foot: Secondary | ICD-10-CM

## 2018-04-25 NOTE — Patient Instructions (Signed)
Shoulder Exercises Ask your health care provider which exercises are safe for you. Do exercises exactly as told by your health care provider and adjust them as directed. It is normal to feel mild stretching, pulling, tightness, or discomfort as you do these exercises, but you should stop right away if you feel sudden pain or your pain gets worse.Do not begin these exercises until told by your health care provider. RANGE OF MOTION EXERCISES These exercises warm up your muscles and joints and improve the movement and flexibility of your shoulder. These exercises also help to relieve pain, numbness, and tingling. These exercises involve stretching your injured shoulder directly. Exercise A: Pendulum  1. Stand near a wall or a surface that you can hold onto for balance. 2. Bend at the waist and let your left / right arm hang straight down. Use your other arm to support you. Keep your back straight and do not lock your knees. 3. Relax your left / right arm and shoulder muscles, and move your hips and your trunk so your left / right arm swings freely. Your arm should swing because of the motion of your body, not because you are using your arm or shoulder muscles. 4. Keep moving your body so your arm swings in the following directions, as told by your health care provider: ? Side to side. ? Forward and backward. ? In clockwise and counterclockwise circles. 5. Continue each motion for __________ seconds, or for as long as told by your health care provider. 6. Slowly return to the starting position. Repeat __________ times. Complete this exercise __________ times a day. Exercise B:Flexion, Standing  1. Stand and hold a broomstick, a cane, or a similar object. Place your hands a little more than shoulder-width apart on the object. Your left / right hand should be palm-up, and your other hand should be palm-down. 2. Keep your elbow straight and keep your shoulder muscles relaxed. Push the stick down with  your healthy arm to raise your left / right arm in front of your body, and then over your head until you feel a stretch in your shoulder. ? Avoid shrugging your shoulder while you raise your arm. Keep your shoulder blade tucked down toward the middle of your back. 3. Hold for __________ seconds. 4. Slowly return to the starting position. Repeat __________ times. Complete this exercise __________ times a day. Exercise C: Abduction, Standing 1. Stand and hold a broomstick, a cane, or a similar object. Place your hands a little more than shoulder-width apart on the object. Your left / right hand should be palm-up, and your other hand should be palm-down. 2. While keeping your elbow straight and your shoulder muscles relaxed, push the stick across your body toward your left / right side. Raise your left / right arm to the side of your body and then over your head until you feel a stretch in your shoulder. ? Do not raise your arm above shoulder height, unless your health care provider tells you to do that. ? Avoid shrugging your shoulder while you raise your arm. Keep your shoulder blade tucked down toward the middle of your back. 3. Hold for __________ seconds. 4. Slowly return to the starting position. Repeat __________ times. Complete this exercise __________ times a day. Exercise D:Internal Rotation  1. Place your left / right hand behind your back, palm-up. 2. Use your other hand to dangle an exercise band, a towel, or a similar object over your shoulder. Grasp the band with   your left / right hand so you are holding onto both ends. 3. Gently pull up on the band until you feel a stretch in the front of your left / right shoulder. ? Avoid shrugging your shoulder while you raise your arm. Keep your shoulder blade tucked down toward the middle of your back. 4. Hold for __________ seconds. 5. Release the stretch by letting go of the band and lowering your hands. Repeat __________ times. Complete  this exercise __________ times a day. STRETCHING EXERCISES These exercises warm up your muscles and joints and improve the movement and flexibility of your shoulder. These exercises also help to relieve pain, numbness, and tingling. These exercises are done using your healthy shoulder to help stretch the muscles of your injured shoulder. Exercise E: Corner Stretch (External Rotation and Abduction)  1. Stand in a doorway with one of your feet slightly in front of the other. This is called a staggered stance. If you cannot reach your forearms to the door frame, stand facing a corner of a room. 2. Choose one of the following positions as told by your health care provider: ? Place your hands and forearms on the door frame above your head. ? Place your hands and forearms on the door frame at the height of your head. ? Place your hands on the door frame at the height of your elbows. 3. Slowly move your weight onto your front foot until you feel a stretch across your chest and in the front of your shoulders. Keep your head and chest upright and keep your abdominal muscles tight. 4. Hold for __________ seconds. 5. To release the stretch, shift your weight to your back foot. Repeat __________ times. Complete this stretch __________ times a day. Exercise F:Extension, Standing 1. Stand and hold a broomstick, a cane, or a similar object behind your back. ? Your hands should be a little wider than shoulder-width apart. ? Your palms should face away from your back. 2. Keeping your elbows straight and keeping your shoulder muscles relaxed, move the stick away from your body until you feel a stretch in your shoulder. ? Avoid shrugging your shoulders while you move the stick. Keep your shoulder blade tucked down toward the middle of your back. 3. Hold for __________ seconds. 4. Slowly return to the starting position. Repeat __________ times. Complete this exercise __________ times a day. STRENGTHENING  EXERCISES These exercises build strength and endurance in your shoulder. Endurance is the ability to use your muscles for a long time, even after they get tired. Exercise G:External Rotation  1. Sit in a stable chair without armrests. 2. Secure an exercise band at elbow height on your left / right side. 3. Place a soft object, such as a folded towel or a small pillow, between your left / right upper arm and your body to move your elbow a few inches away (about 10 cm) from your side. 4. Hold the end of the band so it is tight and there is no slack. 5. Keeping your elbow pressed against the soft object, move your left / right forearm out, away from your abdomen. Keep your body steady so only your forearm moves. 6. Hold for __________ seconds. 7. Slowly return to the starting position. Repeat __________ times. Complete this exercise __________ times a day. Exercise H:Shoulder Abduction  1. Sit in a stable chair without armrests, or stand. 2. Hold a __________ weight in your left / right hand, or hold an exercise band with both hands.   3. Start with your arms straight down and your left / right palm facing in, toward your body. 4. Slowly lift your left / right hand out to your side. Do not lift your hand above shoulder height unless your health care provider tells you that this is safe. ? Keep your arms straight. ? Avoid shrugging your shoulder while you do this movement. Keep your shoulder blade tucked down toward the middle of your back. 5. Hold for __________ seconds. 6. Slowly lower your arm, and return to the starting position. Repeat __________ times. Complete this exercise __________ times a day. Exercise I:Shoulder Extension 1. Sit in a stable chair without armrests, or stand. 2. Secure an exercise band to a stable object in front of you where it is at shoulder height. 3. Hold one end of the exercise band in each hand. Your palms should face each other. 4. Straighten your elbows and  lift your hands up to shoulder height. 5. Step back, away from the secured end of the exercise band, until the band is tight and there is no slack. 6. Squeeze your shoulder blades together as you pull your hands down to the sides of your thighs. Stop when your hands are straight down by your sides. Do not let your hands go behind your body. 7. Hold for __________ seconds. 8. Slowly return to the starting position. Repeat __________ times. Complete this exercise __________ times a day. Exercise J:Standing Shoulder Row 1. Sit in a stable chair without armrests, or stand. 2. Secure an exercise band to a stable object in front of you so it is at waist height. 3. Hold one end of the exercise band in each hand. Your palms should be in a thumbs-up position. 4. Bend each of your elbows to an "L" shape (about 90 degrees) and keep your upper arms at your sides. 5. Step back until the band is tight and there is no slack. 6. Slowly pull your elbows back behind you. 7. Hold for __________ seconds. 8. Slowly return to the starting position. Repeat __________ times. Complete this exercise __________ times a day. Exercise K:Shoulder Press-Ups  1. Sit in a stable chair that has armrests. Sit upright, with your feet flat on the floor. 2. Put your hands on the armrests so your elbows are bent and your fingers are pointing forward. Your hands should be about even with the sides of your body. 3. Push down on the armrests and use your arms to lift yourself off of the chair. Straighten your elbows and lift yourself up as much as you comfortably can. ? Move your shoulder blades down, and avoid letting your shoulders move up toward your ears. ? Keep your feet on the ground. As you get stronger, your feet should support less of your body weight as you lift yourself up. 4. Hold for __________ seconds. 5. Slowly lower yourself back into the chair. Repeat __________ times. Complete this exercise __________ times a  day. Exercise L: Wall Push-Ups  1. Stand so you are facing a stable wall. Your feet should be about one arm-length away from the wall. 2. Lean forward and place your palms on the wall at shoulder height. 3. Keep your feet flat on the floor as you bend your elbows and lean forward toward the wall. 4. Hold for __________ seconds. 5. Straighten your elbows to push yourself back to the starting position. Repeat __________ times. Complete this exercise __________ times a day. This information is not intended to replace advice   given to you by your health care provider. Make sure you discuss any questions you have with your health care provider. Document Released: 03/15/2005 Document Revised: 01/24/2016 Document Reviewed: 01/10/2015 Elsevier Interactive Patient Education  2018 [employer]  

## 2018-05-13 ENCOUNTER — Telehealth: Payer: Self-pay | Admitting: Physician Assistant

## 2018-05-13 MED ORDER — PANTOPRAZOLE SODIUM 40 MG PO TBEC: 40.0000 mg | DELAYED_RELEASE_TABLET | Freq: Every day | ORAL | 0 refills | Status: DC

## 2018-05-13 NOTE — Telephone Encounter (Signed)
Refills sent in to pharmacy 

## 2018-05-13 NOTE — Telephone Encounter (Signed)
Received fax from pharmacy.  Pharmacy:[facility]   Medication:pantoprazole dr 40 mg tab  Qty:90  AQV:OHCS 1 tablet by mouth daily  Physician:[facility] [employer]

## 2018-05-14 ENCOUNTER — Other Ambulatory Visit (HOSPITAL_COMMUNITY): Payer: Self-pay | Admitting: *Deleted

## 2018-05-16 ENCOUNTER — Ambulatory Visit (HOSPITAL_COMMUNITY)
Admission: RE | Admit: 2018-05-16 | Discharge: 2018-05-16 | Disposition: A | Discharge status: Home / Self Care | Payer: Medicare Other | Source: Ambulatory Visit | Attending: Rheumatology | Admitting: Rheumatology

## 2018-05-16 DIAGNOSIS — M0579 Rheumatoid arthritis with rheumatoid factor of multiple sites without organ or systems involvement: Secondary | ICD-10-CM | POA: Diagnosis not present

## 2018-05-16 LAB — COMPREHENSIVE METABOLIC PANEL
ALT: 29 U/L (ref 0–44)
AST: 29 U/L (ref 15–41)
Albumin: 3.9 g/dL (ref 3.5–5.0)
Alkaline Phosphatase: 66 U/L (ref 38–126)
Anion gap: 10 (ref 5–15)
BUN: 20 mg/dL (ref 8–23)
CO2: 25 mmol/L (ref 22–32)
Calcium: 8.9 mg/dL (ref 8.9–10.3)
Chloride: 100 mmol/L (ref 98–111)
Creatinine, Ser: 1.3 mg/dL — ABNORMAL HIGH (ref 0.61–1.24)
GFR calc Af Amer: >60 mL/min (ref >60)
GFR calc non Af Amer: 55 mL/min — ABNORMAL LOW (ref >60)
Glucose, Bld: 211 mg/dL — ABNORMAL HIGH (ref 70–99)
Potassium: 3.9 mmol/L (ref 3.5–5.1)
Sodium: 135 mmol/L (ref 135–145)
Total Bilirubin: 0.8 mg/dL (ref 0.3–1.2)
Total Protein: 6.4 g/dL — ABNORMAL LOW (ref 6.5–8.1)

## 2018-05-16 LAB — CBC
HCT: 41.1 % (ref 39.0–52.0)
Hemoglobin: 13.9 g/dL (ref 13.0–17.0)
MCH: 30.5 pg (ref 26.0–34.0)
MCHC: 33.8 g/dL (ref 30.0–36.0)
MCV: 90.1 fL (ref 80.0–100.0)
Platelets: 263 10*3/uL (ref 150–400)
RBC: 4.56 MIL/uL (ref 4.22–5.81)
RDW: 12.1 % (ref 11.5–15.5)
WBC: 6 10*3/uL (ref 4.0–10.5)
nRBC: 0 % (ref 0.0–0.2)

## 2018-05-16 MED ORDER — DIPHENHYDRAMINE HCL 25 MG PO CAPS: 25.0000 mg | ORAL_CAPSULE | ORAL | Status: DC

## 2018-05-16 MED ORDER — ACETAMINOPHEN 325 MG PO TABS: 650.0000 mg | ORAL_TABLET | ORAL | Status: DC

## 2018-05-16 MED ORDER — SODIUM CHLORIDE 0.9 % IV SOLN
750.0000 mg | INTRAVENOUS | Status: DC
  Administered 2018-05-16: 750 mg via INTRAVENOUS
  Filled 2018-05-16: qty 30

## 2018-06-07 ENCOUNTER — Other Ambulatory Visit: Payer: Self-pay | Admitting: *Deleted

## 2018-06-07 NOTE — Progress Notes (Signed)
Infusion orders are current for patient CBC CMP Tylenol Benadryl appointments are up to date and follow up appointment  is scheduled TB gold due and order placed.  

## 2018-06-13 ENCOUNTER — Ambulatory Visit (HOSPITAL_COMMUNITY)
Admission: RE | Admit: 2018-06-13 | Discharge: 2018-06-13 | Disposition: A | Discharge status: Home / Self Care | Payer: Medicare Other | Source: Ambulatory Visit | Attending: Rheumatology | Admitting: Rheumatology

## 2018-06-13 DIAGNOSIS — M0579 Rheumatoid arthritis with rheumatoid factor of multiple sites without organ or systems involvement: Secondary | ICD-10-CM | POA: Diagnosis not present

## 2018-06-13 MED ORDER — ACETAMINOPHEN 325 MG PO TABS: 650.0000 mg | ORAL_TABLET | ORAL | Status: DC

## 2018-06-13 MED ORDER — SODIUM CHLORIDE 0.9 % IV SOLN
750.0000 mg | INTRAVENOUS | Status: DC
  Administered 2018-06-13: 750 mg via INTRAVENOUS
  Filled 2018-06-13: qty 30

## 2018-06-13 MED ORDER — DIPHENHYDRAMINE HCL 25 MG PO CAPS: 25.0000 mg | ORAL_CAPSULE | ORAL | Status: DC

## 2018-06-16 LAB — QUANTIFERON-TB GOLD PLUS: QuantiFERON-TB Gold Plus: NEGATIVE

## 2018-06-16 LAB — QUANTIFERON-TB GOLD PLUS (RQFGPL)
QuantiFERON Mitogen Value: >10 IU/mL
QuantiFERON Nil Value: 0.19 IU/mL
QuantiFERON TB1 Ag Value: 0.11 IU/mL
QuantiFERON TB2 Ag Value: 0.08 IU/mL

## 2018-06-16 NOTE — Progress Notes (Signed)
WNLs

## 2018-06-17 DIAGNOSIS — L723 Sebaceous cyst: Secondary | ICD-10-CM | POA: Diagnosis not present

## 2018-06-17 DIAGNOSIS — D485 Neoplasm of uncertain behavior of skin: Secondary | ICD-10-CM | POA: Diagnosis not present

## 2018-06-18 NOTE — Progress Notes (Signed)
Cardiology Office Note   Date:  06/19/2018   ID:  Joel Boston Oesterle Sr., DOB 01/14/1946, MRN [id number]  PCP:  Rennis Golden  Cardiologist: [facility]   Chief Complaint  Patient presents with  . Coronary Artery Disease  . Hypertension     History of Present Illness: Joel Divis Sosinski Sr. is a 73 y.o. male who presents for for going assessment and management of coronary artery disease, recent documented cardiac catheterization in 2004 with nonobstructive CAD.  Chest CT in 2013 revealed scattered atherosclerotic calcifications of the aorta and coronary arteries. A follow-up stress echocardiogram showed normal LV function without evidence of ischemia.  Other history includes hypercholesterolemia, hypertension, bradycardia.   He remains on statin therapy with myalgia pain diagnosed as rheumatoid arthritis. He is being followed by a rheumatoidologist. This has helped his pain considerably and allow him to have better ROM.   He offers no cardiac complaints today with the exception of some dizziness when bends over or gets up from his chair or out of bed.   He is medically compliant.   Past Medical History:  Diagnosis Date  . Anemia   . Arthritis    "all over"  . Bradycardia   . Chronic bronchitis ([place])    "get it just about q yr" (03/17/2014)  . Daily headache    "here lately" (03/17/2014); relates to sinuses  . GERD (gastroesophageal reflux disease)   . Hard of hearing    hearing aides bilat  . Hiatal hernia   . History of blood transfusion 2008   "related to OR"  . History of kidney stones   . Hyperlipidemia   . Hypertension   . Pneumonia 1972 X 1  . Type II diabetes mellitus ([place])   . Wears glasses     Past Surgical History:  Procedure Laterality Date  . broken finger      surgical repaired left hand 2nd finger   . cyst removed      per left knee/posteriorly  . CYSTECTOMY Left 2009   "behind knee"  . INGUINAL HERNIA REPAIR Right ?2010  . JOINT REPLACEMENT    . KNEE  ARTHROSCOPY Bilateral 1980's  . PARTIAL KNEE ARTHROPLASTY Right 08/27/2015   Procedure: RIGHT UNICOMPARTMENTAL KNEE ARTHROPLASTY;  Surgeon: Mcarthur Rossetti, MD;  Location: [facility];  Service: Orthopedics;  Laterality: Right;  . REVISION TOTAL KNEE ARTHROPLASTY Left 2008  . SHOULDER SURGERY Left 2019  . TOTAL KNEE ARTHROPLASTY Left 2003     Current Outpatient Medications  Medication Sig Dispense Refill  . Abatacept (ORENCIA IV) Inject into the vein.    Marland Kitchen amLODipine (NORVASC) 5 MG tablet TAKE 1 TABLET EVERY DAY 90 tablet 3  . aspirin EC 81 MG tablet Take 81 mg by mouth daily.    Marland Kitchen HUMALOG 100 UNIT/ML injection INJ UP TO 65 UNI [place] VIA INSULIN PUMP QD    . hydrochlorothiazide (HYDRODIURIL) 25 MG tablet TAKE 1 TABLET EVERY DAY 90 tablet 3  . insulin aspart (NOVOLOG FLEXPEN) 100 UNIT/ML FlexPen Inject 10 Units into the skin 3 (three) times daily with meals. pump    . KRILL OIL 1000 MG CAPS Take 1,000 mg by mouth daily.    . Lancets (ONETOUCH ULTRASOFT) lancets FOR USE WHEN CHECKING BLOOD SUGAR QID.  3  . methocarbamol (ROBAXIN) 500 MG tablet Take 1 tablet (500 mg total) by mouth at bedtime. 40 tablet 1  . Misc Natural Products (TART CHERRY ADVANCED PO) Take by mouth daily.    Marland Kitchen  Multiple Vitamin (MULTIVITAMIN WITH MINERALS) TABS Take 1 tablet by mouth daily.    . ONE TOUCH ULTRA TEST test strip USE WHEN CHECKING BLOOD SUGAR QID.  3  . pantoprazole (PROTONIX) 40 MG tablet Take 1 tablet (40 mg total) by mouth daily. 90 tablet 0  . pilocarpine (SALAGEN) 5 MG tablet Take 1 tablet (5 mg total) by mouth 3 (three) times daily as needed. (Patient not taking: Reported on 04/25/2018) 90 tablet 2  . pravastatin (PRAVACHOL) 40 MG tablet TAKE 1 TABLET EVERY EVENING 90 tablet 3  . Pyridoxine HCl (VITAMIN B-6 PO) Take by mouth daily.    . ramipril (ALTACE) 5 MG capsule TAKE 1 CAPSULE EVERY DAY 90 capsule 3  . vitamin B-12 (CYANOCOBALAMIN) 1000 MCG tablet Take 1,000 mcg by mouth daily.    Marland Kitchen VITAMIN D PO  Take by mouth daily.    . vitamin E 400 UNIT capsule Take 400 Units by mouth daily.     No current facility-administered medications for this visit.     Allergies:   Codeine; Invokana [canagliflozin]; Lipitor [atorvastatin]; Losartan; Vicodin [hydrocodone-acetaminophen]; and Roxicodone [oxycodone]    Social History:  The patient  reports that he quit smoking about 50 years ago. His smoking use included cigarettes and cigars. He has a 5.00 pack-year smoking history. He has never used smokeless tobacco. He reports that he does not drink alcohol or use drugs.   Family History:  The patient's family history includes Cancer in his father; Heart disease in an other family member.    ROS: All other systems are reviewed and negative. Unless otherwise mentioned in H&P    PHYSICAL EXAM: VS:  BP (!) 106/56   Pulse (!) 47   Ht 5\' 6"  (1.676 m)   Wt 202 lb 12.8 oz (92 kg)   BMI 32.73 kg/m  , BMI Body mass index is 32.73 kg/m. GEN: Well nourished, well developed, in no acute distress HEENT: normal Neck: no JVD, carotid bruits, or masses Cardiac: RRR bradycardia; no murmurs, rubs, or gallops,no edema  Respiratory:  Clear to auscultation bilaterally, normal work of breathing GI: soft, nontender, nondistended, + BS MS: no deformity or atrophy Skin: warm and dry, no rash Neuro:  Strength and sensation are intact Psych: euthymic mood, full affect   EKG:  Sinus bradycardiac rate of 47 bpm.  Recent Labs: 05/16/2018: ALT 29; BUN 20; Creatinine, Ser 1.30; Hemoglobin 13.9; Platelets 263; Potassium 3.9; Sodium 135    Lipid Panel    Component Value Date/Time   CHOL 130 06/27/2016   TRIG 185 (A) 06/27/2016   HDL 39 06/27/2016   CHOLHDL 4 05/14/2012 0735   VLDL 18.6 05/14/2012 0735   LDLCALC 54 06/27/2016      Wt Readings from Last 3 Encounters:  06/19/18 202 lb 12.8 oz (92 kg)  06/13/18 196 lb (88.9 kg)  05/16/18 203 lb (92.1 kg)    Other studies Reviewed: Echocardiogram 07/20/17 Left  ventricle: The cavity size was normal. Wall thickness was   increased in a pattern of mild LVH. Systolic function was normal.   The estimated ejection fraction was in the range of 60% to 65%.   Wall motion was normal; there were no regional wall motion   abnormalities. Doppler parameters are consistent with abnormal   left ventricular relaxation (grade 1 diastolic dysfunction). - Aortic valve: There was no stenosis. - Mitral valve: There was no significant regurgitation. - Left atrium: The atrium was mildly dilated. - Right ventricle: The cavity size was  normal. Systolic function   was normal. - Tricuspid valve: Peak RV-RA gradient (S): 31 mm Hg. - Pulmonary arteries: PA peak pressure: 34 mm Hg (S). - Inferior vena cava: The vessel was normal in size. The   respirophasic diameter changes were in the normal range (= 50%),   consistent with normal central venous pressure.  Impressions:  - Normal LV size with mild LV hypertrophy. EF 60-65%. Normal RV   size and systolic function. No significant valvular   abnormalities.  ASSESSMENT AND PLAN:  1. Hypertension: He has lost weight and has had his pain controlled from RA. BP is reflective of this. Now running under a 715 systolic. I will reduce the dose of amlodipine from 5 mg to 2.5 mg daily. He is also on HCTZ 25 mg daily, will consider reducing this dose to 12.5 mg daily on follow up. He is to take his BP daily and record. He is to report to [place] if BP continues to be <953 systolic.   2.Bradycardia: Rates at rest are in the 40's. He states that this is normal for him. He is not having chest pain or DOE. He is not on any rate reducing medications currently. We will take a 'watch and wait' plan with this. Consider repeat cardiac monitor if he becomes symptomatic.    3. RA: Being followed by Rheumatologist    Current medicines are reviewed at length with the patient today.    Labs/ tests ordered today include: None  Phill Myron. [place] Pugh, ANP, AACC   06/19/2018 8:16 AM    [facility] [address] Office [phone] Fax [phone]

## 2018-06-19 ENCOUNTER — Encounter: Payer: Self-pay | Admitting: Adult Health

## 2018-06-19 ENCOUNTER — Ambulatory Visit (INDEPENDENT_AMBULATORY_CARE_PROVIDER_SITE_OTHER): Payer: Medicare Other | Admitting: Adult Health

## 2018-06-19 VITALS — BP 106/56 | HR 47 | Ht 66.0 in | Wt 202.8 lb

## 2018-06-19 DIAGNOSIS — R001 Bradycardia, unspecified: Secondary | ICD-10-CM

## 2018-06-19 DIAGNOSIS — I10 Essential (primary) hypertension: Secondary | ICD-10-CM

## 2018-06-19 DIAGNOSIS — M0579 Rheumatoid arthritis with rheumatoid factor of multiple sites without organ or systems involvement: Secondary | ICD-10-CM | POA: Diagnosis not present

## 2018-06-19 MED ORDER — AMLODIPINE BESYLATE 2.5 MG PO TABS: 2.5000 mg | ORAL_TABLET | Freq: Every day | ORAL | Status: DC

## 2018-06-19 NOTE — Patient Instructions (Signed)
Medication Instructions:  DECREASE AMLODIPINE 2.5MG  DAILY If you need a refill on your cardiac medications before your next appointment, please call your pharmacy.  Labwork: NONE  When you have your labs (blood work) drawn today and your tests are completely normal, you will receive your results only by MyChart Message (if you have MyChart) -OR-  A paper copy in the mail.  If you have any lab test that is abnormal or we need to change your treatment, we will call you to review these results.  Special Instructions: TAKE AND LOG YOUR BP DAILY  Follow-Up: You will need a follow up appointment in 3-4 months.  You [place] see Kirk Ruths, MD ONLY or one of the following Advanced Practice Providers on your designated Care Team:  Kerin Ransom, [place]  Roby Lofts, PA-C Sande Rives, [place]  At [facility], you and your health needs are our priority.  As part of our continuing mission to provide you with exceptional heart care, we have created designated Provider Care Teams.  These Care Teams include your primary Cardiologist (physician) and Advanced Practice Providers (APPs -  Physician Assistants and Nurse Practitioners) who all work together to provide you with the care you need, when you need it.  Thank you for choosing [facility] at [facility]!!

## 2018-06-28 DIAGNOSIS — Z794 Long term (current) use of insulin: Secondary | ICD-10-CM | POA: Diagnosis not present

## 2018-06-28 DIAGNOSIS — I10 Essential (primary) hypertension: Secondary | ICD-10-CM | POA: Diagnosis not present

## 2018-06-28 DIAGNOSIS — E1122 Type 2 diabetes mellitus with diabetic chronic kidney disease: Secondary | ICD-10-CM | POA: Diagnosis not present

## 2018-06-28 DIAGNOSIS — N182 Chronic kidney disease, stage 2 (mild): Secondary | ICD-10-CM | POA: Diagnosis not present

## 2018-07-05 ENCOUNTER — Other Ambulatory Visit: Payer: Self-pay | Admitting: *Deleted

## 2018-07-05 NOTE — Progress Notes (Signed)
Infusion orders are current for patient CBC CMP Tylenol Benadryl appointments are up to date and follow up appointment  is scheduled TB gold not due yet.  

## 2018-07-11 ENCOUNTER — Ambulatory Visit (HOSPITAL_COMMUNITY)
Admission: RE | Admit: 2018-07-11 | Discharge: 2018-07-11 | Disposition: A | Discharge status: Home / Self Care | Payer: Medicare Other | Source: Ambulatory Visit | Attending: Rheumatology | Admitting: Rheumatology

## 2018-07-11 ENCOUNTER — Telehealth: Payer: Self-pay | Admitting: Rheumatology

## 2018-07-11 DIAGNOSIS — M0579 Rheumatoid arthritis with rheumatoid factor of multiple sites without organ or systems involvement: Secondary | ICD-10-CM | POA: Insufficient documentation

## 2018-07-11 LAB — CBC
HCT: 40.3 % (ref 39.0–52.0)
Hemoglobin: 13.4 g/dL (ref 13.0–17.0)
MCH: 30.3 pg (ref 26.0–34.0)
MCHC: 33.3 g/dL (ref 30.0–36.0)
MCV: 91.2 fL (ref 80.0–100.0)
Platelets: 239 10*3/uL (ref 150–400)
RBC: 4.42 MIL/uL (ref 4.22–5.81)
RDW: 12.5 % (ref 11.5–15.5)
WBC: 6.1 10*3/uL (ref 4.0–10.5)
nRBC: 0 % (ref 0.0–0.2)

## 2018-07-11 LAB — COMPREHENSIVE METABOLIC PANEL
ALT: 27 U/L (ref 0–44)
AST: 31 U/L (ref 15–41)
Albumin: 3.6 g/dL (ref 3.5–5.0)
Alkaline Phosphatase: 60 U/L (ref 38–126)
Anion gap: 8 (ref 5–15)
BILIRUBIN TOTAL: 0.8 mg/dL (ref 0.3–1.2)
BUN: 19 mg/dL (ref 8–23)
CO2: 24 mmol/L (ref 22–32)
Calcium: 8.7 mg/dL — ABNORMAL LOW (ref 8.9–10.3)
Chloride: 107 mmol/L (ref 98–111)
Creatinine, Ser: 1.17 mg/dL (ref 0.61–1.24)
GFR calc Af Amer: >60 mL/min (ref >60)
GFR calc non Af Amer: >60 mL/min (ref >60)
Glucose, Bld: 160 mg/dL — ABNORMAL HIGH (ref 70–99)
Potassium: 3.7 mmol/L (ref 3.5–5.1)
Sodium: 139 mmol/L (ref 135–145)
TOTAL PROTEIN: 6.2 g/dL — AB (ref 6.5–8.1)

## 2018-07-11 MED ORDER — ACETAMINOPHEN 325 MG PO TABS: 650.0000 mg | ORAL_TABLET | ORAL | Status: DC

## 2018-07-11 MED ORDER — DIPHENHYDRAMINE HCL 25 MG PO CAPS: 25.0000 mg | ORAL_CAPSULE | ORAL | Status: DC

## 2018-07-11 MED ORDER — SODIUM CHLORIDE 0.9 % IV SOLN
750.0000 mg | INTRAVENOUS | Status: AC
  Administered 2018-07-11: 750 mg via INTRAVENOUS
  Filled 2018-07-11: qty 30

## 2018-07-11 NOTE — Telephone Encounter (Signed)
Patient called stating he was returning your call.   

## 2018-07-11 NOTE — Progress Notes (Signed)
Labs are stable.

## 2018-07-11 NOTE — Telephone Encounter (Signed)
Patient advised lab results are stable.

## 2018-07-19 ENCOUNTER — Other Ambulatory Visit: Payer: Self-pay | Admitting: *Deleted

## 2018-07-19 DIAGNOSIS — M0579 Rheumatoid arthritis with rheumatoid factor of multiple sites without organ or systems involvement: Secondary | ICD-10-CM

## 2018-08-07 ENCOUNTER — Other Ambulatory Visit: Payer: Self-pay

## 2018-08-08 ENCOUNTER — Other Ambulatory Visit: Payer: Self-pay | Admitting: Family Medicine

## 2018-08-08 ENCOUNTER — Ambulatory Visit (HOSPITAL_COMMUNITY)
Admission: RE | Admit: 2018-08-08 | Discharge: 2018-08-08 | Disposition: A | Discharge status: Home / Self Care | Payer: Medicare Other | Source: Ambulatory Visit | Attending: Rheumatology | Admitting: Rheumatology

## 2018-08-08 ENCOUNTER — Other Ambulatory Visit: Payer: Self-pay

## 2018-08-08 DIAGNOSIS — M0579 Rheumatoid arthritis with rheumatoid factor of multiple sites without organ or systems involvement: Secondary | ICD-10-CM | POA: Insufficient documentation

## 2018-08-08 MED ORDER — DIPHENHYDRAMINE HCL 25 MG PO CAPS: 25.0000 mg | ORAL_CAPSULE | ORAL | Status: DC

## 2018-08-08 MED ORDER — SODIUM CHLORIDE 0.9 % IV SOLN
750.0000 mg | INTRAVENOUS | Status: DC
  Administered 2018-08-08: 750 mg via INTRAVENOUS
  Filled 2018-08-08: qty 30

## 2018-08-08 MED ORDER — ACETAMINOPHEN 325 MG PO TABS: 650.0000 mg | ORAL_TABLET | ORAL | Status: DC

## 2018-09-04 ENCOUNTER — Telehealth: Payer: Self-pay | Admitting: Rheumatology

## 2018-09-04 ENCOUNTER — Other Ambulatory Visit: Payer: Self-pay | Admitting: *Deleted

## 2018-09-04 DIAGNOSIS — M0579 Rheumatoid arthritis with rheumatoid factor of multiple sites without organ or systems involvement: Secondary | ICD-10-CM

## 2018-09-04 NOTE — Telephone Encounter (Signed)
[facility] called requesting order be sent for patient's Orencia infusion which is scheduled for tomorrow 09/05/18.

## 2018-09-04 NOTE — Telephone Encounter (Signed)
Infusion orders have been placed.

## 2018-09-05 ENCOUNTER — Other Ambulatory Visit: Payer: Self-pay

## 2018-09-05 ENCOUNTER — Ambulatory Visit (HOSPITAL_COMMUNITY)
Admission: RE | Admit: 2018-09-05 | Discharge: 2018-09-05 | Disposition: A | Discharge status: Home / Self Care | Payer: Medicare Other | Source: Ambulatory Visit | Attending: Rheumatology | Admitting: Rheumatology

## 2018-09-05 DIAGNOSIS — M0579 Rheumatoid arthritis with rheumatoid factor of multiple sites without organ or systems involvement: Secondary | ICD-10-CM

## 2018-09-05 LAB — COMPREHENSIVE METABOLIC PANEL
ALT: 23 U/L (ref 0–44)
AST: 29 U/L (ref 15–41)
Albumin: 3.8 g/dL (ref 3.5–5.0)
Alkaline Phosphatase: 83 U/L (ref 38–126)
Anion gap: 9 (ref 5–15)
BUN: 20 mg/dL (ref 8–23)
CO2: 25 mmol/L (ref 22–32)
Calcium: 9 mg/dL (ref 8.9–10.3)
Chloride: 103 mmol/L (ref 98–111)
Creatinine, Ser: 1.08 mg/dL (ref 0.61–1.24)
GFR calc Af Amer: >60 mL/min (ref >60)
GFR calc non Af Amer: >60 mL/min (ref >60)
Glucose, Bld: 188 mg/dL — ABNORMAL HIGH (ref 70–99)
Potassium: 3.7 mmol/L (ref 3.5–5.1)
Sodium: 137 mmol/L (ref 135–145)
Total Bilirubin: 1.1 mg/dL (ref 0.3–1.2)
Total Protein: 6.6 g/dL (ref 6.5–8.1)

## 2018-09-05 LAB — CBC
HCT: 41.2 % (ref 39.0–52.0)
Hemoglobin: 14.1 g/dL (ref 13.0–17.0)
MCH: 30.9 pg (ref 26.0–34.0)
MCHC: 34.2 g/dL (ref 30.0–36.0)
MCV: 90.2 fL (ref 80.0–100.0)
Platelets: 265 10*3/uL (ref 150–400)
RBC: 4.57 MIL/uL (ref 4.22–5.81)
RDW: 12.9 % (ref 11.5–15.5)
WBC: 5.2 10*3/uL (ref 4.0–10.5)
nRBC: 0 % (ref 0.0–0.2)

## 2018-09-05 MED ORDER — ACETAMINOPHEN 325 MG PO TABS: 650.0000 mg | ORAL_TABLET | ORAL | Status: DC

## 2018-09-05 MED ORDER — DIPHENHYDRAMINE HCL 25 MG PO CAPS: 25.0000 mg | ORAL_CAPSULE | ORAL | Status: DC

## 2018-09-05 MED ORDER — SODIUM CHLORIDE 0.9 % IV SOLN
750.0000 mg | INTRAVENOUS | Status: DC
  Administered 2018-09-05: 750 mg via INTRAVENOUS
  Filled 2018-09-05 (×2): qty 30

## 2018-09-05 NOTE — Progress Notes (Signed)
Cmp- normal

## 2018-09-05 NOTE — Progress Notes (Signed)
CBC normal

## 2018-09-16 ENCOUNTER — Telehealth: Payer: Self-pay

## 2018-09-16 ENCOUNTER — Telehealth: Payer: Self-pay | Admitting: Cardiology

## 2018-09-16 NOTE — Telephone Encounter (Signed)
Home phone/ consent/ my chart/ pre reg completed °

## 2018-09-16 NOTE — Telephone Encounter (Signed)
Virtual Visit Pre-Appointment Phone Call  "(Name), I am calling you today to discuss your upcoming appointment. We are currently trying to limit exposure to the virus that causes COVID-19 by seeing patients at home rather than in the office."  1. "What is the BEST phone number to call the day of the visit?" - include this in appointment notes  2. "Do you have or have access to (through a family member/friend) a smartphone with video capability that we can use for your visit?" a. If yes - list this number in appt notes as "cell" (if different from BEST phone #) and list the appointment type as a VIDEO visit in appointment notes b. If no - list the appointment type as a PHONE visit in appointment notes  3. Confirm consent - "In the setting of the current Covid19 crisis, you are scheduled for a TELEPHONE visit with Dr. Stanford Breed on 09/17/2018 at 8:20AM.  Just as we do with many in-office visits, in order for you to participate in this visit, we must obtain consent.  If you'd like, I can send this to your mychart (if signed up) or email for you to review.  Otherwise, I can obtain your verbal consent now.  All virtual visits are billed to your insurance company just like a normal visit would be.  By agreeing to a virtual visit, we'd like you to understand that the technology does not allow for your provider to perform an examination, and thus Carder limit your provider's ability to fully assess your condition. If your provider identifies any concerns that need to be evaluated in person, we will make arrangements to do so.  Finally, though the technology is pretty good, we cannot assure that it will always work on either your or our end, and in the setting of a video visit, we [place] have to convert it to a phone-only visit.  In either situation, we cannot ensure that we have a secure connection.  Are you willing to proceed?" STAFF: Did the patient verbally acknowledge consent to telehealth visit? Document YES/NO  here: YES  4. Advise patient to be prepared - "Two hours prior to your appointment, go ahead and check your blood pressure, pulse, oxygen saturation, and your weight (if you have the equipment to check those) and write them all down. When your visit starts, your provider will ask you for this information. If you have an Apple Watch or Kardia device, please plan to have heart rate information ready on the day of your appointment. Please have a pen and paper handy nearby the day of the visit as well."  5. Give patient instructions for MyChart download to smartphone OR Doximity/Doxy.me as below if video visit (depending on what platform provider is using)  6. Inform patient they will receive a phone call 15 minutes prior to their appointment time (Craigie be from unknown caller ID) so they should be prepared to answer    TELEPHONE CALL NOTE  Joel Boston Medine Sr. has been deemed a candidate for a follow-up tele-health visit to limit community exposure during the Covid-19 pandemic. I spoke with the patient via phone to ensure availability of phone/video source, confirm preferred email & phone number, and discuss instructions and expectations.  I reminded Joel Macmurray Cerasoli Sr. to be prepared with any vital sign and/or heart rhythm information that could potentially be obtained via home monitoring, at the time of his visit. I reminded Joel Moltz Ahlgren Sr. to expect a phone call prior  to his visit.  Jacqulynn Cadet, CMA 09/16/2018 8:24 AM   INSTRUCTIONS FOR DOWNLOADING THE MYCHART APP TO SMARTPHONE  - The patient must first make sure to have activated MyChart and know their login information - If Apple, go to [employer] and type in MyChart in the search bar and download the app. If Android, ask patient to go to [employer] and type in Friendly in the search bar and download the app. The app is free but as with any other app downloads, their phone Saulter require them to verify saved payment information or Apple/Android  password.  - The patient will need to then log into the app with their MyChart username and password, and select [facility] as their healthcare provider to link the account. When it is time for your visit, go to the MyChart app, find appointments, and click Begin Video Visit. Be sure to Select Allow for your device to access the Microphone and Camera for your visit. You will then be connected, and your provider will be with you shortly.  **If they have any issues connecting, or need assistance please contact MyChart service desk [phone])**  **If using a computer, in order to ensure the best quality for their visit they will need to use either of the following Internet Browsers: [employer], or Google Chrome**  IF USING DOXIMITY or DOXY.ME - The patient will receive a link just prior to their visit by text.     FULL LENGTH CONSENT FOR TELE-HEALTH VISIT   I hereby voluntarily request, consent and authorize [facility] and its employed or contracted physicians, physician assistants, nurse practitioners or other licensed health care professionals (the Practitioner), to provide me with telemedicine health care services (the "Services") as deemed necessary by the treating Practitioner. I acknowledge and consent to receive the Services by the Practitioner via telemedicine. I understand that the telemedicine visit will involve communicating with the Practitioner through live audiovisual communication technology and the disclosure of certain medical information by electronic transmission. I acknowledge that I have been given the opportunity to request an in-person assessment or other available alternative prior to the telemedicine visit and am voluntarily participating in the telemedicine visit.  I understand that I have the right to withhold or withdraw my consent to the use of telemedicine in the course of my care at any time, without affecting my right to future care or treatment,  and that the Practitioner or I Slone terminate the telemedicine visit at any time. I understand that I have the right to inspect all information obtained and/or recorded in the course of the telemedicine visit and [place] receive copies of available information for a reasonable fee.  I understand that some of the potential risks of receiving the Services via telemedicine include:  Marland Kitchen Delay or interruption in medical evaluation due to technological equipment failure or disruption; . Information transmitted [place] not be sufficient (e.g. poor resolution of images) to allow for appropriate medical decision making by the Practitioner; and/or  . In rare instances, security protocols could fail, causing a breach of personal health information.  Furthermore, I acknowledge that it is my responsibility to provide information about my medical history, conditions and care that is complete and accurate to the best of my ability. I acknowledge that Practitioner's advice, recommendations, and/or decision [place] be based on factors not within their control, such as incomplete or inaccurate data provided by me or distortions of diagnostic images or specimens that Constante result from electronic transmissions.  I understand that the practice of medicine is not an exact science and that Practitioner makes no warranties or guarantees regarding treatment outcomes. I acknowledge that I will receive a copy of this consent concurrently upon execution via email to the email address I last provided but [place] also request a printed copy by calling the office of [facility].    I understand that my insurance will be billed for this visit.   I have read or had this consent read to me. . I understand the contents of this consent, which adequately explains the benefits and risks of the Services being provided via telemedicine.  . I have been provided ample opportunity to ask questions regarding this consent and the Services and have had my questions  answered to my satisfaction. . I give my informed consent for the services to be provided through the use of telemedicine in my medical care  By participating in this telemedicine visit I agree to the above.

## 2018-09-16 NOTE — Progress Notes (Signed)
Virtual Visit via Video Note to phone visit per patient request as no smart phone.   This visit type was conducted due to national recommendations for restrictions regarding the COVID-19 Pandemic (e.g. social distancing) in an effort to limit this patient's exposure and mitigate transmission in our community.  Due to his co-morbid illnesses, this patient is at least at moderate risk for complications without adequate follow up.  This format is felt to be most appropriate for this patient at this time.  All issues noted in this document were discussed and addressed.  A limited physical exam was performed with this format.  Please refer to the patient's chart for his consent to telehealth for [facility].   Date:  09/17/2018   ID:  Joel Boston Gilani Sr., DOB [birth year], MRN [id number]  Patient Location: Home Provider Location: Home  PCP:  Orlena Sheldon, PA-C  Cardiologist:  Kirk Ruths, MD   Evaluation Performed:  Follow-Up Visit  Chief Complaint:  FU CAD  History of Present Illness:    FU CAD; cardiac catheterization in 2004 showed nonobstructive disease. Chest CT in November of 2013 showed scattered atherosclerotic calcifications in the aorta and coronary arteries. Stress echocardiogram in November of 2013 showed normal LV function and no ischemia. Abdominal ultrasound showed no aneurysm and no acute hepatobiliary findings. Nuclear study November 2015 showed an ejection fraction of 63% and no ischemia or infarction. Carotid Dopplers October 2017 showed minimal amount of plaque bilaterally.  Echocardiogram February 2019 showed normal LV function, mild diastolic dysfunction, mild left atrial enlargement.  Since last seen patient the patient has dyspnea with more extreme activities but not with routine activities. It is relieved with rest. It is not associated with chest pain. There is no orthopnea, PND or pedal edema. There is no syncope or palpitations. There is no exertional chest pain.   The  patient does not have symptoms concerning for COVID-19 infection (fever, chills, cough, or new shortness of breath).    Past Medical History:  Diagnosis Date  . Anemia   . Arthritis    "all over"  . Bradycardia   . Chronic bronchitis ([place])    "get it just about q yr" (03/17/2014)  . Daily headache    "here lately" (03/17/2014); relates to sinuses  . GERD (gastroesophageal reflux disease)   . Hard of hearing    hearing aides bilat  . Hiatal hernia   . History of blood transfusion 2008   "related to OR"  . History of kidney stones   . Hyperlipidemia   . Hypertension   . Pneumonia 1972 X 1  . Type II diabetes mellitus ([place])   . Wears glasses    Past Surgical History:  Procedure Laterality Date  . broken finger      surgical repaired left hand 2nd finger   . cyst removed      per left knee/posteriorly  . CYSTECTOMY Left 2009   "behind knee"  . INGUINAL HERNIA REPAIR Right ?2010  . JOINT REPLACEMENT    . KNEE ARTHROSCOPY Bilateral 1980's  . PARTIAL KNEE ARTHROPLASTY Right 08/27/2015   Procedure: RIGHT UNICOMPARTMENTAL KNEE ARTHROPLASTY;  Surgeon: Mcarthur Rossetti, MD;  Location: [facility];  Service: Orthopedics;  Laterality: Right;  . REVISION TOTAL KNEE ARTHROPLASTY Left 2008  . SHOULDER SURGERY Left 2019  . TOTAL KNEE ARTHROPLASTY Left 2003     Current Meds  Medication Sig  . Abatacept (ORENCIA IV) Inject into the vein.  Marland Kitchen amLODipine (NORVASC) 2.5 MG  tablet Take 1 tablet (2.5 mg total) by mouth daily.  . Ascorbic Acid (VITAMIN C PO) Take by mouth daily.  Marland Kitchen aspirin EC 81 MG tablet Take 81 mg by mouth daily.  Marland Kitchen HUMALOG 100 UNIT/ML injection INJ UP TO 65 UNI [place] VIA INSULIN PUMP QD  . hydrochlorothiazide (HYDRODIURIL) 25 MG tablet TAKE 1 TABLET EVERY DAY  . insulin aspart (NOVOLOG FLEXPEN) 100 UNIT/ML FlexPen Inject 1 Units into the skin 3 (three) times daily with meals. 1 unit per hour  . KRILL OIL 1000 MG CAPS Take 1,000 mg by mouth daily.  . Lancets (ONETOUCH  ULTRASOFT) lancets FOR USE WHEN CHECKING BLOOD SUGAR QID.  Marland Kitchen Misc Natural Products (TART CHERRY ADVANCED PO) Take by mouth daily.  . Multiple Vitamin (MULTIVITAMIN WITH MINERALS) TABS Take 1 tablet by mouth daily.  . ONE TOUCH ULTRA TEST test strip USE WHEN CHECKING BLOOD SUGAR QID.  Marland Kitchen pantoprazole (PROTONIX) 40 MG tablet TAKE 1 TABLET (40 MG TOTAL) BY MOUTH DAILY.  Marland Kitchen POTASSIUM PO Take by mouth daily.  . pravastatin (PRAVACHOL) 40 MG tablet TAKE 1 TABLET EVERY EVENING  . Pyridoxine HCl (VITAMIN B-6 PO) Take by mouth daily.  . ramipril (ALTACE) 5 MG capsule TAKE 1 CAPSULE EVERY DAY  . vitamin B-12 (CYANOCOBALAMIN) 1000 MCG tablet Take 1,000 mcg by mouth daily.  Marland Kitchen VITAMIN D PO Take by mouth daily.  . vitamin E 400 UNIT capsule Take 400 Units by mouth daily.     Allergies:   Codeine; Lipitor [atorvastatin]; Invokana [canagliflozin]; Losartan; Roxicodone [oxycodone]; and Vicodin [hydrocodone-acetaminophen]   Social History   Tobacco Use  . Smoking status: Former Smoker    Packs/day: 1.00    Years: 5.00    Pack years: 5.00    Types: Cigarettes, Cigars    Last attempt to quit: 05/15/1968    Years since quitting: 50.3  . Smokeless tobacco: Never Used  Substance Use Topics  . Alcohol use: No    Alcohol/week: 0.0 standard drinks  . Drug use: Never     Family Hx: The patient's family history includes Cancer in his father; Heart disease in an other family member.  ROS:   Please see the history of present illness.    No fevers, chills or productive cough. All other systems reviewed and are negative.  Recent Labs: 09/05/2018: ALT 23; BUN 20; Creatinine, Ser 1.08; Hemoglobin 14.1; Platelets 265; Potassium 3.7; Sodium 137   Recent Lipid Panel Lab Results  Component Value Date/Time   CHOL 130 06/27/2016   TRIG 185 (A) 06/27/2016   HDL 39 06/27/2016   CHOLHDL 4 05/14/2012 07:35 AM   LDLCALC 54 06/27/2016    Wt Readings from Last 3 Encounters:  09/17/18 199 lb (90.3 kg)  09/05/18  196 lb (88.9 kg)  08/08/18 195 lb (88.5 kg)     Objective:    Vital Signs:  BP (!) 158/83   Pulse (!) 42   Ht 5\' 6"  (1.676 m)   Wt 199 lb (90.3 kg)   BMI 32.12 kg/m    VITAL SIGNS:  reviewed  No acute distress Answers questions appropriately. Normal affect Remainder of physical examination not performed (telehealth visit; coronavirus pandemic)  ASSESSMENT & PLAN:    1. Coronary artery disease-patient denies chest pain.  Continue medical therapy with aspirin and statin. 2. Hypertension-patient's blood pressure is elevated; increase amlodipine to 5 mg daily and follow. 3. History of bradycardia-no symptoms.  We will continue to avoid AV nodal blocking agents. 4. Hyperlipidemia-continue statin  COVID-19 Education: The importance of social distancing was discussed today.  Time:   Today, I have spent 10 minutes with the patient with telehealth technology discussing the above problems.     Medication Adjustments/Labs and Tests Ordered: Current medicines are reviewed at length with the patient today.  Concerns regarding medicines are outlined above.   Tests Ordered: No orders of the defined types were placed in this encounter.   Medication Changes: No orders of the defined types were placed in this encounter.   Disposition:  Follow up in 6 month(s)  Signed, Kirk Ruths, MD  09/17/2018 8:19 AM    [place]

## 2018-09-17 ENCOUNTER — Encounter: Payer: Self-pay | Admitting: Cardiology

## 2018-09-17 ENCOUNTER — Telehealth (INDEPENDENT_AMBULATORY_CARE_PROVIDER_SITE_OTHER): Payer: Medicare Other | Admitting: Cardiology

## 2018-09-17 VITALS — BP 158/83 | HR 42 | Ht 66.0 in | Wt 199.0 lb

## 2018-09-17 DIAGNOSIS — I10 Essential (primary) hypertension: Secondary | ICD-10-CM

## 2018-09-17 DIAGNOSIS — I2510 Atherosclerotic heart disease of native coronary artery without angina pectoris: Secondary | ICD-10-CM

## 2018-09-17 DIAGNOSIS — E7800 Pure hypercholesterolemia, unspecified: Secondary | ICD-10-CM

## 2018-09-17 MED ORDER — AMLODIPINE BESYLATE 5 MG PO TABS: 5.0000 mg | ORAL_TABLET | Freq: Every day | ORAL | 3 refills | Status: DC

## 2018-09-17 NOTE — Patient Instructions (Signed)
Medication Instructions:  INCREASE AMLODIPINE TO 5 MG ONCE DAILY= 2 OF THE 2.5 MG TABLETS ONCE DAILY If you need a refill on your cardiac medications before your next appointment, please call your pharmacy.   Lab work: If you have labs (blood work) drawn today and your tests are completely normal, you will receive your results only by: Marland Kitchen MyChart Message (if you have MyChart) OR . A paper copy in the mail If you have any lab test that is abnormal or we need to change your treatment, we will call you to review the results.  Follow-Up: At [facility], you and your health needs are our priority.  As part of our continuing mission to provide you with exceptional heart care, we have created designated Provider Care Teams.  These Care Teams include your primary Cardiologist (physician) and Advanced Practice Providers (APPs -  Physician Assistants and Nurse Practitioners) who all work together to provide you with the care you need, when you need it. You will need a follow up appointment in 6 months.  Please call our office 2 months in advance to schedule this appointment.  You [place] see Kirk Ruths, MD or one of the following Advanced Practice Providers on your designated Care Team:   Kerin Ransom, PA-C Roby Lofts, [place] . Sande Rives, PA-C

## 2018-09-23 NOTE — Progress Notes (Addendum)
Office Visit Note  Patient: Joel Bonelli Shepperson Sr.             Date of Birth: 1945-07-29           MRN: [id number]             PCP: Rennis Golden Referring: Orlena Sheldon, PA-C Visit Date: 09/25/2018 Occupation: _0 @  Subjective:  Pain in both hands   History of Present Illness: Joel Wandell Rennels Sr. is a 73 y.o. male with history of seropositive rheumatoid arthritis, osteoarthritis, and DDD.  He is receiving IV Orencia 750 mg infusions every 4 weeks.  He denies any recent rheumatoid arthritis flares.  He continues to have pain and stiffness in both hands which he attributes to osteoarthritis.  He states that in the morning he runs his hands under warm water and his stiffness usually subsides within 5 to 10 minutes.  He continues to have pain and stiffness in bilateral knee replacements.  He states he has more discomfort in the right knee that is partially replaced.  He has occasional discomfort in his left shoulder but she had previous arthroscopic surgery on.  He denies any neck pain or symptoms of radiculopathy at this time.  He denies any feet pain or joint swelling.   Activities of Daily Living:  Patient reports morning stiffness for 5-10 minutes Patient Reports nocturnal pain.  Difficulty dressing/grooming: Denies Difficulty climbing stairs: Denies Difficulty getting out of chair: Denies Difficulty using hands for taps, buttons, cutlery, and/or writing: Reports  Review of Systems  Constitutional: Negative for fatigue and night sweats.  HENT: Negative for mouth sores, mouth dryness and nose dryness.   Eyes: Negative for pain, redness, itching and dryness.  Respiratory: Negative for cough, hemoptysis, shortness of breath, wheezing and difficulty breathing.   Cardiovascular: Negative for chest pain, palpitations, hypertension, irregular heartbeat and swelling in legs/feet.  Gastrointestinal: Negative for abdominal pain, blood in stool, constipation and diarrhea.  Endocrine:  Negative for increased urination.  Genitourinary: Negative for painful urination.  Musculoskeletal: Positive for arthralgias, joint pain, joint swelling and morning stiffness. Negative for myalgias, muscle weakness, muscle tenderness and myalgias.  Skin: Negative for color change, rash, hair loss, nodules/bumps, redness, skin tightness, ulcers and sensitivity to sunlight.  Allergic/Immunologic: Negative for susceptible to infections.  Neurological: Negative for dizziness, fainting, light-headedness, headaches, memory loss, night sweats and weakness.  Hematological: Negative for swollen glands.  Psychiatric/Behavioral: Negative for depressed mood and sleep disturbance. The patient is not nervous/anxious.     PMFS History:  Patient Active Problem List   Diagnosis Date Noted  . Tear of left rotator cuff 09/24/2017  . Acute pain of left shoulder 03/12/2017  . Rib pain 03/12/2017  . DDD (degenerative disc disease), cervical 10/31/2016  . Primary osteoarthritis of both hands 10/31/2016  . Primary osteoarthritis of both feet 10/31/2016  . High risk medication use 07/06/2016  . History of diabetes mellitus 07/06/2016  . History of coronary artery disease 07/06/2016  . Rheumatoid arthritis involving multiple sites with positive rheumatoid factor ([place]) 06/02/2016  . Other fatigue 06/02/2016  . Contracture, elbow, left 06/02/2016  . Swelling of left hand 05/01/2016  . Osteoarthritis of right knee 08/27/2015  . S/P TKR (total knee replacement), left 08/27/2015  . Essential hypertension 05/11/2015  . Pain in the chest   . Right groin pain 08/15/2012  . Chest pain 04/02/2012  . Cerebrovascular disease 04/02/2012  . CAD (coronary artery disease) 04/02/2012  . Diabetes mellitus without complication (  HCC)   . Dizziness   . Sinus bradycardia   . Hyperlipidemia   . Edema     Past Medical History:  Diagnosis Date  . Anemia   . Arthritis    "all over"  . Bradycardia   . Chronic bronchitis  ([place])    "get it just about q yr" (03/17/2014)  . Daily headache    "here lately" (03/17/2014); relates to sinuses  . GERD (gastroesophageal reflux disease)   . Hard of hearing    hearing aides bilat  . Hiatal hernia   . History of blood transfusion 2008   "related to OR"  . History of kidney stones   . Hyperlipidemia   . Hypertension   . Pneumonia 1972 X 1  . Type II diabetes mellitus ([place])   . Wears glasses     Family History  Problem Relation Age of Onset  . Cancer Father        prostate  . Heart disease Other        No family history   Past Surgical History:  Procedure Laterality Date  . broken finger      surgical repaired left hand 2nd finger   . cyst removed      per left knee/posteriorly  . CYSTECTOMY Left 2009   "behind knee"  . INGUINAL HERNIA REPAIR Right ?2010  . JOINT REPLACEMENT    . KNEE ARTHROSCOPY Bilateral 1980's  . PARTIAL KNEE ARTHROPLASTY Right 08/27/2015   Procedure: RIGHT UNICOMPARTMENTAL KNEE ARTHROPLASTY;  Surgeon: Mcarthur Rossetti, MD;  Location: [facility];  Service: Orthopedics;  Laterality: Right;  . REVISION TOTAL KNEE ARTHROPLASTY Left 2008  . SHOULDER SURGERY Left 2019  . TOTAL KNEE ARTHROPLASTY Left 2003   Social History   Social History Narrative   Patient lives at home with family.   Caffeine Use: occasionally   Immunization History  Administered Date(s) Administered  . Pneumococcal Conjugate-13 01/13/2014     Objective: Vital Signs: BP (!) 151/71 (BP Location: Left Arm, Patient Position: Sitting, Cuff Size: Normal)   Pulse (!) 45   Resp 15   Ht _0  (1.676 m)   Wt 204 lb (92.5 kg)   BMI 32.93 kg/m    Physical Exam Vitals signs and nursing note reviewed.  Constitutional:      Appearance: He is well-developed.  HENT:     Head: Normocephalic and atraumatic.  Eyes:     Conjunctiva/sclera: Conjunctivae normal.     Pupils: Pupils are equal, round, and reactive to light.  Neck:     Musculoskeletal: Normal range of  motion and neck supple.  Cardiovascular:     Rate and Rhythm: Normal rate and regular rhythm.     Heart sounds: Normal heart sounds.  Pulmonary:     Effort: Pulmonary effort is normal.     Breath sounds: Normal breath sounds.  Abdominal:     General: Bowel sounds are normal.     Palpations: Abdomen is soft.  Lymphadenopathy:     Cervical: No cervical adenopathy.  Skin:    General: Skin is warm and dry.     Capillary Refill: Capillary refill takes less than 2 seconds.  Neurological:     Mental Status: He is alert and oriented to person, place, and time.  Psychiatric:        Behavior: Behavior normal.      Musculoskeletal Exam: C-spine limited range of motion with lateral rotation.  Thoracic and lumbar spine good range of motion.  No  midline spinal tenderness.  No SI joint tenderness.  Bilateral shoulders have good range of motion.  He has some discomfort with left shoulder range of motion.  Elbow joints, wrist joints, MCPs, PIPs, DIPs good range of motion with no synovitis.  He has synovial thickening of bilateral PIP and DIP joints.  Hip joints have good range of motion with no discomfort.  No tenderness over trochanter bursa bilaterally.  Right knee is partially replaced and has warmth.  Left knee good range of motion with no warmth or effusion.  No tenderness or swelling of ankle joints.  CDAI Exam: CDAI Score: 0.6  Patient Global Assessment: 3 (mm); Provider Global Assessment: 3 (mm) Swollen: 0 ; Tender: 0  Joint Exam   Not documented   There is currently no information documented on the homunculus. Go to the Rheumatology activity and complete the homunculus joint exam.  Investigation: No additional findings.  Imaging: No results found.  Recent Labs: Lab Results  Component Value Date   WBC 5.2 09/05/2018   HGB 14.1 09/05/2018   PLT 265 09/05/2018   NA 137 09/05/2018   K 3.7 09/05/2018   CL 103 09/05/2018   CO2 25 09/05/2018   GLUCOSE 188 (H) 09/05/2018   BUN 20  09/05/2018   CREATININE 1.08 09/05/2018   BILITOT 1.1 09/05/2018   ALKPHOS 83 09/05/2018   AST 29 09/05/2018   ALT 23 09/05/2018   PROT 6.6 09/05/2018   ALBUMIN 3.8 09/05/2018   CALCIUM 9.0 09/05/2018   GFRAA >60 09/05/2018   QFTBGOLDPLUS Negative 06/13/2018    Speciality Comments: Orencia '750mg'$  every 4 weeks TB gold negative on Jan 2019  Procedures:  No procedures performed Allergies: Codeine; Lipitor [atorvastatin]; Invokana [canagliflozin]; Losartan; Roxicodone [oxycodone]; and Vicodin [hydrocodone-acetaminophen]     Assessment / Plan:     Visit Diagnoses: Rheumatoid arthritis involving multiple sites with positive rheumatoid factor (HCC) - +RF,+antiCCP, history of elevated ESR: He has not had any recent rheumatoid arthritis flares.  He has no synovitis on exam.  He experiences pain and stiffness in both hands which is due to osteoarthritis.  He has PIP and DIP synovial thickening of both hands.  He has no difficulties with ADLs.  He experiences occasional discomfort in his right knee which is partially replaced and in his left shoulder s/p arthroscopic surgery.  He is clinically doing well on Orencia 750 mg IV infusions every 4 weeks.  He will continue on his current treatment regimen.  He was advised to notify us if he develops increased joint pain or joint swelling.  He will follow-up in the office in 5 months.  High risk medication use - Orencia 750 mg IV infusions, Arava discontinue to due to elevated LFTs in February 2019 (inadequate response to Humira in the past).  CBC and CMP were drawn on 09/05/2018.  TB gold was negative on 06/13/2018.  He has lab work drawn with infusions.  Primary osteoarthritis of both hands: He experiences pain and stiffness in bilateral hands due to osteoarthritis.  He experiences morning stiffness lasting 5 to 10 minutes after burning his hands under warm water.  He has PIP and DIP synovial thickening consistent with osteoarthritis of bilateral hands.  He  has complete fist formation bilaterally.  No tenderness or synovitis was noted on exam.  He has no difficulty with ADLs.  Joint protection and muscle strengthening were discussed.  Primary osteoarthritis of both feet: He has no feet pain or joint swelling at this time. He wears proper  fitting shoes.    S/P TKR (total knee replacement), left: Doing well.  He has has good ROM with no discomfort.  No warmth or effusion noted.   Status post right partial knee replacement: Warmth noted.  He has good ROM with some discomfort.   DDD (degenerative disc disease), cervical: He has limited ROM with lateral rotation.  He has no discomfort or symptoms of radiculopathy at this time.   Chronic insomnia: He has been sleeping well at night.  He has occasional left shoulder discomfort at night.    Other fatigue: He has no fatigue at this time.   Other medical conditions are listed as follows:  History of diabetes mellitus - He is on insulin pump.   History of hypertension  History of coronary artery disease  History of hyperlipidemia   Orders: No orders of the defined types were placed in this encounter.  No orders of the defined types were placed in this encounter.   Face-to-face time spent with patient was 25 minutes. Greater than 50% of time was spent in counseling and coordination of care.  Follow-Up Instructions: Return in about 5 months (around 02/25/2019) for Rheumatoid arthritis.   Ofilia Neas, PA-C   I examined and evaluated the patient with Hazel Sams PA.  Patient is clinically doing well with no synovitis on examination.  He will continue IV Orencia.  His labs have been stable which we will continue to monitor.  The plan of care was discussed as noted above.  Bo Merino, MD  Note - This record has been created using Editor, commissioning.  Chart creation errors have been sought, but [place] not always  have been located. Such creation errors do not reflect on  the standard of  medical care.

## 2018-09-25 ENCOUNTER — Other Ambulatory Visit: Payer: Self-pay

## 2018-09-25 ENCOUNTER — Ambulatory Visit (INDEPENDENT_AMBULATORY_CARE_PROVIDER_SITE_OTHER): Payer: Medicare Other | Admitting: Rheumatology

## 2018-09-25 ENCOUNTER — Encounter: Payer: Self-pay | Admitting: Rheumatology

## 2018-09-25 VITALS — BP 151/71 | HR 45 | Resp 15 | Ht 66.0 in | Wt 204.0 lb

## 2018-09-25 DIAGNOSIS — Z8639 Personal history of other endocrine, nutritional and metabolic disease: Secondary | ICD-10-CM | POA: Diagnosis not present

## 2018-09-25 DIAGNOSIS — Z79899 Other long term (current) drug therapy: Secondary | ICD-10-CM | POA: Diagnosis not present

## 2018-09-25 DIAGNOSIS — Z96651 Presence of right artificial knee joint: Secondary | ICD-10-CM | POA: Diagnosis not present

## 2018-09-25 DIAGNOSIS — M19041 Primary osteoarthritis, right hand: Secondary | ICD-10-CM

## 2018-09-25 DIAGNOSIS — F5104 Psychophysiologic insomnia: Secondary | ICD-10-CM | POA: Diagnosis not present

## 2018-09-25 DIAGNOSIS — M19071 Primary osteoarthritis, right ankle and foot: Secondary | ICD-10-CM

## 2018-09-25 DIAGNOSIS — M5030 Other cervical disc degeneration, unspecified cervical region: Secondary | ICD-10-CM

## 2018-09-25 DIAGNOSIS — M0579 Rheumatoid arthritis with rheumatoid factor of multiple sites without organ or systems involvement: Secondary | ICD-10-CM

## 2018-09-25 DIAGNOSIS — Z96652 Presence of left artificial knee joint: Secondary | ICD-10-CM | POA: Diagnosis not present

## 2018-09-25 DIAGNOSIS — R5383 Other fatigue: Secondary | ICD-10-CM | POA: Diagnosis not present

## 2018-09-25 DIAGNOSIS — Z8679 Personal history of other diseases of the circulatory system: Secondary | ICD-10-CM | POA: Diagnosis not present

## 2018-09-25 DIAGNOSIS — M19072 Primary osteoarthritis, left ankle and foot: Secondary | ICD-10-CM

## 2018-09-25 DIAGNOSIS — I2510 Atherosclerotic heart disease of native coronary artery without angina pectoris: Secondary | ICD-10-CM

## 2018-09-25 DIAGNOSIS — M19042 Primary osteoarthritis, left hand: Secondary | ICD-10-CM | POA: Diagnosis not present

## 2018-10-01 ENCOUNTER — Other Ambulatory Visit: Payer: Self-pay | Admitting: *Deleted

## 2018-10-01 DIAGNOSIS — E1122 Type 2 diabetes mellitus with diabetic chronic kidney disease: Secondary | ICD-10-CM | POA: Diagnosis not present

## 2018-10-01 DIAGNOSIS — Z794 Long term (current) use of insulin: Secondary | ICD-10-CM | POA: Diagnosis not present

## 2018-10-01 DIAGNOSIS — I10 Essential (primary) hypertension: Secondary | ICD-10-CM | POA: Diagnosis not present

## 2018-10-01 DIAGNOSIS — N182 Chronic kidney disease, stage 2 (mild): Secondary | ICD-10-CM | POA: Diagnosis not present

## 2018-10-01 NOTE — Progress Notes (Signed)
Infusion orders are current for patient CBC CMP Tylenol Benadryl appointments are up to date and follow up appointment  is scheduled TB gold not due yet.  

## 2018-10-03 ENCOUNTER — Ambulatory Visit (HOSPITAL_COMMUNITY)
Admission: RE | Admit: 2018-10-03 | Discharge: 2018-10-03 | Disposition: A | Discharge status: Home / Self Care | Payer: Medicare Other | Source: Ambulatory Visit | Attending: Rheumatology | Admitting: Rheumatology

## 2018-10-03 ENCOUNTER — Other Ambulatory Visit: Payer: Self-pay

## 2018-10-03 DIAGNOSIS — M0579 Rheumatoid arthritis with rheumatoid factor of multiple sites without organ or systems involvement: Secondary | ICD-10-CM | POA: Insufficient documentation

## 2018-10-03 MED ORDER — SODIUM CHLORIDE 0.9 % IV SOLN
750.0000 mg | INTRAVENOUS | Status: DC
  Administered 2018-10-03: 750 mg via INTRAVENOUS
  Filled 2018-10-03: qty 30

## 2018-10-03 MED ORDER — DIPHENHYDRAMINE HCL 25 MG PO CAPS: 25.0000 mg | ORAL_CAPSULE | ORAL | Status: DC

## 2018-10-03 MED ORDER — ACETAMINOPHEN 325 MG PO TABS: 650.0000 mg | ORAL_TABLET | ORAL | Status: DC

## 2018-10-05 ENCOUNTER — Other Ambulatory Visit: Payer: Self-pay | Admitting: Family Medicine

## 2018-10-23 ENCOUNTER — Other Ambulatory Visit: Payer: Self-pay | Admitting: *Deleted

## 2018-10-23 NOTE — Progress Notes (Signed)
Infusion orders are current for patient CBC CMP Tylenol Benadryl appointments are up to date and follow up appointment  is scheduled TB gold not due yet.  

## 2018-10-30 ENCOUNTER — Other Ambulatory Visit (HOSPITAL_COMMUNITY): Payer: Self-pay | Admitting: *Deleted

## 2018-10-31 ENCOUNTER — Other Ambulatory Visit: Payer: Self-pay

## 2018-10-31 ENCOUNTER — Encounter (HOSPITAL_COMMUNITY)
Admission: RE | Admit: 2018-10-31 | Discharge: 2018-10-31 | Disposition: A | Discharge status: Home / Self Care | Payer: Medicare Other | Source: Ambulatory Visit | Attending: Rheumatology | Admitting: Rheumatology

## 2018-10-31 DIAGNOSIS — M0579 Rheumatoid arthritis with rheumatoid factor of multiple sites without organ or systems involvement: Secondary | ICD-10-CM | POA: Insufficient documentation

## 2018-10-31 LAB — CBC
HCT: 40 % (ref 39.0–52.0)
Hemoglobin: 14 g/dL (ref 13.0–17.0)
MCH: 30.8 pg (ref 26.0–34.0)
MCHC: 35 g/dL (ref 30.0–36.0)
MCV: 88.1 fL (ref 80.0–100.0)
Platelets: 258 10*3/uL (ref 150–400)
RBC: 4.54 MIL/uL (ref 4.22–5.81)
RDW: 12.4 % (ref 11.5–15.5)
WBC: 5.9 10*3/uL (ref 4.0–10.5)
nRBC: 0 % (ref 0.0–0.2)

## 2018-10-31 LAB — COMPREHENSIVE METABOLIC PANEL
ALT: 25 U/L (ref 0–44)
AST: 34 U/L (ref 15–41)
Albumin: 3.8 g/dL (ref 3.5–5.0)
Alkaline Phosphatase: 75 U/L (ref 38–126)
Anion gap: 10 (ref 5–15)
BUN: 21 mg/dL (ref 8–23)
CO2: 26 mmol/L (ref 22–32)
Calcium: 9.4 mg/dL (ref 8.9–10.3)
Chloride: 102 mmol/L (ref 98–111)
Creatinine, Ser: 1.17 mg/dL (ref 0.61–1.24)
GFR calc Af Amer: >60 mL/min (ref >60)
GFR calc non Af Amer: >60 mL/min (ref >60)
Glucose, Bld: 153 mg/dL — ABNORMAL HIGH (ref 70–99)
Potassium: 3.8 mmol/L (ref 3.5–5.1)
Sodium: 138 mmol/L (ref 135–145)
Total Bilirubin: 0.8 mg/dL (ref 0.3–1.2)
Total Protein: 6.5 g/dL (ref 6.5–8.1)

## 2018-10-31 MED ORDER — ACETAMINOPHEN 325 MG PO TABS: 650.0000 mg | ORAL_TABLET | ORAL | Status: DC

## 2018-10-31 MED ORDER — DIPHENHYDRAMINE HCL 25 MG PO CAPS: 25.0000 mg | ORAL_CAPSULE | ORAL | Status: DC

## 2018-10-31 MED ORDER — SODIUM CHLORIDE 0.9 % IV SOLN
750.0000 mg | INTRAVENOUS | Status: DC
  Administered 2018-10-31: 750 mg via INTRAVENOUS
  Filled 2018-10-31: qty 30

## 2018-11-05 DIAGNOSIS — E1122 Type 2 diabetes mellitus with diabetic chronic kidney disease: Secondary | ICD-10-CM | POA: Diagnosis not present

## 2018-11-05 DIAGNOSIS — I10 Essential (primary) hypertension: Secondary | ICD-10-CM | POA: Diagnosis not present

## 2018-11-05 DIAGNOSIS — Z794 Long term (current) use of insulin: Secondary | ICD-10-CM | POA: Diagnosis not present

## 2018-11-05 DIAGNOSIS — K219 Gastro-esophageal reflux disease without esophagitis: Secondary | ICD-10-CM | POA: Diagnosis not present

## 2018-11-05 DIAGNOSIS — N182 Chronic kidney disease, stage 2 (mild): Secondary | ICD-10-CM | POA: Diagnosis not present

## 2018-11-21 ENCOUNTER — Other Ambulatory Visit: Payer: Self-pay | Admitting: *Deleted

## 2018-11-21 DIAGNOSIS — M0579 Rheumatoid arthritis with rheumatoid factor of multiple sites without organ or systems involvement: Secondary | ICD-10-CM

## 2018-11-28 ENCOUNTER — Ambulatory Visit (HOSPITAL_COMMUNITY)
Admission: RE | Admit: 2018-11-28 | Discharge: 2018-11-28 | Disposition: A | Discharge status: Home / Self Care | Payer: Medicare Other | Source: Ambulatory Visit | Attending: Rheumatology | Admitting: Rheumatology

## 2018-11-28 ENCOUNTER — Other Ambulatory Visit: Payer: Self-pay

## 2018-11-28 DIAGNOSIS — M0579 Rheumatoid arthritis with rheumatoid factor of multiple sites without organ or systems involvement: Secondary | ICD-10-CM | POA: Diagnosis not present

## 2018-11-28 MED ORDER — ACETAMINOPHEN 325 MG PO TABS: 650.0000 mg | ORAL_TABLET | ORAL | Status: DC

## 2018-11-28 MED ORDER — DIPHENHYDRAMINE HCL 25 MG PO CAPS: 25.0000 mg | ORAL_CAPSULE | ORAL | Status: DC

## 2018-11-28 MED ORDER — SODIUM CHLORIDE 0.9 % IV SOLN
750.0000 mg | INTRAVENOUS | Status: DC
  Administered 2018-11-28: 750 mg via INTRAVENOUS
  Filled 2018-11-28: qty 30

## 2018-12-13 ENCOUNTER — Other Ambulatory Visit: Payer: Self-pay | Admitting: *Deleted

## 2018-12-13 NOTE — Progress Notes (Signed)
Infusion orders are current for patient CBC CMP Tylenol Benadryl appointments are up to date and follow up appointment  is scheduled TB gold not due yet.  

## 2018-12-26 ENCOUNTER — Other Ambulatory Visit: Payer: Self-pay

## 2018-12-26 ENCOUNTER — Encounter (HOSPITAL_COMMUNITY)
Admission: RE | Admit: 2018-12-26 | Discharge: 2018-12-26 | Disposition: A | Discharge status: Home / Self Care | Payer: Medicare Other | Source: Ambulatory Visit | Attending: Rheumatology | Admitting: Rheumatology

## 2018-12-26 ENCOUNTER — Encounter (HOSPITAL_COMMUNITY): Payer: Medicare Other

## 2018-12-26 DIAGNOSIS — M0579 Rheumatoid arthritis with rheumatoid factor of multiple sites without organ or systems involvement: Secondary | ICD-10-CM | POA: Diagnosis not present

## 2018-12-26 LAB — COMPREHENSIVE METABOLIC PANEL
ALT: 34 U/L (ref 0–44)
AST: 39 U/L (ref 15–41)
Albumin: 3.8 g/dL (ref 3.5–5.0)
Alkaline Phosphatase: 90 U/L (ref 38–126)
Anion gap: 11 (ref 5–15)
BUN: 19 mg/dL (ref 8–23)
CO2: 25 mmol/L (ref 22–32)
Calcium: 8.8 mg/dL — ABNORMAL LOW (ref 8.9–10.3)
Chloride: 100 mmol/L (ref 98–111)
Creatinine, Ser: 1.23 mg/dL (ref 0.61–1.24)
GFR calc Af Amer: >60 mL/min (ref >60)
GFR calc non Af Amer: 58 mL/min — ABNORMAL LOW (ref >60)
Glucose, Bld: 188 mg/dL — ABNORMAL HIGH (ref 70–99)
Potassium: 4.1 mmol/L (ref 3.5–5.1)
Sodium: 136 mmol/L (ref 135–145)
Total Bilirubin: 1.4 mg/dL — ABNORMAL HIGH (ref 0.3–1.2)
Total Protein: 6.3 g/dL — ABNORMAL LOW (ref 6.5–8.1)

## 2018-12-26 LAB — CBC
HCT: 38.9 % — ABNORMAL LOW (ref 39.0–52.0)
Hemoglobin: 13.7 g/dL (ref 13.0–17.0)
MCH: 31.4 pg (ref 26.0–34.0)
MCHC: 35.2 g/dL (ref 30.0–36.0)
MCV: 89.2 fL (ref 80.0–100.0)
Platelets: 313 10*3/uL (ref 150–400)
RBC: 4.36 MIL/uL (ref 4.22–5.81)
RDW: 12.5 % (ref 11.5–15.5)
WBC: 6.7 10*3/uL (ref 4.0–10.5)
nRBC: 0 % (ref 0.0–0.2)

## 2018-12-26 MED ORDER — DIPHENHYDRAMINE HCL 25 MG PO CAPS: 25.0000 mg | ORAL_CAPSULE | ORAL | Status: DC

## 2018-12-26 MED ORDER — SODIUM CHLORIDE 0.9 % IV SOLN
750.0000 mg | INTRAVENOUS | Status: DC
  Administered 2018-12-26: 750 mg via INTRAVENOUS
  Filled 2018-12-26: qty 30

## 2018-12-26 MED ORDER — ACETAMINOPHEN 325 MG PO TABS: 650.0000 mg | ORAL_TABLET | ORAL | Status: DC

## 2018-12-26 NOTE — Progress Notes (Signed)
Decrease in GFR noted.  Most likely due to the use of diuretics and ACE inhibitor  Please forward the labs to his PCP.

## 2019-01-07 DIAGNOSIS — E1122 Type 2 diabetes mellitus with diabetic chronic kidney disease: Secondary | ICD-10-CM | POA: Diagnosis not present

## 2019-01-07 DIAGNOSIS — I10 Essential (primary) hypertension: Secondary | ICD-10-CM | POA: Diagnosis not present

## 2019-01-07 DIAGNOSIS — N182 Chronic kidney disease, stage 2 (mild): Secondary | ICD-10-CM | POA: Diagnosis not present

## 2019-01-07 DIAGNOSIS — Z794 Long term (current) use of insulin: Secondary | ICD-10-CM | POA: Diagnosis not present

## 2019-01-22 ENCOUNTER — Other Ambulatory Visit: Payer: Self-pay | Admitting: *Deleted

## 2019-01-22 NOTE — Progress Notes (Signed)
Infusion orders are current for patient CBC CMP Tylenol Benadryl appointments are up to date and follow up appointment  is scheduled TB gold not due yet.  

## 2019-01-23 ENCOUNTER — Other Ambulatory Visit: Payer: Self-pay

## 2019-01-23 ENCOUNTER — Ambulatory Visit (HOSPITAL_COMMUNITY)
Admission: RE | Admit: 2019-01-23 | Discharge: 2019-01-23 | Disposition: A | Discharge status: Home / Self Care | Payer: Medicare Other | Source: Ambulatory Visit | Attending: Rheumatology | Admitting: Rheumatology

## 2019-01-23 DIAGNOSIS — Z79899 Other long term (current) drug therapy: Secondary | ICD-10-CM | POA: Insufficient documentation

## 2019-01-23 DIAGNOSIS — M0579 Rheumatoid arthritis with rheumatoid factor of multiple sites without organ or systems involvement: Secondary | ICD-10-CM | POA: Diagnosis not present

## 2019-01-23 LAB — LIPID PANEL
Cholesterol: 125 mg/dL (ref 0–200)
HDL: 35 mg/dL — ABNORMAL LOW (ref >40)
LDL Cholesterol: 80 mg/dL (ref 0–99)
Total CHOL/HDL Ratio: 3.6 RATIO
Triglycerides: 49 mg/dL (ref <150)
VLDL: 10 mg/dL (ref 0–40)

## 2019-01-23 MED ORDER — DIPHENHYDRAMINE HCL 25 MG PO CAPS: 25.0000 mg | ORAL_CAPSULE | ORAL | Status: DC

## 2019-01-23 MED ORDER — ACETAMINOPHEN 325 MG PO TABS: 650.0000 mg | ORAL_TABLET | ORAL | Status: DC

## 2019-01-23 MED ORDER — SODIUM CHLORIDE 0.9 % IV SOLN
750.0000 mg | INTRAVENOUS | Status: DC
  Administered 2019-01-23: 750 mg via INTRAVENOUS
  Filled 2019-01-23 (×2): qty 30

## 2019-01-24 LAB — HEMOGLOBIN A1C
Hgb A1c MFr Bld: 6.9 % — ABNORMAL HIGH (ref 4.8–5.6)
Mean Plasma Glucose: 151 mg/dL

## 2019-01-31 DIAGNOSIS — E1122 Type 2 diabetes mellitus with diabetic chronic kidney disease: Secondary | ICD-10-CM | POA: Diagnosis not present

## 2019-01-31 DIAGNOSIS — K219 Gastro-esophageal reflux disease without esophagitis: Secondary | ICD-10-CM | POA: Diagnosis not present

## 2019-01-31 DIAGNOSIS — Z96659 Presence of unspecified artificial knee joint: Secondary | ICD-10-CM | POA: Diagnosis not present

## 2019-01-31 DIAGNOSIS — I10 Essential (primary) hypertension: Secondary | ICD-10-CM | POA: Diagnosis not present

## 2019-01-31 DIAGNOSIS — E7800 Pure hypercholesterolemia, unspecified: Secondary | ICD-10-CM | POA: Diagnosis not present

## 2019-01-31 DIAGNOSIS — Z1389 Encounter for screening for other disorder: Secondary | ICD-10-CM | POA: Diagnosis not present

## 2019-01-31 DIAGNOSIS — Z0000 Encounter for general adult medical examination without abnormal findings: Secondary | ICD-10-CM | POA: Diagnosis not present

## 2019-01-31 DIAGNOSIS — Z125 Encounter for screening for malignant neoplasm of prostate: Secondary | ICD-10-CM | POA: Diagnosis not present

## 2019-01-31 DIAGNOSIS — Z794 Long term (current) use of insulin: Secondary | ICD-10-CM | POA: Diagnosis not present

## 2019-01-31 DIAGNOSIS — Z23 Encounter for immunization: Secondary | ICD-10-CM | POA: Diagnosis not present

## 2019-02-12 NOTE — Progress Notes (Signed)
Office Visit Note  Patient: Joel Mareno Genco Sr.             Date of Birth: [birth year]           MRN: [id number]             PCP: Rennis Golden Referring: Orlena Sheldon, PA-C Visit Date: 02/26/2019 Occupation: _0 @  Subjective:  Medication monitoring     History of Present Illness: Joel Rabenold Raj Sr. is a 73 y.o. male with history of seropositive rheumatoid arthritis and osteoarthritis.  Patient is receiving Orencia 750 mg IV infusions every 28 days.  His last effusion was on 02/20/2019.  He denies any recent rheumatoid arthritis flares.  He states he experiences stiffness in both hands but denies any discomfort or swelling at this time.  He states that his right knee had a partial replacement in the past but he continues to have discomfort and will be scheduling a total knee replacement in summer 2021.  He experiences intermittent discomfort in the left shoulder joint which he previously had arthroscopic surgery on.  He denies any other joint pain or joint swelling at this time.   Activities of Daily Living:  Patient reports morning stiffness for 30 minutes.   Patient Denies nocturnal pain.  Difficulty dressing/grooming: Denies Difficulty climbing stairs: Denies Difficulty getting out of chair: Reports Difficulty using hands for taps, buttons, cutlery, and/or writing: Denies  Review of Systems  Constitutional: Negative for fatigue and night sweats.  HENT: Negative for mouth sores, mouth dryness and nose dryness.   Eyes: Negative for redness, itching and dryness.  Respiratory: Negative for cough, hemoptysis, shortness of breath, wheezing and difficulty breathing.   Cardiovascular: Negative for chest pain, palpitations, hypertension, irregular heartbeat and swelling in legs/feet.  Gastrointestinal: Negative for abdominal pain, blood in stool, constipation and diarrhea.  Endocrine: Negative for increased urination.  Genitourinary: Negative for difficulty urinating and painful  urination.  Musculoskeletal: Positive for arthralgias, joint pain and morning stiffness. Negative for joint swelling, myalgias, muscle weakness, muscle tenderness and myalgias.  Skin: Negative for color change, rash, hair loss, nodules/bumps, skin tightness, ulcers and sensitivity to sunlight.  Allergic/Immunologic: Negative for susceptible to infections.  Neurological: Negative for dizziness, fainting, light-headedness, numbness, headaches, memory loss, night sweats and weakness.  Hematological: Negative for bruising/bleeding tendency and swollen glands.  Psychiatric/Behavioral: Negative for depressed mood, confusion and sleep disturbance. The patient is not nervous/anxious.     PMFS History:  Patient Active Problem List   Diagnosis Date Noted  . Tear of left rotator cuff 09/24/2017  . Acute pain of left shoulder 03/12/2017  . Rib pain 03/12/2017  . DDD (degenerative disc disease), cervical 10/31/2016  . Primary osteoarthritis of both hands 10/31/2016  . Primary osteoarthritis of both feet 10/31/2016  . High risk medication use 07/06/2016  . History of diabetes mellitus 07/06/2016  . History of coronary artery disease 07/06/2016  . Rheumatoid arthritis involving multiple sites with positive rheumatoid factor ([place]) 06/02/2016  . Other fatigue 06/02/2016  . Contracture, elbow, left 06/02/2016  . Swelling of left hand 05/01/2016  . Osteoarthritis of right knee 08/27/2015  . S/P TKR (total knee replacement), left 08/27/2015  . Essential hypertension 05/11/2015  . Pain in the chest   . Right groin pain 08/15/2012  . Chest pain 04/02/2012  . Cerebrovascular disease 04/02/2012  . CAD (coronary artery disease) 04/02/2012  . Diabetes mellitus without complication ([place])   . Dizziness   . Sinus bradycardia   .  Hyperlipidemia   . Edema     Past Medical History:  Diagnosis Date  . Anemia   . Arthritis    "all over"  . Bradycardia   . Chronic bronchitis ([place])    "get it just about q  yr" (03/17/2014)  . Daily headache    "here lately" (03/17/2014); relates to sinuses  . GERD (gastroesophageal reflux disease)   . Hard of hearing    hearing aides bilat  . Hiatal hernia   . History of blood transfusion 2008   "related to OR"  . History of kidney stones   . Hyperlipidemia   . Hypertension   . Pneumonia 1972 X 1  . Type II diabetes mellitus ([place])   . Wears glasses     Family History  Problem Relation Age of Onset  . Cancer Father        prostate  . Heart disease Other        No family history   Past Surgical History:  Procedure Laterality Date  . broken finger      surgical repaired left hand 2nd finger   . cyst removed      per left knee/posteriorly  . CYSTECTOMY Left 2009   "behind knee"  . INGUINAL HERNIA REPAIR Right ?2010  . JOINT REPLACEMENT    . KNEE ARTHROSCOPY Bilateral 1980's  . PARTIAL KNEE ARTHROPLASTY Right 08/27/2015   Procedure: RIGHT UNICOMPARTMENTAL KNEE ARTHROPLASTY;  Surgeon: Mcarthur Rossetti, MD;  Location: [facility];  Service: Orthopedics;  Laterality: Right;  . REVISION TOTAL KNEE ARTHROPLASTY Left 2008  . SHOULDER SURGERY Left 2019  . TOTAL KNEE ARTHROPLASTY Left 2003   Social History   Social History Narrative   Patient lives at home with family.   Caffeine Use: occasionally   Immunization History  Administered Date(s) Administered  . Pneumococcal Conjugate-13 01/13/2014     Objective: Vital Signs: BP 132/73 (BP Location: Left Arm, Patient Position: Sitting, Cuff Size: Normal)   Pulse (!) 41   Resp 14   Ht '5\' 6"'$  (1.676 m)   Wt 207 lb (93.9 kg)   BMI 33.41 kg/m    Physical Exam Vitals signs and nursing note reviewed.  Constitutional:      Appearance: He is well-developed.  HENT:     Head: Normocephalic and atraumatic.  Eyes:     Conjunctiva/sclera: Conjunctivae normal.     Pupils: Pupils are equal, round, and reactive to light.  Neck:     Musculoskeletal: Normal range of motion and neck supple.   Cardiovascular:     Rate and Rhythm: Normal rate and regular rhythm.     Heart sounds: Normal heart sounds.  Pulmonary:     Effort: Pulmonary effort is normal.     Breath sounds: Normal breath sounds.  Abdominal:     General: Bowel sounds are normal.     Palpations: Abdomen is soft.  Skin:    General: Skin is warm and dry.     Capillary Refill: Capillary refill takes less than 2 seconds.  Neurological:     Mental Status: He is alert and oriented to person, place, and time.  Psychiatric:        Behavior: Behavior normal.      Musculoskeletal Exam: C-spine, thoracic spine, lumbar spine good range of motion with no discomfort.  No midline spinal tenderness.  No SI joint tenderness.  Shoulder joints, elbow joints, wrist joints, MCPs, PIPs and DIPs good range of motion no synovitis.  He has synovial  thickening of all MCP joints.  Synovial thickening of PIP and DIP joints consistent with osteoarthritis of both hands.  He has complete fist duration bilaterally.  No active synovitis was noted.  Hip joints have good range of motion no discomfort.  Left knee replacement has good range of motion with no effusion.  Right knee partial replacement has good range of motion with discomfort.  Ankle joints have good range of motion no tenderness or inflammation.  CDAI Exam: CDAI Score: 1  Patient Global: 5 mm; Provider Global: 5 mm Swollen: 0 ; Tender: 0  Joint Exam   No joint exam has been documented for this visit   There is currently no information documented on the homunculus. Go to the Rheumatology activity and complete the homunculus joint exam.  Investigation: No additional findings.  Imaging: No results found.  Recent Labs: Lab Results  Component Value Date   WBC 5.9 02/20/2019   HGB 14.5 02/20/2019   PLT 241 02/20/2019   NA 138 02/20/2019   K 3.8 02/20/2019   CL 106 02/20/2019   CO2 23 02/20/2019   GLUCOSE 208 (H) 02/20/2019   BUN 17 02/20/2019   CREATININE 1.19 02/20/2019    BILITOT 0.6 02/20/2019   ALKPHOS 79 02/20/2019   AST 26 02/20/2019   ALT 24 02/20/2019   PROT 6.2 (L) 02/20/2019   ALBUMIN 3.7 02/20/2019   CALCIUM 8.7 (L) 02/20/2019   GFRAA >60 02/20/2019   QFTBGOLDPLUS Negative 06/13/2018    Speciality Comments: Orencia 792m every 4 weeks TB gold negative on Jan 2019  Procedures:  No procedures performed Allergies: Codeine, Lipitor [atorvastatin], Invokana [canagliflozin], Losartan, Roxicodone [oxycodone], and Vicodin [hydrocodone-acetaminophen]   Assessment / Plan:     Visit Diagnoses: Rheumatoid arthritis involving multiple sites with positive rheumatoid factor (HCC) - +RF,+antiCCP, history of elevated ESR: He has no synovitis on exam.  He has not had any recent rheumatoid arthritis flares.  He is clinically doing well on Orencia 750 mg IV infusions every 4 weeks.  He experiences joint stiffness in both hands but does not have any active inflammation at this time.  He has synovial thickening of all MCP joints but no tenderness on exam.  He will continue on Orencia as prescribed.  He was advised to notify [place] he develops increased joint pain or joint swelling.  He will follow-up in the office in 5 months.  High risk medication use - Orencia 750 mg IV infusions, Arava discontinue to due to elevated LFTs in February 2019 (inadequate response to Humira in the past).  CBC and CMP were stable on 02/20/2019.  TB gold negative on 06/13/2018.  A future order for TB gold was placed today.- Plan: QuantiFERON-TB Gold Plus  Primary osteoarthritis of both hands: He has PIP and DIP synovial thickening consistent with osteoarthritis of both hands.  He has no tenderness or synovitis on exam.  He has complete fist formation bilaterally.  Joint protection and muscle strengthening were discussed.  Primary osteoarthritis of both feet: He has no feet pain or joint swelling at this time.  S/P TKR (total knee replacement), left: Doing well.  Good range of motion with no  discomfort.  No warmth or effusion noted.  Status post right partial knee replacement: Chronic pain.  He will be scheduling a total right knee replacement in the summer 2021 with Dr. BNinfa Linden  DDD (degenerative disc disease), cervical: He has good range of motion with no discomfort.  He has no symptoms of radiculopathy at this  time.  Other fatigue: He has not had any increased fatigue recently.   Chronic insomnia: He has been sleeping well at night.  Other medical conditions are listed as follows:  History of coronary artery disease  History of diabetes mellitus - He is on insulin pump.   History of hyperlipidemia  History of hypertension  Orders: Orders Placed This Encounter  Procedures  . QuantiFERON-TB Gold Plus   No orders of the defined types were placed in this encounter.   Follow-Up Instructions: Return in about 5 months (around 07/27/2019) for Rheumatoid arthritis, Osteoarthritis.   Ofilia Neas, PA-C   I examined and evaluated the patient with Hazel Sams PA.  Patient had no synovitis on my examination today.  He is doing quite well on combination therapy.  We will continue current treatment.  The plan of care was discussed as noted above.  Bo Merino, MD  Note - This record has been created using Editor, commissioning.  Chart creation errors have been sought, but [place] not always  have been located. Such creation errors do not reflect on  the standard of medical care.

## 2019-02-14 ENCOUNTER — Other Ambulatory Visit: Payer: Self-pay | Admitting: *Deleted

## 2019-02-14 NOTE — Progress Notes (Signed)
Infusion orders are current for patient CBC CMP Tylenol Benadryl appointments are up to date and follow up appointment  is scheduled TB gold not due yet.  

## 2019-02-20 ENCOUNTER — Ambulatory Visit (HOSPITAL_COMMUNITY)
Admission: RE | Admit: 2019-02-20 | Discharge: 2019-02-20 | Disposition: A | Discharge status: Home / Self Care | Payer: Medicare Other | Source: Ambulatory Visit | Attending: Rheumatology | Admitting: Rheumatology

## 2019-02-20 ENCOUNTER — Other Ambulatory Visit: Payer: Self-pay

## 2019-02-20 DIAGNOSIS — M0579 Rheumatoid arthritis with rheumatoid factor of multiple sites without organ or systems involvement: Secondary | ICD-10-CM | POA: Diagnosis not present

## 2019-02-20 LAB — CBC
HCT: 41 % (ref 39.0–52.0)
Hemoglobin: 14.5 g/dL (ref 13.0–17.0)
MCH: 32 pg (ref 26.0–34.0)
MCHC: 35.4 g/dL (ref 30.0–36.0)
MCV: 90.5 fL (ref 80.0–100.0)
Platelets: 241 10*3/uL (ref 150–400)
RBC: 4.53 MIL/uL (ref 4.22–5.81)
RDW: 12.6 % (ref 11.5–15.5)
WBC: 5.9 10*3/uL (ref 4.0–10.5)
nRBC: 0 % (ref 0.0–0.2)

## 2019-02-20 LAB — COMPREHENSIVE METABOLIC PANEL
ALT: 24 U/L (ref 0–44)
AST: 26 U/L (ref 15–41)
Albumin: 3.7 g/dL (ref 3.5–5.0)
Alkaline Phosphatase: 79 U/L (ref 38–126)
Anion gap: 9 (ref 5–15)
BUN: 17 mg/dL (ref 8–23)
CO2: 23 mmol/L (ref 22–32)
Calcium: 8.7 mg/dL — ABNORMAL LOW (ref 8.9–10.3)
Chloride: 106 mmol/L (ref 98–111)
Creatinine, Ser: 1.19 mg/dL (ref 0.61–1.24)
GFR calc Af Amer: >60 mL/min (ref >60)
GFR calc non Af Amer: >60 mL/min (ref >60)
Glucose, Bld: 208 mg/dL — ABNORMAL HIGH (ref 70–99)
Potassium: 3.8 mmol/L (ref 3.5–5.1)
Sodium: 138 mmol/L (ref 135–145)
Total Bilirubin: 0.6 mg/dL (ref 0.3–1.2)
Total Protein: 6.2 g/dL — ABNORMAL LOW (ref 6.5–8.1)

## 2019-02-20 MED ORDER — ACETAMINOPHEN 325 MG PO TABS: 650.0000 mg | ORAL_TABLET | ORAL | Status: DC

## 2019-02-20 MED ORDER — DIPHENHYDRAMINE HCL 25 MG PO CAPS: 25.0000 mg | ORAL_CAPSULE | ORAL | Status: DC

## 2019-02-20 MED ORDER — SODIUM CHLORIDE 0.9 % IV SOLN
750.0000 mg | INTRAVENOUS | Status: DC
  Administered 2019-02-20: 750 mg via INTRAVENOUS
  Filled 2019-02-20: qty 30

## 2019-02-20 NOTE — Progress Notes (Signed)
Glucose is mildly elevated.  Labs are stable.

## 2019-02-26 ENCOUNTER — Other Ambulatory Visit: Payer: Self-pay

## 2019-02-26 ENCOUNTER — Ambulatory Visit (INDEPENDENT_AMBULATORY_CARE_PROVIDER_SITE_OTHER): Payer: Medicare Other | Admitting: Rheumatology

## 2019-02-26 ENCOUNTER — Encounter: Payer: Self-pay | Admitting: Rheumatology

## 2019-02-26 VITALS — BP 132/73 | HR 41 | Resp 14 | Ht 66.0 in | Wt 207.0 lb

## 2019-02-26 DIAGNOSIS — Z79899 Other long term (current) drug therapy: Secondary | ICD-10-CM | POA: Diagnosis not present

## 2019-02-26 DIAGNOSIS — Z8639 Personal history of other endocrine, nutritional and metabolic disease: Secondary | ICD-10-CM

## 2019-02-26 DIAGNOSIS — R5383 Other fatigue: Secondary | ICD-10-CM

## 2019-02-26 DIAGNOSIS — F5104 Psychophysiologic insomnia: Secondary | ICD-10-CM

## 2019-02-26 DIAGNOSIS — M19072 Primary osteoarthritis, left ankle and foot: Secondary | ICD-10-CM

## 2019-02-26 DIAGNOSIS — M19042 Primary osteoarthritis, left hand: Secondary | ICD-10-CM | POA: Diagnosis not present

## 2019-02-26 DIAGNOSIS — M5030 Other cervical disc degeneration, unspecified cervical region: Secondary | ICD-10-CM

## 2019-02-26 DIAGNOSIS — I2510 Atherosclerotic heart disease of native coronary artery without angina pectoris: Secondary | ICD-10-CM

## 2019-02-26 DIAGNOSIS — M19071 Primary osteoarthritis, right ankle and foot: Secondary | ICD-10-CM

## 2019-02-26 DIAGNOSIS — Z8679 Personal history of other diseases of the circulatory system: Secondary | ICD-10-CM | POA: Diagnosis not present

## 2019-02-26 DIAGNOSIS — Z96652 Presence of left artificial knee joint: Secondary | ICD-10-CM

## 2019-02-26 DIAGNOSIS — Z96651 Presence of right artificial knee joint: Secondary | ICD-10-CM | POA: Diagnosis not present

## 2019-02-26 DIAGNOSIS — M0579 Rheumatoid arthritis with rheumatoid factor of multiple sites without organ or systems involvement: Secondary | ICD-10-CM | POA: Diagnosis not present

## 2019-02-26 DIAGNOSIS — M19041 Primary osteoarthritis, right hand: Secondary | ICD-10-CM | POA: Diagnosis not present

## 2019-03-17 ENCOUNTER — Other Ambulatory Visit: Payer: Self-pay | Admitting: *Deleted

## 2019-03-17 NOTE — Progress Notes (Signed)
Infusion orders are current for patient CBC CMP Tylenol Benadryl appointments are up to date and follow up appointment  is scheduled TB gold not due yet.  

## 2019-03-20 ENCOUNTER — Ambulatory Visit (HOSPITAL_COMMUNITY)
Admission: RE | Admit: 2019-03-20 | Discharge: 2019-03-20 | Disposition: A | Discharge status: Home / Self Care | Payer: Medicare Other | Source: Ambulatory Visit | Attending: Rheumatology | Admitting: Rheumatology

## 2019-03-20 ENCOUNTER — Other Ambulatory Visit: Payer: Self-pay

## 2019-03-20 DIAGNOSIS — M0579 Rheumatoid arthritis with rheumatoid factor of multiple sites without organ or systems involvement: Secondary | ICD-10-CM | POA: Diagnosis not present

## 2019-03-20 MED ORDER — ACETAMINOPHEN 325 MG PO TABS: 650.0000 mg | ORAL_TABLET | ORAL | Status: DC

## 2019-03-20 MED ORDER — DIPHENHYDRAMINE HCL 25 MG PO CAPS: 25.0000 mg | ORAL_CAPSULE | ORAL | Status: DC

## 2019-03-20 MED ORDER — SODIUM CHLORIDE 0.9 % IV SOLN
750.0000 mg | INTRAVENOUS | Status: DC
  Administered 2019-03-20: 750 mg via INTRAVENOUS
  Filled 2019-03-20: qty 30

## 2019-03-28 DIAGNOSIS — E119 Type 2 diabetes mellitus without complications: Secondary | ICD-10-CM | POA: Diagnosis not present

## 2019-03-28 DIAGNOSIS — H40033 Anatomical narrow angle, bilateral: Secondary | ICD-10-CM | POA: Diagnosis not present

## 2019-04-03 ENCOUNTER — Other Ambulatory Visit: Payer: Self-pay | Admitting: *Deleted

## 2019-04-03 NOTE — Progress Notes (Signed)
Infusion orders are current for patient CBC CMP Tylenol Benadryl appointments are up to date and follow up appointment  is scheduled TB gold not due yet.  

## 2019-04-04 DIAGNOSIS — E1122 Type 2 diabetes mellitus with diabetic chronic kidney disease: Secondary | ICD-10-CM | POA: Diagnosis not present

## 2019-04-04 DIAGNOSIS — N182 Chronic kidney disease, stage 2 (mild): Secondary | ICD-10-CM | POA: Diagnosis not present

## 2019-04-04 DIAGNOSIS — I10 Essential (primary) hypertension: Secondary | ICD-10-CM | POA: Diagnosis not present

## 2019-04-04 DIAGNOSIS — Z794 Long term (current) use of insulin: Secondary | ICD-10-CM | POA: Diagnosis not present

## 2019-04-17 ENCOUNTER — Encounter (HOSPITAL_COMMUNITY): Payer: Medicare Other

## 2019-04-18 ENCOUNTER — Other Ambulatory Visit: Payer: Self-pay

## 2019-04-18 ENCOUNTER — Ambulatory Visit (HOSPITAL_COMMUNITY)
Admission: RE | Admit: 2019-04-18 | Discharge: 2019-04-18 | Disposition: A | Discharge status: Home / Self Care | Payer: Medicare Other | Source: Ambulatory Visit | Attending: Rheumatology | Admitting: Rheumatology

## 2019-04-18 DIAGNOSIS — M0579 Rheumatoid arthritis with rheumatoid factor of multiple sites without organ or systems involvement: Secondary | ICD-10-CM | POA: Insufficient documentation

## 2019-04-18 LAB — COMPREHENSIVE METABOLIC PANEL
ALT: 24 U/L (ref 0–44)
AST: 35 U/L (ref 15–41)
Albumin: 3.7 g/dL (ref 3.5–5.0)
Alkaline Phosphatase: 81 U/L (ref 38–126)
Anion gap: 8 (ref 5–15)
BUN: 18 mg/dL (ref 8–23)
CO2: 23 mmol/L (ref 22–32)
Calcium: 8.8 mg/dL — ABNORMAL LOW (ref 8.9–10.3)
Chloride: 109 mmol/L (ref 98–111)
Creatinine, Ser: 1.21 mg/dL (ref 0.61–1.24)
GFR calc Af Amer: >60 mL/min (ref >60)
GFR calc non Af Amer: 59 mL/min — ABNORMAL LOW (ref >60)
Glucose, Bld: 142 mg/dL — ABNORMAL HIGH (ref 70–99)
Potassium: 4.1 mmol/L (ref 3.5–5.1)
Sodium: 140 mmol/L (ref 135–145)
Total Bilirubin: 0.6 mg/dL (ref 0.3–1.2)
Total Protein: 5.9 g/dL — ABNORMAL LOW (ref 6.5–8.1)

## 2019-04-18 LAB — CBC
HCT: 40.8 % (ref 39.0–52.0)
Hemoglobin: 14.1 g/dL (ref 13.0–17.0)
MCH: 30.9 pg (ref 26.0–34.0)
MCHC: 34.6 g/dL (ref 30.0–36.0)
MCV: 89.3 fL (ref 80.0–100.0)
Platelets: 231 10*3/uL (ref 150–400)
RBC: 4.57 MIL/uL (ref 4.22–5.81)
RDW: 12.9 % (ref 11.5–15.5)
WBC: 5 10*3/uL (ref 4.0–10.5)
nRBC: 0 % (ref 0.0–0.2)

## 2019-04-18 MED ORDER — ACETAMINOPHEN 325 MG PO TABS: 650.0000 mg | ORAL_TABLET | ORAL | Status: DC

## 2019-04-18 MED ORDER — SODIUM CHLORIDE 0.9 % IV SOLN
750.0000 mg | INTRAVENOUS | Status: DC
  Administered 2019-04-18: 750 mg via INTRAVENOUS
  Filled 2019-04-18: qty 30

## 2019-04-18 MED ORDER — DIPHENHYDRAMINE HCL 25 MG PO CAPS: 25.0000 mg | ORAL_CAPSULE | ORAL | Status: DC

## 2019-04-18 NOTE — Progress Notes (Signed)
GFR is low but is stable.  CBC is normal.  Glucose is mildly elevated.

## 2019-05-07 NOTE — Progress Notes (Signed)
Cardiology Office Note   Date:  05/08/2019   ID:  Joel Boston Proch Sr., DOB 09-Dec-1945, MRN [id number]  PCP:  Orlena Sheldon, PA-C  Cardiologist:  Lubertha South CC: Follow Up   History of Present Illness: Joel Benzon Basey Sr. is a 73 y.o. male who presents for ongoing assessment and management of nonobstructive CAD, nuclear medicine study 2015 showed ejection fraction of 63% with no evidence of ischemia or infarction.  Patient had carotid Dopplers in October 2017 which revealed minimal amount of plaque bilaterally.  Echocardiogram in February 2019 revealed normal LV function mild diastolic dysfunction and mild left atrial enlargement.  He has other history of type 2 diabetes, hypertension, bradycardia, and degenerative disc disease of the cervical spine.  Patient comes today without any cardiac complaints.  He is medically compliant.  He is keeping his blood glucose under control.  He is medically compliant.  He is being followed closely by PCP with labs.  He remains active.   Past Medical History:  Diagnosis Date   Anemia    Arthritis    "all over"   Bradycardia    Chronic bronchitis ([place])    "get it just about q yr" (03/17/2014)   Daily headache    "here lately" (03/17/2014); relates to sinuses   GERD (gastroesophageal reflux disease)    Hard of hearing    hearing aides bilat   Hiatal hernia    History of blood transfusion 2008   "related to OR"   History of kidney stones    Hyperlipidemia    Hypertension    Pneumonia 1972 X 1   Type II diabetes mellitus ([place])    Wears glasses     Past Surgical History:  Procedure Laterality Date   broken finger      surgical repaired left hand 2nd finger    cyst removed      per left knee/posteriorly   CYSTECTOMY Left 2009   "behind knee"   INGUINAL HERNIA REPAIR Right ?2010   JOINT REPLACEMENT     KNEE ARTHROSCOPY Bilateral 1980's   PARTIAL KNEE ARTHROPLASTY Right 08/27/2015   Procedure: RIGHT UNICOMPARTMENTAL KNEE  ARTHROPLASTY;  Surgeon: Mcarthur Rossetti, MD;  Location: [facility];  Service: Orthopedics;  Laterality: Right;   REVISION TOTAL KNEE ARTHROPLASTY Left 2008   SHOULDER SURGERY Left 2019   TOTAL KNEE ARTHROPLASTY Left 2003     Current Outpatient Medications  Medication Sig Dispense Refill   Abatacept (ORENCIA IV) Inject into the vein.     amLODipine (NORVASC) 5 MG tablet Take 1 tablet (5 mg total) by mouth daily. 90 tablet 3   Ascorbic Acid (VITAMIN C PO) Take by mouth daily.     aspirin EC 81 MG tablet Take 81 mg by mouth daily.     Cholecalciferol (VITAMIN D3 PO) Take by mouth daily.     HUMALOG 100 UNIT/ML injection INJ UP TO 65 UNI [place] VIA INSULIN PUMP QD     insulin aspart (NOVOLOG FLEXPEN) 100 UNIT/ML FlexPen Inject 1 Units into the skin 3 (three) times daily with meals. 1 unit per hour     KRILL OIL 1000 MG CAPS Take 1,000 mg by mouth daily.     Lancets (ONETOUCH ULTRASOFT) lancets FOR USE WHEN CHECKING BLOOD SUGAR QID.  3   Misc Natural Products (TART CHERRY ADVANCED PO) Take by mouth daily.     Multiple Vitamin (MULTIVITAMIN WITH MINERALS) TABS Take 1 tablet by mouth daily.     ONE TOUCH ULTRA TEST  test strip USE WHEN CHECKING BLOOD SUGAR QID.  3   pantoprazole (PROTONIX) 40 MG tablet TAKE 1 TABLET DAILY. NEED OFFICE VISIT AND LABS BEFORE FURTHER REFILLS 90 tablet 0   POTASSIUM PO Take by mouth daily.     pravastatin (PRAVACHOL) 40 MG tablet TAKE 1 TABLET EVERY EVENING 90 tablet 3   Pyridoxine HCl (VITAMIN B-6 PO) Take by mouth daily.     ramipril (ALTACE) 5 MG capsule TAKE 1 CAPSULE EVERY DAY 90 capsule 3   TURMERIC PO Take by mouth daily.     vitamin B-12 (CYANOCOBALAMIN) 1000 MCG tablet Take 1,000 mcg by mouth daily.     vitamin E 400 UNIT capsule Take 400 Units by mouth daily.     No current facility-administered medications for this visit.    Allergies:   Codeine, Lipitor [atorvastatin], Invokana [canagliflozin], Losartan, Roxicodone [oxycodone],  and Vicodin [hydrocodone-acetaminophen]    Social History:  The patient  reports that he quit smoking about 51 years ago. His smoking use included cigarettes and cigars. He has a 5.00 pack-year smoking history. He has never used smokeless tobacco. He reports that he does not drink alcohol or use drugs.   Family History:  The patient's family history includes Cancer in his father; Heart disease in an other family member.    ROS: All other systems are reviewed and negative. Unless otherwise mentioned in H&P    PHYSICAL EXAM: VS:  BP 134/71    Pulse (!) 43    Ht 5\' 6"  (1.676 m)    Wt 209 lb 3.2 oz (94.9 kg)    SpO2 96%    BMI 33.77 kg/m  , BMI Body mass index is 33.77 kg/m. GEN: Well nourished, well developed, in no acute distress HEENT: normal Neck: no JVD, carotid bruits, or masses Cardiac: RRR; 2/6 crescendo/decrescedo  Murmur heard best at the right sternal border with radiation into the right carotid artery, rubs, or gallops,no edema  Respiratory:  Clear to auscultation bilaterally, normal work of breathing GI: soft, nontender, nondistended, + BS MS: no deformity or atrophy Skin: warm and dry, no rash Neuro:  Strength and sensation are intact Psych: euthymic mood, full affect   EKG: Sinus bradycardia heart rate of 43 bpm.  Recent Labs: 04/18/2019: ALT 24; BUN 18; Creatinine, Ser 1.21; Hemoglobin 14.1; Platelets 231; Potassium 4.1; Sodium 140    Lipid Panel    Component Value Date/Time   CHOL 125 01/23/2019 0811   TRIG 49 01/23/2019 0811   HDL 35 (L) 01/23/2019 0811   CHOLHDL 3.6 01/23/2019 0811   VLDL 10 01/23/2019 0811   LDLCALC 80 01/23/2019 0811      Wt Readings from Last 3 Encounters:  05/08/19 209 lb 3.2 oz (94.9 kg)  03/20/19 207 lb (93.9 kg)  02/26/19 207 lb (93.9 kg)      Other studies Reviewed: Echocardiogram 07-15-17 Left ventricle: The cavity size was normal. Wall thickness was   increased in a pattern of mild LVH. Systolic function was normal.    The estimated ejection fraction was in the range of 60% to 65%.   Wall motion was normal; there were no regional wall motion   abnormalities. Doppler parameters are consistent with abnormal   left ventricular relaxation (grade 1 diastolic dysfunction). - Aortic valve: There was no stenosis. - Mitral valve: There was no significant regurgitation. - Left atrium: The atrium was mildly dilated. - Right ventricle: The cavity size was normal. Systolic function   was normal. - Tricuspid valve:  Peak RV-RA gradient (S): 31 mm Hg. - Pulmonary arteries: PA peak pressure: 34 mm Hg (S). - Inferior vena cava: The vessel was normal in size. The   respirophasic diameter changes were in the normal range (= 50%),   consistent with normal central venous pressure.   ASSESSMENT AND PLAN:  1.  Nonobstructive CAD: The patient is completely asymptomatic for chest pain dyspnea on exertion or fatigue.  He has bradycardic and not on AV nodal blocking agents at this time.  He is asymptomatic with this.  We will continue to monitor for symptoms.  2.  Hypertension: Remains well controlled on amlodipine, ramipril, and low-sodium diet.  Labs are completed by PCP.  3.  Hyperlipidemia: Remains on pravastatin 40 mg daily and krill oil.  Follow-up labs per PCP.  4.  Insulin-dependent diabetes: Followed by PCP.   Current medicines are reviewed at length with the patient today.    Labs/ tests ordered today include: None  Joel Myron. [place], ANP, AACC   05/08/2019 10:35 AM    [address] Office [phone] Fax [phone]  Notice: This dictation was prepared with Dragon dictation along with smaller phrase technology. Any transcriptional errors that result from this process are unintentional and [place] not be corrected upon review.

## 2019-05-08 ENCOUNTER — Other Ambulatory Visit: Payer: Self-pay

## 2019-05-08 ENCOUNTER — Encounter: Payer: Self-pay | Admitting: Adult Health

## 2019-05-08 ENCOUNTER — Ambulatory Visit (INDEPENDENT_AMBULATORY_CARE_PROVIDER_SITE_OTHER): Payer: Medicare Other | Admitting: Adult Health

## 2019-05-08 VITALS — BP 134/71 | HR 43 | Ht 66.0 in | Wt 209.2 lb

## 2019-05-08 DIAGNOSIS — Z79899 Other long term (current) drug therapy: Secondary | ICD-10-CM

## 2019-05-08 DIAGNOSIS — E785 Hyperlipidemia, unspecified: Secondary | ICD-10-CM

## 2019-05-08 DIAGNOSIS — I2510 Atherosclerotic heart disease of native coronary artery without angina pectoris: Secondary | ICD-10-CM | POA: Diagnosis not present

## 2019-05-08 DIAGNOSIS — I10 Essential (primary) hypertension: Secondary | ICD-10-CM

## 2019-05-08 NOTE — Patient Instructions (Signed)
Medication Instructions:  Continue current medications  If you need a refill on your cardiac medications before your next appointment, please call your pharmacy.  Labwork: CBC, BMP, Fasting Lipid and Liver HERE IN OUR OFFICE AT [facility]   You will need to fast. DO NOT EAT OR DRINK PAST MIDNIGHT.      If you have labs (blood work) drawn today and your tests are completely normal, you will receive your results only by: . MyChart Message (if you have MyChart) OR . A paper copy in the mail If you have any lab test that is abnormal or we need to change your treatment, we will call you to review the results.  Testing/Procedures: None Ordered  PLEASE READ AND FOLLOW SALTY 6 ATTACHED  Reduce your risk of getting COVID-19 With your heart disease it is especially important for people at increased risk of severe illness from COVID-19, and those who live with them, to protect themselves from getting COVID-19. The best way to protect yourself and to help reduce the spread of the virus that causes COVID-19 is to: . Limit your interactions with other people as much as possible. . Take precautions to prevent getting COVID-19 when you do interact with others. If you start feeling sick and think you [place] have COVID-19, get in touch with your healthcare provider within 24 hours.  Follow-Up: IN 6 months Please call our office 2 months in advance, to schedule this appointment. In Person Brian Crenshaw, MD.    At [facility], you and your health needs are our priority.  As part of our continuing mission to provide you with exceptional heart care, we have created designated Provider Care Teams.  These Care Teams include your primary Cardiologist (physician) and Advanced Practice Providers (APPs -  Physician Assistants and Nurse Practitioners) who all work together to provide you with the care you need, when you need it.  Thank you for choosing [facility] at [facility]!!     Happy  Holidays!!        

## 2019-05-19 ENCOUNTER — Other Ambulatory Visit: Payer: Self-pay

## 2019-05-19 ENCOUNTER — Encounter (HOSPITAL_COMMUNITY): Payer: Medicare Other

## 2019-05-19 ENCOUNTER — Encounter (HOSPITAL_COMMUNITY)
Admission: RE | Admit: 2019-05-19 | Discharge: 2019-05-19 | Disposition: A | Discharge status: Home / Self Care | Payer: Medicare Other | Source: Ambulatory Visit | Attending: Rheumatology | Admitting: Rheumatology

## 2019-05-19 DIAGNOSIS — M0579 Rheumatoid arthritis with rheumatoid factor of multiple sites without organ or systems involvement: Secondary | ICD-10-CM | POA: Diagnosis not present

## 2019-05-19 MED ORDER — DIPHENHYDRAMINE HCL 25 MG PO CAPS: 25.0000 mg | ORAL_CAPSULE | ORAL | Status: DC

## 2019-05-19 MED ORDER — ACETAMINOPHEN 325 MG PO TABS: 650.0000 mg | ORAL_TABLET | ORAL | Status: DC

## 2019-05-19 MED ORDER — SODIUM CHLORIDE 0.9 % IV SOLN
750.0000 mg | INTRAVENOUS | Status: DC
  Administered 2019-05-19: 750 mg via INTRAVENOUS
  Filled 2019-05-19: qty 30

## 2019-05-20 LAB — LIPID PANEL
Chol/HDL Ratio: 2.9 ratio (ref 0.0–5.0)
Cholesterol, Total: 120 mg/dL (ref 100–199)
HDL: 41 mg/dL (ref >39)
LDL Chol Calc (NIH): 69 mg/dL (ref 0–99)
Triglycerides: 41 mg/dL (ref 0–149)
VLDL Cholesterol Cal: 10 mg/dL (ref 5–40)

## 2019-05-20 LAB — HEPATIC FUNCTION PANEL
ALT: 21 IU/L (ref 0–44)
AST: 22 IU/L (ref 0–40)
Albumin: 4.3 g/dL (ref 3.7–4.7)
Alkaline Phosphatase: 102 IU/L (ref 39–117)
Bilirubin Total: 0.6 mg/dL (ref 0.0–1.2)
Bilirubin, Direct: 0.19 mg/dL (ref 0.00–0.40)
Total Protein: 6.1 g/dL (ref 6.0–8.5)

## 2019-05-20 LAB — BASIC METABOLIC PANEL
BUN/Creatinine Ratio: 13 (ref 10–24)
BUN: 14 mg/dL (ref 8–27)
CO2: 24 mmol/L (ref 20–29)
Calcium: 8.9 mg/dL (ref 8.6–10.2)
Chloride: 103 mmol/L (ref 96–106)
Creatinine, Ser: 1.06 mg/dL (ref 0.76–1.27)
GFR calc Af Amer: 81 mL/min/{1.73_m2} (ref >59)
GFR calc non Af Amer: 70 mL/min/{1.73_m2} (ref >59)
Glucose: 134 mg/dL — ABNORMAL HIGH (ref 65–99)
Potassium: 4.3 mmol/L (ref 3.5–5.2)
Sodium: 137 mmol/L (ref 134–144)

## 2019-05-20 LAB — CBC
Hematocrit: 40.4 % (ref 37.5–51.0)
Hemoglobin: 13.8 g/dL (ref 13.0–17.7)
MCH: 31.1 pg (ref 26.6–33.0)
MCHC: 34.2 g/dL (ref 31.5–35.7)
MCV: 91 fL (ref 79–97)
Platelets: 233 10*3/uL (ref 150–450)
RBC: 4.44 x10E6/uL (ref 4.14–5.80)
RDW: 12.6 % (ref 11.6–15.4)
WBC: 5.8 10*3/uL (ref 3.4–10.8)

## 2019-06-12 ENCOUNTER — Other Ambulatory Visit: Payer: Self-pay | Admitting: *Deleted

## 2019-06-12 NOTE — Progress Notes (Signed)
Infusion orders are current for patient CBC CMP Tylenol Benadryl appointments are up to date and follow up appointment  is scheduled TB gold due and order placed.  

## 2019-06-16 ENCOUNTER — Other Ambulatory Visit: Payer: Self-pay

## 2019-06-16 ENCOUNTER — Ambulatory Visit (HOSPITAL_COMMUNITY)
Admission: RE | Admit: 2019-06-16 | Discharge: 2019-06-16 | Disposition: A | Discharge status: Home / Self Care | Payer: Medicare Other | Source: Ambulatory Visit | Attending: Rheumatology | Admitting: Rheumatology

## 2019-06-16 DIAGNOSIS — M0579 Rheumatoid arthritis with rheumatoid factor of multiple sites without organ or systems involvement: Secondary | ICD-10-CM | POA: Insufficient documentation

## 2019-06-16 MED ORDER — ACETAMINOPHEN 325 MG PO TABS: 650.0000 mg | ORAL_TABLET | ORAL | Status: DC

## 2019-06-16 MED ORDER — DIPHENHYDRAMINE HCL 25 MG PO CAPS: 25.0000 mg | ORAL_CAPSULE | ORAL | Status: DC

## 2019-06-16 MED ORDER — SODIUM CHLORIDE 0.9 % IV SOLN
750.0000 mg | INTRAVENOUS | Status: DC
  Administered 2019-06-16: 750 mg via INTRAVENOUS
  Filled 2019-06-16: qty 30

## 2019-06-17 LAB — QUANTIFERON-TB GOLD PLUS (RQFGPL)
QuantiFERON Mitogen Value: >10 IU/mL
QuantiFERON Nil Value: 0.07 IU/mL
QuantiFERON TB1 Ag Value: 0.21 IU/mL
QuantiFERON TB2 Ag Value: 0.11 IU/mL

## 2019-06-17 LAB — QUANTIFERON-TB GOLD PLUS: QuantiFERON-TB Gold Plus: NEGATIVE

## 2019-06-24 ENCOUNTER — Other Ambulatory Visit: Payer: Self-pay | Admitting: *Deleted

## 2019-06-24 NOTE — Progress Notes (Signed)
Infusion orders are current for patient CBC CMP Tylenol Benadryl appointments are up to date and follow up appointment  is scheduled TB gold not due yet.  

## 2019-07-14 ENCOUNTER — Ambulatory Visit (HOSPITAL_COMMUNITY)
Admission: RE | Admit: 2019-07-14 | Discharge: 2019-07-14 | Disposition: A | Discharge status: Home / Self Care | Payer: Medicare Other | Source: Ambulatory Visit | Attending: Rheumatology | Admitting: Rheumatology

## 2019-07-14 ENCOUNTER — Other Ambulatory Visit: Payer: Self-pay

## 2019-07-14 DIAGNOSIS — M0579 Rheumatoid arthritis with rheumatoid factor of multiple sites without organ or systems involvement: Secondary | ICD-10-CM | POA: Diagnosis not present

## 2019-07-14 LAB — COMPREHENSIVE METABOLIC PANEL
ALT: 22 U/L (ref 0–44)
AST: 30 U/L (ref 15–41)
Albumin: 3.7 g/dL (ref 3.5–5.0)
Alkaline Phosphatase: 78 U/L (ref 38–126)
Anion gap: 9 (ref 5–15)
BUN: 11 mg/dL (ref 8–23)
CO2: 25 mmol/L (ref 22–32)
Calcium: 8.6 mg/dL — ABNORMAL LOW (ref 8.9–10.3)
Chloride: 105 mmol/L (ref 98–111)
Creatinine, Ser: 1.06 mg/dL (ref 0.61–1.24)
GFR calc Af Amer: >60 mL/min (ref >60)
GFR calc non Af Amer: >60 mL/min (ref >60)
Glucose, Bld: 147 mg/dL — ABNORMAL HIGH (ref 70–99)
Potassium: 3.6 mmol/L (ref 3.5–5.1)
Sodium: 139 mmol/L (ref 135–145)
Total Bilirubin: 0.8 mg/dL (ref 0.3–1.2)
Total Protein: 6.1 g/dL — ABNORMAL LOW (ref 6.5–8.1)

## 2019-07-14 LAB — CBC
HCT: 41.1 % (ref 39.0–52.0)
Hemoglobin: 14.1 g/dL (ref 13.0–17.0)
MCH: 31.2 pg (ref 26.0–34.0)
MCHC: 34.3 g/dL (ref 30.0–36.0)
MCV: 90.9 fL (ref 80.0–100.0)
Platelets: 260 10*3/uL (ref 150–400)
RBC: 4.52 MIL/uL (ref 4.22–5.81)
RDW: 12.9 % (ref 11.5–15.5)
WBC: 5.2 10*3/uL (ref 4.0–10.5)
nRBC: 0 % (ref 0.0–0.2)

## 2019-07-14 MED ORDER — DIPHENHYDRAMINE HCL 25 MG PO CAPS: 25.0000 mg | ORAL_CAPSULE | ORAL | Status: DC

## 2019-07-14 MED ORDER — SODIUM CHLORIDE 0.9 % IV SOLN
750.0000 mg | INTRAVENOUS | Status: DC
  Administered 2019-07-14: 750 mg via INTRAVENOUS
  Filled 2019-07-14: qty 30

## 2019-07-14 MED ORDER — ACETAMINOPHEN 325 MG PO TABS: 650.0000 mg | ORAL_TABLET | ORAL | Status: DC

## 2019-07-14 NOTE — Progress Notes (Signed)
CBC is normal.  Glucose 147 and calcium is low.  Please advise patient to take calcium rich diet and supplement calcium as needed, no more than 1200 mg a day.

## 2019-07-21 NOTE — Progress Notes (Signed)
Office Visit Note  Patient: Joel Chizek Nakatani Sr.             Date of Birth: 08-11-1945           MRN: [id number]             PCP: Rennis Golden Referring: Orlena Sheldon, PA-C Visit Date: 07/29/2019 Occupation: _0 @  Subjective:  Right CMC joint pain  History of Present Illness: Joel Prokop [place] Sr. is a 74 y.o. male with history of seropositive rheumatoid arthritis and osteoarthritis.  Patient is on Orencia 750 mg IV infusions every 28 days.  He has not missed any infusions recently.  He has not had any recent rheumatoid arthritis flares.  He denies any recent infections and is apprehensive to receive the COVID-19 vaccination.  He has been experiencing intermittent discomfort in the right CMC joint but denies any joint swelling.  He experiences stiffness in both hands but is not had any inflammation.  He reports his left total knee replacement and right partial knee replacement are doing well.  He denies any other joint pain or joint swelling at this time.   Activities of Daily Living:  Patient reports morning stiffness for several hours.   Patient Reports nocturnal pain.  Difficulty dressing/grooming: Denies Difficulty climbing stairs: Denies Difficulty getting out of chair: Denies Difficulty using hands for taps, buttons, cutlery, and/or writing: Denies  Review of Systems  Constitutional: Negative for fatigue and night sweats.  HENT: Negative for mouth sores, mouth dryness and nose dryness.   Eyes: Negative for redness, itching and dryness.  Respiratory: Negative for shortness of breath and difficulty breathing.   Cardiovascular: Negative for chest pain, palpitations, hypertension, irregular heartbeat and swelling in legs/feet.  Gastrointestinal: Negative for blood in stool, constipation and diarrhea.  Endocrine: Negative for increased urination.  Genitourinary: Negative for difficulty urinating and painful urination.  Musculoskeletal: Positive for arthralgias, joint pain,  joint swelling and morning stiffness. Negative for myalgias, muscle weakness, muscle tenderness and myalgias.  Skin: Negative for color change, rash, hair loss, nodules/bumps, skin tightness, ulcers and sensitivity to sunlight.  Allergic/Immunologic: Negative for susceptible to infections.  Neurological: Negative for dizziness, fainting, numbness, headaches, memory loss, night sweats and weakness.  Hematological: Negative for bruising/bleeding tendency and swollen glands.  Psychiatric/Behavioral: Negative for depressed mood, confusion and sleep disturbance. The patient is not nervous/anxious.     PMFS History:  Patient Active Problem List   Diagnosis Date Noted  . Tear of left rotator cuff 09/24/2017  . Acute pain of left shoulder 03/12/2017  . Rib pain 03/12/2017  . DDD (degenerative disc disease), cervical 10/31/2016  . Primary osteoarthritis of both hands 10/31/2016  . Primary osteoarthritis of both feet 10/31/2016  . High risk medication use 07/06/2016  . History of diabetes mellitus 07/06/2016  . History of coronary artery disease 07/06/2016  . Rheumatoid arthritis involving multiple sites with positive rheumatoid factor ([place]) 06/02/2016  . Other fatigue 06/02/2016  . Contracture, elbow, left 06/02/2016  . Swelling of left hand 05/01/2016  . Osteoarthritis of right knee 08/27/2015  . S/P TKR (total knee replacement), left 08/27/2015  . Essential hypertension 05/11/2015  . Pain in the chest   . Right groin pain 08/15/2012  . Chest pain 04/02/2012  . Cerebrovascular disease 04/02/2012  . CAD (coronary artery disease) 04/02/2012  . Diabetes mellitus without complication ([place])   . Dizziness   . Sinus bradycardia   . Hyperlipidemia   . Edema  Past Medical History:  Diagnosis Date  . Anemia   . Arthritis    "all over"  . Bradycardia   . Chronic bronchitis ([place])    "get it just about q yr" (03/17/2014)  . Daily headache    "here lately" (03/17/2014); relates to sinuses    . GERD (gastroesophageal reflux disease)   . Hard of hearing    hearing aides bilat  . Hiatal hernia   . History of blood transfusion 2008   "related to OR"  . History of kidney stones   . Hyperlipidemia   . Hypertension   . Pneumonia 1972 X 1  . Type II diabetes mellitus ([place])   . Wears glasses     Family History  Problem Relation Age of Onset  . Cancer Father        prostate   Past Surgical History:  Procedure Laterality Date  . broken finger      surgical repaired left hand 2nd finger   . cyst removed      per left knee/posteriorly  . CYSTECTOMY Left 2009   "behind knee"  . INGUINAL HERNIA REPAIR Right ?2010  . JOINT REPLACEMENT    . KNEE ARTHROSCOPY Bilateral 1980's  . PARTIAL KNEE ARTHROPLASTY Right 08/27/2015   Procedure: RIGHT UNICOMPARTMENTAL KNEE ARTHROPLASTY;  Surgeon: Mcarthur Rossetti, MD;  Location: [facility];  Service: Orthopedics;  Laterality: Right;  . REVISION TOTAL KNEE ARTHROPLASTY Left 2008  . SHOULDER SURGERY Left 2019  . TOTAL KNEE ARTHROPLASTY Left 2003   Social History   Social History Narrative   Patient lives at home with family.   Caffeine Use: occasionally   Immunization History  Administered Date(s) Administered  . Pneumococcal Conjugate-13 01/13/2014     Objective: Vital Signs: BP (!) 152/62 (BP Location: Left Arm, Patient Position: Sitting, Cuff Size: Normal)   Pulse (!) 41   Resp 15   Ht 5' 6" (1.676 m)   Wt 211 lb (95.7 kg)   BMI 34.06 kg/m    Physical Exam Vitals and nursing note reviewed.  Constitutional:      Appearance: He is well-developed.  HENT:     Head: Normocephalic and atraumatic.  Eyes:     Conjunctiva/sclera: Conjunctivae normal.     Pupils: Pupils are equal, round, and reactive to light.  Pulmonary:     Effort: Pulmonary effort is normal.  Abdominal:     General: Bowel sounds are normal.     Palpations: Abdomen is soft.  Musculoskeletal:     Cervical back: Normal range of motion and neck supple.   Skin:    General: Skin is warm and dry.     Capillary Refill: Capillary refill takes less than 2 seconds.  Neurological:     Mental Status: He is alert and oriented to person, place, and time.  Psychiatric:        Behavior: Behavior normal.      Musculoskeletal Exam: C-spine, thoracic spine, lumbar spine good range of motion.  No midline spinal tenderness.  No SI joint tenderness.  Shoulder joints, elbow joints, wrist joints, MCPs, PIPs and DIPs good range of motion with no synovitis.  He has PIP and DIP thickening consistent with osteoarthritis of both hands.  Synovial thickening of all MCP joints but no tenderness or synovitis was noted.  He has tenderness of the right CMC joint.  Hip joints have good range of motion with no discomfort.  Left total knee replacement has good range of motion with no warmth  or effusion.  Right knee partial placement is good range of motion.  Ankle joints have good range of motion no tenderness or effusion.   CDAI Exam: CDAI Score: 0.2  Patient Global: 1 mm; Provider Global: 1 mm Swollen: 0 ; Tender: 1  Joint Exam 07/29/2019      Right  Left  CMC   Tender        Investigation: No additional findings.  Imaging: No results found.  Recent Labs: Lab Results  Component Value Date   WBC 5.2 07/14/2019   HGB 14.1 07/14/2019   PLT 260 07/14/2019   NA 139 07/14/2019   K 3.6 07/14/2019   CL 105 07/14/2019   CO2 25 07/14/2019   GLUCOSE 147 (H) 07/14/2019   BUN 11 07/14/2019   CREATININE 1.06 07/14/2019   BILITOT 0.8 07/14/2019   ALKPHOS 78 07/14/2019   AST 30 07/14/2019   ALT 22 07/14/2019   PROT 6.1 (L) 07/14/2019   ALBUMIN 3.7 07/14/2019   CALCIUM 8.6 (L) 07/14/2019   GFRAA >60 07/14/2019   QFTBGOLDPLUS Negative 06/16/2019    Speciality Comments: Orencia 737m every 4 weeks TB gold negative on Jan 2019  Procedures:  No procedures performed Allergies: Codeine, Lipitor [atorvastatin], Invokana [canagliflozin], Losartan, Roxicodone  [oxycodone], and Vicodin [hydrocodone-acetaminophen]   Assessment / Plan:     Visit Diagnoses: Rheumatoid arthritis involving multiple sites with positive rheumatoid factor (HCC) - +RF,+antiCCP, history of elevated ESR: He has no synovitis on exam.  He has not had any recent rheumatoid arthritis flares.  He is clinically doing well on Orencia 750 mg IV infusions every 28 days.  He has not missed any doses of Orencia recently.  He has not had any recent infections.  He has been experiencing discomfort in the right [facility] due to underlying osteoarthritis.  He has severe PIP and DIP thickening consistent with osteoarthritis of both hands.  He experiences stiffness in both hands but has not had any joint inflammation.  He has not had any other joint pain or joint swelling recently.  He will continue on Orencia IV infusions as monotherapy.  He was advised to notify [place] he develops increased joint pain or joint swelling.  He will follow-up in the office in 5 months.  High risk medication use - Orencia 750 mg IV infusions, Arava discontinue to due to elevated LFTs in February 2019 (inadequate response to Humira in the past).  CBC and CMP were drawn on 07/14/2019.  Calcium was low at that time and he started taking calcium and vitamin D supplement.  He will continue to have CBC and CMP drawn with his infusions.  TB gold was negative on 06/16/2019.  Primary osteoarthritis of both hands: He has PIP and DIP thickening consistent with osteoarthritis of both hands.  He has right CMC joint thickening and tenderness on exam.  He has complete fist formation bilaterally.  He has not had any joint synovitis.  Joint protection and muscle strengthening were discussed.  Primary osteoarthritis of both feet: He has no discomfort in his feet at this time.  He walks for exercise on a daily basis.  S/P TKR (total knee replacement), left: Doing well.  He has good range of motion with no discomfort at this time.  Status post  right partial knee replacement - He will be scheduling a total right knee replacement in the summer 2021 with Dr. BNinfa Linden  He has good range of motion with no discomfort at this time.  DDD (degenerative  disc disease), cervical: He has good range of motion with no discomfort.  No symptoms of radiculopathy at this time.   Other fatigue: Chronic but stable  Chronic insomnia: He has been sleeping well at night.  Other medical conditions are listed as follows:  History of diabetes mellitus - He is on insulin pump.   History of coronary artery disease  History of hypertension  History of hyperlipidemia  Orders: No orders of the defined types were placed in this encounter.  No orders of the defined types were placed in this encounter.     Follow-Up Instructions: Return in about 5 months (around 12/29/2019) for Rheumatoid arthritis, Osteoarthritis.   Ofilia Neas, PA-C   I examined and evaluated the patient with Hazel Sams PA.  Patient's rheumatoid arthritis is well controlled.  He had no synovitis on my examination today.  The plan of care was discussed as noted above.  Bo Merino, MD  Note - This record has been created using Editor, commissioning.  Chart creation errors have been sought, but [place] not always  have been located. Such creation errors do not reflect on  the standard of medical care.

## 2019-07-25 ENCOUNTER — Other Ambulatory Visit: Payer: Self-pay | Admitting: *Deleted

## 2019-07-25 NOTE — Progress Notes (Signed)
Infusion orders are current for patient CBC CMP Tylenol Benadryl appointments are up to date and follow up appointment  is scheduled TB gold not due yet.  

## 2019-07-29 ENCOUNTER — Encounter: Payer: Self-pay | Admitting: Rheumatology

## 2019-07-29 ENCOUNTER — Ambulatory Visit (INDEPENDENT_AMBULATORY_CARE_PROVIDER_SITE_OTHER): Payer: Medicare Other | Admitting: Rheumatology

## 2019-07-29 ENCOUNTER — Other Ambulatory Visit: Payer: Self-pay

## 2019-07-29 VITALS — BP 152/62 | HR 41 | Resp 15 | Ht 66.0 in | Wt 211.0 lb

## 2019-07-29 DIAGNOSIS — M19072 Primary osteoarthritis, left ankle and foot: Secondary | ICD-10-CM

## 2019-07-29 DIAGNOSIS — M19071 Primary osteoarthritis, right ankle and foot: Secondary | ICD-10-CM

## 2019-07-29 DIAGNOSIS — Z96652 Presence of left artificial knee joint: Secondary | ICD-10-CM | POA: Diagnosis not present

## 2019-07-29 DIAGNOSIS — M19042 Primary osteoarthritis, left hand: Secondary | ICD-10-CM

## 2019-07-29 DIAGNOSIS — R5383 Other fatigue: Secondary | ICD-10-CM | POA: Diagnosis not present

## 2019-07-29 DIAGNOSIS — F5104 Psychophysiologic insomnia: Secondary | ICD-10-CM

## 2019-07-29 DIAGNOSIS — Z8639 Personal history of other endocrine, nutritional and metabolic disease: Secondary | ICD-10-CM | POA: Diagnosis not present

## 2019-07-29 DIAGNOSIS — Z79899 Other long term (current) drug therapy: Secondary | ICD-10-CM

## 2019-07-29 DIAGNOSIS — M5030 Other cervical disc degeneration, unspecified cervical region: Secondary | ICD-10-CM

## 2019-07-29 DIAGNOSIS — M0579 Rheumatoid arthritis with rheumatoid factor of multiple sites without organ or systems involvement: Secondary | ICD-10-CM | POA: Diagnosis not present

## 2019-07-29 DIAGNOSIS — Z96651 Presence of right artificial knee joint: Secondary | ICD-10-CM | POA: Diagnosis not present

## 2019-07-29 DIAGNOSIS — Z8679 Personal history of other diseases of the circulatory system: Secondary | ICD-10-CM

## 2019-07-29 DIAGNOSIS — M19041 Primary osteoarthritis, right hand: Secondary | ICD-10-CM

## 2019-08-05 DIAGNOSIS — N182 Chronic kidney disease, stage 2 (mild): Secondary | ICD-10-CM | POA: Diagnosis not present

## 2019-08-05 DIAGNOSIS — I10 Essential (primary) hypertension: Secondary | ICD-10-CM | POA: Diagnosis not present

## 2019-08-05 DIAGNOSIS — E1122 Type 2 diabetes mellitus with diabetic chronic kidney disease: Secondary | ICD-10-CM | POA: Diagnosis not present

## 2019-08-05 DIAGNOSIS — R001 Bradycardia, unspecified: Secondary | ICD-10-CM | POA: Diagnosis not present

## 2019-08-05 DIAGNOSIS — Z794 Long term (current) use of insulin: Secondary | ICD-10-CM | POA: Diagnosis not present

## 2019-08-11 ENCOUNTER — Ambulatory Visit (HOSPITAL_COMMUNITY)
Admission: RE | Admit: 2019-08-11 | Discharge: 2019-08-11 | Disposition: A | Discharge status: Home / Self Care | Payer: Medicare Other | Source: Ambulatory Visit | Attending: Rheumatology | Admitting: Rheumatology

## 2019-08-11 ENCOUNTER — Other Ambulatory Visit: Payer: Self-pay

## 2019-08-11 DIAGNOSIS — M0579 Rheumatoid arthritis with rheumatoid factor of multiple sites without organ or systems involvement: Secondary | ICD-10-CM | POA: Diagnosis not present

## 2019-08-11 MED ORDER — ACETAMINOPHEN 325 MG PO TABS: 650.0000 mg | ORAL_TABLET | ORAL | Status: DC

## 2019-08-11 MED ORDER — SODIUM CHLORIDE 0.9 % IV SOLN
750.0000 mg | INTRAVENOUS | Status: DC
  Administered 2019-08-11: 750 mg via INTRAVENOUS
  Filled 2019-08-11: qty 30

## 2019-08-11 MED ORDER — DIPHENHYDRAMINE HCL 25 MG PO CAPS: 25.0000 mg | ORAL_CAPSULE | ORAL | Status: DC

## 2019-09-08 ENCOUNTER — Other Ambulatory Visit: Payer: Self-pay

## 2019-09-08 ENCOUNTER — Ambulatory Visit (HOSPITAL_COMMUNITY)
Admission: RE | Admit: 2019-09-08 | Discharge: 2019-09-08 | Disposition: A | Discharge status: Home / Self Care | Payer: Medicare Other | Source: Ambulatory Visit | Attending: Rheumatology | Admitting: Rheumatology

## 2019-09-08 DIAGNOSIS — M0579 Rheumatoid arthritis with rheumatoid factor of multiple sites without organ or systems involvement: Secondary | ICD-10-CM | POA: Insufficient documentation

## 2019-09-08 MED ORDER — DIPHENHYDRAMINE HCL 25 MG PO CAPS: 25.0000 mg | ORAL_CAPSULE | ORAL | Status: DC

## 2019-09-08 MED ORDER — ACETAMINOPHEN 325 MG PO TABS: 650.0000 mg | ORAL_TABLET | ORAL | Status: DC

## 2019-09-08 MED ORDER — SODIUM CHLORIDE 0.9 % IV SOLN
750.0000 mg | INTRAVENOUS | Status: DC
  Administered 2019-09-08: 750 mg via INTRAVENOUS
  Filled 2019-09-08: qty 30

## 2019-09-19 ENCOUNTER — Other Ambulatory Visit: Payer: Self-pay | Admitting: *Deleted

## 2019-09-19 NOTE — Progress Notes (Signed)
Infusion orders are current for patient CBC CMP Tylenol Benadryl appointments are up to date and follow up appointment  is scheduled TB gold not due yet.  

## 2019-10-06 ENCOUNTER — Ambulatory Visit (HOSPITAL_COMMUNITY)
Admission: RE | Admit: 2019-10-06 | Discharge: 2019-10-06 | Disposition: A | Discharge status: Home / Self Care | Payer: Medicare Other | Source: Ambulatory Visit | Attending: Rheumatology | Admitting: Rheumatology

## 2019-10-06 ENCOUNTER — Other Ambulatory Visit: Payer: Self-pay

## 2019-10-06 DIAGNOSIS — M0579 Rheumatoid arthritis with rheumatoid factor of multiple sites without organ or systems involvement: Secondary | ICD-10-CM | POA: Insufficient documentation

## 2019-10-06 LAB — CBC
HCT: 39.6 % (ref 39.0–52.0)
Hemoglobin: 13.3 g/dL (ref 13.0–17.0)
MCH: 30.4 pg (ref 26.0–34.0)
MCHC: 33.6 g/dL (ref 30.0–36.0)
MCV: 90.6 fL (ref 80.0–100.0)
Platelets: 200 10*3/uL (ref 150–400)
RBC: 4.37 MIL/uL (ref 4.22–5.81)
RDW: 12.8 % (ref 11.5–15.5)
WBC: 5 10*3/uL (ref 4.0–10.5)
nRBC: 0 % (ref 0.0–0.2)

## 2019-10-06 LAB — COMPREHENSIVE METABOLIC PANEL
ALT: 28 U/L (ref 0–44)
AST: 50 U/L — ABNORMAL HIGH (ref 15–41)
Albumin: 3.7 g/dL (ref 3.5–5.0)
Alkaline Phosphatase: 80 U/L (ref 38–126)
Anion gap: 8 (ref 5–15)
BUN: 17 mg/dL (ref 8–23)
CO2: 24 mmol/L (ref 22–32)
Calcium: 8.5 mg/dL — ABNORMAL LOW (ref 8.9–10.3)
Chloride: 104 mmol/L (ref 98–111)
Creatinine, Ser: 1.18 mg/dL (ref 0.61–1.24)
GFR calc Af Amer: >60 mL/min (ref >60)
GFR calc non Af Amer: >60 mL/min (ref >60)
Glucose, Bld: 249 mg/dL — ABNORMAL HIGH (ref 70–99)
Potassium: 4 mmol/L (ref 3.5–5.1)
Sodium: 136 mmol/L (ref 135–145)
Total Bilirubin: 1 mg/dL (ref 0.3–1.2)
Total Protein: 5.9 g/dL — ABNORMAL LOW (ref 6.5–8.1)

## 2019-10-06 MED ORDER — ACETAMINOPHEN 325 MG PO TABS: 650.0000 mg | ORAL_TABLET | ORAL | Status: DC

## 2019-10-06 MED ORDER — SODIUM CHLORIDE 0.9 % IV SOLN
750.0000 mg | INTRAVENOUS | Status: DC
  Administered 2019-10-06: 750 mg via INTRAVENOUS
  Filled 2019-10-06: qty 30

## 2019-10-06 MED ORDER — DIPHENHYDRAMINE HCL 25 MG PO CAPS: 25.0000 mg | ORAL_CAPSULE | ORAL | Status: DC

## 2019-10-06 NOTE — Progress Notes (Signed)
Glucose is elevated.  AST is elevated.  He is only on abatacept by [place] which should not affect his AST.  Please advise him to avoid all NSAIDs, alcohol and Tylenol.  Please forward labs to his PCP.

## 2019-11-03 ENCOUNTER — Other Ambulatory Visit: Payer: Self-pay

## 2019-11-03 ENCOUNTER — Ambulatory Visit (HOSPITAL_COMMUNITY)
Admission: RE | Admit: 2019-11-03 | Discharge: 2019-11-03 | Disposition: A | Discharge status: Home / Self Care | Payer: Medicare Other | Source: Ambulatory Visit | Attending: Rheumatology | Admitting: Rheumatology

## 2019-11-03 DIAGNOSIS — M0579 Rheumatoid arthritis with rheumatoid factor of multiple sites without organ or systems involvement: Secondary | ICD-10-CM | POA: Insufficient documentation

## 2019-11-03 MED ORDER — SODIUM CHLORIDE 0.9 % IV SOLN
750.0000 mg | INTRAVENOUS | Status: DC
  Administered 2019-11-03: 750 mg via INTRAVENOUS
  Filled 2019-11-03: qty 30

## 2019-11-03 MED ORDER — ACETAMINOPHEN 325 MG PO TABS: 650.0000 mg | ORAL_TABLET | ORAL | Status: DC

## 2019-11-03 MED ORDER — DIPHENHYDRAMINE HCL 25 MG PO CAPS: 25.0000 mg | ORAL_CAPSULE | ORAL | Status: DC

## 2019-11-20 ENCOUNTER — Encounter: Payer: Self-pay | Admitting: Cardiology

## 2019-11-20 ENCOUNTER — Telehealth (INDEPENDENT_AMBULATORY_CARE_PROVIDER_SITE_OTHER): Payer: Medicare Other | Admitting: Cardiology

## 2019-11-20 ENCOUNTER — Telehealth: Payer: Self-pay | Admitting: Radiology

## 2019-11-20 VITALS — Ht 66.0 in | Wt 205.0 lb

## 2019-11-20 DIAGNOSIS — I495 Sick sinus syndrome: Secondary | ICD-10-CM

## 2019-11-20 DIAGNOSIS — R001 Bradycardia, unspecified: Secondary | ICD-10-CM | POA: Diagnosis not present

## 2019-11-20 NOTE — Patient Instructions (Addendum)
Medication Instructions:  Your physician recommends that you continue on your current medications as directed. Please refer to the Current Medication list given to you today.  *If you need a refill on your cardiac medications before your next appointment, please call your pharmacy*  Lab Work: NONE ordered at this time of appointment   If you have labs (blood work) drawn today and your tests are completely normal, you will receive your results only by: Marland Kitchen MyChart Message (if you have MyChart) OR . A paper copy in the mail If you have any lab test that is abnormal or we need to change your treatment, we will call you to review the results.  Testing/Procedures: NONE ordered at this time of appointment   Your next appointment:   1 year(s)  The format for your next appointment:   In Person  Provider:   Kirk Ruths, MD  Other Instructions

## 2019-11-20 NOTE — Progress Notes (Signed)
Virtual Visit via Telephone Note   This visit type was conducted due to national recommendations for restrictions regarding the COVID-19 Pandemic (e.g. social distancing) in an effort to limit this patient's exposure and mitigate transmission in our community.  Due to his co-morbid illnesses, this patient is at least at moderate risk for complications without adequate follow up.  This format is felt to be most appropriate for this patient at this time.  The patient did not have access to video technology/had technical difficulties with video requiring transitioning to audio format only (telephone).  All issues noted in this document were discussed and addressed.  No physical exam could be performed with this format.  Please refer to the patient's chart for his  consent to telehealth for [facility].   The patient was identified using 2 identifiers.  Date:  11/20/2019   ID:  Joel Boston Lefkowitz Sr., DOB Dec 02, 1945, MRN [id number]  Patient Location: Home Provider Location: Home  PCP:  Orlena Sheldon, PA-C  Cardiologist:  Kirk Ruths, MD  Electrophysiologist:  None   Evaluation Performed:  Follow-Up Visit  Chief Complaint:  none  History of Present Illness:    Joel Patchin Disbrow Sr. is a 74 y.o. male with a history of insulin-dependent diabetes, hypertension, dyslipidemia, and rheumatoid arthritis.  He has had a remote Myoview that was normal in 2004.  He is not had chest pain.  He was contacted for routine follow-up.  Since we saw him last he denies any chest pain or unusual dyspnea.  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  He sees Dr. Inez Pilgrim for arthritis and he says this is doing well.  His last functional study was an echocardiogram in February 2019 that showed an ejection fraction of 60 to 65%. He has chronic bradycardia "since I was 4".  He denies any syncope, pre syncope, or unusual weakness.   The patient does not  have symptoms concerning for COVID-19 infection (fever, chills, cough, or new shortness of breath).    Past Medical History:  Diagnosis Date  . Anemia   . Arthritis    "all over"  . Bradycardia   . Chronic bronchitis ([place])    "get it just about q yr" (03/17/2014)  . Daily headache    "here lately" (03/17/2014); relates to sinuses  . GERD (gastroesophageal reflux disease)   . Hard of hearing    hearing aides bilat  . Hiatal hernia   . History of blood transfusion 2008   "related to OR"  . History of kidney stones   . Hyperlipidemia   . Hypertension   . Pneumonia 1972 X 1  . Type II diabetes mellitus ([place])   . Wears glasses    Past Surgical History:  Procedure Laterality Date  . broken finger      surgical repaired left hand 2nd finger   . cyst removed      per left knee/posteriorly  . CYSTECTOMY Left 2009   "behind knee"  . INGUINAL HERNIA REPAIR Right ?2010  . JOINT REPLACEMENT    . KNEE ARTHROSCOPY Bilateral 1980's  . PARTIAL KNEE ARTHROPLASTY Right 08/27/2015   Procedure: RIGHT UNICOMPARTMENTAL KNEE ARTHROPLASTY;  Surgeon: Mcarthur Rossetti, MD;  Location: [facility];  Service: Orthopedics;  Laterality: Right;  . REVISION TOTAL KNEE ARTHROPLASTY Left 2008  . SHOULDER SURGERY Left 2019  . TOTAL KNEE ARTHROPLASTY Left 2003     Current Meds  Medication Sig  . amLODipine (NORVASC) 5 MG tablet Take 1 tablet (5 mg total) by mouth daily.  . Ascorbic Acid (VITAMIN C PO) Take by mouth daily.  Marland Kitchen aspirin EC 81 MG tablet Take 81 mg by mouth daily.  . Calcium Carbonate-Vit D-Min (CALCIUM 1200 PO) Take by mouth daily.  . Cholecalciferol (VITAMIN D3 PO) Take by mouth daily.  Marland Kitchen HUMALOG 100 UNIT/ML injection INJ UP TO 65 UNI Edwards VIA INSULIN PUMP QD  . insulin aspart (NOVOLOG FLEXPEN) 100 UNIT/ML FlexPen Inject 1 Units into the skin 3 (three) times daily with meals. 1 unit per hour  . KRILL OIL 1000 MG CAPS Take 1,000 mg by mouth daily.  . Lancets (ONETOUCH ULTRASOFT) lancets FOR  USE WHEN CHECKING BLOOD SUGAR QID.  Marland Kitchen loratadine (CLARITIN) 10 MG tablet Take 10 mg by mouth daily.  . Misc Natural Products (TART CHERRY ADVANCED PO) Take by mouth daily.  . Multiple Vitamin (MULTIVITAMIN WITH MINERALS) TABS Take 1 tablet by mouth daily.  . ONE TOUCH ULTRA TEST test strip USE WHEN CHECKING BLOOD SUGAR QID.  Marland Kitchen pantoprazole (PROTONIX) 40 MG tablet TAKE 1 TABLET DAILY. NEED OFFICE VISIT AND LABS BEFORE FURTHER REFILLS  . POTASSIUM PO Take by mouth daily.  . pravastatin (PRAVACHOL) 40 MG tablet TAKE 1 TABLET EVERY EVENING  . Pyridoxine HCl (VITAMIN B-6 PO) Take by mouth daily.  . ramipril (ALTACE) 5 MG capsule TAKE 1 CAPSULE EVERY DAY  . vitamin B-12 (CYANOCOBALAMIN) 1000 MCG tablet Take 1,000 mcg by mouth daily.  . vitamin E 400 UNIT capsule Take 400 Units by mouth daily.     Allergies:   Codeine, Lipitor [atorvastatin], Invokana [canagliflozin], Losartan, Roxicodone [oxycodone], and Vicodin [hydrocodone-acetaminophen]   Social History   Tobacco Use  . Smoking status: Former Smoker    Packs/day: 1.00    Years: 5.00    Pack years: 5.00    Types: Cigarettes, Cigars    Quit date: 05/15/1968    Years since quitting: 51.5  . Smokeless tobacco: Never Used  Vaping Use  . Vaping Use: Never used  Substance Use Topics  . Alcohol use: No    Alcohol/week: 0.0 standard drinks  . Drug use: Never     Family Hx: The patient's family history includes Cancer in his father.  ROS:   Please see the history of present illness.    All other systems reviewed and are negative.   Prior CV studies:   The following studies were reviewed today:  Echo Feb 2019  Labs/Other Tests and Data Reviewed:    EKG:  An ECG dated 05/08/2019 was personally reviewed today and demonstrated:  NSR-SB 45  Recent Labs: 10/06/2019: ALT 28; BUN 17; Creatinine, Ser 1.18; Hemoglobin 13.3; Platelets 200; Potassium 4.0; Sodium 136   Recent Lipid Panel Lab Results  Component Value Date/Time   CHOL 120  05/20/2019 11:02 AM   TRIG 41 05/20/2019 11:02 AM   HDL 41 05/20/2019 11:02 AM   CHOLHDL 2.9 05/20/2019 11:02 AM   CHOLHDL 3.6 01/23/2019 08:11 AM   LDLCALC 69 05/20/2019 11:02 AM    Wt Readings from Last 3 Encounters:  11/20/19 205 lb (93 kg)  11/03/19 205 lb (93 kg)  10/06/19 200 lb (90.7 kg)     Objective:    Vital Signs:  Ht 5\' 6"  (1.676 m)   Wt 205 lb (93 kg)   BMI 33.09 kg/m    VITAL SIGNS:  reviewed  ASSESSMENT & PLAN:    Bradycardia- Long standing  bradycardia- this appears to be asymptomatic.   IDDM- Followed by PCP  Essential HTN- Controlled  HLD- LDL 69 in Jan 2021- AST 50 in [place] 2021  RA- Dr Estanislado Pandy follows.  Plan:  I am a little concerned about his bradycardia though this does appear to be chronic and apparently asymptomatic.   I suggested a 7 day ZIO but the patient didn't feel this was necessary.   COVID-19 Education: The signs and symptoms of COVID-19 were discussed with the patient and how to seek care for testing (follow up with PCP or arrange E-visit).  The importance of social distancing was discussed today.  Time:   Today, I have spent 15 minutes with the patient with telehealth technology discussing the above problems.     Medication Adjustments/Labs and Tests Ordered: Current medicines are reviewed at length with the patient today.  Concerns regarding medicines are outlined above.   Tests Ordered: No orders of the defined types were placed in this encounter.   Medication Changes: No orders of the defined types were placed in this encounter.   Follow Up:  In Person one year  Signed, Susy Manor  11/20/2019 10:36 AM    [place]

## 2019-11-20 NOTE — Telephone Encounter (Addendum)
error 

## 2019-12-01 ENCOUNTER — Other Ambulatory Visit: Payer: Self-pay

## 2019-12-01 ENCOUNTER — Ambulatory Visit (HOSPITAL_COMMUNITY)
Admission: RE | Admit: 2019-12-01 | Discharge: 2019-12-01 | Disposition: A | Discharge status: Home / Self Care | Payer: Medicare Other | Source: Ambulatory Visit | Attending: Rheumatology | Admitting: Rheumatology

## 2019-12-01 DIAGNOSIS — M0579 Rheumatoid arthritis with rheumatoid factor of multiple sites without organ or systems involvement: Secondary | ICD-10-CM | POA: Insufficient documentation

## 2019-12-01 LAB — CBC
HCT: 40.3 % (ref 39.0–52.0)
Hemoglobin: 13.3 g/dL (ref 13.0–17.0)
MCH: 30.2 pg (ref 26.0–34.0)
MCHC: 33 g/dL (ref 30.0–36.0)
MCV: 91.6 fL (ref 80.0–100.0)
Platelets: 264 10*3/uL (ref 150–400)
RBC: 4.4 MIL/uL (ref 4.22–5.81)
RDW: 12.8 % (ref 11.5–15.5)
WBC: 6.2 10*3/uL (ref 4.0–10.5)
nRBC: 0 % (ref 0.0–0.2)

## 2019-12-01 LAB — COMPREHENSIVE METABOLIC PANEL
ALT: 30 U/L (ref 0–44)
AST: 28 U/L (ref 15–41)
Albumin: 3.7 g/dL (ref 3.5–5.0)
Alkaline Phosphatase: 91 U/L (ref 38–126)
Anion gap: 8 (ref 5–15)
BUN: 12 mg/dL (ref 8–23)
CO2: 26 mmol/L (ref 22–32)
Calcium: 8.9 mg/dL (ref 8.9–10.3)
Chloride: 106 mmol/L (ref 98–111)
Creatinine, Ser: 1.04 mg/dL (ref 0.61–1.24)
GFR calc Af Amer: >60 mL/min (ref >60)
GFR calc non Af Amer: >60 mL/min (ref >60)
Glucose, Bld: 85 mg/dL (ref 70–99)
Potassium: 3.7 mmol/L (ref 3.5–5.1)
Sodium: 140 mmol/L (ref 135–145)
Total Bilirubin: 0.8 mg/dL (ref 0.3–1.2)
Total Protein: 6.1 g/dL — ABNORMAL LOW (ref 6.5–8.1)

## 2019-12-01 MED ORDER — SODIUM CHLORIDE 0.9 % IV SOLN
750.0000 mg | INTRAVENOUS | Status: DC
  Administered 2019-12-01: 750 mg via INTRAVENOUS
  Filled 2019-12-01: qty 30

## 2019-12-01 MED ORDER — DIPHENHYDRAMINE HCL 25 MG PO CAPS: 25.0000 mg | ORAL_CAPSULE | ORAL | Status: DC

## 2019-12-01 MED ORDER — ACETAMINOPHEN 325 MG PO TABS: 650.0000 mg | ORAL_TABLET | ORAL | Status: DC

## 2019-12-10 ENCOUNTER — Other Ambulatory Visit: Payer: Self-pay | Admitting: Pharmacist

## 2019-12-10 DIAGNOSIS — M0579 Rheumatoid arthritis with rheumatoid factor of multiple sites without organ or systems involvement: Secondary | ICD-10-CM

## 2019-12-10 NOTE — Progress Notes (Signed)
Patient due for updated orders for Orencia infusion. CBC/CMP within normal limits on 12/01/2019. Last TB gold negative on 06/16/2019.  Orders placed for IV Orencia every 28 days x 3 doses along with premedication of Tylenol and Benadryl. Standing orders placed for CBC/CMP every 2 months x 5.    Due for TB gold in February 2022. Next infusion scheduled for August 16.  Mariella Saa, PharmD, [place], CPP Clinical Specialty Pharmacist (Rheumatology and Pulmonology)  12/10/2019 9:31 AM

## 2019-12-15 NOTE — Progress Notes (Signed)
Office Visit Note  Patient: Joel Kintz Labarre Sr.             Date of Birth: 1945-09-05           MRN: [id number]             PCP: Rennis Golden Referring: Orlena Sheldon, PA-C Visit Date: 12/29/2019 Occupation: '@GUAROCC' @  Subjective:  Left hand pain   History of Present Illness: Joel Strine Kloth Sr. is a 74 y.o. male with history of seropositive rheumatoid arthritis and osteoarthritis.  Joel Jones is on Orencia IV infusions every 28 days.  Joel Jones is due for his infusion today.  Joel Jones reports that about 7 to 10 days prior to his infusion Joel Jones experiences increased discomfort and stiffness in his left hand.  Joel Jones is having difficulty making a complete fist today.  Joel Jones states that Joel Jones notices intermittent swelling in his left hand.  Joel Jones continues to have ongoing pain in the right knee which she states needs to be fully replaced but Joel Jones is putting off surgery at this time. Joel Jones has not received the COVID-19 vaccination.  Joel Jones has not had any recent infections.   Activities of Daily Living:  Patient reports joint stiffness all day  Patient Reports nocturnal pain.  Difficulty dressing/grooming: Denies Difficulty climbing stairs: Denies Difficulty getting out of chair: Denies Difficulty using hands for taps, buttons, cutlery, and/or writing: Denies  Review of Systems  Constitutional: Positive for fatigue.  HENT: Negative for mouth sores, mouth dryness and nose dryness.   Eyes: Negative for itching and dryness.  Respiratory: Negative for shortness of breath and difficulty breathing.   Cardiovascular: Negative for chest pain and palpitations.  Gastrointestinal: Negative for blood in stool, constipation and diarrhea.  Endocrine: Negative for increased urination.  Genitourinary: Negative for difficulty urinating.  Musculoskeletal: Positive for arthralgias, joint pain, joint swelling and morning stiffness. Negative for myalgias, muscle tenderness and myalgias.  Skin: Negative for color change, rash and redness.    Allergic/Immunologic: Negative for susceptible to infections.  Neurological: Negative for dizziness, numbness, headaches, memory loss and weakness.  Hematological: Negative for bruising/bleeding tendency.  Psychiatric/Behavioral: Negative for confusion.    PMFS History:  Patient Active Problem List   Diagnosis Date Noted  . Tear of left rotator cuff 09/24/2017  . Acute pain of left shoulder 03/12/2017  . Rib pain 03/12/2017  . DDD (degenerative disc disease), cervical 10/31/2016  . Primary osteoarthritis of both hands 10/31/2016  . Primary osteoarthritis of both feet 10/31/2016  . High risk medication use 07/06/2016  . History of diabetes mellitus 07/06/2016  . History of coronary artery disease 07/06/2016  . Rheumatoid arthritis involving multiple sites with positive rheumatoid factor ([place]) 06/02/2016  . Other fatigue 06/02/2016  . Contracture, elbow, left 06/02/2016  . Swelling of left hand 05/01/2016  . Osteoarthritis of right knee 08/27/2015  . S/P TKR (total knee replacement), left 08/27/2015  . Essential hypertension 05/11/2015  . Pain in the chest   . Right groin pain 08/15/2012  . Chest pain 04/02/2012  . Cerebrovascular disease 04/02/2012  . CAD (coronary artery disease) 04/02/2012  . Diabetes mellitus without complication ([place])   . Dizziness   . Sinus bradycardia   . Hyperlipidemia   . Edema     Past Medical History:  Diagnosis Date  . Anemia   . Arthritis    "all over"  . Bradycardia   . Chronic bronchitis ([place])    "get it just about q yr" (03/17/2014)  .  Daily headache    "here lately" (03/17/2014); relates to sinuses  . GERD (gastroesophageal reflux disease)   . Hard of hearing    hearing aides bilat  . Hiatal hernia   . History of blood transfusion 2008   "related to OR"  . History of kidney stones   . Hyperlipidemia   . Hypertension   . Pneumonia 1972 X 1  . Type II diabetes mellitus ([place])   . Wears glasses     Family History  Problem  Relation Age of Onset  . Cancer Father        prostate   Past Surgical History:  Procedure Laterality Date  . broken finger      surgical repaired left hand 2nd finger   . cyst removed      per left knee/posteriorly  . CYSTECTOMY Left 2009   "behind knee"  . INGUINAL HERNIA REPAIR Right ?2010  . JOINT REPLACEMENT    . KNEE ARTHROSCOPY Bilateral 1980's  . PARTIAL KNEE ARTHROPLASTY Right 08/27/2015   Procedure: RIGHT UNICOMPARTMENTAL KNEE ARTHROPLASTY;  Surgeon: Mcarthur Rossetti, MD;  Location: [facility];  Service: Orthopedics;  Laterality: Right;  . REVISION TOTAL KNEE ARTHROPLASTY Left 2008  . SHOULDER SURGERY Left 2019  . TOTAL KNEE ARTHROPLASTY Left 2003   Social History   Social History Narrative   Patient lives at home with family.   Caffeine Use: occasionally   Immunization History  Administered Date(s) Administered  . Pneumococcal Conjugate-13 01/13/2014     Objective: Vital Signs: BP 126/65 (BP Location: Left Arm, Patient Position: Sitting, Cuff Size: Normal)   Pulse (!) 42   Resp 15   Ht '5\' 6"'  (1.676 m)   Wt 208 lb (94.3 kg)   BMI 33.57 kg/m    Physical Exam Vitals and nursing note reviewed.  Constitutional:      Appearance: Joel Jones is well-developed.  HENT:     Head: Normocephalic and atraumatic.  Eyes:     Conjunctiva/sclera: Conjunctivae normal.     Pupils: Pupils are equal, round, and reactive to light.  Pulmonary:     Effort: Pulmonary effort is normal.  Abdominal:     General: Bowel sounds are normal.     Palpations: Abdomen is soft.  Musculoskeletal:     Cervical back: Normal range of motion and neck supple.  Skin:    General: Skin is warm and dry.     Capillary Refill: Capillary refill takes less than 2 seconds.  Neurological:     Mental Status: Joel Jones is alert and oriented to person, place, and time.  Psychiatric:        Behavior: Behavior normal.      Musculoskeletal Exam: C-spine has limited range of motion with lateral rotation.   Thoracic kyphosis noted.  Shoulder joints have good range of motion with no discomfort.  Elbow joints, wrist joints, MCPs have good range of motion with no synovitis.  Joel Jones has incomplete fist formation of the left hand.  PIP and DIP thickening consistent with osteoarthritis of both hands.  Bilateral partial knee replacements have good range of motion.  Painful range of motion of the right knee on exam.  Ankle joints have good range of motion with no tenderness or inflammation.  CDAI Exam: CDAI Score: 1  Patient Global: 5 mm; Provider Global: 5 mm Swollen: 0 ; Tender: 0  Joint Exam 12/29/2019   No joint exam has been documented for this visit   There is currently no information documented on the  homunculus. Go to the Rheumatology activity and complete the homunculus joint exam.  Investigation: No additional findings.  Imaging: XR Foot 2 Views Left  Result Date: 12/29/2019 First MTP, PIP and DIP narrowing was noted.  No significant other MTP narrowing was noted.  No erosive changes were noted.  Dorsal spurring was noted.  No radiographic progression was noted when compared to the x-rays of 2018. Impression: These findings are consistent with osteoarthritis.  XR Foot 2 Views Right  Result Date: 12/29/2019 First MTP, PIP and DIP narrowing was noted.  No significant other MTP narrowing was noted.  No erosive changes were noted.  Dorsal spurring was noted.  No radiographic progression was noted when compared to the x-rays of 2018. Impression: These findings are consistent with osteoarthritis.  XR Hand 2 View Left  Result Date: 12/29/2019 Severe CMC PIP and DIP narrowing was noted.  Narrowing of first second and third MCP joints was noted.  Mild intercarpal and radiocarpal joint space narrowing was noted.  No erosive changes were noted.  No radiographic progression was noted when compared to the x-rays of 2018. Impression: These findings are consistent with rheumatoid arthritis and osteoarthritis  overlap.  XR Hand 2 View Right  Result Date: 12/29/2019 Severe CMC, PIP and DIP narrowing was noted.  Narrowing of almost all MCP joints was noted.  Juxta-articular osteopenia was noted.  Mild intercarpal and radiocarpal joint space narrowing was noted.  No erosive changes were noted.  No radiographic progression was noted when compared to the x-rays of 2018. Impression: These findings are consistent with rheumatoid arthritis and osteoarthritis overlap.   Recent Labs: Lab Results  Component Value Date   WBC 6.2 12/01/2019   HGB 13.3 12/01/2019   PLT 264 12/01/2019   NA 140 12/01/2019   K 3.7 12/01/2019   CL 106 12/01/2019   CO2 26 12/01/2019   GLUCOSE 85 12/01/2019   BUN 12 12/01/2019   CREATININE 1.04 12/01/2019   BILITOT 0.8 12/01/2019   ALKPHOS 91 12/01/2019   AST 28 12/01/2019   ALT 30 12/01/2019   PROT 6.1 (L) 12/01/2019   ALBUMIN 3.7 12/01/2019   CALCIUM 8.9 12/01/2019   GFRAA >60 12/01/2019   QFTBGOLDPLUS Negative 06/16/2019    Speciality Comments: Orencia 726m every 4 weeks TB gold negative on Jan 2019  Procedures:  No procedures performed Allergies: Codeine, Lipitor [atorvastatin], Invokana [canagliflozin], Losartan, Roxicodone [oxycodone], and Vicodin [hydrocodone-acetaminophen]   Assessment / Plan:     Visit Diagnoses: Rheumatoid arthritis involving multiple sites with positive rheumatoid factor ([facility] [facility] - +RF,+antiCCP, history of elevated ESR: Joel Jones has no synovitis on exam.  Joel Jones presents today with increased pain and stiffness in the left hand which started 1 week ago.  Joel Jones is due for his Orencia IV infusion today.  Joel Jones continues to receive infusions every 28 days.  Joel Jones typically experiences increased pain and stiffness in his left hand about 7 to 10 days prior to these infusions.  Joel Jones reports that the joint pain resolves the night of receiving his infusion.  Joel Jones does not want to make any medication changes at this time.  X-rays of both hands and feet were obtained today to  assess for radiographic progression.  Joel Jones was advised to notify [place] his joint pain persists or worsens.  Joel Jones will follow-up in the office in 5 months.- Plan: XR Hand 2 View Right, XR Hand 2 View Left, XR Foot 2 Views Right, XR Foot 2 Views Left.  X-rays did not show any radiographic  progression when compared to the films of 2018.  High risk medication use - Orencia 750 mg IV infusions, Arava discontinue to due to elevated LFTs in February 2019 (inadequate response to Humira in the past).  CBC and CMP were drawn on 12/01/2019.  TB gold negative on 06/16/2019. Joel Jones has not received the COVID-19 vaccination and is holding off at this time.  We discussed the importance of wearing a mask, social distancing, and practicing good hand hygiene.  Joel Jones was advised to notify us if Joel Jones develops the COVID-19 infection since Joel Jones will require the antibody infusion.  Primary osteoarthritis of both hands: Joel Jones has PIP and DIP thickening consistent with osteoarthritis of both hands.  Joel Jones has incomplete fist formation of the left hand.  Joel Jones has been experiencing increased pain and stiffness in the left hand for the past 1 week.  Joel Jones is due for his Orencia infusion today.  We discussed the importance of joint protection and muscle strengthening.  Primary osteoarthritis of both feet: Joel Jones is not experiencing any discomfort in his feet at this time.  Joel Jones wears proper fitting shoes.  S/P TKR (total knee replacement), left: Doing well.  Joel Jones has good range of motion with no discomfort at this time.  Status post right partial knee replacement - Joel Jones will be scheduling a total right knee replacement with Dr. Ninfa Linden in the future.  Joel Jones continues to have chronic pain in the right knee.  Joel Jones was encouraged to remain active and work on lower extremity muscle strengthening.   DDD (degenerative disc disease), cervical: Joel Jones has limited range of motion especially with lateral rotation.  Joel Jones has no symptoms of radiculopathy at this time.  Other medical  conditions are listed as follows:   Chronic insomnia  Other fatigue  History of coronary artery disease  History of diabetes mellitus - Joel Jones is on insulin pump.   History of hyperlipidemia  History of hypertension  Orders: Orders Placed This Encounter  Procedures  . XR Hand 2 View Right  . XR Hand 2 View Left  . XR Foot 2 Views Right  . XR Foot 2 Views Left   No orders of the defined types were placed in this encounter.   Follow-Up Instructions: Return in about 5 months (around 05/30/2020) for Rheumatoid arthritis, Osteoarthritis.   Acquanetta Sit, PA-C  I examined and evaluated the patient with Hazel Sams PA.  Patient continues to have some joint pain due to underlying osteoarthritis.  Joel Jones had no synovitis on my examination.  X-rays obtained today did not show any radiographic progression.  The plan of care was discussed as noted above.  Bo Merino, MD  Note - This record has been created using Editor, commissioning.  Chart creation errors have been sought, but [place] not always  have been located. Such creation errors do not reflect on  the standard of medical care.

## 2019-12-29 ENCOUNTER — Telehealth: Payer: Self-pay

## 2019-12-29 ENCOUNTER — Ambulatory Visit (INDEPENDENT_AMBULATORY_CARE_PROVIDER_SITE_OTHER): Payer: Medicare Other

## 2019-12-29 ENCOUNTER — Ambulatory Visit (HOSPITAL_COMMUNITY)
Admission: RE | Admit: 2019-12-29 | Discharge: 2019-12-29 | Disposition: A | Discharge status: Home / Self Care | Payer: Medicare Other | Source: Ambulatory Visit | Attending: Rheumatology | Admitting: Rheumatology

## 2019-12-29 ENCOUNTER — Encounter: Payer: Self-pay | Admitting: Rheumatology

## 2019-12-29 ENCOUNTER — Other Ambulatory Visit: Payer: Self-pay

## 2019-12-29 ENCOUNTER — Ambulatory Visit: Payer: Self-pay

## 2019-12-29 ENCOUNTER — Ambulatory Visit (INDEPENDENT_AMBULATORY_CARE_PROVIDER_SITE_OTHER): Payer: Medicare Other | Admitting: Rheumatology

## 2019-12-29 VITALS — BP 126/65 | HR 42 | Resp 15 | Ht 66.0 in | Wt 208.0 lb

## 2019-12-29 DIAGNOSIS — M19041 Primary osteoarthritis, right hand: Secondary | ICD-10-CM

## 2019-12-29 DIAGNOSIS — Z96652 Presence of left artificial knee joint: Secondary | ICD-10-CM | POA: Diagnosis not present

## 2019-12-29 DIAGNOSIS — M19042 Primary osteoarthritis, left hand: Secondary | ICD-10-CM

## 2019-12-29 DIAGNOSIS — Z96651 Presence of right artificial knee joint: Secondary | ICD-10-CM

## 2019-12-29 DIAGNOSIS — Z8639 Personal history of other endocrine, nutritional and metabolic disease: Secondary | ICD-10-CM | POA: Diagnosis not present

## 2019-12-29 DIAGNOSIS — M5030 Other cervical disc degeneration, unspecified cervical region: Secondary | ICD-10-CM

## 2019-12-29 DIAGNOSIS — M0579 Rheumatoid arthritis with rheumatoid factor of multiple sites without organ or systems involvement: Secondary | ICD-10-CM

## 2019-12-29 DIAGNOSIS — M19071 Primary osteoarthritis, right ankle and foot: Secondary | ICD-10-CM | POA: Diagnosis not present

## 2019-12-29 DIAGNOSIS — R5383 Other fatigue: Secondary | ICD-10-CM | POA: Diagnosis not present

## 2019-12-29 DIAGNOSIS — Z8679 Personal history of other diseases of the circulatory system: Secondary | ICD-10-CM

## 2019-12-29 DIAGNOSIS — Z79899 Other long term (current) drug therapy: Secondary | ICD-10-CM

## 2019-12-29 DIAGNOSIS — M19072 Primary osteoarthritis, left ankle and foot: Secondary | ICD-10-CM

## 2019-12-29 DIAGNOSIS — F5104 Psychophysiologic insomnia: Secondary | ICD-10-CM

## 2019-12-29 MED ORDER — SODIUM CHLORIDE 0.9 % IV SOLN
750.0000 mg | INTRAVENOUS | Status: DC
  Administered 2019-12-29: 750 mg via INTRAVENOUS
  Filled 2019-12-29: qty 30

## 2019-12-29 MED ORDER — DIPHENHYDRAMINE HCL 25 MG PO CAPS: 25.0000 mg | ORAL_CAPSULE | Freq: Once | ORAL | Status: DC

## 2019-12-29 MED ORDER — ACETAMINOPHEN 325 MG PO TABS: 650.0000 mg | ORAL_TABLET | Freq: Once | ORAL | Status: DC

## 2019-12-29 NOTE — Telephone Encounter (Signed)
Called patient and advised him that X-rays did not show any radiographic progression when compared to the films of 2018. Patient verbalized understanding.

## 2019-12-29 NOTE — Patient Instructions (Signed)
COVID-19 vaccine recommendations:   COVID-19 vaccine is recommended for everyone (unless you are allergic to a vaccine component), even if you are on a medication that suppresses your immune system.   If you are on Methotrexate, Cellcept (mycophenolate), Rinvoq, Joel Jones, and Olumiant- hold the medication for 1 week after each vaccine. Hold Methotrexate for 2 weeks after the single dose COVID-19 vaccine.   If you are on Orencia subcutaneous injection - hold medication one week prior to and one week after the first COVID-19 vaccine dose (only).   If you are on Orencia IV infusions- time vaccination administration so that the first COVID-19 vaccination will occur four weeks after the infusion and postpone the subsequent infusion by one week.   If you are on Cyclophosphamide or Rituxan infusions please contact your doctor prior to receiving the COVID-19 vaccine.   Do not take Tylenol or ant anti-inflammatory medications (NSAIDs) 24 hours prior to the COVID-19 vaccination.   There is no direct evidence about the efficacy of the COVID-19 vaccine in individuals who are on medications that suppress the immune system.   Even if you are fully vaccinated, and you are on any medications that suppress your immune system, please continue to wear a mask, maintain at least six feet social distance and practice hand hygiene.   If you develop a COVID-19 infection, please contact your PCP or our office to determine if you need antibody infusion.  We anticipate that a booster vaccine will be available soon for immunosuppressed individuals. Please call our office before receiving your booster dose to make adjustments to your medication regimen.   [email or web address]

## 2020-01-26 ENCOUNTER — Ambulatory Visit (HOSPITAL_COMMUNITY): Payer: Medicare Other

## 2020-01-26 ENCOUNTER — Other Ambulatory Visit: Payer: Self-pay

## 2020-01-26 ENCOUNTER — Encounter (HOSPITAL_COMMUNITY)
Admission: RE | Admit: 2020-01-26 | Discharge: 2020-01-26 | Disposition: A | Discharge status: Home / Self Care | Payer: Medicare Other | Source: Ambulatory Visit | Attending: Rheumatology | Admitting: Rheumatology

## 2020-01-26 DIAGNOSIS — M0579 Rheumatoid arthritis with rheumatoid factor of multiple sites without organ or systems involvement: Secondary | ICD-10-CM | POA: Insufficient documentation

## 2020-01-26 LAB — COMPREHENSIVE METABOLIC PANEL
ALT: 23 U/L (ref 0–44)
AST: 24 U/L (ref 15–41)
Albumin: 3.6 g/dL (ref 3.5–5.0)
Alkaline Phosphatase: 86 U/L (ref 38–126)
Anion gap: 8 (ref 5–15)
BUN: 14 mg/dL (ref 8–23)
CO2: 26 mmol/L (ref 22–32)
Calcium: 8.6 mg/dL — ABNORMAL LOW (ref 8.9–10.3)
Chloride: 107 mmol/L (ref 98–111)
Creatinine, Ser: 1.02 mg/dL (ref 0.61–1.24)
GFR calc Af Amer: >60 mL/min (ref >60)
GFR calc non Af Amer: >60 mL/min (ref >60)
Glucose, Bld: 131 mg/dL — ABNORMAL HIGH (ref 70–99)
Potassium: 4 mmol/L (ref 3.5–5.1)
Sodium: 141 mmol/L (ref 135–145)
Total Bilirubin: 0.8 mg/dL (ref 0.3–1.2)
Total Protein: 6.1 g/dL — ABNORMAL LOW (ref 6.5–8.1)

## 2020-01-26 LAB — CBC WITH DIFFERENTIAL/PLATELET
Abs Immature Granulocytes: 0.01 10*3/uL (ref 0.00–0.07)
Basophils Absolute: 0.1 10*3/uL (ref 0.0–0.1)
Basophils Relative: 1 %
Eosinophils Absolute: 0.3 10*3/uL (ref 0.0–0.5)
Eosinophils Relative: 5 %
HCT: 39.4 % (ref 39.0–52.0)
Hemoglobin: 13.1 g/dL (ref 13.0–17.0)
Immature Granulocytes: 0 %
Lymphocytes Relative: 43 %
Lymphs Abs: 2.5 10*3/uL (ref 0.7–4.0)
MCH: 30.3 pg (ref 26.0–34.0)
MCHC: 33.2 g/dL (ref 30.0–36.0)
MCV: 91 fL (ref 80.0–100.0)
Monocytes Absolute: 0.7 10*3/uL (ref 0.1–1.0)
Monocytes Relative: 12 %
Neutro Abs: 2.3 10*3/uL (ref 1.7–7.7)
Neutrophils Relative %: 39 %
Platelets: 228 10*3/uL (ref 150–400)
RBC: 4.33 MIL/uL (ref 4.22–5.81)
RDW: 12.8 % (ref 11.5–15.5)
WBC: 5.8 10*3/uL (ref 4.0–10.5)
nRBC: 0 % (ref 0.0–0.2)

## 2020-01-26 MED ORDER — SODIUM CHLORIDE 0.9 % IV SOLN
750.0000 mg | INTRAVENOUS | Status: DC
  Administered 2020-01-26: 750 mg via INTRAVENOUS
  Filled 2020-01-26: qty 30

## 2020-01-26 MED ORDER — DIPHENHYDRAMINE HCL 25 MG PO CAPS: 25.0000 mg | ORAL_CAPSULE | Freq: Once | ORAL | Status: DC

## 2020-01-26 MED ORDER — ACETAMINOPHEN 325 MG PO TABS: 650.0000 mg | ORAL_TABLET | Freq: Once | ORAL | Status: DC

## 2020-01-26 NOTE — Progress Notes (Signed)
CBC within normal limits.  CMP stable.  Continue current regimen of Orencia IV.

## 2020-02-02 DIAGNOSIS — E7800 Pure hypercholesterolemia, unspecified: Secondary | ICD-10-CM | POA: Diagnosis not present

## 2020-02-02 DIAGNOSIS — Z794 Long term (current) use of insulin: Secondary | ICD-10-CM | POA: Diagnosis not present

## 2020-02-02 DIAGNOSIS — Z0000 Encounter for general adult medical examination without abnormal findings: Secondary | ICD-10-CM | POA: Diagnosis not present

## 2020-02-02 DIAGNOSIS — Z125 Encounter for screening for malignant neoplasm of prostate: Secondary | ICD-10-CM | POA: Diagnosis not present

## 2020-02-02 DIAGNOSIS — E1122 Type 2 diabetes mellitus with diabetic chronic kidney disease: Secondary | ICD-10-CM | POA: Diagnosis not present

## 2020-02-02 DIAGNOSIS — N183 Chronic kidney disease, stage 3 unspecified: Secondary | ICD-10-CM | POA: Diagnosis not present

## 2020-02-02 DIAGNOSIS — I10 Essential (primary) hypertension: Secondary | ICD-10-CM | POA: Diagnosis not present

## 2020-02-02 DIAGNOSIS — Z1389 Encounter for screening for other disorder: Secondary | ICD-10-CM | POA: Diagnosis not present

## 2020-02-23 ENCOUNTER — Other Ambulatory Visit: Payer: Self-pay

## 2020-02-23 ENCOUNTER — Ambulatory Visit (HOSPITAL_COMMUNITY)
Admission: RE | Admit: 2020-02-23 | Discharge: 2020-02-23 | Disposition: A | Discharge status: Home / Self Care | Payer: Medicare Other | Source: Ambulatory Visit | Attending: Rheumatology | Admitting: Rheumatology

## 2020-02-23 DIAGNOSIS — M0579 Rheumatoid arthritis with rheumatoid factor of multiple sites without organ or systems involvement: Secondary | ICD-10-CM | POA: Diagnosis not present

## 2020-02-23 MED ORDER — ACETAMINOPHEN 325 MG PO TABS: 650.0000 mg | ORAL_TABLET | Freq: Once | ORAL | Status: DC

## 2020-02-23 MED ORDER — DIPHENHYDRAMINE HCL 25 MG PO CAPS: 25.0000 mg | ORAL_CAPSULE | Freq: Once | ORAL | Status: DC

## 2020-02-23 MED ORDER — SODIUM CHLORIDE 0.9 % IV SOLN
750.0000 mg | INTRAVENOUS | Status: DC
  Administered 2020-02-23: 750 mg via INTRAVENOUS
  Filled 2020-02-23: qty 30

## 2020-03-04 DIAGNOSIS — E1122 Type 2 diabetes mellitus with diabetic chronic kidney disease: Secondary | ICD-10-CM | POA: Diagnosis not present

## 2020-03-04 DIAGNOSIS — I10 Essential (primary) hypertension: Secondary | ICD-10-CM | POA: Diagnosis not present

## 2020-03-04 DIAGNOSIS — Z794 Long term (current) use of insulin: Secondary | ICD-10-CM | POA: Diagnosis not present

## 2020-03-04 DIAGNOSIS — N182 Chronic kidney disease, stage 2 (mild): Secondary | ICD-10-CM | POA: Diagnosis not present

## 2020-03-18 ENCOUNTER — Telehealth: Payer: Self-pay | Admitting: *Deleted

## 2020-03-18 ENCOUNTER — Other Ambulatory Visit: Payer: Self-pay | Admitting: Physician Assistant

## 2020-03-18 DIAGNOSIS — M0579 Rheumatoid arthritis with rheumatoid factor of multiple sites without organ or systems involvement: Secondary | ICD-10-CM

## 2020-03-18 NOTE — Progress Notes (Signed)
Next infusion scheduled for Orencia and due for updated orders.  Last Visit: 12/29/19 Next Visit: 05/31/20 Labs: CBC and CMP drawn on 01/26/20  TB Gold: 06/16/19  Orders placed for Orencia x 3 doses along with premedication of Tylenol and Benadryl.  Standing CBC/CMP orders placed.  Reviewed orders with Dr. Estanislado Pandy prior to signing.   Hazel Sams, PA-C

## 2020-03-18 NOTE — Telephone Encounter (Signed)
Patient has an infusion scheduled for 03/22/2020. Please verify orders are in place. Thanks!

## 2020-03-18 NOTE — Telephone Encounter (Signed)
Orders placed and reviewed with Dr. Estanislado Pandy prior to signing.

## 2020-03-22 ENCOUNTER — Encounter (HOSPITAL_COMMUNITY)
Admission: RE | Admit: 2020-03-22 | Discharge: 2020-03-22 | Disposition: A | Discharge status: Home / Self Care | Payer: Medicare Other | Source: Ambulatory Visit | Attending: Rheumatology | Admitting: Rheumatology

## 2020-03-22 ENCOUNTER — Other Ambulatory Visit: Payer: Self-pay

## 2020-03-22 DIAGNOSIS — M0579 Rheumatoid arthritis with rheumatoid factor of multiple sites without organ or systems involvement: Secondary | ICD-10-CM | POA: Diagnosis not present

## 2020-03-22 LAB — COMPREHENSIVE METABOLIC PANEL
ALT: 31 U/L (ref 0–44)
AST: 53 U/L — ABNORMAL HIGH (ref 15–41)
Albumin: 3.7 g/dL (ref 3.5–5.0)
Alkaline Phosphatase: 82 U/L (ref 38–126)
Anion gap: 9 (ref 5–15)
BUN: 13 mg/dL (ref 8–23)
CO2: 23 mmol/L (ref 22–32)
Calcium: 8.8 mg/dL — ABNORMAL LOW (ref 8.9–10.3)
Chloride: 104 mmol/L (ref 98–111)
Creatinine, Ser: 0.96 mg/dL (ref 0.61–1.24)
GFR, Estimated: >60 mL/min (ref >60)
Glucose, Bld: 179 mg/dL — ABNORMAL HIGH (ref 70–99)
Potassium: 5.2 mmol/L — ABNORMAL HIGH (ref 3.5–5.1)
Sodium: 136 mmol/L (ref 135–145)
Total Bilirubin: 1.1 mg/dL (ref 0.3–1.2)
Total Protein: 6.1 g/dL — ABNORMAL LOW (ref 6.5–8.1)

## 2020-03-22 LAB — CBC WITH DIFFERENTIAL/PLATELET
Abs Immature Granulocytes: 0.01 10*3/uL (ref 0.00–0.07)
Basophils Absolute: 0.1 10*3/uL (ref 0.0–0.1)
Basophils Relative: 2 %
Eosinophils Absolute: 0.2 10*3/uL (ref 0.0–0.5)
Eosinophils Relative: 4 %
HCT: 40.5 % (ref 39.0–52.0)
Hemoglobin: 13.9 g/dL (ref 13.0–17.0)
Immature Granulocytes: 0 %
Lymphocytes Relative: 40 %
Lymphs Abs: 2 10*3/uL (ref 0.7–4.0)
MCH: 31.1 pg (ref 26.0–34.0)
MCHC: 34.3 g/dL (ref 30.0–36.0)
MCV: 90.6 fL (ref 80.0–100.0)
Monocytes Absolute: 0.5 10*3/uL (ref 0.1–1.0)
Monocytes Relative: 10 %
Neutro Abs: 2.1 10*3/uL (ref 1.7–7.7)
Neutrophils Relative %: 44 %
Platelets: 269 10*3/uL (ref 150–400)
RBC: 4.47 MIL/uL (ref 4.22–5.81)
RDW: 12.6 % (ref 11.5–15.5)
WBC: 4.9 10*3/uL (ref 4.0–10.5)
nRBC: 0 % (ref 0.0–0.2)

## 2020-03-22 MED ORDER — DIPHENHYDRAMINE HCL 25 MG PO CAPS: 25.0000 mg | ORAL_CAPSULE | ORAL | Status: DC

## 2020-03-22 MED ORDER — ACETAMINOPHEN 325 MG PO TABS: 650.0000 mg | ORAL_TABLET | ORAL | Status: DC

## 2020-03-22 MED ORDER — SODIUM CHLORIDE 0.9 % IV SOLN
750.0000 mg | INTRAVENOUS | Status: DC
  Administered 2020-03-22: 750 mg via INTRAVENOUS
  Filled 2020-03-22: qty 30

## 2020-03-22 NOTE — Progress Notes (Signed)
CBC WNL. Glucose is elevated-179. Please notify the patient.   Potassium is elevated and calcium is low.  Please forward lab results to PCP.    AST is also elevated.  Please notify the patient and advise him to avoid taking NSAIDs, tylenol, and alcohol use.  Recheck lab work with next infusion.

## 2020-03-31 ENCOUNTER — Encounter: Payer: Self-pay | Admitting: Physician Assistant

## 2020-03-31 ENCOUNTER — Other Ambulatory Visit: Payer: Self-pay

## 2020-03-31 ENCOUNTER — Ambulatory Visit (INDEPENDENT_AMBULATORY_CARE_PROVIDER_SITE_OTHER): Payer: Medicare Other | Admitting: Physician Assistant

## 2020-03-31 DIAGNOSIS — S29011A Strain of muscle and tendon of front wall of thorax, initial encounter: Secondary | ICD-10-CM

## 2020-03-31 MED ORDER — TIZANIDINE HCL 2 MG PO CAPS: 2.0000 mg | ORAL_CAPSULE | Freq: Two times a day (BID) | ORAL | 0 refills | Status: DC

## 2020-03-31 NOTE — Progress Notes (Signed)
Office Visit Note   Patient: Joel Merced Goding Sr.           Date of Birth: July 19, 1945           MRN: [id number] Visit Date: 03/31/2020              Requested by: Orlena Sheldon, PA-C No address on file PCP: Orlena Sheldon, PA-C   Assessment & Plan: Visit Diagnoses:  1. Pectoralis muscle strain, initial encounter     Plan: Recommend relative rest.  Heat ice interchangeably.  We will give him a muscle accident likely take at night if not to drive on the muscle relaxant.  He will follow up with [place] if his condition becomes worse.  Deteriorated.  Follow-Up Instructions: Return if symptoms worsen or fail to improve.   Orders:  No orders of the defined types were placed in this encounter.  Meds ordered this encounter  Medications  . tizanidine (ZANAFLEX) 2 MG capsule    Sig: Take 1 capsule (2 mg total) by mouth 2 (two) times daily.    Dispense:  20 capsule    Refill:  0      Procedures: No procedures performed   Clinical Data: No additional findings.   Subjective: Chief Complaint  Patient presents with  . Left sided rib pain    HPI Joel Jones is a 74 year old male well-known the body by my service comes in today with left-sided chest pain.  He states this past Saturday he was moving some brush and had some pain off and on in his chest area since then.  He states pain is worse with deep breathing lifting the left arm.  Has had no fevers chills or heart palpitations.  He has tried Voltaren gel and heat without any real relief.  He denies any particular injury. Review of Systems See HPI.  Objective: Vital Signs: There were no vitals taken for this visit.  Physical Exam Constitutional:      Appearance: He is not ill-appearing or diaphoretic.  Cardiovascular:     Heart sounds: Normal heart sounds.  Pulmonary:     Effort: Pulmonary effort is normal.     Breath sounds: Normal breath sounds. No wheezing or rales.  Neurological:     Mental Status: He is alert and oriented to  person, place, and time.  Psychiatric:        Mood and Affect: Mood normal.     Ortho Exam Left shoulder good range of motion without pain.  When he flexes his pectoralis muscles he has pain in the left pectoralis muscle.  He has swelling of the pectoralis muscle on the left with tenderness with palpation but no ecchymosis. Specialty Comments:  No specialty comments available.  Imaging: No results found.   PMFS History: Patient Active Problem List   Diagnosis Date Noted  . Tear of left rotator cuff 09/24/2017  . Acute pain of left shoulder 03/12/2017  . Rib pain 03/12/2017  . DDD (degenerative disc disease), cervical 10/31/2016  . Primary osteoarthritis of both hands 10/31/2016  . Primary osteoarthritis of both feet 10/31/2016  . High risk medication use 07/06/2016  . History of diabetes mellitus 07/06/2016  . History of coronary artery disease 07/06/2016  . Rheumatoid arthritis involving multiple sites with positive rheumatoid factor ([place]) 06/02/2016  . Other fatigue 06/02/2016  . Contracture, elbow, left 06/02/2016  . Swelling of left hand 05/01/2016  . Osteoarthritis of right knee 08/27/2015  . S/P TKR (total knee replacement),  left 08/27/2015  . Essential hypertension 05/11/2015  . Pain in the chest   . Right groin pain 08/15/2012  . Chest pain 04/02/2012  . Cerebrovascular disease 04/02/2012  . CAD (coronary artery disease) 04/02/2012  . Diabetes mellitus without complication ([place])   . Dizziness   . Sinus bradycardia   . Hyperlipidemia   . Edema    Past Medical History:  Diagnosis Date  . Anemia   . Arthritis    "all over"  . Bradycardia   . Chronic bronchitis ([place])    "get it just about q yr" (03/17/2014)  . Daily headache    "here lately" (03/17/2014); relates to sinuses  . GERD (gastroesophageal reflux disease)   . Hard of hearing    hearing aides bilat  . Hiatal hernia   . History of blood transfusion 2008   "related to OR"  . History of kidney  stones   . Hyperlipidemia   . Hypertension   . Pneumonia 1972 X 1  . Type II diabetes mellitus ([place])   . Wears glasses     Family History  Problem Relation Age of Onset  . Cancer Father        prostate    Past Surgical History:  Procedure Laterality Date  . broken finger      surgical repaired left hand 2nd finger   . cyst removed      per left knee/posteriorly  . CYSTECTOMY Left 2009   "behind knee"  . INGUINAL HERNIA REPAIR Right ?2010  . JOINT REPLACEMENT    . KNEE ARTHROSCOPY Bilateral 1980's  . PARTIAL KNEE ARTHROPLASTY Right 08/27/2015   Procedure: RIGHT UNICOMPARTMENTAL KNEE ARTHROPLASTY;  Surgeon: Mcarthur Rossetti, MD;  Location: [facility];  Service: Orthopedics;  Laterality: Right;  . REVISION TOTAL KNEE ARTHROPLASTY Left 2008  . SHOULDER SURGERY Left 2019  . TOTAL KNEE ARTHROPLASTY Left 2003   Social History   Occupational History    Employer: UNEMPLOYED  Tobacco Use  . Smoking status: Former Smoker    Packs/day: 1.00    Years: 5.00    Pack years: 5.00    Types: Cigarettes, Cigars    Quit date: 05/15/1968    Years since quitting: 51.9  . Smokeless tobacco: Never Used  Vaping Use  . Vaping Use: Never used  Substance and Sexual Activity  . Alcohol use: No    Alcohol/week: 0.0 standard drinks  . Drug use: Never  . Sexual activity: Yes

## 2020-04-19 ENCOUNTER — Ambulatory Visit (HOSPITAL_COMMUNITY)
Admission: RE | Admit: 2020-04-19 | Discharge: 2020-04-19 | Disposition: A | Discharge status: Home / Self Care | Payer: Medicare Other | Source: Ambulatory Visit | Attending: Rheumatology | Admitting: Rheumatology

## 2020-04-19 DIAGNOSIS — M0579 Rheumatoid arthritis with rheumatoid factor of multiple sites without organ or systems involvement: Secondary | ICD-10-CM | POA: Diagnosis not present

## 2020-04-19 MED ORDER — SODIUM CHLORIDE 0.9 % IV SOLN
750.0000 mg | INTRAVENOUS | Status: DC
  Administered 2020-04-19: 750 mg via INTRAVENOUS
  Filled 2020-04-19: qty 30

## 2020-04-20 DIAGNOSIS — K219 Gastro-esophageal reflux disease without esophagitis: Secondary | ICD-10-CM | POA: Diagnosis not present

## 2020-05-17 ENCOUNTER — Ambulatory Visit (HOSPITAL_COMMUNITY)
Admission: RE | Admit: 2020-05-17 | Discharge: 2020-05-17 | Disposition: A | Discharge status: Home / Self Care | Payer: Medicare Other | Source: Ambulatory Visit | Attending: Rheumatology | Admitting: Rheumatology

## 2020-05-17 ENCOUNTER — Other Ambulatory Visit: Payer: Self-pay

## 2020-05-17 DIAGNOSIS — M0579 Rheumatoid arthritis with rheumatoid factor of multiple sites without organ or systems involvement: Secondary | ICD-10-CM | POA: Diagnosis not present

## 2020-05-17 LAB — CBC WITH DIFFERENTIAL/PLATELET
Abs Immature Granulocytes: 0.02 10*3/uL (ref 0.00–0.07)
Basophils Absolute: 0.1 10*3/uL (ref 0.0–0.1)
Basophils Relative: 1 %
Eosinophils Absolute: 0.3 10*3/uL (ref 0.0–0.5)
Eosinophils Relative: 4 %
HCT: 41.4 % (ref 39.0–52.0)
Hemoglobin: 14 g/dL (ref 13.0–17.0)
Immature Granulocytes: 0 %
Lymphocytes Relative: 31 %
Lymphs Abs: 2.1 10*3/uL (ref 0.7–4.0)
MCH: 30.6 pg (ref 26.0–34.0)
MCHC: 33.8 g/dL (ref 30.0–36.0)
MCV: 90.4 fL (ref 80.0–100.0)
Monocytes Absolute: 0.8 10*3/uL (ref 0.1–1.0)
Monocytes Relative: 11 %
Neutro Abs: 3.5 10*3/uL (ref 1.7–7.7)
Neutrophils Relative %: 53 %
Platelets: 246 10*3/uL (ref 150–400)
RBC: 4.58 MIL/uL (ref 4.22–5.81)
RDW: 12.8 % (ref 11.5–15.5)
WBC: 6.7 10*3/uL (ref 4.0–10.5)
nRBC: 0 % (ref 0.0–0.2)

## 2020-05-17 LAB — COMPREHENSIVE METABOLIC PANEL
ALT: 29 U/L (ref 0–44)
AST: 65 U/L — ABNORMAL HIGH (ref 15–41)
Albumin: 3.9 g/dL (ref 3.5–5.0)
Alkaline Phosphatase: 79 U/L (ref 38–126)
Anion gap: 11 (ref 5–15)
BUN: 17 mg/dL (ref 8–23)
CO2: 24 mmol/L (ref 22–32)
Calcium: 8.7 mg/dL — ABNORMAL LOW (ref 8.9–10.3)
Chloride: 100 mmol/L (ref 98–111)
Creatinine, Ser: 1.08 mg/dL (ref 0.61–1.24)
GFR, Estimated: >60 mL/min (ref >60)
Glucose, Bld: 140 mg/dL — ABNORMAL HIGH (ref 70–99)
Potassium: 5 mmol/L (ref 3.5–5.1)
Sodium: 135 mmol/L (ref 135–145)
Total Bilirubin: 0.8 mg/dL (ref 0.3–1.2)
Total Protein: 6.3 g/dL — ABNORMAL LOW (ref 6.5–8.1)

## 2020-05-17 MED ORDER — SODIUM CHLORIDE 0.9 % IV SOLN
750.0000 mg | INTRAVENOUS | Status: DC
  Administered 2020-05-17: 750 mg via INTRAVENOUS
  Filled 2020-05-17: qty 30

## 2020-05-17 NOTE — Progress Notes (Deleted)
Office Visit Note  Patient: Joel Licursi Lambert Sr.             Date of Birth: 09-15-1945           MRN: [id number]             PCP: Deon Pilling Referring: Deon Pilling Visit Date: 05/31/2020 Occupation: @GUAROCC @  Subjective:  No chief complaint on file.   History of Present Illness: Joel Mentink Hannay Sr. is a 75 y.o. male ***   Activities of Daily Living:  Patient reports morning stiffness for *** {minute/hour:19697}.   Patient {ACTIONS;DENIES/REPORTS:21021675::"Denies"} nocturnal pain.  Difficulty dressing/grooming: {ACTIONS;DENIES/REPORTS:21021675::"Denies"} Difficulty climbing stairs: {ACTIONS;DENIES/REPORTS:21021675::"Denies"} Difficulty getting out of chair: {ACTIONS;DENIES/REPORTS:21021675::"Denies"} Difficulty using hands for taps, buttons, cutlery, and/or writing: {ACTIONS;DENIES/REPORTS:21021675::"Denies"}  No Rheumatology ROS completed.   PMFS History:  Patient Active Problem List   Diagnosis Date Noted  . Tear of left rotator cuff 09/24/2017  . Acute pain of left shoulder 03/12/2017  . Rib pain 03/12/2017  . DDD (degenerative disc disease), cervical 10/31/2016  . Primary osteoarthritis of both hands 10/31/2016  . Primary osteoarthritis of both feet 10/31/2016  . High risk medication use 07/06/2016  . History of diabetes mellitus 07/06/2016  . History of coronary artery disease 07/06/2016  . Rheumatoid arthritis involving multiple sites with positive rheumatoid factor (HCC) 06/02/2016  . Other fatigue 06/02/2016  . Contracture, elbow, left 06/02/2016  . Swelling of left hand 05/01/2016  . Osteoarthritis of right knee 08/27/2015  . S/P TKR (total knee replacement), left 08/27/2015  . Essential hypertension 05/11/2015  . Pain in the chest   . Right groin pain 08/15/2012  . Chest pain 04/02/2012  . Cerebrovascular disease 04/02/2012  . CAD (coronary artery disease) 04/02/2012  . Diabetes mellitus without complication (HCC)   . Dizziness   . Sinus  bradycardia   . Hyperlipidemia   . Edema     Past Medical History:  Diagnosis Date  . Anemia   . Arthritis    "all over"  . Bradycardia   . Chronic bronchitis (HCC)    "get it just about q yr" (03/17/2014)  . Daily headache    "here lately" (03/17/2014); relates to sinuses  . GERD (gastroesophageal reflux disease)   . Hard of hearing    hearing aides bilat  . Hiatal hernia   . History of blood transfusion 2008   "related to OR"  . History of kidney stones   . Hyperlipidemia   . Hypertension   . Pneumonia 1972 X 1  . Type II diabetes mellitus (HCC)   . Wears glasses     Family History  Problem Relation Age of Onset  . Cancer Father        prostate   Past Surgical History:  Procedure Laterality Date  . broken finger      surgical repaired left hand 2nd finger   . cyst removed      per left knee/posteriorly  . CYSTECTOMY Left 2009   "behind knee"  . INGUINAL HERNIA REPAIR Right ?2010  . JOINT REPLACEMENT    . KNEE ARTHROSCOPY Bilateral 1980's  . PARTIAL KNEE ARTHROPLASTY Right 08/27/2015   Procedure: RIGHT UNICOMPARTMENTAL KNEE ARTHROPLASTY;  Surgeon: Kathryne Hitch, MD;  Location: [facility];  Service: Orthopedics;  Laterality: Right;  . REVISION TOTAL KNEE ARTHROPLASTY Left 2008  . SHOULDER SURGERY Left 2019  . TOTAL KNEE ARTHROPLASTY Left 2003   Social History   Social History Narrative   Patient  lives at home with family.   Caffeine Use: occasionally   Immunization History  Administered Date(s) Administered  . Pneumococcal Conjugate-13 01/13/2014     Objective: Vital Signs: There were no vitals taken for this visit.   Physical Exam   Musculoskeletal Exam: ***  CDAI Exam: CDAI Score: -- Patient Global: --; Provider Global: -- Swollen: --; Tender: -- Joint Exam 05/31/2020   No joint exam has been documented for this visit   There is currently no information documented on the homunculus. Go to the Rheumatology activity and complete the  homunculus joint exam.  Investigation: No additional findings.  Imaging: No results found.  Recent Labs: Lab Results  Component Value Date   WBC 4.9 03/22/2020   HGB 13.9 03/22/2020   PLT 269 03/22/2020   NA 136 03/22/2020   K 5.2 (H) 03/22/2020   CL 104 03/22/2020   CO2 23 03/22/2020   GLUCOSE 179 (H) 03/22/2020   BUN 13 03/22/2020   CREATININE 0.96 03/22/2020   BILITOT 1.1 03/22/2020   ALKPHOS 82 03/22/2020   AST 53 (H) 03/22/2020   ALT 31 03/22/2020   PROT 6.1 (L) 03/22/2020   ALBUMIN 3.7 03/22/2020   CALCIUM 8.8 (L) 03/22/2020   GFRAA >60 01/26/2020   QFTBGOLDPLUS Negative 06/16/2019    Speciality Comments: Orencia 750mg  every 4 weeks TB gold negative on Jan 2019  Procedures:  No procedures performed Allergies: Codeine, Lipitor [atorvastatin], Invokana [canagliflozin], Losartan, Roxicodone [oxycodone], and Vicodin [hydrocodone-acetaminophen]   Assessment / Plan:     Visit Diagnoses: No diagnosis found.  Orders: No orders of the defined types were placed in this encounter.  No orders of the defined types were placed in this encounter.   Face-to-face time spent with patient was *** minutes. Greater than 50% of time was spent in counseling and coordination of care.  Follow-Up Instructions: No follow-ups on file.   Feb 2019, CMA  Note - This record has been created using Ellen Henri.  Chart creation errors have been sought, but [place] not always  have been located. Such creation errors do not reflect on  the standard of medical care.

## 2020-05-17 NOTE — Progress Notes (Signed)
CBC is normal.  Glucose is elevated.  Calcium is low because of low protein.  AST is elevated most likely due to statin use.  Please forward results to his PCP.

## 2020-05-28 NOTE — Progress Notes (Addendum)
Office Visit Note  Patient: Joel Boulay Montesano Sr.             Date of Birth: [birth year]           MRN: [id number]             PCP: Rennis Golden Referring: Orlena Sheldon, PA-C Visit Date: 06/02/2020 Occupation: _0 @  Subjective:  Stiffness in both hands   History of Present Illness: Joel Schack Berent Sr. is a 75 y.o. male with history of seropositive rheumatoid arthritis and osteoarthritis. He is on Orencia 750 mg IV infusion every 28 days.  His most recent infusion was on 05/17/20. He denies any recent rheumatoid arthritis flares.  He continues to have chronic stiffness and occasional discomfort in both hands.  He typically has to run his hands under hot water in the morning to alleviate the joint stiffness.  He continues to have chronic pain in the right knee which is partially replaced.  He is not ready for a total right knee arthroplasty at this time but [place] consider surgery during the summer.  He states his left knee replacement is doing well.  He denies any other joint pain or joint swelling at this time.  He denies any recent infections.     Activities of Daily Living:  Patient reports joint stiffness all day  Patient Reports nocturnal pain.  Difficulty dressing/grooming: Denies Difficulty climbing stairs: Denies Difficulty getting out of chair: Denies Difficulty using hands for taps, buttons, cutlery, and/or writing: Denies  Review of Systems  Constitutional: Negative for fatigue and night sweats.  HENT: Negative for mouth sores, mouth dryness and nose dryness.   Eyes: Negative for redness and dryness.  Respiratory: Negative for shortness of breath and difficulty breathing.   Cardiovascular: Negative for chest pain, palpitations, hypertension, irregular heartbeat and swelling in legs/feet.  Gastrointestinal: Negative for constipation and diarrhea.  Endocrine: Negative for excessive thirst and increased urination.  Genitourinary: Negative for difficulty urinating and painful  urination.  Musculoskeletal: Positive for arthralgias, joint pain, joint swelling and morning stiffness. Negative for myalgias, muscle weakness, muscle tenderness and myalgias.  Skin: Negative for color change, rash, hair loss, nodules/bumps, skin tightness, ulcers and sensitivity to sunlight.  Allergic/Immunologic: Negative for susceptible to infections.  Neurological: Negative for dizziness, fainting, numbness, memory loss, night sweats and weakness.  Hematological: Negative for bruising/bleeding tendency and swollen glands.  Psychiatric/Behavioral: Positive for sleep disturbance. Negative for depressed mood. The patient is not nervous/anxious.     PMFS History:  Patient Active Problem List   Diagnosis Date Noted  . Tear of left rotator cuff 09/24/2017  . Acute pain of left shoulder 03/12/2017  . Rib pain 03/12/2017  . DDD (degenerative disc disease), cervical 10/31/2016  . Primary osteoarthritis of both hands 10/31/2016  . Primary osteoarthritis of both feet 10/31/2016  . High risk medication use 07/06/2016  . History of diabetes mellitus 07/06/2016  . History of coronary artery disease 07/06/2016  . Rheumatoid arthritis involving multiple sites with positive rheumatoid factor ([place]) 06/02/2016  . Other fatigue 06/02/2016  . Contracture, elbow, left 06/02/2016  . Swelling of left hand 05/01/2016  . Osteoarthritis of right knee 08/27/2015  . S/P TKR (total knee replacement), left 08/27/2015  . Essential hypertension 05/11/2015  . Pain in the chest   . Right groin pain 08/15/2012  . Chest pain 04/02/2012  . Cerebrovascular disease 04/02/2012  . CAD (coronary artery disease) 04/02/2012  . Diabetes mellitus without complication ([place])   .  Dizziness   . Sinus bradycardia   . Hyperlipidemia   . Edema     Past Medical History:  Diagnosis Date  . Anemia   . Arthritis    "all over"  . Bradycardia   . Chronic bronchitis ([place])    "get it just about q yr" (03/17/2014)  . Daily  headache    "here lately" (03/17/2014); relates to sinuses  . GERD (gastroesophageal reflux disease)   . Hard of hearing    hearing aides bilat  . Hiatal hernia   . History of blood transfusion 2008   "related to OR"  . History of kidney stones   . Hyperlipidemia   . Hypertension   . Pneumonia 1972 X 1  . Rheumatoid arthritis ([place])   . Type II diabetes mellitus ([place])   . Wears glasses     Family History  Problem Relation Age of Onset  . Cancer Father        prostate   Past Surgical History:  Procedure Laterality Date  . broken finger      surgical repaired left hand 2nd finger   . cyst removed      per left knee/posteriorly  . CYSTECTOMY Left 2009   "behind knee"  . INGUINAL HERNIA REPAIR Right ?2010  . JOINT REPLACEMENT    . KNEE ARTHROSCOPY Bilateral 1980's  . PARTIAL KNEE ARTHROPLASTY Right 08/27/2015   Procedure: RIGHT UNICOMPARTMENTAL KNEE ARTHROPLASTY;  Surgeon: Mcarthur Rossetti, MD;  Location: [facility];  Service: Orthopedics;  Laterality: Right;  . REVISION TOTAL KNEE ARTHROPLASTY Left 2008  . SHOULDER SURGERY Left 2019  . TOTAL KNEE ARTHROPLASTY Left 2003   Social History   Social History Narrative   Patient lives at home with family.   Caffeine Use: occasionally   Immunization History  Administered Date(s) Administered  . Pneumococcal Conjugate-13 01/13/2014     Objective: Vital Signs: BP 136/69 (BP Location: Left Arm, Patient Position: Sitting, Cuff Size: Normal)   Pulse 78   Resp 18   Ht $R'5\' 6"'Ci$  (1.676 m)   Wt 207 lb (93.9 kg)   BMI 33.41 kg/m    Physical Exam Vitals and nursing note reviewed.  Constitutional:      Appearance: He is well-developed and well-nourished.  HENT:     Head: Normocephalic and atraumatic.  Eyes:     Extraocular Movements: EOM normal.     Conjunctiva/sclera: Conjunctivae normal.     Pupils: Pupils are equal, round, and reactive to light.  Pulmonary:     Effort: Pulmonary effort is normal.  Abdominal:      Palpations: Abdomen is soft.  Musculoskeletal:     Cervical back: Normal range of motion and neck supple.  Skin:    General: Skin is warm and dry.     Capillary Refill: Capillary refill takes less than 2 seconds.  Neurological:     Mental Status: He is alert and oriented to person, place, and time.  Psychiatric:        Mood and Affect: Mood and affect normal.        Behavior: Behavior normal.      Musculoskeletal Exam: C-spine limited ROM with lateral rotation.  Postural thoracic kyphosis.  Shoulder joints, elbow joints, and wrist joints good ROM with no discomfort.  PIP and DIP thickening consistent with osteoarthritis of both hands.  Hip joints good ROM with no discomfort. Right partial knee replacement has good ROM with warmth.  Left knee replacement has good ROM with no discomfort.  Ankle joints good ROM with no discomfort.  No tenderness or inflammation of ankle joints.    CDAI Exam: CDAI Score: 0.4  Patient Global: 2 mm; Provider Global: 2 mm Swollen: 0 ; Tender: 0  Joint Exam 06/02/2020   No joint exam has been documented for this visit   There is currently no information documented on the homunculus. Go to the Rheumatology activity and complete the homunculus joint exam.  Investigation: No additional findings.  Imaging: No results found.  Recent Labs: Lab Results  Component Value Date   WBC 6.7 05/17/2020   HGB 14.0 05/17/2020   PLT 246 05/17/2020   NA 135 05/17/2020   K 5.0 05/17/2020   CL 100 05/17/2020   CO2 24 05/17/2020   GLUCOSE 140 (H) 05/17/2020   BUN 17 05/17/2020   CREATININE 1.08 05/17/2020   BILITOT 0.8 05/17/2020   ALKPHOS 79 05/17/2020   AST 65 (H) 05/17/2020   ALT 29 05/17/2020   PROT 6.3 (L) 05/17/2020   ALBUMIN 3.9 05/17/2020   CALCIUM 8.7 (L) 05/17/2020   GFRAA >60 01/26/2020   QFTBGOLDPLUS Negative 06/16/2019    Speciality Comments: Orencia 722m every 4 weeks TB gold negative on Jan 2019  Procedures:  No procedures  performed Allergies: Codeine, Lipitor [atorvastatin], Invokana [canagliflozin], Losartan, Roxicodone [oxycodone], and Vicodin [hydrocodone-acetaminophen]   Assessment / Plan:     Visit Diagnoses: Rheumatoid arthritis involving multiple sites with positive rheumatoid factor (HCC) -  +RF,+antiCCP, history of elevated ESR: He has no joint tenderness or synovitis on exam.  He has not had any rheumatoid arthritis flares.  He is clinically doing well on Orencia 750 mg IV infusions every 28 days.  His most recent infusion was on 05/17/20.  He experiences intermittent discomfort and persistent stiffness in both hands.  He has PIP and DIP thickening consistent with osteoarthritis of both hands.  We discussed the importance of joint protection and muscle strengthening.  He is not experiencing any other increased joint pain or joint swelling at this time.  He will continue on Orencia IV infusions as monotherapy.  He was advised to notify [place] develops increased joint pain or joint swelling.  He will follow-up in the office in 5 months.  High risk medication use - Orencia 750 mg IV infusions every 28 days (last infusion was on 05/17/20).  Previously discontinued Arava due to elevated LFTs in February 2019 (inadequate response to Humira in the past).  CBC and CMP updated on 05/17/20.  He has lab work drawn with infusions.  TB gold negative on 06/17/19.  Future order for TB gold placed today.   - Plan: QuantiFERON-TB Gold Plus He has not had any recent infections. Discussed the importance of postponing orencia infusions if he develops signs or symptoms of an infection and to resume infusions once the infection has completely cleared. He voiced understanding. He has not received the covid-19 vaccinations and does not plan to at this time.   Primary osteoarthritis of both hands: He has PIP and DIP thickening consistent with osteoarthritis of both hands.  No tenderness or inflammation was noted.  He has morning stiffness every  morning and typically soaks his hands in hot water every morning which alleviates his discomfort.  Joint protection and muscle strengthening were discussed.   Primary osteoarthritis of both feet: He is not experiencing any discomfort in his feet at this time.  He wears proper fitting shoes.   S/P TKR (total knee replacement), left: Doing well.  He  has good ROM with no discomfort at this time.   Status post right partial knee replacement: Chronic pain. He is planning to have a right knee total arthroplasty in the summer.   DDD (degenerative disc disease), cervical: He has limited ROM with lateral rotation bilaterally.   Other fatigue: Stable.   Chronic insomnia: He continues to have difficulty sleeping at night.   Other medical conditions are listed as follows:   History of diabetes mellitus - He is uses an insulin pump.   History of coronary artery disease  History of hyperlipidemia  History of hypertension  Orders: Orders Placed This Encounter  Procedures  . QuantiFERON-TB Gold Plus   No orders of the defined types were placed in this encounter.     Follow-Up Instructions: Return in about 5 months (around 10/31/2020) for Rheumatoid arthritis, Osteoarthritis, DDD.   Ofilia Neas, PA-C  Note - This record has been created using Dragon software.  Chart creation errors have been sought, but [place] not always  have been located. Such creation errors do not reflect on  the standard of medical care.

## 2020-05-31 ENCOUNTER — Ambulatory Visit: Payer: Medicare Other | Admitting: Rheumatology

## 2020-05-31 DIAGNOSIS — Z8639 Personal history of other endocrine, nutritional and metabolic disease: Secondary | ICD-10-CM

## 2020-05-31 DIAGNOSIS — R5383 Other fatigue: Secondary | ICD-10-CM

## 2020-05-31 DIAGNOSIS — F5104 Psychophysiologic insomnia: Secondary | ICD-10-CM

## 2020-05-31 DIAGNOSIS — Z8679 Personal history of other diseases of the circulatory system: Secondary | ICD-10-CM

## 2020-05-31 DIAGNOSIS — M5030 Other cervical disc degeneration, unspecified cervical region: Secondary | ICD-10-CM

## 2020-05-31 DIAGNOSIS — M0579 Rheumatoid arthritis with rheumatoid factor of multiple sites without organ or systems involvement: Secondary | ICD-10-CM

## 2020-05-31 DIAGNOSIS — Z79899 Other long term (current) drug therapy: Secondary | ICD-10-CM

## 2020-05-31 DIAGNOSIS — M19071 Primary osteoarthritis, right ankle and foot: Secondary | ICD-10-CM

## 2020-05-31 DIAGNOSIS — Z96651 Presence of right artificial knee joint: Secondary | ICD-10-CM

## 2020-05-31 DIAGNOSIS — Z96652 Presence of left artificial knee joint: Secondary | ICD-10-CM

## 2020-05-31 DIAGNOSIS — M19041 Primary osteoarthritis, right hand: Secondary | ICD-10-CM

## 2020-06-02 ENCOUNTER — Other Ambulatory Visit: Payer: Self-pay

## 2020-06-02 ENCOUNTER — Ambulatory Visit (INDEPENDENT_AMBULATORY_CARE_PROVIDER_SITE_OTHER): Payer: Medicare Other | Admitting: Physician Assistant

## 2020-06-02 ENCOUNTER — Encounter: Payer: Self-pay | Admitting: Physician Assistant

## 2020-06-02 VITALS — BP 136/69 | HR 78 | Resp 18 | Ht 66.0 in | Wt 207.0 lb

## 2020-06-02 DIAGNOSIS — F5104 Psychophysiologic insomnia: Secondary | ICD-10-CM | POA: Diagnosis not present

## 2020-06-02 DIAGNOSIS — Z96651 Presence of right artificial knee joint: Secondary | ICD-10-CM | POA: Diagnosis not present

## 2020-06-02 DIAGNOSIS — M19042 Primary osteoarthritis, left hand: Secondary | ICD-10-CM

## 2020-06-02 DIAGNOSIS — R5383 Other fatigue: Secondary | ICD-10-CM | POA: Diagnosis not present

## 2020-06-02 DIAGNOSIS — M19072 Primary osteoarthritis, left ankle and foot: Secondary | ICD-10-CM

## 2020-06-02 DIAGNOSIS — M0579 Rheumatoid arthritis with rheumatoid factor of multiple sites without organ or systems involvement: Secondary | ICD-10-CM

## 2020-06-02 DIAGNOSIS — Z79899 Other long term (current) drug therapy: Secondary | ICD-10-CM

## 2020-06-02 DIAGNOSIS — Z96652 Presence of left artificial knee joint: Secondary | ICD-10-CM

## 2020-06-02 DIAGNOSIS — M5030 Other cervical disc degeneration, unspecified cervical region: Secondary | ICD-10-CM | POA: Diagnosis not present

## 2020-06-02 DIAGNOSIS — Z8639 Personal history of other endocrine, nutritional and metabolic disease: Secondary | ICD-10-CM

## 2020-06-02 DIAGNOSIS — Z8679 Personal history of other diseases of the circulatory system: Secondary | ICD-10-CM | POA: Diagnosis not present

## 2020-06-02 DIAGNOSIS — M19041 Primary osteoarthritis, right hand: Secondary | ICD-10-CM

## 2020-06-02 DIAGNOSIS — M19071 Primary osteoarthritis, right ankle and foot: Secondary | ICD-10-CM

## 2020-06-14 ENCOUNTER — Other Ambulatory Visit: Payer: Self-pay

## 2020-06-14 ENCOUNTER — Encounter (HOSPITAL_COMMUNITY)
Admission: RE | Admit: 2020-06-14 | Discharge: 2020-06-14 | Disposition: A | Discharge status: Home / Self Care | Payer: Medicare Other | Source: Ambulatory Visit | Attending: Interventional Radiology | Admitting: Interventional Radiology

## 2020-06-14 DIAGNOSIS — M0579 Rheumatoid arthritis with rheumatoid factor of multiple sites without organ or systems involvement: Secondary | ICD-10-CM | POA: Diagnosis not present

## 2020-06-14 MED ORDER — DIPHENHYDRAMINE HCL 25 MG PO CAPS: 25.0000 mg | ORAL_CAPSULE | ORAL | Status: DC

## 2020-06-14 MED ORDER — SODIUM CHLORIDE 0.9 % IV SOLN
750.0000 mg | INTRAVENOUS | Status: DC
  Administered 2020-06-14: 750 mg via INTRAVENOUS
  Filled 2020-06-14: qty 30

## 2020-07-09 ENCOUNTER — Other Ambulatory Visit: Payer: Self-pay | Admitting: Pharmacist

## 2020-07-09 DIAGNOSIS — M0579 Rheumatoid arthritis with rheumatoid factor of multiple sites without organ or systems involvement: Secondary | ICD-10-CM

## 2020-07-09 DIAGNOSIS — Z79899 Other long term (current) drug therapy: Secondary | ICD-10-CM

## 2020-07-09 NOTE — Progress Notes (Signed)
Next infusion scheduled for Orencia on 07/12/20 and due for updated orders.  Dose: Orencia 750 mg every 28 days (based on last recorded weight of 95.3 kg)  Last Clinic Visit: 06/02/20 Next Clinic Visit: 11/03/20  Last infusion: 06/14/20 Labs: 05/17/20 - CBC wnl - CMP - elevated glucose, AST elevated (likely d/t statin use) TB Gold: 06/15/20 negative  Orders placed for Orencia x 3 doses along with premedication of Tylenol and Benadryl to be administered 30 minutes before medication infusion.  Standing CBC with diff/platelet and CMP with GFR orders placed to be drawn every 2 months.  Next TB gold overdue. Order placed to be drawn with March 2022 infusion  Called patient and provided with phone number for [facility] [phone])  Knox Saliva, PharmD, MPH Clinical Pharmacist (Rheumatology and Pulmonology)

## 2020-07-12 ENCOUNTER — Ambulatory Visit (HOSPITAL_COMMUNITY)
Admission: RE | Admit: 2020-07-12 | Discharge: 2020-07-12 | Disposition: A | Discharge status: Home / Self Care | Payer: Medicare Other | Source: Ambulatory Visit | Attending: Rheumatology | Admitting: Rheumatology

## 2020-07-12 ENCOUNTER — Other Ambulatory Visit: Payer: Self-pay

## 2020-07-12 DIAGNOSIS — M0579 Rheumatoid arthritis with rheumatoid factor of multiple sites without organ or systems involvement: Secondary | ICD-10-CM | POA: Diagnosis not present

## 2020-07-12 DIAGNOSIS — Z79899 Other long term (current) drug therapy: Secondary | ICD-10-CM | POA: Insufficient documentation

## 2020-07-12 MED ORDER — SODIUM CHLORIDE 0.9 % IV SOLN
750.0000 mg | INTRAVENOUS | Status: DC
  Administered 2020-07-12: 750 mg via INTRAVENOUS
  Filled 2020-07-12: qty 30

## 2020-07-12 MED ORDER — ACETAMINOPHEN 325 MG PO TABS: 650.0000 mg | ORAL_TABLET | ORAL | Status: DC

## 2020-07-12 MED ORDER — DIPHENHYDRAMINE HCL 25 MG PO CAPS: 25.0000 mg | ORAL_CAPSULE | ORAL | Status: DC

## 2020-07-14 DIAGNOSIS — E1122 Type 2 diabetes mellitus with diabetic chronic kidney disease: Secondary | ICD-10-CM | POA: Diagnosis not present

## 2020-07-14 LAB — QUANTIFERON-TB GOLD PLUS (RQFGPL)
QuantiFERON Mitogen Value: >10 IU/mL
QuantiFERON Nil Value: 0.12 IU/mL
QuantiFERON TB1 Ag Value: 0.15 IU/mL
QuantiFERON TB2 Ag Value: 0.09 IU/mL

## 2020-07-14 LAB — QUANTIFERON-TB GOLD PLUS: QuantiFERON-TB Gold Plus: NEGATIVE

## 2020-07-14 NOTE — Progress Notes (Signed)
TB gold negative

## 2020-08-02 DIAGNOSIS — I10 Essential (primary) hypertension: Secondary | ICD-10-CM | POA: Diagnosis not present

## 2020-08-02 DIAGNOSIS — E7800 Pure hypercholesterolemia, unspecified: Secondary | ICD-10-CM | POA: Diagnosis not present

## 2020-08-06 DIAGNOSIS — Z794 Long term (current) use of insulin: Secondary | ICD-10-CM | POA: Diagnosis not present

## 2020-08-06 DIAGNOSIS — N182 Chronic kidney disease, stage 2 (mild): Secondary | ICD-10-CM | POA: Diagnosis not present

## 2020-08-06 DIAGNOSIS — I10 Essential (primary) hypertension: Secondary | ICD-10-CM | POA: Diagnosis not present

## 2020-08-06 DIAGNOSIS — E1165 Type 2 diabetes mellitus with hyperglycemia: Secondary | ICD-10-CM | POA: Diagnosis not present

## 2020-08-06 DIAGNOSIS — E1122 Type 2 diabetes mellitus with diabetic chronic kidney disease: Secondary | ICD-10-CM | POA: Diagnosis not present

## 2020-08-09 ENCOUNTER — Ambulatory Visit (HOSPITAL_COMMUNITY)
Admission: RE | Admit: 2020-08-09 | Discharge: 2020-08-09 | Disposition: A | Discharge status: Home / Self Care | Payer: Medicare Other | Source: Ambulatory Visit | Attending: Rheumatology | Admitting: Rheumatology

## 2020-08-09 ENCOUNTER — Other Ambulatory Visit: Payer: Self-pay

## 2020-08-09 DIAGNOSIS — Z79899 Other long term (current) drug therapy: Secondary | ICD-10-CM | POA: Diagnosis not present

## 2020-08-09 DIAGNOSIS — M0579 Rheumatoid arthritis with rheumatoid factor of multiple sites without organ or systems involvement: Secondary | ICD-10-CM | POA: Diagnosis not present

## 2020-08-09 LAB — COMPREHENSIVE METABOLIC PANEL
ALT: 23 U/L (ref 0–44)
AST: 54 U/L — ABNORMAL HIGH (ref 15–41)
Albumin: 3.6 g/dL (ref 3.5–5.0)
Alkaline Phosphatase: 70 U/L (ref 38–126)
Anion gap: 4 — ABNORMAL LOW (ref 5–15)
BUN: 19 mg/dL (ref 8–23)
CO2: 26 mmol/L (ref 22–32)
Calcium: 8.9 mg/dL (ref 8.9–10.3)
Chloride: 108 mmol/L (ref 98–111)
Creatinine, Ser: 1.04 mg/dL (ref 0.61–1.24)
GFR, Estimated: >60 mL/min (ref >60)
Glucose, Bld: 125 mg/dL — ABNORMAL HIGH (ref 70–99)
Potassium: 4.6 mmol/L (ref 3.5–5.1)
Sodium: 138 mmol/L (ref 135–145)
Total Bilirubin: 1.2 mg/dL (ref 0.3–1.2)
Total Protein: 5.8 g/dL — ABNORMAL LOW (ref 6.5–8.1)

## 2020-08-09 LAB — CBC WITH DIFFERENTIAL/PLATELET
Abs Immature Granulocytes: 0.01 10*3/uL (ref 0.00–0.07)
Basophils Absolute: 0.1 10*3/uL (ref 0.0–0.1)
Basophils Relative: 1 %
Eosinophils Absolute: 0.3 10*3/uL (ref 0.0–0.5)
Eosinophils Relative: 5 %
HCT: 39.2 % (ref 39.0–52.0)
Hemoglobin: 13.8 g/dL (ref 13.0–17.0)
Immature Granulocytes: 0 %
Lymphocytes Relative: 38 %
Lymphs Abs: 2.3 10*3/uL (ref 0.7–4.0)
MCH: 31.5 pg (ref 26.0–34.0)
MCHC: 35.2 g/dL (ref 30.0–36.0)
MCV: 89.5 fL (ref 80.0–100.0)
Monocytes Absolute: 0.6 10*3/uL (ref 0.1–1.0)
Monocytes Relative: 11 %
Neutro Abs: 2.7 10*3/uL (ref 1.7–7.7)
Neutrophils Relative %: 45 %
Platelets: 252 10*3/uL (ref 150–400)
RBC: 4.38 MIL/uL (ref 4.22–5.81)
RDW: 13.2 % (ref 11.5–15.5)
WBC: 6 10*3/uL (ref 4.0–10.5)
nRBC: 0 % (ref 0.0–0.2)

## 2020-08-09 MED ORDER — ACETAMINOPHEN 325 MG PO TABS: 650.0000 mg | ORAL_TABLET | ORAL | Status: DC

## 2020-08-09 MED ORDER — DIPHENHYDRAMINE HCL 25 MG PO CAPS: 25.0000 mg | ORAL_CAPSULE | ORAL | Status: DC

## 2020-08-09 MED ORDER — SODIUM CHLORIDE 0.9 % IV SOLN
750.0000 mg | INTRAVENOUS | Status: DC
  Administered 2020-08-09: 750 mg via INTRAVENOUS
  Filled 2020-08-09: qty 30

## 2020-08-09 NOTE — Progress Notes (Signed)
CMP stable. Total protein remains low.  AST remains elevated but has improved slightly.  ALT WNL. He remains on statin therapy.  Please advise the patient to avoid tylenol, NSAIDs, and alcohol use.   Please forward lab work to PCP. CBC WNL.

## 2020-09-06 ENCOUNTER — Ambulatory Visit (HOSPITAL_COMMUNITY)
Admission: RE | Admit: 2020-09-06 | Discharge: 2020-09-06 | Disposition: A | Discharge status: Home / Self Care | Payer: Medicare Other | Source: Ambulatory Visit | Attending: Rheumatology | Admitting: Rheumatology

## 2020-09-06 ENCOUNTER — Other Ambulatory Visit: Payer: Self-pay

## 2020-09-06 DIAGNOSIS — M0579 Rheumatoid arthritis with rheumatoid factor of multiple sites without organ or systems involvement: Secondary | ICD-10-CM | POA: Insufficient documentation

## 2020-09-06 MED ORDER — DIPHENHYDRAMINE HCL 25 MG PO CAPS: 25.0000 mg | ORAL_CAPSULE | ORAL | Status: DC

## 2020-09-06 MED ORDER — ACETAMINOPHEN 325 MG PO TABS: 650.0000 mg | ORAL_TABLET | ORAL | Status: DC

## 2020-09-06 MED ORDER — SODIUM CHLORIDE 0.9 % IV SOLN
750.0000 mg | INTRAVENOUS | Status: DC
  Administered 2020-09-06: 750 mg via INTRAVENOUS
  Filled 2020-09-06: qty 30

## 2020-09-10 ENCOUNTER — Other Ambulatory Visit: Payer: Self-pay | Admitting: Pharmacist

## 2020-09-10 DIAGNOSIS — M0579 Rheumatoid arthritis with rheumatoid factor of multiple sites without organ or systems involvement: Secondary | ICD-10-CM

## 2020-09-10 DIAGNOSIS — Z79899 Other long term (current) drug therapy: Secondary | ICD-10-CM

## 2020-09-10 NOTE — Progress Notes (Signed)
Next infusion scheduled for Orencia on 07/12/20 and due for updated orders.  Dose: Orencia 750 mg every 28 days (based on last recorded weight of 95.3 kg)  Last Clinic Visit: 06/02/20 Next Clinic Visit: 11/03/20  Last infusion: 09/06/20 Labs: 08/09/20 - CBC wnl - CMP stable - AST elevated but slightly improved. ALT wnl TB Gold: 07/12/20 negative  Orders placed for Orencia x 3 doses along with premedication of Tylenol and Benadryl to be administered 30 minutes before medication infusion.  Standing CBC with diff/platelet and CMP with GFR orders placed to be drawn every 2 months.  Next TB gold due Feb 2023.   Called patient and provided with phone number for [facility] Medical Day [phone])  Knox Saliva, PharmD, MPH Clinical Pharmacist (Rheumatology and Pulmonology)

## 2020-10-04 ENCOUNTER — Ambulatory Visit (HOSPITAL_COMMUNITY)
Admission: RE | Admit: 2020-10-04 | Discharge: 2020-10-04 | Disposition: A | Discharge status: Home / Self Care | Payer: Medicare Other | Source: Ambulatory Visit | Attending: Rheumatology | Admitting: Rheumatology

## 2020-10-04 ENCOUNTER — Other Ambulatory Visit: Payer: Self-pay

## 2020-10-04 DIAGNOSIS — Z79899 Other long term (current) drug therapy: Secondary | ICD-10-CM | POA: Diagnosis not present

## 2020-10-04 DIAGNOSIS — M0579 Rheumatoid arthritis with rheumatoid factor of multiple sites without organ or systems involvement: Secondary | ICD-10-CM | POA: Insufficient documentation

## 2020-10-04 LAB — COMPREHENSIVE METABOLIC PANEL
ALT: 25 U/L (ref 0–44)
AST: 25 U/L (ref 15–41)
Albumin: 3.8 g/dL (ref 3.5–5.0)
Alkaline Phosphatase: 93 U/L (ref 38–126)
Anion gap: 5 (ref 5–15)
BUN: 16 mg/dL (ref 8–23)
CO2: 26 mmol/L (ref 22–32)
Calcium: 9 mg/dL (ref 8.9–10.3)
Chloride: 107 mmol/L (ref 98–111)
Creatinine, Ser: 1.05 mg/dL (ref 0.61–1.24)
GFR, Estimated: >60 mL/min (ref >60)
Glucose, Bld: 79 mg/dL (ref 70–99)
Potassium: 3.9 mmol/L (ref 3.5–5.1)
Sodium: 138 mmol/L (ref 135–145)
Total Bilirubin: 0.8 mg/dL (ref 0.3–1.2)
Total Protein: 6.4 g/dL — ABNORMAL LOW (ref 6.5–8.1)

## 2020-10-04 LAB — CBC WITH DIFFERENTIAL/PLATELET
Abs Immature Granulocytes: 0.01 10*3/uL (ref 0.00–0.07)
Basophils Absolute: 0.1 10*3/uL (ref 0.0–0.1)
Basophils Relative: 1 %
Eosinophils Absolute: 0.3 10*3/uL (ref 0.0–0.5)
Eosinophils Relative: 4 %
HCT: 42.9 % (ref 39.0–52.0)
Hemoglobin: 14.5 g/dL (ref 13.0–17.0)
Immature Granulocytes: 0 %
Lymphocytes Relative: 40 %
Lymphs Abs: 2.3 10*3/uL (ref 0.7–4.0)
MCH: 30.7 pg (ref 26.0–34.0)
MCHC: 33.8 g/dL (ref 30.0–36.0)
MCV: 90.9 fL (ref 80.0–100.0)
Monocytes Absolute: 0.7 10*3/uL (ref 0.1–1.0)
Monocytes Relative: 12 %
Neutro Abs: 2.4 10*3/uL (ref 1.7–7.7)
Neutrophils Relative %: 43 %
Platelets: 261 10*3/uL (ref 150–400)
RBC: 4.72 MIL/uL (ref 4.22–5.81)
RDW: 12.7 % (ref 11.5–15.5)
WBC: 5.8 10*3/uL (ref 4.0–10.5)
nRBC: 0 % (ref 0.0–0.2)

## 2020-10-04 MED ORDER — SODIUM CHLORIDE 0.9 % IV SOLN
750.0000 mg | INTRAVENOUS | Status: DC
  Administered 2020-10-04: 750 mg via INTRAVENOUS
  Filled 2020-10-04: qty 30

## 2020-10-04 MED ORDER — ACETAMINOPHEN 325 MG PO TABS: 650.0000 mg | ORAL_TABLET | ORAL | Status: DC

## 2020-10-04 MED ORDER — DIPHENHYDRAMINE HCL 25 MG PO CAPS: 25.0000 mg | ORAL_CAPSULE | ORAL | Status: DC

## 2020-10-04 NOTE — Progress Notes (Signed)
CBC and CMP are stable.

## 2020-10-20 NOTE — Progress Notes (Signed)
Office Visit Note  Patient: Joel Utz Lopiccolo Sr.             Date of Birth: 08-Jun-1945           MRN: [id number]             PCP: Scheryl Marten, PA Referring: Orlena Sheldon, PA-C Visit Date: 11/03/2020 Occupation: '@GUAROCC' @  Subjective:  Other (Bilateral hand pain and right great toe pain )   History of Present Illness: Joel Caspers Giles Sr. is a 75 y.o. male with history of rheumatoid arthritis and osteoarthritis overlap.  He has been on Orencia infusions on a regular basis which he has been tolerating well.  He continues to have some stiffness in his hands and also has been experiencing some discomfort in his right great toe.  His knee replacements are doing well although his right knee partial replacement causes some discomfort.  He denies any discomfort in his cervical spine.  Activities of Daily Living:  Patient reports morning stiffness for all day. Patient Reports nocturnal pain.  Difficulty dressing/grooming: Denies Difficulty climbing stairs: Denies Difficulty getting out of chair: Denies Difficulty using hands for taps, buttons, cutlery, and/or writing: Reports  Review of Systems  Constitutional:  Negative for fatigue.  HENT:  Negative for mouth sores, mouth dryness and nose dryness.   Eyes:  Positive for itching. Negative for pain and dryness.  Respiratory:  Negative for shortness of breath and difficulty breathing.   Cardiovascular:  Negative for chest pain and palpitations.  Gastrointestinal:  Negative for blood in stool, constipation and diarrhea.  Endocrine: Negative for increased urination.  Genitourinary:  Negative for difficulty urinating.  Musculoskeletal:  Positive for joint pain, joint pain, joint swelling and morning stiffness. Negative for myalgias, muscle tenderness and myalgias.  Skin:  Negative for color change, rash and redness.  Allergic/Immunologic: Negative for susceptible to infections.  Neurological:  Positive for headaches. Negative for dizziness,  numbness, memory loss and weakness.  Hematological:  Negative for bruising/bleeding tendency.  Psychiatric/Behavioral:  Negative for confusion.    PMFS History:  Patient Active Problem List   Diagnosis Date Noted   Tear of left rotator cuff 09/24/2017   Acute pain of left shoulder 03/12/2017   Rib pain 03/12/2017   DDD (degenerative disc disease), cervical 10/31/2016   Primary osteoarthritis of both hands 10/31/2016   Primary osteoarthritis of both feet 10/31/2016   High risk medication use 07/06/2016   History of diabetes mellitus 07/06/2016   History of coronary artery disease 07/06/2016   Rheumatoid arthritis involving multiple sites with positive rheumatoid factor ([place]) 06/02/2016   Other fatigue 06/02/2016   Contracture, elbow, left 06/02/2016   Swelling of left hand 05/01/2016   Osteoarthritis of right knee 08/27/2015   S/P TKR (total knee replacement), left 08/27/2015   Essential hypertension 05/11/2015   Pain in the chest    Right groin pain 08/15/2012   Chest pain 04/02/2012   Cerebrovascular disease 04/02/2012   CAD (coronary artery disease) 04/02/2012   Diabetes mellitus without complication ([place])    Dizziness    Sinus bradycardia    Hyperlipidemia    Edema     Past Medical History:  Diagnosis Date   Anemia    Arthritis    "all over"   Bradycardia    Chronic bronchitis ([place])    "get it just about q yr" (03/17/2014)   Daily headache    "here lately" (03/17/2014); relates to sinuses   GERD (gastroesophageal reflux disease)  Hard of hearing    hearing aides bilat   Hiatal hernia    History of blood transfusion 2008   "related to OR"   History of kidney stones    Hyperlipidemia    Hypertension    Pneumonia 1972 X 1   Rheumatoid arthritis ([place])    Type II diabetes mellitus ([place])    Wears glasses     Family History  Problem Relation Age of Onset   Cancer Father        prostate   Past Surgical History:  Procedure Laterality Date   broken finger       surgical repaired left hand 2nd finger    cyst removed      per left knee/posteriorly   CYSTECTOMY Left 2009   "behind knee"   INGUINAL HERNIA REPAIR Right ?2010   JOINT REPLACEMENT     KNEE ARTHROSCOPY Bilateral 1980's   PARTIAL KNEE ARTHROPLASTY Right 08/27/2015   Procedure: RIGHT UNICOMPARTMENTAL KNEE ARTHROPLASTY;  Surgeon: Mcarthur Rossetti, MD;  Location: [facility];  Service: Orthopedics;  Laterality: Right;   REVISION TOTAL KNEE ARTHROPLASTY Left 2008   SHOULDER SURGERY Left 2019   TOTAL KNEE ARTHROPLASTY Left 2003   Social History   Social History Narrative   Patient lives at home with family.   Caffeine Use: occasionally   Immunization History  Administered Date(s) Administered   Pneumococcal Conjugate-13 01/13/2014     Objective: Vital Signs: BP (!) 151/80 (BP Location: Left Arm, Patient Position: Sitting, Cuff Size: Normal)   Pulse (!) 44   Ht '5\' 6"'  (1.676 m)   Wt 209 lb 9.6 oz (95.1 kg)   BMI 33.83 kg/m    Physical Exam Vitals and nursing note reviewed.  Constitutional:      Appearance: He is well-developed.  HENT:     Head: Normocephalic and atraumatic.  Eyes:     Conjunctiva/sclera: Conjunctivae normal.     Pupils: Pupils are equal, round, and reactive to light.  Cardiovascular:     Rate and Rhythm: Normal rate and regular rhythm.     Heart sounds: Normal heart sounds.  Pulmonary:     Effort: Pulmonary effort is normal.     Breath sounds: Normal breath sounds.  Abdominal:     General: Bowel sounds are normal.     Palpations: Abdomen is soft.  Musculoskeletal:     Cervical back: Normal range of motion and neck supple.  Skin:    General: Skin is warm and dry.     Capillary Refill: Capillary refill takes less than 2 seconds.  Neurological:     Mental Status: He is alert and oriented to person, place, and time.  Psychiatric:        Behavior: Behavior normal.     Musculoskeletal Exam: C-spine had limited range of motion.  Shoulder joints,  elbow joints, wrist joints with good range of motion.  No synovitis of the wrist joint or MCP joints.  PIP and DIP thickening was noted.  Hip joints and knee joints with good range of motion.  Bilateral knee joints are replaced.  He had right great toe thickening but no warmth or swelling was noted.  Osteoarthritic changes were noted in the feet.  CDAI Exam: CDAI Score: 0.4  Patient Global: 2 mm; Provider Global: 2 mm Swollen: 0 ; Tender: 0  Joint Exam 11/03/2020   No joint exam has been documented for this visit   There is currently no information documented on the homunculus. Go to  the Rheumatology activity and complete the homunculus joint exam.  Investigation: No additional findings.  Imaging: No results found.  Recent Labs: Lab Results  Component Value Date   WBC 5.8 10/04/2020   HGB 14.5 10/04/2020   PLT 261 10/04/2020   NA 138 10/04/2020   K 3.9 10/04/2020   CL 107 10/04/2020   CO2 26 10/04/2020   GLUCOSE 79 10/04/2020   BUN 16 10/04/2020   CREATININE 1.05 10/04/2020   BILITOT 0.8 10/04/2020   ALKPHOS 93 10/04/2020   AST 25 10/04/2020   ALT 25 10/04/2020   PROT 6.4 (L) 10/04/2020   ALBUMIN 3.8 10/04/2020   CALCIUM 9.0 10/04/2020   GFRAA >60 01/26/2020   QFTBGOLDPLUS Negative 07/12/2020    Speciality Comments: Orencia 713m every 4 weeks TB gold negative on Jan 2019  Procedures:  No procedures performed Allergies: Codeine, Lipitor [atorvastatin], Invokana [canagliflozin], Losartan, Roxicodone [oxycodone], and Vicodin [hydrocodone-acetaminophen]   Assessment / Plan:     Visit Diagnoses: Rheumatoid arthritis involving multiple sites with positive rheumatoid factor ([place] - +RF,+antiCCP, history of elevated ESR: Previously discontinued Arava due to elevated LFTs in February 2019 (inadequate response to Humira in the past).  He is clinically doing well on Orencia infusions.  He had no synovitis on my examination today.  High risk medication use - Orencia 750 mg IV  infusions every 28 days.  Labs from [place] 2022 were normal.  He will get labs with his infusions.  TB gold was negative on July 12, 2020.  He does not want to be immunized against COVID-19.  I have given him instructions regarding all the vaccinations which are due.  He will discuss that with his PCP.  He has been advised to stop Orencia infusion in case he develops an infection.  He [place] resume Orencia infusions once the infection resolves.  Primary osteoarthritis of both hands-he has severe osteoarthritis involving PIP and DIP joints.  Joint protection muscle strengthening was discussed.  Primary osteoarthritis of both feet-he complains of increased discomfort in his feet.  He has right first MTP thickening which is causing discomfort.  Right great toe pain-he has been having discomfort in the right great toe.  No warmth swelling or effusion was noted.  He has thickening of the first MTP joint.  The findings are consistent with osteoarthritis.  Proper fitting shoes were discussed.  S/P TKR (total knee replacement), left-doing well.  Status post right partial knee replacement-he has some stiffness in his right knee joint.  DDD (degenerative disc disease), cervical-he had limited range of motion of his cervical spine without discomfort.  Other medical problems are listed as follows:  Other fatigue  Chronic insomnia  History of coronary artery disease-increased risk of heart disease with autoimmune disease was also discussed.  Need for regular exercise and dietary modifications were discussed.  History of diabetes mellitus - He uses an insulin pump.   History of hypertension  History of hyperlipidemia  Orders: No orders of the defined types were placed in this encounter.  No orders of the defined types were placed in this encounter.   Follow-Up Instructions: Return in about 5 months (around 04/05/2021) for Rheumatoid arthritis.   SBo Merino MD  Note - This record has  been created using DEditor, commissioning  Chart creation errors have been sought, but [place] not always  have been located. Such creation errors do not reflect on  the standard of medical care.

## 2020-11-01 ENCOUNTER — Ambulatory Visit (HOSPITAL_COMMUNITY)
Admission: RE | Admit: 2020-11-01 | Discharge: 2020-11-01 | Disposition: A | Discharge status: Home / Self Care | Payer: Medicare Other | Source: Ambulatory Visit | Attending: Rheumatology | Admitting: Rheumatology

## 2020-11-01 ENCOUNTER — Other Ambulatory Visit: Payer: Self-pay

## 2020-11-01 DIAGNOSIS — M0579 Rheumatoid arthritis with rheumatoid factor of multiple sites without organ or systems involvement: Secondary | ICD-10-CM

## 2020-11-01 MED ORDER — ABATACEPT 250 MG IV SOLR
750.0000 mg | INTRAVENOUS | Status: DC
  Administered 2020-11-01: 750 mg via INTRAVENOUS
  Filled 2020-11-01: qty 30

## 2020-11-01 MED ORDER — ACETAMINOPHEN 325 MG PO TABS
650.0000 mg | ORAL_TABLET | ORAL | Status: DC
Start: 2020-11-01 — End: 2020-11-02

## 2020-11-01 MED ORDER — DIPHENHYDRAMINE HCL 25 MG PO CAPS
25.0000 mg | ORAL_CAPSULE | ORAL | Status: DC
Start: 2020-11-01 — End: 2020-11-02

## 2020-11-03 ENCOUNTER — Ambulatory Visit (INDEPENDENT_AMBULATORY_CARE_PROVIDER_SITE_OTHER): Payer: Medicare Other | Admitting: Rheumatology

## 2020-11-03 ENCOUNTER — Other Ambulatory Visit: Payer: Self-pay

## 2020-11-03 ENCOUNTER — Encounter: Payer: Self-pay | Admitting: Rheumatology

## 2020-11-03 VITALS — BP 151/80 | HR 44 | Ht 66.0 in | Wt 209.6 lb

## 2020-11-03 DIAGNOSIS — M19071 Primary osteoarthritis, right ankle and foot: Secondary | ICD-10-CM | POA: Diagnosis not present

## 2020-11-03 DIAGNOSIS — M19041 Primary osteoarthritis, right hand: Secondary | ICD-10-CM | POA: Diagnosis not present

## 2020-11-03 DIAGNOSIS — M5030 Other cervical disc degeneration, unspecified cervical region: Secondary | ICD-10-CM | POA: Diagnosis not present

## 2020-11-03 DIAGNOSIS — R5383 Other fatigue: Secondary | ICD-10-CM | POA: Diagnosis not present

## 2020-11-03 DIAGNOSIS — Z96652 Presence of left artificial knee joint: Secondary | ICD-10-CM | POA: Diagnosis not present

## 2020-11-03 DIAGNOSIS — Z8639 Personal history of other endocrine, nutritional and metabolic disease: Secondary | ICD-10-CM

## 2020-11-03 DIAGNOSIS — Z96651 Presence of right artificial knee joint: Secondary | ICD-10-CM

## 2020-11-03 DIAGNOSIS — F5104 Psychophysiologic insomnia: Secondary | ICD-10-CM | POA: Diagnosis not present

## 2020-11-03 DIAGNOSIS — Z79899 Other long term (current) drug therapy: Secondary | ICD-10-CM

## 2020-11-03 DIAGNOSIS — Z8679 Personal history of other diseases of the circulatory system: Secondary | ICD-10-CM

## 2020-11-03 DIAGNOSIS — M79674 Pain in right toe(s): Secondary | ICD-10-CM

## 2020-11-03 DIAGNOSIS — M0579 Rheumatoid arthritis with rheumatoid factor of multiple sites without organ or systems involvement: Secondary | ICD-10-CM | POA: Diagnosis not present

## 2020-11-03 DIAGNOSIS — M19042 Primary osteoarthritis, left hand: Secondary | ICD-10-CM

## 2020-11-03 DIAGNOSIS — M19072 Primary osteoarthritis, left ankle and foot: Secondary | ICD-10-CM

## 2020-11-03 NOTE — Patient Instructions (Signed)
Vaccines You are taking a medication(s) that can suppress your immune system.  The following immunizations are recommended: Flu annually Covid-19  Td/Tdap (tetanus, diphtheria, pertussis) every 10 years Pneumonia (Prevnar 15 then Pneumovax 23 at least 1 year apart.  Alternatively, can take Prevnar 20 without needing additional dose) Shingrix (after age 74): 2 doses from 4 weeks to 6 months apart  Please check with your PCP to make sure you are up to date.   If you test POSITIVE for COVID19 and have MILD to MODERATE symptoms: First, call your PCP if you would like to receive COVID19 treatment AND Hold your medications during the infection and for at least 1 week after your symptoms have resolved: Injectable medication (Benlysta, Cimzia, Cosentyx, Enbrel, Humira, Orencia, Remicade, Simponi, Stelara, Taltz, Tremfya) Methotrexate Leflunomide (Arava) Mycophenolate (Cellcept) Morrie Sheldon, Olumiant, or Rinvoq If you take Actemra or Kevzara, you DO NOT need to hold these for COVID19 infection.  If you test POSITIVE for COVID19 and have NO symptoms: First, call your PCP if you would like to receive COVID19 treatment AND Hold your medications for at least 10 days after the day that you tested positive Injectable medication (Benlysta, Cimzia, Cosentyx, Enbrel, Humira, Orencia, Remicade, Simponi, Stelara, Taltz, Tremfya) Methotrexate Leflunomide (Arava) Mycophenolate (Cellcept) Morrie Sheldon, Olumiant, or Rinvoq If you take Actemra or Kevzara, you DO NOT need to hold these for COVID19 infection.  If you have signs or symptoms of an infection or start antibiotics: First, call your PCP for workup of your infection. Hold your medication through the infection, until you complete your antibiotics, and until symptoms resolve if you take the following: Injectable medication (Actemra, Benlysta, Cimzia, Cosentyx, Enbrel, Humira, Kevzara, Orencia, Remicade, Simponi, Stelara, Taltz,  Tremfya) Methotrexate Leflunomide (Arava) Mycophenolate (Cellcept) Morrie Sheldon, Olumiant, or Rinvoq  Heart Disease Prevention   Your inflammatory disease increases your risk of heart disease which includes heart attack, stroke, atrial fibrillation (irregular heartbeats), high blood pressure, heart failure and atherosclerosis (plaque in the arteries).  It is important to reduce your risk by:   Keep blood pressure, cholesterol, and blood sugar at healthy levels   Smoking Cessation   Maintain a healthy weight  BMI 20-25   Eat a healthy diet  Plenty of fresh fruit, vegetables, and whole grains  Limit saturated fats, foods high in sodium, and added sugars  DASH and Mediterranean diet   Increase physical activity  Recommend moderate physically activity for 150 minutes per week/ 30 minutes a day for five days a week These can be broken up into three separate ten-minute sessions during the day.   Reduce Stress  Meditation, slow breathing exercises, yoga, coloring books  Dental visits twice a year

## 2020-11-05 DIAGNOSIS — E1122 Type 2 diabetes mellitus with diabetic chronic kidney disease: Secondary | ICD-10-CM | POA: Diagnosis not present

## 2020-11-05 DIAGNOSIS — I10 Essential (primary) hypertension: Secondary | ICD-10-CM | POA: Diagnosis not present

## 2020-11-05 DIAGNOSIS — E1165 Type 2 diabetes mellitus with hyperglycemia: Secondary | ICD-10-CM | POA: Diagnosis not present

## 2020-11-05 DIAGNOSIS — N182 Chronic kidney disease, stage 2 (mild): Secondary | ICD-10-CM | POA: Diagnosis not present

## 2020-11-05 DIAGNOSIS — Z794 Long term (current) use of insulin: Secondary | ICD-10-CM | POA: Diagnosis not present

## 2020-11-29 ENCOUNTER — Ambulatory Visit (HOSPITAL_COMMUNITY)
Admission: RE | Admit: 2020-11-29 | Discharge: 2020-11-29 | Disposition: A | Discharge status: Home / Self Care | Payer: Medicare Other | Source: Ambulatory Visit | Attending: Rheumatology | Admitting: Rheumatology

## 2020-11-29 ENCOUNTER — Other Ambulatory Visit: Payer: Self-pay

## 2020-11-29 DIAGNOSIS — Z79899 Other long term (current) drug therapy: Secondary | ICD-10-CM | POA: Insufficient documentation

## 2020-11-29 DIAGNOSIS — M0579 Rheumatoid arthritis with rheumatoid factor of multiple sites without organ or systems involvement: Secondary | ICD-10-CM | POA: Insufficient documentation

## 2020-11-29 LAB — CBC WITH DIFFERENTIAL/PLATELET
Abs Immature Granulocytes: 0.02 10*3/uL (ref 0.00–0.07)
Basophils Absolute: 0.1 10*3/uL (ref 0.0–0.1)
Basophils Relative: 1 %
Eosinophils Absolute: 0.2 10*3/uL (ref 0.0–0.5)
Eosinophils Relative: 4 %
HCT: 39.6 % (ref 39.0–52.0)
Hemoglobin: 13.5 g/dL (ref 13.0–17.0)
Immature Granulocytes: 0 %
Lymphocytes Relative: 30 %
Lymphs Abs: 1.6 10*3/uL (ref 0.7–4.0)
MCH: 30.8 pg (ref 26.0–34.0)
MCHC: 34.1 g/dL (ref 30.0–36.0)
MCV: 90.2 fL (ref 80.0–100.0)
Monocytes Absolute: 0.6 10*3/uL (ref 0.1–1.0)
Monocytes Relative: 12 %
Neutro Abs: 2.9 10*3/uL (ref 1.7–7.7)
Neutrophils Relative %: 53 %
Platelets: 249 10*3/uL (ref 150–400)
RBC: 4.39 MIL/uL (ref 4.22–5.81)
RDW: 13.2 % (ref 11.5–15.5)
WBC: 5.4 10*3/uL (ref 4.0–10.5)
nRBC: 0 % (ref 0.0–0.2)

## 2020-11-29 LAB — COMPREHENSIVE METABOLIC PANEL
ALT: 27 U/L (ref 0–44)
AST: 39 U/L (ref 15–41)
Albumin: 3.7 g/dL (ref 3.5–5.0)
Alkaline Phosphatase: 101 U/L (ref 38–126)
Anion gap: 6 (ref 5–15)
BUN: 13 mg/dL (ref 8–23)
CO2: 27 mmol/L (ref 22–32)
Calcium: 8.6 mg/dL — ABNORMAL LOW (ref 8.9–10.3)
Chloride: 101 mmol/L (ref 98–111)
Creatinine, Ser: 1.11 mg/dL (ref 0.61–1.24)
GFR, Estimated: >60 mL/min (ref >60)
Glucose, Bld: 284 mg/dL — ABNORMAL HIGH (ref 70–99)
Potassium: 4.4 mmol/L (ref 3.5–5.1)
Sodium: 134 mmol/L — ABNORMAL LOW (ref 135–145)
Total Bilirubin: 1 mg/dL (ref 0.3–1.2)
Total Protein: 6.2 g/dL — ABNORMAL LOW (ref 6.5–8.1)

## 2020-11-29 MED ORDER — ABATACEPT 250 MG IV SOLR
750.0000 mg | INTRAVENOUS | Status: DC
  Administered 2020-11-29: 750 mg via INTRAVENOUS
  Filled 2020-11-29: qty 30

## 2020-11-29 MED ORDER — ACETAMINOPHEN 325 MG PO TABS: 650.0000 mg | ORAL_TABLET | ORAL | Status: DC

## 2020-11-29 MED ORDER — DIPHENHYDRAMINE HCL 25 MG PO CAPS: 25.0000 mg | ORAL_CAPSULE | ORAL | Status: DC

## 2020-11-29 NOTE — Progress Notes (Signed)
Glucose is elevated.  Calcium is low because protein is decreased.  CBC is normal.  Please notify patient and forward results to his PCP.

## 2020-12-10 ENCOUNTER — Other Ambulatory Visit: Payer: Self-pay | Admitting: Pharmacist

## 2020-12-10 DIAGNOSIS — Z79899 Other long term (current) drug therapy: Secondary | ICD-10-CM

## 2020-12-10 DIAGNOSIS — M0579 Rheumatoid arthritis with rheumatoid factor of multiple sites without organ or systems involvement: Secondary | ICD-10-CM

## 2020-12-10 NOTE — Progress Notes (Signed)
Next infusion scheduled for Orencia on 12/27/20 and due for updated orders. Diagnosis:  Dose: '750mg'$  every 28 days (appropriate based on last recorded weight of 90.3kg)  Last Clinic Visit: 06/02/20 Next Clinic Visit: 04/05/21  Last infusion: 11/29/20 Labs: CBC and CMP on 11/29/20 - glucose elevated, CBC wnl TB Gold: negative on 07/12/20   Orders placed for Orencia '750mg'$  x 3 doses along with premedication of Tylenol and Benadryl to be administered 30 minutes before medication infusion.  Standing CBC with diff/platelet and CMP with GFR orders placed to be drawn every 2 months.  Next TB gold due February 2022  Knox Saliva, PharmD, MPH, BCPS Clinical Pharmacist (Rheumatology and Pulmonology)

## 2020-12-27 ENCOUNTER — Other Ambulatory Visit: Payer: Self-pay

## 2020-12-27 ENCOUNTER — Ambulatory Visit (HOSPITAL_COMMUNITY)
Admission: RE | Admit: 2020-12-27 | Discharge: 2020-12-27 | Disposition: A | Discharge status: Home / Self Care | Payer: Medicare Other | Source: Ambulatory Visit | Attending: Rheumatology | Admitting: Rheumatology

## 2020-12-27 DIAGNOSIS — M0579 Rheumatoid arthritis with rheumatoid factor of multiple sites without organ or systems involvement: Secondary | ICD-10-CM | POA: Insufficient documentation

## 2020-12-27 MED ORDER — SODIUM CHLORIDE 0.9 % IV SOLN
750.0000 mg | INTRAVENOUS | Status: DC
  Administered 2020-12-27: 750 mg via INTRAVENOUS
  Filled 2020-12-27: qty 30

## 2020-12-27 MED ORDER — ACETAMINOPHEN 325 MG PO TABS: 650.0000 mg | ORAL_TABLET | ORAL | Status: DC

## 2020-12-27 MED ORDER — DIPHENHYDRAMINE HCL 25 MG PO CAPS
25.0000 mg | ORAL_CAPSULE | ORAL | Status: DC
Start: 2020-12-27 — End: 2020-12-28

## 2021-01-24 ENCOUNTER — Other Ambulatory Visit: Payer: Self-pay

## 2021-01-24 ENCOUNTER — Ambulatory Visit (HOSPITAL_COMMUNITY)
Admission: RE | Admit: 2021-01-24 | Discharge: 2021-01-24 | Disposition: A | Discharge status: Home / Self Care | Payer: Medicare Other | Source: Ambulatory Visit | Attending: Rheumatology | Admitting: Rheumatology

## 2021-01-24 DIAGNOSIS — M0579 Rheumatoid arthritis with rheumatoid factor of multiple sites without organ or systems involvement: Secondary | ICD-10-CM | POA: Insufficient documentation

## 2021-01-24 DIAGNOSIS — Z79899 Other long term (current) drug therapy: Secondary | ICD-10-CM | POA: Insufficient documentation

## 2021-01-24 LAB — COMPREHENSIVE METABOLIC PANEL
ALT: 29 U/L (ref 0–44)
AST: 29 U/L (ref 15–41)
Albumin: 3.8 g/dL (ref 3.5–5.0)
Alkaline Phosphatase: 80 U/L (ref 38–126)
Anion gap: 8 (ref 5–15)
BUN: 20 mg/dL (ref 8–23)
CO2: 24 mmol/L (ref 22–32)
Calcium: 8.7 mg/dL — ABNORMAL LOW (ref 8.9–10.3)
Chloride: 105 mmol/L (ref 98–111)
Creatinine, Ser: 1.07 mg/dL (ref 0.61–1.24)
GFR, Estimated: >60 mL/min (ref >60)
Glucose, Bld: 115 mg/dL — ABNORMAL HIGH (ref 70–99)
Potassium: 3.7 mmol/L (ref 3.5–5.1)
Sodium: 137 mmol/L (ref 135–145)
Total Bilirubin: 0.7 mg/dL (ref 0.3–1.2)
Total Protein: 6.4 g/dL — ABNORMAL LOW (ref 6.5–8.1)

## 2021-01-24 LAB — CBC WITH DIFFERENTIAL/PLATELET
Abs Immature Granulocytes: 0.03 10*3/uL (ref 0.00–0.07)
Basophils Absolute: 0.1 10*3/uL (ref 0.0–0.1)
Basophils Relative: 2 %
Eosinophils Absolute: 0.2 10*3/uL (ref 0.0–0.5)
Eosinophils Relative: 4 %
HCT: 40.7 % (ref 39.0–52.0)
Hemoglobin: 14 g/dL (ref 13.0–17.0)
Immature Granulocytes: 1 %
Lymphocytes Relative: 34 %
Lymphs Abs: 1.8 10*3/uL (ref 0.7–4.0)
MCH: 31.3 pg (ref 26.0–34.0)
MCHC: 34.4 g/dL (ref 30.0–36.0)
MCV: 91.1 fL (ref 80.0–100.0)
Monocytes Absolute: 0.6 10*3/uL (ref 0.1–1.0)
Monocytes Relative: 10 %
Neutro Abs: 2.8 10*3/uL (ref 1.7–7.7)
Neutrophils Relative %: 49 %
Platelets: 301 10*3/uL (ref 150–400)
RBC: 4.47 MIL/uL (ref 4.22–5.81)
RDW: 12.5 % (ref 11.5–15.5)
WBC: 5.4 10*3/uL (ref 4.0–10.5)
nRBC: 0 % (ref 0.0–0.2)

## 2021-01-24 MED ORDER — SODIUM CHLORIDE 0.9 % IV SOLN
750.0000 mg | INTRAVENOUS | Status: DC
  Administered 2021-01-24: 750 mg via INTRAVENOUS
  Filled 2021-01-24: qty 30

## 2021-01-24 MED ORDER — DIPHENHYDRAMINE HCL 25 MG PO CAPS: 25.0000 mg | ORAL_CAPSULE | ORAL | Status: DC

## 2021-01-24 MED ORDER — ACETAMINOPHEN 325 MG PO TABS: 650.0000 mg | ORAL_TABLET | ORAL | Status: DC

## 2021-01-24 NOTE — Progress Notes (Signed)
Glucose is 115.  Calcium remains slightly low.  Please clarify if he has been taking a calcium and vitamin D supplement.  Total protein is borderline low but has improved.  We will continue to monitor.

## 2021-01-24 NOTE — Progress Notes (Signed)
CBC with diff WNL

## 2021-02-04 DIAGNOSIS — Z794 Long term (current) use of insulin: Secondary | ICD-10-CM | POA: Diagnosis not present

## 2021-02-04 DIAGNOSIS — I10 Essential (primary) hypertension: Secondary | ICD-10-CM | POA: Diagnosis not present

## 2021-02-04 DIAGNOSIS — E1165 Type 2 diabetes mellitus with hyperglycemia: Secondary | ICD-10-CM | POA: Diagnosis not present

## 2021-02-04 DIAGNOSIS — N182 Chronic kidney disease, stage 2 (mild): Secondary | ICD-10-CM | POA: Diagnosis not present

## 2021-02-04 DIAGNOSIS — E1122 Type 2 diabetes mellitus with diabetic chronic kidney disease: Secondary | ICD-10-CM | POA: Diagnosis not present

## 2021-02-04 LAB — HEMOGLOBIN A1C: Hemoglobin A1C: 6.8

## 2021-02-04 LAB — HM DIABETES EYE EXAM

## 2021-02-07 DIAGNOSIS — Z1159 Encounter for screening for other viral diseases: Secondary | ICD-10-CM | POA: Diagnosis not present

## 2021-02-07 DIAGNOSIS — E018 Other iodine-deficiency related thyroid disorders and allied conditions: Secondary | ICD-10-CM | POA: Diagnosis not present

## 2021-02-07 DIAGNOSIS — E1122 Type 2 diabetes mellitus with diabetic chronic kidney disease: Secondary | ICD-10-CM | POA: Diagnosis not present

## 2021-02-07 DIAGNOSIS — Z0000 Encounter for general adult medical examination without abnormal findings: Secondary | ICD-10-CM | POA: Diagnosis not present

## 2021-02-07 DIAGNOSIS — E669 Obesity, unspecified: Secondary | ICD-10-CM | POA: Diagnosis not present

## 2021-02-07 DIAGNOSIS — K219 Gastro-esophageal reflux disease without esophagitis: Secondary | ICD-10-CM | POA: Diagnosis not present

## 2021-02-07 DIAGNOSIS — N183 Chronic kidney disease, stage 3 unspecified: Secondary | ICD-10-CM | POA: Diagnosis not present

## 2021-02-07 DIAGNOSIS — Z125 Encounter for screening for malignant neoplasm of prostate: Secondary | ICD-10-CM | POA: Diagnosis not present

## 2021-02-07 DIAGNOSIS — E7800 Pure hypercholesterolemia, unspecified: Secondary | ICD-10-CM | POA: Diagnosis not present

## 2021-02-07 DIAGNOSIS — E1165 Type 2 diabetes mellitus with hyperglycemia: Secondary | ICD-10-CM | POA: Diagnosis not present

## 2021-02-07 DIAGNOSIS — Z1389 Encounter for screening for other disorder: Secondary | ICD-10-CM | POA: Diagnosis not present

## 2021-02-07 DIAGNOSIS — Z8701 Personal history of pneumonia (recurrent): Secondary | ICD-10-CM | POA: Diagnosis not present

## 2021-02-07 DIAGNOSIS — I10 Essential (primary) hypertension: Secondary | ICD-10-CM | POA: Diagnosis not present

## 2021-02-21 ENCOUNTER — Other Ambulatory Visit: Payer: Self-pay

## 2021-02-21 ENCOUNTER — Ambulatory Visit (HOSPITAL_COMMUNITY)
Admission: RE | Admit: 2021-02-21 | Discharge: 2021-02-21 | Disposition: A | Discharge status: Home / Self Care | Payer: Medicare Other | Source: Ambulatory Visit | Attending: Rheumatology | Admitting: Rheumatology

## 2021-02-21 DIAGNOSIS — M0579 Rheumatoid arthritis with rheumatoid factor of multiple sites without organ or systems involvement: Secondary | ICD-10-CM

## 2021-02-21 MED ORDER — SODIUM CHLORIDE 0.9 % IV SOLN
750.0000 mg | INTRAVENOUS | Status: DC
  Administered 2021-02-21: 750 mg via INTRAVENOUS
  Filled 2021-02-21: qty 30

## 2021-02-21 MED ORDER — DIPHENHYDRAMINE HCL 25 MG PO CAPS: 25.0000 mg | ORAL_CAPSULE | ORAL | Status: DC

## 2021-02-21 MED ORDER — ACETAMINOPHEN 325 MG PO TABS: 650.0000 mg | ORAL_TABLET | ORAL | Status: DC

## 2021-02-24 ENCOUNTER — Other Ambulatory Visit: Payer: Self-pay | Admitting: Pharmacist

## 2021-02-24 DIAGNOSIS — Z79899 Other long term (current) drug therapy: Secondary | ICD-10-CM

## 2021-02-24 DIAGNOSIS — M0579 Rheumatoid arthritis with rheumatoid factor of multiple sites without organ or systems involvement: Secondary | ICD-10-CM

## 2021-02-24 NOTE — Progress Notes (Signed)
Next infusion scheduled for Orencia IV on 03/21/21 and due for updated orders. Diagnosis:  Dose: 750mg  every 4 weeks (appropriate based on last recorded weight of 92.1kg)  Last Clinic Visit: 11/03/20 Next Clinic Visit: 03/16/21  Last infusion: 02/21/21  Labs: CBC and CMP on 01/24/21 - CBC wnl, CMP show slightly elevated glucose, slightly low calcium TB Gold: negative on 07/12/20   Orders placed for Orencia IV x 3 doses along with premedication of acetaminophen and diphenhydramine to be administered 30 minutes before medication infusion.  Standing CBC with diff/platelet and CMP with GFR orders placed to be drawn every 2 months.  Next TB gold due 07/12/21  Knox Saliva, PharmD, MPH, BCPS Clinical Pharmacist (Rheumatology and Pulmonology)

## 2021-03-07 DIAGNOSIS — R972 Elevated prostate specific antigen [PSA]: Secondary | ICD-10-CM | POA: Diagnosis not present

## 2021-03-21 ENCOUNTER — Other Ambulatory Visit: Payer: Self-pay

## 2021-03-21 ENCOUNTER — Ambulatory Visit (HOSPITAL_COMMUNITY)
Admission: RE | Admit: 2021-03-21 | Discharge: 2021-03-21 | Disposition: A | Discharge status: Home / Self Care | Payer: Medicare Other | Source: Ambulatory Visit | Attending: Rheumatology | Admitting: Rheumatology

## 2021-03-21 DIAGNOSIS — M0579 Rheumatoid arthritis with rheumatoid factor of multiple sites without organ or systems involvement: Secondary | ICD-10-CM | POA: Diagnosis not present

## 2021-03-21 DIAGNOSIS — Z79899 Other long term (current) drug therapy: Secondary | ICD-10-CM | POA: Diagnosis not present

## 2021-03-21 LAB — COMPREHENSIVE METABOLIC PANEL
ALT: 35 U/L (ref 0–44)
AST: 33 U/L (ref 15–41)
Albumin: 3.8 g/dL (ref 3.5–5.0)
Alkaline Phosphatase: 88 U/L (ref 38–126)
Anion gap: 8 (ref 5–15)
BUN: 16 mg/dL (ref 8–23)
CO2: 26 mmol/L (ref 22–32)
Calcium: 9 mg/dL (ref 8.9–10.3)
Chloride: 102 mmol/L (ref 98–111)
Creatinine, Ser: 1.11 mg/dL (ref 0.61–1.24)
GFR, Estimated: >60 mL/min (ref >60)
Glucose, Bld: 229 mg/dL — ABNORMAL HIGH (ref 70–99)
Potassium: 4 mmol/L (ref 3.5–5.1)
Sodium: 136 mmol/L (ref 135–145)
Total Bilirubin: 0.8 mg/dL (ref 0.3–1.2)
Total Protein: 6.5 g/dL (ref 6.5–8.1)

## 2021-03-21 LAB — CBC WITH DIFFERENTIAL/PLATELET
Abs Immature Granulocytes: 0.01 10*3/uL (ref 0.00–0.07)
Basophils Absolute: 0.1 10*3/uL (ref 0.0–0.1)
Basophils Relative: 1 %
Eosinophils Absolute: 0.2 10*3/uL (ref 0.0–0.5)
Eosinophils Relative: 3 %
HCT: 40.3 % (ref 39.0–52.0)
Hemoglobin: 14 g/dL (ref 13.0–17.0)
Immature Granulocytes: 0 %
Lymphocytes Relative: 40 %
Lymphs Abs: 2.4 10*3/uL (ref 0.7–4.0)
MCH: 30.8 pg (ref 26.0–34.0)
MCHC: 34.7 g/dL (ref 30.0–36.0)
MCV: 88.6 fL (ref 80.0–100.0)
Monocytes Absolute: 0.6 10*3/uL (ref 0.1–1.0)
Monocytes Relative: 10 %
Neutro Abs: 2.7 10*3/uL (ref 1.7–7.7)
Neutrophils Relative %: 46 %
Platelets: 238 10*3/uL (ref 150–400)
RBC: 4.55 MIL/uL (ref 4.22–5.81)
RDW: 12.6 % (ref 11.5–15.5)
WBC: 6 10*3/uL (ref 4.0–10.5)
nRBC: 0 % (ref 0.0–0.2)

## 2021-03-21 MED ORDER — SODIUM CHLORIDE 0.9 % IV SOLN
750.0000 mg | INTRAVENOUS | Status: DC
  Administered 2021-03-21: 750 mg via INTRAVENOUS
  Filled 2021-03-21: qty 30

## 2021-03-21 MED ORDER — DIPHENHYDRAMINE HCL 25 MG PO CAPS: 25.0000 mg | ORAL_CAPSULE | ORAL | Status: DC

## 2021-03-21 MED ORDER — ACETAMINOPHEN 325 MG PO TABS: 650.0000 mg | ORAL_TABLET | ORAL | Status: DC

## 2021-03-21 NOTE — Progress Notes (Signed)
CBC WNL

## 2021-03-21 NOTE — Progress Notes (Signed)
Glucose is elevated-229.  Rest of CMP WNL

## 2021-03-22 NOTE — Progress Notes (Deleted)
Office Visit Note  Patient: Joel Langlinais Uballe Sr.             Date of Birth: March 04, 1946           MRN: [id number]             PCP: Scheryl Marten, PA Referring: Scheryl Marten, [place] Visit Date: 04/05/2021 Occupation: @GUAROCC @  Subjective:    History of Present Illness: Joel Weinberg Coleson Sr. is a 75 y.o. male with history of seropositive rheumatoid arthritis, osteoarthritis, and DDD.  He is receiving IV Orencia infusions 750 mg every 28 days.   CBC and CMP updated on 03/21/21.  TB gold negative on 07/12/20.  Future order for TB gold will be placed today.   Activities of Daily Living:  Patient reports morning stiffness for *** {minute/hour:19697}.   Patient {ACTIONS;DENIES/REPORTS:21021675::"Denies"} nocturnal pain.  Difficulty dressing/grooming: {ACTIONS;DENIES/REPORTS:21021675::"Denies"} Difficulty climbing stairs: {ACTIONS;DENIES/REPORTS:21021675::"Denies"} Difficulty getting out of chair: {ACTIONS;DENIES/REPORTS:21021675::"Denies"} Difficulty using hands for taps, buttons, cutlery, and/or writing: {ACTIONS;DENIES/REPORTS:21021675::"Denies"}  No Rheumatology ROS completed.   PMFS History:  Patient Active Problem List   Diagnosis Date Noted   Tear of left rotator cuff 09/24/2017   Acute pain of left shoulder 03/12/2017   Rib pain 03/12/2017   DDD (degenerative disc disease), cervical 10/31/2016   Primary osteoarthritis of both hands 10/31/2016   Primary osteoarthritis of both feet 10/31/2016   High risk medication use 07/06/2016   History of diabetes mellitus 07/06/2016   History of coronary artery disease 07/06/2016   Rheumatoid arthritis involving multiple sites with positive rheumatoid factor ([place]) 06/02/2016   Other fatigue 06/02/2016   Contracture, elbow, left 06/02/2016   Swelling of left hand 05/01/2016   Osteoarthritis of right knee 08/27/2015   S/P TKR (total knee replacement), left 08/27/2015   Essential hypertension 05/11/2015   Pain in the chest    Right groin  pain 08/15/2012   Chest pain 04/02/2012   Cerebrovascular disease 04/02/2012   CAD (coronary artery disease) 04/02/2012   Diabetes mellitus without complication ([place])    Dizziness    Sinus bradycardia    Hyperlipidemia    Edema     Past Medical History:  Diagnosis Date   Anemia    Arthritis    "all over"   Bradycardia    Chronic bronchitis ([place])    "get it just about q yr" (03/17/2014)   Daily headache    "here lately" (03/17/2014); relates to sinuses   GERD (gastroesophageal reflux disease)    Hard of hearing    hearing aides bilat   Hiatal hernia    History of blood transfusion 2008   "related to OR"   History of kidney stones    Hyperlipidemia    Hypertension    Pneumonia 1972 X 1   Rheumatoid arthritis ([place])    Type II diabetes mellitus ([place])    Wears glasses     Family History  Problem Relation Age of Onset   Cancer Father        prostate   Past Surgical History:  Procedure Laterality Date   broken finger      surgical repaired left hand 2nd finger    cyst removed      per left knee/posteriorly   CYSTECTOMY Left 2009   "behind knee"   INGUINAL HERNIA REPAIR Right ?2010   JOINT REPLACEMENT     KNEE ARTHROSCOPY Bilateral 1980's   PARTIAL KNEE ARTHROPLASTY Right 08/27/2015   Procedure: RIGHT UNICOMPARTMENTAL KNEE ARTHROPLASTY;  Surgeon: Lind Guest  Ninfa Linden, MD;  Location: [facility];  Service: Orthopedics;  Laterality: Right;   REVISION TOTAL KNEE ARTHROPLASTY Left 2008   SHOULDER SURGERY Left 2019   TOTAL KNEE ARTHROPLASTY Left 2003   Social History   Social History Narrative   Patient lives at home with family.   Caffeine Use: occasionally   Immunization History  Administered Date(s) Administered   Pneumococcal Conjugate-13 01/13/2014     Objective: Vital Signs: There were no vitals taken for this visit.   Physical Exam Vitals and nursing note reviewed.  Constitutional:      Appearance: He is well-developed.  HENT:     Head: Normocephalic and  atraumatic.  Eyes:     Conjunctiva/sclera: Conjunctivae normal.     Pupils: Pupils are equal, round, and reactive to light.  Pulmonary:     Effort: Pulmonary effort is normal.  Abdominal:     Palpations: Abdomen is soft.  Musculoskeletal:     Cervical back: Normal range of motion and neck supple.  Skin:    General: Skin is warm and dry.     Capillary Refill: Capillary refill takes less than 2 seconds.  Neurological:     Mental Status: He is alert and oriented to person, place, and time.  Psychiatric:        Behavior: Behavior normal.     Musculoskeletal Exam: ***  CDAI Exam: CDAI Score: -- Patient Global: --; Provider Global: -- Swollen: --; Tender: -- Joint Exam 04/05/2021   No joint exam has been documented for this visit   There is currently no information documented on the homunculus. Go to the Rheumatology activity and complete the homunculus joint exam.  Investigation: No additional findings.  Imaging: No results found.  Recent Labs: Lab Results  Component Value Date   WBC 6.0 03/21/2021   HGB 14.0 03/21/2021   PLT 238 03/21/2021   NA 136 03/21/2021   K 4.0 03/21/2021   CL 102 03/21/2021   CO2 26 03/21/2021   GLUCOSE 229 (H) 03/21/2021   BUN 16 03/21/2021   CREATININE 1.11 03/21/2021   BILITOT 0.8 03/21/2021   ALKPHOS 88 03/21/2021   AST 33 03/21/2021   ALT 35 03/21/2021   PROT 6.5 03/21/2021   ALBUMIN 3.8 03/21/2021   CALCIUM 9.0 03/21/2021   GFRAA >60 01/26/2020   QFTBGOLDPLUS Negative 07/12/2020    Speciality Comments: Orencia 750mg  every 4 weeks TB gold negative on Jan 2019  Procedures:  No procedures performed Allergies: Codeine, Lipitor [atorvastatin], Invokana [canagliflozin], Losartan, Roxicodone [oxycodone], and Vicodin [hydrocodone-acetaminophen]   Assessment / Plan:     Visit Diagnoses: No diagnosis found.  Orders: No orders of the defined types were placed in this encounter.  No orders of the defined types were placed in this  encounter.   Face-to-face time spent with patient was *** minutes. Greater than 50% of time was spent in counseling and coordination of care.  Follow-Up Instructions: No follow-ups on file.   Earnestine Mealing, CMA  Note - This record has been created using Editor, commissioning.  Chart creation errors have been sought, but [place] not always  have been located. Such creation errors do not reflect on  the standard of medical care.

## 2021-04-05 ENCOUNTER — Ambulatory Visit: Payer: Medicare Other | Admitting: Physician Assistant

## 2021-04-05 DIAGNOSIS — Z8679 Personal history of other diseases of the circulatory system: Secondary | ICD-10-CM

## 2021-04-05 DIAGNOSIS — Z79899 Other long term (current) drug therapy: Secondary | ICD-10-CM

## 2021-04-05 DIAGNOSIS — M19071 Primary osteoarthritis, right ankle and foot: Secondary | ICD-10-CM

## 2021-04-05 DIAGNOSIS — M0579 Rheumatoid arthritis with rheumatoid factor of multiple sites without organ or systems involvement: Secondary | ICD-10-CM

## 2021-04-05 DIAGNOSIS — M19041 Primary osteoarthritis, right hand: Secondary | ICD-10-CM

## 2021-04-05 DIAGNOSIS — R5383 Other fatigue: Secondary | ICD-10-CM

## 2021-04-05 DIAGNOSIS — M5030 Other cervical disc degeneration, unspecified cervical region: Secondary | ICD-10-CM

## 2021-04-05 DIAGNOSIS — Z96652 Presence of left artificial knee joint: Secondary | ICD-10-CM

## 2021-04-05 DIAGNOSIS — Z96651 Presence of right artificial knee joint: Secondary | ICD-10-CM

## 2021-04-05 DIAGNOSIS — F5104 Psychophysiologic insomnia: Secondary | ICD-10-CM

## 2021-04-05 DIAGNOSIS — Z8639 Personal history of other endocrine, nutritional and metabolic disease: Secondary | ICD-10-CM

## 2021-04-15 ENCOUNTER — Other Ambulatory Visit (HOSPITAL_COMMUNITY): Payer: Self-pay | Admitting: *Deleted

## 2021-04-18 ENCOUNTER — Other Ambulatory Visit: Payer: Self-pay

## 2021-04-18 ENCOUNTER — Ambulatory Visit (HOSPITAL_COMMUNITY)
Admission: RE | Admit: 2021-04-18 | Discharge: 2021-04-18 | Disposition: A | Discharge status: Home / Self Care | Payer: Medicare Other | Source: Ambulatory Visit | Attending: Rheumatology | Admitting: Rheumatology

## 2021-04-18 DIAGNOSIS — M0579 Rheumatoid arthritis with rheumatoid factor of multiple sites without organ or systems involvement: Secondary | ICD-10-CM | POA: Diagnosis not present

## 2021-04-18 MED ORDER — SODIUM CHLORIDE 0.9 % IV SOLN
750.0000 mg | INTRAVENOUS | Status: DC
  Administered 2021-04-18: 750 mg via INTRAVENOUS
  Filled 2021-04-18: qty 30

## 2021-04-18 MED ORDER — DIPHENHYDRAMINE HCL 25 MG PO CAPS: 25.0000 mg | ORAL_CAPSULE | ORAL | Status: DC

## 2021-04-18 MED ORDER — ACETAMINOPHEN 325 MG PO TABS: 650.0000 mg | ORAL_TABLET | ORAL | Status: DC

## 2021-05-04 DIAGNOSIS — E11649 Type 2 diabetes mellitus with hypoglycemia without coma: Secondary | ICD-10-CM | POA: Diagnosis not present

## 2021-05-04 DIAGNOSIS — E1122 Type 2 diabetes mellitus with diabetic chronic kidney disease: Secondary | ICD-10-CM | POA: Diagnosis not present

## 2021-05-04 DIAGNOSIS — E1165 Type 2 diabetes mellitus with hyperglycemia: Secondary | ICD-10-CM | POA: Diagnosis not present

## 2021-05-04 DIAGNOSIS — I10 Essential (primary) hypertension: Secondary | ICD-10-CM | POA: Diagnosis not present

## 2021-05-04 DIAGNOSIS — Z794 Long term (current) use of insulin: Secondary | ICD-10-CM | POA: Diagnosis not present

## 2021-05-04 DIAGNOSIS — N182 Chronic kidney disease, stage 2 (mild): Secondary | ICD-10-CM | POA: Diagnosis not present

## 2021-05-17 ENCOUNTER — Ambulatory Visit (HOSPITAL_COMMUNITY)
Admission: RE | Admit: 2021-05-17 | Discharge: 2021-05-17 | Disposition: A | Discharge status: Home / Self Care | Payer: Medicare Other | Source: Ambulatory Visit | Attending: Rheumatology | Admitting: Rheumatology

## 2021-05-17 ENCOUNTER — Other Ambulatory Visit: Payer: Self-pay

## 2021-05-17 VITALS — BP 127/77 | HR 86 | Temp 97.9°F | Resp 20 | Wt 210.0 lb

## 2021-05-17 DIAGNOSIS — M75112 Incomplete rotator cuff tear or rupture of left shoulder, not specified as traumatic: Secondary | ICD-10-CM | POA: Diagnosis not present

## 2021-05-17 DIAGNOSIS — M75102 Unspecified rotator cuff tear or rupture of left shoulder, not specified as traumatic: Secondary | ICD-10-CM | POA: Insufficient documentation

## 2021-05-17 DIAGNOSIS — M0579 Rheumatoid arthritis with rheumatoid factor of multiple sites without organ or systems involvement: Secondary | ICD-10-CM | POA: Insufficient documentation

## 2021-05-17 DIAGNOSIS — Z79899 Other long term (current) drug therapy: Secondary | ICD-10-CM | POA: Insufficient documentation

## 2021-05-17 DIAGNOSIS — Z8639 Personal history of other endocrine, nutritional and metabolic disease: Secondary | ICD-10-CM | POA: Insufficient documentation

## 2021-05-17 LAB — CBC WITH DIFFERENTIAL/PLATELET
Abs Immature Granulocytes: 0.02 10*3/uL (ref 0.00–0.07)
Basophils Absolute: 0 10*3/uL (ref 0.0–0.1)
Basophils Relative: 0 %
Eosinophils Absolute: 0 10*3/uL (ref 0.0–0.5)
Eosinophils Relative: 0 %
HCT: 44 % (ref 39.0–52.0)
Hemoglobin: 14.8 g/dL (ref 13.0–17.0)
Immature Granulocytes: 0 %
Lymphocytes Relative: 28 %
Lymphs Abs: 1.9 10*3/uL (ref 0.7–4.0)
MCH: 29.8 pg (ref 26.0–34.0)
MCHC: 33.6 g/dL (ref 30.0–36.0)
MCV: 88.7 fL (ref 80.0–100.0)
Monocytes Absolute: 0.4 10*3/uL (ref 0.1–1.0)
Monocytes Relative: 6 %
Neutro Abs: 4.5 10*3/uL (ref 1.7–7.7)
Neutrophils Relative %: 66 %
Platelets: 147 10*3/uL — ABNORMAL LOW (ref 150–400)
RBC: 4.96 MIL/uL (ref 4.22–5.81)
RDW: 13.3 % (ref 11.5–15.5)
WBC: 6.9 10*3/uL (ref 4.0–10.5)
nRBC: 0 % (ref 0.0–0.2)

## 2021-05-17 LAB — COMPREHENSIVE METABOLIC PANEL
ALT: 467 U/L — ABNORMAL HIGH (ref 0–44)
AST: 251 U/L — ABNORMAL HIGH (ref 15–41)
Albumin: 3.4 g/dL — ABNORMAL LOW (ref 3.5–5.0)
Alkaline Phosphatase: 357 U/L — ABNORMAL HIGH (ref 38–126)
Anion gap: 10 (ref 5–15)
BUN: 14 mg/dL (ref 8–23)
CO2: 28 mmol/L (ref 22–32)
Calcium: 8.5 mg/dL — ABNORMAL LOW (ref 8.9–10.3)
Chloride: 98 mmol/L (ref 98–111)
Creatinine, Ser: 1.4 mg/dL — ABNORMAL HIGH (ref 0.61–1.24)
GFR, Estimated: 53 mL/min — ABNORMAL LOW (ref >60)
Glucose, Bld: 136 mg/dL — ABNORMAL HIGH (ref 70–99)
Potassium: 2.8 mmol/L — ABNORMAL LOW (ref 3.5–5.1)
Sodium: 136 mmol/L (ref 135–145)
Total Bilirubin: 1.6 mg/dL — ABNORMAL HIGH (ref 0.3–1.2)
Total Protein: 6.4 g/dL — ABNORMAL LOW (ref 6.5–8.1)

## 2021-05-17 MED ORDER — ACETAMINOPHEN 325 MG PO TABS: 650.0000 mg | ORAL_TABLET | ORAL | Status: DC

## 2021-05-17 MED ORDER — SODIUM CHLORIDE 0.9 % IV SOLN
750.0000 mg | INTRAVENOUS | Status: DC
  Administered 2021-05-17: 750 mg via INTRAVENOUS
  Filled 2021-05-17: qty 30

## 2021-05-17 MED ORDER — DIPHENHYDRAMINE HCL 25 MG PO CAPS: 25.0000 mg | ORAL_CAPSULE | ORAL | Status: DC

## 2021-05-17 NOTE — Progress Notes (Signed)
Platelets are low.  Rest of the CBC is normal.  We will continue to monitor labs.

## 2021-05-17 NOTE — Progress Notes (Signed)
Potassium is extremely low.  Please notify patient and contact his PCPs office.  Creatinine is elevated.  Calcium is low due to low albumin.  Liver functions are extremely high.  He needs evaluation by his PCP.

## 2021-05-17 NOTE — Progress Notes (Signed)
I called back and spoke with the patient.  I advised him to go to the urgent care or emergency room this evening but he declined.  He stated that he will contact his PCPs office tomorrow morning.

## 2021-05-18 ENCOUNTER — Encounter: Payer: Self-pay | Admitting: *Deleted

## 2021-05-18 ENCOUNTER — Other Ambulatory Visit: Payer: Self-pay

## 2021-05-18 ENCOUNTER — Encounter: Payer: Self-pay | Admitting: Internal Medicine

## 2021-05-18 ENCOUNTER — Ambulatory Visit
Admission: EM | Admit: 2021-05-18 | Discharge: 2021-05-18 | Discharge status: Against Medical Advice | Payer: Medicare Other

## 2021-05-18 ENCOUNTER — Ambulatory Visit (INDEPENDENT_AMBULATORY_CARE_PROVIDER_SITE_OTHER): Payer: Medicare Other | Admitting: Internal Medicine

## 2021-05-18 VITALS — BP 136/84 | HR 59 | Resp 18 | Ht 66.0 in | Wt 206.0 lb

## 2021-05-18 DIAGNOSIS — N179 Acute kidney failure, unspecified: Secondary | ICD-10-CM | POA: Diagnosis not present

## 2021-05-18 DIAGNOSIS — K219 Gastro-esophageal reflux disease without esophagitis: Secondary | ICD-10-CM | POA: Insufficient documentation

## 2021-05-18 DIAGNOSIS — Z887 Allergy status to serum and vaccine status: Secondary | ICD-10-CM | POA: Diagnosis not present

## 2021-05-18 DIAGNOSIS — E1169 Type 2 diabetes mellitus with other specified complication: Secondary | ICD-10-CM | POA: Insufficient documentation

## 2021-05-18 DIAGNOSIS — D696 Thrombocytopenia, unspecified: Secondary | ICD-10-CM

## 2021-05-18 DIAGNOSIS — Z794 Long term (current) use of insulin: Secondary | ICD-10-CM | POA: Insufficient documentation

## 2021-05-18 DIAGNOSIS — M0579 Rheumatoid arthritis with rheumatoid factor of multiple sites without organ or systems involvement: Secondary | ICD-10-CM | POA: Diagnosis not present

## 2021-05-18 DIAGNOSIS — E785 Hyperlipidemia, unspecified: Secondary | ICD-10-CM | POA: Diagnosis not present

## 2021-05-18 DIAGNOSIS — E876 Hypokalemia: Secondary | ICD-10-CM | POA: Diagnosis not present

## 2021-05-18 DIAGNOSIS — I10 Essential (primary) hypertension: Secondary | ICD-10-CM | POA: Diagnosis not present

## 2021-05-18 DIAGNOSIS — R7401 Elevation of levels of liver transaminase levels: Secondary | ICD-10-CM | POA: Insufficient documentation

## 2021-05-18 DIAGNOSIS — E669 Obesity, unspecified: Secondary | ICD-10-CM | POA: Insufficient documentation

## 2021-05-18 DIAGNOSIS — R001 Bradycardia, unspecified: Secondary | ICD-10-CM

## 2021-05-18 HISTORY — DX: Acute kidney failure, unspecified: N17.9

## 2021-05-18 NOTE — Assessment & Plan Note (Signed)
Asymptomatic Followed by cardiology

## 2021-05-18 NOTE — Assessment & Plan Note (Signed)
BP Readings from Last 1 Encounters:  05/18/21 136/84   Well-controlled with amlodipine Counseled for compliance with the medications Advised DASH diet and moderate exercise/walking as tolerated

## 2021-05-18 NOTE — Assessment & Plan Note (Addendum)
Lab Results  Component Value Date   HGBA1C 6.8 02/04/2021    Has insulin pump, followed by Dr. Buddy Duty with [facility] Advised to follow diabetic diet On statin and ACEi F/u CMP Diabetic eye exam: Advised to follow up with Ophthalmology for diabetic eye exam

## 2021-05-18 NOTE — Assessment & Plan Note (Addendum)
Last CMP reviewed from yesterday Could be from dehydration from recent infection Advised to improve hydration for now Check CMP in the next week

## 2021-05-18 NOTE — Assessment & Plan Note (Signed)
Followed by Rheumatology Gets Orencia infusions, last had it yesterday

## 2021-05-18 NOTE — Assessment & Plan Note (Signed)
On pravastatin.   

## 2021-05-18 NOTE — Assessment & Plan Note (Signed)
CMP reviewed from yesterday -he is currently asymptomatic He does report URTI in the last week, and was taking Alka-Seltzer and Tylenol concurrently, which could lead to acute rise in transaminase Could be elevated as acute phase reactant from recent infection as well Will check CMP in the next week Strictly advised to go to ER if he has any new symptoms such as nausea, vomiting, icterus or diarrhea

## 2021-05-18 NOTE — ED Triage Notes (Signed)
Called Patient in the lobby and in car and no answer. We also went outside to see if we could see him in his car but we couldn't find him. Put his name back in waiting.

## 2021-05-18 NOTE — Progress Notes (Signed)
New Patient Office Visit  Subjective:  Patient ID: Joel Derousse Fielder Sr., male    DOB: June 07, 1945  Age: 76 y.o. MRN: [id number]  CC:  Chief Complaint  Patient presents with   New Patient (Initial Visit)    New patient was told to go to ER he had infusion for RA yesterday and they called back said potassium was high and liver enzymes was really high pt was sick a few weeks back and was taking tylenol didn't know if this caused it     HPI Joel Frieson Orengo Sr. is a 76 y.o. male with past medical history of HTN, type II DM with HLD, RA, OA and obesity who presents for establishing care.  He gets Orencia infusions for RA.  He went for infusion yesterday, where he had CBC and CMP done.  He was later told to go to ER as his transaminase level was noted to be elevated along with hypokalemia.  He states that he recently had URTI, for which she was taking Alka-Seltzer with Tylenol multiple times in a day.  He currently denies any nausea, vomiting, yellowish discoloration of the skin, diarrhea, fever or chills.  He denies any recent alcohol intake.  HTN: BP is well-controlled. Takes medications regularly. Patient denies headache, dizziness, chest pain, dyspnea or palpitations.  Type II DM with HLD: Followed by Dr. Buddy Duty at [facility].  He has an insulin pump. His last HbA1C was 6.8 in 01/2021.  He denies any fatigue, polyuria or polydipsia currently.  He is on statin for HLD.  He also has a history of OA of multiple joints.  He has had left TKR as well.  He has chronic bilateral knee and hand pain, for which he used to take Tylenol, but has not taken it since yesterday.  He has not had COVID-vaccine.  He is allergic to flu vaccine.  Past Medical History:  Diagnosis Date   Anemia    Arthritis    "all over"   Bradycardia    Chronic bronchitis ([place])    "get it just about q yr" (03/17/2014)   Daily headache    "here lately" (03/17/2014); relates to sinuses   GERD (gastroesophageal reflux disease)    Hard  of hearing    hearing aides bilat   Hiatal hernia    History of blood transfusion 2008   "related to OR"   History of kidney stones    Hyperlipidemia    Hypertension    Pneumonia 1972 X 1   Rheumatoid arthritis ([place])    Type II diabetes mellitus ([place])    Wears glasses     Past Surgical History:  Procedure Laterality Date   broken finger      surgical repaired left hand 2nd finger    cyst removed      per left knee/posteriorly   CYSTECTOMY Left 2009   "behind knee"   INGUINAL HERNIA REPAIR Right ?2010   JOINT REPLACEMENT     KNEE ARTHROSCOPY Bilateral 1980's   PARTIAL KNEE ARTHROPLASTY Right 08/27/2015   Procedure: RIGHT UNICOMPARTMENTAL KNEE ARTHROPLASTY;  Surgeon: Mcarthur Rossetti, MD;  Location: [facility];  Service: Orthopedics;  Laterality: Right;   REVISION TOTAL KNEE ARTHROPLASTY Left 2008   SHOULDER SURGERY Left 2019   TOTAL KNEE ARTHROPLASTY Left 2003    Family History  Problem Relation Age of Onset   Cancer Father        prostate    Social History   Socioeconomic History  Marital status: Married    Spouse name: Abe People   Number of children: 3   Years of education: College   Highest education level: Not on file  Occupational History    Employer: UNEMPLOYED  Tobacco Use   Smoking status: Former    Packs/day: 1.00    Years: 5.00    Pack years: 5.00    Types: Cigarettes, Cigars    Quit date: 05/15/1968    Years since quitting: 53.0   Smokeless tobacco: Never  Vaping Use   Vaping Use: Never used  Substance and Sexual Activity   Alcohol use: No    Alcohol/week: 0.0 standard drinks   Drug use: Never   Sexual activity: Yes  Other Topics Concern   Not on file  Social History Narrative   Patient lives at home with family.   Caffeine Use: occasionally   Social Determinants of Health   Financial Resource Strain: Not on file  Food Insecurity: Not on file  Transportation Needs: Not on file  Physical Activity: Not on file  Stress: Not on file   Social Connections: Not on file  Intimate Partner Violence: Not on file    ROS Review of Systems  Constitutional:  Negative for chills and fever.  HENT:  Negative for congestion and sore throat.   Eyes:  Negative for pain and discharge.  Respiratory:  Negative for cough and shortness of breath.   Cardiovascular:  Negative for chest pain and palpitations.  Gastrointestinal:  Negative for constipation, diarrhea, nausea and vomiting.  Endocrine: Negative for polydipsia and polyuria.  Genitourinary:  Negative for dysuria and hematuria.  Musculoskeletal:  Positive for arthralgias and back pain. Negative for neck pain and neck stiffness.  Skin:  Negative for rash.  Neurological:  Negative for dizziness, weakness, numbness and headaches.  Psychiatric/Behavioral:  Negative for agitation and behavioral problems.    Objective:   Today's Vitals: BP 136/84 (BP Location: Left Arm, Patient Position: Sitting, Cuff Size: Normal)    Pulse (!) 59    Resp 18    Ht _0  (1.676 m)    Wt 206 lb 0.6 oz (93.5 kg)    SpO2 96%    BMI 33.26 kg/m   Physical Exam Vitals reviewed.  Constitutional:      General: He is not in acute distress.    Appearance: He is obese. He is not diaphoretic.  HENT:     Head: Normocephalic and atraumatic.     Nose: Nose normal.     Mouth/Throat:     Mouth: Mucous membranes are moist.  Eyes:     General: No scleral icterus.    Extraocular Movements: Extraocular movements intact.  Cardiovascular:     Rate and Rhythm: Normal rate and regular rhythm.     Pulses: Normal pulses.     Heart sounds: Normal heart sounds. No murmur heard. Pulmonary:     Breath sounds: Normal breath sounds. No wheezing or rales.  Abdominal:     Palpations: Abdomen is soft.     Tenderness: There is no abdominal tenderness. There is no guarding or rebound.  Musculoskeletal:     Cervical back: Neck supple. No tenderness.     Right lower leg: No edema.     Left lower leg: No edema.  Skin:     General: Skin is warm.     Findings: No rash.  Neurological:     General: No focal deficit present.     Mental Status: He is alert and oriented to  person, place, and time.     Sensory: No sensory deficit.     Motor: No weakness.  Psychiatric:        Mood and Affect: Mood normal.        Behavior: Behavior normal.    Assessment & Plan:   Problem List Items Addressed This Visit       Cardiovascular and Mediastinum   Sinus bradycardia    Asymptomatic Followed by cardiology      Essential hypertension    BP Readings from Last 1 Encounters:  05/18/21 136/84  Well-controlled with amlodipine Counseled for compliance with the medications Advised DASH diet and moderate exercise/walking as tolerated        Endocrine   Type 2 diabetes mellitus with other specified complication ([place])    Lab Results  Component Value Date   HGBA1C 6.8 02/04/2021   Has insulin pump, followed by Dr. Buddy Duty with [facility] Advised to follow diabetic diet On statin and ACEi F/u CMP Diabetic eye exam: Advised to follow up with Ophthalmology for diabetic eye exam        Musculoskeletal and Integument   Rheumatoid arthritis involving multiple sites with positive rheumatoid factor ([place])    Followed by Rheumatology Gets Orencia infusions, last had it yesterday        Genitourinary   AKI (acute kidney injury) ([place])    Last CMP reviewed from yesterday Could be from dehydration from recent infection Advised to improve hydration for now Check CMP in the next week      Relevant Orders   CMP14+EGFR     Other   Hyperlipidemia    On pravastatin      Elevated transaminase level - Primary    CMP reviewed from yesterday -he is currently asymptomatic He does report URTI in the last week, and was taking Alka-Seltzer and Tylenol concurrently, which could lead to acute rise in transaminase Could be elevated as acute phase reactant from recent infection as well Will check CMP in the next  week Strictly advised to go to ER if he has any new symptoms such as nausea, vomiting, icterus or diarrhea      Relevant Orders   CMP14+EGFR   Other Visit Diagnoses     Hypokalemia     Could be related to dehydration from recent infection Check CMP   Relevant Orders   CMP14+EGFR   Thrombocytopenia ([place])     Noted in the last CBC Will repeat CBC in the next week   Relevant Orders   CBC   Allergy to influenza vaccine           Outpatient Encounter Medications as of 05/18/2021  Medication Sig   amLODipine (NORVASC) 5 MG tablet Take 1 tablet (5 mg total) by mouth daily.   Ascorbic Acid (VITAMIN C PO) Take by mouth daily.   aspirin EC 81 MG tablet Take 81 mg by mouth daily.   Calcium Carbonate-Vit D-Min (CALCIUM 1200 PO) Take by mouth daily.   Cholecalciferol (VITAMIN D3 PO) Take by mouth daily.   HUMALOG 100 UNIT/ML injection INJ UP TO 65 UNI [place] VIA INSULIN PUMP QD   insulin aspart (NOVOLOG) 100 UNIT/ML FlexPen Inject 1 Units into the skin 3 (three) times daily with meals. 1 unit per hour   KRILL OIL 1000 MG CAPS Take 1,000 mg by mouth daily.   Lancets (ONETOUCH ULTRASOFT) lancets FOR USE WHEN CHECKING BLOOD SUGAR QID.   loratadine (CLARITIN) 10 MG tablet Take 10 mg by mouth daily.  Misc Natural Products (TART CHERRY ADVANCED PO) Take by mouth daily.   Multiple Vitamin (MULTIVITAMIN WITH MINERALS) TABS Take 1 tablet by mouth daily.   Multiple Vitamins-Minerals (ZINC PO) Take by mouth daily.   ONE TOUCH ULTRA TEST test strip USE WHEN CHECKING BLOOD SUGAR QID.   pantoprazole (PROTONIX) 40 MG tablet TAKE 1 TABLET DAILY. NEED OFFICE VISIT AND LABS BEFORE FURTHER REFILLS   POTASSIUM PO Take by mouth daily.   pravastatin (PRAVACHOL) 40 MG tablet TAKE 1 TABLET EVERY EVENING   Pyridoxine HCl (VITAMIN B-6 PO) Take by mouth daily.   ramipril (ALTACE) 5 MG capsule TAKE 1 CAPSULE EVERY DAY   vitamin E 400 UNIT capsule Take 400 Units by mouth daily.   [DISCONTINUED] Abatacept (ORENCIA IV)  Inject into the vein every 28 (twenty-eight) days. (Patient not taking: Reported on 05/18/2021)   [DISCONTINUED] vitamin B-12 (CYANOCOBALAMIN) 1000 MCG tablet Take 1,000 mcg by mouth daily. (Patient not taking: Reported on 05/18/2021)   No facility-administered encounter medications on file as of 05/18/2021.    Follow-up: Return in about 1 week (around 05/25/2021) for Elevated transaminase level.   Lindell Spar, MD

## 2021-05-18 NOTE — Patient Instructions (Signed)
Please get fasting blood tests done on Monday.  If you start having nausea/vomiting, diarrhea or notice yellowish discoloration of your skin, you need to go to ER.  Please avoid taking Tylenol for now.  Please continue to follow low carb diet.

## 2021-05-19 ENCOUNTER — Telehealth: Payer: Self-pay | Admitting: *Deleted

## 2021-05-19 ENCOUNTER — Ambulatory Visit: Payer: Medicare Other | Admitting: Internal Medicine

## 2021-05-19 NOTE — Telephone Encounter (Signed)
Patient contacted the office and states he tried being seen at urgent care. Patient states he waited for 3 hours without being seen. Patient states he was able to get an appointment with PCP, Dr. Posey Pronto. He states that Dr. Posey Pronto is going to recheck lab work in 1 week. Patient he advised Dr. Posey Pronto he was taking Alka-Seltzer with Tylenol multiple times in a day. Patient has scheduled a follow up visit in our office for 05/23/2021 as he has not been seen since June 2022.

## 2021-05-23 ENCOUNTER — Ambulatory Visit (INDEPENDENT_AMBULATORY_CARE_PROVIDER_SITE_OTHER): Payer: Medicare Other | Admitting: Physician Assistant

## 2021-05-23 ENCOUNTER — Encounter: Payer: Self-pay | Admitting: Physician Assistant

## 2021-05-23 ENCOUNTER — Other Ambulatory Visit: Payer: Self-pay

## 2021-05-23 VITALS — BP 143/73 | HR 46 | Ht 66.0 in | Wt 205.2 lb

## 2021-05-23 DIAGNOSIS — M19072 Primary osteoarthritis, left ankle and foot: Secondary | ICD-10-CM

## 2021-05-23 DIAGNOSIS — E876 Hypokalemia: Secondary | ICD-10-CM

## 2021-05-23 DIAGNOSIS — Z96651 Presence of right artificial knee joint: Secondary | ICD-10-CM

## 2021-05-23 DIAGNOSIS — R7401 Elevation of levels of liver transaminase levels: Secondary | ICD-10-CM | POA: Diagnosis not present

## 2021-05-23 DIAGNOSIS — Z96652 Presence of left artificial knee joint: Secondary | ICD-10-CM

## 2021-05-23 DIAGNOSIS — Z8679 Personal history of other diseases of the circulatory system: Secondary | ICD-10-CM

## 2021-05-23 DIAGNOSIS — Z8639 Personal history of other endocrine, nutritional and metabolic disease: Secondary | ICD-10-CM

## 2021-05-23 DIAGNOSIS — M5030 Other cervical disc degeneration, unspecified cervical region: Secondary | ICD-10-CM

## 2021-05-23 DIAGNOSIS — Z79899 Other long term (current) drug therapy: Secondary | ICD-10-CM | POA: Diagnosis not present

## 2021-05-23 DIAGNOSIS — F5104 Psychophysiologic insomnia: Secondary | ICD-10-CM

## 2021-05-23 DIAGNOSIS — N179 Acute kidney failure, unspecified: Secondary | ICD-10-CM | POA: Diagnosis not present

## 2021-05-23 DIAGNOSIS — M0579 Rheumatoid arthritis with rheumatoid factor of multiple sites without organ or systems involvement: Secondary | ICD-10-CM

## 2021-05-23 DIAGNOSIS — R5383 Other fatigue: Secondary | ICD-10-CM | POA: Diagnosis not present

## 2021-05-23 DIAGNOSIS — M19071 Primary osteoarthritis, right ankle and foot: Secondary | ICD-10-CM

## 2021-05-23 DIAGNOSIS — R7989 Other specified abnormal findings of blood chemistry: Secondary | ICD-10-CM | POA: Diagnosis not present

## 2021-05-23 DIAGNOSIS — M19042 Primary osteoarthritis, left hand: Secondary | ICD-10-CM

## 2021-05-23 DIAGNOSIS — M19041 Primary osteoarthritis, right hand: Secondary | ICD-10-CM | POA: Diagnosis not present

## 2021-05-23 DIAGNOSIS — D696 Thrombocytopenia, unspecified: Secondary | ICD-10-CM | POA: Diagnosis not present

## 2021-05-23 NOTE — Progress Notes (Signed)
Office Visit Note  Patient: Joel Staples Chamberlin Sr.             Date of Birth: 14-Mar-1946           MRN: [id number]             PCP: Lindell Spar, MD Referring: Lindell Spar, MD Visit Date: 05/23/2021 Occupation: '@[email or web address]' @  Subjective:  Discuss lab work   History of Present Illness: Joel Hereford Melberg Sr. is a 76 y.o. male with history of seropositive rheumatoid arthritis and osteoarthritis.  He is on IV orencia 750 mg infusions every 28 days.  His most recent infusion was on 05/17/21.  Patient had updated lab work at that time which revealed low potassium and elevated LFTs.  He reports that prior to his infusion he experienced some vomiting which she attributed to his hiatal hernia.  He also was experiencing symptoms of illness for respiratory tract infection.  He was taking TheraFlu and Alka-Seltzer for several days for symptomatic relief.  He was advised by Dr. Estanislado Pandy to be seen in the ED or urgent care ASAP but he ended up seeing his PCP, Dr. Posey Pronto on 05/18/21.  He will be having updated lab work today.   He denies any signs or symptoms of a rheumatoid arthritis flare.  He denies any joint pain.  He has some stiffness in both hands and the right great toe.  He typically does not take OTC products for symptomatic relief.  He tries to remain active and has started working 2 days per week.    Activities of Daily Living:  Patient reports morning stiffness for all day. Patient Reports nocturnal pain.  Difficulty dressing/grooming: Denies Difficulty climbing stairs: Denies Difficulty getting out of chair: Denies Difficulty using hands for taps, buttons, cutlery, and/or writing: Denies  Review of Systems  Constitutional:  Positive for fatigue.  HENT:  Positive for mouth dryness and nose dryness. Negative for mouth sores.   Eyes:  Negative for pain, itching and dryness.  Respiratory:  Negative for shortness of breath and difficulty breathing.   Cardiovascular:  Negative for chest pain and  palpitations.  Gastrointestinal:  Negative for blood in stool, constipation and diarrhea.  Endocrine: Negative for increased urination.  Genitourinary:  Negative for difficulty urinating.  Musculoskeletal:  Positive for joint pain, joint pain, joint swelling, myalgias, morning stiffness, muscle tenderness and myalgias.  Skin:  Positive for rash. Negative for color change.  Allergic/Immunologic: Negative for susceptible to infections.  Neurological:  Negative for dizziness, numbness, headaches, memory loss and weakness.  Hematological:  Negative for bruising/bleeding tendency.  Psychiatric/Behavioral:  Negative for confusion.    PMFS History:  Patient Active Problem List   Diagnosis Date Noted   Gastroesophageal reflux disease 05/18/2021   Type 2 diabetes mellitus with other specified complication ([place]) 79/07/8331   Long term (current) use of insulin ([place]) 05/18/2021   Obesity 05/18/2021   AKI (acute kidney injury) ([place]) 05/18/2021   Elevated transaminase level 05/18/2021   Tear of left rotator cuff 09/24/2017   DDD (degenerative disc disease), cervical 10/31/2016   Primary osteoarthritis of both hands 10/31/2016   Primary osteoarthritis of both feet 10/31/2016   High risk medication use 07/06/2016   Rheumatoid arthritis involving multiple sites with positive rheumatoid factor ([place]) 06/02/2016   Contracture, elbow, left 06/02/2016   Osteoarthritis of right knee 08/27/2015   S/P TKR (total knee replacement), left 08/27/2015   Essential hypertension 05/11/2015   CAD (coronary artery disease) 04/02/2012  Sinus bradycardia    Hyperlipidemia     Past Medical History:  Diagnosis Date   Anemia    Arthritis    "all over"   Bradycardia    Chronic bronchitis ([place])    "get it just about q yr" (03/17/2014)   Daily headache    "here lately" (03/17/2014); relates to sinuses   GERD (gastroesophageal reflux disease)    Hard of hearing    hearing aides bilat   Hiatal hernia    History of  blood transfusion 2008   "related to OR"   History of kidney stones    Hyperlipidemia    Hypertension    Pneumonia 1972 X 1   Rheumatoid arthritis ([place])    Type II diabetes mellitus ([place])    Wears glasses     Family History  Problem Relation Age of Onset   Cancer Father        prostate   Past Surgical History:  Procedure Laterality Date   broken finger      surgical repaired left hand 2nd finger    cyst removed      per left knee/posteriorly   CYSTECTOMY Left 2009   "behind knee"   INGUINAL HERNIA REPAIR Right ?2010   JOINT REPLACEMENT     KNEE ARTHROSCOPY Bilateral 1980's   PARTIAL KNEE ARTHROPLASTY Right 08/27/2015   Procedure: RIGHT UNICOMPARTMENTAL KNEE ARTHROPLASTY;  Surgeon: Mcarthur Rossetti, MD;  Location: [facility];  Service: Orthopedics;  Laterality: Right;   REVISION TOTAL KNEE ARTHROPLASTY Left 2008   SHOULDER SURGERY Left 2019   TOTAL KNEE ARTHROPLASTY Left 2003   Social History   Social History Narrative   Patient lives at home with family.   Caffeine Use: occasionally   Immunization History  Administered Date(s) Administered   Pneumococcal Conjugate-13 01/13/2014   Pneumococcal Polysaccharide-23 05/15/2006, 09/16/2012, 03/29/2018     Objective: Vital Signs: BP (!) 143/73 (BP Location: Left Arm, Patient Position: Sitting, Cuff Size: Large)    Pulse (!) 46    Ht '5\' 6"'  (1.676 m)    Wt 205 lb 3.2 oz (93.1 kg)    BMI 33.12 kg/m    Physical Exam Vitals and nursing note reviewed.  Constitutional:      Appearance: He is well-developed.  HENT:     Head: Normocephalic and atraumatic.  Eyes:     Conjunctiva/sclera: Conjunctivae normal.     Pupils: Pupils are equal, round, and reactive to light.  Pulmonary:     Effort: Pulmonary effort is normal.  Abdominal:     Palpations: Abdomen is soft.  Musculoskeletal:     Cervical back: Normal range of motion and neck supple.  Skin:    General: Skin is warm and dry.     Capillary Refill: Capillary refill  takes less than 2 seconds.  Neurological:     Mental Status: He is alert and oriented to person, place, and time.  Psychiatric:        Behavior: Behavior normal.     Musculoskeletal Exam: C-spine has slightly limited ROM with lateral rotation.  Postural thoracic kyphosis noted.  Shoulder joints, elbow joints, wrist joints, MCPs, PIPs, and DIPs good ROM with no synovitis.  Complete fist formation bilaterally.  Hip joints have good ROM with no discomfort. Right partial knee replacement has painful ROM.  Left knee joint has good ROM with no discomfort.  Ankle joints have good ROM with no tenderness or joint swelling.   CDAI Exam: CDAI Score: 0.4  Patient Global: 2  mm; Provider Global: 2 mm Swollen: 0 ; Tender: 0  Joint Exam 05/23/2021   No joint exam has been documented for this visit   There is currently no information documented on the homunculus. Go to the Rheumatology activity and complete the homunculus joint exam.  Investigation: No additional findings.  Imaging: No results found.  Recent Labs: Lab Results  Component Value Date   WBC 6.9 05/17/2021   HGB 14.8 05/17/2021   PLT 147 (L) 05/17/2021   NA 136 05/17/2021   K 2.8 (L) 05/17/2021   CL 98 05/17/2021   CO2 28 05/17/2021   GLUCOSE 136 (H) 05/17/2021   BUN 14 05/17/2021   CREATININE 1.40 (H) 05/17/2021   BILITOT 1.6 (H) 05/17/2021   ALKPHOS 357 (H) 05/17/2021   AST 251 (H) 05/17/2021   ALT 467 (H) 05/17/2021   PROT 6.4 (L) 05/17/2021   ALBUMIN 3.4 (L) 05/17/2021   CALCIUM 8.5 (L) 05/17/2021   GFRAA >60 01/26/2020   QFTBGOLDPLUS Negative 07/12/2020    Speciality Comments: Orencia 780m every 4 weeks TB gold negative on Jan 2019  Procedures:  No procedures performed Allergies: Codeine, Lipitor [atorvastatin], Invokana [canagliflozin], Losartan, Roxicodone [oxycodone], and Vicodin [hydrocodone-acetaminophen]   Assessment / Plan:     Visit Diagnoses: Rheumatoid arthritis involving multiple sites with positive  rheumatoid factor ([place]: He has no joint tenderness or synovitis on examination today.  He has not had any signs or symptoms of a rheumatoid arthritis flare.  His rheumatoid arthritis has been well controlled on the current treatment regimen of Orencia 750 mg IV infusions every 28 days.  His most recent infusion was administered on 05/17/2021.  He continues to tolerate Orencia without any side effects.  He has not had any difficulty with ADLs.  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 He presented today to discuss lab work drawn with his infusion on 05/17/21.  Potassium was 2.8, AST was 251, and ALT was 467.  The patient was taking over-the-counter TheraFlu and Alka-Seltzer for several days prior to having lab work for the management of an upper respiratory tract infection.  He was experiencing intermittent vomiting as well which he attributed to his hiatal hernia.  He is no longer taking any over-the-counter products and his signs and symptoms of an infection have resolved.  He was evaluated by his PCP on 05/18/2021 and will be having updated lab work today for further evaluation. Discussed the importance of avoiding Tylenol, NSAID, and alcohol use.  I also discussed the importance of postponing Orencia infusions in the future if he develops signs or symptoms of an infection and to resume once the infection is completely resolved.  He voiced understanding.  We will continue to follow lab work closely.   He was advised to notify [place] he develops signs or symptoms of a flare.  He will follow up in the office in 3 months.   High risk medication use: IV Orencia 750 mg infusions every 28 days.  Most recent infusion was on 05/17/2021.  Lab work from 05/17/2021 was reviewed today in the office: Platelet count was 147, potassium was 2.8, creatinine was 1.40, calcium 8.5, AST 251, ALT 467, alk phos 357, total bilirubin 1.6, GFR 53.  He was evaluated by his PCP on 05/18/21.  He will be having updated lab work  today at his PCPs office.   He has been avoiding the use of OTC products.  He is no longer taking theraflu or Alka-Seltzer.  Discussed the importance of avoid tylenol, NSAIDs, and alcohol use. Discussed the importance of postponing Orencia infusions if he develops signs or symptoms of an infection and to resume once the infection has completely cleared. Association of heart disease with rheumatoid arthritis was discussed. Need to monitor blood pressure, cholesterol, and to exercise 30-60 minutes on daily basis was discussed.   Low blood potassium: Potassium was 2.8 on 05/17/2021.  According to the patient he was experiencing intermittent vomiting which she attributed to his hiatal hernia.  He has not had any diarrhea.  Lab work will be rechecked today by Dr. Posey Pronto.   Elevated LFTs: AST was 251 and ALT was 467.  Alk phos was 357 on 05/17/2021.  Lab work was drawn with a routine Orencia infusion.  He had been experiencing symptoms of an as per respiratory tract infection for which he had been taking TheraFlu and Alka-Seltzer for several days for symptomatic relief.  He was evaluated by Dr. Posey Pronto on 05/18/2021.  Office visit note was reviewed.  He has discontinued use of over-the-counter products.  He will be going for updated lab work today and will be seeing Dr. Posey Pronto on Thursday for further evaluation and management.  Primary osteoarthritis of both hands: He has PIP and DIP thickening consistent with osteoarthritis of both hands.  CMC joint thickening noted bilaterally.  Discussed the importance of joint protection and muscle strengthening.  Primary osteoarthritis of both feet: He experiences some pain and stiffness in the right great toe.  He has not noticed any inflammation.  He has good range of motion of both ankle joints with no tenderness or joint swelling.  He is wearing proper fitting shoes.  S/P TKR (total knee replacement), left: Doing well.  He has good range of motion of the left knee replacement  on examination today.  Status post right partial knee replacement: Chronic pain.  He has painful range of motion on examination today.  He is planning on having a right total knee replacement this summer.   DDD (degenerative disc disease), cervical: He has limited range of motion with lateral rotation on examination today.  He has not experiencing any increased discomfort in his neck at this time.  No symptoms of radiculopathy at this time.  Other fatigue: Stable.  Discussed the importance of regular exercise.  Chronic insomnia: Overall he has been sleeping well at night.  Discussed the importance of good sleep hygiene.  Other medical conditions are listed as follows:  History of coronary artery disease  History of diabetes mellitus  History of hypertension: Discussed the importance of close blood pressure monitoring.  History of hyperlipidemia  Orders: No orders of the defined types were placed in this encounter.  No orders of the defined types were placed in this encounter.     Follow-Up Instructions: Return in about 3 months (around 08/21/2021) for Rheumatoid arthritis, Osteoarthritis.   Ofilia Neas, PA-C  Note - This record has been created using Dragon software.  Chart creation errors have been sought, but Till[place] not always  have been located. Such creation errors do not reflect on  the standard of medical care.

## 2021-05-24 LAB — CBC
Hematocrit: 38 % (ref 37.5–51.0)
Hemoglobin: 12.9 g/dL — ABNORMAL LOW (ref 13.0–17.7)
MCH: 30.4 pg (ref 26.6–33.0)
MCHC: 33.9 g/dL (ref 31.5–35.7)
MCV: 90 fL (ref 79–97)
Platelets: 304 10*3/uL (ref 150–450)
RBC: 4.24 x10E6/uL (ref 4.14–5.80)
RDW: 12.9 % (ref 11.6–15.4)
WBC: 5.8 10*3/uL (ref 3.4–10.8)

## 2021-05-24 LAB — CMP14+EGFR
ALT: 90 IU/L — ABNORMAL HIGH (ref 0–44)
AST: 38 IU/L (ref 0–40)
Albumin/Globulin Ratio: 2.4 — ABNORMAL HIGH (ref 1.2–2.2)
Albumin: 4.1 g/dL (ref 3.7–4.7)
Alkaline Phosphatase: 244 IU/L — ABNORMAL HIGH (ref 44–121)
BUN/Creatinine Ratio: 17 (ref 10–24)
BUN: 20 mg/dL (ref 8–27)
Bilirubin Total: 0.8 mg/dL (ref 0.0–1.2)
CO2: 26 mmol/L (ref 20–29)
Calcium: 8.5 mg/dL — ABNORMAL LOW (ref 8.6–10.2)
Chloride: 104 mmol/L (ref 96–106)
Creatinine, Ser: 1.15 mg/dL (ref 0.76–1.27)
Globulin, Total: 1.7 g/dL (ref 1.5–4.5)
Glucose: 114 mg/dL — ABNORMAL HIGH (ref 70–99)
Potassium: 4.3 mmol/L (ref 3.5–5.2)
Sodium: 143 mmol/L (ref 134–144)
Total Protein: 5.8 g/dL — ABNORMAL LOW (ref 6.0–8.5)
eGFR: 67 mL/min/{1.73_m2} (ref >59)

## 2021-05-25 ENCOUNTER — Other Ambulatory Visit: Payer: Self-pay

## 2021-05-26 ENCOUNTER — Encounter: Payer: Self-pay | Admitting: Internal Medicine

## 2021-05-26 ENCOUNTER — Ambulatory Visit (INDEPENDENT_AMBULATORY_CARE_PROVIDER_SITE_OTHER): Payer: Medicare Other | Admitting: Internal Medicine

## 2021-05-26 VITALS — BP 132/78 | HR 43 | Resp 18 | Ht 66.0 in | Wt 206.0 lb

## 2021-05-26 DIAGNOSIS — R7401 Elevation of levels of liver transaminase levels: Secondary | ICD-10-CM

## 2021-05-26 DIAGNOSIS — N179 Acute kidney failure, unspecified: Secondary | ICD-10-CM | POA: Diagnosis not present

## 2021-05-26 DIAGNOSIS — E876 Hypokalemia: Secondary | ICD-10-CM

## 2021-05-26 DIAGNOSIS — Z2821 Immunization not carried out because of patient refusal: Secondary | ICD-10-CM | POA: Diagnosis not present

## 2021-05-26 NOTE — Assessment & Plan Note (Signed)
CMP reviewed, AST/ALT and ALP trending down --he is currently asymptomatic He does report URTI in the last week, and was taking Alka-Seltzer and Tylenol concurrently, which could lead to acute rise in transaminase Could be elevated as acute phase reactant from recent infection as well

## 2021-05-26 NOTE — Progress Notes (Signed)
Acute Office Visit  Subjective:    Patient ID: Joel Meech Larranaga Sr., male    DOB: [birth year], 76 y.o.   MRN: [id number]  Chief Complaint  Patient presents with   Follow-up    1 week follow up elevated transaminase level     HPI Patient is in today for f/u of elevated transaminase levels, AKI and hypokalemia. His CMP from this week shows downtrending AST, ALT and ALP.  His serum creatinine and BUN has improved as well.  His serum potassium level is WNL now.  He denies any nausea, vomiting, diarrhea or abdominal pain.  Denies any fever, chills.  He has stopped taking Tylenol since last visit.  Past Medical History:  Diagnosis Date   Anemia    Arthritis    "all over"   Bradycardia    Chronic bronchitis ([place])    "get it just about q yr" (03/17/2014)   Daily headache    "here lately" (03/17/2014); relates to sinuses   GERD (gastroesophageal reflux disease)    Hard of hearing    hearing aides bilat   Hiatal hernia    History of blood transfusion 2008   "related to OR"   History of kidney stones    Hyperlipidemia    Hypertension    Pneumonia 1972 X 1   Rheumatoid arthritis ([place])    Type II diabetes mellitus ([place])    Wears glasses     Past Surgical History:  Procedure Laterality Date   broken finger      surgical repaired left hand 2nd finger    cyst removed      per left knee/posteriorly   CYSTECTOMY Left 2009   "behind knee"   INGUINAL HERNIA REPAIR Right ?2010   JOINT REPLACEMENT     KNEE ARTHROSCOPY Bilateral 1980's   PARTIAL KNEE ARTHROPLASTY Right 08/27/2015   Procedure: RIGHT UNICOMPARTMENTAL KNEE ARTHROPLASTY;  Surgeon: Mcarthur Rossetti, MD;  Location: [facility];  Service: Orthopedics;  Laterality: Right;   REVISION TOTAL KNEE ARTHROPLASTY Left 2008   SHOULDER SURGERY Left 2019   TOTAL KNEE ARTHROPLASTY Left 2003    Family History  Problem Relation Age of Onset   Cancer Father        prostate    Social History   Socioeconomic History   Marital status:  Married    Spouse name: Abe People   Number of children: 3   Years of education: College   Highest education level: Not on file  Occupational History    Employer: UNEMPLOYED  Tobacco Use   Smoking status: Former    Packs/day: 1.00    Years: 5.00    Pack years: 5.00    Types: Cigarettes, Cigars    Quit date: 05/15/1968    Years since quitting: 53.0   Smokeless tobacco: Never  Vaping Use   Vaping Use: Never used  Substance and Sexual Activity   Alcohol use: No    Alcohol/week: 0.0 standard drinks   Drug use: Never   Sexual activity: Yes  Other Topics Concern   Not on file  Social History Narrative   Patient lives at home with family.   Caffeine Use: occasionally   Social Determinants of Health   Financial Resource Strain: Not on file  Food Insecurity: Not on file  Transportation Needs: Not on file  Physical Activity: Not on file  Stress: Not on file  Social Connections: Not on file  Intimate Partner Violence: Not on file    Outpatient Medications Prior to  Visit  Medication Sig Dispense Refill   amLODipine (NORVASC) 5 MG tablet Take 1 tablet (5 mg total) by mouth daily. 90 tablet 3   Ascorbic Acid (VITAMIN C PO) Take by mouth daily.     aspirin EC 81 MG tablet Take 81 mg by mouth daily.     Calcium Carbonate-Vit D-Min (CALCIUM 1200 PO) Take by mouth daily.     Cholecalciferol (VITAMIN D3 PO) Take by mouth daily.     HUMALOG 100 UNIT/ML injection INJ UP TO 65 UNI [place] VIA INSULIN PUMP QD     insulin aspart (NOVOLOG) 100 UNIT/ML FlexPen Inject 1 Units into the skin 3 (three) times daily with meals. 1 unit per hour     KRILL OIL 1000 MG CAPS Take 1,000 mg by mouth daily.     Lancets (ONETOUCH ULTRASOFT) lancets FOR USE WHEN CHECKING BLOOD SUGAR QID.  3   loratadine (CLARITIN) 10 MG tablet Take 10 mg by mouth daily.     Misc Natural Products (TART CHERRY ADVANCED PO) Take by mouth daily.     Multiple Vitamin (MULTIVITAMIN WITH MINERALS) TABS Take 1 tablet by mouth daily.      Multiple Vitamins-Minerals (ZINC PO) Take by mouth daily.     ONE TOUCH ULTRA TEST test strip USE WHEN CHECKING BLOOD SUGAR QID.  3   pantoprazole (PROTONIX) 40 MG tablet TAKE 1 TABLET DAILY. NEED OFFICE VISIT AND LABS BEFORE FURTHER REFILLS 90 tablet 0   POTASSIUM PO Take by mouth daily.     pravastatin (PRAVACHOL) 40 MG tablet TAKE 1 TABLET EVERY EVENING 90 tablet 3   Pyridoxine HCl (VITAMIN B-6 PO) Take by mouth daily.     ramipril (ALTACE) 5 MG capsule TAKE 1 CAPSULE EVERY DAY 90 capsule 3   vitamin E 400 UNIT capsule Take 400 Units by mouth daily.     No facility-administered medications prior to visit.    Allergies  Allergen Reactions   Codeine Swelling   Lipitor [Atorvastatin] Other (See Comments)    Muscle aches    Invokana [Canagliflozin] Other (See Comments)    Stomach upset    Losartan Nausea And Vomiting   Roxicodone [Oxycodone] Itching   Vicodin [Hydrocodone-Acetaminophen] Itching    Review of Systems  Constitutional:  Negative for chills and fever.  HENT:  Negative for congestion and sore throat.   Eyes:  Negative for pain and discharge.  Respiratory:  Negative for cough and shortness of breath.   Cardiovascular:  Negative for chest pain and palpitations.  Gastrointestinal:  Negative for constipation, diarrhea, nausea and vomiting.  Endocrine: Negative for polydipsia and polyuria.  Genitourinary:  Negative for dysuria and hematuria.  Musculoskeletal:  Positive for arthralgias and back pain. Negative for neck pain and neck stiffness.  Skin:  Negative for rash.  Neurological:  Negative for dizziness, weakness, numbness and headaches.  Psychiatric/Behavioral:  Negative for agitation and behavioral problems.       Objective:    Physical Exam Vitals reviewed.  Constitutional:      General: He is not in acute distress.    Appearance: He is obese. He is not diaphoretic.  HENT:     Head: Normocephalic and atraumatic.     Nose: Nose normal.     Mouth/Throat:      Mouth: Mucous membranes are moist.  Eyes:     General: No scleral icterus.    Extraocular Movements: Extraocular movements intact.  Cardiovascular:     Rate and Rhythm: Normal rate and regular rhythm.  Pulses: Normal pulses.     Heart sounds: Normal heart sounds. No murmur heard. Pulmonary:     Breath sounds: Normal breath sounds. No wheezing or rales.  Abdominal:     Palpations: Abdomen is soft.     Tenderness: There is no abdominal tenderness. There is no guarding or rebound.  Musculoskeletal:     Cervical back: Neck supple. No tenderness.     Right lower leg: No edema.     Left lower leg: No edema.  Skin:    General: Skin is warm.     Findings: No rash.  Neurological:     General: No focal deficit present.     Mental Status: He is alert and oriented to person, place, and time.     Sensory: No sensory deficit.     Motor: No weakness.  Psychiatric:        Mood and Affect: Mood normal.        Behavior: Behavior normal.    BP 132/78 (BP Location: Left Arm, Patient Position: Sitting, Cuff Size: Normal)    Pulse (!) 43    Resp 18    Ht 5\' 6"  (1.676 m)    Wt 206 lb (93.4 kg)    SpO2 98%    BMI 33.25 kg/m  Wt Readings from Last 3 Encounters:  05/26/21 206 lb (93.4 kg)  05/23/21 205 lb 3.2 oz (93.1 kg)  05/18/21 206 lb 0.6 oz (93.5 kg)        Assessment & Plan:   Problem List Items Addressed This Visit       Genitourinary   AKI (acute kidney injury) ([place])    Last CMP reviewed - AKI resolved now Could be from dehydration from recent infection Advised to improve hydration for now        Other   Elevated transaminase level - Primary    CMP reviewed, AST/ALT and ALP trending down --he is currently asymptomatic He does report URTI in the last week, and was taking Alka-Seltzer and Tylenol concurrently, which could lead to acute rise in transaminase Could be elevated as acute phase reactant from recent infection as well      Other Visit Diagnoses     Hypokalemia      Resolved - Takes OTC potassium supplement    Refused influenza vaccine            No orders of the defined types were placed in this encounter.    Lindell Spar, MD

## 2021-05-26 NOTE — Assessment & Plan Note (Signed)
Last CMP reviewed - AKI resolved now Could be from dehydration from recent infection Advised to improve hydration for now

## 2021-05-26 NOTE — Patient Instructions (Signed)
Please continue to take medications as prescribed.  Please continue to follow low carb diet and ambulate as tolerated. 

## 2021-06-02 ENCOUNTER — Other Ambulatory Visit: Payer: Self-pay

## 2021-06-03 ENCOUNTER — Other Ambulatory Visit: Payer: Self-pay

## 2021-06-03 MED ORDER — PRAVASTATIN SODIUM 40 MG PO TABS: 40.0000 mg | ORAL_TABLET | Freq: Every evening | ORAL | 3 refills | Status: DC

## 2021-06-07 ENCOUNTER — Ambulatory Visit: Payer: Medicare Other | Admitting: Internal Medicine

## 2021-06-10 ENCOUNTER — Other Ambulatory Visit: Payer: Self-pay | Admitting: Pharmacist

## 2021-06-10 DIAGNOSIS — Z111 Encounter for screening for respiratory tuberculosis: Secondary | ICD-10-CM

## 2021-06-10 DIAGNOSIS — M0579 Rheumatoid arthritis with rheumatoid factor of multiple sites without organ or systems involvement: Secondary | ICD-10-CM

## 2021-06-10 DIAGNOSIS — Z79899 Other long term (current) drug therapy: Secondary | ICD-10-CM

## 2021-06-10 NOTE — Progress Notes (Signed)
Next infusion not yet scheduled for Orencia IV and due for updated orders. Diagnosis: RA  Dose: 750mg  every 28 days (appropriate based on last recorded weight of 93.4 kg)  Last Clinic Visit: 05/23/21 Next Clinic Visit: 09/08/21  Last infusion: 05/17/21  Labs: CBC and CMP on 05/23/21 - stable, LFTs significantly decreased TB Gold: negative on 07/12/21   Orders placed for Orencia IV x 3 doses along with premedication of acetaminophen and diphenhydramine to be administered 30 minutes before medication infusion.  Standing CBC with diff/platelet and CMP with GFR orders placed to be drawn every 2 months.  Next TB gold due with upcoming infusion  Knox Saliva, PharmD, MPH, BCPS Clinical Pharmacist (Rheumatology and Pulmonology)

## 2021-06-13 ENCOUNTER — Ambulatory Visit (INDEPENDENT_AMBULATORY_CARE_PROVIDER_SITE_OTHER): Payer: Medicare Other

## 2021-06-13 ENCOUNTER — Telehealth: Payer: Self-pay | Admitting: Pharmacy Technician

## 2021-06-13 ENCOUNTER — Ambulatory Visit (INDEPENDENT_AMBULATORY_CARE_PROVIDER_SITE_OTHER): Payer: Medicare Other | Admitting: Physician Assistant

## 2021-06-13 ENCOUNTER — Encounter: Payer: Self-pay | Admitting: Physician Assistant

## 2021-06-13 ENCOUNTER — Other Ambulatory Visit: Payer: Self-pay

## 2021-06-13 DIAGNOSIS — M545 Low back pain, unspecified: Secondary | ICD-10-CM

## 2021-06-13 DIAGNOSIS — M79674 Pain in right toe(s): Secondary | ICD-10-CM

## 2021-06-13 MED ORDER — TIZANIDINE HCL 2 MG PO CAPS: 2.0000 mg | ORAL_CAPSULE | Freq: Three times a day (TID) | ORAL | 1 refills | Status: DC

## 2021-06-13 NOTE — Progress Notes (Signed)
Office Visit Note   Patient: Joel Warne Nanda Sr.           Date of Birth: December 13, 1945           MRN: [id number] Visit Date: 06/13/2021              Requested by: Lindell Spar, MD [address] [place],  [place] [address] PCP: Lindell Spar, MD   Assessment & Plan: Visit Diagnoses:  1. Low back pain, unspecified back pain laterality, unspecified chronicity, unspecified whether sciatica present   2. Great toe pain, right     Plan: Discussed with him shoewear he needs a stiff bottom shoe to help with the right great toe arthritis..  Given the amount of crepitus on exam even the radiographs do  arthritis he has some  recommend Voltaren gel 2 g 4 times daily to the right great toe.  In regards to his back offered formal physical therapy he defers.  Therefore we will place him on Zanaflex and is given back exercises.  If he develops radicular symptoms or pain becomes worse he will let us know and we can always further work this up.  Questions were encouraged and answered.  Follow-up as needed  Follow-Up Instructions: Return if symptoms worsen or fail to improve.   Orders:  Orders Placed This Encounter  Procedures   XR Lumbar Spine 2-3 Views   Meds ordered this encounter  Medications   tizanidine (ZANAFLEX) 2 MG capsule    Sig: Take 1 capsule (2 mg total) by mouth 3 (three) times daily.    Dispense:  40 capsule    Refill:  1      Procedures: No procedures performed   Clinical Data: No additional findings.   Subjective: Chief Complaint  Patient presents with   Right Leg - Pain    HPI Mr. Trivedi 76 year old male well-known to Dr. Ninfa Linden service comes in today with right great toe pain and low back pain.  He states he has been seen by Dr. Estanislado Pandy  for the toe and had radiographs taken of his foot.  Radiographs of the right foot are reviewed and shows in a joint narrowing with dorsal spurring and osteoarthritis. He is having low back pain since  this Friday.  He reports he was pulling on the commode and strained something in his low back.  He is having no real radicular symptoms.  However he does note some pain that started while driving from his right knee down into the calf.  No weight and pain in regards to his back.  He has tried heat to the low back.  He is diabetic with hemoglobin A1c was 6.8.   Review of Systems See HPI  Objective: Vital Signs: There were no vitals taken for this visit.  Physical Exam General well-developed well-nourished male no acute distress. Ortho Exam Lower extremities: 5 5 strength throughout lower extremities against resistance.  Negative straight leg raise bilaterally.  He is able to touch his toes.  Limited extension lumbar spine.  Tenderness lower lumbar right paraspinous region.  Sensation grossly intact bilateral feet.  Dorsal pedal pulses are 2+.  Right great toe crepitus with passive range of motion to the MP joint and some discomfort.  No acute erythema about the right great toe. Specialty Comments:  No specialty comments available.  Imaging: XR Lumbar Spine 2-3 Views  Result Date: 06/13/2021 Lumbar  spine 2 views: Degenerative changes throughout.  No acute fractures or acute findings.  Slight retrolisthesis L2 on L3.  Disc base narrowing L2-3 unchanged from prior films.  Slight scoliosis.    PMFS History: Patient Active Problem List   Diagnosis Date Noted   Gastroesophageal reflux disease 05/18/2021   Type 2 diabetes mellitus with other specified complication ([place]) 48/25/0037   Long term (current) use of insulin ([place]) 05/18/2021   Obesity 05/18/2021   AKI (acute kidney injury) ([place]) 05/18/2021   Elevated transaminase level 05/18/2021   Tear of left rotator cuff 09/24/2017   DDD (degenerative disc disease), cervical 10/31/2016   Primary osteoarthritis of both hands 10/31/2016   Primary osteoarthritis of both feet 10/31/2016   High risk medication use 07/06/2016   Rheumatoid arthritis  involving multiple sites with positive rheumatoid factor ([place]) 06/02/2016   Contracture, elbow, left 06/02/2016   Osteoarthritis of right knee 08/27/2015   S/P TKR (total knee replacement), left 08/27/2015   Essential hypertension 05/11/2015   CAD (coronary artery disease) 04/02/2012   Sinus bradycardia    Hyperlipidemia    Past Medical History:  Diagnosis Date   Anemia    Arthritis    "all over"   Bradycardia    Chronic bronchitis ([place])    "get it just about q yr" (03/17/2014)   Daily headache    "here lately" (03/17/2014); relates to sinuses   GERD (gastroesophageal reflux disease)    Hard of hearing    hearing aides bilat   Hiatal hernia    History of blood transfusion 2008   "related to OR"   History of kidney stones    Hyperlipidemia    Hypertension    Pneumonia 1972 X 1   Rheumatoid arthritis ([place])    Type II diabetes mellitus ([place])    Wears glasses     Family History  Problem Relation Age of Onset   Cancer Father        prostate    Past Surgical History:  Procedure Laterality Date   broken finger      surgical repaired left hand 2nd finger    cyst removed      per left knee/posteriorly   CYSTECTOMY Left 2009   "behind knee"   INGUINAL HERNIA REPAIR Right ?2010   JOINT REPLACEMENT     KNEE ARTHROSCOPY Bilateral 1980's   PARTIAL KNEE ARTHROPLASTY Right 08/27/2015   Procedure: RIGHT UNICOMPARTMENTAL KNEE ARTHROPLASTY;  Surgeon: Mcarthur Rossetti, MD;  Location: [facility];  Service: Orthopedics;  Laterality: Right;   REVISION TOTAL KNEE ARTHROPLASTY Left 2008   SHOULDER SURGERY Left 2019   TOTAL KNEE ARTHROPLASTY Left 2003   Social History   Occupational History    Employer: UNEMPLOYED  Tobacco Use   Smoking status: Former    Packs/day: 1.00    Years: 5.00    Pack years: 5.00    Types: Cigarettes, Cigars    Quit date: 05/15/1968    Years since quitting: 53.1   Smokeless tobacco: Never  Vaping Use   Vaping Use: Never used  Substance and Sexual  Activity   Alcohol use: No    Alcohol/week: 0.0 standard drinks   Drug use: Never   Sexual activity: Yes

## 2021-06-13 NOTE — Telephone Encounter (Signed)
Called the patient to verify benefits for 2023. Pt currently received infusions that [facility] require a pre-certification. Medicare covers 80% of the infusion and no authorization is required, and the [place] [facility] will cover the 20% of the cost that was not paid for by Medicare as long as Medicare covered the medication.

## 2021-06-14 ENCOUNTER — Telehealth: Payer: Self-pay

## 2021-06-14 ENCOUNTER — Other Ambulatory Visit: Payer: Self-pay | Admitting: *Deleted

## 2021-06-14 NOTE — Telephone Encounter (Signed)
[facility] called has faxed over medicine for Potassium Chloride can be called in or faxed back phone # [phone] or fax # [phone].

## 2021-06-14 NOTE — Telephone Encounter (Signed)
Called pt to ask what mg of potassium he stated he buys this over the counter. Will not send in prescription per pt to pharmacy. He will call back if he ever needs [place] to send it in

## 2021-06-14 NOTE — Progress Notes (Signed)
Conference called with patient's wife and [facility]. Patient scheduled to receive Orencia infusion on 06/24/21.  Knox Saliva, PharmD, MPH, BCPS Clinical Pharmacist (Rheumatology and Pulmonology)

## 2021-06-14 NOTE — Progress Notes (Signed)
Called patient regarding Orencia infusion. He states that he was not aware that infusion for Feb 2023 was not scheduled. He was provided with [facility] phone number to schedule infusion since he is due on or after 06/14/21  Will f/u to ensure scheduled  Knox Saliva, PharmD, MPH, BCPS Clinical Pharmacist (Rheumatology and Pulmonology)

## 2021-06-23 ENCOUNTER — Telehealth: Payer: Self-pay

## 2021-06-23 NOTE — Telephone Encounter (Addendum)
Patient has Medicare A & B as well as Plan F through [employer]. Plan F is active as of 06/23/21.   Medicare covers 80% of the infusion and no authorization is required, and the supplement would cover the 20% of the cost that was not paid for by Medicare as long as Medicare covered the medication.    [facility] updated for 2023.

## 2021-06-24 ENCOUNTER — Ambulatory Visit (HOSPITAL_COMMUNITY)
Admission: RE | Admit: 2021-06-24 | Discharge: 2021-06-24 | Disposition: A | Discharge status: Home / Self Care | Payer: Medicare Other | Source: Ambulatory Visit | Attending: Rheumatology | Admitting: Rheumatology

## 2021-06-24 ENCOUNTER — Other Ambulatory Visit: Payer: Self-pay

## 2021-06-24 DIAGNOSIS — M0579 Rheumatoid arthritis with rheumatoid factor of multiple sites without organ or systems involvement: Secondary | ICD-10-CM | POA: Insufficient documentation

## 2021-06-24 MED ORDER — DIPHENHYDRAMINE HCL 25 MG PO CAPS: 25.0000 mg | ORAL_CAPSULE | ORAL | Status: DC

## 2021-06-24 MED ORDER — SODIUM CHLORIDE 0.9 % IV SOLN
750.0000 mg | INTRAVENOUS | Status: DC
  Administered 2021-06-24: 750 mg via INTRAVENOUS
  Filled 2021-06-24: qty 30

## 2021-06-24 MED ORDER — ACETAMINOPHEN 325 MG PO TABS: 650.0000 mg | ORAL_TABLET | ORAL | Status: DC

## 2021-07-22 ENCOUNTER — Ambulatory Visit (HOSPITAL_COMMUNITY): Payer: Medicare Other

## 2021-07-25 ENCOUNTER — Encounter (HOSPITAL_COMMUNITY)
Admission: RE | Admit: 2021-07-25 | Discharge: 2021-07-25 | Disposition: A | Discharge status: Home / Self Care | Payer: Medicare Other | Source: Ambulatory Visit | Attending: Rheumatology | Admitting: Rheumatology

## 2021-07-25 DIAGNOSIS — Z111 Encounter for screening for respiratory tuberculosis: Secondary | ICD-10-CM | POA: Insufficient documentation

## 2021-07-25 DIAGNOSIS — M0579 Rheumatoid arthritis with rheumatoid factor of multiple sites without organ or systems involvement: Secondary | ICD-10-CM | POA: Insufficient documentation

## 2021-07-25 DIAGNOSIS — Z79899 Other long term (current) drug therapy: Secondary | ICD-10-CM | POA: Diagnosis not present

## 2021-07-25 LAB — CBC WITH DIFFERENTIAL/PLATELET
Abs Immature Granulocytes: 0.02 10*3/uL (ref 0.00–0.07)
Basophils Absolute: 0.1 10*3/uL (ref 0.0–0.1)
Basophils Relative: 1 %
Eosinophils Absolute: 0.2 10*3/uL (ref 0.0–0.5)
Eosinophils Relative: 3 %
HCT: 41 % (ref 39.0–52.0)
Hemoglobin: 14.2 g/dL (ref 13.0–17.0)
Immature Granulocytes: 0 %
Lymphocytes Relative: 31 %
Lymphs Abs: 2.2 10*3/uL (ref 0.7–4.0)
MCH: 30.9 pg (ref 26.0–34.0)
MCHC: 34.6 g/dL (ref 30.0–36.0)
MCV: 89.3 fL (ref 80.0–100.0)
Monocytes Absolute: 0.7 10*3/uL (ref 0.1–1.0)
Monocytes Relative: 10 %
Neutro Abs: 3.8 10*3/uL (ref 1.7–7.7)
Neutrophils Relative %: 55 %
Platelets: 248 10*3/uL (ref 150–400)
RBC: 4.59 MIL/uL (ref 4.22–5.81)
RDW: 13.2 % (ref 11.5–15.5)
WBC: 7 10*3/uL (ref 4.0–10.5)
nRBC: 0 % (ref 0.0–0.2)

## 2021-07-25 LAB — COMPREHENSIVE METABOLIC PANEL
ALT: 31 U/L (ref 0–44)
AST: 32 U/L (ref 15–41)
Albumin: 3.7 g/dL (ref 3.5–5.0)
Alkaline Phosphatase: 83 U/L (ref 38–126)
Anion gap: 10 (ref 5–15)
BUN: 20 mg/dL (ref 8–23)
CO2: 24 mmol/L (ref 22–32)
Calcium: 8.9 mg/dL (ref 8.9–10.3)
Chloride: 101 mmol/L (ref 98–111)
Creatinine, Ser: 1.18 mg/dL (ref 0.61–1.24)
GFR, Estimated: >60 mL/min (ref >60)
Glucose, Bld: 254 mg/dL — ABNORMAL HIGH (ref 70–99)
Potassium: 3.7 mmol/L (ref 3.5–5.1)
Sodium: 135 mmol/L (ref 135–145)
Total Bilirubin: 0.7 mg/dL (ref 0.3–1.2)
Total Protein: 6.4 g/dL — ABNORMAL LOW (ref 6.5–8.1)

## 2021-07-25 MED ORDER — SODIUM CHLORIDE 0.9 % IV SOLN
750.0000 mg | INTRAVENOUS | Status: DC
  Administered 2021-07-25: 750 mg via INTRAVENOUS
  Filled 2021-07-25: qty 30

## 2021-07-25 MED ORDER — ACETAMINOPHEN 325 MG PO TABS: 650.0000 mg | ORAL_TABLET | ORAL | Status: DC

## 2021-07-25 MED ORDER — DIPHENHYDRAMINE HCL 25 MG PO CAPS: 25.0000 mg | ORAL_CAPSULE | ORAL | Status: DC

## 2021-07-25 NOTE — Progress Notes (Signed)
CBC WNL.  Glucose is very elevated-254-please notify the patient.  ?Total protein is borderline low but has improved.  Rest of CMP WNL.

## 2021-07-27 LAB — QUANTIFERON-TB GOLD PLUS: QuantiFERON-TB Gold Plus: NEGATIVE

## 2021-07-27 LAB — QUANTIFERON-TB GOLD PLUS (RQFGPL)
QuantiFERON Mitogen Value: >10 IU/mL
QuantiFERON Nil Value: 0.08 IU/mL
QuantiFERON TB1 Ag Value: 0.07 IU/mL
QuantiFERON TB2 Ag Value: 0.05 IU/mL

## 2021-07-27 NOTE — Progress Notes (Signed)
TB gold negative

## 2021-07-28 ENCOUNTER — Ambulatory Visit: Payer: Medicare Other | Admitting: Internal Medicine

## 2021-08-02 DIAGNOSIS — I10 Essential (primary) hypertension: Secondary | ICD-10-CM | POA: Diagnosis not present

## 2021-08-02 DIAGNOSIS — N182 Chronic kidney disease, stage 2 (mild): Secondary | ICD-10-CM | POA: Diagnosis not present

## 2021-08-02 DIAGNOSIS — Z794 Long term (current) use of insulin: Secondary | ICD-10-CM | POA: Diagnosis not present

## 2021-08-02 DIAGNOSIS — E1165 Type 2 diabetes mellitus with hyperglycemia: Secondary | ICD-10-CM | POA: Diagnosis not present

## 2021-08-02 DIAGNOSIS — E1122 Type 2 diabetes mellitus with diabetic chronic kidney disease: Secondary | ICD-10-CM | POA: Diagnosis not present

## 2021-08-02 LAB — HEMOGLOBIN A1C: Hemoglobin A1C: 7.2

## 2021-08-10 ENCOUNTER — Ambulatory Visit (INDEPENDENT_AMBULATORY_CARE_PROVIDER_SITE_OTHER): Payer: Medicare Other | Admitting: Physician Assistant

## 2021-08-10 ENCOUNTER — Encounter: Payer: Self-pay | Admitting: Physician Assistant

## 2021-08-10 DIAGNOSIS — M79671 Pain in right foot: Secondary | ICD-10-CM

## 2021-08-10 DIAGNOSIS — M2021 Hallux rigidus, right foot: Secondary | ICD-10-CM | POA: Diagnosis not present

## 2021-08-10 MED ORDER — METHYLPREDNISOLONE ACETATE 40 MG/ML IJ SUSP
40.0000 mg | INTRAMUSCULAR | Status: AC | PRN
  Administered 2021-08-10: 40 mg via INTRA_ARTICULAR

## 2021-08-10 MED ORDER — LIDOCAINE HCL 1 % IJ SOLN
1.0000 mL | INTRAMUSCULAR | Status: AC | PRN
  Administered 2021-08-10: 1 mL

## 2021-08-10 NOTE — Progress Notes (Signed)
? ?  Procedure Note ? ?Patient: Joel Stimpson Kwong Sr.             ?Date of Birth: [birth year]           ?MRN: [id number]             ?Visit Date: 08/10/2021 ?HPI: Mr. Deutscher returns today due to right great toe pain.  He was last seen in January due to right great toe pain and was advised to try some conservative measures such as stiff toed shoe and Voltaren gel.  He states he still having significant pain in the right great toe.  No injury.  He is requesting injection at the right great toe MTP joint.  He is diabetic but reports his last hemoglobin A1c was 6.8. ? ?Physical exam: Right great toe he has tenderness with range of motion to the MTP joint and plantarflexion dorsiflexion causes pain.  There is no swelling erythema or ecchymosis about the right great toe. ? ?Procedures: ?Visit Diagnoses:  ?1. Hallux rigidus, right foot   ? ? ?Small Joint Inj: R great MTP on 08/10/2021 3:09 PM ?Medications: 1 mL lidocaine 1 %; 40 mg methylPREDNISolone acetate 40 MG/ML ? ? ? ?Plan: Recommend he continue to use topical anti-inflammatories over the MTP joint.  Continue to use a stiff bottom shoe.  He will follow-up with [place] as needed.  He tolerated the injection well today. ? ?

## 2021-08-20 ENCOUNTER — Other Ambulatory Visit: Payer: Self-pay

## 2021-08-20 ENCOUNTER — Emergency Department (HOSPITAL_BASED_OUTPATIENT_CLINIC_OR_DEPARTMENT_OTHER): Payer: Medicare Other

## 2021-08-20 ENCOUNTER — Encounter (HOSPITAL_BASED_OUTPATIENT_CLINIC_OR_DEPARTMENT_OTHER): Payer: Self-pay | Admitting: Emergency Medicine

## 2021-08-20 ENCOUNTER — Emergency Department (HOSPITAL_BASED_OUTPATIENT_CLINIC_OR_DEPARTMENT_OTHER)
Admission: EM | Admit: 2021-08-20 | Discharge: 2021-08-20 | Disposition: A | Discharge status: Home / Self Care | Payer: Medicare Other | Attending: Emergency Medicine | Admitting: Emergency Medicine

## 2021-08-20 DIAGNOSIS — Y93H2 Activity, gardening and landscaping: Secondary | ICD-10-CM | POA: Diagnosis not present

## 2021-08-20 DIAGNOSIS — S098XXA Other specified injuries of head, initial encounter: Secondary | ICD-10-CM

## 2021-08-20 DIAGNOSIS — W208XXA Other cause of strike by thrown, projected or falling object, initial encounter: Secondary | ICD-10-CM | POA: Insufficient documentation

## 2021-08-20 DIAGNOSIS — S199XXA Unspecified injury of neck, initial encounter: Secondary | ICD-10-CM | POA: Diagnosis not present

## 2021-08-20 DIAGNOSIS — M542 Cervicalgia: Secondary | ICD-10-CM

## 2021-08-20 DIAGNOSIS — S0990XA Unspecified injury of head, initial encounter: Secondary | ICD-10-CM | POA: Diagnosis not present

## 2021-08-20 DIAGNOSIS — Z794 Long term (current) use of insulin: Secondary | ICD-10-CM | POA: Diagnosis not present

## 2021-08-20 DIAGNOSIS — Z7982 Long term (current) use of aspirin: Secondary | ICD-10-CM | POA: Insufficient documentation

## 2021-08-20 DIAGNOSIS — S0003XA Contusion of scalp, initial encounter: Secondary | ICD-10-CM | POA: Insufficient documentation

## 2021-08-20 DIAGNOSIS — R55 Syncope and collapse: Secondary | ICD-10-CM | POA: Diagnosis not present

## 2021-08-20 DIAGNOSIS — R410 Disorientation, unspecified: Secondary | ICD-10-CM | POA: Diagnosis not present

## 2021-08-20 MED ORDER — IBUPROFEN 800 MG PO TABS
800.0000 mg | ORAL_TABLET | Freq: Once | ORAL | Status: AC
  Administered 2021-08-20: 800 mg via ORAL
  Filled 2021-08-20: qty 1

## 2021-08-20 MED ORDER — IBUPROFEN 600 MG PO TABS: 600.0000 mg | ORAL_TABLET | Freq: Four times a day (QID) | ORAL | 0 refills | Status: DC | PRN

## 2021-08-20 MED ORDER — HYDROCODONE-ACETAMINOPHEN 5-325 MG PO TABS: 1.0000 | ORAL_TABLET | Freq: Four times a day (QID) | ORAL | 0 refills | Status: DC | PRN

## 2021-08-20 MED ORDER — METHOCARBAMOL 500 MG PO TABS: 500.0000 mg | ORAL_TABLET | Freq: Once | ORAL | Status: DC

## 2021-08-20 NOTE — ED Provider Notes (Signed)
?[facility] ?Provider Note ? ? ?CSN: [id number] ?Arrival date & time: 08/20/21  8546 ? ?  ? ?History ? ?Chief Complaint  ?Patient presents with  ? Head Injury  ? ? ?Joel Boston Paules Sr. is a 76 y.o. male. ? ?Patient is a 76 yo male presenting for neck injury. Pt was cutting tree limbs on Thursday, approx 2-3 days ago when a tree limb fell on the top of his head. Admits to pain and swelling at the top of the skull but is mostly presenting for neck pain. Denies sensation or motor deficits in upper or lower limbs. Denies any repeat falls or injuries. No hx of blood thinners.  ? ?The history is provided by the patient. No language interpreter was used.  ?Head Injury ?Associated symptoms: neck pain   ?Associated symptoms: no seizures and no vomiting   ? ?  ? ?Home Medications ?Prior to Admission medications   ?Medication Sig Start Date End Date Taking? Authorizing Provider  ?Abatacept (ORENCIA IV) Inject 750 mg into the vein every 28 (twenty-eight) days.    [provider]  ?amLODipine (NORVASC) 5 MG tablet Take 1 tablet (5 mg total) by mouth daily. 09/17/18   Lelon Perla, MD  ?Ascorbic Acid (VITAMIN C PO) Take by mouth daily.    [provider]  ?aspirin EC 81 MG tablet Take 81 mg by mouth daily.    [provider]  ?Calcium Carbonate-Vit D-Min (CALCIUM 1200 PO) Take by mouth daily.    [provider]  ?Cholecalciferol (VITAMIN D3 PO) Take by mouth daily.    [provider]  ?HUMALOG 100 UNIT/ML injection INJ UP TO [address] VIA INSULIN PUMP QD 05/06/18   [provider]  ?insulin aspart (NOVOLOG) 100 UNIT/ML FlexPen Inject 1 Units into the skin 3 (three) times daily with meals. 1 unit per hour    [provider]  ?KRILL OIL 1000 MG CAPS Take 1,000 mg by mouth daily.    [provider]  ?Lancets (ONETOUCH ULTRASOFT) lancets FOR USE WHEN CHECKING BLOOD SUGAR QID. 05/20/16   [provider]  ?loratadine (CLARITIN) 10  MG tablet Take 10 mg by mouth daily.    [provider]  ?Misc Natural Products (TART CHERRY ADVANCED PO) Take by mouth daily.    [provider]  ?Multiple Vitamin (MULTIVITAMIN WITH MINERALS) TABS Take 1 tablet by mouth daily.    [provider]  ?Multiple Vitamins-Minerals (ZINC PO) Take by mouth daily.    [provider]  ?ONE TOUCH ULTRA TEST test strip USE WHEN CHECKING BLOOD SUGAR QID. 05/26/16   [provider]  ?pantoprazole (PROTONIX) 40 MG tablet TAKE 1 TABLET DAILY. NEED OFFICE VISIT AND LABS BEFORE FURTHER REFILLS 10/08/18   Susy Frizzle, MD  ?POTASSIUM PO Take by mouth daily.    [provider]  ?pravastatin (PRAVACHOL) 40 MG tablet Take 1 tablet (40 mg total) by mouth every evening. 06/03/21   Lindell Spar, MD  ?Pyridoxine HCl (VITAMIN B-6 PO) Take by mouth daily.    [provider]  ?ramipril (ALTACE) 5 MG capsule TAKE 1 CAPSULE EVERY DAY 01/02/18   Dena Billet B, PA-C  ?tizanidine (ZANAFLEX) 2 MG capsule Take 1 capsule (2 mg total) by mouth 3 (three) times daily. 06/13/21   Pete Pelt, PA-C  ?vitamin E 400 UNIT capsule Take 400 Units by mouth daily.    [provider]  ?   ? ?Allergies    ?  Codeine, Lipitor [atorvastatin], Invokana [canagliflozin], Losartan, Roxicodone [oxycodone], and Vicodin [hydrocodone-acetaminophen]   ? ?Review of Systems   ?Review of Systems  ?Constitutional:  Negative for chills and fever.  ?HENT:  Negative for ear pain and sore throat.   ?Eyes:  Negative for pain and visual disturbance.  ?Respiratory:  Negative for cough and shortness of breath.   ?Cardiovascular:  Negative for chest pain and palpitations.  ?Gastrointestinal:  Negative for abdominal pain and vomiting.  ?Genitourinary:  Negative for dysuria and hematuria.  ?Musculoskeletal:  Positive for neck pain. Negative for arthralgias and back pain.  ?Skin:  Negative for color change and rash.  ?Neurological:  Negative for seizures and  syncope.  ?All other systems reviewed and are negative. ? ?Physical Exam ?Updated Vital Signs ?BP (!) 153/72 (BP Location: Right Arm)   Pulse (!) 45   Temp 97.9 ?F (36.6 ?C)   Resp 18   SpO2 97%  ?Physical Exam ?Vitals and nursing note reviewed.  ?Constitutional:   ?   General: He is not in acute distress. ?   Appearance: He is well-developed.  ?HENT:  ?   Head: Normocephalic and atraumatic.  ?Eyes:  ?   Conjunctiva/sclera: Conjunctivae normal.  ?Cardiovascular:  ?   Rate and Rhythm: Normal rate and regular rhythm.  ?   Heart sounds: No murmur heard. ?Pulmonary:  ?   Effort: Pulmonary effort is normal. No respiratory distress.  ?   Breath sounds: Normal breath sounds.  ?Abdominal:  ?   Palpations: Abdomen is soft.  ?   Tenderness: There is no abdominal tenderness.  ?Musculoskeletal:     ?   General: No swelling.  ?   Cervical back: Neck supple. Tenderness and bony tenderness present.  ?   Thoracic back: No bony tenderness.  ?   Lumbar back: No bony tenderness.  ?Skin: ?   General: Skin is warm and dry.  ?   Capillary Refill: Capillary refill takes less than 2 seconds.  ? ?    ?Neurological:  ?   General: No focal deficit present.  ?   Mental Status: He is alert and oriented to person, place, and time.  ?   GCS: GCS eye subscore is 4. GCS verbal subscore is 5. GCS motor subscore is 6.  ?   Cranial Nerves: Cranial nerves 2-12 are intact.  ?   Sensory: Sensation is intact.  ?   Motor: Motor function is intact.  ?   Coordination: Coordination is intact.  ?   Gait: Gait is intact.  ?Psychiatric:     ?   Mood and Affect: Mood normal.  ? ? ?ED Results / Procedures / Treatments   ?Labs ?(all labs ordered are listed, but only abnormal results are displayed) ?Labs Reviewed - No data to display ? ?EKG ?None ? ?Radiology ?No results found. ? ?Procedures ?Procedures  ? ? ?Medications Ordered in ED ?Medications - No data to display ? ?ED Course/ Medical Decision Making/ A&P ?  ?                        ?Medical Decision  Making ?Amount and/or Complexity of Data Reviewed ?Radiology: ordered. ? ?Risk ?Prescription drug management. ? ? ?11:34 AM ?76 yo male presenting for head and neck injury. Pt is Aox3, no acute distress, afebrile, with stable vitals. Physical exam demonstrates no neurovascular deficits. Abrasion and hematoma to scalp. CT head and neck demonstrates acute process. Pt recommended for rest and f/o  with his established orthospine doctor if neck pain worsens in any way. ? ? ?Patient in no distress and overall condition improved here in the ED. Detailed discussions were had with the patient regarding current findings, and need for close f/u with PCP or on call doctor. The patient has been instructed to return immediately if the symptoms worsen in any way for re-evaluation. Patient verbalized understanding and is in agreement with current care plan. All questions answered prior to discharge. ? ? ? ? ? ? ? ? ?Final Clinical Impression(s) / ED Diagnoses ?Final diagnoses:  ?Neck pain  ?Blunt head trauma, initial encounter  ? ? ?Rx / DC Orders ?ED Discharge Orders   ? ? None  ? ?  ? ? ?  ?Lianne Cure, DO ?47/09/62 0757 ? ?

## 2021-08-20 NOTE — ED Triage Notes (Signed)
Pt was in woods, a large tree limb hit on top of head . Happened on Thursday. Pt LOC for unknown amount of time. When he did make his way home, he was confused/dazed didn't remember what happened for about 20- 45 minutes. Base of skull hurts.  ?

## 2021-08-22 ENCOUNTER — Ambulatory Visit (HOSPITAL_COMMUNITY)
Admission: RE | Admit: 2021-08-22 | Discharge: 2021-08-22 | Disposition: A | Discharge status: Home / Self Care | Payer: Medicare Other | Source: Ambulatory Visit | Attending: Rheumatology | Admitting: Rheumatology

## 2021-08-22 ENCOUNTER — Other Ambulatory Visit: Payer: Self-pay | Admitting: Pharmacist

## 2021-08-22 DIAGNOSIS — M0579 Rheumatoid arthritis with rheumatoid factor of multiple sites without organ or systems involvement: Secondary | ICD-10-CM | POA: Insufficient documentation

## 2021-08-22 DIAGNOSIS — Z79899 Other long term (current) drug therapy: Secondary | ICD-10-CM

## 2021-08-22 MED ORDER — SODIUM CHLORIDE 0.9 % IV SOLN
750.0000 mg | INTRAVENOUS | Status: DC
  Administered 2021-08-22: 750 mg via INTRAVENOUS
  Filled 2021-08-22: qty 30

## 2021-08-22 MED ORDER — ACETAMINOPHEN 325 MG PO TABS: 650.0000 mg | ORAL_TABLET | ORAL | Status: DC

## 2021-08-22 MED ORDER — DIPHENHYDRAMINE HCL 25 MG PO CAPS: 25.0000 mg | ORAL_CAPSULE | ORAL | Status: DC

## 2021-08-22 NOTE — Progress Notes (Signed)
Next infusion scheduled for Orencia IV on 09/19/21 and due for updated orders. ?Diagnosis: RA ? ?Dose: '750mg'$  every 4 weeks (appropriate based on last recorded weight of 95.3 kg) ? ?Last Clinic Visit: 09/08/21 ?Next Clinic Visit: 09/08/21 ? ?Last infusion: 08/22/21 ? ?Labs: 07/25/21 - glucose elevated, rest of CMP wnl, CBC wnl ?TB Gold: negative on 07/25/21  ? ?Orders placed for Orencia IV x 3 doses along with premedication of acetaminophen and diphenhydramine to be administered 30 minutes before medication infusion. ? ?Standing CBC with diff/platelet and CMP with GFR orders placed to be drawn every 2 months.  Next TB gold due 07/26/22 ? ?Knox Saliva, PharmD, MPH, BCPS ?Clinical Pharmacist (Rheumatology and Pulmonology) ?

## 2021-08-25 NOTE — Progress Notes (Signed)
? ?Office Visit Note ? ?Patient: Joel Telfair Meadors Sr.             ?Date of Birth: [birth year]           ?MRN: [id number]             ?PCP: Lindell Spar, MD ?Referring: Lindell Spar, MD ?Visit Date: 09/08/2021 ?Occupation: '@GUAROCC'$ @ ? ?Subjective:  ?Medication management ? ?History of Present Illness: Joel Muska Sergent Sr. is a 76 y.o. male with history of rheumatoid arthritis, osteoarthritis and degenerative disc disease.  He states he has been tolerating Orencia infusions well.  He still continues to have some stiffness in his hands.  He states his right knee will need total replacement.  He has been followed by Dr. Ninfa Linden.  Left total knee replacement bothers him off-and-on.  He continues to have some discomfort in his and neck.  He states a tree branch fell on him in the first week of April.  He was seen in the emergency room where he had CT scan of his head and also cervical spine.  CT scan of the cervical spine showed degenerative changes but no trauma or subluxation. ? ?Activities of Daily Living:  ?Patient reports morning stiffness for 1 hour.   ?Patient Reports nocturnal pain.  ?Difficulty dressing/grooming: Denies ?Difficulty climbing stairs: Reports ?Difficulty getting out of chair: Reports ?Difficulty using hands for taps, buttons, cutlery, and/or writing: Denies ? ?Review of Systems  ?Constitutional:  Negative for fatigue.  ?HENT:  Negative for mouth dryness.   ?Eyes:  Negative for dryness.  ?Respiratory:  Negative for shortness of breath.   ?Cardiovascular:  Negative for swelling in legs/feet.  ?Gastrointestinal:  Negative for constipation.  ?Endocrine: Negative for increased urination.  ?Genitourinary:  Negative for difficulty urinating.  ?Musculoskeletal:  Positive for joint pain, joint pain, muscle weakness, morning stiffness and muscle tenderness. Negative for joint swelling.  ?Skin:  Negative for color change, rash and sensitivity to sunlight.  ?Allergic/Immunologic: Negative for susceptible to  infections.  ?Neurological:  Positive for weakness.  ?Hematological:  Negative for bruising/bleeding tendency and swollen glands.  ?Psychiatric/Behavioral:  Positive for sleep disturbance.   ? ?PMFS History:  ?Patient Active Problem List  ? Diagnosis Date Noted  ? Gastroesophageal reflux disease 05/18/2021  ? Type 2 diabetes mellitus with other specified complication ([place]) 43/15/4008  ? Long term (current) use of insulin ([place]) 05/18/2021  ? Obesity 05/18/2021  ? AKI (acute kidney injury) ([place]) 05/18/2021  ? Elevated transaminase level 05/18/2021  ? Tear of left rotator cuff 09/24/2017  ? DDD (degenerative disc disease), cervical 10/31/2016  ? Primary osteoarthritis of both hands 10/31/2016  ? Primary osteoarthritis of both feet 10/31/2016  ? High risk medication use 07/06/2016  ? Rheumatoid arthritis involving multiple sites with positive rheumatoid factor ([place]) 06/02/2016  ? Contracture, elbow, left 06/02/2016  ? Osteoarthritis of right knee 08/27/2015  ? S/P TKR (total knee replacement), left 08/27/2015  ? Essential hypertension 05/11/2015  ? CAD (coronary artery disease) 04/02/2012  ? Sinus bradycardia   ? Hyperlipidemia   ?  ?Past Medical History:  ?Diagnosis Date  ? Anemia   ? Arthritis   ? "all over"  ? Bradycardia   ? Chronic bronchitis ([place])   ? "get it just about q yr" (03/17/2014)  ? Daily headache   ? "here lately" (03/17/2014); relates to sinuses  ? GERD (gastroesophageal reflux disease)   ? Hard of hearing   ? hearing aides bilat  ? Hiatal hernia   ?  History of blood transfusion 2008  ? "related to OR"  ? History of kidney stones   ? Hyperlipidemia   ? Pneumonia 1972 X 1  ? Rheumatoid arthritis ([place])   ? Type II diabetes mellitus ([place])   ? Wears glasses   ?  ?Family History  ?Problem Relation Age of Onset  ? Cancer Father   ?     prostate  ? ?Past Surgical History:  ?Procedure Laterality Date  ? broken finger     ? surgical repaired left hand 2nd finger   ? cyst removed     ? per left knee/posteriorly  ?  CYSTECTOMY Left 2009  ? "behind knee"  ? INGUINAL HERNIA REPAIR Right ?2010  ? JOINT REPLACEMENT    ? KNEE ARTHROSCOPY Bilateral 1980's  ? PARTIAL KNEE ARTHROPLASTY Right 08/27/2015  ? Procedure: RIGHT UNICOMPARTMENTAL KNEE ARTHROPLASTY;  Surgeon: Mcarthur Rossetti, MD;  Location: [facility];  Service: Orthopedics;  Laterality: Right;  ? REVISION TOTAL KNEE ARTHROPLASTY Left 2008  ? SHOULDER SURGERY Left 2019  ? TOTAL KNEE ARTHROPLASTY Left 2003  ? ?Social History  ? ?Social History Narrative  ? Patient lives at home with family.  ? Caffeine Use: occasionally  ? ?Immunization History  ?Administered Date(s) Administered  ? Pneumococcal Conjugate-13 01/13/2014  ? Pneumococcal Polysaccharide-23 05/15/2006, 09/16/2012, 03/29/2018  ?  ? ?Objective: ?Vital Signs: BP 135/70 (BP Location: Left Arm, Patient Position: Sitting, Cuff Size: Normal)   Pulse 70   Resp 16   Ht '5\' 6"'$  (1.676 m)   Wt 204 lb (92.5 kg)   BMI 32.93 kg/m?   ? ?Physical Exam ?Vitals and nursing note reviewed.  ?Constitutional:   ?   Appearance: He is well-developed.  ?HENT:  ?   Head: Normocephalic and atraumatic.  ?Eyes:  ?   Conjunctiva/sclera: Conjunctivae normal.  ?   Pupils: Pupils are equal, round, and reactive to light.  ?Cardiovascular:  ?   Rate and Rhythm: Normal rate and regular rhythm.  ?   Heart sounds: Normal heart sounds.  ?Pulmonary:  ?   Effort: Pulmonary effort is normal.  ?   Breath sounds: Normal breath sounds.  ?Abdominal:  ?   General: Bowel sounds are normal.  ?   Palpations: Abdomen is soft.  ?Musculoskeletal:  ?   Cervical back: Normal range of motion and neck supple.  ?Skin: ?   General: Skin is warm and dry.  ?   Capillary Refill: Capillary refill takes less than 2 seconds.  ?Neurological:  ?   Mental Status: He is alert and oriented to person, place, and time.  ?Psychiatric:     ?   Behavior: Behavior normal.  ?  ? ?Musculoskeletal Exam: He had limited lateral rotation of the cervical spine.  He discomfort range of motion  of his lumbar spine.  Shoulder joints, elbow joints, wrist joints with good range of motion.  There was no synovitis of wrist joints or MCP joints.  Bilateral PIP and DIP thickening was noted.  Hip joints with range of motion.  Bilateral knee joints were replaced without any warmth swelling or effusion.  There was no tenderness over ankles or MTPs. ? ?CDAI Exam: ?CDAI Score: 0.2  ?Patient Global: 1 mm; Provider Global: 1 mm ?Swollen: 0 ; Tender: 0  ?Joint Exam 09/08/2021  ? ?No joint exam has been documented for this visit  ? ?There is currently no information documented on the homunculus. Go to the Rheumatology activity and complete the homunculus joint exam. ? ?  Investigation: ?No additional findings. ? ?Imaging: ?CT Head Wo Contrast ? ?Result Date: 08/20/2021 ?CLINICAL DATA:  Struck in the head with a large tree limb. Confusion. Loss of consciousness. EXAM: CT HEAD WITHOUT CONTRAST CT CERVICAL SPINE WITHOUT CONTRAST TECHNIQUE: Multidetector CT imaging of the head and cervical spine was performed following the standard protocol without intravenous contrast. Multiplanar CT image reconstructions of the cervical spine were also generated. RADIATION DOSE REDUCTION: This exam was performed according to the departmental dose-optimization program which includes automated exposure control, adjustment of the mA and/or kV according to patient size and/or use of iterative reconstruction technique. COMPARISON:  None. FINDINGS: CT HEAD FINDINGS Brain: There is no evidence for acute hemorrhage, hydrocephalus, mass lesion, or abnormal extra-axial fluid collection. No definite CT evidence for acute infarction. Vascular: No hyperdense vessel or unexpected calcification. Skull: No evidence for fracture. No worrisome lytic or sclerotic lesion. Sinuses/Orbits: The visualized paranasal sinuses and mastoid air cells are clear. Visualized portions of the globes and intraorbital fat are unremarkable. Other: None. CT CERVICAL SPINE FINDINGS  Alignment: Straightening of normal cervical lordosis. Trace anterolisthesis of C7 on T1 is compatible with the advanced facet osteoarthritis at the same level. Skull base and vertebrae: No acute fracture. No prima

## 2021-09-08 ENCOUNTER — Ambulatory Visit (INDEPENDENT_AMBULATORY_CARE_PROVIDER_SITE_OTHER): Payer: Medicare Other | Admitting: Rheumatology

## 2021-09-08 ENCOUNTER — Encounter: Payer: Self-pay | Admitting: Rheumatology

## 2021-09-08 VITALS — BP 135/70 | HR 70 | Resp 16 | Ht 66.0 in | Wt 204.0 lb

## 2021-09-08 DIAGNOSIS — M0579 Rheumatoid arthritis with rheumatoid factor of multiple sites without organ or systems involvement: Secondary | ICD-10-CM

## 2021-09-08 DIAGNOSIS — Z96652 Presence of left artificial knee joint: Secondary | ICD-10-CM

## 2021-09-08 DIAGNOSIS — M5136 Other intervertebral disc degeneration, lumbar region: Secondary | ICD-10-CM

## 2021-09-08 DIAGNOSIS — E876 Hypokalemia: Secondary | ICD-10-CM

## 2021-09-08 DIAGNOSIS — Z79899 Other long term (current) drug therapy: Secondary | ICD-10-CM | POA: Diagnosis not present

## 2021-09-08 DIAGNOSIS — M19071 Primary osteoarthritis, right ankle and foot: Secondary | ICD-10-CM | POA: Diagnosis not present

## 2021-09-08 DIAGNOSIS — R5383 Other fatigue: Secondary | ICD-10-CM | POA: Diagnosis not present

## 2021-09-08 DIAGNOSIS — F5104 Psychophysiologic insomnia: Secondary | ICD-10-CM

## 2021-09-08 DIAGNOSIS — R7989 Other specified abnormal findings of blood chemistry: Secondary | ICD-10-CM

## 2021-09-08 DIAGNOSIS — M19042 Primary osteoarthritis, left hand: Secondary | ICD-10-CM

## 2021-09-08 DIAGNOSIS — M5030 Other cervical disc degeneration, unspecified cervical region: Secondary | ICD-10-CM

## 2021-09-08 DIAGNOSIS — Z8639 Personal history of other endocrine, nutritional and metabolic disease: Secondary | ICD-10-CM

## 2021-09-08 DIAGNOSIS — Z8679 Personal history of other diseases of the circulatory system: Secondary | ICD-10-CM | POA: Diagnosis not present

## 2021-09-08 DIAGNOSIS — Z96651 Presence of right artificial knee joint: Secondary | ICD-10-CM | POA: Diagnosis not present

## 2021-09-08 DIAGNOSIS — M51369 Other intervertebral disc degeneration, lumbar region without mention of lumbar back pain or lower extremity pain: Secondary | ICD-10-CM

## 2021-09-08 DIAGNOSIS — M19041 Primary osteoarthritis, right hand: Secondary | ICD-10-CM | POA: Diagnosis not present

## 2021-09-08 DIAGNOSIS — M19072 Primary osteoarthritis, left ankle and foot: Secondary | ICD-10-CM

## 2021-09-08 NOTE — Patient Instructions (Signed)
Vaccines ?You are taking a medication(s) that can suppress your immune system.  The following immunizations are recommended: ?Flu annually ?Covid-19  ?Td/Tdap (tetanus, diphtheria, pertussis) every 10 years ?Pneumonia (Prevnar 15 then Pneumovax 23 at least 1 year apart.  Alternatively, can take Prevnar 20 without needing additional dose) ?Shingrix: 2 doses from 4 weeks to 6 months apart ? ?Please check with your PCP to make sure you are up to date.  ? ?If you have signs or symptoms of an infection or start antibiotics: ?First, call your PCP for workup of your infection. ?Hold your medication through the infection, until you complete your antibiotics, and until symptoms resolve if you take the following: ?Injectable medication (Actemra, Benlysta, Cimzia, Cosentyx, Enbrel, Humira, Kevzara, Orencia, Remicade, Simponi, [place], [place], [place]) ?Methotrexate ?Leflunomide Jolee Ewing) ?Mycophenolate (Cellcept) ?Roma Kayser, or Rinvoq  ?Heart Disease Prevention  ? ?Your inflammatory disease increases your risk of heart disease which includes heart attack, stroke, atrial fibrillation (irregular heartbeats), high blood pressure, heart failure and atherosclerosis (plaque in the arteries).  It is important to reduce your risk by:  ? ?Keep blood pressure, cholesterol, and blood sugar at healthy levels  ? ?Smoking Cessation  ? ?Maintain a healthy weight  ?BMI 20-25  ? ?Eat a healthy diet  ?Plenty of fresh fruit, vegetables, and whole grains  ?Limit saturated fats, foods high in sodium, and added sugars  ?DASH and Mediterranean diet  ? ?Increase physical activity  ?Recommend moderate physically activity for 150 minutes per week/ 30 minutes a day for five days a week These can be broken up into three separate ten-minute sessions during the day.  ? ?Reduce Stress  ?Meditation, slow breathing exercises, yoga, coloring books  ?Dental visits twice a year   ?

## 2021-09-19 ENCOUNTER — Ambulatory Visit (HOSPITAL_COMMUNITY)
Admission: RE | Admit: 2021-09-19 | Discharge: 2021-09-19 | Disposition: A | Discharge status: Home / Self Care | Payer: Medicare Other | Source: Ambulatory Visit | Attending: Rheumatology | Admitting: Rheumatology

## 2021-09-19 DIAGNOSIS — M0579 Rheumatoid arthritis with rheumatoid factor of multiple sites without organ or systems involvement: Secondary | ICD-10-CM | POA: Insufficient documentation

## 2021-09-19 DIAGNOSIS — Z79899 Other long term (current) drug therapy: Secondary | ICD-10-CM | POA: Insufficient documentation

## 2021-09-19 LAB — COMPREHENSIVE METABOLIC PANEL
ALT: 26 U/L (ref 0–44)
AST: 29 U/L (ref 15–41)
Albumin: 3.8 g/dL (ref 3.5–5.0)
Alkaline Phosphatase: 90 U/L (ref 38–126)
Anion gap: 10 (ref 5–15)
BUN: 23 mg/dL (ref 8–23)
CO2: 25 mmol/L (ref 22–32)
Calcium: 9.1 mg/dL (ref 8.9–10.3)
Chloride: 103 mmol/L (ref 98–111)
Creatinine, Ser: 1.3 mg/dL — ABNORMAL HIGH (ref 0.61–1.24)
GFR, Estimated: 58 mL/min — ABNORMAL LOW (ref >60)
Glucose, Bld: 223 mg/dL — ABNORMAL HIGH (ref 70–99)
Potassium: 3.9 mmol/L (ref 3.5–5.1)
Sodium: 138 mmol/L (ref 135–145)
Total Bilirubin: 1 mg/dL (ref 0.3–1.2)
Total Protein: 6.3 g/dL — ABNORMAL LOW (ref 6.5–8.1)

## 2021-09-19 LAB — CBC WITH DIFFERENTIAL/PLATELET
Abs Immature Granulocytes: 0.02 10*3/uL (ref 0.00–0.07)
Basophils Absolute: 0.1 10*3/uL (ref 0.0–0.1)
Basophils Relative: 1 %
Eosinophils Absolute: 0.2 10*3/uL (ref 0.0–0.5)
Eosinophils Relative: 3 %
HCT: 43.7 % (ref 39.0–52.0)
Hemoglobin: 14.5 g/dL (ref 13.0–17.0)
Immature Granulocytes: 0 %
Lymphocytes Relative: 30 %
Lymphs Abs: 2.2 10*3/uL (ref 0.7–4.0)
MCH: 29.8 pg (ref 26.0–34.0)
MCHC: 33.2 g/dL (ref 30.0–36.0)
MCV: 89.9 fL (ref 80.0–100.0)
Monocytes Absolute: 0.7 10*3/uL (ref 0.1–1.0)
Monocytes Relative: 10 %
Neutro Abs: 4.1 10*3/uL (ref 1.7–7.7)
Neutrophils Relative %: 56 %
Platelets: 251 10*3/uL (ref 150–400)
RBC: 4.86 MIL/uL (ref 4.22–5.81)
RDW: 12.9 % (ref 11.5–15.5)
WBC: 7.4 10*3/uL (ref 4.0–10.5)
nRBC: 0 % (ref 0.0–0.2)

## 2021-09-19 MED ORDER — ACETAMINOPHEN 325 MG PO TABS: 650.0000 mg | ORAL_TABLET | ORAL | Status: DC

## 2021-09-19 MED ORDER — SODIUM CHLORIDE 0.9 % IV SOLN
750.0000 mg | INTRAVENOUS | Status: DC
  Administered 2021-09-19: 750 mg via INTRAVENOUS
  Filled 2021-09-19: qty 30

## 2021-09-19 MED ORDER — DIPHENHYDRAMINE HCL 25 MG PO CAPS: 25.0000 mg | ORAL_CAPSULE | ORAL | Status: DC

## 2021-09-19 NOTE — Progress Notes (Signed)
Glucose is elevated-223.  Creatinine is elevated-1.30 and GFR is borderline low-58.  Please clarify if he has been taking any NSAIDs?  Advil remains on his medication list.  ?Total protein is borderline low but stable.  ?CBC WNL.

## 2021-09-23 ENCOUNTER — Ambulatory Visit: Payer: Medicare Other | Admitting: Internal Medicine

## 2021-10-06 ENCOUNTER — Encounter: Payer: Self-pay | Admitting: Internal Medicine

## 2021-10-06 ENCOUNTER — Encounter: Payer: Self-pay | Admitting: *Deleted

## 2021-10-06 ENCOUNTER — Ambulatory Visit (INDEPENDENT_AMBULATORY_CARE_PROVIDER_SITE_OTHER): Payer: Medicare Other | Admitting: Internal Medicine

## 2021-10-06 VITALS — BP 124/84 | HR 81 | Resp 18 | Ht 66.0 in | Wt 208.4 lb

## 2021-10-06 DIAGNOSIS — I10 Essential (primary) hypertension: Secondary | ICD-10-CM | POA: Diagnosis not present

## 2021-10-06 DIAGNOSIS — L03312 Cellulitis of back [any part except buttock]: Secondary | ICD-10-CM

## 2021-10-06 DIAGNOSIS — E1169 Type 2 diabetes mellitus with other specified complication: Secondary | ICD-10-CM

## 2021-10-06 DIAGNOSIS — K219 Gastro-esophageal reflux disease without esophagitis: Secondary | ICD-10-CM | POA: Diagnosis not present

## 2021-10-06 DIAGNOSIS — F5101 Primary insomnia: Secondary | ICD-10-CM | POA: Insufficient documentation

## 2021-10-06 DIAGNOSIS — E782 Mixed hyperlipidemia: Secondary | ICD-10-CM

## 2021-10-06 DIAGNOSIS — E6609 Other obesity due to excess calories: Secondary | ICD-10-CM

## 2021-10-06 DIAGNOSIS — Z794 Long term (current) use of insulin: Secondary | ICD-10-CM | POA: Diagnosis not present

## 2021-10-06 MED ORDER — HYDROXYZINE PAMOATE 25 MG PO CAPS: 25.0000 mg | ORAL_CAPSULE | Freq: Every evening | ORAL | 5 refills | Status: DC | PRN

## 2021-10-06 MED ORDER — RAMIPRIL 5 MG PO CAPS: 5.0000 mg | ORAL_CAPSULE | Freq: Every day | ORAL | 3 refills | Status: DC

## 2021-10-06 MED ORDER — DOXYCYCLINE HYCLATE 100 MG PO TABS: 100.0000 mg | ORAL_TABLET | Freq: Two times a day (BID) | ORAL | 0 refills | Status: DC

## 2021-10-06 MED ORDER — AMLODIPINE BESYLATE 5 MG PO TABS: 5.0000 mg | ORAL_TABLET | Freq: Every day | ORAL | 3 refills | Status: DC

## 2021-10-06 NOTE — Assessment & Plan Note (Signed)
Lab Results  Component Value Date   HGBA1C 7.2 08/02/2021    Has insulin pump, followed by Dr. Buddy Duty with [facility] Advised to follow diabetic diet On statin and ACEi F/u CMP and lipid profile Diabetic eye exam: Advised to follow up with Ophthalmology for diabetic eye exam

## 2021-10-06 NOTE — Assessment & Plan Note (Signed)
BP Readings from Last 1 Encounters:  10/06/21 124/84   Well-controlled with amlodipine and Ramipril Counseled for compliance with the medications Advised DASH diet and moderate exercise/walking as tolerated

## 2021-10-06 NOTE — Progress Notes (Signed)
Established Patient Office Visit  Subjective:  Patient ID: Joel Lubitz Vessey Sr., male    DOB: 02/11/1946  Age: 76 y.o. MRN: [id number]  CC:  Chief Complaint  Patient presents with   Follow-up    4 month follow up DM and HTN pt has tick bite on the back just itching     HPI Joel Ellender Tester Sr. is a 76 y.o. male with past medical history of HTN, type II DM with HLD, RA, OA and obesity who presents for f/u of his chronic medical conditions.  HTN: BP is well-controlled. Takes medications regularly. Patient denies headache, dizziness, chest pain, dyspnea or palpitations.   Type II DM with HLD: Followed by Dr. Buddy Duty at [facility].  He has an insulin pump. His last HbA1C was 7.2 in 07/2021.  He denies any fatigue, polyuria or polydipsia currently.  He is on statin for HLD.  He complains of a tick bite about 2 days ago, and has a itching rash over his upper back.  He denies any fever, chills, malaise currently.  He complains of insomnia.  He goes to bed at 11 PM and gets up around 3 AM and is unable to fall asleep after it.  He denies any spells of anxiety, anhedonia, SI or HI currently.  Past Medical History:  Diagnosis Date   AKI (acute kidney injury) ([place]) 05/18/2021   Anemia    Arthritis    "all over"   Bradycardia    Chronic bronchitis ([place])    "get it just about q yr" (03/17/2014)   Daily headache    "here lately" (03/17/2014); relates to sinuses   GERD (gastroesophageal reflux disease)    Hard of hearing    hearing aides bilat   Hiatal hernia    History of blood transfusion 2008   "related to OR"   History of kidney stones    Hyperlipidemia    Pneumonia 1972 X 1   Rheumatoid arthritis ([place])    Type II diabetes mellitus ([place])    Wears glasses     Past Surgical History:  Procedure Laterality Date   broken finger      surgical repaired left hand 2nd finger    cyst removed      per left knee/posteriorly   CYSTECTOMY Left 2009   "behind knee"   INGUINAL HERNIA REPAIR Right  ?2010   JOINT REPLACEMENT     KNEE ARTHROSCOPY Bilateral 1980's   PARTIAL KNEE ARTHROPLASTY Right 08/27/2015   Procedure: RIGHT UNICOMPARTMENTAL KNEE ARTHROPLASTY;  Surgeon: Mcarthur Rossetti, MD;  Location: [facility];  Service: Orthopedics;  Laterality: Right;   REVISION TOTAL KNEE ARTHROPLASTY Left 2008   SHOULDER SURGERY Left 2019   TOTAL KNEE ARTHROPLASTY Left 2003    Family History  Problem Relation Age of Onset   Cancer Father        prostate    Social History   Socioeconomic History   Marital status: Married    Spouse name: Abe People   Number of children: 3   Years of education: College   Highest education level: Not on file  Occupational History    Employer: UNEMPLOYED  Tobacco Use   Smoking status: Former    Packs/day: 1.00    Years: 5.00    Pack years: 5.00    Types: Cigarettes, Cigars    Quit date: 05/15/1968    Years since quitting: 53.4   Smokeless tobacco: Never  Vaping Use   Vaping Use: Never used  Substance  and Sexual Activity   Alcohol use: No    Alcohol/week: 0.0 standard drinks   Drug use: Never   Sexual activity: Yes  Other Topics Concern   Not on file  Social History Narrative   Patient lives at home with family.   Caffeine Use: occasionally   Social Determinants of Health   Financial Resource Strain: Not on file  Food Insecurity: Not on file  Transportation Needs: Not on file  Physical Activity: Not on file  Stress: Not on file  Social Connections: Not on file  Intimate Partner Violence: Not on file    Outpatient Medications Prior to Visit  Medication Sig Dispense Refill   Abatacept (ORENCIA IV) Inject 750 mg into the vein every 28 (twenty-eight) days.     Ascorbic Acid (VITAMIN C PO) Take by mouth daily.     aspirin EC 81 MG tablet Take 81 mg by mouth daily.     Calcium Carbonate-Vit D-Min (CALCIUM 1200 PO) Take by mouth daily.     Cholecalciferol (VITAMIN D3 PO) Take by mouth daily.     HUMALOG 100 UNIT/ML injection INJ UP TO 65 UNI  [place]lie VIA INSULIN PUMP QD     HYDROcodone-acetaminophen (NORCO) 5-325 MG tablet Take 1 tablet by mouth every 6 (six) hours as needed for moderate pain. 12 tablet 0   insulin aspart (NOVOLOG) 100 UNIT/ML FlexPen Inject 1 Units into the skin 3 (three) times daily with meals. 1 unit per hour     KRILL OIL 1000 MG CAPS Take 1,000 mg by mouth daily.     Lancets (ONETOUCH ULTRASOFT) lancets FOR USE WHEN CHECKING BLOOD SUGAR QID.  3   loratadine (CLARITIN) 10 MG tablet Take 10 mg by mouth daily.     Misc Natural Products (TART CHERRY ADVANCED PO) Take by mouth daily.     Multiple Vitamin (MULTIVITAMIN WITH MINERALS) TABS Take 1 tablet by mouth daily.     Multiple Vitamins-Minerals (ZINC PO) Take by mouth daily.     ONE TOUCH ULTRA TEST test strip USE WHEN CHECKING BLOOD SUGAR QID.  3   pantoprazole (PROTONIX) 40 MG tablet TAKE 1 TABLET DAILY. NEED OFFICE VISIT AND LABS BEFORE FURTHER REFILLS 90 tablet 0   potassium chloride (KLOR-CON) 10 MEQ tablet Take by mouth.     POTASSIUM PO Take by mouth daily.     pravastatin (PRAVACHOL) 40 MG tablet Take 1 tablet (40 mg total) by mouth every evening. 90 tablet 3   Pyridoxine HCl (VITAMIN B-6 PO) Take by mouth daily.     tizanidine (ZANAFLEX) 2 MG capsule Take 1 capsule (2 mg total) by mouth 3 (three) times daily. 40 capsule 1   vitamin E 400 UNIT capsule Take 400 Units by mouth daily.     amLODipine (NORVASC) 5 MG tablet Take 1 tablet (5 mg total) by mouth daily. 90 tablet 3   ibuprofen (ADVIL) 600 MG tablet Take 1 tablet (600 mg total) by mouth every 6 (six) hours as needed. 30 tablet 0   ramipril (ALTACE) 5 MG capsule TAKE 1 CAPSULE EVERY DAY 90 capsule 3   No facility-administered medications prior to visit.    Allergies  Allergen Reactions   Codeine Swelling   Lipitor [Atorvastatin] Other (See Comments)    Muscle aches    Invokana [Canagliflozin] Other (See Comments)    Stomach upset    Losartan Nausea And Vomiting   Roxicodone [Oxycodone] Itching    Vicodin [Hydrocodone-Acetaminophen] Itching    ROS Review of Systems  Constitutional:  Negative for chills and fever.  HENT:  Negative for congestion and sore throat.   Eyes:  Negative for pain and discharge.  Respiratory:  Negative for cough and shortness of breath.   Cardiovascular:  Negative for chest pain and palpitations.  Gastrointestinal:  Negative for diarrhea, nausea and vomiting.  Endocrine: Negative for polydipsia and polyuria.  Genitourinary:  Negative for dysuria and hematuria.  Musculoskeletal:  Positive for arthralgias and back pain. Negative for neck pain and neck stiffness.  Skin:  Positive for rash.  Neurological:  Negative for dizziness, weakness, numbness and headaches.  Psychiatric/Behavioral:  Positive for sleep disturbance. Negative for agitation and behavioral problems.      Objective:    Physical Exam Vitals reviewed.  Constitutional:      General: He is not in acute distress.    Appearance: He is obese. He is not diaphoretic.  HENT:     Head: Normocephalic and atraumatic.     Nose: Nose normal.     Mouth/Throat:     Mouth: Mucous membranes are moist.  Eyes:     General: No scleral icterus.    Extraocular Movements: Extraocular movements intact.  Cardiovascular:     Rate and Rhythm: Normal rate and regular rhythm.     Pulses: Normal pulses.     Heart sounds: Normal heart sounds. No murmur heard. Pulmonary:     Breath sounds: Normal breath sounds. No wheezing or rales.  Musculoskeletal:     Cervical back: Neck supple. No tenderness.     Right lower leg: No edema.     Left lower leg: No edema.  Skin:    General: Skin is warm.     Findings: Rash (Erythematous papule over upper back) present.     Comments: Cyst over upper back  Neurological:     General: No focal deficit present.     Mental Status: He is alert and oriented to person, place, and time.     Sensory: No sensory deficit.     Motor: No weakness.  Psychiatric:        Mood and  Affect: Mood normal.        Behavior: Behavior normal.    BP 124/84 (BP Location: Right Arm, Patient Position: Sitting, Cuff Size: Normal)   Pulse 81   Resp 18   Ht '5\' 6"'  (1.676 m)   Wt 208 lb 6.4 oz (94.5 kg)   SpO2 97%   BMI 33.64 kg/m  Wt Readings from Last 3 Encounters:  10/06/21 208 lb 6.4 oz (94.5 kg)  09/19/21 203 lb (92.1 kg)  09/08/21 204 lb (92.5 kg)    Lab Results  Component Value Date   TSH 3.03 05/24/2016   Lab Results  Component Value Date   WBC 7.4 09/19/2021   HGB 14.5 09/19/2021   HCT 43.7 09/19/2021   MCV 89.9 09/19/2021   PLT 251 09/19/2021   Lab Results  Component Value Date   NA 138 09/19/2021   K 3.9 09/19/2021   CO2 25 09/19/2021   GLUCOSE 223 (H) 09/19/2021   BUN 23 09/19/2021   CREATININE 1.30 (H) 09/19/2021   BILITOT 1.0 09/19/2021   ALKPHOS 90 09/19/2021   AST 29 09/19/2021   ALT 26 09/19/2021   PROT 6.3 (L) 09/19/2021   ALBUMIN 3.8 09/19/2021   CALCIUM 9.1 09/19/2021   ANIONGAP 10 09/19/2021   EGFR 67 05/23/2021   Lab Results  Component Value Date   CHOL 120 05/20/2019   Lab Results  Component Value Date   HDL 41 05/20/2019   Lab Results  Component Value Date   LDLCALC 69 05/20/2019   Lab Results  Component Value Date   TRIG 41 05/20/2019   Lab Results  Component Value Date   CHOLHDL 2.9 05/20/2019   Lab Results  Component Value Date   HGBA1C 7.2 08/02/2021      Assessment & Plan:   Problem List Items Addressed This Visit       Cardiovascular and Mediastinum   Essential hypertension    BP Readings from Last 1 Encounters:  10/06/21 124/84  Well-controlled with amlodipine and Ramipril Counseled for compliance with the medications Advised DASH diet and moderate exercise/walking as tolerated      Relevant Medications   amLODipine (NORVASC) 5 MG tablet   ramipril (ALTACE) 5 MG capsule     Digestive   Gastroesophageal reflux disease    Well controlled with pantoprazole         Endocrine   Type 2  diabetes mellitus with other specified complication ([place]) - Primary    Lab Results  Component Value Date   HGBA1C 7.2 08/02/2021   Has insulin pump, followed by Dr. Buddy Duty with [facility] Advised to follow diabetic diet On statin and ACEi F/u CMP and lipid profile Diabetic eye exam: Advised to follow up with Ophthalmology for diabetic eye exam      Relevant Medications   ramipril (ALTACE) 5 MG capsule   Other Relevant Orders   Microalbumin / creatinine urine ratio     Other   Hyperlipidemia    On pravastatin       Relevant Medications   amLODipine (NORVASC) 5 MG tablet   ramipril (ALTACE) 5 MG capsule   Obesity    Diet modification and moderate exercise advised       Primary insomnia    Sleep hygiene discussed, material provided Started Vistaril as needed       Relevant Medications   hydrOXYzine (VISTARIL) 25 MG capsule   Other Visit Diagnoses     Cellulitis of back except buttock    Had tick bite recently, rash does not appear to be erythema migrans Likely cellulitis as has erythema and slight swelling Empiric Doxycycline   Relevant Medications   doxycycline (VIBRA-TABS) 100 MG tablet       Meds ordered this encounter  Medications   doxycycline (VIBRA-TABS) 100 MG tablet    Sig: Take 1 tablet (100 mg total) by mouth 2 (two) times daily.    Dispense:  14 tablet    Refill:  0   hydrOXYzine (VISTARIL) 25 MG capsule    Sig: Take 1 capsule (25 mg total) by mouth at bedtime as needed (Insomnia).    Dispense:  30 capsule    Refill:  5   amLODipine (NORVASC) 5 MG tablet    Sig: Take 1 tablet (5 mg total) by mouth daily.    Dispense:  90 tablet    Refill:  3   ramipril (ALTACE) 5 MG capsule    Sig: Take 1 capsule (5 mg total) by mouth daily.    Dispense:  90 capsule    Refill:  3    Follow-up: Return in about 6 months (around 04/08/2022) for HTN, HLD and DM.    Lindell Spar, MD

## 2021-10-06 NOTE — Patient Instructions (Signed)
Please take Doxycycline for cellulitis/tick bite.  Please take Vistaril as needed for insomnia.  Please maintain simple sleep hygiene. - Maintain dark and non-noisy environment in the bedroom. - Please use the bedroom for sleep and sexual activity only. - Do not use electronic devices in the bedroom. - Please take dinner at least 2 hours before bedtime. - Please avoid caffeinated products in the evening, including coffee, soft drinks. - Please try to maintain the regular sleep-wake cycle - Go to bed and wake up at the same time.

## 2021-10-06 NOTE — Assessment & Plan Note (Signed)
Diet modification and moderate exercise advised. 

## 2021-10-06 NOTE — Assessment & Plan Note (Signed)
On pravastatin.   

## 2021-10-06 NOTE — Assessment & Plan Note (Signed)
Sleep hygiene discussed, material provided Started Vistaril as needed

## 2021-10-06 NOTE — Assessment & Plan Note (Signed)
Well controlled with pantoprazole 

## 2021-10-08 LAB — MICROALBUMIN / CREATININE URINE RATIO
Creatinine, Urine: 220.3 mg/dL
Microalb/Creat Ratio: 13 mg/g creat (ref 0–29)
Microalbumin, Urine: 27.8 ug/mL

## 2021-10-12 ENCOUNTER — Encounter: Payer: Self-pay | Admitting: *Deleted

## 2021-10-17 ENCOUNTER — Ambulatory Visit (HOSPITAL_COMMUNITY)
Admission: RE | Admit: 2021-10-17 | Discharge: 2021-10-17 | Disposition: A | Discharge status: Home / Self Care | Payer: Medicare Other | Source: Ambulatory Visit | Attending: Rheumatology | Admitting: Rheumatology

## 2021-10-17 DIAGNOSIS — M0579 Rheumatoid arthritis with rheumatoid factor of multiple sites without organ or systems involvement: Secondary | ICD-10-CM | POA: Diagnosis not present

## 2021-10-17 MED ORDER — SODIUM CHLORIDE 0.9 % IV SOLN
750.0000 mg | INTRAVENOUS | Status: DC
  Administered 2021-10-17: 750 mg via INTRAVENOUS
  Filled 2021-10-17: qty 30

## 2021-10-17 MED ORDER — DIPHENHYDRAMINE HCL 25 MG PO CAPS: 25.0000 mg | ORAL_CAPSULE | ORAL | Status: DC

## 2021-10-17 MED ORDER — ACETAMINOPHEN 325 MG PO TABS: 650.0000 mg | ORAL_TABLET | ORAL | Status: DC

## 2021-11-14 ENCOUNTER — Ambulatory Visit (HOSPITAL_COMMUNITY)
Admission: RE | Admit: 2021-11-14 | Discharge: 2021-11-14 | Disposition: A | Discharge status: Home / Self Care | Payer: Medicare Other | Source: Ambulatory Visit | Attending: Internal Medicine | Admitting: Internal Medicine

## 2021-11-14 DIAGNOSIS — Z79899 Other long term (current) drug therapy: Secondary | ICD-10-CM | POA: Diagnosis not present

## 2021-11-14 DIAGNOSIS — M0579 Rheumatoid arthritis with rheumatoid factor of multiple sites without organ or systems involvement: Secondary | ICD-10-CM | POA: Diagnosis not present

## 2021-11-14 LAB — CBC WITH DIFFERENTIAL/PLATELET
Abs Immature Granulocytes: 0.02 10*3/uL (ref 0.00–0.07)
Basophils Absolute: 0.1 10*3/uL (ref 0.0–0.1)
Basophils Relative: 1 %
Eosinophils Absolute: 0.2 10*3/uL (ref 0.0–0.5)
Eosinophils Relative: 3 %
HCT: 40 % (ref 39.0–52.0)
Hemoglobin: 13.5 g/dL (ref 13.0–17.0)
Immature Granulocytes: 0 %
Lymphocytes Relative: 28 %
Lymphs Abs: 1.8 10*3/uL (ref 0.7–4.0)
MCH: 30.8 pg (ref 26.0–34.0)
MCHC: 33.8 g/dL (ref 30.0–36.0)
MCV: 91.1 fL (ref 80.0–100.0)
Monocytes Absolute: 0.6 10*3/uL (ref 0.1–1.0)
Monocytes Relative: 10 %
Neutro Abs: 3.7 10*3/uL (ref 1.7–7.7)
Neutrophils Relative %: 58 %
Platelets: 232 10*3/uL (ref 150–400)
RBC: 4.39 MIL/uL (ref 4.22–5.81)
RDW: 13.2 % (ref 11.5–15.5)
WBC: 6.5 10*3/uL (ref 4.0–10.5)
nRBC: 0 % (ref 0.0–0.2)

## 2021-11-14 LAB — COMPREHENSIVE METABOLIC PANEL
ALT: 25 U/L (ref 0–44)
AST: 27 U/L (ref 15–41)
Albumin: 3.6 g/dL (ref 3.5–5.0)
Alkaline Phosphatase: 88 U/L (ref 38–126)
Anion gap: 7 (ref 5–15)
BUN: 16 mg/dL (ref 8–23)
CO2: 24 mmol/L (ref 22–32)
Calcium: 8.6 mg/dL — ABNORMAL LOW (ref 8.9–10.3)
Chloride: 104 mmol/L (ref 98–111)
Creatinine, Ser: 1.03 mg/dL (ref 0.61–1.24)
GFR, Estimated: >60 mL/min (ref >60)
Glucose, Bld: 211 mg/dL — ABNORMAL HIGH (ref 70–99)
Potassium: 4.1 mmol/L (ref 3.5–5.1)
Sodium: 135 mmol/L (ref 135–145)
Total Bilirubin: 0.9 mg/dL (ref 0.3–1.2)
Total Protein: 5.9 g/dL — ABNORMAL LOW (ref 6.5–8.1)

## 2021-11-14 MED ORDER — ACETAMINOPHEN 325 MG PO TABS: 650.0000 mg | ORAL_TABLET | ORAL | Status: DC

## 2021-11-14 MED ORDER — SODIUM CHLORIDE 0.9 % IV SOLN
750.0000 mg | INTRAVENOUS | Status: DC
  Administered 2021-11-14: 750 mg via INTRAVENOUS
  Filled 2021-11-14: qty 30

## 2021-11-14 MED ORDER — DIPHENHYDRAMINE HCL 25 MG PO CAPS: 25.0000 mg | ORAL_CAPSULE | ORAL | Status: DC

## 2021-11-17 NOTE — Progress Notes (Signed)
Glucose is elevated.  Calcium is low.  Patient should take calcium with vitamin D.  CBC is normal.

## 2021-11-28 DIAGNOSIS — E1165 Type 2 diabetes mellitus with hyperglycemia: Secondary | ICD-10-CM | POA: Diagnosis not present

## 2021-11-28 DIAGNOSIS — I10 Essential (primary) hypertension: Secondary | ICD-10-CM | POA: Diagnosis not present

## 2021-11-28 DIAGNOSIS — N182 Chronic kidney disease, stage 2 (mild): Secondary | ICD-10-CM | POA: Diagnosis not present

## 2021-11-28 DIAGNOSIS — Z794 Long term (current) use of insulin: Secondary | ICD-10-CM | POA: Diagnosis not present

## 2021-11-28 DIAGNOSIS — E1122 Type 2 diabetes mellitus with diabetic chronic kidney disease: Secondary | ICD-10-CM | POA: Diagnosis not present

## 2021-12-16 ENCOUNTER — Other Ambulatory Visit: Payer: Self-pay | Admitting: Pharmacist

## 2021-12-16 DIAGNOSIS — Z79899 Other long term (current) drug therapy: Secondary | ICD-10-CM

## 2021-12-16 DIAGNOSIS — M0579 Rheumatoid arthritis with rheumatoid factor of multiple sites without organ or systems involvement: Secondary | ICD-10-CM

## 2021-12-16 NOTE — Progress Notes (Signed)
Next infusion scheduled for Orencia IV on 12/19/21 and due for updated orders. Diagnosis:  Dose: '750mg'$  SQ every 4 weeks (appropriate based on last recorded weight of 95.3kg)  Last Clinic Visit: 09/08/21 Next Clinic Visit: 02/08/22  Last infusion: 11/14/21  Labs: CBC and CMP on 11/14/21 *- stable TB Gold: negative on 07/25/21   Orders placed for Orencia IV x 3 doses along with premedication of acetaminophen and diphenhydramine to be administered 30 minutes before medication infusion.  Standing CBC with diff/platelet and CMP with GFR orders placed to be drawn every 2 months.  Next TB gold due 07/26/22  Knox Saliva, PharmD, MPH, BCPS, CPP Clinical Pharmacist (Rheumatology and Pulmonology)

## 2021-12-19 ENCOUNTER — Ambulatory Visit (HOSPITAL_COMMUNITY)
Admission: RE | Admit: 2021-12-19 | Discharge: 2021-12-19 | Disposition: A | Discharge status: Home / Self Care | Payer: Medicare Other | Source: Ambulatory Visit | Attending: Rheumatology | Admitting: Rheumatology

## 2021-12-19 DIAGNOSIS — M0579 Rheumatoid arthritis with rheumatoid factor of multiple sites without organ or systems involvement: Secondary | ICD-10-CM | POA: Diagnosis not present

## 2021-12-19 MED ORDER — DIPHENHYDRAMINE HCL 25 MG PO CAPS: 25.0000 mg | ORAL_CAPSULE | ORAL | Status: DC

## 2021-12-19 MED ORDER — SODIUM CHLORIDE 0.9 % IV SOLN
750.0000 mg | INTRAVENOUS | Status: DC
  Administered 2021-12-19: 750 mg via INTRAVENOUS
  Filled 2021-12-19: qty 30

## 2021-12-19 MED ORDER — ACETAMINOPHEN 325 MG PO TABS: 650.0000 mg | ORAL_TABLET | ORAL | Status: DC

## 2022-01-12 ENCOUNTER — Encounter: Payer: Self-pay | Admitting: Family Medicine

## 2022-01-12 ENCOUNTER — Ambulatory Visit (INDEPENDENT_AMBULATORY_CARE_PROVIDER_SITE_OTHER): Payer: Medicare Other | Admitting: Family Medicine

## 2022-01-12 DIAGNOSIS — R051 Acute cough: Secondary | ICD-10-CM | POA: Diagnosis not present

## 2022-01-12 MED ORDER — BENZONATATE 100 MG PO CAPS: 100.0000 mg | ORAL_CAPSULE | Freq: Two times a day (BID) | ORAL | 0 refills | Status: DC | PRN

## 2022-01-12 NOTE — Progress Notes (Addendum)
Virtual Visit via Telephone Note   This visit type was conducted due to national recommendations for restrictions regarding the COVID-19 Pandemic (e.g. social distancing) in an effort to limit this patient's exposure and mitigate transmission in our community.  Due to his co-morbid illnesses, this patient is at least at moderate risk for complications without adequate follow up.  This format is felt to be most appropriate for this patient at this time.  The patient did not have access to video technology/had technical difficulties with video requiring transitioning to audio format only (telephone).  All issues noted in this document were discussed and addressed.  No physical exam could be performed with this format.  Please refer to the patient's chart for his  consent to telehealth for [facility]   Evaluation Performed:  Follow-up visit  Date:  01/12/2022   ID:  Joel Boston Noyce Sr., DOB 1945-07-15, MRN [id number]  Patient Location: Home Provider Location: Office/Clinic  Participants: Patient Location of Patient: Home Location of Provider: Telehealth Consent was obtain for visit to be over via telehealth. I verified that I am speaking with the correct person using two identifiers.  PCP:  Lindell Spar, MD   Chief Complaint:    History of Present Illness:    Joel Lombardo Mcgurn Sr. is a 76 y.o. male  c/o of a non-productive cough for 5 days. He denies URI symptoms and sinus pain and pressure. The patient is a nonsmoker.  He denies fever, SOB, and chills. No signs of GERD were reported. No hemoptysis was reported. He has tried otc cough syrup to no avail.      The patient does not have symptoms concerning for COVID-19 infection (fever, chills, cough, or new shortness of breath).   Past Medical, Surgical, Social History, Allergies, and Medications have been Reviewed.  Past Medical History:  Diagnosis Date   AKI (acute kidney injury) ([place]) 05/18/2021   Anemia    Arthritis    "all over"    Bradycardia    Chronic bronchitis ([place])    "get it just about q yr" (03/17/2014)   Daily headache    "here lately" (03/17/2014); relates to sinuses   GERD (gastroesophageal reflux disease)    Hard of hearing    hearing aides bilat   Hiatal hernia    History of blood transfusion 2008   "related to OR"   History of kidney stones    Hyperlipidemia    Pneumonia 1972 X 1   Rheumatoid arthritis ([place])    Type II diabetes mellitus ([place])    Wears glasses    Past Surgical History:  Procedure Laterality Date   broken finger      surgical repaired left hand 2nd finger    cyst removed      per left knee/posteriorly   CYSTECTOMY Left 2009   "behind knee"   INGUINAL HERNIA REPAIR Right ?2010   JOINT REPLACEMENT     KNEE ARTHROSCOPY Bilateral 1980's   PARTIAL KNEE ARTHROPLASTY Right 08/27/2015   Procedure: RIGHT UNICOMPARTMENTAL KNEE ARTHROPLASTY;  Surgeon: Mcarthur Rossetti, MD;  Location: [facility];  Service: Orthopedics;  Laterality: Right;   REVISION TOTAL KNEE ARTHROPLASTY Left 2008   SHOULDER SURGERY Left 2019   TOTAL KNEE ARTHROPLASTY Left 2003     Current Meds  Medication Sig   Abatacept (ORENCIA IV) Inject 750 mg into the vein every 28 (twenty-eight) days.   amLODipine (NORVASC) 5 MG tablet Take 1 tablet (5 mg total) by mouth  daily.   Ascorbic Acid (VITAMIN C PO) Take by mouth daily.   aspirin EC 81 MG tablet Take 81 mg by mouth daily.   benzonatate (TESSALON) 100 MG capsule Take 1 capsule (100 mg total) by mouth 2 (two) times daily as needed for cough.   Calcium Carbonate-Vit D-Min (CALCIUM 1200 PO) Take by mouth daily.   Cholecalciferol (VITAMIN D3 PO) Take by mouth daily.   doxycycline (VIBRA-TABS) 100 MG tablet Take 1 tablet (100 mg total) by mouth 2 (two) times daily.   HUMALOG 100 UNIT/ML injection INJ UP TO 65 UNI [place] VIA INSULIN PUMP QD   HYDROcodone-acetaminophen (NORCO) 5-325 MG tablet Take 1 tablet by mouth every 6 (six) hours as needed for moderate pain.    hydrOXYzine (VISTARIL) 25 MG capsule Take 1 capsule (25 mg total) by mouth at bedtime as needed (Insomnia).   insulin aspart (NOVOLOG) 100 UNIT/ML FlexPen Inject 1 Units into the skin 3 (three) times daily with meals. 1 unit per hour   KRILL OIL 1000 MG CAPS Take 1,000 mg by mouth daily.   Lancets (ONETOUCH ULTRASOFT) lancets FOR USE WHEN CHECKING BLOOD SUGAR QID.   loratadine (CLARITIN) 10 MG tablet Take 10 mg by mouth daily.   Misc Natural Products (TART CHERRY ADVANCED PO) Take by mouth daily.   Multiple Vitamin (MULTIVITAMIN WITH MINERALS) TABS Take 1 tablet by mouth daily.   Multiple Vitamins-Minerals (ZINC PO) Take by mouth daily.   ONE TOUCH ULTRA TEST test strip USE WHEN CHECKING BLOOD SUGAR QID.   pantoprazole (PROTONIX) 40 MG tablet TAKE 1 TABLET DAILY. NEED OFFICE VISIT AND LABS BEFORE FURTHER REFILLS   potassium chloride (KLOR-CON) 10 MEQ tablet Take by mouth.   POTASSIUM PO Take by mouth daily.   pravastatin (PRAVACHOL) 40 MG tablet Take 1 tablet (40 mg total) by mouth every evening.   Pyridoxine HCl (VITAMIN B-6 PO) Take by mouth daily.   ramipril (ALTACE) 5 MG capsule Take 1 capsule (5 mg total) by mouth daily.   tizanidine (ZANAFLEX) 2 MG capsule Take 1 capsule (2 mg total) by mouth 3 (three) times daily.   vitamin E 400 UNIT capsule Take 400 Units by mouth daily.     Allergies:   Codeine, Lipitor [atorvastatin], Invokana [canagliflozin], Losartan, Roxicodone [oxycodone], and Vicodin [hydrocodone-acetaminophen]   ROS:   Please see the history of present illness.     All other systems reviewed and are negative.   Labs/Other Tests and Data Reviewed:    Recent Labs: 11/14/2021: ALT 25; BUN 16; Creatinine, Ser 1.03; Hemoglobin 13.5; Platelets 232; Potassium 4.1; Sodium 135   Recent Lipid Panel Lab Results  Component Value Date/Time   CHOL 120 05/20/2019 11:02 AM   TRIG 41 05/20/2019 11:02 AM   HDL 41 05/20/2019 11:02 AM   CHOLHDL 2.9 05/20/2019 11:02 AM   CHOLHDL 3.6  01/23/2019 08:11 AM   LDLCALC 69 05/20/2019 11:02 AM    Wt Readings from Last 3 Encounters:  11/14/21 210 lb (95.3 kg)  10/17/21 210 lb (95.3 kg)  10/06/21 208 lb 6.4 oz (94.5 kg)     Objective:    Vital Signs:  There were no vitals taken for this visit.     ASSESSMENT & PLAN:   Acute Cough Will start a trial of tessalon for noninfectious and nonsmoker cough  Time:   Today, I have spent 8 minutes reviewing the chart, including problem list, medications, and with the patient with telehealth technology discussing the above problems.   Medication Adjustments/Labs  and Tests Ordered: Current medicines are reviewed at length with the patient today.  Concerns regarding medicines are outlined above.   Tests Ordered: No orders of the defined types were placed in this encounter.   Medication Changes: Meds ordered this encounter  Medications   benzonatate (TESSALON) 100 MG capsule    Sig: Take 1 capsule (100 mg total) by mouth 2 (two) times daily as needed for cough.    Dispense:  20 capsule    Refill:  0     Note: This dictation was prepared with Dragon dictation along with smaller phrase technology. Similar sounding words can be transcribed inadequately or [place] not be corrected upon review. Any transcriptional errors that result from this process are unintentional.      Disposition:  Follow up  Signed, Alvira Monday, FNP  01/12/2022 4:09 PM     [facility]

## 2022-01-13 ENCOUNTER — Telehealth: Payer: Self-pay | Admitting: Family Medicine

## 2022-01-13 NOTE — Telephone Encounter (Signed)
Pt wife called stating that he is not any better today. States the pearls for the cough has not even helped. Wants to know if he can get some stronger cough syrup?     [facility] [address]

## 2022-01-17 ENCOUNTER — Ambulatory Visit (HOSPITAL_COMMUNITY)
Admission: RE | Admit: 2022-01-17 | Discharge: 2022-01-17 | Disposition: A | Discharge status: Home / Self Care | Payer: Medicare Other | Source: Ambulatory Visit | Attending: Rheumatology | Admitting: Rheumatology

## 2022-01-17 DIAGNOSIS — M0579 Rheumatoid arthritis with rheumatoid factor of multiple sites without organ or systems involvement: Secondary | ICD-10-CM | POA: Diagnosis not present

## 2022-01-17 DIAGNOSIS — Z79899 Other long term (current) drug therapy: Secondary | ICD-10-CM | POA: Diagnosis not present

## 2022-01-17 LAB — CBC WITH DIFFERENTIAL/PLATELET
Abs Immature Granulocytes: 0.02 10*3/uL (ref 0.00–0.07)
Basophils Absolute: 0.1 10*3/uL (ref 0.0–0.1)
Basophils Relative: 1 %
Eosinophils Absolute: 0.3 10*3/uL (ref 0.0–0.5)
Eosinophils Relative: 5 %
HCT: 39.2 % (ref 39.0–52.0)
Hemoglobin: 13.8 g/dL (ref 13.0–17.0)
Immature Granulocytes: 0 %
Lymphocytes Relative: 39 %
Lymphs Abs: 2.4 10*3/uL (ref 0.7–4.0)
MCH: 31.6 pg (ref 26.0–34.0)
MCHC: 35.2 g/dL (ref 30.0–36.0)
MCV: 89.7 fL (ref 80.0–100.0)
Monocytes Absolute: 0.7 10*3/uL (ref 0.1–1.0)
Monocytes Relative: 11 %
Neutro Abs: 2.7 10*3/uL (ref 1.7–7.7)
Neutrophils Relative %: 44 %
Platelets: 277 10*3/uL (ref 150–400)
RBC: 4.37 MIL/uL (ref 4.22–5.81)
RDW: 12.9 % (ref 11.5–15.5)
WBC: 6.2 10*3/uL (ref 4.0–10.5)
nRBC: 0 % (ref 0.0–0.2)

## 2022-01-17 LAB — COMPREHENSIVE METABOLIC PANEL
ALT: 25 U/L (ref 0–44)
AST: 29 U/L (ref 15–41)
Albumin: 3.5 g/dL (ref 3.5–5.0)
Alkaline Phosphatase: 95 U/L (ref 38–126)
Anion gap: 8 (ref 5–15)
BUN: 12 mg/dL (ref 8–23)
CO2: 26 mmol/L (ref 22–32)
Calcium: 8.6 mg/dL — ABNORMAL LOW (ref 8.9–10.3)
Chloride: 105 mmol/L (ref 98–111)
Creatinine, Ser: 1.28 mg/dL — ABNORMAL HIGH (ref 0.61–1.24)
GFR, Estimated: 58 mL/min — ABNORMAL LOW (ref >60)
Glucose, Bld: 166 mg/dL — ABNORMAL HIGH (ref 70–99)
Potassium: 3.6 mmol/L (ref 3.5–5.1)
Sodium: 139 mmol/L (ref 135–145)
Total Bilirubin: 0.8 mg/dL (ref 0.3–1.2)
Total Protein: 6.2 g/dL — ABNORMAL LOW (ref 6.5–8.1)

## 2022-01-17 MED ORDER — ACETAMINOPHEN 325 MG PO TABS: 650.0000 mg | ORAL_TABLET | ORAL | Status: DC

## 2022-01-17 MED ORDER — SODIUM CHLORIDE 0.9 % IV SOLN
750.0000 mg | INTRAVENOUS | Status: DC
  Administered 2022-01-17: 750 mg via INTRAVENOUS
  Filled 2022-01-17: qty 30

## 2022-01-17 MED ORDER — DIPHENHYDRAMINE HCL 25 MG PO CAPS: 25.0000 mg | ORAL_CAPSULE | ORAL | Status: DC

## 2022-01-17 NOTE — Progress Notes (Signed)
CBC WNL.   Glucose is elevated 166.  Creatinine elevated at 1.28 and GFR borderline low at 58. Avoid the use of all NSAIDs.   LFTs within normal limits.  Calcium remains low at 8.6.  Please clarify if he has been taking a calcium supplement.

## 2022-01-19 NOTE — Telephone Encounter (Signed)
Forward to provider, please

## 2022-01-20 ENCOUNTER — Other Ambulatory Visit: Payer: Self-pay | Admitting: Internal Medicine

## 2022-01-20 DIAGNOSIS — R051 Acute cough: Secondary | ICD-10-CM

## 2022-01-20 MED ORDER — PROMETHAZINE-DM 6.25-15 MG/5ML PO SYRP: 5.0000 mL | ORAL_SOLUTION | Freq: Four times a day (QID) | ORAL | 0 refills | Status: DC | PRN

## 2022-01-20 NOTE — Telephone Encounter (Signed)
Pt informed

## 2022-01-23 ENCOUNTER — Encounter: Payer: Self-pay | Admitting: Internal Medicine

## 2022-01-23 ENCOUNTER — Ambulatory Visit (INDEPENDENT_AMBULATORY_CARE_PROVIDER_SITE_OTHER): Payer: Medicare Other | Admitting: Internal Medicine

## 2022-01-23 VITALS — BP 118/78 | HR 88 | Resp 18 | Ht 66.0 in | Wt 205.4 lb

## 2022-01-23 DIAGNOSIS — J209 Acute bronchitis, unspecified: Secondary | ICD-10-CM

## 2022-01-23 DIAGNOSIS — E782 Mixed hyperlipidemia: Secondary | ICD-10-CM

## 2022-01-23 DIAGNOSIS — I10 Essential (primary) hypertension: Secondary | ICD-10-CM | POA: Diagnosis not present

## 2022-01-23 DIAGNOSIS — Z794 Long term (current) use of insulin: Secondary | ICD-10-CM

## 2022-01-23 DIAGNOSIS — E1169 Type 2 diabetes mellitus with other specified complication: Secondary | ICD-10-CM

## 2022-01-23 MED ORDER — PROMETHAZINE-DM 6.25-15 MG/5ML PO SYRP: 5.0000 mL | ORAL_SOLUTION | Freq: Four times a day (QID) | ORAL | 0 refills | Status: DC | PRN

## 2022-01-23 MED ORDER — AZITHROMYCIN 250 MG PO TABS: ORAL_TABLET | ORAL | 0 refills | Status: AC

## 2022-01-23 MED ORDER — ALBUTEROL SULFATE HFA 108 (90 BASE) MCG/ACT IN AERS: 2.0000 | INHALATION_SPRAY | Freq: Four times a day (QID) | RESPIRATORY_TRACT | 0 refills | Status: DC | PRN

## 2022-01-23 NOTE — Progress Notes (Unsigned)
Established Patient Office Visit  Subjective:  Patient ID: Joel Maggio Defoor Sr., male    DOB: Feb 21, 1946  Age: 76 y.o. MRN: [id number]  CC:  Chief Complaint  Patient presents with   Follow-up    Follow up HTN and HLD DM patient has had cough for 1 month its getting worse tessalon doesn't work promethazine does    HPI Joel Antonelli Moch Sr. is a 76 y.o. male with past medical history of HTN, type II DM with HLD, RA, OA and obesity who presents for f/u of his chronic medical conditions.  HTN: BP is well-controlled. Takes medications regularly. Patient denies headache, dizziness, chest pain, dyspnea or palpitations.   Type II DM with HLD: Followed by Dr. Buddy Duty at [facility].  He has an insulin pump. His last HbA1C was 7.2 in 07/2021.  He denies any fatigue, polyuria or polydipsia currently.  He is on statin for HLD.  He complains of cough with clear expectoration for the last 1 month.  He has also noticed mild dyspnea and wheezing.  He is a non-smoker. He denies any fever, chills.  Denies any hemoptysis.   Past Medical History:  Diagnosis Date   AKI (acute kidney injury) ([place]) 05/18/2021   Anemia    Arthritis    "all over"   Bradycardia    Chronic bronchitis ([place])    "get it just about q yr" (03/17/2014)   Daily headache    "here lately" (03/17/2014); relates to sinuses   GERD (gastroesophageal reflux disease)    Hard of hearing    hearing aides bilat   Hiatal hernia    History of blood transfusion 2008   "related to OR"   History of kidney stones    Hyperlipidemia    Pneumonia 1972 X 1   Rheumatoid arthritis ([place])    Type II diabetes mellitus ([place])    Wears glasses     Past Surgical History:  Procedure Laterality Date   broken finger      surgical repaired left hand 2nd finger    cyst removed      per left knee/posteriorly   CYSTECTOMY Left 2009   "behind knee"   INGUINAL HERNIA REPAIR Right ?2010   JOINT REPLACEMENT     KNEE ARTHROSCOPY Bilateral 1980's   PARTIAL KNEE  ARTHROPLASTY Right 08/27/2015   Procedure: RIGHT UNICOMPARTMENTAL KNEE ARTHROPLASTY;  Surgeon: Mcarthur Rossetti, MD;  Location: [facility];  Service: Orthopedics;  Laterality: Right;   REVISION TOTAL KNEE ARTHROPLASTY Left 2008   SHOULDER SURGERY Left 2019   TOTAL KNEE ARTHROPLASTY Left 2003    Family History  Problem Relation Age of Onset   Cancer Father        prostate    Social History   Socioeconomic History   Marital status: Married    Spouse name: Abe People   Number of children: 3   Years of education: College   Highest education level: Not on file  Occupational History    Employer: UNEMPLOYED  Tobacco Use   Smoking status: Former    Packs/day: 1.00    Years: 5.00    Total pack years: 5.00    Types: Cigarettes, Cigars    Quit date: 05/15/1968    Years since quitting: 53.7   Smokeless tobacco: Never  Vaping Use   Vaping Use: Never used  Substance and Sexual Activity   Alcohol use: No    Alcohol/week: 0.0 standard drinks of alcohol   Drug use: Never   Sexual  activity: Yes  Other Topics Concern   Not on file  Social History Narrative   Patient lives at home with family.   Caffeine Use: occasionally   Social Determinants of Health   Financial Resource Strain: Not on file  Food Insecurity: Not on file  Transportation Needs: Not on file  Physical Activity: Not on file  Stress: Not on file  Social Connections: Not on file  Intimate Partner Violence: Not on file    Outpatient Medications Prior to Visit  Medication Sig Dispense Refill   Abatacept (ORENCIA IV) Inject 750 mg into the vein every 28 (twenty-eight) days.     amLODipine (NORVASC) 5 MG tablet Take 1 tablet (5 mg total) by mouth daily. 90 tablet 3   Ascorbic Acid (VITAMIN C PO) Take by mouth daily.     aspirin EC 81 MG tablet Take 81 mg by mouth daily.     Calcium Carbonate-Vit D-Min (CALCIUM 1200 PO) Take by mouth daily.     Cholecalciferol (VITAMIN D3 PO) Take by mouth daily.     HUMALOG 100 UNIT/ML  injection INJ UP TO 65 UNI [place] VIA INSULIN PUMP QD     HYDROcodone-acetaminophen (NORCO) 5-325 MG tablet Take 1 tablet by mouth every 6 (six) hours as needed for moderate pain. 12 tablet 0   hydrOXYzine (VISTARIL) 25 MG capsule Take 1 capsule (25 mg total) by mouth at bedtime as needed (Insomnia). 30 capsule 5   insulin aspart (NOVOLOG) 100 UNIT/ML FlexPen Inject 1 Units into the skin 3 (three) times daily with meals. 1 unit per hour     KRILL OIL 1000 MG CAPS Take 1,000 mg by mouth daily.     Lancets (ONETOUCH ULTRASOFT) lancets FOR USE WHEN CHECKING BLOOD SUGAR QID.  3   loratadine (CLARITIN) 10 MG tablet Take 10 mg by mouth daily.     Misc Natural Products (TART CHERRY ADVANCED PO) Take by mouth daily.     Multiple Vitamin (MULTIVITAMIN WITH MINERALS) TABS Take 1 tablet by mouth daily.     Multiple Vitamins-Minerals (ZINC PO) Take by mouth daily.     ONE TOUCH ULTRA TEST test strip USE WHEN CHECKING BLOOD SUGAR QID.  3   pantoprazole (PROTONIX) 40 MG tablet TAKE 1 TABLET DAILY. NEED OFFICE VISIT AND LABS BEFORE FURTHER REFILLS 90 tablet 0   potassium chloride (KLOR-CON) 10 MEQ tablet Take by mouth.     POTASSIUM PO Take by mouth daily.     pravastatin (PRAVACHOL) 40 MG tablet Take 1 tablet (40 mg total) by mouth every evening. 90 tablet 3   Pyridoxine HCl (VITAMIN B-6 PO) Take by mouth daily.     ramipril (ALTACE) 5 MG capsule Take 1 capsule (5 mg total) by mouth daily. 90 capsule 3   tizanidine (ZANAFLEX) 2 MG capsule Take 1 capsule (2 mg total) by mouth 3 (three) times daily. 40 capsule 1   vitamin E 400 UNIT capsule Take 400 Units by mouth daily.     benzonatate (TESSALON) 100 MG capsule Take 1 capsule (100 mg total) by mouth 2 (two) times daily as needed for cough. 20 capsule 0   doxycycline (VIBRA-TABS) 100 MG tablet Take 1 tablet (100 mg total) by mouth 2 (two) times daily. 14 tablet 0   promethazine-dextromethorphan (PROMETHAZINE-DM) 6.25-15 MG/5ML syrup Take 5 mLs by mouth 4 (four)  times daily as needed for cough. 118 mL 0   No facility-administered medications prior to visit.    Allergies  Allergen Reactions  Codeine Swelling   Lipitor [Atorvastatin] Other (See Comments)    Muscle aches    Invokana [Canagliflozin] Other (See Comments)    Stomach upset    Losartan Nausea And Vomiting   Roxicodone [Oxycodone] Itching   Vicodin [Hydrocodone-Acetaminophen] Itching    ROS Review of Systems  Constitutional:  Negative for chills and fever.  HENT:  Negative for congestion and sore throat.   Eyes:  Negative for pain and discharge.  Respiratory:  Positive for cough, shortness of breath and wheezing.   Cardiovascular:  Negative for chest pain and palpitations.  Gastrointestinal:  Negative for diarrhea, nausea and vomiting.  Endocrine: Negative for polydipsia and polyuria.  Genitourinary:  Negative for dysuria and hematuria.  Musculoskeletal:  Positive for arthralgias and back pain. Negative for neck pain and neck stiffness.  Skin:  Positive for rash.  Neurological:  Negative for dizziness, weakness, numbness and headaches.  Psychiatric/Behavioral:  Positive for sleep disturbance. Negative for agitation and behavioral problems.       Objective:    Physical Exam Vitals reviewed.  Constitutional:      General: He is not in acute distress.    Appearance: He is obese. He is not diaphoretic.  HENT:     Head: Normocephalic and atraumatic.     Nose: Nose normal.     Mouth/Throat:     Mouth: Mucous membranes are moist.  Eyes:     General: No scleral icterus.    Extraocular Movements: Extraocular movements intact.  Cardiovascular:     Rate and Rhythm: Normal rate and regular rhythm.     Pulses: Normal pulses.     Heart sounds: Normal heart sounds. No murmur heard. Pulmonary:     Breath sounds: Rhonchi (B/l lower lung fields) present. No wheezing or rales.  Musculoskeletal:     Cervical back: Neck supple. No tenderness.     Right lower leg: No edema.      Left lower leg: No edema.  Skin:    General: Skin is warm.     Findings: Rash (Erythematous papule over upper back) present.     Comments: Cyst over upper back  Neurological:     General: No focal deficit present.     Mental Status: He is alert and oriented to person, place, and time.     Sensory: No sensory deficit.     Motor: No weakness.  Psychiatric:        Mood and Affect: Mood normal.        Behavior: Behavior normal.     BP 118/78 (BP Location: Left Arm, Patient Position: Sitting, Cuff Size: Normal)   Pulse 88   Resp 18   Ht '5\' 6"'  (1.676 m)   Wt 205 lb 6.4 oz (93.2 kg)   SpO2 96%   BMI 33.15 kg/m  Wt Readings from Last 3 Encounters:  01/23/22 205 lb 6.4 oz (93.2 kg)  01/17/22 210 lb (95.3 kg)  11/14/21 210 lb (95.3 kg)    Lab Results  Component Value Date   TSH 3.03 05/24/2016   Lab Results  Component Value Date   WBC 6.2 01/17/2022   HGB 13.8 01/17/2022   HCT 39.2 01/17/2022   MCV 89.7 01/17/2022   PLT 277 01/17/2022   Lab Results  Component Value Date   NA 139 01/17/2022   K 3.6 01/17/2022   CO2 26 01/17/2022   GLUCOSE 166 (H) 01/17/2022   BUN 12 01/17/2022   CREATININE 1.28 (H) 01/17/2022   BILITOT 0.8 01/17/2022  ALKPHOS 95 01/17/2022   AST 29 01/17/2022   ALT 25 01/17/2022   PROT 6.2 (L) 01/17/2022   ALBUMIN 3.5 01/17/2022   CALCIUM 8.6 (L) 01/17/2022   ANIONGAP 8 01/17/2022   EGFR 67 05/23/2021   Lab Results  Component Value Date   CHOL 120 05/20/2019   Lab Results  Component Value Date   HDL 41 05/20/2019   Lab Results  Component Value Date   LDLCALC 69 05/20/2019   Lab Results  Component Value Date   TRIG 41 05/20/2019   Lab Results  Component Value Date   CHOLHDL 2.9 05/20/2019   Lab Results  Component Value Date   HGBA1C 7.2 08/02/2021      Assessment & Plan:   Problem List Items Addressed This Visit       Cardiovascular and Mediastinum   Essential hypertension - Primary    BP Readings from Last 1  Encounters:  01/23/22 118/78  Well-controlled with amlodipine and Ramipril Counseled for compliance with the medications Advised DASH diet and moderate exercise/walking as tolerated        Respiratory   Acute bronchitis    Has chronic cough, but recent worsening with dyspnea and wheezing Started Azithromycin Avoided steroid due to his type 2 DM Albuterol PRN for dyspnea or wheezing Promethazine-DM for cough      Relevant Medications   promethazine-dextromethorphan (PROMETHAZINE-DM) 6.25-15 MG/5ML syrup   azithromycin (ZITHROMAX) 250 MG tablet   albuterol (VENTOLIN HFA) 108 (90 Base) MCG/ACT inhaler     Endocrine   Type 2 diabetes mellitus with other specified complication (HCC)    Lab Results  Component Value Date   HGBA1C 7.2 08/02/2021  Well-controlled Associated with CAD, HTN and HLD Has insulin pump, followed by Dr. Buddy Duty with [facility] Advised to follow diabetic diet On statin and ACEi F/u CMP and lipid profile Diabetic eye exam: Advised to follow up with Ophthalmology for diabetic eye exam        Other   Hyperlipidemia    On pravastatin       Meds ordered this encounter  Medications   promethazine-dextromethorphan (PROMETHAZINE-DM) 6.25-15 MG/5ML syrup    Sig: Take 5 mLs by mouth 4 (four) times daily as needed for cough.    Dispense:  118 mL    Refill:  0   azithromycin (ZITHROMAX) 250 MG tablet    Sig: Take 2 tablets on day 1, then 1 tablet daily on days 2 through 5    Dispense:  6 tablet    Refill:  0   albuterol (VENTOLIN HFA) 108 (90 Base) MCG/ACT inhaler    Sig: Inhale 2 puffs into the lungs every 6 (six) hours as needed for wheezing or shortness of breath.    Dispense:  8 g    Refill:  0    Okay to substitute to generic/formulary Albuterol.    Follow-up: Return in about 4 months (around 05/25/2022) for HTN and DM.    Lindell Spar, MD

## 2022-01-23 NOTE — Patient Instructions (Signed)
Please take Azithromycin as prescribed.  Please continue to take other medications as prescribed.  Please continue to follow low carb diet and ambulate as tolerated.

## 2022-01-24 DIAGNOSIS — J4489 Other specified chronic obstructive pulmonary disease: Secondary | ICD-10-CM | POA: Insufficient documentation

## 2022-01-24 DIAGNOSIS — J209 Acute bronchitis, unspecified: Secondary | ICD-10-CM | POA: Insufficient documentation

## 2022-01-24 NOTE — Assessment & Plan Note (Addendum)
Has chronic cough, but recent worsening with dyspnea and wheezing Started Azithromycin Avoided steroid due to his type 2 DM Albuterol PRN for dyspnea or wheezing Promethazine-DM for cough

## 2022-01-24 NOTE — Assessment & Plan Note (Signed)
BP Readings from Last 1 Encounters:  01/23/22 118/78   Well-controlled with amlodipine and Ramipril Counseled for compliance with the medications Advised DASH diet and moderate exercise/walking as tolerated

## 2022-01-24 NOTE — Assessment & Plan Note (Signed)
Lab Results  Component Value Date   HGBA1C 7.2 08/02/2021   Well-controlled Associated with CAD, HTN and HLD Has insulin pump, followed by Dr. Buddy Duty with [facility] Advised to follow diabetic diet On statin and ACEi F/u CMP and lipid profile Diabetic eye exam: Advised to follow up with Ophthalmology for diabetic eye exam

## 2022-01-24 NOTE — Assessment & Plan Note (Signed)
On pravastatin.   

## 2022-01-26 NOTE — Progress Notes (Deleted)
Office Visit Note  Patient: Joel Chancy Nie Sr.             Date of Birth: 1945-09-01           MRN: [id number]             PCP: Lindell Spar, MD Referring: Lindell Spar, MD Visit Date: 02/08/2022 Occupation: '@GUAROCC'$ @  Subjective:  No chief complaint on file.   History of Present Illness: Joel Bordonaro Sandoval Sr. is a 76 y.o. male ***   Activities of Daily Living:  Patient reports morning stiffness for *** {minute/hour:19697}.   Patient {ACTIONS;DENIES/REPORTS:21021675::"Denies"} nocturnal pain.  Difficulty dressing/grooming: {ACTIONS;DENIES/REPORTS:21021675::"Denies"} Difficulty climbing stairs: {ACTIONS;DENIES/REPORTS:21021675::"Denies"} Difficulty getting out of chair: {ACTIONS;DENIES/REPORTS:21021675::"Denies"} Difficulty using hands for taps, buttons, cutlery, and/or writing: {ACTIONS;DENIES/REPORTS:21021675::"Denies"}  No Rheumatology ROS completed.   PMFS History:  Patient Active Problem List   Diagnosis Date Noted  . Acute bronchitis 01/24/2022  . Primary insomnia 10/06/2021  . Gastroesophageal reflux disease 05/18/2021  . Type 2 diabetes mellitus with other specified complication ([place]) 57/32/2025  . Long term (current) use of insulin ([place]) 05/18/2021  . Obesity 05/18/2021  . Elevated transaminase level 05/18/2021  . Tear of left rotator cuff 09/24/2017  . DDD (degenerative disc disease), cervical 10/31/2016  . Primary osteoarthritis of both hands 10/31/2016  . Primary osteoarthritis of both feet 10/31/2016  . High risk medication use 07/06/2016  . Rheumatoid arthritis involving multiple sites with positive rheumatoid factor ([place]) 06/02/2016  . Contracture, elbow, left 06/02/2016  . Osteoarthritis of right knee 08/27/2015  . S/P TKR (total knee replacement), left 08/27/2015  . Essential hypertension 05/11/2015  . CAD (coronary artery disease) 04/02/2012  . Sinus bradycardia   . Hyperlipidemia     Past Medical History:  Diagnosis Date  . AKI (acute kidney  injury) ([place]) 05/18/2021  . Anemia   . Arthritis    "all over"  . Bradycardia   . Chronic bronchitis ([place])    "get it just about q yr" (03/17/2014)  . Daily headache    "here lately" (03/17/2014); relates to sinuses  . GERD (gastroesophageal reflux disease)   . Hard of hearing    hearing aides bilat  . Hiatal hernia   . History of blood transfusion 2008   "related to OR"  . History of kidney stones   . Hyperlipidemia   . Pneumonia 1972 X 1  . Rheumatoid arthritis ([place])   . Type II diabetes mellitus ([place])   . Wears glasses     Family History  Problem Relation Age of Onset  . Cancer Father        prostate   Past Surgical History:  Procedure Laterality Date  . broken finger      surgical repaired left hand 2nd finger   . cyst removed      per left knee/posteriorly  . CYSTECTOMY Left 2009   "behind knee"  . INGUINAL HERNIA REPAIR Right ?2010  . JOINT REPLACEMENT    . KNEE ARTHROSCOPY Bilateral 1980's  . PARTIAL KNEE ARTHROPLASTY Right 08/27/2015   Procedure: RIGHT UNICOMPARTMENTAL KNEE ARTHROPLASTY;  Surgeon: Mcarthur Rossetti, MD;  Location: [facility];  Service: Orthopedics;  Laterality: Right;  . REVISION TOTAL KNEE ARTHROPLASTY Left 2008  . SHOULDER SURGERY Left 2019  . TOTAL KNEE ARTHROPLASTY Left 2003   Social History   Social History Narrative   Patient lives at home with family.   Caffeine Use: occasionally   Immunization History  Administered Date(s) Administered  . Pneumococcal  Conjugate-13 01/13/2014  . Pneumococcal Polysaccharide-23 05/15/2006, 09/16/2012, 03/29/2018     Objective: Vital Signs: There were no vitals taken for this visit.   Physical Exam   Musculoskeletal Exam: ***  CDAI Exam: CDAI Score: -- Patient Global: --; Provider Global: -- Swollen: --; Tender: -- Joint Exam 02/08/2022   No joint exam has been documented for this visit   There is currently no information documented on the homunculus. Go to the Rheumatology activity and  complete the homunculus joint exam.  Investigation: No additional findings.  Imaging: No results found.  Recent Labs: Lab Results  Component Value Date   WBC 6.2 01/17/2022   HGB 13.8 01/17/2022   PLT 277 01/17/2022   NA 139 01/17/2022   K 3.6 01/17/2022   CL 105 01/17/2022   CO2 26 01/17/2022   GLUCOSE 166 (H) 01/17/2022   BUN 12 01/17/2022   CREATININE 1.28 (H) 01/17/2022   BILITOT 0.8 01/17/2022   ALKPHOS 95 01/17/2022   AST 29 01/17/2022   ALT 25 01/17/2022   PROT 6.2 (L) 01/17/2022   ALBUMIN 3.5 01/17/2022   CALCIUM 8.6 (L) 01/17/2022   GFRAA >60 01/26/2020   QFTBGOLDPLUS Negative 07/25/2021    Speciality Comments: Orencia '750mg'$  every 4 weeks TB gold negative on Jan 2019  Procedures:  No procedures performed Allergies: Codeine, Lipitor [atorvastatin], Invokana [canagliflozin], Losartan, Roxicodone [oxycodone], and Vicodin [hydrocodone-acetaminophen]   Assessment / Plan:     Visit Diagnoses: No diagnosis found.  Orders: No orders of the defined types were placed in this encounter.  No orders of the defined types were placed in this encounter.   Face-to-face time spent with patient was *** minutes. Greater than 50% of time was spent in counseling and coordination of care.  Follow-Up Instructions: No follow-ups on file.   Earnestine Mealing, CMA  Note - This record has been created using Editor, commissioning.  Chart creation errors have been sought, but [place]al not always  have been located. Such creation errors do not reflect on  the standard of medical care.

## 2022-02-06 NOTE — Progress Notes (Signed)
Office Visit Note  Patient: Joel Pongratz Falcon Sr.             Date of Birth: Oct 18, 1945           MRN: [id number]             PCP: [name], MD Referring: [name], MD Visit Date: 02/15/2022 Occupation: @GUAROCC @  Subjective:  Hand stiffness   History of Present Illness: Joel Visalli Furness Sr. is a 76 y.o. male with history of seropositive rheumatoid arthritis, osteoarthritis, and DDD. He remains on IV Orencia 750 mg infusions every 28 days.  His most recent infusion was yesterday on 02/14/22.  He continues to tolerate Orencia without any side effects.  He has not missed any doses recently.  He states last month he was diagnosed with bronchitis and was treated with a Z-Pak.  His symptoms have completely resolved.  He denies any other recurrent infections.  He denies any new medical conditions.  He continues to experience pain and stiffness in both hands due to underlying osteoarthritis.  He has not noticed any joint swelling in his hands.  He states that on 08/10/2021 he had a right great toe injection performed at [facility] which resolved his discomfort.  He continues to have chronic pain in his knee replacements. He denies any other new concerns.   Activities of Daily Living:  Patient reports morning stiffness for all day. Patient Reports nocturnal pain.  Difficulty dressing/grooming: Denies Difficulty climbing stairs: Denies Difficulty getting out of chair: Denies Difficulty using hands for taps, buttons, cutlery, and/or writing: Denies  Review of Systems  Constitutional:  Positive for fatigue. Negative for night sweats.  HENT:  Negative for mouth sores, mouth dryness and nose dryness.   Eyes:  Negative for redness and dryness.  Respiratory:  Negative for difficulty breathing.   Cardiovascular:  Negative for chest pain, palpitations, hypertension, irregular heartbeat and swelling in legs/feet.  Gastrointestinal:  Negative for blood in stool, constipation and diarrhea.  Endocrine:  Negative for increased urination.  Genitourinary:  Negative for painful urination and involuntary urination.  Musculoskeletal:  Positive for joint pain, joint pain, myalgias, morning stiffness and myalgias. Negative for gait problem, joint swelling, muscle weakness and muscle tenderness.  Skin:  Negative for color change, rash, hair loss, nodules/bumps, skin tightness, ulcers and sensitivity to sunlight.  Allergic/Immunologic: Negative for susceptible to infections.  Neurological:  Negative for dizziness, fainting, headaches, memory loss, night sweats and weakness.  Hematological:  Negative for swollen glands.  Psychiatric/Behavioral:  Positive for sleep disturbance. Negative for depressed mood. The patient is not nervous/anxious.     PMFS History:  Patient Active Problem List   Diagnosis Date Noted   Acute bronchitis 01/24/2022   Primary insomnia 10/06/2021   Gastroesophageal reflux disease 05/18/2021   Type 2 diabetes mellitus with other specified complication (HCC) 05/18/2021   Long term (current) use of insulin ([facility]CC) 05/18/2021   Obesity 05/18/2021   Elevated transaminase level 05/18/2021   Tear of left rotator cuff 09/24/2017   DDD (degenerative disc disease), cervical 10/31/2016   Primary osteoarthritis of both hands 10/31/2016   Primary osteoarthritis of both feet 10/31/2016   High risk medication use 07/06/2016   Rheumatoid arthritis involving multiple sites with positive rheumatoid factor (HCC) 06/02/2016   Contracture, elbow, left 06/02/2016   Osteoarthritis of right knee 08/27/2015   S/P TKR (total knee replacement), left 08/27/2015   Essential hypertension 05/11/2015   CAD (coronary artery disease) 04/02/2012   Sinus bradycardia  Hyperlipidemia     Past Medical History:  Diagnosis Date   AKI (acute kidney injury) (HCC) 05/18/2021   Anemia    Arthritis    "all over"   Bradycardia    Chronic bronchitis ([facility])    "get it just about q yr" (03/17/2014)   Daily headache     "here lately" (03/17/2014); relates to sinuses   GERD (gastroesophageal reflux disease)    Hard of hearing    hearing aides bilat   Hiatal hernia    History of blood transfusion 2008   "related to OR"   History of kidney stones    Hyperlipidemia    Pneumonia 1972 X 1   Rheumatoid arthritis (HCC)    Type II diabetes mellitus (HCC)    Wears glasses     Family History  Problem Relation Age of Onset   Cancer Father        prostate   Past Surgical History:  Procedure Laterality Date   broken finger      surgical repaired left hand 2nd finger    cyst removed      per left knee/posteriorly   CYSTECTOMY Left 2009   "behind knee"   INGUINAL HERNIA REPAIR Right ?2010   JOINT REPLACEMENT     KNEE ARTHROSCOPY Bilateral 1980's   PARTIAL KNEE ARTHROPLASTY Right 08/27/2015   Procedure: RIGHT UNICOMPARTMENTAL KNEE ARTHROPLASTY;  Surgeon: Kathryne Hitch, MD;  Location: [facility];  Service: Orthopedics;  Laterality: Right;   REVISION TOTAL KNEE ARTHROPLASTY Left 2008   SHOULDER SURGERY Left 2019   TOTAL KNEE ARTHROPLASTY Left 2003   Social History   Social History Narrative   Patient lives at home with family.   Caffeine Use: occasionally   Immunization History  Administered Date(s) Administered   Pneumococcal Conjugate-13 01/13/2014   Pneumococcal Polysaccharide-23 05/15/2006, 09/16/2012, 03/29/2018     Objective: Vital Signs: BP 115/80 (BP Location: Left Arm, Patient Position: Sitting, Cuff Size: Normal)   Pulse 64   Resp 17   Ht 5\' 6"  (1.676 m)   Wt 206 lb 12.8 oz (93.8 kg)   BMI 33.38 kg/m    Physical Exam Vitals and nursing note reviewed.  Constitutional:      Appearance: He is well-developed.  HENT:     Head: Normocephalic and atraumatic.  Eyes:     Conjunctiva/sclera: Conjunctivae normal.     Pupils: Pupils are equal, round, and reactive to light.  Cardiovascular:     Rate and Rhythm: Normal rate and regular rhythm.     Heart sounds: Normal heart  sounds.  Pulmonary:     Effort: Pulmonary effort is normal.     Breath sounds: Normal breath sounds.  Abdominal:     General: Bowel sounds are normal.     Palpations: Abdomen is soft.  Musculoskeletal:     Cervical back: Normal range of motion and neck supple.  Skin:    General: Skin is warm and dry.     Capillary Refill: Capillary refill takes less than 2 seconds.  Neurological:     Mental Status: He is alert and oriented to person, place, and time.  Psychiatric:        Behavior: Behavior normal.      Musculoskeletal Exam: C-spine has limited range of motion with lateral rotation.  Postural thoracic kyphosis noted.  No midline spinal tenderness or SI joint tenderness.  Shoulder joints have good range of motion with no discomfort.  Elbow joints have good range of motion with no  tenderness or synovitis.  Wrist joints have good range of motion with no tenderness or inflammation.  No tenderness or synovitis over MCP joints.  Severe PIP and DIP thickening consistent with osteoarthritis of both hands.  Slightly incomplete fist formation.  Hip joints have good range of motion with no groin pain.  Right partial knee replacement has good range of motion with some discomfort and warmth.  Left knee replacement has good range of motion with some discomfort.  Ankle joints have good range of motion with no discomfort or tenderness.    CDAI Exam: CDAI Score: -- Patient Global: 2 mm; Provider Global: 2 mm Swollen: --; Tender: -- Joint Exam 02/15/2022   No joint exam has been documented for this visit   There is currently no information documented on the homunculus. Go to the Rheumatology activity and complete the homunculus joint exam.  Investigation: No additional findings.  Imaging: No results found.  Recent Labs: Lab Results  Component Value Date   WBC 6.2 01/17/2022   HGB 13.8 01/17/2022   PLT 277 01/17/2022   NA 139 01/17/2022   K 3.6 01/17/2022   CL 105 01/17/2022   CO2 26  01/17/2022   GLUCOSE 166 (H) 01/17/2022   BUN 12 01/17/2022   CREATININE 1.28 (H) 01/17/2022   BILITOT 0.8 01/17/2022   ALKPHOS 95 01/17/2022   AST 29 01/17/2022   ALT 25 01/17/2022   PROT 6.2 (L) 01/17/2022   ALBUMIN 3.5 01/17/2022   CALCIUM 8.6 (L) 01/17/2022   GFRAA >60 01/26/2020   QFTBGOLDPLUS Negative 07/25/2021    Speciality Comments: Orencia 750mg  every 4 weeks TB gold negative on Jan 2019  Procedures:  No procedures performed Allergies: Codeine, Lipitor [atorvastatin], Invokana [canagliflozin], Losartan, Roxicodone [oxycodone], and Vicodin [hydrocodone-acetaminophen]   Assessment / Plan:     Visit Diagnoses: Rheumatoid arthritis involving multiple sites with positive rheumatoid factor (HCC): He has no synovitis on examination today.  He has not had any signs or symptoms of a rheumatoid arthritis flare.  He has clinically been doing well on IV Orencia 750 mg IV infusions every 28 days.  His most recent infusion was administered yesterday on 02/14/2022.  He continues to tolerate Orencia without any side effects.  He was recently diagnosed with bronchitis and was treated with a Z-Pak and his symptoms have completely resolved.  He has not been experiencing any recurrent infections while on Orencia.  He continues to experience chronic pain and stiffness in both hands due to underlying osteoarthritis.  Most of his discomfort is in his PIP and DIP joints.  Tenderness over the right first and second PIP joints noted on examination today.  No active inflammation noted.  Discussed the importance of joint protection and muscle strengthening.  Overall his rheumatoid arthritis remains well controlled on IV Orencia.  No medication changes will be made at this time.  He was advised to notify us if he develops signs or symptoms of a flare.  He will follow-up in the office in 5 months or sooner if needed.  High risk medication use - IV Orencia 750 mg infusions every 28 days.  His most recent infusion  was administered on 02/14/2022.  He continues to tolerate Orencia without any side effects.  CBC and CMP updated on 01/17/22.  Lab results were reviewed with the patient today in the office.  Discussed the importance of avoiding NSAID use. TB gold negative on 07/25/21.  Patient was recently diagnosed with bronchitis and was treated with a Z-Pak.  His symptoms have completely resolved.  Discussed the importance of holding orencia if he develops signs or symptoms of an infection and to resume once the infection has completely cleared.   Elevated LFTs -His LFTs were elevated in January because of antibiotics.  LFTs normalized now.  LFTs within normal limits on 01/17/2022.  Primary osteoarthritis of both hands: He has severe PIP and DIP thickening consistent with osteoarthritis of both hands.  He experiences intermittent discomfort especially in his PIP joints.  He has stiffness in both hands lasting all day.  Discussed the importance of joint protection and muscle strengthening.  Status post right partial knee replacement -Chronic pain.   S/P TKR (total knee replacement), left: x2. Chronic pain.   Primary osteoarthritis of both feet: He is not experiencing any discomfort in his feet at this time.  He has good range of motion of both ankle joints with no tenderness or synovitis. He had a right first MTP joint cortisone injection performed on 08/10/2021 which resolved his discomfort.  He is wearing proper fitting shoes.  DDD (degenerative disc disease), cervical: He has limited range of motion with lateral rotation.  He experiences intermittent neck stiffness and crepitus.  No symptoms of radiculopathy at this time.  DDD (degenerative disc disease), lumbar - Lumbar spine x-rays 06/13/21 revealed degenerative changes and retrolisthesis at L2-L3.  Slight scoliosis.  He has no midline spinal tenderness or SI joint tenderness upon palpation.  No symptoms of radiculopathy.  Other fatigue: Stable.  Chronic  insomnia: Overall he has been sleeping well at night.  Other medical conditions are listed as follows:  History of hyperlipidemia  History of hypertension - BP was 115/80 today in the office.  History of diabetes mellitus  History of coronary artery disease  Orders: No orders of the defined types were placed in this encounter.  No orders of the defined types were placed in this encounter.    Follow-Up Instructions: Return in about 5 months (around 07/17/2022) for Rheumatoid arthritis.   Gearldine Bienenstock, PA-C  Note - This record has been created using Dragon software.  Chart creation errors have been sought, but Bry not always  have been located. Such creation errors do not reflect on  the standard of medical care.

## 2022-02-08 ENCOUNTER — Ambulatory Visit: Payer: Medicare Other | Admitting: Physician Assistant

## 2022-02-08 DIAGNOSIS — R7989 Other specified abnormal findings of blood chemistry: Secondary | ICD-10-CM

## 2022-02-08 DIAGNOSIS — Z79899 Other long term (current) drug therapy: Secondary | ICD-10-CM

## 2022-02-08 DIAGNOSIS — Z8639 Personal history of other endocrine, nutritional and metabolic disease: Secondary | ICD-10-CM

## 2022-02-08 DIAGNOSIS — M0579 Rheumatoid arthritis with rheumatoid factor of multiple sites without organ or systems involvement: Secondary | ICD-10-CM

## 2022-02-08 DIAGNOSIS — Z96652 Presence of left artificial knee joint: Secondary | ICD-10-CM

## 2022-02-08 DIAGNOSIS — Z96651 Presence of right artificial knee joint: Secondary | ICD-10-CM

## 2022-02-08 DIAGNOSIS — M5030 Other cervical disc degeneration, unspecified cervical region: Secondary | ICD-10-CM

## 2022-02-08 DIAGNOSIS — M19041 Primary osteoarthritis, right hand: Secondary | ICD-10-CM

## 2022-02-08 DIAGNOSIS — M19071 Primary osteoarthritis, right ankle and foot: Secondary | ICD-10-CM

## 2022-02-08 DIAGNOSIS — R5383 Other fatigue: Secondary | ICD-10-CM

## 2022-02-08 DIAGNOSIS — Z8679 Personal history of other diseases of the circulatory system: Secondary | ICD-10-CM

## 2022-02-08 DIAGNOSIS — M5136 Other intervertebral disc degeneration, lumbar region: Secondary | ICD-10-CM

## 2022-02-08 DIAGNOSIS — F5104 Psychophysiologic insomnia: Secondary | ICD-10-CM

## 2022-02-14 ENCOUNTER — Other Ambulatory Visit: Payer: Self-pay | Admitting: Pharmacist

## 2022-02-14 ENCOUNTER — Encounter (HOSPITAL_COMMUNITY)
Admission: RE | Admit: 2022-02-14 | Discharge: 2022-02-14 | Disposition: A | Discharge status: Home / Self Care | Payer: Medicare Other | Source: Ambulatory Visit | Attending: Rheumatology | Admitting: Rheumatology

## 2022-02-14 DIAGNOSIS — M0579 Rheumatoid arthritis with rheumatoid factor of multiple sites without organ or systems involvement: Secondary | ICD-10-CM | POA: Diagnosis not present

## 2022-02-14 DIAGNOSIS — Z79899 Other long term (current) drug therapy: Secondary | ICD-10-CM

## 2022-02-14 MED ORDER — SODIUM CHLORIDE 0.9 % IV SOLN
750.0000 mg | INTRAVENOUS | Status: DC
  Administered 2022-02-14: 750 mg via INTRAVENOUS
  Filled 2022-02-14: qty 30

## 2022-02-14 MED ORDER — ACETAMINOPHEN 325 MG PO TABS: 650.0000 mg | ORAL_TABLET | ORAL | Status: DC

## 2022-02-14 MED ORDER — DIPHENHYDRAMINE HCL 25 MG PO CAPS: 25.0000 mg | ORAL_CAPSULE | ORAL | Status: DC

## 2022-02-14 NOTE — Progress Notes (Signed)
Next infusion scheduled for Orencia IV on 07/19/22 and due for updated orders. Diagnosis: RA  Dose: '750mg'$  every 4 weeks (appropriate based on last recorded weight of 93.8kg)  Last Clinic Visit: 09/08/21 Next Clinic Visit: 02/15/22  Last infusion: 02/14/22  Labs: CBC and CMP on 01/17/22 - CBC wnl, Glucose is elevated 166.  Creatinine elevated at 1.28 and GFR borderline low at 58. Patient advised to avoid the use of all NSAIDs. Calcium is low. TB Gold: negative on 07/25/2021   Orders placed for Orencia IV x 3 doses along with premedication of acetaminophen and diphenhydramine to be administered 30 minutes before medication infusion.  Standing CBC with diff/platelet and CMP with GFR orders placed to be drawn every 2 months.  Next TB gold due 07/26/2022  Knox Saliva, PharmD, MPH, BCPS, CPP Clinical Pharmacist (Rheumatology and Pulmonology)

## 2022-02-15 ENCOUNTER — Ambulatory Visit: Payer: Medicare Other | Attending: Physician Assistant | Admitting: Physician Assistant

## 2022-02-15 ENCOUNTER — Encounter: Payer: Self-pay | Admitting: Physician Assistant

## 2022-02-15 VITALS — BP 115/80 | HR 64 | Resp 17 | Ht 66.0 in | Wt 206.8 lb

## 2022-02-15 DIAGNOSIS — R5383 Other fatigue: Secondary | ICD-10-CM | POA: Diagnosis not present

## 2022-02-15 DIAGNOSIS — M19042 Primary osteoarthritis, left hand: Secondary | ICD-10-CM | POA: Insufficient documentation

## 2022-02-15 DIAGNOSIS — M19041 Primary osteoarthritis, right hand: Secondary | ICD-10-CM | POA: Diagnosis not present

## 2022-02-15 DIAGNOSIS — Z96652 Presence of left artificial knee joint: Secondary | ICD-10-CM | POA: Insufficient documentation

## 2022-02-15 DIAGNOSIS — Z8679 Personal history of other diseases of the circulatory system: Secondary | ICD-10-CM | POA: Diagnosis not present

## 2022-02-15 DIAGNOSIS — M5030 Other cervical disc degeneration, unspecified cervical region: Secondary | ICD-10-CM | POA: Diagnosis not present

## 2022-02-15 DIAGNOSIS — Z8639 Personal history of other endocrine, nutritional and metabolic disease: Secondary | ICD-10-CM | POA: Insufficient documentation

## 2022-02-15 DIAGNOSIS — M19071 Primary osteoarthritis, right ankle and foot: Secondary | ICD-10-CM

## 2022-02-15 DIAGNOSIS — F5104 Psychophysiologic insomnia: Secondary | ICD-10-CM | POA: Diagnosis not present

## 2022-02-15 DIAGNOSIS — M5136 Other intervertebral disc degeneration, lumbar region: Secondary | ICD-10-CM | POA: Diagnosis not present

## 2022-02-15 DIAGNOSIS — Z79899 Other long term (current) drug therapy: Secondary | ICD-10-CM

## 2022-02-15 DIAGNOSIS — Z96651 Presence of right artificial knee joint: Secondary | ICD-10-CM | POA: Insufficient documentation

## 2022-02-15 DIAGNOSIS — M19072 Primary osteoarthritis, left ankle and foot: Secondary | ICD-10-CM | POA: Diagnosis not present

## 2022-02-15 DIAGNOSIS — R7989 Other specified abnormal findings of blood chemistry: Secondary | ICD-10-CM | POA: Diagnosis not present

## 2022-02-15 DIAGNOSIS — M0579 Rheumatoid arthritis with rheumatoid factor of multiple sites without organ or systems involvement: Secondary | ICD-10-CM | POA: Diagnosis not present

## 2022-02-15 NOTE — Patient Instructions (Signed)

## 2022-02-24 ENCOUNTER — Encounter: Payer: Self-pay | Admitting: Internal Medicine

## 2022-02-24 ENCOUNTER — Ambulatory Visit (INDEPENDENT_AMBULATORY_CARE_PROVIDER_SITE_OTHER): Payer: Medicare Other | Admitting: Internal Medicine

## 2022-02-24 DIAGNOSIS — J309 Allergic rhinitis, unspecified: Secondary | ICD-10-CM | POA: Insufficient documentation

## 2022-02-24 MED ORDER — FLUTICASONE PROPIONATE 50 MCG/ACT NA SUSP: 2.0000 | Freq: Every day | NASAL | 6 refills | Status: DC

## 2022-02-24 MED ORDER — CETIRIZINE HCL 10 MG PO TABS: 10.0000 mg | ORAL_TABLET | Freq: Every day | ORAL | 3 refills | Status: DC

## 2022-02-24 NOTE — Patient Instructions (Signed)
Please start taking Zyrtec for allergies.  Please use Flonase for nasal congestion.  Please avoid use of Sudafed for more than 5 consecutive days.

## 2022-02-24 NOTE — Assessment & Plan Note (Addendum)
He is chronic nasal congestion and postnasal drip likely due to allergic sinusitis Also has watery eyes, which suggests an allergic etiology Started Zyrtec Flonase as needed for nasal congestion Avoid using Sudafed for more than 5 consecutive days

## 2022-02-24 NOTE — Progress Notes (Signed)
Virtual Visit via Telephone Note   This visit type was conducted via telephone. This format is felt to be most appropriate for this patient at this time.  The patient did not have access to video technology/had technical difficulties with video requiring transitioning to audio format only (telephone).  All issues noted in this document were discussed and addressed.  No physical exam could be performed with this format.  Evaluation Performed:  Follow-up visit  Date:  02/24/2022   ID:  Joel Boston Petrovich Sr., DOB 1945-12-19, MRN [id number]  Patient Location: Home Provider Location: Office/Clinic  Participants: Patient Location of Patient: Home Location of Provider: Telehealth Consent was obtain for visit to be over via telehealth. I verified that I am speaking with the correct person using two identifiers.  PCP:  Lindell Spar, MD   Chief Complaint: Nasal congestion and watery eyes  History of Present Illness:    Joel Kreuser Ferrentino Sr. is a 76 y.o. male who has a televisit for complaint of nasal congestion, sinus pressure related headache, postnasal drip and watery eyes, which are present for the last 2 months.  He was treated with azithromycin for acute sinusitis the last visit.  He currently denies any fever, chills.  He has chronic dyspnea and wheezing, for which he uses albuterol inhaler.  He has been using nasal saline spray as needed for nasal congestion.  He has also tried taking Sudafed without much relief.  The patient does not have symptoms concerning for COVID-19 infection (fever, chills, cough, or new shortness of breath).   Past Medical, Surgical, Social History, Allergies, and Medications have been Reviewed.  Past Medical History:  Diagnosis Date   AKI (acute kidney injury) ([place]) 05/18/2021   Anemia    Arthritis    "all over"   Bradycardia    Chronic bronchitis ([place])    "get it just about q yr" (03/17/2014)   Daily headache    "here lately" (03/17/2014); relates to sinuses    GERD (gastroesophageal reflux disease)    Hard of hearing    hearing aides bilat   Hiatal hernia    History of blood transfusion 2008   "related to OR"   History of kidney stones    Hyperlipidemia    Pneumonia 1972 X 1   Rheumatoid arthritis ([place])    Type II diabetes mellitus ([place])    Wears glasses    Past Surgical History:  Procedure Laterality Date   broken finger      surgical repaired left hand 2nd finger    cyst removed      per left knee/posteriorly   CYSTECTOMY Left 2009   "behind knee"   INGUINAL HERNIA REPAIR Right ?2010   JOINT REPLACEMENT     KNEE ARTHROSCOPY Bilateral 1980's   PARTIAL KNEE ARTHROPLASTY Right 08/27/2015   Procedure: RIGHT UNICOMPARTMENTAL KNEE ARTHROPLASTY;  Surgeon: Mcarthur Rossetti, MD;  Location: [facility];  Service: Orthopedics;  Laterality: Right;   REVISION TOTAL KNEE ARTHROPLASTY Left 2008   SHOULDER SURGERY Left 2019   TOTAL KNEE ARTHROPLASTY Left 2003     Current Meds  Medication Sig   Abatacept (ORENCIA IV) Inject 750 mg into the vein every 28 (twenty-eight) days.   albuterol (VENTOLIN HFA) 108 (90 Base) MCG/ACT inhaler Inhale 2 puffs into the lungs every 6 (six) hours as needed for wheezing or shortness of breath.   amLODipine (NORVASC) 5 MG tablet Take 1 tablet (5 mg total) by mouth daily.   Ascorbic  Acid (VITAMIN C PO) Take by mouth daily.   aspirin EC 81 MG tablet Take 81 mg by mouth daily.   Calcium Carbonate-Vit D-Min (CALCIUM 1200 PO) Take by mouth daily.   cetirizine (ZYRTEC) 10 MG tablet Take 1 tablet (10 mg total) by mouth at bedtime.   Cholecalciferol (VITAMIN D3 PO) Take by mouth daily.   fluticasone (FLONASE) 50 MCG/ACT nasal spray Place 2 sprays into both nostrils daily.   HUMALOG 100 UNIT/ML injection INJ UP TO 65 UNI [place] VIA INSULIN PUMP QD   HYDROcodone-acetaminophen (NORCO) 5-325 MG tablet Take 1 tablet by mouth every 6 (six) hours as needed for moderate pain.   hydrOXYzine (VISTARIL) 25 MG capsule Take 1 capsule  (25 mg total) by mouth at bedtime as needed (Insomnia).   insulin aspart (NOVOLOG) 100 UNIT/ML FlexPen Inject 1 Units into the skin 3 (three) times daily with meals. 1 unit per hour   KRILL OIL 1000 MG CAPS Take 1,000 mg by mouth daily.   Lancets (ONETOUCH ULTRASOFT) lancets FOR USE WHEN CHECKING BLOOD SUGAR QID.   Misc Natural Products (TART CHERRY ADVANCED PO) Take by mouth daily.   Multiple Vitamin (MULTIVITAMIN WITH MINERALS) TABS Take 1 tablet by mouth daily.   Multiple Vitamins-Minerals (ZINC PO) Take by mouth daily.   ONE TOUCH ULTRA TEST test strip USE WHEN CHECKING BLOOD SUGAR QID.   pantoprazole (PROTONIX) 40 MG tablet TAKE 1 TABLET DAILY. NEED OFFICE VISIT AND LABS BEFORE FURTHER REFILLS   potassium chloride (KLOR-CON) 10 MEQ tablet Take by mouth.   POTASSIUM PO Take by mouth daily.   pravastatin (PRAVACHOL) 40 MG tablet Take 1 tablet (40 mg total) by mouth every evening.   promethazine-dextromethorphan (PROMETHAZINE-DM) 6.25-15 MG/5ML syrup Take 5 mLs by mouth 4 (four) times daily as needed for cough.   Pyridoxine HCl (VITAMIN B-6 PO) Take by mouth daily.   ramipril (ALTACE) 5 MG capsule Take 1 capsule (5 mg total) by mouth daily.   tizanidine (ZANAFLEX) 2 MG capsule Take 1 capsule (2 mg total) by mouth 3 (three) times daily.   vitamin E 400 UNIT capsule Take 400 Units by mouth daily.   [DISCONTINUED] loratadine (CLARITIN) 10 MG tablet Take 10 mg by mouth daily.     Allergies:   Codeine, Lipitor [atorvastatin], Invokana [canagliflozin], Losartan, Roxicodone [oxycodone], and Vicodin [hydrocodone-acetaminophen]   ROS:   Please see the history of present illness.     All other systems reviewed and are negative.   Labs/Other Tests and Data Reviewed:    Recent Labs: 01/17/2022: ALT 25; BUN 12; Creatinine, Ser 1.28; Hemoglobin 13.8; Platelets 277; Potassium 3.6; Sodium 139   Recent Lipid Panel Lab Results  Component Value Date/Time   CHOL 120 05/20/2019 11:02 AM   TRIG 41  05/20/2019 11:02 AM   HDL 41 05/20/2019 11:02 AM   CHOLHDL 2.9 05/20/2019 11:02 AM   CHOLHDL 3.6 01/23/2019 08:11 AM   LDLCALC 69 05/20/2019 11:02 AM    Wt Readings from Last 3 Encounters:  02/15/22 206 lb 12.8 oz (93.8 kg)  02/14/22 210 lb (95.3 kg)  01/23/22 205 lb 6.4 oz (93.2 kg)     ASSESSMENT & PLAN:    Allergic sinusitis He is chronic nasal congestion and postnasal drip likely due to allergic sinusitis Also has watery eyes, which suggests an allergic etiology Started Zyrtec Flonase as needed for nasal congestion Avoid using Sudafed for more than 5 consecutive days   Time:   Today, I have spent 8 minutes reviewing the  chart, including problem list, medications, and with the patient with telehealth technology discussing the above problems.   Medication Adjustments/Labs and Tests Ordered: Current medicines are reviewed at length with the patient today.  Concerns regarding medicines are outlined above.   Tests Ordered: No orders of the defined types were placed in this encounter.   Medication Changes: Meds ordered this encounter  Medications   cetirizine (ZYRTEC) 10 MG tablet    Sig: Take 1 tablet (10 mg total) by mouth at bedtime.    Dispense:  30 tablet    Refill:  3   fluticasone (FLONASE) 50 MCG/ACT nasal spray    Sig: Place 2 sprays into both nostrils daily.    Dispense:  16 g    Refill:  6     Note: This dictation was prepared with Dragon dictation along with smaller phrase technology. Similar sounding words can be transcribed inadequately or [place] not be corrected upon review. Any transcriptional errors that result from this process are unintentional.      Disposition:  Follow up  Signed, Lindell Spar, MD  02/24/2022 12:20 PM     [facility]

## 2022-02-27 ENCOUNTER — Ambulatory Visit (INDEPENDENT_AMBULATORY_CARE_PROVIDER_SITE_OTHER): Payer: Medicare Other

## 2022-02-27 ENCOUNTER — Encounter: Payer: Self-pay | Admitting: Physician Assistant

## 2022-02-27 ENCOUNTER — Ambulatory Visit (INDEPENDENT_AMBULATORY_CARE_PROVIDER_SITE_OTHER): Payer: Medicare Other | Admitting: Physician Assistant

## 2022-02-27 DIAGNOSIS — M25562 Pain in left knee: Secondary | ICD-10-CM | POA: Diagnosis not present

## 2022-02-27 MED ORDER — METHYLPREDNISOLONE ACETATE 40 MG/ML IJ SUSP
20.0000 mg | INTRAMUSCULAR | Status: AC | PRN
  Administered 2022-02-27: 20 mg via INTRAMUSCULAR

## 2022-02-27 MED ORDER — LIDOCAINE HCL 1 % IJ SOLN
1.0000 mL | INTRAMUSCULAR | Status: AC | PRN
  Administered 2022-02-27: 1 mL

## 2022-02-27 NOTE — Progress Notes (Signed)
Office Visit Note   Patient: Joel Viscomi Gaffey Sr.           Date of Birth: [birth year]           MRN: [id number] Visit Date: 02/27/2022              Requested by: Lindell Spar, MD [address] [place],  [place] [address] PCP: Lindell Spar, MD   Assessment & Plan: Visit Diagnoses:  1. Acute pain of left knee     Plan: He is activities as tolerated.  Follow-up with [place] as needed.  Questions encouraged and answered.  Follow-Up Instructions: Return if symptoms worsen or fail to improve.   Orders:  Orders Placed This Encounter  Procedures   Trigger Point Inj   XR Knee 1-2 Views Left   No orders of the defined types were placed in this encounter.     Procedures: Trigger Point Inj  Date/Time: 02/27/2022 2:49 PM  Performed by: Pete Pelt, PA-C Authorized by: Pete Pelt, PA-C   Indications:  Pain Total # of Trigger Points:  1 Location: lower extremity   Needle Size:  25 G Approach:  Lateral Medications #1:  1 mL lidocaine 1 %; 20 mg methylPREDNISolone acetate 40 MG/ML Patient tolerance:  Patient tolerated the procedure well with no immediate complications    Clinical Data: No additional findings.   Subjective: Chief Complaint  Patient presents with   Left Knee - Pain    HPI Joel Jones comes in today with left knee pain has been ongoing for the past month.  He reports pain lateral aspect of his left knee.  He is use Voltaren gel and Aspercreme and hemp oil without much relief.  He states he is on his knee working on a lawnmower when the pain started.  Otherwise no injury.  He is diabetic reports good control of his diabetes. Review of Systems Negative for fevers chills.  Objective: Vital Signs: There were no vitals taken for this visit.  Physical Exam General: Well-developed well-nourished male in no acute distress mood affect appropriate.  Walks with a slight antalgic gait without any assistive device. Ortho Exam Left knee full extension full  flexion.  No instability valgus varus stressing.  Surgical incisions well-healed.  Tenderness over the lateral joint line with the palpation. Specialty Comments:  No specialty comments available.  Imaging: XR Knee 1-2 Views Left  Result Date: 02/27/2022 Left knee: 2 views: Total knee arthroplasty components well-seated.  Osteophyte lateral joint line off the tibia shows an osteophyte with change in overall position compared to prior films [place] be acute in nature.  Otherwise no acute fractures or bony abnormalities.    PMFS History: Patient Active Problem List   Diagnosis Date Noted   Allergic sinusitis 02/24/2022   Acute bronchitis 01/24/2022   Primary insomnia 10/06/2021   Gastroesophageal reflux disease 05/18/2021   Type 2 diabetes mellitus with other specified complication ([place]) 76/28/3151   Long term (current) use of insulin ([place]) 05/18/2021   Obesity 05/18/2021   Elevated transaminase level 05/18/2021   Tear of left rotator cuff 09/24/2017   DDD (degenerative disc disease), cervical 10/31/2016   Primary osteoarthritis of both hands 10/31/2016   Primary osteoarthritis of both feet 10/31/2016   High risk medication use 07/06/2016   Rheumatoid arthritis involving multiple sites with positive rheumatoid factor ([place]) 06/02/2016   Contracture, elbow, left 06/02/2016   Osteoarthritis of right knee 08/27/2015   S/P TKR (total knee replacement), left  08/27/2015   Essential hypertension 05/11/2015   CAD (coronary artery disease) 04/02/2012   Sinus bradycardia    Hyperlipidemia    Past Medical History:  Diagnosis Date   AKI (acute kidney injury) ([place]) 05/18/2021   Anemia    Arthritis    "all over"   Bradycardia    Chronic bronchitis ([place])    "get it just about q yr" (03/17/2014)   Daily headache    "here lately" (03/17/2014); relates to sinuses   GERD (gastroesophageal reflux disease)    Hard of hearing    hearing aides bilat   Hiatal hernia    History of blood transfusion  2008   "related to OR"   History of kidney stones    Hyperlipidemia    Pneumonia 1972 X 1   Rheumatoid arthritis ([place])    Type II diabetes mellitus ([place])    Wears glasses     Family History  Problem Relation Age of Onset   Cancer Father        prostate    Past Surgical History:  Procedure Laterality Date   broken finger      surgical repaired left hand 2nd finger    cyst removed      per left knee/posteriorly   CYSTECTOMY Left 2009   "behind knee"   INGUINAL HERNIA REPAIR Right ?2010   JOINT REPLACEMENT     KNEE ARTHROSCOPY Bilateral 1980's   PARTIAL KNEE ARTHROPLASTY Right 08/27/2015   Procedure: RIGHT UNICOMPARTMENTAL KNEE ARTHROPLASTY;  Surgeon: Mcarthur Rossetti, MD;  Location: [facility];  Service: Orthopedics;  Laterality: Right;   REVISION TOTAL KNEE ARTHROPLASTY Left 2008   SHOULDER SURGERY Left 2019   TOTAL KNEE ARTHROPLASTY Left 2003   Social History   Occupational History    Employer: UNEMPLOYED  Tobacco Use   Smoking status: Former    Packs/day: 1.00    Years: 5.00    Total pack years: 5.00    Types: Cigarettes, Cigars    Quit date: 05/15/1968    Years since quitting: 53.8    Passive exposure: Never   Smokeless tobacco: Never  Vaping Use   Vaping Use: Never used  Substance and Sexual Activity   Alcohol use: No    Alcohol/week: 0.0 standard drinks of alcohol   Drug use: Never   Sexual activity: Yes

## 2022-03-09 ENCOUNTER — Encounter: Payer: Medicare Other | Admitting: Internal Medicine

## 2022-03-09 ENCOUNTER — Encounter: Payer: Self-pay | Admitting: Internal Medicine

## 2022-03-10 ENCOUNTER — Other Ambulatory Visit: Payer: Self-pay | Admitting: *Deleted

## 2022-03-10 ENCOUNTER — Ambulatory Visit (INDEPENDENT_AMBULATORY_CARE_PROVIDER_SITE_OTHER): Payer: Medicare Other | Admitting: Internal Medicine

## 2022-03-10 ENCOUNTER — Ambulatory Visit (HOSPITAL_COMMUNITY)
Admission: RE | Admit: 2022-03-10 | Discharge: 2022-03-10 | Disposition: A | Discharge status: Home / Self Care | Payer: Medicare Other | Source: Ambulatory Visit | Attending: Internal Medicine | Admitting: Internal Medicine

## 2022-03-10 ENCOUNTER — Encounter: Payer: Self-pay | Admitting: Internal Medicine

## 2022-03-10 VITALS — BP 128/84 | HR 94 | Resp 18 | Ht 66.0 in | Wt 207.4 lb

## 2022-03-10 DIAGNOSIS — J0100 Acute maxillary sinusitis, unspecified: Secondary | ICD-10-CM | POA: Diagnosis not present

## 2022-03-10 DIAGNOSIS — J209 Acute bronchitis, unspecified: Secondary | ICD-10-CM

## 2022-03-10 DIAGNOSIS — R053 Chronic cough: Secondary | ICD-10-CM | POA: Diagnosis not present

## 2022-03-10 DIAGNOSIS — H109 Unspecified conjunctivitis: Secondary | ICD-10-CM

## 2022-03-10 MED ORDER — OFLOXACIN 0.3 % OP SOLN: 1.0000 [drp] | Freq: Four times a day (QID) | OPHTHALMIC | 0 refills | Status: DC

## 2022-03-10 MED ORDER — PROMETHAZINE-DM 6.25-15 MG/5ML PO SYRP: 5.0000 mL | ORAL_SOLUTION | Freq: Four times a day (QID) | ORAL | 0 refills | Status: DC | PRN

## 2022-03-10 MED ORDER — AZITHROMYCIN 250 MG PO TABS: ORAL_TABLET | ORAL | 0 refills | Status: AC

## 2022-03-10 NOTE — Patient Instructions (Signed)
Please start taking Azithromycin for sinusitis.  Please use Ofloxacin eye drops for eye infection.

## 2022-03-10 NOTE — Assessment & Plan Note (Addendum)
Has chronic cough, but recent worsening with dyspnea and wheezing Started Azithromycin Avoided steroid due to his type 2 DM Albuterol PRN for dyspnea or wheezing Promethazine-DM for cough Check CXR

## 2022-03-10 NOTE — Progress Notes (Addendum)
Acute Office Visit  Subjective:    Patient ID: Joel Maxson Ives Sr., male    DOB: 1946/04/15, 76 y.o.   MRN: [id number]  Chief Complaint  Patient presents with   Cough    Patient having cough since 02/24/22 is dry barky cough and left side of face is swollen green mucous     HPI Patient is in today for complaint of cough with greenish-yellow expectoration for the last 2 weeks.  She has a history of recurrent bronchitis.  Today, he also reports left-sided facial pain and left-sided ear fullness.  He has been using hair pin and Q-tip for cleaning purposes.  He has chronic nasal congestion, but has recent worsening of sinus pressure related left-sided frontal headache and pressure behind left eye.  He denies any fever or chills currently.  He has mild wheezing, for which he uses albuterol inhaler.  He has also been using room Promethazine DM syrup as needed for cough.  He also reports left-sided eye pain and yellowish discharge from it.  He has mild burning especially at nighttime.  Denies any symptoms in the right eye.  Past Medical History:  Diagnosis Date   AKI (acute kidney injury) ([place]) 05/18/2021   Anemia    Arthritis    "all over"   Bradycardia    Chronic bronchitis ([place])    "get it just about q yr" (03/17/2014)   Daily headache    "here lately" (03/17/2014); relates to sinuses   GERD (gastroesophageal reflux disease)    Hard of hearing    hearing aides bilat   Hiatal hernia    History of blood transfusion 2008   "related to OR"   History of kidney stones    Hyperlipidemia    Pneumonia 1972 X 1   Rheumatoid arthritis ([place])    Type II diabetes mellitus ([place])    Wears glasses     Past Surgical History:  Procedure Laterality Date   broken finger      surgical repaired left hand 2nd finger    cyst removed      per left knee/posteriorly   CYSTECTOMY Left 2009   "behind knee"   INGUINAL HERNIA REPAIR Right ?2010   JOINT REPLACEMENT     KNEE ARTHROSCOPY Bilateral 1980's    PARTIAL KNEE ARTHROPLASTY Right 08/27/2015   Procedure: RIGHT UNICOMPARTMENTAL KNEE ARTHROPLASTY;  Surgeon: Mcarthur Rossetti, MD;  Location: [facility];  Service: Orthopedics;  Laterality: Right;   REVISION TOTAL KNEE ARTHROPLASTY Left 2008   SHOULDER SURGERY Left 2019   TOTAL KNEE ARTHROPLASTY Left 2003    Family History  Problem Relation Age of Onset   Cancer Father        prostate    Social History   Socioeconomic History   Marital status: Married    Spouse name: Abe People   Number of children: 3   Years of education: College   Highest education level: Not on file  Occupational History    Employer: UNEMPLOYED  Tobacco Use   Smoking status: Former    Packs/day: 1.00    Years: 5.00    Total pack years: 5.00    Types: Cigarettes, Cigars    Quit date: 05/15/1968    Years since quitting: 53.8    Passive exposure: Never   Smokeless tobacco: Never  Vaping Use   Vaping Use: Never used  Substance and Sexual Activity   Alcohol use: No    Alcohol/week: 0.0 standard drinks of alcohol   Drug use:  Never   Sexual activity: Yes  Other Topics Concern   Not on file  Social History Narrative   Patient lives at home with family.   Caffeine Use: occasionally   Social Determinants of Health   Financial Resource Strain: Not on file  Food Insecurity: Not on file  Transportation Needs: Not on file  Physical Activity: Not on file  Stress: Not on file  Social Connections: Not on file  Intimate Partner Violence: Not on file    Outpatient Medications Prior to Visit  Medication Sig Dispense Refill   Abatacept (ORENCIA IV) Inject 750 mg into the vein every 28 (twenty-eight) days.     albuterol (VENTOLIN HFA) 108 (90 Base) MCG/ACT inhaler Inhale 2 puffs into the lungs every 6 (six) hours as needed for wheezing or shortness of breath. 8 g 0   amLODipine (NORVASC) 5 MG tablet Take 1 tablet (5 mg total) by mouth daily. 90 tablet 3   Ascorbic Acid (VITAMIN C PO) Take by mouth daily.      aspirin EC 81 MG tablet Take 81 mg by mouth daily.     Calcium Carbonate-Vit D-Min (CALCIUM 1200 PO) Take by mouth daily.     cetirizine (ZYRTEC) 10 MG tablet Take 1 tablet (10 mg total) by mouth at bedtime. 30 tablet 3   Cholecalciferol (VITAMIN D3 PO) Take by mouth daily.     fluticasone (FLONASE) 50 MCG/ACT nasal spray Place 2 sprays into both nostrils daily. 16 g 6   HUMALOG 100 UNIT/ML injection INJ UP TO 65 UNI [place] VIA INSULIN PUMP QD     HYDROcodone-acetaminophen (NORCO) 5-325 MG tablet Take 1 tablet by mouth every 6 (six) hours as needed for moderate pain. 12 tablet 0   hydrOXYzine (VISTARIL) 25 MG capsule Take 1 capsule (25 mg total) by mouth at bedtime as needed (Insomnia). 30 capsule 5   insulin aspart (NOVOLOG) 100 UNIT/ML FlexPen Inject 1 Units into the skin 3 (three) times daily with meals. 1 unit per hour     KRILL OIL 1000 MG CAPS Take 1,000 mg by mouth daily.     Lancets (ONETOUCH ULTRASOFT) lancets FOR USE WHEN CHECKING BLOOD SUGAR QID.  3   Misc Natural Products (TART CHERRY ADVANCED PO) Take by mouth daily.     Multiple Vitamin (MULTIVITAMIN WITH MINERALS) TABS Take 1 tablet by mouth daily.     Multiple Vitamins-Minerals (ZINC PO) Take by mouth daily.     ONE TOUCH ULTRA TEST test strip USE WHEN CHECKING BLOOD SUGAR QID.  3   pantoprazole (PROTONIX) 40 MG tablet TAKE 1 TABLET DAILY. NEED OFFICE VISIT AND LABS BEFORE FURTHER REFILLS 90 tablet 0   potassium chloride (KLOR-CON) 10 MEQ tablet Take by mouth.     POTASSIUM PO Take by mouth daily.     pravastatin (PRAVACHOL) 40 MG tablet Take 1 tablet (40 mg total) by mouth every evening. 90 tablet 3   Pyridoxine HCl (VITAMIN B-6 PO) Take by mouth daily.     ramipril (ALTACE) 5 MG capsule Take 1 capsule (5 mg total) by mouth daily. 90 capsule 3   tizanidine (ZANAFLEX) 2 MG capsule Take 1 capsule (2 mg total) by mouth 3 (three) times daily. 40 capsule 1   vitamin E 400 UNIT capsule Take 400 Units by mouth daily.      promethazine-dextromethorphan (PROMETHAZINE-DM) 6.25-15 MG/5ML syrup Take 5 mLs by mouth 4 (four) times daily as needed for cough. 118 mL 0   No facility-administered medications prior to  visit.    Allergies  Allergen Reactions   Codeine Swelling   Lipitor [Atorvastatin] Other (See Comments)    Muscle aches    Invokana [Canagliflozin] Other (See Comments)    Stomach upset    Losartan Nausea And Vomiting   Roxicodone [Oxycodone] Itching   Vicodin [Hydrocodone-Acetaminophen] Itching    Review of Systems  Constitutional:  Negative for chills and fever.  HENT:  Positive for congestion, postnasal drip and sinus pressure. Negative for sore throat.   Eyes:  Positive for pain and discharge.  Respiratory:  Positive for cough, shortness of breath and wheezing.   Cardiovascular:  Negative for chest pain and palpitations.  Gastrointestinal:  Negative for diarrhea, nausea and vomiting.  Endocrine: Negative for polydipsia and polyuria.  Genitourinary:  Negative for dysuria and hematuria.  Musculoskeletal:  Positive for arthralgias and back pain. Negative for neck pain and neck stiffness.  Skin:  Positive for rash.  Neurological:  Negative for dizziness, weakness, numbness and headaches.  Psychiatric/Behavioral:  Positive for sleep disturbance. Negative for agitation and behavioral problems.        Objective:    Physical Exam Vitals reviewed.  Constitutional:      General: He is not in acute distress.    Appearance: He is obese. He is not diaphoretic.  HENT:     Head: Normocephalic and atraumatic.     Left Ear: A middle ear effusion is present.     Nose: Congestion present.     Right Sinus: Maxillary sinus tenderness present.     Left Sinus: Maxillary sinus tenderness present.     Mouth/Throat:     Mouth: Mucous membranes are moist.  Eyes:     General: No scleral icterus.       Left eye: Discharge present.    Extraocular Movements: Extraocular movements intact.  Cardiovascular:      Rate and Rhythm: Normal rate and regular rhythm.     Pulses: Normal pulses.     Heart sounds: Normal heart sounds. No murmur heard. Pulmonary:     Breath sounds: Rhonchi (B/l lower lung fields) present. No wheezing or rales.  Musculoskeletal:     Cervical back: Neck supple. No tenderness.     Right lower leg: No edema.     Left lower leg: No edema.  Skin:    General: Skin is warm.     Findings: Rash (Erythematous papule over upper back) present.     Comments: Cyst over upper back  Neurological:     General: No focal deficit present.     Mental Status: He is alert and oriented to person, place, and time.     Sensory: No sensory deficit.     Motor: No weakness.  Psychiatric:        Mood and Affect: Mood normal.        Behavior: Behavior normal.     BP 128/84 (BP Location: Left Arm, Patient Position: Sitting, Cuff Size: Normal)   Pulse 94   Resp 18   Ht '5\' 6"'$  (1.676 m)   Wt 207 lb 6.4 oz (94.1 kg)   SpO2 97%   BMI 33.48 kg/m  Wt Readings from Last 3 Encounters:  03/10/22 207 lb 6.4 oz (94.1 kg)  02/15/22 206 lb 12.8 oz (93.8 kg)  02/14/22 210 lb (95.3 kg)        Assessment & Plan:   Problem List Items Addressed This Visit       Respiratory   Acute bronchitis - Primary  Has chronic cough, but recent worsening with dyspnea and wheezing Started Azithromycin Avoided steroid due to his type 2 DM Albuterol PRN for dyspnea or wheezing Promethazine-DM for cough Check CXR      Relevant Orders   DG Chest 2 View   Other Visit Diagnoses     Acute non-recurrent maxillary sinusitis     Has history of allergic sinusitis, uses Flonase Currently has left-sided facial pain and pain behind left eye-likely has acute maxillary and ethmoidal sinusitis Started  azithromycin for empiric coverage Concern for otitis media as well, but antibiotic should provide coverage    Relevant Medications   azithromycin (ZITHROMAX) 250 MG tablet   Bacterial conjunctivitis of left eye      Has unilateral erythema and discharge-likely bacterial conjunctivitis Started ofloxacin eyedrops   Relevant Medications   azithromycin (ZITHROMAX) 250 MG tablet   ofloxacin (OCUFLOX) 0.3 % ophthalmic solution        Meds ordered this encounter  Medications   azithromycin (ZITHROMAX) 250 MG tablet    Sig: Take 2 tablets on day 1, then 1 tablet daily on days 2 through 5    Dispense:  6 tablet    Refill:  0   ofloxacin (OCUFLOX) 0.3 % ophthalmic solution    Sig: Place 1 drop into the left eye 4 (four) times daily.    Dispense:  5 mL    Refill:  0     Akemi Overholser Keith Rake, MD

## 2022-03-10 NOTE — Progress Notes (Signed)
ERRONEOUS ENCOUNTER PLEASE DISREGARD

## 2022-03-13 ENCOUNTER — Telehealth: Payer: Self-pay

## 2022-03-13 ENCOUNTER — Telehealth: Payer: Self-pay | Admitting: Pharmacist

## 2022-03-13 NOTE — Telephone Encounter (Signed)
Spoke with patient's wife, Dalene Seltzer. States patient started Z-pack for acute bronchitis and ear infection. She is inquiring when patient can r/s infusion. Advised that he has to complete full course of abx and symptoms need to be fully resolved to r/s infusion.  Advised that if symptoms remain persistent, he should reach back out to PCP for f/u.   They verbalized understanding  Knox Saliva, PharmD, MPH, BCPS, CPP Clinical Pharmacist (Rheumatology and Pulmonology)

## 2022-03-13 NOTE — Telephone Encounter (Signed)
Patient spouse called to go over xray results. Please return call.

## 2022-03-14 ENCOUNTER — Inpatient Hospital Stay (HOSPITAL_COMMUNITY): Admission: RE | Admit: 2022-03-14 | Payer: Medicare Other | Source: Ambulatory Visit

## 2022-03-20 ENCOUNTER — Other Ambulatory Visit: Payer: Self-pay | Admitting: Internal Medicine

## 2022-03-20 ENCOUNTER — Telehealth: Payer: Self-pay | Admitting: Internal Medicine

## 2022-03-20 DIAGNOSIS — H65112 Acute and subacute allergic otitis media (mucoid) (sanguinous) (serous), left ear: Secondary | ICD-10-CM

## 2022-03-20 NOTE — Telephone Encounter (Signed)
Pt wife called stating his ear is no better & wants to know if you can please refer him to ENT?   Can you please let patient know when referral is sent?

## 2022-03-29 ENCOUNTER — Ambulatory Visit (HOSPITAL_COMMUNITY)
Admission: RE | Admit: 2022-03-29 | Discharge: 2022-03-29 | Disposition: A | Discharge status: Home / Self Care | Payer: Medicare Other | Source: Ambulatory Visit | Attending: Rheumatology | Admitting: Rheumatology

## 2022-03-29 DIAGNOSIS — Z79899 Other long term (current) drug therapy: Secondary | ICD-10-CM | POA: Diagnosis not present

## 2022-03-29 DIAGNOSIS — M0579 Rheumatoid arthritis with rheumatoid factor of multiple sites without organ or systems involvement: Secondary | ICD-10-CM | POA: Diagnosis not present

## 2022-03-29 LAB — CBC WITH DIFFERENTIAL/PLATELET
Abs Immature Granulocytes: 0.02 10*3/uL (ref 0.00–0.07)
Basophils Absolute: 0.1 10*3/uL (ref 0.0–0.1)
Basophils Relative: 1 %
Eosinophils Absolute: 0.1 10*3/uL (ref 0.0–0.5)
Eosinophils Relative: 3 %
HCT: 40.3 % (ref 39.0–52.0)
Hemoglobin: 14 g/dL (ref 13.0–17.0)
Immature Granulocytes: 0 %
Lymphocytes Relative: 40 %
Lymphs Abs: 2.2 10*3/uL (ref 0.7–4.0)
MCH: 31.5 pg (ref 26.0–34.0)
MCHC: 34.7 g/dL (ref 30.0–36.0)
MCV: 90.8 fL (ref 80.0–100.0)
Monocytes Absolute: 0.6 10*3/uL (ref 0.1–1.0)
Monocytes Relative: 10 %
Neutro Abs: 2.6 10*3/uL (ref 1.7–7.7)
Neutrophils Relative %: 46 %
Platelets: 341 10*3/uL (ref 150–400)
RBC: 4.44 MIL/uL (ref 4.22–5.81)
RDW: 12.9 % (ref 11.5–15.5)
WBC: 5.6 10*3/uL (ref 4.0–10.5)
nRBC: 0 % (ref 0.0–0.2)

## 2022-03-29 LAB — COMPREHENSIVE METABOLIC PANEL
ALT: 27 U/L (ref 0–44)
AST: 32 U/L (ref 15–41)
Albumin: 3.6 g/dL (ref 3.5–5.0)
Alkaline Phosphatase: 91 U/L (ref 38–126)
Anion gap: 8 (ref 5–15)
BUN: 16 mg/dL (ref 8–23)
CO2: 26 mmol/L (ref 22–32)
Calcium: 8.7 mg/dL — ABNORMAL LOW (ref 8.9–10.3)
Chloride: 105 mmol/L (ref 98–111)
Creatinine, Ser: 1.11 mg/dL (ref 0.61–1.24)
GFR, Estimated: >60 mL/min (ref >60)
Glucose, Bld: 169 mg/dL — ABNORMAL HIGH (ref 70–99)
Potassium: 3.7 mmol/L (ref 3.5–5.1)
Sodium: 139 mmol/L (ref 135–145)
Total Bilirubin: 0.7 mg/dL (ref 0.3–1.2)
Total Protein: 6.4 g/dL — ABNORMAL LOW (ref 6.5–8.1)

## 2022-03-29 MED ORDER — DIPHENHYDRAMINE HCL 25 MG PO CAPS: 25.0000 mg | ORAL_CAPSULE | ORAL | Status: DC

## 2022-03-29 MED ORDER — SODIUM CHLORIDE 0.9 % IV SOLN
750.0000 mg | INTRAVENOUS | Status: DC
  Administered 2022-03-29: 750 mg via INTRAVENOUS
  Filled 2022-03-29: qty 30

## 2022-03-29 MED ORDER — ACETAMINOPHEN 325 MG PO TABS: 650.0000 mg | ORAL_TABLET | ORAL | Status: DC

## 2022-03-29 NOTE — Progress Notes (Signed)
Glucose is elevated-169.  Calcium remains low but has improved slightly.  Total protein remains borderline low but has improved.  Rest of CMP WNL.  CBC WNL.  We will continue to monitor lab work.

## 2022-04-11 ENCOUNTER — Ambulatory Visit: Payer: Medicare Other | Admitting: Internal Medicine

## 2022-04-11 ENCOUNTER — Encounter (HOSPITAL_COMMUNITY): Payer: Medicare Other

## 2022-04-18 DIAGNOSIS — Z794 Long term (current) use of insulin: Secondary | ICD-10-CM | POA: Diagnosis not present

## 2022-04-18 DIAGNOSIS — I10 Essential (primary) hypertension: Secondary | ICD-10-CM | POA: Diagnosis not present

## 2022-04-18 DIAGNOSIS — E1122 Type 2 diabetes mellitus with diabetic chronic kidney disease: Secondary | ICD-10-CM | POA: Diagnosis not present

## 2022-04-18 DIAGNOSIS — N182 Chronic kidney disease, stage 2 (mild): Secondary | ICD-10-CM | POA: Diagnosis not present

## 2022-04-26 ENCOUNTER — Ambulatory Visit (HOSPITAL_COMMUNITY)
Admission: RE | Admit: 2022-04-26 | Discharge: 2022-04-26 | Disposition: A | Discharge status: Home / Self Care | Payer: Medicare Other | Source: Ambulatory Visit | Attending: Rheumatology | Admitting: Rheumatology

## 2022-04-26 DIAGNOSIS — M0579 Rheumatoid arthritis with rheumatoid factor of multiple sites without organ or systems involvement: Secondary | ICD-10-CM | POA: Diagnosis not present

## 2022-04-26 MED ORDER — ACETAMINOPHEN 325 MG PO TABS: 650.0000 mg | ORAL_TABLET | ORAL | Status: DC

## 2022-04-26 MED ORDER — SODIUM CHLORIDE 0.9 % IV SOLN
750.0000 mg | INTRAVENOUS | Status: DC
  Administered 2022-04-26: 750 mg via INTRAVENOUS
  Filled 2022-04-26: qty 30

## 2022-04-26 MED ORDER — DIPHENHYDRAMINE HCL 25 MG PO CAPS: 25.0000 mg | ORAL_CAPSULE | ORAL | Status: DC

## 2022-05-10 ENCOUNTER — Telehealth: Payer: Self-pay | Admitting: Pharmacist

## 2022-05-10 NOTE — Telephone Encounter (Signed)
Spoke with patient's wife regarding IV ORENCIA reverification of benefits. She states he will not have insurance change with new year.   Next infusion: 05/24/2022 Previous coverage: Medicare A/B + [employer] to reverify. Plan is active from 12/14/2011. Medicare A/B covers 80% of the infusion and no authorization is required, and the supplement would cover the 20% of the cost that was not paid for by Medicare as long as Medicare covered the medication.    Phone: [phone]  [employer] updated.   Knox Saliva, PharmD, MPH, BCPS, CPP Clinical Pharmacist (Rheumatology and Pulmonology)

## 2022-05-24 ENCOUNTER — Encounter (HOSPITAL_COMMUNITY)
Admission: RE | Admit: 2022-05-24 | Discharge: 2022-05-24 | Disposition: A | Discharge status: Home / Self Care | Payer: Medicare Other | Source: Ambulatory Visit | Attending: Rheumatology | Admitting: Rheumatology

## 2022-05-24 ENCOUNTER — Other Ambulatory Visit: Payer: Self-pay | Admitting: Pharmacist

## 2022-05-24 DIAGNOSIS — Z79899 Other long term (current) drug therapy: Secondary | ICD-10-CM | POA: Insufficient documentation

## 2022-05-24 DIAGNOSIS — M0579 Rheumatoid arthritis with rheumatoid factor of multiple sites without organ or systems involvement: Secondary | ICD-10-CM

## 2022-05-24 DIAGNOSIS — Z111 Encounter for screening for respiratory tuberculosis: Secondary | ICD-10-CM

## 2022-05-24 LAB — COMPREHENSIVE METABOLIC PANEL
ALT: 31 U/L (ref 0–44)
AST: 32 U/L (ref 15–41)
Albumin: 3.7 g/dL (ref 3.5–5.0)
Alkaline Phosphatase: 91 U/L (ref 38–126)
Anion gap: 8 (ref 5–15)
BUN: 21 mg/dL (ref 8–23)
CO2: 27 mmol/L (ref 22–32)
Calcium: 8.6 mg/dL — ABNORMAL LOW (ref 8.9–10.3)
Chloride: 101 mmol/L (ref 98–111)
Creatinine, Ser: 1.24 mg/dL (ref 0.61–1.24)
GFR, Estimated: >60 mL/min (ref >60)
Glucose, Bld: 220 mg/dL — ABNORMAL HIGH (ref 70–99)
Potassium: 4.3 mmol/L (ref 3.5–5.1)
Sodium: 136 mmol/L (ref 135–145)
Total Bilirubin: 0.9 mg/dL (ref 0.3–1.2)
Total Protein: 6.1 g/dL — ABNORMAL LOW (ref 6.5–8.1)

## 2022-05-24 LAB — CBC WITH DIFFERENTIAL/PLATELET
Abs Immature Granulocytes: 0.02 10*3/uL (ref 0.00–0.07)
Basophils Absolute: 0.1 10*3/uL (ref 0.0–0.1)
Basophils Relative: 1 %
Eosinophils Absolute: 0.2 10*3/uL (ref 0.0–0.5)
Eosinophils Relative: 3 %
HCT: 41.6 % (ref 39.0–52.0)
Hemoglobin: 13.9 g/dL (ref 13.0–17.0)
Immature Granulocytes: 0 %
Lymphocytes Relative: 35 %
Lymphs Abs: 2.2 10*3/uL (ref 0.7–4.0)
MCH: 30.8 pg (ref 26.0–34.0)
MCHC: 33.4 g/dL (ref 30.0–36.0)
MCV: 92.2 fL (ref 80.0–100.0)
Monocytes Absolute: 0.7 10*3/uL (ref 0.1–1.0)
Monocytes Relative: 12 %
Neutro Abs: 3.1 10*3/uL (ref 1.7–7.7)
Neutrophils Relative %: 49 %
Platelets: 250 10*3/uL (ref 150–400)
RBC: 4.51 MIL/uL (ref 4.22–5.81)
RDW: 12.9 % (ref 11.5–15.5)
WBC: 6.3 10*3/uL (ref 4.0–10.5)
nRBC: 0 % (ref 0.0–0.2)

## 2022-05-24 MED ORDER — ACETAMINOPHEN 325 MG PO TABS: 650.0000 mg | ORAL_TABLET | ORAL | Status: DC

## 2022-05-24 MED ORDER — DIPHENHYDRAMINE HCL 25 MG PO CAPS: 25.0000 mg | ORAL_CAPSULE | ORAL | Status: DC

## 2022-05-24 MED ORDER — SODIUM CHLORIDE 0.9 % IV SOLN
750.0000 mg | INTRAVENOUS | Status: DC
  Administered 2022-05-24: 750 mg via INTRAVENOUS
  Filled 2022-05-24: qty 30

## 2022-05-24 NOTE — Progress Notes (Signed)
Glucose is elevated.  Please notify patient and forward results to his PCP.  Calcium is low.  Total protein is low and stable.  He should consume calcium rich diet.  CBC is normal.

## 2022-05-24 NOTE — Progress Notes (Signed)
Next infusion scheduled for Orencia IV on 06/21/2022 and due for updated orders. Diagnosis: RA  Dose: '750mg'$  every 4 weeks (appropriate based on last recorded weight of 94kg)  Last Clinic Visit: 02/15/2022 Next Clinic Visit: 07/19/2022  Last infusion: 05/24/2022  Labs: CBC and CMP on 05/24/2022 TB Gold: negative on 07/25/2021   Orders placed for Orencia IV x 3 doses along with premedication of acetaminophen and diphenhydramine to be administered 30 minutes before medication infusion.  Standing CBC with diff/platelet and CMP with GFR orders placed to be drawn every 2 months.  Next TB gold due 07/26/2022 - order placed to be drawn with March 2024 infusion  Knox Saliva, PharmD, MPH, BCPS, CPP Clinical Pharmacist (Rheumatology and Pulmonology)

## 2022-05-26 ENCOUNTER — Ambulatory Visit: Payer: Medicare Other | Admitting: Internal Medicine

## 2022-05-29 ENCOUNTER — Ambulatory Visit: Payer: Medicare Other | Admitting: Internal Medicine

## 2022-05-31 ENCOUNTER — Encounter: Payer: Self-pay | Admitting: Internal Medicine

## 2022-05-31 ENCOUNTER — Ambulatory Visit (INDEPENDENT_AMBULATORY_CARE_PROVIDER_SITE_OTHER): Payer: Medicare Other | Admitting: Internal Medicine

## 2022-05-31 VITALS — BP 132/76 | HR 100 | Ht 66.0 in | Wt 207.2 lb

## 2022-05-31 DIAGNOSIS — J101 Influenza due to other identified influenza virus with other respiratory manifestations: Secondary | ICD-10-CM | POA: Diagnosis not present

## 2022-05-31 LAB — POCT INFLUENZA A/B
Influenza A, POC: POSITIVE — AB
Influenza B, POC: NEGATIVE

## 2022-05-31 MED ORDER — OSELTAMIVIR PHOSPHATE 75 MG PO CAPS: 75.0000 mg | ORAL_CAPSULE | Freq: Two times a day (BID) | ORAL | 0 refills | Status: DC

## 2022-05-31 NOTE — Progress Notes (Signed)
Acute Office Visit  Subjective:    Patient ID: Joel Vera Mcerlean Sr., male    DOB: 11-30-1945, 77 y.o.   MRN: [id number]  Chief Complaint  Patient presents with   Influenza    Patient states since going from the heat and cold air, he's developed a cough with drainage, weak, no appetite. Patient said he was around his grand kids on sunday and they are now positive with flu.    HPI Patient is in today for complaint of fever and chills since yesterday.  He also reports fatigue, myalgias and decreased appetite.  His grandson was positive for flu recently and patient met him 2 days prior to his symptom onset.  He denies any recent worsening cough, dyspnea or wheezing.  Denies any hemoptysis, nausea or vomiting.  Past Medical History:  Diagnosis Date   AKI (acute kidney injury) ([place]) 05/18/2021   Anemia    Arthritis    "all over"   Bradycardia    Chronic bronchitis ([place])    "get it just about q yr" (03/17/2014)   Daily headache    "here lately" (03/17/2014); relates to sinuses   GERD (gastroesophageal reflux disease)    Hard of hearing    hearing aides bilat   Hiatal hernia    History of blood transfusion 2008   "related to OR"   History of kidney stones    Hyperlipidemia    Pneumonia 1972 X 1   Rheumatoid arthritis ([place])    Type II diabetes mellitus ([place])    Wears glasses     Past Surgical History:  Procedure Laterality Date   broken finger      surgical repaired left hand 2nd finger    cyst removed      per left knee/posteriorly   CYSTECTOMY Left 2009   "behind knee"   INGUINAL HERNIA REPAIR Right ?2010   JOINT REPLACEMENT     KNEE ARTHROSCOPY Bilateral 1980's   PARTIAL KNEE ARTHROPLASTY Right 08/27/2015   Procedure: RIGHT UNICOMPARTMENTAL KNEE ARTHROPLASTY;  Surgeon: Mcarthur Rossetti, MD;  Location: [facility];  Service: Orthopedics;  Laterality: Right;   REVISION TOTAL KNEE ARTHROPLASTY Left 2008   SHOULDER SURGERY Left 2019   TOTAL KNEE ARTHROPLASTY Left 2003     Family History  Problem Relation Age of Onset   Cancer Father        prostate    Social History   Socioeconomic History   Marital status: Married    Spouse name: Abe People   Number of children: 3   Years of education: College   Highest education level: Not on file  Occupational History    Employer: UNEMPLOYED  Tobacco Use   Smoking status: Former    Packs/day: 1.00    Years: 5.00    Total pack years: 5.00    Types: Cigarettes, Cigars    Quit date: 05/15/1968    Years since quitting: 54.0    Passive exposure: Never   Smokeless tobacco: Never  Vaping Use   Vaping Use: Never used  Substance and Sexual Activity   Alcohol use: No    Alcohol/week: 0.0 standard drinks of alcohol   Drug use: Never   Sexual activity: Yes  Other Topics Concern   Not on file  Social History Narrative   Patient lives at home with family.   Caffeine Use: occasionally   Social Determinants of Health   Financial Resource Strain: Not on file  Food Insecurity: Not on file  Transportation Needs: Not on  file  Physical Activity: Not on file  Stress: Not on file  Social Connections: Not on file  Intimate Partner Violence: Not on file    Outpatient Medications Prior to Visit  Medication Sig Dispense Refill   Continuous Blood Gluc Sensor (FREESTYLE LIBRE 2 SENSOR) MISC . Marland Kitchen change sensor every 14 days on insulin pump for 90 days     empagliflozin (JARDIANCE) 10 MG TABS tablet 1 tablet Orally Once a day     Abatacept (ORENCIA IV) Inject 750 mg into the vein every 28 (twenty-eight) days.     albuterol (VENTOLIN HFA) 108 (90 Base) MCG/ACT inhaler Inhale 2 puffs into the lungs every 6 (six) hours as needed for wheezing or shortness of breath. 8 g 0   amLODipine (NORVASC) 5 MG tablet Take 1 tablet (5 mg total) by mouth daily. 90 tablet 3   Ascorbic Acid (VITAMIN C PO) Take by mouth daily.     Ascorbic Acid (VITAMIN C) 500 MG CHEW 1 tablet Orally daily     aspirin EC 81 MG tablet Take 81 mg by mouth  daily.     Calcium Carbonate-Vit D-Min (CALCIUM 1200 PO) Take by mouth daily.     cetirizine (ZYRTEC) 10 MG tablet Take 1 tablet (10 mg total) by mouth at bedtime. 30 tablet 3   Cholecalciferol (VITAMIN D3 PO) Take by mouth daily.     fluticasone (FLONASE) 50 MCG/ACT nasal spray Place 2 sprays into both nostrils daily. 16 g 6   HUMALOG 100 UNIT/ML injection INJ UP TO 65 UNI Combs VIA INSULIN PUMP QD     HYDROcodone-acetaminophen (NORCO) 5-325 MG tablet Take 1 tablet by mouth every 6 (six) hours as needed for moderate pain. 12 tablet 0   hydrOXYzine (VISTARIL) 25 MG capsule Take 1 capsule (25 mg total) by mouth at bedtime as needed (Insomnia). 30 capsule 5   insulin aspart (NOVOLOG) 100 UNIT/ML FlexPen Inject 1 Units into the skin 3 (three) times daily with meals. 1 unit per hour     insulin glargine (LANTUS) 100 UNIT/ML injection 16 units Subcutaneous once a day if unable to use pump for 90 days     KRILL OIL 1000 MG CAPS Take 1,000 mg by mouth daily.     Lancets (ONETOUCH ULTRASOFT) lancets FOR USE WHEN CHECKING BLOOD SUGAR QID.  3   loratadine (CLARITIN) 10 MG tablet 1 tablet Orally Once a day     Misc Natural Products (TART CHERRY ADVANCED PO) Take by mouth daily.     Multiple Vitamin (MULTIVITAMIN WITH MINERALS) TABS Take 1 tablet by mouth daily.     Multiple Vitamins-Minerals (ZINC PO) Take by mouth daily.     ofloxacin (OCUFLOX) 0.3 % ophthalmic solution Place 1 drop into the left eye 4 (four) times daily. 5 mL 0   ONE TOUCH ULTRA TEST test strip USE WHEN CHECKING BLOOD SUGAR QID.  3   pantoprazole (PROTONIX) 40 MG tablet TAKE 1 TABLET DAILY. NEED OFFICE VISIT AND LABS BEFORE FURTHER REFILLS 90 tablet 0   potassium chloride (KLOR-CON) 10 MEQ tablet Take by mouth.     POTASSIUM PO Take by mouth daily.     pravastatin (PRAVACHOL) 40 MG tablet Take 1 tablet (40 mg total) by mouth every evening. 90 tablet 3   promethazine-dextromethorphan (PROMETHAZINE-DM) 6.25-15 MG/5ML syrup Take 5 mLs by  mouth 4 (four) times daily as needed for cough. 118 mL 0   Pyridoxine HCl (VITAMIN B-6 PO) Take by mouth daily.  ramipril (ALTACE) 5 MG capsule Take 1 capsule (5 mg total) by mouth daily. 90 capsule 3   tizanidine (ZANAFLEX) 2 MG capsule Take 1 capsule (2 mg total) by mouth 3 (three) times daily. 40 capsule 1   vitamin E 400 UNIT capsule Take 400 Units by mouth daily.     No facility-administered medications prior to visit.    Allergies  Allergen Reactions   Codeine Swelling   Lipitor [Atorvastatin] Other (See Comments)    Muscle aches    Invokana [Canagliflozin] Other (See Comments)    Stomach upset    Losartan Nausea And Vomiting   Roxicodone [Oxycodone] Itching   Vicodin [Hydrocodone-Acetaminophen] Itching    Review of Systems  Constitutional:  Positive for chills, fatigue and fever.  HENT:  Positive for congestion, postnasal drip and sinus pressure. Negative for sore throat.   Eyes:  Positive for pain and discharge.  Respiratory:  Positive for cough. Negative for shortness of breath.   Cardiovascular:  Negative for chest pain and palpitations.  Gastrointestinal:  Negative for diarrhea, nausea and vomiting.  Endocrine: Negative for polydipsia and polyuria.  Genitourinary:  Negative for dysuria and hematuria.  Musculoskeletal:  Positive for arthralgias and back pain. Negative for neck pain and neck stiffness.  Skin:  Positive for rash.  Neurological:  Negative for dizziness, weakness, numbness and headaches.  Psychiatric/Behavioral:  Positive for sleep disturbance. Negative for agitation and behavioral problems.        Objective:    Physical Exam Vitals reviewed.  Constitutional:      General: He is not in acute distress.    Appearance: He is obese. He is not diaphoretic.  HENT:     Head: Normocephalic and atraumatic.     Nose: Congestion present.     Right Sinus: Maxillary sinus tenderness present.     Left Sinus: Maxillary sinus tenderness present.      Mouth/Throat:     Mouth: Mucous membranes are moist.  Eyes:     General: No scleral icterus.       Left eye: Discharge present.    Extraocular Movements: Extraocular movements intact.  Cardiovascular:     Rate and Rhythm: Normal rate and regular rhythm.     Pulses: Normal pulses.     Heart sounds: Normal heart sounds. No murmur heard. Pulmonary:     Breath sounds: Normal breath sounds. No wheezing or rales.  Musculoskeletal:     Cervical back: Neck supple. No tenderness.     Right lower leg: No edema.     Left lower leg: No edema.  Skin:    Comments: Cyst over upper back  Neurological:     General: No focal deficit present.     Mental Status: He is alert and oriented to person, place, and time.  Psychiatric:        Mood and Affect: Mood normal.        Behavior: Behavior normal.     BP 132/76 (BP Location: Right Arm, Patient Position: Sitting, Cuff Size: Large)   Pulse 100   Ht '5\' 6"'$  (1.676 m)   Wt 207 lb 3.2 oz (94 kg)   SpO2 95%   BMI 33.44 kg/m  Wt Readings from Last 3 Encounters:  05/31/22 207 lb 3.2 oz (94 kg)  05/24/22 210 lb (95.3 kg)  04/26/22 212 lb (96.2 kg)        Assessment & Plan:   Problem List Items Addressed This Visit    Visit Diagnoses  Influenza A    -  Primary Rapid flu positive Started Tamiflu Promethazine-DM PRN for cough Tylenol PRN for fever or chills Advised to maintain adequate hydration   Relevant Medications   oseltamivir (TAMIFLU) 75 MG capsule   Other Relevant Orders   POCT Influenza A/B (Completed)        Meds ordered this encounter  Medications   oseltamivir (TAMIFLU) 75 MG capsule    Sig: Take 1 capsule (75 mg total) by mouth 2 (two) times daily.    Dispense:  10 capsule    Refill:  0     Jakim Drapeau Keith Rake, MD

## 2022-06-01 ENCOUNTER — Ambulatory Visit: Payer: Medicare Other | Admitting: Orthopaedic Surgery

## 2022-06-05 ENCOUNTER — Ambulatory Visit: Payer: Medicare Other | Admitting: Internal Medicine

## 2022-06-19 ENCOUNTER — Ambulatory Visit (INDEPENDENT_AMBULATORY_CARE_PROVIDER_SITE_OTHER): Payer: Medicare Other | Admitting: Orthopaedic Surgery

## 2022-06-19 DIAGNOSIS — M79605 Pain in left leg: Secondary | ICD-10-CM

## 2022-06-19 NOTE — Progress Notes (Signed)
Mr. Dowis comes in with continued chronic pain as a relates to his left lateral leg.  He has a complex history of a knee revision arthroplasty that was done many years ago.  Recent x-rays showed that the components are intact and there is no evidence of loosening.  He still has no issues with the knee itself.  His pain has been along the lateral aspect of the upper leg and this seems to be more muscle related.  He had seen one of the physician assistants here who placed a steroid appropriately around this area.  He said the injection really did not help long but it did help somewhat.  On my exam today he has intact motor function in his foot on the left side in terms of inversion and eversion of the foot and flexion extension with good strength.  When I palpate along the lateral muscle group of the left leg proximally he does have some pain in this area.  There is no lesions of the skin that I can see and no masses.  I would like to send him to my partner Dr. Rolena Infante for an ultrasound evaluation of this area and for Dr. Rolena Infante to see if there is any other recommendations that he would envision for dealing with muscle pain in this area and including any interventions that he Lensing offer.  We will work on getting that appointment.  I would then like to have Dr. Rolena Infante in about my way in about 4 to 6 weeks after any type of procedure.  He does have a partial knee arthroplasty on the right side and now has significant arthritis in the other compartments of his knee and he needs conversion to a total knee arthroplasty at some point.

## 2022-06-20 ENCOUNTER — Ambulatory Visit (INDEPENDENT_AMBULATORY_CARE_PROVIDER_SITE_OTHER): Payer: Medicare Other | Admitting: Sports Medicine

## 2022-06-20 ENCOUNTER — Encounter: Payer: Self-pay | Admitting: Sports Medicine

## 2022-06-20 ENCOUNTER — Ambulatory Visit: Payer: Self-pay

## 2022-06-20 DIAGNOSIS — S86112A Strain of other muscle(s) and tendon(s) of posterior muscle group at lower leg level, left leg, initial encounter: Secondary | ICD-10-CM | POA: Diagnosis not present

## 2022-06-20 DIAGNOSIS — M79605 Pain in left leg: Secondary | ICD-10-CM

## 2022-06-20 DIAGNOSIS — Z96652 Presence of left artificial knee joint: Secondary | ICD-10-CM | POA: Diagnosis not present

## 2022-06-20 NOTE — Progress Notes (Signed)
Joel Boston Defranco Sr. - 77 y.o. male MRN [id number]  Date of birth: [birth year]  Office Visit Note: Visit Date: 06/20/2022 PCP: Lindell Spar, MD Referred by: Lindell Spar, MD  Subjective: Chief Complaint  Patient presents with   Left Leg - Pain   HPI: Joel Jones Sr. is a pleasant 77 y.o. male who presents Jones for left calf and leg pain.   He has had left lateral knee and calf pain for many months. Reported at the start of his pain he was working outside and when he pivoted he felt a pulling sensation over the lateral knee. Has noticed some swelling in the area and pain that radiates down into the proximal lateral calf, tender to touch here. Reports the left calf appears larger than the right. Denies any current or previous history of DVT. No CP or SOB. Has put heat on this and topical voltaren gel without significant relief.   Pertinent ROS were reviewed with the patient and found to be negative unless otherwise specified above in HPI.   Assessment & Plan: Visit Diagnoses:  1. Gastrocnemius muscle tear, left, initial encounter   2. Pain in left leg   3. S/P TKR (total knee replacement), left    Plan: Discussed with Joel Jones that based on his mechanism of injury and [place] scan Jones, I do believe he has a high-grade tear of the lateral head of the gastrocnemius tendon. His strength and function is pretty well-preserved, moreso dealing with pain so I do not think there is a need for surgical intervention. We did do a trial of extracorporeal shockwave treatment Jones, I would like to see how he responds to this in the coming days. Will want to repeat treatment in 1 week and then decide from there if this is beneficial. We discussed formal PT vs. HEP and he will consider this in upcoming appointments. Recommend calf compression sleeve as well. F/u in 1 week.   Follow-up: Return in about 1 week (around 06/27/2022) for for left calf pain.   Meds & Orders: No orders of the defined types  were placed in this encounter.   Orders Placed This Encounter  Procedures   [place] Extrem Low Left Ltd     Procedures: Procedure: ECSWT Indications:  Muscle tear, scar tissue   Procedure Details Consent: Risks of procedure as well as the alternatives and risks of each were explained to the patient.  Verbal consent for procedure obtained. Time Out: Verified patient identification, verified procedure, site was marked, verified correct patient position. The area was cleaned with alcohol swab.     The left proximal lateral gastrocnemius tendon/muscle was targeted for Extracorporeal shockwave therapy.    Preset: Status post muscular injury Power Level: 120 mJ Frequency: 13-14 Hz Impulse/cycles: 3000 Head size: Regular   Patient tolerated procedure well without immediate complications.        Clinical History: No specialty comments available.  He reports that he quit smoking about 54 years ago. His smoking use included cigarettes and cigars. He has a 5.00 pack-year smoking history. He has never been exposed to tobacco smoke. He has never used smokeless tobacco.  Recent Labs    08/02/21 0000  HGBA1C 7.2    Objective:    Physical Exam  Gen: Well-appearing, in no acute distress; non-toxic CV: Well-perfused. Warm.  Resp: Breathing unlabored on room air; no wheezing. Psych: Fluid speech in conversation; appropriate affect; normal thought process Neuro: Sensation intact throughout. No gross coordination deficits.  Ortho Exam - Left leg/knee: There is some localized soft tissue swelling over the lateral distal knee and proximal calf. + TTP over the proximal portion of the Lat gastrocnemius tendon. FROM at ankle with good strength. Negative Homan's sign, no pain palpable in popliteal fossa. Well-healed prior surgical incision.  Imaging: [place] Extrem Low Left Ltd  Result Date: 06/20/2022 Limited MSK ultrasound of the left lower extremity was performed. Evaluation of the lateral knee  joint shows overlying lateral quadricep musculature within abnormality. There is hypoechoic fluid and evidence of high-grade tearing of the proximal lateral head of the gastrocnemius tendon near the insertion on the lateral condyle of the femur. Notable hyperemia in this region. There is a small amount of hypoechoic fluid within the posterior knee joint as well. Scanning distally in both short and long-axis there is evidence of tearing and hyperemia of the proximal lateral head of the gastrocnemius tendon and muscle belly, associated pain with sonopalpation here. MHG and overlying soleus appears intact.        Left knee x-ray, 2 views, 02/27/22: Left knee: 2 views: Total knee arthroplasty components well-seated.   Osteophyte lateral joint line off the tibia shows an osteophyte with  change in overall position compared to prior films Hane be acute in nature.   Otherwise no acute fractures or bony abnormalities.    Past Medical/Family/Surgical/Social History: Medications & Allergies reviewed per EMR, new medications updated. Patient Active Problem List   Diagnosis Date Noted   Allergic sinusitis 02/24/2022   Acute bronchitis 01/24/2022   Primary insomnia 10/06/2021   Gastroesophageal reflux disease 05/18/2021   Type 2 diabetes mellitus with other specified complication ([place]) 41/93/7902   Long term (current) use of insulin ([place]) 05/18/2021   Obesity 05/18/2021   Elevated transaminase level 05/18/2021   Tear of left rotator cuff 09/24/2017   DDD (degenerative disc disease), cervical 10/31/2016   Primary osteoarthritis of both hands 10/31/2016   Primary osteoarthritis of both feet 10/31/2016   High risk medication use 07/06/2016   Rheumatoid arthritis involving multiple sites with positive rheumatoid factor ([place]) 06/02/2016   Contracture, elbow, left 06/02/2016   Osteoarthritis of right knee 08/27/2015   S/P TKR (total knee replacement), left 08/27/2015   Essential hypertension 05/11/2015    CAD (coronary artery disease) 04/02/2012   Sinus bradycardia    Hyperlipidemia    Past Medical History:  Diagnosis Date   AKI (acute kidney injury) ([place]) 05/18/2021   Anemia    Arthritis    "all over"   Bradycardia    Chronic bronchitis ([place])    "get it just about q yr" (03/17/2014)   Daily headache    "here lately" (03/17/2014); relates to sinuses   GERD (gastroesophageal reflux disease)    Hard of hearing    hearing aides bilat   Hiatal hernia    History of blood transfusion 2008   "related to OR"   History of kidney stones    Hyperlipidemia    Pneumonia 1972 X 1   Rheumatoid arthritis ([place])    Type II diabetes mellitus ([place])    Wears glasses    Family History  Problem Relation Age of Onset   Cancer Father        prostate   Past Surgical History:  Procedure Laterality Date   broken finger      surgical repaired left hand 2nd finger    cyst removed      per left knee/posteriorly   CYSTECTOMY Left 2009   "behind knee"  INGUINAL HERNIA REPAIR Right ?2010   JOINT REPLACEMENT     KNEE ARTHROSCOPY Bilateral 1980's   PARTIAL KNEE ARTHROPLASTY Right 08/27/2015   Procedure: RIGHT UNICOMPARTMENTAL KNEE ARTHROPLASTY;  Surgeon: Mcarthur Rossetti, MD;  Location: [facility];  Service: Orthopedics;  Laterality: Right;   REVISION TOTAL KNEE ARTHROPLASTY Left 2008   SHOULDER SURGERY Left 2019   TOTAL KNEE ARTHROPLASTY Left 2003   Social History   Occupational History    Employer: UNEMPLOYED  Tobacco Use   Smoking status: Former    Packs/day: 1.00    Years: 5.00    Total pack years: 5.00    Types: Cigarettes, Cigars    Quit date: 05/15/1968    Years since quitting: 54.1    Passive exposure: Never   Smokeless tobacco: Never  Vaping Use   Vaping Use: Never used  Substance and Sexual Activity   Alcohol use: No    Alcohol/week: 0.0 standard drinks of alcohol   Drug use: Never   Sexual activity: Yes   I spent 34 minutes in the care of the patient Jones including  face-to-face time, preparation to see the patient, as well as review of previous knee x-ray and imaging, review of pertinent orthopedic notes, counseling and educating the patient and family member on role of home exercise plan vs. PT for the above diagnoses.   Elba Barman, DO Primary Care Sports Medicine Physician  [place]  This note was dictated using Dragon naturally speaking software and [place] contain errors in syntax, spelling, or content which have not been identified prior to signing this note.

## 2022-06-20 NOTE — Progress Notes (Signed)
Left leg/calf pain Several months of pain No known injury  Gil injected a few weeks ago, no relief

## 2022-06-21 ENCOUNTER — Ambulatory Visit (HOSPITAL_COMMUNITY)
Admission: RE | Admit: 2022-06-21 | Discharge: 2022-06-21 | Disposition: A | Discharge status: Home / Self Care | Payer: Medicare Other | Source: Ambulatory Visit | Attending: Rheumatology | Admitting: Rheumatology

## 2022-06-21 DIAGNOSIS — Z79899 Other long term (current) drug therapy: Secondary | ICD-10-CM

## 2022-06-21 DIAGNOSIS — M0579 Rheumatoid arthritis with rheumatoid factor of multiple sites without organ or systems involvement: Secondary | ICD-10-CM

## 2022-06-21 MED ORDER — SODIUM CHLORIDE 0.9 % IV SOLN
750.0000 mg | INTRAVENOUS | Status: DC
  Administered 2022-06-21: 750 mg via INTRAVENOUS
  Filled 2022-06-21: qty 30

## 2022-06-21 MED ORDER — ACETAMINOPHEN 325 MG PO TABS: 650.0000 mg | ORAL_TABLET | ORAL | Status: DC

## 2022-06-21 MED ORDER — DIPHENHYDRAMINE HCL 25 MG PO CAPS: 25.0000 mg | ORAL_CAPSULE | ORAL | Status: DC

## 2022-06-26 NOTE — Progress Notes (Unsigned)
Joel Boston Gardella Sr. - 77 y.o. male MRN [id number]  Date of birth: [birth year]  Office Visit Note: Visit Date: 06/27/2022 PCP: Lindell Spar, MD Referred by: Lindell Spar, MD  Subjective: No chief complaint on file.  HPI: Joel Castrejon Tyler Sr. is a pleasant 77 y.o. male who presents today for left lateral leg pain.  During last visit, I did ultrasound the leg/knee and noted tear of the lateral head of the gastrocnemius origin at the lateral femoral condyle.  We did do a trial of extracorporeal shockwave treatment at that visit, he noted ***. Recommend calf compression sleeve. Likely will get him started in HEP vs. PT ***.  Pertinent ROS were reviewed with the patient and found to be negative unless otherwise specified above in HPI.   Assessment & Plan: Visit Diagnoses: No diagnosis found.  Plan: ***  Follow-up: No follow-ups on file.   Meds & Orders: No orders of the defined types were placed in this encounter.  No orders of the defined types were placed in this encounter.    Procedures: Procedure: ECSWT Indications:  Muscle tear, scar tissue   Procedure Details Consent: Risks of procedure as well as the alternatives and risks of each were explained to the patient.  Verbal consent for procedure obtained. Time Out: Verified patient identification, verified procedure, site was marked, verified correct patient position. The area was cleaned with alcohol swab.     The left proximal lateral gastrocnemius tendon/muscle was targeted for Extracorporeal shockwave therapy.    Preset: Status post muscular injury Power Level: 120 mJ *** Frequency: 13-14 Hz Impulse/cycles: 3000 Head size: Regular   Patient tolerated procedure well without immediate complications.      Clinical History: No specialty comments available.  He reports that he quit smoking about 54 years ago. His smoking use included cigarettes and cigars. He has a 5.00 pack-year smoking history. He has never been exposed  to tobacco smoke. He has never used smokeless tobacco.  Recent Labs    08/02/21 0000  HGBA1C 7.2    Objective:   Vital Signs: There were no vitals taken for this visit.  Physical Exam  Gen: Well-appearing, in no acute distress; non-toxic CV: Regular Rate. Well-perfused. Warm.  Resp: Breathing unlabored on room air; no wheezing. Psych: Fluid speech in conversation; appropriate affect; normal thought process Neuro: Sensation intact throughout. No gross coordination deficits.   Ortho Exam - ***  Imaging: No results found.  Past Medical/Family/Surgical/Social History: Medications & Allergies reviewed per EMR, new medications updated. Patient Active Problem List   Diagnosis Date Noted   Allergic sinusitis 02/24/2022   Acute bronchitis 01/24/2022   Primary insomnia 10/06/2021   Gastroesophageal reflux disease 05/18/2021   Type 2 diabetes mellitus with other specified complication ([place]) [id number]   Long term (current) use of insulin ([name]) 05/18/2021   Obesity 05/18/2021   Elevated transaminase level 05/18/2021   Tear of left rotator cuff 09/24/2017   DDD (degenerative disc disease), cervical 10/31/2016   Primary osteoarthritis of both hands 10/31/2016   Primary osteoarthritis of both feet 10/31/2016   High risk medication use 07/06/2016   Rheumatoid arthritis involving multiple sites with positive rheumatoid factor ([name]) 06/02/2016   Contracture, elbow, left 06/02/2016   Osteoarthritis of right knee 08/27/2015   S/P TKR (total knee replacement), left 08/27/2015   Essential hypertension 05/11/2015   CAD (coronary artery disease) 04/02/2012   Sinus bradycardia    Hyperlipidemia    Past Medical History:  Diagnosis Date  AKI (acute kidney injury) ([name]) 05/18/2021   Anemia    Arthritis    "all over"   Bradycardia    Chronic bronchitis ([name])    "get it just about q yr" (03/17/2014)   Daily headache    "here lately" (03/17/2014); relates to sinuses   GERD (gastroesophageal  reflux disease)    Hard of hearing    hearing aides bilat   Hiatal hernia    History of blood transfusion 2008   "related to OR"   History of kidney stones    Hyperlipidemia    Pneumonia 1972 X 1   Rheumatoid arthritis ([name])    Type II diabetes mellitus ([name])    Wears glasses    Family History  Problem Relation Age of Onset   Cancer Father        prostate   Past Surgical History:  Procedure Laterality Date   broken finger      surgical repaired left hand 2nd finger    cyst removed      per left knee/posteriorly   CYSTECTOMY Left 2009   "behind knee"   INGUINAL HERNIA REPAIR Right ?2010   JOINT REPLACEMENT     KNEE ARTHROSCOPY Bilateral 1980's   PARTIAL KNEE ARTHROPLASTY Right 08/27/2015   Procedure: RIGHT UNICOMPARTMENTAL KNEE ARTHROPLASTY;  Surgeon: Mcarthur Rossetti, MD;  Location: [facility];  Service: Orthopedics;  Laterality: Right;   REVISION TOTAL KNEE ARTHROPLASTY Left 2008   SHOULDER SURGERY Left 2019   TOTAL KNEE ARTHROPLASTY Left 2003   Social History   Occupational History    Employer: UNEMPLOYED  Tobacco Use   Smoking status: Former    Packs/day: 1.00    Years: 5.00    Total pack years: 5.00    Types: Cigarettes, Cigars    Quit date: 05/15/1968    Years since quitting: 54.1    Passive exposure: Never   Smokeless tobacco: Never  Vaping Use   Vaping Use: Never used  Substance and Sexual Activity   Alcohol use: No    Alcohol/week: 0.0 standard drinks of alcohol   Drug use: Never   Sexual activity: Yes

## 2022-06-27 ENCOUNTER — Ambulatory Visit (INDEPENDENT_AMBULATORY_CARE_PROVIDER_SITE_OTHER): Payer: Medicare Other | Admitting: Sports Medicine

## 2022-06-27 ENCOUNTER — Encounter: Payer: Self-pay | Admitting: Sports Medicine

## 2022-06-27 ENCOUNTER — Ambulatory Visit (INDEPENDENT_AMBULATORY_CARE_PROVIDER_SITE_OTHER): Payer: Medicare Other | Admitting: Internal Medicine

## 2022-06-27 ENCOUNTER — Encounter: Payer: Self-pay | Admitting: Internal Medicine

## 2022-06-27 VITALS — BP 124/80 | HR 76 | Ht 66.0 in | Wt 207.8 lb

## 2022-06-27 DIAGNOSIS — M79605 Pain in left leg: Secondary | ICD-10-CM

## 2022-06-27 DIAGNOSIS — Z96652 Presence of left artificial knee joint: Secondary | ICD-10-CM

## 2022-06-27 DIAGNOSIS — S86112D Strain of other muscle(s) and tendon(s) of posterior muscle group at lower leg level, left leg, subsequent encounter: Secondary | ICD-10-CM

## 2022-06-27 DIAGNOSIS — Z794 Long term (current) use of insulin: Secondary | ICD-10-CM | POA: Diagnosis not present

## 2022-06-27 DIAGNOSIS — H65112 Acute and subacute allergic otitis media (mucoid) (sanguinous) (serous), left ear: Secondary | ICD-10-CM

## 2022-06-27 DIAGNOSIS — E1169 Type 2 diabetes mellitus with other specified complication: Secondary | ICD-10-CM

## 2022-06-27 DIAGNOSIS — I10 Essential (primary) hypertension: Secondary | ICD-10-CM

## 2022-06-27 DIAGNOSIS — J4489 Other specified chronic obstructive pulmonary disease: Secondary | ICD-10-CM

## 2022-06-27 DIAGNOSIS — H65192 Other acute nonsuppurative otitis media, left ear: Secondary | ICD-10-CM

## 2022-06-27 LAB — POCT GLYCOSYLATED HEMOGLOBIN (HGB A1C): HbA1c, POC (controlled diabetic range): 8.1 % — AB (ref 0.0–7.0)

## 2022-06-27 MED ORDER — OFLOXACIN 0.3 % OT SOLN: 5.0000 [drp] | Freq: Every day | OTIC | 0 refills | Status: DC

## 2022-06-27 NOTE — Progress Notes (Signed)
Doing better; he states the first shockwave did help He is also doing a compression sock which he states has helped   Patient was instructed in 10 minutes of therapeutic exercises for left calf pain to improve strength, ROM and function according to my instructions and plan of care by a Certified Athletic Trainer during the office visit. A customized handout was provided and demonstration of proper technique shown and discussed. Patient did perform exercises and demonstrate understanding through teachback.  All questions discussed and answered.

## 2022-06-27 NOTE — Assessment & Plan Note (Signed)
Started ofloxacin otic drops He has recurrent ear infections and chronic sinusitis, referred to ENT specialist

## 2022-06-27 NOTE — Progress Notes (Addendum)
 Established Patient Office Visit  Subjective:  Patient ID: Joel Marciano Erskin Sr., male    DOB: [birth year]  Age: 77 y.o. MRN: [id number]  CC:  Chief Complaint  Patient presents with   Ear Pain    Left ear pain along with coughing clear phlegm     HPI Joel Solly Challis Sr. is a 77 y.o. male with past medical history of HTN, type II DM with HLD, RA, OA and obesity who presents for f/u of his chronic medical conditions.  He complains of left ear pain for the last 1 week. He currently denies any ear discharge.  Denies any fever or chills.  He has chronic sinusitis. He has had ear infections in the past.  He was referred to ENT specialist, but was not able to see them.  He has had recurrent bronchitis.  He currently complains of cough with clear expectoration.  He denies any hemoptysis currently.  He has had recurrent antibiotics for sinusitis recently.  He also had influenza A in the last month.  He has tear of left gastrocnemius muscle and has been getting extracorporeal shockwave treatment. He has noticed improvement in the pain over left leg.  HTN: BP is well-controlled. Takes medications regularly. Patient denies headache, dizziness, chest pain, dyspnea or palpitations.   Type II DM with HLD: Followed by Dr. Faythe at [facility].  He has an insulin  pump. His last HbA1C was 8.1 today.  He reports that he had run out of test trips and had to guess blood glucose for insulin  dosage.  He has test strips now and is taking insulin  according to his recommended regimen. He denies any fatigue, polyuria or polydipsia currently.  He is on statin for HLD.     Past Medical History:  Diagnosis Date   AKI (acute kidney injury) ([facility]) 05/18/2021   Anemia    Arthritis    all over   Bradycardia    Chronic bronchitis ([facility])    get it just about q yr (03/17/2014)   Daily headache    here lately (03/17/2014); relates to sinuses   GERD (gastroesophageal reflux disease)    Hard of hearing    hearing aides  bilat   Hiatal hernia    History of blood transfusion 2008   related to OR   History of kidney stones    Hyperlipidemia    Pneumonia 1972 X 1   Rheumatoid arthritis ([facility])    Type II diabetes mellitus ([facility])    Wears glasses     Past Surgical History:  Procedure Laterality Date   broken finger      surgical repaired left hand 2nd finger    cyst removed      per left knee/posteriorly   CYSTECTOMY Left 2009   behind knee   INGUINAL HERNIA REPAIR Right ?2010   JOINT REPLACEMENT     KNEE ARTHROSCOPY Bilateral 1980's   PARTIAL KNEE ARTHROPLASTY Right 08/27/2015   Procedure: RIGHT UNICOMPARTMENTAL KNEE ARTHROPLASTY;  Surgeon: Lonni CINDERELLA Poli, MD;  Location: [facility];  Service: Orthopedics;  Laterality: Right;   REVISION TOTAL KNEE ARTHROPLASTY Left 2008   SHOULDER SURGERY Left 2019   TOTAL KNEE ARTHROPLASTY Left 2003    Family History  Problem Relation Age of Onset   Cancer Father        prostate    Social History   Socioeconomic History   Marital status: Married    Spouse name: [name]   Number of children: 3   Years  of education: College   Highest education level: Not on file  Occupational History    Employer: UNEMPLOYED  Tobacco Use   Smoking status: Former    Packs/day: 1.00    Years: 5.00    Total pack years: 5.00    Types: Cigarettes, Cigars    Quit date: 05/15/1968    Years since quitting: 54.1    Passive exposure: Never   Smokeless tobacco: Never  Vaping Use   Vaping Use: Never used  Substance and Sexual Activity   Alcohol use: No    Alcohol/week: 0.0 standard drinks of alcohol   Drug use: Never   Sexual activity: Yes  Other Topics Concern   Not on file  Social History Narrative   Patient lives at home with family.   Caffeine Use: occasionally   Social Determinants of Health   Financial Resource Strain: Not on file  Food Insecurity: Not on file  Transportation Needs: Not on file  Physical Activity: Not on file  Stress: Not on file   Social Connections: Not on file  Intimate Partner Violence: Not on file    Outpatient Medications Prior to Visit  Medication Sig Dispense Refill   Abatacept  (ORENCIA  IV) Inject 750 mg into the vein every 28 (twenty-eight) days.     albuterol  (VENTOLIN  HFA) 108 (90 Base) MCG/ACT inhaler Inhale 2 puffs into the lungs every 6 (six) hours as needed for wheezing or shortness of breath. 8 g 0   amLODipine  (NORVASC ) 5 MG tablet Take 1 tablet (5 mg total) by mouth daily. 90 tablet 3   Ascorbic Acid  (VITAMIN C  PO) Take by mouth daily.     Ascorbic Acid  (VITAMIN C ) 500 MG CHEW 1 tablet Orally daily     aspirin  EC 81 MG tablet Take 81 mg by mouth daily.     Calcium Carbonate-Vit D-Min (CALCIUM 1200 PO) Take by mouth daily.     cetirizine  (ZYRTEC ) 10 MG tablet Take 1 tablet (10 mg total) by mouth at bedtime. 30 tablet 3   Cholecalciferol (VITAMIN D3 PO) Take by mouth daily.     Continuous Blood Gluc Sensor (FREESTYLE LIBRE 2 SENSOR) MISC . SABRA change sensor every 14 days on insulin  pump for 90 days     empagliflozin (JARDIANCE) 10 MG TABS tablet 1 tablet Orally Once a day     fluticasone  (FLONASE ) 50 MCG/ACT nasal spray Place 2 sprays into both nostrils daily. 16 g 6   HUMALOG  100 UNIT/ML injection INJ UP TO 65 UNI [place] VIA INSULIN  PUMP QD     HYDROcodone -acetaminophen  (NORCO) 5-325 MG tablet Take 1 tablet by mouth every 6 (six) hours as needed for moderate pain. 12 tablet 0   hydrOXYzine  (VISTARIL ) 25 MG capsule Take 1 capsule (25 mg total) by mouth at bedtime as needed (Insomnia). 30 capsule 5   insulin  aspart (NOVOLOG ) 100 UNIT/ML FlexPen Inject 1 Units into the skin 3 (three) times daily with meals. 1 unit per hour     insulin  glargine (LANTUS ) 100 UNIT/ML injection 16 units Subcutaneous once a day if unable to use pump for 90 days     KRILL OIL 1000 MG CAPS Take 1,000 mg by mouth daily.     Lancets (ONETOUCH ULTRASOFT) lancets FOR USE WHEN CHECKING BLOOD SUGAR QID.  3   loratadine  (CLARITIN ) 10 MG  tablet 1 tablet Orally Once a day     Misc Natural Products (TART CHERRY ADVANCED PO) Take by mouth daily.     Multiple Vitamin (MULTIVITAMIN WITH  MINERALS) TABS Take 1 tablet by mouth daily.     Multiple Vitamins-Minerals (ZINC PO) Take by mouth daily.     ONE TOUCH ULTRA TEST test strip USE WHEN CHECKING BLOOD SUGAR QID.  3   pantoprazole  (PROTONIX ) 40 MG tablet TAKE 1 TABLET DAILY. NEED OFFICE VISIT AND LABS BEFORE FURTHER REFILLS 90 tablet 0   potassium chloride  (KLOR-CON ) 10 MEQ tablet Take by mouth.     POTASSIUM PO Take by mouth daily.     pravastatin  (PRAVACHOL ) 40 MG tablet Take 1 tablet (40 mg total) by mouth every evening. 90 tablet 3   promethazine -dextromethorphan (PROMETHAZINE -DM) 6.25-15 MG/5ML syrup Take 5 mLs by mouth 4 (four) times daily as needed for cough. 118 mL 0   Pyridoxine HCl (VITAMIN B-6 PO) Take by mouth daily.     ramipril  (ALTACE ) 5 MG capsule Take 1 capsule (5 mg total) by mouth daily. 90 capsule 3   tizanidine  (ZANAFLEX ) 2 MG capsule Take 1 capsule (2 mg total) by mouth 3 (three) times daily. 40 capsule 1   vitamin E  400 UNIT capsule Take 400 Units by mouth daily.     ofloxacin  (OCUFLOX ) 0.3 % ophthalmic solution Place 1 drop into the left eye 4 (four) times daily. 5 mL 0   oseltamivir  (TAMIFLU ) 75 MG capsule Take 1 capsule (75 mg total) by mouth 2 (two) times daily. 10 capsule 0   No facility-administered medications prior to visit.    Allergies  Allergen Reactions   Codeine Swelling   Lipitor [Atorvastatin] Other (See Comments)    Muscle aches    Invokana [Canagliflozin] Other (See Comments)    Stomach upset    Losartan Nausea And Vomiting   Roxicodone  [Oxycodone ] Itching   Vicodin [Hydrocodone -Acetaminophen ] Itching    ROS Review of Systems  Constitutional:  Negative for chills and fever.  HENT:  Negative for congestion and sore throat.   Eyes:  Negative for pain and discharge.  Respiratory:  Positive for cough. Negative for shortness of breath.    Cardiovascular:  Negative for chest pain and palpitations.  Gastrointestinal:  Negative for diarrhea, nausea and vomiting.  Endocrine: Negative for polydipsia and polyuria.  Genitourinary:  Negative for dysuria and hematuria.  Musculoskeletal:  Positive for arthralgias and back pain. Negative for neck pain and neck stiffness.  Skin:  Negative for rash.  Neurological:  Negative for dizziness, weakness, numbness and headaches.  Psychiatric/Behavioral:  Positive for sleep disturbance. Negative for agitation and behavioral problems.       Objective:    Physical Exam Vitals reviewed.  Constitutional:      General: He is not in acute distress.    Appearance: He is obese. He is not diaphoretic.  HENT:     Head: Normocephalic and atraumatic.     Left Ear: No tenderness. A middle ear effusion is present.     Nose: Congestion present.     Mouth/Throat:     Mouth: Mucous membranes are moist.  Eyes:     General: No scleral icterus.    Extraocular Movements: Extraocular movements intact.  Cardiovascular:     Rate and Rhythm: Normal rate and regular rhythm.     Pulses: Normal pulses.     Heart sounds: Normal heart sounds. No murmur heard. Pulmonary:     Breath sounds: Normal breath sounds. No wheezing or rales.  Musculoskeletal:     Cervical back: Neck supple. No tenderness.     Right lower leg: No edema.     Left lower  leg: No edema.  Skin:    Comments: Cyst over upper back  Neurological:     General: No focal deficit present.     Mental Status: He is alert and oriented to person, place, and time.  Psychiatric:        Mood and Affect: Mood normal.        Behavior: Behavior normal.     BP 124/80 (BP Location: Right Arm, Patient Position: Sitting, Cuff Size: Large)   Pulse 76   Ht 5' 6 (1.676 m)   Wt 207 lb 12.8 oz (94.3 kg)   SpO2 91%   BMI 33.54 kg/m  Wt Readings from Last 3 Encounters:  06/27/22 207 lb 12.8 oz (94.3 kg)  05/31/22 207 lb 3.2 oz (94 kg)  05/24/22 210  lb (95.3 kg)    Lab Results  Component Value Date   TSH 3.03 05/24/2016   Lab Results  Component Value Date   WBC 6.3 05/24/2022   HGB 13.9 05/24/2022   HCT 41.6 05/24/2022   MCV 92.2 05/24/2022   PLT 250 05/24/2022   Lab Results  Component Value Date   NA 136 05/24/2022   K 4.3 05/24/2022   CO2 27 05/24/2022   GLUCOSE 220 (H) 05/24/2022   BUN 21 05/24/2022   CREATININE 1.24 05/24/2022   BILITOT 0.9 05/24/2022   ALKPHOS 91 05/24/2022   AST 32 05/24/2022   ALT 31 05/24/2022   PROT 6.1 (L) 05/24/2022   ALBUMIN 3.7 05/24/2022   CALCIUM 8.6 (L) 05/24/2022   ANIONGAP 8 05/24/2022   EGFR 67 05/23/2021   Lab Results  Component Value Date   CHOL 120 05/20/2019   Lab Results  Component Value Date   HDL 41 05/20/2019   Lab Results  Component Value Date   LDLCALC 69 05/20/2019   Lab Results  Component Value Date   TRIG 41 05/20/2019   Lab Results  Component Value Date   CHOLHDL 2.9 05/20/2019   Lab Results  Component Value Date   HGBA1C 8.1 (A) 06/27/2022      Assessment & Plan:   Problem List Items Addressed This Visit       Cardiovascular and Mediastinum   Essential hypertension - Primary    BP Readings from Last 1 Encounters:  06/27/22 124/80  Well-controlled with amlodipine  and Ramipril  Counseled for compliance with the medications Advised DASH diet and moderate exercise/walking as tolerated        Respiratory   Asthmatic bronchitis , chronic    Has chronic cough Avoided steroid due to his type 2 DM Albuterol  PRN for dyspnea or wheezing Would add ICS if persistent symptoms Promethazine -DM for cough        Endocrine   Type 2 diabetes mellitus with other specified complication ([facility])    Lab Results  Component Value Date   HGBA1C 7.2 08/02/2021  Well-controlled Associated with CAD, HTN and HLD Has insulin  pump, followed by Dr. Faythe with [facility] Advised to follow diabetic diet On statin and ACEi F/u CMP and lipid profile Diabetic  eye exam: Advised to follow up with Ophthalmology for diabetic eye exam      Relevant Orders   POCT glycosylated hemoglobin (Hb A1C) (Completed)     Nervous and Auditory   Acute mucoid otitis media of left ear    Started ofloxacin  otic drops He has recurrent ear infections and chronic sinusitis, referred to ENT specialist      Relevant Medications   ofloxacin  (FLOXIN ) 0.3 % OTIC  solution   Other Relevant Orders   Ambulatory referral to ENT     Musculoskeletal and Integument   Gastrocnemius muscle tear, left, subsequent encounter    Gets extracorporeal shockwave treatment Pain improved now       Meds ordered this encounter  Medications   ofloxacin  (FLOXIN ) 0.3 % OTIC solution    Sig: Place 5 drops into the left ear daily.    Dispense:  5 mL    Refill:  0    Follow-up: Return in about 4 months (around 10/26/2022) for DM and HTN.    Suzzane MARLA Blanch, MD

## 2022-06-27 NOTE — Assessment & Plan Note (Signed)
Gets extracorporeal shockwave treatment Pain improved now

## 2022-06-27 NOTE — Assessment & Plan Note (Signed)
Has chronic cough Avoided steroid due to his type 2 DM Albuterol PRN for dyspnea or wheezing Would add ICS if persistent symptoms Promethazine-DM for cough

## 2022-06-27 NOTE — Assessment & Plan Note (Signed)
BP Readings from Last 1 Encounters:  06/27/22 124/80   Well-controlled with amlodipine and Ramipril Counseled for compliance with the medications Advised DASH diet and moderate exercise/walking as tolerated

## 2022-06-27 NOTE — Patient Instructions (Addendum)
Please apply Ofloxacin drops for ear infection.  Please continue taking other medications as prescribed.  Please continue to follow low carb diet and ambulate as tolerated.

## 2022-06-27 NOTE — Assessment & Plan Note (Addendum)
Lab Results  Component Value Date   HGBA1C 8.1 (A) 06/27/2022   Uncontrolled Associated with CAD, HTN and HLD Has insulin pump, followed by Dr. Buddy Duty with [facility] - needs to check blood glucose regularly and take insulin as recommended regimen Advised to follow diabetic diet On statin and ACEi F/u CMP and lipid profile Diabetic eye exam: Advised to follow up with Ophthalmology for diabetic eye exam

## 2022-07-04 ENCOUNTER — Ambulatory Visit (INDEPENDENT_AMBULATORY_CARE_PROVIDER_SITE_OTHER): Payer: Medicare Other | Admitting: Sports Medicine

## 2022-07-04 ENCOUNTER — Encounter: Payer: Self-pay | Admitting: Sports Medicine

## 2022-07-04 DIAGNOSIS — S86112D Strain of other muscle(s) and tendon(s) of posterior muscle group at lower leg level, left leg, subsequent encounter: Secondary | ICD-10-CM | POA: Diagnosis not present

## 2022-07-04 DIAGNOSIS — Z96652 Presence of left artificial knee joint: Secondary | ICD-10-CM | POA: Diagnosis not present

## 2022-07-04 DIAGNOSIS — M79605 Pain in left leg: Secondary | ICD-10-CM

## 2022-07-04 NOTE — Progress Notes (Signed)
States he is doing better

## 2022-07-04 NOTE — Progress Notes (Signed)
Joel Boston Stempel Sr. - 77 y.o. male MRN [id number]  Date of birth: 06-14-1945  Office Visit Note: Visit Date: 07/04/2022 PCP: Joel Spar, MD Referred by: Joel Spar, MD  Subjective: Chief Complaint  Patient presents with   Left Leg - Follow-up   HPI: Joel Todorovich Sandall Sr. is a pleasant 77 y.o. male who presents today for follow-up of left lateral leg pain with gastrocnemius tear.  And states he continues to get excellent improvement from the extracorporeal shockwave treatments.  At this time, he feels like he is about 90 to 95% improved.  Continues to do the home rehab exercises daily.  Also going up and down his steps at his home without difficulty.  Continues in the compression sock.  Pertinent ROS were reviewed with the patient and found to be negative unless otherwise specified above in HPI.   Assessment & Plan: Visit Diagnoses:  1. Gastrocnemius muscle tear, left, subsequent encounter   2. Pain in left leg   3. S/P TKR (total knee replacement), left    Plan: Both [name] and I are very pleased with the progress he has made.  Feels like he is 90-95% improved.  We did repeat extracorporeal shockwave treatment today.  We will space these out and I will see him back in 2 weeks for hopefully a final treatment and then likely discontinue treatment at that time.  He will continue home rehab exercises as well as his tall leg compression sleeve.  Eventually we will get him back to Dr. Ninfa Jones to evaluate for his knees.  Follow-up: Return in about 2 weeks (around 07/18/2022) for left gastroc.   Meds & Orders: No orders of the defined types were placed in this encounter.  No orders of the defined types were placed in this encounter.    Procedures: Procedure: ECSWT Indications:  Muscle tear, scar tissue   Procedure Details Consent: Risks of procedure as well as the alternatives and risks of each were explained to the patient.  Verbal consent for procedure obtained. Time Out: Verified  patient identification, verified procedure, site was marked, verified correct patient position. The area was cleaned with alcohol swab.     The left proximal lateral gastrocnemius tendon/muscle was targeted for Extracorporeal shockwave therapy.    Preset: Status post muscular injury Power Level: 120 mJ  Frequency: 14 Hz Impulse/cycles: 2800 Head size: Regular   Patient tolerated procedure well without immediate complications.       Clinical History: No specialty comments available.  He reports that he quit smoking about 54 years ago. His smoking use included cigarettes and cigars. He has a 5.00 pack-year smoking history. He has never been exposed to tobacco smoke. He has never used smokeless tobacco.  Recent Labs    08/02/21 0000 06/27/22 1450  HGBA1C 7.2 8.1*    Objective:    Physical Exam  Gen: Well-appearing, in no acute distress; non-toxic CV: Well-perfused. Warm.  Resp: Breathing unlabored on room air; no wheezing. Psych: Fluid speech in conversation; appropriate affect; normal thought process Neuro: Sensation intact throughout. No gross coordination deficits.   Ortho Exam - Left leg: There is a well-healed incision from prior TKA.  There is no longer any tenderness to palpation over the lateral head of the gastrocnemius tendon.  No overlying swelling or redness.  Full range of motion of the calf and ankle in all directions.  Imaging: No results found.  Past Medical/Family/Surgical/Social History: Medications & Allergies reviewed per EMR, new medications updated. Patient  Active Problem List   Diagnosis Date Noted   Acute mucoid otitis media of left ear 06/27/2022   Gastrocnemius muscle tear, left, subsequent encounter 06/27/2022   Allergic sinusitis 02/24/2022   Asthmatic bronchitis , chronic 01/24/2022   Primary insomnia 10/06/2021   Gastroesophageal reflux disease 05/18/2021   Type 2 diabetes mellitus with other specified complication ([place]) [id number]   Long  term (current) use of insulin ([place]) 05/18/2021   Obesity 05/18/2021   Elevated transaminase level 05/18/2021   Tear of left rotator cuff 09/24/2017   DDD (degenerative disc disease), cervical 10/31/2016   Primary osteoarthritis of both hands 10/31/2016   Primary osteoarthritis of both feet 10/31/2016   High risk medication use 07/06/2016   Rheumatoid arthritis involving multiple sites with positive rheumatoid factor ([place]) 06/02/2016   Contracture, elbow, left 06/02/2016   Osteoarthritis of right knee 08/27/2015   S/P TKR (total knee replacement), left 08/27/2015   Essential hypertension 05/11/2015   CAD (coronary artery disease) 04/02/2012   Sinus bradycardia    Hyperlipidemia    Past Medical History:  Diagnosis Date   AKI (acute kidney injury) ([place]) 05/18/2021   Anemia    Arthritis    "all over"   Bradycardia    Chronic bronchitis ([place])    "get it just about q yr" (03/17/2014)   Daily headache    "here lately" (03/17/2014); relates to sinuses   GERD (gastroesophageal reflux disease)    Hard of hearing    hearing aides bilat   Hiatal hernia    History of blood transfusion 2008   "related to OR"   History of kidney stones    Hyperlipidemia    Pneumonia 1972 X 1   Rheumatoid arthritis ([place])    Type II diabetes mellitus ([place])    Wears glasses    Family History  Problem Relation Age of Onset   Cancer Father        prostate   Past Surgical History:  Procedure Laterality Date   broken finger      surgical repaired left hand 2nd finger    cyst removed      per left knee/posteriorly   CYSTECTOMY Left 2009   "behind knee"   INGUINAL HERNIA REPAIR Right ?2010   JOINT REPLACEMENT     KNEE ARTHROSCOPY Bilateral 1980's   PARTIAL KNEE ARTHROPLASTY Right 08/27/2015   Procedure: RIGHT UNICOMPARTMENTAL KNEE ARTHROPLASTY;  Surgeon: Mcarthur Rossetti, MD;  Location: [facility];  Service: Orthopedics;  Laterality: Right;   REVISION TOTAL KNEE ARTHROPLASTY Left 2008   SHOULDER  SURGERY Left 2019   TOTAL KNEE ARTHROPLASTY Left 2003   Social History   Occupational History    Employer: UNEMPLOYED  Tobacco Use   Smoking status: Former    Packs/day: 1.00    Years: 5.00    Total pack years: 5.00    Types: Cigarettes, Cigars    Quit date: 05/15/1968    Years since quitting: 54.1    Passive exposure: Never   Smokeless tobacco: Never  Vaping Use   Vaping Use: Never used  Substance and Sexual Activity   Alcohol use: No    Alcohol/week: 0.0 standard drinks of alcohol   Drug use: Never   Sexual activity: Yes

## 2022-07-05 NOTE — Progress Notes (Signed)
Office Visit Note  Patient: Joel Sieck Wiswell Sr.             Date of Birth: [birth year]           MRN: [id number]             PCP: Lindell Spar, MD Referring: Lindell Spar, MD Visit Date: 07/19/2022 Occupation: '@GUAROCC'$ @  Subjective:  Medication management  History of Present Illness: Joel Seminario Muratalla Sr. is a 77 y.o. male with history of seropositive rheumatoid arthritis, osteoarthritis and degenerative disc disease.  He states he continues to have some stiffness just prior to her next Orencia infusion.  And then the symptoms improved.  He had Orencia infusion this morning.  He has been on Orencia IV 750 mg every 28 days which she has been tolerating well.  He denies having any recent infections or interruption in the treatment.  He states the pain and stiffness is mostly in his hands.  He continues to have pain and discomfort in his knee joints and has been followed by Dr. Ninfa Linden.  He states he will require total knee replacement on his right knee joint.  He also has some lower back pain and has been followed by Dr. Rolena Infante.    Activities of Daily Living:  Patient reports morning stiffness for 10 minutes.   Patient Reports nocturnal pain.  Difficulty dressing/grooming: Denies Difficulty climbing stairs: Denies Difficulty getting out of chair: Denies Difficulty using hands for taps, buttons, cutlery, and/or writing: Denies  Review of Systems  Constitutional:  Positive for fatigue.  HENT:  Negative for mouth sores and mouth dryness.   Eyes:  Negative for dryness.  Respiratory:  Negative for shortness of breath.   Cardiovascular:  Negative for chest pain and palpitations.  Gastrointestinal:  Negative for constipation and diarrhea.  Endocrine: Negative for increased urination.  Genitourinary:  Negative for decreased urine output.  Musculoskeletal:  Positive for joint pain, gait problem, joint pain and muscle tenderness. Negative for myalgias and myalgias.  Skin:  Negative for color  change, rash and sensitivity to sunlight.  Allergic/Immunologic: Negative for susceptible to infections.  Neurological:  Negative for headaches.  Hematological:  Negative for swollen glands.  Psychiatric/Behavioral:  Positive for sleep disturbance. Negative for depressed mood. The patient is not nervous/anxious.     PMFS History:  Patient Active Problem List   Diagnosis Date Noted   Acute mucoid otitis media of left ear 06/27/2022   Gastrocnemius muscle tear, left, subsequent encounter 06/27/2022   Allergic sinusitis 02/24/2022   Asthmatic bronchitis , chronic 01/24/2022   Primary insomnia 10/06/2021   Gastroesophageal reflux disease 05/18/2021   Type 2 diabetes mellitus with other specified complication ([place]) [id number]   Long term (current) use of insulin ([place]) 05/18/2021   Obesity 05/18/2021   Elevated transaminase level 05/18/2021   Tear of left rotator cuff 09/24/2017   DDD (degenerative disc disease), cervical 10/31/2016   Primary osteoarthritis of both hands 10/31/2016   Primary osteoarthritis of both feet 10/31/2016   High risk medication use 07/06/2016   Rheumatoid arthritis involving multiple sites with positive rheumatoid factor ([place]) 06/02/2016   Contracture, elbow, left 06/02/2016   Osteoarthritis of right knee 08/27/2015   S/P TKR (total knee replacement), left 08/27/2015   Essential hypertension 05/11/2015   CAD (coronary artery disease) 04/02/2012   Sinus bradycardia    Hyperlipidemia     Past Medical History:  Diagnosis Date   AKI (acute kidney injury) ([place]) 05/18/2021   Anemia  Arthritis    "all over"   Bradycardia    Chronic bronchitis ([place])    "get it just about q yr" (03/17/2014)   Daily headache    "here lately" (03/17/2014); relates to sinuses   GERD (gastroesophageal reflux disease)    Hard of hearing    hearing aides bilat   Hiatal hernia    History of blood transfusion 2008   "related to OR"   History of kidney stones    Hyperlipidemia     Pneumonia 1972 X 1   Rheumatoid arthritis ([place])    Type II diabetes mellitus ([place])    Wears glasses     Family History  Problem Relation Age of Onset   Cancer Father        prostate   Past Surgical History:  Procedure Laterality Date   broken finger      surgical repaired left hand 2nd finger    cyst removed      per left knee/posteriorly   CYSTECTOMY Left 2009   "behind knee"   INGUINAL HERNIA REPAIR Right ?2010   JOINT REPLACEMENT     KNEE ARTHROSCOPY Bilateral 1980's   PARTIAL KNEE ARTHROPLASTY Right 08/27/2015   Procedure: RIGHT UNICOMPARTMENTAL KNEE ARTHROPLASTY;  Surgeon: Mcarthur Rossetti, MD;  Location: [facility];  Service: Orthopedics;  Laterality: Right;   REVISION TOTAL KNEE ARTHROPLASTY Left 2008   SHOULDER SURGERY Left 2019   TOTAL KNEE ARTHROPLASTY Left 2003   Social History   Social History Narrative   Patient lives at home with family.   Caffeine Use: occasionally   Immunization History  Administered Date(s) Administered   Pneumococcal Conjugate-13 01/13/2014   Pneumococcal Polysaccharide-23 05/15/2006, 09/16/2012, 03/29/2018     Objective: Vital Signs: BP 121/75 (BP Location: Left Arm, Patient Position: Sitting, Cuff Size: Large)   Pulse 89   Ht '5\' 6"'$  (1.676 m)   Wt 209 lb (94.8 kg)   BMI 33.73 kg/m    Physical Exam Vitals and nursing note reviewed.  Constitutional:      Appearance: He is well-developed.  HENT:     Head: Normocephalic and atraumatic.  Eyes:     Conjunctiva/sclera: Conjunctivae normal.     Pupils: Pupils are equal, round, and reactive to light.  Cardiovascular:     Rate and Rhythm: Normal rate and regular rhythm.     Heart sounds: Normal heart sounds.  Pulmonary:     Effort: Pulmonary effort is normal.     Breath sounds: Normal breath sounds.  Abdominal:     General: Bowel sounds are normal.     Palpations: Abdomen is soft.  Musculoskeletal:     Cervical back: Normal range of motion and neck supple.  Skin:     General: Skin is warm and dry.     Capillary Refill: Capillary refill takes less than 2 seconds.  Neurological:     Mental Status: He is alert and oriented to person, place, and time.  Psychiatric:        Behavior: Behavior normal.      Musculoskeletal Exam: He had limited lateral rotation of the cervical spine without discomfort.  Some thoracic kyphosis was noted.  There was no tenderness over lumbar region.  Shoulder joints, elbow joints, wrist joints were in good range of motion.  He had bilateral PIP and DIP thickening with difficulty making a fist due to severe osteoarthritis.  Hip joints were in good range of motion.  He had right knee joint partial replacement the left knee  joint total replacement.  He has discomfort range of motion of his knee joints.  There was no tenderness over ankles or MTPs.  CDAI Exam: CDAI Score: -- Patient Global: 3 mm; Provider Global: 3 mm Swollen: --; Tender: -- Joint Exam 07/19/2022   No joint exam has been documented for this visit   There is currently no information documented on the homunculus. Go to the Rheumatology activity and complete the homunculus joint exam.  Investigation: No additional findings.  Imaging: [place] Extrem Low Left Ltd  Result Date: 06/20/2022 Limited MSK ultrasound of the left lower extremity was performed. Evaluation of the lateral knee joint shows overlying lateral quadricep musculature within abnormality. There is hypoechoic fluid and evidence of high-grade tearing of the proximal lateral head of the gastrocnemius tendon near the insertion on the lateral condyle of the femur. Notable hyperemia in this region. There is a small amount of hypoechoic fluid within the posterior knee joint as well. Scanning distally in both short and long-axis there is evidence of tearing and hyperemia of the proximal lateral head of the gastrocnemius tendon and muscle belly, associated pain with sonopalpation here. MHG and overlying soleus appears  intact.    Recent Labs: Lab Results  Component Value Date   WBC 5.6 07/19/2022   HGB 14.9 07/19/2022   PLT 251 07/19/2022   NA 135 07/19/2022   K 3.9 07/19/2022   CL 102 07/19/2022   CO2 25 07/19/2022   GLUCOSE 206 (H) 07/19/2022   BUN 21 07/19/2022   CREATININE 1.07 07/19/2022   BILITOT 1.0 07/19/2022   ALKPHOS 94 07/19/2022   AST 25 07/19/2022   ALT 28 07/19/2022   PROT 6.6 07/19/2022   ALBUMIN 3.8 07/19/2022   CALCIUM 8.8 (L) 07/19/2022   GFRAA >60 01/26/2020   QFTBGOLDPLUS Negative 07/25/2021    Speciality Comments: Orencia '750mg'$  every 4 weeks TB gold negative on Jan 2019  Procedures:  No procedures performed Allergies: Codeine, Lipitor [atorvastatin], Invokana [canagliflozin], Losartan, Roxicodone [oxycodone], and Vicodin [hydrocodone-acetaminophen]   Assessment / Plan:     Visit Diagnoses: Rheumatoid arthritis involving multiple sites with positive rheumatoid factor (HCC)-he has been tolerating Orencia IV 750 mg every 28 days without any side effects.  His last infusion was this morning which he tolerated well.  He continues to have some stiffness just prior to his infusion.  He denies any interruption in the treatment.  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  He has not had any episodes of joint swelling.  High risk medication use - IV Orencia 750 mg infusions every 28 days.  Was obtained on July 19, 2022 CBC and CMP were stable except calcium was low at 8.8.  TB Gold results are pending which was drawn today.  Lab results were reviewed with the patient.  Information on immunization was placed in the AVS.  He was also advised to hold Orencia if he develops an infection and resume after the infection resolves.  Patient [place] have right total knee replacement.  Advised him to stop Orencia 1 month prior to the surgery and [place] resume 2 weeks after the surgery if he has no  infection and he has clearance by the surgeon.  Primary osteoarthritis of both hands-he has severe osteoarthritis in his bilateral hands with DIP and PIP thickening and incomplete fist formation.  He had no synovitis on the examination today.  Status post right  partial knee replacement - Followed by Dr. Ninfa Linden.  Patient [place] get total knee replacement in the near future.  S/P TKR (total knee replacement), left - x2 by Dr. Telford Nab.  Doing well.  He has intermittent discomfort.  Primary osteoarthritis of both feet-proper fitting shoes were advised.  DDD (degenerative disc disease), cervical-he had limited lateral rotation without any radiculopathy.  DDD (degenerative disc disease), lumbar - Followed by Dr. Rolena Infante at [facility].  He has chronic lower back discomfort.  Other fatigue-related to insomnia.  Chronic insomnia-good sleep hygiene was.  Other medical problems are listed as follows:  History of diabetes mellitus  History of hypertension  History of hyperlipidemia  History of coronary artery disease  Osteoporosis screening-he is 77 years old.  Will schedule DEXA scan to screen for osteoporosis.  Orders: No orders of the defined types were placed in this encounter.  No orders of the defined types were placed in this encounter.   Follow-Up Instructions: Return in about 5 months (around 12/19/2022) for Rheumatoid arthritis.   Bo Merino, MD  Note - This record has been created using Editor, commissioning.  Chart creation errors have been sought, but Wenk not always  have been located. Such creation errors do not reflect on  the standard of medical care.

## 2022-07-10 ENCOUNTER — Telehealth: Payer: Self-pay | Admitting: Internal Medicine

## 2022-07-10 NOTE — Telephone Encounter (Signed)
Pt wife called stating she received a message with a number that the dr needed to call in regards to the ENT @ [facility]     Ph # [phone]

## 2022-07-11 NOTE — Telephone Encounter (Signed)
Tried calling ent at [facility], no one to come to the phone

## 2022-07-12 DIAGNOSIS — L821 Other seborrheic keratosis: Secondary | ICD-10-CM | POA: Diagnosis not present

## 2022-07-12 DIAGNOSIS — L820 Inflamed seborrheic keratosis: Secondary | ICD-10-CM | POA: Diagnosis not present

## 2022-07-12 DIAGNOSIS — L728 Other follicular cysts of the skin and subcutaneous tissue: Secondary | ICD-10-CM | POA: Diagnosis not present

## 2022-07-12 DIAGNOSIS — L538 Other specified erythematous conditions: Secondary | ICD-10-CM | POA: Diagnosis not present

## 2022-07-12 DIAGNOSIS — Z789 Other specified health status: Secondary | ICD-10-CM | POA: Diagnosis not present

## 2022-07-12 DIAGNOSIS — L298 Other pruritus: Secondary | ICD-10-CM | POA: Diagnosis not present

## 2022-07-12 DIAGNOSIS — D225 Melanocytic nevi of trunk: Secondary | ICD-10-CM | POA: Diagnosis not present

## 2022-07-12 DIAGNOSIS — L814 Other melanin hyperpigmentation: Secondary | ICD-10-CM | POA: Diagnosis not present

## 2022-07-18 ENCOUNTER — Encounter: Payer: Self-pay | Admitting: Sports Medicine

## 2022-07-18 ENCOUNTER — Ambulatory Visit (INDEPENDENT_AMBULATORY_CARE_PROVIDER_SITE_OTHER): Payer: Medicare Other | Admitting: Sports Medicine

## 2022-07-18 DIAGNOSIS — S86112D Strain of other muscle(s) and tendon(s) of posterior muscle group at lower leg level, left leg, subsequent encounter: Secondary | ICD-10-CM | POA: Diagnosis not present

## 2022-07-18 DIAGNOSIS — M79605 Pain in left leg: Secondary | ICD-10-CM

## 2022-07-18 NOTE — Progress Notes (Signed)
Doing ok; does have some soreness he states

## 2022-07-18 NOTE — Progress Notes (Signed)
Joel Boston Mccollom Sr. - 77 y.o. male MRN [id number]  Date of birth: 05-20-1945  Office Visit Note: Visit Date: 07/18/2022 PCP: Lindell Spar, MD Referred by: Lindell Spar, MD  Subjective: Chief Complaint  Patient presents with   Left Leg - Follow-up   HPI: Joel Vandegriff Kreiter Sr. is a pleasant 77 y.o. male who presents today for left lateral leg pain with gastrocnemius tear.   Joel Jones states he continues to get excellent improvement from the extracorporeal shockwave treatments. At this time, he feels like the pain around that knee and the muscle is completely resolved. He does have some joint pain from his rheumatoid arthritis but has an upcoming infusion tomorrow at the hospital.  He has stopped doing the specific home rehab exercises but still goes up and down stairs and remains active without any difficulty. Continues in the compression sock.  Pertinent ROS were reviewed with the patient and found to be negative unless otherwise specified above in HPI.   Assessment & Plan: Visit Diagnoses:  1. Gastrocnemius muscle tear, left, subsequent encounter   2. Pain in left leg    Plan: Discussed with Joel Jones that we both are pleased that he has had complete resolution of his pain.  He has full strength and range of motion of the gastrocnemius muscle and the knee with his prior replacement. We did complete 1 additional treatment session of extracorporeal shockwave therapy, at this point we will discontinue further treatments. He does not necessarily need to continue the home rehab exercises, but would recommend continuing to stay active and walking to use the muscle. If for some reason his pain comes back, he will let me know, otherwise follow-up as needed.  He will continue routine follow-up with Dr. Ninfa Linden for the contralateral knee, discussing knee replacement.  Follow-up: Return if symptoms worsen or fail to improve.   Meds & Orders: No orders of the defined types were placed in this encounter.  No orders  of the defined types were placed in this encounter.   Procedures: Procedure: ECSWT Indications:  Muscle tear, scar tissue   Procedure Details Consent: Risks of procedure as well as the alternatives and risks of each were explained to the patient.  Verbal consent for procedure obtained. Time Out: Verified patient identification, verified procedure, site was marked, verified correct patient position. The area was cleaned with alcohol swab.     The left proximal lateral gastrocnemius tendon/muscle was targeted for Extracorporeal shockwave therapy.    Preset: Status post muscular injury Power Level: 120 mJ Frequency: 14 Hz Impulse/cycles: 2300 Head size: Regular   Patient tolerated procedure well without immediate complications.       Clinical History: No specialty comments available.  He reports that he quit smoking about 54 years ago. His smoking use included cigarettes and cigars. He has a 5.00 pack-year smoking history. He has never been exposed to tobacco smoke. He has never used smokeless tobacco.  Recent Labs    08/02/21 0000 06/27/22 1450  HGBA1C 7.2 8.1*    Objective:    Physical Exam  Gen: Well-appearing, in no acute distress; non-toxic CV: Well-perfused. Warm.  Resp: Breathing unlabored on room air; no wheezing. Psych: Fluid speech in conversation; appropriate affect; normal thought process Neuro: Sensation intact throughout. No gross coordination deficits.   Ortho Exam -  Left leg: There is a well-healed incision from prior TKA.  There is no longer any tenderness to palpation over the lateral head of the gastrocnemius tendon.  No  overlying swelling or redness.  Full range of motion of the calf and ankle in all directions.   Imaging: No results found.  Past Medical/Family/Surgical/Social History: Medications & Allergies reviewed per EMR, new medications updated. Patient Active Problem List   Diagnosis Date Noted   Acute mucoid otitis media of left ear  06/27/2022   Gastrocnemius muscle tear, left, subsequent encounter 06/27/2022   Allergic sinusitis 02/24/2022   Asthmatic bronchitis , chronic 01/24/2022   Primary insomnia 10/06/2021   Gastroesophageal reflux disease 05/18/2021   Type 2 diabetes mellitus with other specified complication ([place]) [id number]   Long term (current) use of insulin ([place]) 05/18/2021   Obesity 05/18/2021   Elevated transaminase level 05/18/2021   Tear of left rotator cuff 09/24/2017   DDD (degenerative disc disease), cervical 10/31/2016   Primary osteoarthritis of both hands 10/31/2016   Primary osteoarthritis of both feet 10/31/2016   High risk medication use 07/06/2016   Rheumatoid arthritis involving multiple sites with positive rheumatoid factor ([place]) 06/02/2016   Contracture, elbow, left 06/02/2016   Osteoarthritis of right knee 08/27/2015   S/P TKR (total knee replacement), left 08/27/2015   Essential hypertension 05/11/2015   CAD (coronary artery disease) 04/02/2012   Sinus bradycardia    Hyperlipidemia    Past Medical History:  Diagnosis Date   AKI (acute kidney injury) ([place]) 05/18/2021   Anemia    Arthritis    "all over"   Bradycardia    Chronic bronchitis ([place])    "get it just about q yr" (03/17/2014)   Daily headache    "here lately" (03/17/2014); relates to sinuses   GERD (gastroesophageal reflux disease)    Hard of hearing    hearing aides bilat   Hiatal hernia    History of blood transfusion 2008   "related to OR"   History of kidney stones    Hyperlipidemia    Pneumonia 1972 X 1   Rheumatoid arthritis ([place])    Type II diabetes mellitus ([place])    Wears glasses    Family History  Problem Relation Age of Onset   Cancer Father        prostate   Past Surgical History:  Procedure Laterality Date   broken finger      surgical repaired left hand 2nd finger    cyst removed      per left knee/posteriorly   CYSTECTOMY Left 2009   "behind knee"   INGUINAL HERNIA REPAIR Right ?2010    JOINT REPLACEMENT     KNEE ARTHROSCOPY Bilateral 1980's   PARTIAL KNEE ARTHROPLASTY Right 08/27/2015   Procedure: RIGHT UNICOMPARTMENTAL KNEE ARTHROPLASTY;  Surgeon: Mcarthur Rossetti, MD;  Location: [facility];  Service: Orthopedics;  Laterality: Right;   REVISION TOTAL KNEE ARTHROPLASTY Left 2008   SHOULDER SURGERY Left 2019   TOTAL KNEE ARTHROPLASTY Left 2003   Social History   Occupational History    Employer: UNEMPLOYED  Tobacco Use   Smoking status: Former    Packs/day: 1.00    Years: 5.00    Total pack years: 5.00    Types: Cigarettes, Cigars    Quit date: 05/15/1968    Years since quitting: 54.2    Passive exposure: Never   Smokeless tobacco: Never  Vaping Use   Vaping Use: Never used  Substance and Sexual Activity   Alcohol use: No    Alcohol/week: 0.0 standard drinks of alcohol   Drug use: Never   Sexual activity: Yes

## 2022-07-19 ENCOUNTER — Encounter: Payer: Self-pay | Admitting: Rheumatology

## 2022-07-19 ENCOUNTER — Ambulatory Visit (INDEPENDENT_AMBULATORY_CARE_PROVIDER_SITE_OTHER): Payer: Medicare Other | Admitting: Rheumatology

## 2022-07-19 ENCOUNTER — Ambulatory Visit (HOSPITAL_COMMUNITY)
Admission: RE | Admit: 2022-07-19 | Discharge: 2022-07-19 | Disposition: A | Discharge status: Home / Self Care | Payer: Medicare Other | Source: Ambulatory Visit | Attending: Rheumatology | Admitting: Rheumatology

## 2022-07-19 VITALS — BP 121/75 | HR 89 | Ht 66.0 in | Wt 209.0 lb

## 2022-07-19 DIAGNOSIS — Z8639 Personal history of other endocrine, nutritional and metabolic disease: Secondary | ICD-10-CM

## 2022-07-19 DIAGNOSIS — Z92241 Personal history of systemic steroid therapy: Secondary | ICD-10-CM | POA: Diagnosis not present

## 2022-07-19 DIAGNOSIS — R5383 Other fatigue: Secondary | ICD-10-CM | POA: Insufficient documentation

## 2022-07-19 DIAGNOSIS — F5104 Psychophysiologic insomnia: Secondary | ICD-10-CM | POA: Insufficient documentation

## 2022-07-19 DIAGNOSIS — Z79899 Other long term (current) drug therapy: Secondary | ICD-10-CM | POA: Diagnosis not present

## 2022-07-19 DIAGNOSIS — Z8679 Personal history of other diseases of the circulatory system: Secondary | ICD-10-CM | POA: Insufficient documentation

## 2022-07-19 DIAGNOSIS — M19041 Primary osteoarthritis, right hand: Secondary | ICD-10-CM

## 2022-07-19 DIAGNOSIS — Z13820 Encounter for screening for osteoporosis: Secondary | ICD-10-CM | POA: Insufficient documentation

## 2022-07-19 DIAGNOSIS — M5030 Other cervical disc degeneration, unspecified cervical region: Secondary | ICD-10-CM | POA: Diagnosis not present

## 2022-07-19 DIAGNOSIS — M19072 Primary osteoarthritis, left ankle and foot: Secondary | ICD-10-CM | POA: Insufficient documentation

## 2022-07-19 DIAGNOSIS — M5136 Other intervertebral disc degeneration, lumbar region: Secondary | ICD-10-CM | POA: Diagnosis not present

## 2022-07-19 DIAGNOSIS — Z96651 Presence of right artificial knee joint: Secondary | ICD-10-CM

## 2022-07-19 DIAGNOSIS — M0579 Rheumatoid arthritis with rheumatoid factor of multiple sites without organ or systems involvement: Secondary | ICD-10-CM | POA: Diagnosis not present

## 2022-07-19 DIAGNOSIS — Z96652 Presence of left artificial knee joint: Secondary | ICD-10-CM

## 2022-07-19 DIAGNOSIS — M19042 Primary osteoarthritis, left hand: Secondary | ICD-10-CM | POA: Insufficient documentation

## 2022-07-19 DIAGNOSIS — Z111 Encounter for screening for respiratory tuberculosis: Secondary | ICD-10-CM | POA: Diagnosis not present

## 2022-07-19 DIAGNOSIS — M19071 Primary osteoarthritis, right ankle and foot: Secondary | ICD-10-CM | POA: Diagnosis not present

## 2022-07-19 LAB — COMPREHENSIVE METABOLIC PANEL
ALT: 28 U/L (ref 0–44)
AST: 25 U/L (ref 15–41)
Albumin: 3.8 g/dL (ref 3.5–5.0)
Alkaline Phosphatase: 94 U/L (ref 38–126)
Anion gap: 8 (ref 5–15)
BUN: 21 mg/dL (ref 8–23)
CO2: 25 mmol/L (ref 22–32)
Calcium: 8.8 mg/dL — ABNORMAL LOW (ref 8.9–10.3)
Chloride: 102 mmol/L (ref 98–111)
Creatinine, Ser: 1.07 mg/dL (ref 0.61–1.24)
GFR, Estimated: >60 mL/min (ref >60)
Glucose, Bld: 206 mg/dL — ABNORMAL HIGH (ref 70–99)
Potassium: 3.9 mmol/L (ref 3.5–5.1)
Sodium: 135 mmol/L (ref 135–145)
Total Bilirubin: 1 mg/dL (ref 0.3–1.2)
Total Protein: 6.6 g/dL (ref 6.5–8.1)

## 2022-07-19 LAB — CBC WITH DIFFERENTIAL/PLATELET
Abs Immature Granulocytes: 0.01 10*3/uL (ref 0.00–0.07)
Basophils Absolute: 0.1 10*3/uL (ref 0.0–0.1)
Basophils Relative: 1 %
Eosinophils Absolute: 0.2 10*3/uL (ref 0.0–0.5)
Eosinophils Relative: 3 %
HCT: 42.6 % (ref 39.0–52.0)
Hemoglobin: 14.9 g/dL (ref 13.0–17.0)
Immature Granulocytes: 0 %
Lymphocytes Relative: 43 %
Lymphs Abs: 2.4 10*3/uL (ref 0.7–4.0)
MCH: 31 pg (ref 26.0–34.0)
MCHC: 35 g/dL (ref 30.0–36.0)
MCV: 88.6 fL (ref 80.0–100.0)
Monocytes Absolute: 0.5 10*3/uL (ref 0.1–1.0)
Monocytes Relative: 9 %
Neutro Abs: 2.4 10*3/uL (ref 1.7–7.7)
Neutrophils Relative %: 44 %
Platelets: 251 10*3/uL (ref 150–400)
RBC: 4.81 MIL/uL (ref 4.22–5.81)
RDW: 12.9 % (ref 11.5–15.5)
WBC: 5.6 10*3/uL (ref 4.0–10.5)
nRBC: 0 % (ref 0.0–0.2)

## 2022-07-19 MED ORDER — DIPHENHYDRAMINE HCL 25 MG PO CAPS: 25.0000 mg | ORAL_CAPSULE | ORAL | Status: DC

## 2022-07-19 MED ORDER — ACETAMINOPHEN 325 MG PO TABS: 650.0000 mg | ORAL_TABLET | ORAL | Status: DC

## 2022-07-19 MED ORDER — SODIUM CHLORIDE 0.9 % IV SOLN
750.0000 mg | INTRAVENOUS | Status: DC
  Administered 2022-07-19: 750 mg via INTRAVENOUS
  Filled 2022-07-19: qty 30

## 2022-07-19 NOTE — Progress Notes (Signed)
Glucose is elevated-206.  Calcium remains low but has improved slightly. Rest of CMP WNL

## 2022-07-19 NOTE — Progress Notes (Signed)
CBC WNL

## 2022-07-19 NOTE — Patient Instructions (Signed)
Vaccines You are taking a medication(s) that can suppress your immune system.  The following immunizations are recommended: Flu annually Covid-19  RSV Td/Tdap (tetanus, diphtheria, pertussis) every 10 years Pneumonia (Prevnar 15 then Pneumovax 23 at least 1 year apart.  Alternatively, can take Prevnar 20 without needing additional dose) Shingrix: 2 doses from 4 weeks to 6 months apart  Please check with your PCP to make sure you are up to date.   If you have signs or symptoms of an infection or start antibiotics: First, call your PCP for workup of your infection. Hold your medication through the infection, until you complete your antibiotics, and until symptoms resolve if you take the following: Injectable medication (Actemra, Benlysta, Cimzia, Cosentyx, Enbrel, Humira, Kevzara, Orencia, Remicade, Simponi, Stelara, Taltz, Tremfya) Methotrexate Leflunomide (Arava) Mycophenolate (Cellcept) Roma Kayser, or Rinvoq   Stop Orencia 1 month later to the surgery and you Quirino restart Orencia 2 weeks after the surgery if there is no infection and you have a clearance by your surgeon.

## 2022-07-20 ENCOUNTER — Encounter: Payer: Self-pay | Admitting: Radiology

## 2022-07-20 DIAGNOSIS — I10 Essential (primary) hypertension: Secondary | ICD-10-CM | POA: Diagnosis not present

## 2022-07-20 DIAGNOSIS — Z794 Long term (current) use of insulin: Secondary | ICD-10-CM | POA: Diagnosis not present

## 2022-07-20 DIAGNOSIS — N182 Chronic kidney disease, stage 2 (mild): Secondary | ICD-10-CM | POA: Diagnosis not present

## 2022-07-20 DIAGNOSIS — E1122 Type 2 diabetes mellitus with diabetic chronic kidney disease: Secondary | ICD-10-CM | POA: Diagnosis not present

## 2022-07-25 LAB — QUANTIFERON-TB GOLD PLUS: QuantiFERON-TB Gold Plus: NEGATIVE

## 2022-07-25 LAB — QUANTIFERON-TB GOLD PLUS (RQFGPL)
QuantiFERON Mitogen Value: >10 IU/mL
QuantiFERON Nil Value: 0.08 IU/mL
QuantiFERON TB1 Ag Value: 0.13 IU/mL
QuantiFERON TB2 Ag Value: 0.08 IU/mL

## 2022-07-25 NOTE — Progress Notes (Signed)
TB gold negative

## 2022-08-01 ENCOUNTER — Other Ambulatory Visit: Payer: Self-pay

## 2022-08-01 ENCOUNTER — Encounter: Payer: Self-pay | Admitting: Sports Medicine

## 2022-08-01 ENCOUNTER — Ambulatory Visit (INDEPENDENT_AMBULATORY_CARE_PROVIDER_SITE_OTHER): Payer: Medicare Other | Admitting: Sports Medicine

## 2022-08-01 DIAGNOSIS — M4306 Spondylolysis, lumbar region: Secondary | ICD-10-CM | POA: Diagnosis not present

## 2022-08-01 DIAGNOSIS — G8929 Other chronic pain: Secondary | ICD-10-CM

## 2022-08-01 DIAGNOSIS — Z794 Long term (current) use of insulin: Secondary | ICD-10-CM

## 2022-08-01 DIAGNOSIS — M533 Sacrococcygeal disorders, not elsewhere classified: Secondary | ICD-10-CM | POA: Diagnosis not present

## 2022-08-01 DIAGNOSIS — E1169 Type 2 diabetes mellitus with other specified complication: Secondary | ICD-10-CM | POA: Diagnosis not present

## 2022-08-01 NOTE — Progress Notes (Signed)
Joel Boston Zapf Sr. - 77 y.o. male MRN [id number]  Date of birth: 01/18/1946  Office Visit Note: Visit Date: 08/01/2022 PCP: Lindell Spar, MD Referred by: Lindell Spar, MD  Subjective: Chief Complaint  Patient presents with   Lower Back - Pain   HPI: Joel Labella Hazell Sr. is a pleasant 77 y.o. male who presents today for left-sided low back pain.  Left-sided low back pain. Points to the SI joint.  This was bothering him in the past 07/18/2022 at her visit, settle down.  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  Denies any numbness or tingling, no weight radicular symptoms or leg weakness.  He is not taking any medication for this.  Last A1c was 7.2 on 08/02/21. A1c on 06/27/22 - 8.1. He does have a CGM.  Pertinent ROS were reviewed with the patient and found to be negative unless otherwise specified above in HPI.   Assessment & Plan: Visit Diagnoses:  1. Chronic left SI joint pain   2. Type 2 diabetes mellitus with other specified complication, with long-term current use of insulin (HCC)   3. Spondylolysis, lumbar region    Plan: Discussed with Ed that he does have spondylosis of the lumbar spine, but most of his pain seems to be emanating from the left SI joint.  Discussed all treatment options such as oral medication therapy, home exercises versus rehab, injection therapy.  He does not like taking medication and would like to proceed with ultrasound-guided injection.  SI joint injection performed today.  He Wyles ice/heat or take Tylenol over the next few days for any postinjection pain.  Will give this at least 2 weeks to settle down, for some reason he is still having pain he Hatcher re-present for evaluation and we will further evaluate the back. Otherwise f/u PRN.  Follow-up: Return if symptoms worsen or fail to improve.   Meds & Orders: No orders of the defined types were placed in this encounter.   Orders Placed This Encounter   Procedures   US Guided Needle Placement - No Linked Charges     Procedures: U/S-guided SI-joint injection, Left   After discussion of risk/benefits/indications, informed verbal consent was obtained. A timeout was then performed. The patient was positioned in a prone position on exam room table with a pillow placed under the pelvis for mild hip flexion. The SI joint area was cleaned and prepped with betadine and alcohol swabs. Sterile ultrasound gel was applied and the ultrasound transducer was placed in an anatomic axial plane over the PSIS, then moved distally over the SI-joint. Using ultrasound guidance, a 22-gauge, 3.5" needle was inserted from a medial to lateral approach utilizing an in-plane approach and directed into the SI-joint. The SI-joint was then injected with a mixture of 4:2 lidocaine:depomedrol with visualization of the injectate flow into the SI-joint under ultrasound visualization. The patient tolerated the procedure well without immediate complications.       Clinical History: No specialty comments available.  He reports that he quit smoking about 54 years ago. His smoking use included cigarettes and cigars. He has a 5.00 pack-year smoking history. He has never been exposed to tobacco smoke. He has never used smokeless tobacco.  Recent Labs    08/02/21 0000 06/27/22 1450  HGBA1C 7.2 8.1*    Objective:    Physical Exam  Gen: Well-appearing, in no acute distress; non-toxic CV: Regular Rate. Well-perfused. Warm.  Resp: Breathing unlabored on room air; no wheezing. Psych: Fluid speech in conversation; appropriate affect; normal thought process Neuro: Sensation intact throughout. No gross coordination deficits.   Ortho Exam - Lumbar spine: + TTP over left SI-joint.  He has full range of motion with flexion and extension of the lumbar spine, mild pain with extension.  Po sitive Fortin's point test.  5/5 strength of bilateral lower extremities.  Negative modified slump  test.  Imaging: Lumbar x-ray on 06/13/2021: Lumbar spine 2 views: Degenerative changes throughout.  No acute fractures  or acute findings.  Slight retrolisthesis L2 on L3.  Disc base narrowing  L2-3 unchanged from prior films.  Slight scoliosis.   Past Medical/Family/Surgical/Social History: Medications & Allergies reviewed per EMR, new medications updated. Patient Active Problem List   Diagnosis Date Noted   Acute mucoid otitis media of left ear 06/27/2022   Gastrocnemius muscle tear, left, subsequent encounter 06/27/2022   Allergic sinusitis 02/24/2022   Asthmatic bronchitis , chronic 01/24/2022   Primary insomnia 10/06/2021   Gastroesophageal reflux disease 05/18/2021   Type 2 diabetes mellitus with other specified complication ([place]) [id number]   Long term (current) use of insulin ([place]) 05/18/2021   Obesity 05/18/2021   Elevated transaminase level 05/18/2021   Tear of left rotator cuff 09/24/2017   DDD (degenerative disc disease), cervical 10/31/2016   Primary osteoarthritis of both hands 10/31/2016   Primary osteoarthritis of both feet 10/31/2016   High risk medication use 07/06/2016   Rheumatoid arthritis involving multiple sites with positive rheumatoid factor ([place]) 06/02/2016   Contracture, elbow, left 06/02/2016   Osteoarthritis of right knee 08/27/2015   S/P TKR (total knee replacement), left 08/27/2015   Essential hypertension 05/11/2015   CAD (coronary artery disease) 04/02/2012   Sinus bradycardia    Hyperlipidemia    Past Medical History:  Diagnosis Date   AKI (acute kidney injury) ([place]) 05/18/2021   Anemia    Arthritis    "all over"   Bradycardia    Chronic bronchitis ([place])    "get it just about q yr" (03/17/2014)   Daily headache    "here lately" (03/17/2014); relates to sinuses   GERD (gastroesophageal reflux disease)    Hard of hearing    hearing aides bilat   Hiatal hernia    History of blood transfusion 2008   "related to OR"   History of kidney  stones    Hyperlipidemia    Pneumonia 1972 X 1   Rheumatoid arthritis ([place])    Type II diabetes mellitus ([place])    Wears glasses    Family History  Problem Relation Age of Onset   Cancer Father        prostate   Past Surgical History:  Procedure Laterality Date   broken finger      surgical repaired left hand 2nd finger    cyst removed      per left knee/posteriorly   CYSTECTOMY Left 2009   "behind knee"   INGUINAL HERNIA REPAIR Right ?2010   JOINT REPLACEMENT     KNEE ARTHROSCOPY Bilateral 1980's   PARTIAL KNEE ARTHROPLASTY Right 08/27/2015   Procedure: RIGHT UNICOMPARTMENTAL KNEE ARTHROPLASTY;  Surgeon: Mcarthur Rossetti, MD;  Location: [facility];  Service: Orthopedics;  Laterality: Right;   REVISION TOTAL KNEE ARTHROPLASTY Left 2008   SHOULDER SURGERY Left 2019   TOTAL KNEE ARTHROPLASTY Left 2003   Social History   Occupational History    Employer: UNEMPLOYED  Tobacco Use   Smoking status: Former  Packs/day: 1.00    Years: 5.00    Additional pack years: 0.00    Total pack years: 5.00    Types: Cigarettes, Cigars    Quit date: 05/15/1968    Years since quitting: 54.2    Passive exposure: Never   Smokeless tobacco: Never  Vaping Use   Vaping Use: Never used  Substance and Sexual Activity   Alcohol use: No    Alcohol/week: 0.0 standard drinks of alcohol   Drug use: Never   Sexual activity: Yes

## 2022-08-02 DIAGNOSIS — L298 Other pruritus: Secondary | ICD-10-CM | POA: Diagnosis not present

## 2022-08-02 DIAGNOSIS — Z789 Other specified health status: Secondary | ICD-10-CM | POA: Diagnosis not present

## 2022-08-02 DIAGNOSIS — L728 Other follicular cysts of the skin and subcutaneous tissue: Secondary | ICD-10-CM | POA: Diagnosis not present

## 2022-08-02 DIAGNOSIS — R208 Other disturbances of skin sensation: Secondary | ICD-10-CM | POA: Diagnosis not present

## 2022-08-02 DIAGNOSIS — L723 Sebaceous cyst: Secondary | ICD-10-CM | POA: Diagnosis not present

## 2022-08-02 DIAGNOSIS — L820 Inflamed seborrheic keratosis: Secondary | ICD-10-CM | POA: Diagnosis not present

## 2022-08-02 DIAGNOSIS — L538 Other specified erythematous conditions: Secondary | ICD-10-CM | POA: Diagnosis not present

## 2022-08-03 ENCOUNTER — Other Ambulatory Visit: Payer: Self-pay | Admitting: Internal Medicine

## 2022-08-16 ENCOUNTER — Inpatient Hospital Stay (HOSPITAL_COMMUNITY): Admission: RE | Admit: 2022-08-16 | Payer: Medicare Other | Source: Ambulatory Visit

## 2022-08-23 ENCOUNTER — Ambulatory Visit (HOSPITAL_COMMUNITY)
Admission: RE | Admit: 2022-08-23 | Discharge: 2022-08-23 | Disposition: A | Discharge status: Home / Self Care | Payer: Medicare Other | Source: Ambulatory Visit | Attending: Rheumatology | Admitting: Rheumatology

## 2022-08-23 ENCOUNTER — Other Ambulatory Visit: Payer: Self-pay | Admitting: Pharmacist

## 2022-08-23 DIAGNOSIS — Z79899 Other long term (current) drug therapy: Secondary | ICD-10-CM

## 2022-08-23 DIAGNOSIS — M0579 Rheumatoid arthritis with rheumatoid factor of multiple sites without organ or systems involvement: Secondary | ICD-10-CM | POA: Diagnosis not present

## 2022-08-23 MED ORDER — DIPHENHYDRAMINE HCL 25 MG PO CAPS: 25.0000 mg | ORAL_CAPSULE | Freq: Once | ORAL | Status: DC

## 2022-08-23 MED ORDER — ACETAMINOPHEN 325 MG PO TABS: 650.0000 mg | ORAL_TABLET | Freq: Once | ORAL | Status: DC

## 2022-08-23 MED ORDER — SODIUM CHLORIDE 0.9 % IV SOLN
750.0000 mg | Freq: Once | INTRAVENOUS | Status: AC
  Administered 2022-08-23: 750 mg via INTRAVENOUS
  Filled 2022-08-23: qty 30

## 2022-08-23 NOTE — Progress Notes (Signed)
Next infusion scheduled for Orencia IV on 09/20/22 and due for updated orders. Diagnosis: RA  Dose:  every 4 weeks (appropriate based on last recorded weight of 93 kg)  Last Clinic Visit: 07/19/22 Next Clinic Visit: 12/19/22  Last infusion: 08/23/2022  Labs: CBC and CMP on 07/19/22 - CBC wnl. Glucose is elevated-206. Calcium remains low but has improved slightly. Rest of CMP WNL TB Gold: negative on 07/19/22   Orders placed for Orencia IV x 3 doses along with premedication of acetaminophen and diphenhydramine to be administered 30 minutes before medication infusion.  Standing CBC with diff/platelet and CMP with GFR orders placed to be drawn every 2 months.  Next TB gold due 07/19/2023  Chesley Mires, PharmD, MPH, BCPS, CPP Clinical Pharmacist (Rheumatology and Pulmonology)

## 2022-09-12 DIAGNOSIS — K5730 Diverticulosis of large intestine without perforation or abscess without bleeding: Secondary | ICD-10-CM | POA: Diagnosis not present

## 2022-09-12 DIAGNOSIS — Z1211 Encounter for screening for malignant neoplasm of colon: Secondary | ICD-10-CM | POA: Diagnosis not present

## 2022-09-12 DIAGNOSIS — D122 Benign neoplasm of ascending colon: Secondary | ICD-10-CM | POA: Diagnosis not present

## 2022-09-12 LAB — HM COLONOSCOPY

## 2022-09-13 ENCOUNTER — Encounter (HOSPITAL_COMMUNITY): Payer: Medicare Other

## 2022-09-20 ENCOUNTER — Ambulatory Visit (HOSPITAL_COMMUNITY)
Admission: RE | Admit: 2022-09-20 | Discharge: 2022-09-20 | Disposition: A | Discharge status: Home / Self Care | Payer: Medicare Other | Source: Ambulatory Visit | Attending: Rheumatology | Admitting: Rheumatology

## 2022-09-20 DIAGNOSIS — Z79899 Other long term (current) drug therapy: Secondary | ICD-10-CM | POA: Diagnosis not present

## 2022-09-20 DIAGNOSIS — M0579 Rheumatoid arthritis with rheumatoid factor of multiple sites without organ or systems involvement: Secondary | ICD-10-CM | POA: Diagnosis not present

## 2022-09-20 LAB — CBC WITH DIFFERENTIAL/PLATELET
Abs Immature Granulocytes: 0.03 10*3/uL (ref 0.00–0.07)
Basophils Absolute: 0.1 10*3/uL (ref 0.0–0.1)
Basophils Relative: 1 %
Eosinophils Absolute: 0.2 10*3/uL (ref 0.0–0.5)
Eosinophils Relative: 3 %
HCT: 40.8 % (ref 39.0–52.0)
Hemoglobin: 14.2 g/dL (ref 13.0–17.0)
Immature Granulocytes: 1 %
Lymphocytes Relative: 36 %
Lymphs Abs: 2.2 10*3/uL (ref 0.7–4.0)
MCH: 31.5 pg (ref 26.0–34.0)
MCHC: 34.8 g/dL (ref 30.0–36.0)
MCV: 90.5 fL (ref 80.0–100.0)
Monocytes Absolute: 0.7 10*3/uL (ref 0.1–1.0)
Monocytes Relative: 12 %
Neutro Abs: 2.9 10*3/uL (ref 1.7–7.7)
Neutrophils Relative %: 47 %
Platelets: 242 10*3/uL (ref 150–400)
RBC: 4.51 MIL/uL (ref 4.22–5.81)
RDW: 13.5 % (ref 11.5–15.5)
WBC: 6 10*3/uL (ref 4.0–10.5)
nRBC: 0 % (ref 0.0–0.2)

## 2022-09-20 LAB — COMPREHENSIVE METABOLIC PANEL
ALT: 28 U/L (ref 0–44)
AST: 32 U/L (ref 15–41)
Albumin: 3.6 g/dL (ref 3.5–5.0)
Alkaline Phosphatase: 85 U/L (ref 38–126)
Anion gap: 10 (ref 5–15)
BUN: 16 mg/dL (ref 8–23)
CO2: 23 mmol/L (ref 22–32)
Calcium: 8.5 mg/dL — ABNORMAL LOW (ref 8.9–10.3)
Chloride: 103 mmol/L (ref 98–111)
Creatinine, Ser: 1.13 mg/dL (ref 0.61–1.24)
GFR, Estimated: >60 mL/min (ref >60)
Glucose, Bld: 182 mg/dL — ABNORMAL HIGH (ref 70–99)
Potassium: 3.7 mmol/L (ref 3.5–5.1)
Sodium: 136 mmol/L (ref 135–145)
Total Bilirubin: 0.6 mg/dL (ref 0.3–1.2)
Total Protein: 6.2 g/dL — ABNORMAL LOW (ref 6.5–8.1)

## 2022-09-20 MED ORDER — DIPHENHYDRAMINE HCL 25 MG PO CAPS: 25.0000 mg | ORAL_CAPSULE | ORAL | Status: DC

## 2022-09-20 MED ORDER — ACETAMINOPHEN 325 MG PO TABS: 650.0000 mg | ORAL_TABLET | ORAL | Status: DC

## 2022-09-20 MED ORDER — SODIUM CHLORIDE 0.9 % IV SOLN
750.0000 mg | INTRAVENOUS | Status: DC
  Administered 2022-09-20: 750 mg via INTRAVENOUS
  Filled 2022-09-20: qty 30

## 2022-09-20 NOTE — Progress Notes (Signed)
Glucose is 182. Calcium remains low. Is he taking a calcium supplement? Total protein is low.  Rest of CMP WNL.

## 2022-09-20 NOTE — Progress Notes (Signed)
Cardiology Clinic Note   Patient Name: Joel Charon Cosman Sr. Date of Encounter: 09/22/2022  Primary Care Provider:  [name], MD Primary Cardiologist:  Olga Millers, MD  Patient Profile    Joel Hitt Taulbee Sr. 77 year old male presents the clinic today for follow-up evaluation of his coronary artery disease hypertension and sinus bradycardia.  Past Medical History    Past Medical History:  Diagnosis Date   AKI (acute kidney injury) (HCC) 05/18/2021   Anemia    Arthritis    "all over"   Bradycardia    Chronic bronchitis (HCC)    "get it just about q yr" (03/17/2014)   Daily headache    "here lately" (03/17/2014); relates to sinuses   GERD (gastroesophageal reflux disease)    Hard of hearing    hearing aides bilat   Hiatal hernia    History of blood transfusion 2008   "related to OR"   History of kidney stones    Hyperlipidemia    Pneumonia 1972 X 1   Rheumatoid arthritis (HCC)    Type II diabetes mellitus (HCC)    Wears glasses    Past Surgical History:  Procedure Laterality Date   broken finger      surgical repaired left hand 2nd finger    cyst removed      per left knee/posteriorly   CYSTECTOMY Left 2009   "behind knee"   INGUINAL HERNIA REPAIR Right ?2010   JOINT REPLACEMENT     KNEE ARTHROSCOPY Bilateral 1980's   PARTIAL KNEE ARTHROPLASTY Right 08/27/2015   Procedure: RIGHT UNICOMPARTMENTAL KNEE ARTHROPLASTY;  Surgeon: Kathryne Hitch, MD;  Location: [facility];  Service: Orthopedics;  Laterality: Right;   REVISION TOTAL KNEE ARTHROPLASTY Left 2008   SHOULDER SURGERY Left 2019   TOTAL KNEE ARTHROPLASTY Left 2003    Allergies  Allergies  Allergen Reactions   Codeine Swelling   Lipitor [Atorvastatin] Other (See Comments)    Muscle aches    Invokana [Canagliflozin] Other (See Comments)    Stomach upset    Losartan Nausea And Vomiting   Roxicodone [Oxycodone] Itching   Vicodin [Hydrocodone-Acetaminophen] Itching    History of Present Illness     Joel Mckittrick Bastos Sr. has a PMH of insulin-dependent type 2 diabetes, osteoarthritis, HLD, primary insomnia, obesity, coronary artery disease, sinus bradycardia, and HTN.  He had nuclear stress test in 2004 which was normal.  His echocardiogram 2/19 showed an EF of 60 to 65%.  He was seen in follow-up by Corine Shelter, PA-C on 11/20/2019.  He denied chest pain and shortness of breath.  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  He was following with Dr. Allayne Stack for his arthritis and doing well.  He denied presyncope, syncope and unusual weakness.  He was lost to follow-up.  He presents to the clinic today for follow-up evaluation and states he has noticed some increased fatigue which he attributes to age.Marland Kitchen  He has returned to work part-time.  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  He presents today with his wife.  His EKG shows new atrial fibrillation rate controlled at 77 bpm.  We reviewed stroke risk.  They expressed understanding.  I will start him on apixaban 5 mg twice daily and have him return to the clinic in 3 weeks for follow-up evaluation.  He is cardiac unaware.    Today  denies chest pain, shortness of breath, lower extremity edema, palpitations, melena, hematuria, hemoptysis, diaphoresis, weakness, presyncope, syncope, orthopnea, and PND.     Home Medications    Prior to Admission medications   Medication Sig Start Date End Date Taking? Authorizing Provider  Abatacept (ORENCIA IV) Inject 750 mg into the vein every 28 (twenty-eight) days.    [provider]  albuterol (VENTOLIN HFA) 108 (90 Base) MCG/ACT inhaler Inhale 2 puffs into the lungs every 6 (six) hours as needed for wheezing or shortness of breath. 01/23/22   [name], MD  amLODipine (NORVASC) 5 MG tablet Take 1 tablet (5 mg total) by mouth daily. 10/06/21   [name], MD  Ascorbic Acid (VITAMIN C PO) Take  by mouth daily.    [provider]  Ascorbic Acid (VITAMIN C) 500 MG CHEW 1 tablet Orally daily    [provider]  aspirin EC 81 MG tablet Take 81 mg by mouth daily.    [provider]  Calcium Carbonate-Vit D-Min (CALCIUM 1200 PO) Take by mouth daily.    [provider]  cetirizine (ZYRTEC) 10 MG tablet Take 1 tablet (10 mg total) by mouth at bedtime. 02/24/22   [name], MD  Cholecalciferol (VITAMIN D3 PO) Take by mouth daily.    [provider]  Continuous Blood Gluc Sensor (FREESTYLE LIBRE 2 SENSOR) MISC . Marland Kitchen change sensor every 14 days on insulin pump for 90 days 02/04/21   [provider]  empagliflozin (JARDIANCE) 10 MG TABS tablet 1 tablet Orally Once a day 11/11/20   [provider]  fluticasone (FLONASE) 50 MCG/ACT nasal spray Place 2 sprays into both nostrils daily. Patient not taking: Reported on 07/19/2022 02/24/22   [name], MD  HUMALOG 100 UNIT/ML injection INJ UP TO 65 UNI [place] VIA INSULIN PUMP QD 05/06/18   [provider]  HYDROcodone-acetaminophen (NORCO) 5-325 MG tablet Take 1 tablet by mouth every 6 (six) hours as needed for moderate pain. Patient not taking: Reported on 07/19/2022 08/20/21   Edwin Dada P, DO  hydrOXYzine (VISTARIL) 25 MG capsule Take 1 capsule (25 mg total) by mouth at bedtime as needed (Insomnia). 10/06/21   [name], MD  insulin aspart (NOVOLOG) 100 UNIT/ML FlexPen Inject 1 Units into the skin 3 (three) times daily with meals. 1 unit per hour    [provider]  insulin glargine (LANTUS) 100 UNIT/ML injection 16 units Subcutaneous once a day if unable to use pump for 90 days    [provider]  KRILL OIL 1000 MG CAPS Take 1,000 mg by mouth daily.    [provider]  Lancets (ONETOUCH ULTRASOFT) lancets FOR USE WHEN CHECKING BLOOD SUGAR QID. 05/20/16   [provider]  loratadine (CLARITIN) 10 MG tablet 1 tablet Orally Once a day    [provider]  Misc Natural Products (TART CHERRY ADVANCED PO) Take by mouth daily.    [provider]  Multiple Vitamin (MULTIVITAMIN WITH MINERALS) TABS Take 1 tablet by mouth daily.    [provider]  Multiple Vitamins-Minerals (ZINC PO) Take by mouth daily.    [provider]  ofloxacin (FLOXIN) 0.3 % OTIC solution Place 5 drops into the left ear daily. 06/27/22   [name], MD  ONE TOUCH ULTRA TEST test strip USE WHEN CHECKING BLOOD SUGAR QID. 05/26/16   [provider]  pantoprazole (PROTONIX) 40 MG tablet TAKE 1 TABLET DAILY. NEED OFFICE VISIT  AND LABS BEFORE FURTHER REFILLS 10/08/18   Donita Brooks, MD  potassium chloride (KLOR-CON) 10 MEQ tablet Take by mouth. 05/19/21   [provider]  POTASSIUM PO Take by mouth daily.    [provider]  pravastatin (PRAVACHOL) 40 MG tablet TAKE 1 TABLET EVERY EVENING 08/03/22   [name], MD  Pyridoxine HCl (VITAMIN B-6 PO) Take by mouth daily.    [provider]  ramipril (ALTACE) 5 MG capsule Take 1 capsule (5 mg total) by mouth daily. 10/06/21   [name], MD  tizanidine (ZANAFLEX) 2 MG capsule Take 1 capsule (2 mg total) by mouth 3 (three) times daily. 06/13/21   Kirtland Bouchard, PA-C  vitamin E 400 UNIT capsule Take 400 Units by mouth daily.    [provider]    Family History    Family History  Problem Relation Age of Onset   Cancer Father        prostate   He indicated that his mother is deceased. He indicated that his father is deceased. He indicated that his sister is alive. He indicated that three of his four brothers are alive.  Social History    Social History   Socioeconomic History   Marital status: Married    Spouse name: Genevie Cheshire   Number of children: 3   Years of education: College   Highest education level: Not on file  Occupational History    Employer: UNEMPLOYED  Tobacco Use   Smoking status: Former    Packs/day: 1.00     Years: 5.00    Additional pack years: 0.00    Total pack years: 5.00    Types: Cigarettes, Cigars    Quit date: 05/15/1968    Years since quitting: 54.3    Passive exposure: Never   Smokeless tobacco: Never  Vaping Use   Vaping Use: Never used  Substance and Sexual Activity   Alcohol use: No    Alcohol/week: 0.0 standard drinks of alcohol   Drug use: Never   Sexual activity: Yes  Other Topics Concern   Not on file  Social History Narrative   Patient lives at home with family.   Caffeine Use: occasionally   Social Determinants of Health   Financial Resource Strain: Not on file  Food Insecurity: Not on file  Transportation Needs: Not on file  Physical Activity: Not on file  Stress: Not on file  Social Connections: Not on file  Intimate Partner Violence: Not on file     Review of Systems    General:  No chills, fever, night sweats or weight changes.  Cardiovascular:  No chest pain, dyspnea on exertion, edema, orthopnea, palpitations, paroxysmal nocturnal dyspnea. Dermatological: No rash, lesions/masses Respiratory: No cough, dyspnea Urologic: No hematuria, dysuria Abdominal:   No nausea, vomiting, diarrhea, bright red blood per rectum, melena, or hematemesis Neurologic:  No visual changes, wkns, changes in mental status. All other systems reviewed and are otherwise negative except as noted above.  Physical Exam    VS:  BP 112/76 (BP Location: Left Arm, Patient Position: Sitting, Cuff Size: Normal)   Ht 5\' 6"  (1.676 m)   Wt 212 lb 9.6 oz (96.4 kg)   SpO2 95%   BMI 34.31 kg/m  , BMI Body mass index is 34.31 kg/m. GEN: Well nourished, well developed, in no acute distress. HEENT: normal. Neck: Supple, no JVD, carotid bruits, or masses. Cardiac: RRR, no murmurs, rubs, or gallops. No clubbing, cyanosis, edema.  Radials/DP/PT 2+  and equal bilaterally.  Respiratory:  Respirations regular and unlabored, clear to auscultation bilaterally. GI: Soft, nontender, nondistended,  BS + x 4. MS: no deformity or atrophy. Skin: warm and dry, no rash. Neuro:  Strength and sensation are intact. Psych: Normal affect.  Accessory Clinical Findings    Recent Labs: 09/20/2022: ALT 28; BUN 16; Creatinine, Ser 1.13; Hemoglobin 14.2; Platelets 242; Potassium 3.7; Sodium 136   Recent Lipid Panel    Component Value Date/Time   CHOL 120 05/20/2019 1102   TRIG 41 05/20/2019 1102   HDL 41 05/20/2019 1102   CHOLHDL 2.9 05/20/2019 1102   CHOLHDL 3.6 01/23/2019 0811   VLDL 10 01/23/2019 0811   LDLCALC 69 05/20/2019 1102         ECG personally reviewed by me today-atrial fibrillation with premature aberrantly conducted complexes incomplete right bundle branch block 77 bpm  Echocardiogram 06/20/2017   -------------------------------------------------------------------  LV EF: 60% -   65%   -------------------------------------------------------------------  Indications:     CAD (I25.10).   -------------------------------------------------------------------  History:  PMH:  Hyperlipidemia.  Risk factors:  Hypertension.  Diabetes mellitus.   -------------------------------------------------------------------  Study Conclusions   - Left ventricle: The cavity size was normal. Wall thickness was    increased in a pattern of mild LVH. Systolic function was normal.    The estimated ejection fraction was in the range of 60% to 65%.    Wall motion was normal; there were no regional wall motion    abnormalities. Doppler parameters are consistent with abnormal    left ventricular relaxation (grade 1 diastolic dysfunction).  - Aortic valve: There was no stenosis.  - Mitral valve: There was no significant regurgitation.  - Left atrium: The atrium was mildly dilated.  - Right ventricle: The cavity size was normal. Systolic function    was normal.  - Tricuspid valve: Peak RV-RA gradient (S): 31 mm Hg.  - Pulmonary arteries: PA peak pressure: 34 mm Hg (S).  - Inferior vena cava:  The vessel was normal in size. The    respirophasic diameter changes were in the normal range (= 50%),    consistent with normal central venous pressure.   Impressions:   - Normal LV size with mild LV hypertrophy. EF 60-65%. Normal RV    size and systolic function. No significant valvular    abnormalities.   -------------------------------------------------------------------  Study data:  No prior study was available for comparison.  Study  status:  Routine.  Procedure:  The patient reported no pain pre or  post test. Transthoracic echocardiography. Image quality was  adequate.          Transthoracic echocardiography.  M-mode,  complete 2D, spectral Doppler, and color Doppler.  Birthdate:  Patient birthdate: 1946/01/07.  Age:  Patient is 77 yr old.  Sex:  Gender: male.    BMI: 32.6 kg/m^2.  Blood pressure:     116/68  Patient status:  Outpatient.  Study date:  Study date: 06/20/2017.  Study time: 10:42 AM.  Location:   Site 3   -------------------------------------------------------------------   -------------------------------------------------------------------  Left ventricle:  The cavity size was normal. Wall thickness was  increased in a pattern of mild LVH. Systolic function was normal.  The estimated ejection fraction was in the range of 60% to 65%.  Wall motion was normal; there were no regional wall motion  abnormalities. Doppler parameters are consistent with abnormal left  ventricular relaxation (grade 1 diastolic dysfunction).   -------------------------------------------------------------------  Aortic valve:   Trileaflet.  Doppler:   There was no stenosis.  There was no regurgitation.   -------------------------------------------------------------------  Aorta: Aortic root: The aortic root was normal in size.  Ascending aorta: The ascending aorta was normal in size.   -------------------------------------------------------------------  Mitral valve:    Normal thickness leaflets .  Doppler:   There was  no evidence for stenosis.   There was no significant regurgitation.    -------------------------------------------------------------------  Left atrium:  The atrium was mildly dilated.   -------------------------------------------------------------------  Right ventricle:  The cavity size was normal. Systolic function was  normal.   -------------------------------------------------------------------  Pulmonic valve:    Structurally normal valve.   Cusp separation was  normal.  Doppler:  Transvalvular velocity was within the normal  range. There was trivial regurgitation.   -------------------------------------------------------------------  Tricuspid valve:   Doppler:  There was trivial regurgitation.    -------------------------------------------------------------------  Right atrium:  The atrium was normal in size.   -------------------------------------------------------------------  Pericardium: There was no pericardial effusion.   -------------------------------------------------------------------  Systemic veins:  Inferior vena cava: The vessel was normal in size. The  respirophasic diameter changes were in the normal range (= 50%),  consistent with normal central venous pressure. Diameter: 18 mm.   nuclear stress test 08/20/2002 Low risk.  There was some concern for balanced ischemia.  Assessment & Plan   1.  Atrial fibrilation-EKG today shows atrial fibrillation with prematurely aberrantly conducted complexes incomplete right bundle branch block 77 bpm.  Asymptomatic.  Remains very physically active.  Denies lightheadedness, presyncope or syncope.This patients CHA2DS2-VASc Score and unadjusted Ischemic Stroke Rate (% per year) is equal to 4.8 % stroke rate/year from a score of 4 Above score calculated as 1 point each if present [HTN,  Vascular=MI/PAD/Aortic Plaque, Age x 2 Start apixaban 5 mg twice daily-samples  given Maintain physical activity   Hyperlipidemia-LDL 71 on 04/18/22. Continue pravastatin High-fiber diet Maintain physical activity Repeat fasting lipids and LFTs- 12/24  Essential hypertension-BP today 112/76. Maintain blood pressure log Continue current medical therapy Low salt diet- Salty 6 given   Type 2 diabetes-compliant with insulin. Carb modified diet Continue current medical therapy Follows with PCP  Disposition: Follow-up with Dr. Jens [name] or me in 3 weeks.   Thomasene Ripple. Nailyn Dearinger NP-C     09/22/2022, 3:58 PM [facility] [address] Office [phone] Fax [phone]    I spent 14 minutes examining this patient, reviewing medications, and using patient centered shared decision making involving her cardiac care.  Prior to her visit I spent greater than 20 minutes reviewing her past medical history,  medications, and prior cardiac tests.

## 2022-09-20 NOTE — Progress Notes (Signed)
CBC WNL

## 2022-09-20 NOTE — Progress Notes (Signed)
We will continue to monitor lab work closely.

## 2022-09-22 ENCOUNTER — Ambulatory Visit: Payer: Medicare Other | Attending: General Practice | Admitting: General Practice

## 2022-09-22 ENCOUNTER — Encounter: Payer: Self-pay | Admitting: General Practice

## 2022-09-22 VITALS — BP 112/76 | Ht 66.0 in | Wt 212.6 lb

## 2022-09-22 DIAGNOSIS — I10 Essential (primary) hypertension: Secondary | ICD-10-CM | POA: Insufficient documentation

## 2022-09-22 DIAGNOSIS — R001 Bradycardia, unspecified: Secondary | ICD-10-CM | POA: Diagnosis not present

## 2022-09-22 DIAGNOSIS — E1169 Type 2 diabetes mellitus with other specified complication: Secondary | ICD-10-CM | POA: Insufficient documentation

## 2022-09-22 DIAGNOSIS — E782 Mixed hyperlipidemia: Secondary | ICD-10-CM | POA: Diagnosis not present

## 2022-09-22 DIAGNOSIS — Z794 Long term (current) use of insulin: Secondary | ICD-10-CM | POA: Diagnosis not present

## 2022-09-22 MED ORDER — APIXABAN 5 MG PO TABS: 5.0000 mg | ORAL_TABLET | Freq: Two times a day (BID) | ORAL | 6 refills | Status: DC

## 2022-09-22 NOTE — Patient Instructions (Addendum)
Medication Instructions:  START ELIQUIS 5MG  TWICE DAILY *If you need a refill on your cardiac medications before your next appointment, please call your pharmacy*  Lab Work: FASTING LAB IN DECEMBER 2024 If you have labs (blood work) drawn today and your tests are completely normal, you will receive your results only by:  MyChart Message (if you have MyChart) OR  A paper copy in the mail If you have any lab test that is abnormal or we need to change your treatment, we will call you to review the results.  Other Instructions INCREASE PHYSICAL ACTIVITY AS TOLERATED  PLEASE READ AND FOLLOW ATTACHED  SALTY 6   Please try to avoid these triggers: Do not use any products that have nicotine or tobacco in them. These include cigarettes, e-cigarettes, and chewing tobacco. If you need help quitting, ask your doctor. Eat heart-healthy foods. Talk with your doctor about the right eating plan for you. Exercise regularly as told by your doctor. Stay hydrated Do not drink alcohol, Caffeine or chocolate. Lose weight if you are overweight. Do not use drugs, including cannabis  Follow-Up: At [facility], you and your health needs are our priority.  As part of our continuing mission to provide you with exceptional heart care, we have created designated Provider Care Teams.  These Care Teams include your primary Cardiologist (physician) and Advanced Practice Providers (APPs -  Physician Assistants and Nurse Practitioners) who all work together to provide you with the care you need, when you need it.  Your next appointment:   3 week(s)  Provider:   Olga Millers, MD OR Edd Fabian, FNP-C

## 2022-09-25 ENCOUNTER — Telehealth: Payer: Self-pay | Admitting: General Practice

## 2022-09-25 NOTE — Telephone Encounter (Signed)
Spoke with pt's wife, [name] (ok per DPR) regarding issues that they believe are related to pt starting to take eliquis. Pt was seen in the office on Friday with Edd Fabian, NP. Pt was in a-fib and was advised to start Eliquis 5mg  twice daily. [name] states that pt feels lightheaded like he Gudgel pass out. Muscles are hurting and achy. Wife also states that pt is having swelling of his hands and legs. Pt states that this is how he felt before he was placed on medication for rheumatoid arthritis. Explained that these are not symptoms usually seen with starting Eliquis. Advised that pt to not stop taking medication and to wait to hear back from [place]. Will send over to PharmD and providers to advise.

## 2022-09-25 NOTE — Telephone Encounter (Signed)
Pt c/o medication issue:  1. Name of Medication: apixaban (ELIQUIS) 5 MG TABS tablet   2. How are you currently taking this medication (dosage and times per day)? 1 tablet 2x daily  3. Are you having a reaction (difficulty breathing--STAT)? no  4. What is your medication issue? Wife states patient feels lightheaded like he is going to pass out, hands and muscles are hurting and achy.  Wife states his hands are swollen. Patient was just given this medication on Friday.

## 2022-09-26 NOTE — Telephone Encounter (Signed)
Do not see any drug drug interactions and adverse effects do not sound typical from Eliquis. Patient takes Orencia for RA and received infusion on 5/8. Could be the cause, will route to rheumatology for input

## 2022-09-26 NOTE — Telephone Encounter (Signed)
Spoke with pt wife, she reports he is feeling some better. She reports they are not able to afford the eliuis, patient assistance form mailed to the patient.

## 2022-09-28 ENCOUNTER — Telehealth: Payer: Self-pay

## 2022-09-28 NOTE — Telephone Encounter (Signed)
Opened a new telephone encounter.

## 2022-09-28 NOTE — Telephone Encounter (Signed)
Patient received last Orencia infusion on 09/20/2022 (these infusions are every 4 weeks). Hand swelling Briney be related to RA flare. Will have clinical team reach out to patient to assess symptoms.  Agree with continuing Eliquis.  Chesley Mires, PharmD, MPH, BCPS, CPP Clinical Pharmacist (Rheumatology and Pulmonology)

## 2022-09-28 NOTE — Telephone Encounter (Signed)
Received message from [facility], PharmD to contact patient in regards to hand swelling symptoms.  I called the patient and he was unavailable at the moment, his wife will have him return the call.

## 2022-09-29 NOTE — Telephone Encounter (Signed)
I returned patient's call.  Patient is not sure about swelling.  He states his hands hurt towards the end of the day.  He has noticed that some of his knuckles are turning.  I advised him to take Tylenol for now.  I discouraged the use of prednisone as he is diabetic and has heart disease.  Patient was in agreement.  He will contact us if his symptoms get worse.

## 2022-09-29 NOTE — Telephone Encounter (Signed)
Patient states he is having swelling in his right hand and his thumb on his left hand. Patient states the pain and swelling have been going on for a couple of months. Patient states it has hurt a little more with the rain. Patient states nothing makes it better or worse. Patient states his last infusion was about 09/20/2022. Please advise.

## 2022-10-02 ENCOUNTER — Telehealth: Payer: Self-pay | Admitting: Cardiology

## 2022-10-02 NOTE — Telephone Encounter (Signed)
Pt spouse calling back for an update  

## 2022-10-02 NOTE — Telephone Encounter (Signed)
Patient calling the office for samples of medication:   1.  What medication and dosage are you requesting samples for? pixaban (ELIQUIS) 5 MG TABS tablet   2.  Are you currently out of this medication? Will be soon, has enough until this coming Friday.

## 2022-10-03 ENCOUNTER — Telehealth: Payer: Self-pay | Admitting: Cardiology

## 2022-10-03 MED ORDER — APIXABAN 5 MG PO TABS: 5.0000 mg | ORAL_TABLET | Freq: Two times a day (BID) | ORAL | 0 refills | Status: DC

## 2022-10-03 NOTE — Telephone Encounter (Signed)
Paper Work Dropped Off: [place]qu[place] assistance paperwork  Date: 05.21.24  Location of paper: Dr [employer]

## 2022-10-03 NOTE — Telephone Encounter (Signed)
Samples placed at front desk to patient pickup.  Wife aware and will drop off assistance paperwork at pick up

## 2022-10-03 NOTE — Telephone Encounter (Signed)
Patient's spouse is calling to follow up on samples. Patient's spouse stated the patient is leaving out of town tomorrow and needs to pick these up today. Please advise.

## 2022-10-03 NOTE — Telephone Encounter (Signed)
Reviewed chart  Age 77,  Wt  96.4 kg SCr 1.24  Pt confirmed on Eliquis 5 mg daily

## 2022-10-05 ENCOUNTER — Encounter: Payer: Self-pay | Admitting: *Deleted

## 2022-10-05 NOTE — Telephone Encounter (Signed)
Patient assistance for eliquis faxed to [employer]

## 2022-10-12 DIAGNOSIS — H9192 Unspecified hearing loss, left ear: Secondary | ICD-10-CM | POA: Diagnosis not present

## 2022-10-13 ENCOUNTER — Telehealth: Payer: Self-pay | Admitting: Cardiology

## 2022-10-13 NOTE — Telephone Encounter (Signed)
Wife is aware we are waiting on approval for samples

## 2022-10-13 NOTE — Telephone Encounter (Signed)
Pateient would like status on the medication.  If nor back is it OK to give samples?  He has a 7 day supply at this time. Please advise

## 2022-10-13 NOTE — Telephone Encounter (Signed)
Patient's wife is following up requesting that her call be returned to her mobile line because she will not be home.

## 2022-10-13 NOTE — Telephone Encounter (Signed)
Patient calling the office for samples of medication:   1.  What medication and dosage are you requesting samples for?   apixaban (ELIQUIS) 5 MG TABS tablet     2.  Are you currently out of this medication? Not yet. Pt wife is concerned that pt will run out of samples before they hear back from [facility]. Please advise.

## 2022-10-16 ENCOUNTER — Ambulatory Visit: Payer: Medicare Other | Admitting: Internal Medicine

## 2022-10-16 MED ORDER — APIXABAN 5 MG PO TABS: 5.0000 mg | ORAL_TABLET | Freq: Two times a day (BID) | ORAL | 0 refills | Status: DC

## 2022-10-16 NOTE — Telephone Encounter (Signed)
Samples are at check in , ready for pick up

## 2022-10-16 NOTE — Telephone Encounter (Signed)
Attempt to call the patient, unable to leave voicemail message due to voicemail box is full.

## 2022-10-16 NOTE — Telephone Encounter (Signed)
Looks like [employer] application was submitted 10/05/22 for pt assistance.  Ok to provide 1 more week of samples while application is pending. Eliquis 5mg  dose is correct.

## 2022-10-18 ENCOUNTER — Ambulatory Visit (HOSPITAL_COMMUNITY)
Admission: RE | Admit: 2022-10-18 | Discharge: 2022-10-18 | Disposition: A | Discharge status: Home / Self Care | Payer: Medicare Other | Source: Ambulatory Visit | Attending: Rheumatology | Admitting: Rheumatology

## 2022-10-18 DIAGNOSIS — Z79899 Other long term (current) drug therapy: Secondary | ICD-10-CM | POA: Diagnosis not present

## 2022-10-18 DIAGNOSIS — M0579 Rheumatoid arthritis with rheumatoid factor of multiple sites without organ or systems involvement: Secondary | ICD-10-CM | POA: Diagnosis not present

## 2022-10-18 MED ORDER — ACETAMINOPHEN 325 MG PO TABS: 650.0000 mg | ORAL_TABLET | ORAL | Status: DC

## 2022-10-18 MED ORDER — SODIUM CHLORIDE 0.9 % IV SOLN
750.0000 mg | INTRAVENOUS | Status: DC
  Administered 2022-10-18: 750 mg via INTRAVENOUS
  Filled 2022-10-18: qty 30

## 2022-10-18 MED ORDER — DIPHENHYDRAMINE HCL 25 MG PO CAPS: 25.0000 mg | ORAL_CAPSULE | ORAL | Status: DC

## 2022-10-20 DIAGNOSIS — E1122 Type 2 diabetes mellitus with diabetic chronic kidney disease: Secondary | ICD-10-CM | POA: Diagnosis not present

## 2022-10-20 DIAGNOSIS — I10 Essential (primary) hypertension: Secondary | ICD-10-CM | POA: Diagnosis not present

## 2022-10-20 DIAGNOSIS — N182 Chronic kidney disease, stage 2 (mild): Secondary | ICD-10-CM | POA: Diagnosis not present

## 2022-10-20 DIAGNOSIS — Z794 Long term (current) use of insulin: Secondary | ICD-10-CM | POA: Diagnosis not present

## 2022-10-22 NOTE — H&P (View-Only) (Signed)
Cardiology Clinic Note   Patient Name: Joel Mancillas Miu Sr. Date of Encounter: 10/23/2022  Primary Care Provider:  [name], MD Primary Cardiologist:  Olga Millers, MD  Patient Profile    Joel Cower Snoke Sr. 77 year old male presents the clinic today for follow-up evaluation of his atrial fibrillation.  Past Medical History    Past Medical History:  Diagnosis Date   AKI (acute kidney injury) (HCC) 05/18/2021   Anemia    Arthritis    "all over"   Bradycardia    Chronic bronchitis (HCC)    "get it just about q yr" (03/17/2014)   Daily headache    "here lately" (03/17/2014); relates to sinuses   GERD (gastroesophageal reflux disease)    Hard of hearing    hearing aides bilat   Hiatal hernia    History of blood transfusion 2008   "related to OR"   History of kidney stones    Hyperlipidemia    Pneumonia 1972 X 1   Rheumatoid arthritis (HCC)    Type II diabetes mellitus (HCC)    Wears glasses    Past Surgical History:  Procedure Laterality Date   broken finger      surgical repaired left hand 2nd finger    cyst removed      per left knee/posteriorly   CYSTECTOMY Left 2009   "behind knee"   INGUINAL HERNIA REPAIR Right ?2010   JOINT REPLACEMENT     KNEE ARTHROSCOPY Bilateral 1980's   PARTIAL KNEE ARTHROPLASTY Right 08/27/2015   Procedure: RIGHT UNICOMPARTMENTAL KNEE ARTHROPLASTY;  Surgeon: Kathryne Hitch, MD;  Location: [facility];  Service: Orthopedics;  Laterality: Right;   REVISION TOTAL KNEE ARTHROPLASTY Left 2008   SHOULDER SURGERY Left 2019   TOTAL KNEE ARTHROPLASTY Left 2003    Allergies  Allergies  Allergen Reactions   Codeine Swelling   Lipitor [Atorvastatin] Other (See Comments)    Muscle aches    Invokana [Canagliflozin] Other (See Comments)    Stomach upset    Losartan Nausea And Vomiting   Roxicodone [Oxycodone] Itching   Vicodin [Hydrocodone-Acetaminophen] Itching    History of Present Illness    Joel Howry Birkel Sr.  has a PMH of  insulin-dependent type 2 diabetes, osteoarthritis, HLD, primary insomnia, obesity, coronary artery disease, sinus bradycardia, and HTN.  He had nuclear stress test in 2004 which was normal.  His echocardiogram 2/19 showed an EF of 60 to 65%.   He was seen in follow-up by Corine Shelter, PA-C on 11/20/2019.  He denied chest pain and shortness of breath.  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  He was following with Dr. Allayne Stack for his arthritis and doing well.  He denied presyncope, syncope and unusual weakness.   He was lost to follow-up.  He presented to the clinic 09/22/22 for follow-up evaluation and stated he had noticed some increased fatigue which he attributed to age.  He had returned to work part-time.  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  He presented  with his wife.  His EKG showed new atrial fibrillation rate controlled at 77 bpm.  We reviewed stroke risk.  They expressed understanding.  I  started him on apixaban 5 mg twice daily and planned return to the clinic in 3 weeks for follow-up evaluation.  He was cardiac unaware.   He presents to the  clinic today for follow-up evaluation and states he feels somewhat better than he did 1 month ago.  He reports his fatigue.  We reviewed his EKG which continues to show atrial fibrillation.  We reviewed risks and benefits of apixaban and DCCV.  He expressed understanding.  He presents with his wife.  [place].  He agrees to do cardioversion next week.  I will order CBC, BMP, echocardiogram and plan follow-up after cardioversion.  He has not missed any doses of his apixaban and denies bleeding issues.     Today denies chest pain, shortness of breath, lower extremity edema, palpitations, melena, hematuria, hemoptysis, diaphoresis, weakness, presyncope, syncope, orthopnea, and  PND.    Home Medications    Prior to Admission medications   Medication Sig Start Date End Date Taking? Authorizing Provider  Abatacept (ORENCIA IV) Inject 750 mg into the vein every 28 (twenty-eight) days.    [provider]  albuterol (VENTOLIN HFA) 108 (90 Base) MCG/ACT inhaler Inhale 2 puffs into the lungs every 6 (six) hours as needed for wheezing or shortness of breath. Patient not taking: Reported on 09/22/2022 01/23/22   [name], MD  amLODipine (NORVASC) 5 MG tablet Take 1 tablet (5 mg total) by mouth daily. 10/06/21   [name], MD  apixaban (ELIQUIS) 5 MG TABS tablet Take 1 tablet (5 mg total) by mouth 2 (two) times daily. 10/03/22   Lewayne Bunting, MD  apixaban (ELIQUIS) 5 MG TABS tablet Take 1 tablet (5 mg total) by mouth 2 (two) times daily. 10/16/22   Lewayne Bunting, MD  Ascorbic Acid (VITAMIN C PO) Take by mouth daily. Patient not taking: Reported on 09/22/2022    [provider]  Ascorbic Acid (VITAMIN C) 500 MG CHEW 1 tablet Orally daily    [provider]  aspirin EC 81 MG tablet Take 81 mg by mouth daily.    [provider]  Calcium Carbonate-Vit D-Min (CALCIUM 1200 PO) Take by mouth daily.    [provider]  cetirizine (ZYRTEC) 10 MG tablet Take 1 tablet (10 mg total) by mouth at bedtime. 02/24/22   [name], MD  Cholecalciferol (VITAMIN D3 PO) Take by mouth daily.    [provider]  Continuous Blood Gluc Sensor (FREESTYLE LIBRE 2 SENSOR) MISC . Marland Kitchen change sensor every 14 days on insulin pump for 90 days 02/04/21   [provider]  empagliflozin (JARDIANCE) 10 MG TABS tablet 1 tablet Orally Once a day Patient not taking: Reported on 09/22/2022 11/11/20   [provider]  fluticasone (FLONASE) 50 MCG/ACT nasal spray Place 2 sprays into both nostrils daily. Patient not taking: Reported on 09/22/2022 02/24/22   [name], MD  HUMALOG 100 UNIT/ML injection INJ UP TO 65 UNI [place] VIA  INSULIN PUMP QD 05/06/18   [provider]  HYDROcodone-acetaminophen (NORCO) 5-325 MG tablet Take 1 tablet by mouth every 6 (six) hours as needed for moderate pain. Patient not taking: Reported on 09/22/2022 08/20/21   Edwin Dada P, DO  hydrOXYzine (VISTARIL) 25 MG capsule Take 1 capsule (25 mg total) by mouth at bedtime as needed (Insomnia). Patient not taking: Reported on 09/22/2022 10/06/21   [name], MD  insulin aspart (NOVOLOG) 100 UNIT/ML FlexPen Inject 1 Units into the skin 3 (three) times daily with meals. 1 unit per hour    [provider]  insulin glargine (LANTUS) 100  UNIT/ML injection 16 units Subcutaneous once a day if unable to use pump for 90 days    [provider]  KRILL OIL 1000 MG CAPS Take 1,000 mg by mouth daily.    [provider]  Lancets (ONETOUCH ULTRASOFT) lancets FOR USE WHEN CHECKING BLOOD SUGAR QID. 05/20/16   [provider]  loratadine (CLARITIN) 10 MG tablet 1 tablet Orally Once a day Patient not taking: Reported on 09/22/2022    [provider]  Misc Natural Products (TART CHERRY ADVANCED PO) Take by mouth daily.    [provider]  Multiple Vitamin (MULTIVITAMIN WITH MINERALS) TABS Take 1 tablet by mouth daily.    [provider]  Multiple Vitamins-Minerals (ZINC PO) Take by mouth daily. Patient not taking: Reported on 09/22/2022    [provider]  ofloxacin (FLOXIN) 0.3 % OTIC solution Place 5 drops into the left ear daily. 06/27/22   [name], MD  ONE TOUCH ULTRA TEST test strip USE WHEN CHECKING BLOOD SUGAR QID. 05/26/16   [provider]  pantoprazole (PROTONIX) 40 MG tablet TAKE 1 TABLET DAILY. NEED OFFICE VISIT AND LABS BEFORE FURTHER REFILLS 10/08/18   Donita Brooks, MD  potassium chloride (KLOR-CON) 10 MEQ tablet Take by mouth. Patient not taking: Reported on 09/22/2022 05/19/21   [provider]  POTASSIUM PO Take by mouth daily.    [provider]  pravastatin (PRAVACHOL) 40 MG tablet TAKE 1 TABLET EVERY EVENING 08/03/22   [name], MD  Pyridoxine HCl (VITAMIN B-6 PO) Take by mouth daily.    [provider]  ramipril (ALTACE) 5 MG capsule Take 1 capsule (5 mg total) by mouth daily. 10/06/21   [name], MD  tizanidine (ZANAFLEX) 2 MG capsule Take 1 capsule (2 mg total) by mouth 3 (three) times daily. Patient not taking: Reported on 09/22/2022 06/13/21   Kirtland Bouchard, PA-C  vitamin E 400 UNIT capsule Take 400 Units by mouth daily.    [provider]    Family History    Family History  Problem Relation Age of Onset   Cancer Father        prostate   He indicated that his mother is deceased. He indicated that his father is deceased. He indicated that his sister is alive. He indicated that three of his four brothers are alive.  Social History    Social History   Socioeconomic History   Marital status: Married    Spouse name: Joel Jones   Number of children: 3   Years of education: College   Highest education level: Not on file  Occupational History    Employer: UNEMPLOYED  Tobacco Use   Smoking status: Former    Packs/day: 1.00    Years: 5.00    Additional pack years: 0.00    Total pack years: 5.00    Types: Cigarettes, Cigars    Quit date: 05/15/1968    Years since quitting: 54.4    Passive exposure: Never   Smokeless tobacco: Never  Vaping Use   Vaping Use: Never used  Substance and Sexual Activity   Alcohol use: No    Alcohol/week: 0.0 standard drinks of alcohol   Drug use: Never   Sexual activity: Yes  Other Topics Concern   Not on file  Social History Narrative   Patient lives at home with family.   Caffeine Use: occasionally   Social Determinants of Health   Financial Resource Strain: Not on file  Food Insecurity: Not on file  Transportation Needs: Not on file  Physical Activity: Not on file  Stress: Not on file  Social Connections: Not on file  Intimate  Partner Violence: Not on file     Review of Systems    General:  No chills, fever, night sweats or weight changes.  Cardiovascular:  No chest pain, dyspnea on exertion, edema, orthopnea, palpitations, paroxysmal nocturnal dyspnea. Dermatological: No rash, lesions/masses Respiratory: No cough, dyspnea Urologic: No hematuria, dysuria Abdominal:   No nausea, vomiting, diarrhea, bright red blood per rectum, melena, or hematemesis Neurologic:  No visual changes, wkns, changes in mental status. All other systems reviewed and are otherwise negative except as noted above.  Physical Exam    VS:  BP 112/60 (BP Location: Left Arm, Patient Position: Sitting, Cuff Size: Large)   Pulse 87   Ht 5\' 6"  (1.676 m)   Wt 209 lb 3.2 oz (94.9 kg)   SpO2 97%   BMI 33.77 kg/m  , BMI Body mass index is 33.77 kg/m. GEN: Well nourished, well developed, in no acute distress. HEENT: normal. Neck: Supple, no JVD, carotid bruits, or masses. Cardiac: RRR, no murmurs, rubs, or gallops. No clubbing, cyanosis, edema.  Radials/DP/PT 2+ and equal bilaterally.  Respiratory:  Respirations regular and unlabored, clear to auscultation bilaterally. GI: Soft, nontender, nondistended, BS + x 4. MS: no deformity or atrophy. Skin: warm and dry, no rash. Neuro:  Strength and sensation are intact. Psych: Normal affect.  Accessory Clinical Findings    Recent Labs: 09/20/2022: ALT 28; BUN 16; Creatinine, Ser 1.13; Hemoglobin 14.2; Platelets 242; Potassium 3.7; Sodium 136   Recent Lipid Panel    Component Value Date/Time   CHOL 120 05/20/2019 1102   TRIG 41 05/20/2019 1102   HDL 41 05/20/2019 1102   CHOLHDL 2.9 05/20/2019 1102   CHOLHDL 3.6 01/23/2019 0811   VLDL 10 01/23/2019 0811   LDLCALC 69 05/20/2019 1102         ECG personally reviewed by me today-atrial fibrillation nonspecific T wave abnormality 65 bpm- No acute changes  EKG 09/22/2022 atrial fibrillation with premature aberrantly conducted complexes  incomplete right bundle branch block 77 bpm  Echocardiogram 06/20/2017    -------------------------------------------------------------------  LV EF: 60% -   65%   -------------------------------------------------------------------  Indications:     CAD (I25.10).   -------------------------------------------------------------------  History:  PMH:  Hyperlipidemia.  Risk factors:  Hypertension.  Diabetes mellitus.   -------------------------------------------------------------------  Study Conclusions   - Left ventricle: The cavity size was normal. Wall thickness was    increased in a pattern of mild LVH. Systolic function was normal.    The estimated ejection fraction was in the range of 60% to 65%.    Wall motion was normal; there were no regional wall motion    abnormalities. Doppler parameters are consistent with abnormal    left ventricular relaxation (grade 1 diastolic dysfunction).  - Aortic valve: There was no stenosis.  - Mitral valve: There was no significant regurgitation.  - Left atrium: The atrium was mildly dilated.  - Right ventricle: The cavity size was normal. Systolic function    was normal.  - Tricuspid valve: Peak RV-RA gradient (S): 31 mm Hg.  - Pulmonary arteries: PA peak pressure: 34 mm Hg (S).  - Inferior vena cava: The vessel was normal in size. The    respirophasic diameter changes were in the normal range (= 50%),    consistent with normal central venous pressure.   Impressions:   -  Normal LV size with mild LV hypertrophy. EF 60-65%. Normal RV    size and systolic function. No significant valvular    abnormalities.   -------------------------------------------------------------------  Study data:  No prior study was available for comparison.  Study  status:  Routine.  Procedure:  The patient reported no pain pre or  post test. Transthoracic echocardiography. Image quality was  adequate.          Transthoracic echocardiography.  M-mode,  complete  2D, spectral Doppler, and color Doppler.  Birthdate:  Patient birthdate: 1946-03-23.  Age:  Patient is 77 yr old.  Sex:  Gender: male.    BMI: 32.6 kg/m^2.  Blood pressure:     116/68  Patient status:  Outpatient.  Study date:  Study date: 06/20/2017.  Study time: 10:42 AM.  Location:  [facility]   -------------------------------------------------------------------   -------------------------------------------------------------------  Left ventricle:  The cavity size was normal. Wall thickness was  increased in a pattern of mild LVH. Systolic function was normal.  The estimated ejection fraction was in the range of 60% to 65%.  Wall motion was normal; there were no regional wall motion  abnormalities. Doppler parameters are consistent with abnormal left  ventricular relaxation (grade 1 diastolic dysfunction).   -------------------------------------------------------------------  Aortic valve:   Trileaflet.  Doppler:   There was no stenosis.  There was no regurgitation.   -------------------------------------------------------------------  Aorta: Aortic root: The aortic root was normal in size.  Ascending aorta: The ascending aorta was normal in size.   -------------------------------------------------------------------  Mitral valve:   Normal thickness leaflets .  Doppler:   There was  no evidence for stenosis.   There was no significant regurgitation.    -------------------------------------------------------------------  Left atrium:  The atrium was mildly dilated.   -------------------------------------------------------------------  Right ventricle:  The cavity size was normal. Systolic function was  normal.   -------------------------------------------------------------------  Pulmonic valve:    Structurally normal valve.   Cusp separation was  normal.  Doppler:  Transvalvular velocity was within the normal  range. There was trivial regurgitation.    -------------------------------------------------------------------  Tricuspid valve:   Doppler:  There was trivial regurgitation.    -------------------------------------------------------------------  Right atrium:  The atrium was normal in size.   -------------------------------------------------------------------  Pericardium: There was no pericardial effusion.   -------------------------------------------------------------------  Systemic veins:  Inferior vena cava: The vessel was normal in size. The  respirophasic diameter changes were in the normal range (= 50%),  consistent with normal central venous pressure. Diameter: 18 mm.    nuclear stress test 08/20/2002 Low risk.  There was some concern for balanced ischemia.  Assessment & Plan   1.  Atrial fibrilation-reports compliance with apixaban.  Denies bleeding issues.  Denies missed doses.  EKG today shows atrial fibrillation  65 bpm.  Asymptomatic.  Remains very physically active.  Denies lightheadedness, presyncope or syncope.This patients CHA2DS2-VASc Score and unadjusted Ischemic Stroke Rate (% per year) is equal to 4.8 % stroke rate/year from a score of 4 Above score calculated as 1 point each if present [HTN,  Vascular=MI/PAD/Aortic Plaque, Age x 2 Continue apixaban 5 mg twice daily Schedule DCCV Maintain physical activity Case reviewed with DOD.   Shared Decision Making/Informed Consent  The risks (stroke, cardiac arrhythmias rarely resulting in the need for a temporary or permanent pacemaker, skin irritation or burns and complications associated with conscious sedation including aspiration, arrhythmia, respiratory failure and death), benefits (restoration of normal sinus rhythm) and alternatives of a direct current cardioversion were explained  in detail to Mr. Nickerson and he agrees to proceed.     Essential hypertension-BP today112/60. Maintain blood pressure log Continue current medical therapy Low salt diet- Salty 6  given    Hyperlipidemia-LDL 71 on 04/18/22. Continue pravastatin High-fiber diet Maintain physical activity Repeat fasting lipids and LFTs- 12/24  Type 2 diabetes-compliant with medical therapy and diet. Carb modified diet Continue current medical therapy Follows with PCP   Disposition: Follow-up with Dr. Jens [name] or me after DCCV.   Thomasene Ripple. Rolan Wrightsman NP-C     10/23/2022, 11:08 AM [facility] [address] Office [phone] Fax [phone]    I spent 14 minutes examining this patient, reviewing medications, and using patient centered shared decision making involving her cardiac care.  Prior to her visit I spent greater than 20 minutes reviewing her past medical history,  medications, and prior cardiac tests.

## 2022-10-22 NOTE — Progress Notes (Unsigned)
Cardiology Clinic Note   Patient Name: Joel Maharrey Ritson Sr. Date of Encounter: 10/22/2022  Primary Care Provider:  [name], MD Primary Cardiologist:  Olga Millers, MD  Patient Profile    Joel Cower Geffrard Sr. 77 year old male presents the clinic today for follow-up evaluation of his atrial fibrillation.  Past Medical History    Past Medical History:  Diagnosis Date   AKI (acute kidney injury) (HCC) 05/18/2021   Anemia    Arthritis    "all over"   Bradycardia    Chronic bronchitis (HCC)    "get it just about q yr" (03/17/2014)   Daily headache    "here lately" (03/17/2014); relates to sinuses   GERD (gastroesophageal reflux disease)    Hard of hearing    hearing aides bilat   Hiatal hernia    History of blood transfusion 2008   "related to OR"   History of kidney stones    Hyperlipidemia    Pneumonia 1972 X 1   Rheumatoid arthritis (HCC)    Type II diabetes mellitus (HCC)    Wears glasses    Past Surgical History:  Procedure Laterality Date   broken finger      surgical repaired left hand 2nd finger    cyst removed      per left knee/posteriorly   CYSTECTOMY Left 2009   "behind knee"   INGUINAL HERNIA REPAIR Right ?2010   JOINT REPLACEMENT     KNEE ARTHROSCOPY Bilateral 1980's   PARTIAL KNEE ARTHROPLASTY Right 08/27/2015   Procedure: RIGHT UNICOMPARTMENTAL KNEE ARTHROPLASTY;  Surgeon: Kathryne Hitch, MD;  Location: [facility];  Service: Orthopedics;  Laterality: Right;   REVISION TOTAL KNEE ARTHROPLASTY Left 2008   SHOULDER SURGERY Left 2019   TOTAL KNEE ARTHROPLASTY Left 2003    Allergies  Allergies  Allergen Reactions   Codeine Swelling   Lipitor [Atorvastatin] Other (See Comments)    Muscle aches    Invokana [Canagliflozin] Other (See Comments)    Stomach upset    Losartan Nausea And Vomiting   Roxicodone [Oxycodone] Itching   Vicodin [Hydrocodone-Acetaminophen] Itching    History of Present Illness    Joel Muhle Romaniello Sr.  has a PMH of  insulin-dependent type 2 diabetes, osteoarthritis, HLD, primary insomnia, obesity, coronary artery disease, sinus bradycardia, and HTN.  He had nuclear stress test in 2004 which was normal.  His echocardiogram 2/19 showed an EF of 60 to 65%.   He was seen in follow-up by Corine Shelter, PA-C on 11/20/2019.  He denied chest pain and shortness of breath.  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  He was following with Dr. Allayne Stack for his arthritis and doing well.  He denied presyncope, syncope and unusual weakness.   He was lost to follow-up.  He presented to the clinic 09/22/22 for follow-up evaluation and stated he had noticed some increased fatigue which he attributed to age.  He had returned to work part-time.  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  He presented  with his wife.  His EKG showed new atrial fibrillation rate controlled at 77 bpm.  We reviewed stroke risk.  They expressed understanding.  I  started him on apixaban 5 mg twice daily and planned return to the clinic in 3 weeks for follow-up evaluation.  He was cardiac unaware.   He presents to the  clinic today for follow-up evaluation and states***.     Today denies chest pain, shortness of breath, lower extremity edema, palpitations, melena, hematuria, hemoptysis, diaphoresis, weakness, presyncope, syncope, orthopnea, and PND.  Atrial fibrilation-reports compliance with apixaban.  Denies bleeding issues.  Denies missed doses.  EKG today shows***atrial fibrillation with prematurely aberrantly conducted complexes incomplete right bundle branch block 77 bpm.  Asymptomatic.  Remains very physically active.  Denies lightheadedness, presyncope or syncope.This patients CHA2DS2-VASc Score and unadjusted Ischemic Stroke Rate (% per year) is equal to 4.8 % stroke rate/year from a score of 4 Above score calculated as 1 point each if present  [HTN,  Vascular=MI/PAD/Aortic Plaque, Age x 2 Continue apixaban 5 mg twice daily Schedule DCCV Maintain physical activity   Essential hypertension-BP today***112/76. Maintain blood pressure log Continue current medical therapy Low salt diet- Salty 6 given    Hyperlipidemia-LDL 71 on 04/18/22. Continue pravastatin High-fiber diet Maintain physical activity Repeat fasting lipids and LFTs- 12/24  Type 2 diabetes-compliant with medical therapy and diet. Carb modified diet Continue current medical therapy Follows with PCP   Disposition: Follow-up with Dr. Jens [name] or me after DCCV.  Home Medications    Prior to Admission medications   Medication Sig Start Date End Date Taking? Authorizing Provider  Abatacept (ORENCIA IV) Inject 750 mg into the vein every 28 (twenty-eight) days.    [provider]  albuterol (VENTOLIN HFA) 108 (90 Base) MCG/ACT inhaler Inhale 2 puffs into the lungs every 6 (six) hours as needed for wheezing or shortness of breath. Patient not taking: Reported on 09/22/2022 01/23/22   [name], MD  amLODipine (NORVASC) 5 MG tablet Take 1 tablet (5 mg total) by mouth daily. 10/06/21   [name], MD  apixaban (ELIQUIS) 5 MG TABS tablet Take 1 tablet (5 mg total) by mouth 2 (two) times daily. 10/03/22   Lewayne Bunting, MD  apixaban (ELIQUIS) 5 MG TABS tablet Take 1 tablet (5 mg total) by mouth 2 (two) times daily. 10/16/22   Lewayne Bunting, MD  Ascorbic Acid (VITAMIN C PO) Take by mouth daily. Patient not taking: Reported on 09/22/2022    [provider]  Ascorbic Acid (VITAMIN C) 500 MG CHEW 1 tablet Orally daily    [provider]  aspirin EC 81 MG tablet Take 81 mg by mouth daily.    [provider]  Calcium Carbonate-Vit D-Min (CALCIUM 1200 PO) Take by mouth daily.    [provider]  cetirizine (ZYRTEC) 10 MG tablet Take 1 tablet (10 mg total) by mouth at bedtime. 02/24/22   [name], MD  Cholecalciferol  (VITAMIN D3 PO) Take by mouth daily.    [provider]  Continuous Blood Gluc Sensor (FREESTYLE LIBRE 2 SENSOR) MISC . Marland Kitchen change sensor every 14 days on insulin pump for 90 days 02/04/21   [provider]  empagliflozin (JARDIANCE) 10 MG TABS tablet 1 tablet Orally Once a day Patient not taking: Reported on 09/22/2022 11/11/20   [provider]  fluticasone (FLONASE) 50 MCG/ACT nasal spray Place 2 sprays into both nostrils daily. Patient not taking: Reported on 09/22/2022 02/24/22   [name], MD  HUMALOG 100 UNIT/ML injection INJ UP TO 65 UNI [place] VIA INSULIN PUMP QD 05/06/18   [provider]  HYDROcodone-acetaminophen (NORCO) 5-325 MG tablet Take 1 tablet by mouth every 6 (six) hours as needed for moderate pain. Patient not taking: Reported on 09/22/2022 08/20/21   Franne Forts, DO  hydrOXYzine (VISTARIL) 25 MG capsule Take 1 capsule (25 mg total) by mouth at bedtime as needed (Insomnia). Patient not taking: Reported on 09/22/2022 10/06/21   [name], MD  insulin aspart (NOVOLOG) 100 UNIT/ML FlexPen Inject 1 Units into the skin 3 (three) times daily with meals. 1 unit per hour    [provider]  insulin glargine (LANTUS) 100 UNIT/ML injection 16 units Subcutaneous once a day if unable to use pump for 90 days    [provider]  KRILL OIL 1000 MG CAPS Take 1,000 mg by mouth daily.    [provider]  Lancets (ONETOUCH ULTRASOFT) lancets FOR USE WHEN CHECKING BLOOD SUGAR QID. 05/20/16   [provider]  loratadine (CLARITIN) 10 MG tablet 1 tablet Orally Once a day Patient not taking: Reported on 09/22/2022    [provider]  Misc Natural Products (TART CHERRY ADVANCED PO) Take by mouth daily.    [provider]  Multiple Vitamin (MULTIVITAMIN WITH MINERALS) TABS Take 1 tablet by mouth daily.    [provider]  Multiple Vitamins-Minerals (ZINC PO) Take by mouth daily. Patient not taking:  Reported on 09/22/2022    [provider]  ofloxacin (FLOXIN) 0.3 % OTIC solution Place 5 drops into the left ear daily. 06/27/22   [name], MD  ONE TOUCH ULTRA TEST test strip USE WHEN CHECKING BLOOD SUGAR QID. 05/26/16   [provider]  pantoprazole (PROTONIX) 40 MG tablet TAKE 1 TABLET DAILY. NEED OFFICE VISIT AND LABS BEFORE FURTHER REFILLS 10/08/18   Donita Brooks, MD  potassium chloride (KLOR-CON) 10 MEQ tablet Take by mouth. Patient not taking: Reported on 09/22/2022 05/19/21   [provider]  POTASSIUM PO Take by mouth daily.    [provider]  pravastatin (PRAVACHOL) 40 MG tablet TAKE 1 TABLET EVERY EVENING 08/03/22   [name], MD  Pyridoxine HCl (VITAMIN B-6 PO) Take by mouth daily.    [provider]  ramipril (ALTACE) 5 MG capsule Take 1 capsule (5 mg total) by mouth daily. 10/06/21   [name], MD  tizanidine (ZANAFLEX) 2 MG capsule Take 1 capsule (2 mg total) by mouth 3 (three) times daily. Patient not taking: Reported on 09/22/2022 06/13/21   Kirtland Bouchard, PA-C  vitamin E 400 UNIT capsule Take 400 Units by mouth daily.    [provider]    Family History    Family History  Problem Relation Age of Onset   Cancer Father        prostate   He indicated that his mother is deceased. He indicated that his father is deceased. He indicated that his sister is alive. He indicated that three of his four brothers are alive.  Social History    Social History   Socioeconomic History   Marital status: Married    Spouse name: Genevie Cheshire   Number of children: 3   Years of education: College   Highest education level: Not on file  Occupational History    Employer: UNEMPLOYED  Tobacco Use   Smoking status: Former    Packs/day: 1.00    Years: 5.00    Additional pack years: 0.00    Total pack years: 5.00    Types: Cigarettes, Cigars    Quit date: 05/15/1968    Years since quitting: 54.4    Passive exposure:  Never   Smokeless tobacco: Never  Vaping Use   Vaping Use: Never used  Substance and Sexual  Activity   Alcohol use: No    Alcohol/week: 0.0 standard drinks of alcohol   Drug use: Never   Sexual activity: Yes  Other Topics Concern   Not on file  Social History Narrative   Patient lives at home with family.   Caffeine Use: occasionally   Social Determinants of Health   Financial Resource Strain: Not on file  Food Insecurity: Not on file  Transportation Needs: Not on file  Physical Activity: Not on file  Stress: Not on file  Social Connections: Not on file  Intimate Partner Violence: Not on file     Review of Systems    General:  No chills, fever, night sweats or weight changes.  Cardiovascular:  No chest pain, dyspnea on exertion, edema, orthopnea, palpitations, paroxysmal nocturnal dyspnea. Dermatological: No rash, lesions/masses Respiratory: No cough, dyspnea Urologic: No hematuria, dysuria Abdominal:   No nausea, vomiting, diarrhea, bright red blood per rectum, melena, or hematemesis Neurologic:  No visual changes, wkns, changes in mental status. All other systems reviewed and are otherwise negative except as noted above.  Physical Exam    VS:  There were no vitals taken for this visit. , BMI There is no height or weight on file to calculate BMI. GEN: Well nourished, well developed, in no acute distress. HEENT: normal. Neck: Supple, no JVD, carotid bruits, or masses. Cardiac: RRR, no murmurs, rubs, or gallops. No clubbing, cyanosis, edema.  Radials/DP/PT 2+ and equal bilaterally.  Respiratory:  Respirations regular and unlabored, clear to auscultation bilaterally. GI: Soft, nontender, nondistended, BS + x 4. MS: no deformity or atrophy. Skin: warm and dry, no rash. Neuro:  Strength and sensation are intact. Psych: Normal affect.  Accessory Clinical Findings    Recent Labs: 09/20/2022: ALT 28; BUN 16; Creatinine, Ser 1.13; Hemoglobin 14.2; Platelets 242; Potassium  3.7; Sodium 136   Recent Lipid Panel    Component Value Date/Time   CHOL 120 05/20/2019 1102   TRIG 41 05/20/2019 1102   HDL 41 05/20/2019 1102   CHOLHDL 2.9 05/20/2019 1102   CHOLHDL 3.6 01/23/2019 0811   VLDL 10 01/23/2019 0811   LDLCALC 69 05/20/2019 1102    No BP recorded.  {Refresh Note OR Click here to enter BP  :1}***    ECG personally reviewed by me today- *** - No acute changes  EKG 09/22/2022 atrial fibrillation with premature aberrantly conducted complexes incomplete right bundle branch block 77 bpm  Echocardiogram 06/20/2017    -------------------------------------------------------------------  LV EF: 60% -   65%   -------------------------------------------------------------------  Indications:     CAD (I25.10).   -------------------------------------------------------------------  History:  PMH:  Hyperlipidemia.  Risk factors:  Hypertension.  Diabetes mellitus.   -------------------------------------------------------------------  Study Conclusions   - Left ventricle: The cavity size was normal. Wall thickness was    increased in a pattern of mild LVH. Systolic function was normal.    The estimated ejection fraction was in the range of 60% to 65%.    Wall motion was normal; there were no regional wall motion    abnormalities. Doppler parameters are consistent with abnormal    left ventricular relaxation (grade 1 diastolic dysfunction).  - Aortic valve: There was no stenosis.  - Mitral valve: There was no significant regurgitation.  - Left atrium: The atrium was mildly dilated.  - Right ventricle: The cavity size was normal. Systolic function    was normal.  - Tricuspid valve: Peak RV-RA gradient (S): 31 mm Hg.  - Pulmonary arteries: PA peak  pressure: 34 mm Hg (S).  - Inferior vena cava: The vessel was normal in size. The    respirophasic diameter changes were in the normal range (= 50%),    consistent with normal central venous pressure.    Impressions:   - Normal LV size with mild LV hypertrophy. EF 60-65%. Normal RV    size and systolic function. No significant valvular    abnormalities.   -------------------------------------------------------------------  Study data:  No prior study was available for comparison.  Study  status:  Routine.  Procedure:  The patient reported no pain pre or  post test. Transthoracic echocardiography. Image quality was  adequate.          Transthoracic echocardiography.  M-mode,  complete 2D, spectral Doppler, and color Doppler.  Birthdate:  Patient birthdate: Jun 20, 1945.  Age:  Patient is 77 yr old.  Sex:  Gender: male.    BMI: 32.6 kg/m^2.  Blood pressure:     116/68  Patient status:  Outpatient.  Study date:  Study date: 06/20/2017.  Study time: 10:42 AM.  Location:  [place] [facility]   -------------------------------------------------------------------   -------------------------------------------------------------------  Left ventricle:  The cavity size was normal. Wall thickness was  increased in a pattern of mild LVH. Systolic function was normal.  The estimated ejection fraction was in the range of 60% to 65%.  Wall motion was normal; there were no regional wall motion  abnormalities. Doppler parameters are consistent with abnormal left  ventricular relaxation (grade 1 diastolic dysfunction).   -------------------------------------------------------------------  Aortic valve:   Trileaflet.  Doppler:   There was no stenosis.  There was no regurgitation.   -------------------------------------------------------------------  Aorta: Aortic root: The aortic root was normal in size.  Ascending aorta: The ascending aorta was normal in size.   -------------------------------------------------------------------  Mitral valve:   Normal thickness leaflets .  Doppler:   There was  no evidence for stenosis.   There was no significant regurgitation.     -------------------------------------------------------------------  Left atrium:  The atrium was mildly dilated.   -------------------------------------------------------------------  Right ventricle:  The cavity size was normal. Systolic function was  normal.   -------------------------------------------------------------------  Pulmonic valve:    Structurally normal valve.   Cusp separation was  normal.  Doppler:  Transvalvular velocity was within the normal  range. There was trivial regurgitation.   -------------------------------------------------------------------  Tricuspid valve:   Doppler:  There was trivial regurgitation.    -------------------------------------------------------------------  Right atrium:  The atrium was normal in size.   -------------------------------------------------------------------  Pericardium: There was no pericardial effusion.   -------------------------------------------------------------------  Systemic veins:  Inferior vena cava: The vessel was normal in size. The  respirophasic diameter changes were in the normal range (= 50%),  consistent with normal central venous pressure. Diameter: 18 mm.    nuclear stress test 08/20/2002 Low risk.  There was some concern for balanced ischemia.  Assessment & Plan   1.  ***   Thomasene Ripple. Nalaya Wojdyla NP-C     10/22/2022, 10:17 AM [facility] [address] Office [phone] Fax [phone]    I spent***minutes examining this patient, reviewing medications, and using patient centered shared decision making involving her cardiac care.  Prior to her visit I spent greater than 20 minutes reviewing her past medical history,  medications, and prior cardiac tests.

## 2022-10-23 ENCOUNTER — Encounter: Payer: Self-pay | Admitting: General Practice

## 2022-10-23 ENCOUNTER — Ambulatory Visit: Payer: Medicare Other | Attending: General Practice | Admitting: General Practice

## 2022-10-23 VITALS — BP 112/60 | HR 87 | Ht 66.0 in | Wt 209.2 lb

## 2022-10-23 DIAGNOSIS — E782 Mixed hyperlipidemia: Secondary | ICD-10-CM | POA: Insufficient documentation

## 2022-10-23 DIAGNOSIS — I10 Essential (primary) hypertension: Secondary | ICD-10-CM | POA: Insufficient documentation

## 2022-10-23 DIAGNOSIS — Z794 Long term (current) use of insulin: Secondary | ICD-10-CM

## 2022-10-23 DIAGNOSIS — E1169 Type 2 diabetes mellitus with other specified complication: Secondary | ICD-10-CM | POA: Insufficient documentation

## 2022-10-23 DIAGNOSIS — I4891 Unspecified atrial fibrillation: Secondary | ICD-10-CM | POA: Insufficient documentation

## 2022-10-23 NOTE — Patient Instructions (Signed)
Medication Instructions:  The current medical regimen is effective;  continue present plan and medications as directed. Please refer to the Current Medication list given to you today.  *If you need a refill on your cardiac medications before your next appointment, please call your pharmacy*  Lab Work: BMET AND CBC THE WEEK BEFORE CARDIOVERSION If you have labs (blood work) drawn today and your tests are completely normal, you will receive your results only by: MyChart Message (if you have MyChart) OR A paper copy in the mail If you have any lab test that is abnormal or we need to change your treatment, we will call you to review the results.  Testing/Procedures: Echocardiogram - Your physician has requested that you have an echocardiogram. Echocardiography is a painless test that uses sound waves to create images of your heart. It provides your doctor with information about the size and shape of your heart and how well your heart's chambers and valves are working. This procedure takes approximately one hour. There are no restrictions for this procedure.    CARDIOVERSION-SEE BELOW [facility] 11-08-2022  Follow-Up: At [facility], you and your health needs are our priority.  As part of our continuing mission to provide you with exceptional heart care, we have created designated Provider Care Teams.  These Care Teams include your primary Cardiologist (physician) and Advanced Practice Providers (APPs -  Physician Assistants and Nurse Practitioners) who all work together to provide you with the care you need, when you need it.  Your next appointment:   AFTER TESTING   Provider:   Olga Millers, MD  or Edd Fabian, FNP        Other Instructions        Dear Joel Cower Litt Sr.  You are scheduled for a Cardioversion on Wednesday, June 26 with Dr. Carolan Clines, MD.  Please arrive at the [facility] (Main Entrance A) at [facility]: [address] [place], [place] [address] at  9:00 AM (This time is 1 hour(s) before your procedure to ensure your preparation). Free valet parking service is available. You will check in at ADMITTING. The support person will be asked to wait in the waiting room.  It is OK to have someone drop you off and come back when you are ready to be discharged.      DIET:  Nothing to eat or drink after midnight except a sip of water with medications (see medication instructions below)  MEDICATION INSTRUCTIONS: !!IF ANY NEW MEDICATIONS ARE STARTED AFTER TODAY, PLEASE NOTIFY YOUR PROVIDER AS SOON AS POSSIBLE!!  FYI: Medications such as Semaglutide (Ozempic, [place]), Tirzepatide (Mounjaro, Zepbound), Dulaglutide (Trulicity), etc ("GLP1 agonists") AND Canagliflozin (Invokana), Dapagliflozin (Farxiga), Empagliflozin (Jardiance), Ertugliflozin ([place]atro), Bexagliflozin [employer]) or any combination with one of these drugs such as Invokamet (Canagliflozin/Metformin), Synjardy (Empagliflozin/Metformin), etc ("SGLT2 inhibitors") must be held around the time of a procedure. This is not a comprehensive list of all of these drugs. Please review all of your medications and talk to your provider if you take any one of these. If you are not sure, ask your provider.   HOLD: Empagliflozin (Jardiance) for 3 days prior to the procedure. Last dose on SATURDAY, June 22.  HOLD SUNDAY, MONDAY,TUESDAY AND WEDNESDAY Continue taking your anticoagulant (blood thinner): Apixaban (Eliquis).  You will need to continue this after your procedure until you are told by your provider that it is safe to stop.    LABS:   Come to [facility] THE WEEK BEFORE YOUR  CARDIOVERSION  FYI:  For your safety, and to allow [place] to monitor your vital signs accurately during the surgery/procedure we request: If you have artificial nails, gel coating, SNS etc, please have those removed prior to your surgery/procedure. Not having the nail coverings /polish removed [place] result in cancellation or delay  of your surgery/procedure.  You must have a responsible person to drive you home and stay in the waiting area during your procedure. Failure to do so could result in cancellation.  Bring your insurance cards.  *Special Note: Every effort is made to have your procedure done on time. Occasionally there are emergencies that occur at the hospital that [place] cause delays. Please be patient if a delay does occur.

## 2022-10-26 ENCOUNTER — Ambulatory Visit: Payer: Medicare Other | Admitting: Internal Medicine

## 2022-11-06 DIAGNOSIS — I4891 Unspecified atrial fibrillation: Secondary | ICD-10-CM | POA: Diagnosis not present

## 2022-11-06 DIAGNOSIS — I10 Essential (primary) hypertension: Secondary | ICD-10-CM | POA: Diagnosis not present

## 2022-11-07 LAB — BASIC METABOLIC PANEL
BUN/Creatinine Ratio: 14 (ref 10–24)
BUN: 15 mg/dL (ref 8–27)
CO2: 25 mmol/L (ref 20–29)
Calcium: 8.8 mg/dL (ref 8.6–10.2)
Chloride: 100 mmol/L (ref 96–106)
Creatinine, Ser: 1.11 mg/dL (ref 0.76–1.27)
Glucose: 190 mg/dL — ABNORMAL HIGH (ref 70–99)
Potassium: 4.5 mmol/L (ref 3.5–5.2)
Sodium: 138 mmol/L (ref 134–144)
eGFR: 69 mL/min/{1.73_m2} (ref >59)

## 2022-11-07 LAB — CBC
Hematocrit: 41.5 % (ref 37.5–51.0)
Hemoglobin: 14.1 g/dL (ref 13.0–17.7)
MCH: 31.4 pg (ref 26.6–33.0)
MCHC: 34 g/dL (ref 31.5–35.7)
MCV: 92 fL (ref 79–97)
Platelets: 233 10*3/uL (ref 150–450)
RBC: 4.49 x10E6/uL (ref 4.14–5.80)
RDW: 12.4 % (ref 11.6–15.4)
WBC: 5.4 10*3/uL (ref 3.4–10.8)

## 2022-11-07 NOTE — Progress Notes (Signed)
Spoke to pt and instructed them to come at 0845 and to be NPO after 0000. Confirmed no missed doses of AC and instructed to take in AM with a small sip of water.   Confirmed that pt will have a ride home and someone to stay with them for 24 hours after the procedure. 

## 2022-11-08 ENCOUNTER — Ambulatory Visit (HOSPITAL_COMMUNITY): Payer: Medicare Other | Admitting: Certified Registered Nurse Anesthetist

## 2022-11-08 ENCOUNTER — Encounter (HOSPITAL_COMMUNITY)
Admission: RE | Disposition: A | Discharge status: Home / Self Care | Payer: Self-pay | Source: Home / Self Care | Attending: Internal Medicine

## 2022-11-08 ENCOUNTER — Encounter (HOSPITAL_COMMUNITY): Payer: Self-pay | Admitting: Internal Medicine

## 2022-11-08 ENCOUNTER — Ambulatory Visit (HOSPITAL_COMMUNITY)
Admission: RE | Admit: 2022-11-08 | Discharge: 2022-11-08 | Disposition: A | Discharge status: Home / Self Care | Payer: Medicare Other | Attending: Internal Medicine | Admitting: Internal Medicine

## 2022-11-08 ENCOUNTER — Other Ambulatory Visit: Payer: Self-pay

## 2022-11-08 ENCOUNTER — Ambulatory Visit (HOSPITAL_BASED_OUTPATIENT_CLINIC_OR_DEPARTMENT_OTHER): Payer: Medicare Other | Admitting: Certified Registered Nurse Anesthetist

## 2022-11-08 DIAGNOSIS — Z7901 Long term (current) use of anticoagulants: Secondary | ICD-10-CM | POA: Diagnosis not present

## 2022-11-08 DIAGNOSIS — I10 Essential (primary) hypertension: Secondary | ICD-10-CM | POA: Diagnosis not present

## 2022-11-08 DIAGNOSIS — I4891 Unspecified atrial fibrillation: Secondary | ICD-10-CM

## 2022-11-08 DIAGNOSIS — Z79899 Other long term (current) drug therapy: Secondary | ICD-10-CM | POA: Insufficient documentation

## 2022-11-08 DIAGNOSIS — E119 Type 2 diabetes mellitus without complications: Secondary | ICD-10-CM | POA: Diagnosis not present

## 2022-11-08 DIAGNOSIS — E785 Hyperlipidemia, unspecified: Secondary | ICD-10-CM | POA: Diagnosis not present

## 2022-11-08 DIAGNOSIS — I2510 Atherosclerotic heart disease of native coronary artery without angina pectoris: Secondary | ICD-10-CM | POA: Diagnosis not present

## 2022-11-08 DIAGNOSIS — Z87891 Personal history of nicotine dependence: Secondary | ICD-10-CM | POA: Diagnosis not present

## 2022-11-08 DIAGNOSIS — D649 Anemia, unspecified: Secondary | ICD-10-CM | POA: Diagnosis not present

## 2022-11-08 HISTORY — PX: CARDIOVERSION: SHX1299

## 2022-11-08 LAB — GLUCOSE, CAPILLARY: Glucose-Capillary: 166 mg/dL — ABNORMAL HIGH (ref 70–99)

## 2022-11-08 SURGERY — CARDIOVERSION
Anesthesia: General

## 2022-11-08 MED ORDER — LIDOCAINE 2% (20 MG/ML) 5 ML SYRINGE
INTRAMUSCULAR | Status: DC | PRN
  Administered 2022-11-08: 50 mg via INTRAVENOUS

## 2022-11-08 MED ORDER — SODIUM CHLORIDE 0.9 % IV SOLN: INTRAVENOUS | Status: DC

## 2022-11-08 MED ORDER — PROPOFOL 10 MG/ML IV BOLUS
INTRAVENOUS | Status: DC | PRN
  Administered 2022-11-08: 10 mg via INTRAVENOUS
  Administered 2022-11-08: 50 mg via INTRAVENOUS

## 2022-11-08 SURGICAL SUPPLY — 1 items: ELECT DEFIB PAD ADLT CADENCE (PAD) ×1 IMPLANT

## 2022-11-08 NOTE — Interval H&P Note (Signed)
History and Physical Interval Note:  11/08/2022 9:14 AM  Joel Cower Bejarano Sr.  has presented today for surgery, with the diagnosis of AFIB.  The various methods of treatment have been discussed with the patient and family. After consideration of risks, benefits and other options for treatment, the patient has consented to  Procedure(s): CARDIOVERSION (N/A) as a surgical intervention.  The patient's history has been reviewed, patient examined, no change in status, stable for surgery.  I have reviewed the patient's chart and labs.  Questions were answered to the patient's satisfaction.     Maisie Fus

## 2022-11-08 NOTE — CV Procedure (Signed)
Procedure: Electrical Cardioversion Indications:  Atrial Fibrillation  Procedure Details:  Consent: Risks of procedure as well as the alternatives and risks of each were explained to the (patient/caregiver).  Consent for procedure obtained.  Time Out: Verified patient identification, verified procedure, site/side was marked, verified correct patient position, special equipment/implants available, medications/allergies/relevent history reviewed, required imaging and test results available. PERFORMED.  Patient placed on cardiac monitor, pulse oximetry, supplemental oxygen as necessary.  Sedation given:  propofol Pacer pads placed anterior and posterior chest.  Cardioverted 1 time(s).  Cardioversion with synchronized biphasic 200J shock.  Evaluation: Findings: Post procedure EKG shows:  Sinus bradycardia Complications: None Patient did tolerate procedure well.  Time Spent Directly with the Patient:  10 minutes   Maisie Fus 11/08/2022, 9:44 AM

## 2022-11-08 NOTE — Transfer of Care (Signed)
Immediate Anesthesia Transfer of Care Note  Patient: Joel Geller Gamel Sr.  Procedure(s) Performed: CARDIOVERSION  Patient Location: PACU and Cath Lab  Anesthesia Type:General  Level of Consciousness: drowsy and responds to stimulation  Airway & Oxygen Therapy: Patient Spontanous Breathing and Patient connected to nasal cannula oxygen  Post-op Assessment: Report given to RN and Post -op Vital signs reviewed and stable  Post vital signs: Reviewed and stable  Last Vitals:  Vitals Value Taken Time  BP 114/102 0946  Temp    Pulse 63   Resp 20   SpO2 97% RA     Last Pain:  Vitals:   11/08/22 0847  TempSrc: Temporal         Complications: No notable events documented.

## 2022-11-09 ENCOUNTER — Encounter (HOSPITAL_COMMUNITY): Payer: Self-pay | Admitting: Internal Medicine

## 2022-11-10 ENCOUNTER — Other Ambulatory Visit: Payer: Self-pay

## 2022-11-10 DIAGNOSIS — I10 Essential (primary) hypertension: Secondary | ICD-10-CM

## 2022-11-10 MED ORDER — RAMIPRIL 5 MG PO CAPS
5.0000 mg | ORAL_CAPSULE | Freq: Every day | ORAL | 3 refills | Status: DC
Start: 2022-11-10 — End: 2023-08-28

## 2022-11-13 ENCOUNTER — Other Ambulatory Visit: Payer: Self-pay | Admitting: Internal Medicine

## 2022-11-13 DIAGNOSIS — I10 Essential (primary) hypertension: Secondary | ICD-10-CM

## 2022-11-14 ENCOUNTER — Encounter (HOSPITAL_COMMUNITY): Payer: Self-pay | Admitting: Internal Medicine

## 2022-11-14 NOTE — Anesthesia Postprocedure Evaluation (Signed)
Anesthesia Post Note  Patient: Joel Westland Lazarus Sr.  Procedure(s) Performed: CARDIOVERSION     Patient location during evaluation: Cath Lab Anesthesia Type: General Level of consciousness: awake and alert Pain management: pain level controlled Vital Signs Assessment: post-procedure vital signs reviewed and stable Respiratory status: spontaneous breathing, nonlabored ventilation and respiratory function stable Cardiovascular status: stable Postop Assessment: no apparent nausea or vomiting Anesthetic complications: no   No notable events documented.  Last Vitals:  Vitals:   11/08/22 1005 11/08/22 1010  BP: 115/74 117/76  Pulse: (!) 50 (!) 52  Resp: 15 20  Temp:    SpO2: 97% 95%    Last Pain:  Vitals:   11/08/22 1005  TempSrc:   PainSc: 0-No pain                 [name]

## 2022-11-14 NOTE — Anesthesia Preprocedure Evaluation (Signed)
Anesthesia Evaluation   Patient awake and Patient confused    Reviewed: Allergy & Precautions, NPO status , Patient's Chart, lab work & pertinent test results  Airway Mallampati: III  TM Distance: >3 FB Neck ROM: Full    Dental  (+) Edentulous Upper, Edentulous Lower, Dental Advisory Given   Pulmonary asthma , former smoker   breath sounds clear to auscultation       Cardiovascular hypertension, Pt. on medications + CAD  + dysrhythmias Atrial Fibrillation  Rhythm:Irregular  Stress ECHO 2013 Normal   Neuro/Psych  Headaches  Neuromuscular disease    GI/Hepatic hiatal hernia,GERD  Medicated,,  Endo/Other  diabetes, Type 2, Insulin Dependent    Renal/GU Renal disease     Musculoskeletal  (+) Arthritis ,    Abdominal   Peds  Hematology  (+) Blood dyscrasia, anemia   Anesthesia Other Findings   Reproductive/Obstetrics                              Anesthesia Physical Anesthesia Plan  ASA: 3  Anesthesia Plan: General   Post-op Pain Management: Minimal or no pain anticipated   Induction: Intravenous  PONV Risk Score and Plan: 2 and Treatment Nairn vary due to age or medical condition  Airway Management Planned: Natural Airway, Simple Face Mask, Nasal Cannula and Mask  Additional Equipment: None  Intra-op Plan:   Post-operative Plan:   Informed Consent: I have reviewed the patients History and Physical, chart, labs and discussed the procedure including the risks, benefits and alternatives for the proposed anesthesia with the patient or authorized representative who has indicated his/her understanding and acceptance.     Dental advisory given  Plan Discussed with: CRNA  Anesthesia Plan Comments: (Plan on spinal, GA if she refuses)         Anesthesia Quick Evaluation

## 2022-11-15 ENCOUNTER — Ambulatory Visit (HOSPITAL_COMMUNITY)
Admission: RE | Admit: 2022-11-15 | Discharge: 2022-11-15 | Disposition: A | Discharge status: Home / Self Care | Payer: Medicare Other | Source: Ambulatory Visit | Attending: Rheumatology | Admitting: Rheumatology

## 2022-11-15 DIAGNOSIS — M0579 Rheumatoid arthritis with rheumatoid factor of multiple sites without organ or systems involvement: Secondary | ICD-10-CM | POA: Insufficient documentation

## 2022-11-15 DIAGNOSIS — Z79899 Other long term (current) drug therapy: Secondary | ICD-10-CM | POA: Insufficient documentation

## 2022-11-15 LAB — COMPREHENSIVE METABOLIC PANEL
ALT: 35 U/L (ref 0–44)
AST: 33 U/L (ref 15–41)
Albumin: 3.4 g/dL — ABNORMAL LOW (ref 3.5–5.0)
Alkaline Phosphatase: 94 U/L (ref 38–126)
Anion gap: 9 (ref 5–15)
BUN: 17 mg/dL (ref 8–23)
CO2: 23 mmol/L (ref 22–32)
Calcium: 8.4 mg/dL — ABNORMAL LOW (ref 8.9–10.3)
Chloride: 104 mmol/L (ref 98–111)
Creatinine, Ser: 1.16 mg/dL (ref 0.61–1.24)
GFR, Estimated: >60 mL/min (ref >60)
Glucose, Bld: 136 mg/dL — ABNORMAL HIGH (ref 70–99)
Potassium: 3.5 mmol/L (ref 3.5–5.1)
Sodium: 136 mmol/L (ref 135–145)
Total Bilirubin: 0.4 mg/dL (ref 0.3–1.2)
Total Protein: 5.9 g/dL — ABNORMAL LOW (ref 6.5–8.1)

## 2022-11-15 LAB — CBC WITH DIFFERENTIAL/PLATELET
Abs Immature Granulocytes: 0.02 10*3/uL (ref 0.00–0.07)
Basophils Absolute: 0.1 10*3/uL (ref 0.0–0.1)
Basophils Relative: 2 %
Eosinophils Absolute: 0.2 10*3/uL (ref 0.0–0.5)
Eosinophils Relative: 4 %
HCT: 40.3 % (ref 39.0–52.0)
Hemoglobin: 13.6 g/dL (ref 13.0–17.0)
Immature Granulocytes: 0 %
Lymphocytes Relative: 40 %
Lymphs Abs: 2.2 10*3/uL (ref 0.7–4.0)
MCH: 30.6 pg (ref 26.0–34.0)
MCHC: 33.7 g/dL (ref 30.0–36.0)
MCV: 90.6 fL (ref 80.0–100.0)
Monocytes Absolute: 0.6 10*3/uL (ref 0.1–1.0)
Monocytes Relative: 11 %
Neutro Abs: 2.3 10*3/uL (ref 1.7–7.7)
Neutrophils Relative %: 43 %
Platelets: 238 10*3/uL (ref 150–400)
RBC: 4.45 MIL/uL (ref 4.22–5.81)
RDW: 13 % (ref 11.5–15.5)
WBC: 5.5 10*3/uL (ref 4.0–10.5)
nRBC: 0 % (ref 0.0–0.2)

## 2022-11-15 MED ORDER — DIPHENHYDRAMINE HCL 25 MG PO CAPS: 25.0000 mg | ORAL_CAPSULE | ORAL | Status: DC

## 2022-11-15 MED ORDER — SODIUM CHLORIDE 0.9 % IV SOLN
750.0000 mg | INTRAVENOUS | Status: DC
  Administered 2022-11-15: 750 mg via INTRAVENOUS
  Filled 2022-11-15: qty 30

## 2022-11-15 MED ORDER — ACETAMINOPHEN 325 MG PO TABS: 650.0000 mg | ORAL_TABLET | ORAL | Status: DC

## 2022-11-15 NOTE — Progress Notes (Signed)
CBC is normal.  Glucose is mildly elevated at 136.  Calcium is low due to low albumin.  Please forward results to his PCP.

## 2022-11-21 ENCOUNTER — Telehealth: Payer: Self-pay | Admitting: Cardiology

## 2022-11-21 MED ORDER — APIXABAN 5 MG PO TABS: 5.0000 mg | ORAL_TABLET | Freq: Two times a day (BID) | ORAL | 0 refills | Status: DC

## 2022-11-21 NOTE — Telephone Encounter (Signed)
Patient is returning call.  °

## 2022-11-21 NOTE — Telephone Encounter (Signed)
Gave patient information and she again ask if she can have samples to last until they discuss with the doctor.  They think they [place] do Warfain but wishes to discuss with the doctor first

## 2022-11-21 NOTE — Telephone Encounter (Signed)
Call to patient and advised that samples are available but will not be enough to last until patient appt.  Advised I would send message to the doctor to see his recommendations regarding medication or if able to miss any dose while waiting to discuss at appointment  Patient will have enough of medication, with the samples, to last through July 27 and his appointment is on the 30t h.  Please advise

## 2022-11-21 NOTE — Telephone Encounter (Signed)
If he was denied pt assistsance with Eliquis, other options he could change to are detailed below:  Xarelto: The drug company offers a program called Xarelto with Me Coverage Gap Support. It runs from April 1-December 31. If patients are in the coverage gap, they can get Xarelto for $89 (for 30 days) or $250 (for 90 days). They can call to enroll at: 888-XARELTO [phone])  Dabigatran: Dabigatran is the generic of Pradaxa and can be cheaper. If insurance doesn't cover it well, there is a GoodRx coupon for ~$69-75 per month which bypasses insurance.  Warfarin: Warfarin is the cheapest option but requires routine in-office lab monitoring. Sometimes the visits have a copay though.

## 2022-11-21 NOTE — Telephone Encounter (Signed)
Pt c/o medication issue:  1. Name of Medication:   apixaban (ELIQUIS) 5 MG TABS tablet    2. How are you currently taking this medication (dosage and times per day)?  Take 1 tablet (5 mg total) by mouth 2 (two) times daily.       3. Are you having a reaction (difficulty breathing--STAT)? No  4. What is your medication issue? Pt is requesting a callback regarding them not being able to afford this medication so he's been getting samples but would like to know is it possible to get more samples to last until he comes in for his office visit on 7/30. Please advise

## 2022-11-21 NOTE — Telephone Encounter (Signed)
Typically don't provide that many samples at once. Ok in this case but will need to make decision at upcoming appt to change to something else since he can't rely on samples indefinitely.

## 2022-11-21 NOTE — Telephone Encounter (Signed)
LM to please call office

## 2022-11-21 NOTE — Telephone Encounter (Signed)
Patient has done patient assistance paperwork but was denied. He has received samples while waiting.  Please advise if samples or change in medication.  Appt with Sherlean Foot on 12/12/22.  Please advise

## 2022-11-22 NOTE — Telephone Encounter (Signed)
Spoke with pt wife, aware I am going to bring samples from the [place] office to add to the ones we have and that will be enough until their appointment. The patient will come by Friday to get samples.

## 2022-11-24 ENCOUNTER — Other Ambulatory Visit: Payer: Self-pay

## 2022-11-24 MED ORDER — APIXABAN 5 MG PO TABS: 5.0000 mg | ORAL_TABLET | Freq: Two times a day (BID) | ORAL | 0 refills | Status: DC

## 2022-11-27 ENCOUNTER — Ambulatory Visit (HOSPITAL_COMMUNITY): Payer: Medicare Other | Attending: Cardiology

## 2022-11-27 DIAGNOSIS — I4891 Unspecified atrial fibrillation: Secondary | ICD-10-CM | POA: Diagnosis not present

## 2022-11-27 LAB — ECHOCARDIOGRAM COMPLETE: S' Lateral: 3.4 cm

## 2022-12-05 NOTE — Progress Notes (Unsigned)
Office Visit Note  Patient: Joel Meli Camarena Sr.             Date of Birth: Mar 22, 1946           MRN: [id number]             PCP: [name], MD Referring: [name], MD Visit Date: 12/19/2022 Occupation: @GUAROCC @  Subjective:  Medication monitoring   History of Present Illness: Joel Mooers Mukai Sr. is a 77 y.o. male with history of seropositive rheumatoid arthritis.  Patient remains on IV Orencia 750 mg infusions every 28 days.  He continues to tolerate Orencia without any side effects. He denies any gaps in therapy. He denies any recent or recurrent infections.  He states he has had some increased discomfort in the left shoulder at night as well as joint stiffness first thing in the morning. He remains active.    Activities of Daily Living:  Patient reports morning stiffness for 6 hours.   Patient Reports nocturnal pain.  Difficulty dressing/grooming: Denies Difficulty climbing stairs: Reports Difficulty getting out of chair: Denies Difficulty using hands for taps, buttons, cutlery, and/or writing: Reports  Review of Systems  Constitutional:  Positive for fatigue.  HENT:  Positive for mouth dryness. Negative for mouth sores.   Eyes:  Positive for dryness.  Respiratory:  Positive for shortness of breath.   Cardiovascular:  Positive for palpitations.  Gastrointestinal:  Negative for blood in stool, constipation and diarrhea.  Endocrine: Negative for increased urination.  Genitourinary:  Negative for involuntary urination.  Musculoskeletal:  Positive for joint pain, joint pain, joint swelling, myalgias, muscle weakness, morning stiffness, muscle tenderness and myalgias. Negative for gait problem.  Skin:  Negative for color change, rash, hair loss and sensitivity to sunlight.  Allergic/Immunologic: Negative for susceptible to infections.  Neurological:  Positive for dizziness and headaches.  Hematological:  Negative for swollen glands.  Psychiatric/Behavioral:  Positive for  sleep disturbance. Negative for depressed mood. The patient is nervous/anxious.     PMFS History:  Patient Active Problem List   Diagnosis Date Noted   Atrial fibrillation, unspecified type (HCC) 12/12/2022   Encounter for therapeutic drug monitoring 12/12/2022   Acute mucoid otitis media of left ear 06/27/2022   Gastrocnemius muscle tear, left, subsequent encounter 06/27/2022   Allergic sinusitis 02/24/2022   Asthmatic bronchitis , chronic 01/24/2022   Primary insomnia 10/06/2021   Gastroesophageal reflux disease 05/18/2021   Type 2 diabetes mellitus with other specified complication (HCC) 05/18/2021   Long term (current) use of insulin (HCC) 05/18/2021   Obesity 05/18/2021   Elevated transaminase level 05/18/2021   Tear of left rotator cuff 09/24/2017   DDD (degenerative disc disease), cervical 10/31/2016   Primary osteoarthritis of both hands 10/31/2016   Primary osteoarthritis of both feet 10/31/2016   High risk medication use 07/06/2016   Rheumatoid arthritis involving multiple sites with positive rheumatoid factor (HCC) 06/02/2016   Contracture, elbow, left 06/02/2016   Osteoarthritis of right knee 08/27/2015   S/P TKR (total knee replacement), left 08/27/2015   Essential hypertension 05/11/2015   CAD (coronary artery disease) 04/02/2012   Sinus bradycardia    Hyperlipidemia     Past Medical History:  Diagnosis Date   A-fib (HCC)    AKI (acute kidney injury) (HCC) 05/18/2021   Anemia    Arthritis    "all over"   Bradycardia    Chronic bronchitis (HCC)    "get it just about q yr" (03/17/2014)   Daily headache    "  here lately" (03/17/2014); relates to sinuses   GERD (gastroesophageal reflux disease)    Hard of hearing    hearing aides bilat   Hiatal hernia    History of blood transfusion 2008   "related to OR"   History of kidney stones    Hyperlipidemia    Pneumonia 1972 X 1   Rheumatoid arthritis (HCC)    Type II diabetes mellitus (HCC)    Wears glasses      Family History  Problem Relation Age of Onset   Cancer Father        prostate   Past Surgical History:  Procedure Laterality Date   broken finger      surgical repaired left hand 2nd finger    CARDIOVERSION N/A 11/08/2022   Procedure: CARDIOVERSION;  Surgeon: Maisie Fus, MD;  Location: [facility];  Service: Cardiovascular;  Laterality: N/A;   cyst removed      per left knee/posteriorly   CYSTECTOMY Left 2009   "behind knee"   INGUINAL HERNIA REPAIR Right ?2010   JOINT REPLACEMENT     KNEE ARTHROSCOPY Bilateral 1980's   PARTIAL KNEE ARTHROPLASTY Right 08/27/2015   Procedure: RIGHT UNICOMPARTMENTAL KNEE ARTHROPLASTY;  Surgeon: Kathryne Hitch, MD;  Location: [facility];  Service: Orthopedics;  Laterality: Right;   REVISION TOTAL KNEE ARTHROPLASTY Left 2008   SHOULDER SURGERY Left 2019   TOTAL KNEE ARTHROPLASTY Left 2003   Social History   Social History Narrative   Patient lives at home with family.   Caffeine Use: occasionally   Immunization History  Administered Date(s) Administered   Pneumococcal Conjugate-13 01/13/2014   Pneumococcal Polysaccharide-23 05/15/2006, 09/16/2012, 03/29/2018     Objective: Vital Signs: BP 103/67 (BP Location: Right Arm, Patient Position: Sitting, Cuff Size: Normal)   Pulse 65   Resp 16   Ht 5\' 6"  (1.676 m)   Wt 209 lb (94.8 kg)   BMI 33.73 kg/m    Physical Exam Vitals and nursing note reviewed.  Constitutional:      Appearance: He is well-developed.  HENT:     Head: Normocephalic and atraumatic.  Eyes:     Conjunctiva/sclera: Conjunctivae normal.     Pupils: Pupils are equal, round, and reactive to light.  Cardiovascular:     Rate and Rhythm: Rhythm irregular.     Heart sounds: Normal heart sounds.  Pulmonary:     Effort: Pulmonary effort is normal.     Breath sounds: Normal breath sounds.  Abdominal:     General: Bowel sounds are normal.     Palpations: Abdomen is soft.  Musculoskeletal:     Cervical back:  Normal range of motion and neck supple.  Skin:    General: Skin is warm and dry.     Capillary Refill: Capillary refill takes less than 2 seconds.  Neurological:     Mental Status: He is alert and oriented to person, place, and time.  Psychiatric:        Behavior: Behavior normal.      Musculoskeletal Exam: C-spine has limited ROM with lateral rotation.  Painful ROM of the left shoulder.  Tenderness over the subacromial bursa of the left shoulder.  Elbow joints, wrist joints, MCPs, PIPs, and DIPs good ROM with no synovitis.  Hip joints have good ROM with no discomfort. Right partial knee replacement has painful ROM and slightly limited flexion.  Warmth of the right knee noted.   Ankle joints have good ROM with no tenderness or joint swelling.  CDAI Exam: CDAI Score: 3  Patient Global: 10 / 100; Provider Global: 10 / 100 Swollen: 0 ; Tender: 1  Joint Exam 12/19/2022      Right  Left  Glenohumeral      Tender     Investigation: No additional findings.  Imaging: ECHOCARDIOGRAM COMPLETE  Result Date: 11/27/2022    ECHOCARDIOGRAM REPORT   Patient Name:   Joel Berrocal Like Sr. Date of Exam: 11/27/2022 Medical Rec #:  [id number]        Height:       66.0 in Accession #:    [id number]       Weight:       203.0 lb Date of Birth:  [birth year]         BSA:          2.012 m Patient Age:    76 years         BP:           112/60 mmHg Patient Gender: M                HR:           61 bpm. Exam Location:  [address] Procedure: 2D Echo, Cardiac Doppler and Color Doppler Indications:    I48.91 Atrial Fibrillation  History:        Patient has prior history of Echocardiogram examinations, most                 recent 06/20/2017. Risk Factors:Diabetes and Dyslipidemia.  Sonographer:    Daphine Deutscher RDCS Referring Phys: [id number] JESSE M CLEAVER IMPRESSIONS  1. Left ventricular ejection fraction, by estimation, is 60 to 65%. The left ventricle has normal function. The left ventricle has no regional wall  motion abnormalities. There is mild concentric left ventricular hypertrophy. Left ventricular diastolic function could not be evaluated.  2. Right ventricular systolic function is normal. The right ventricular size is normal.  3. Left atrial size was mildly dilated.  4. Right atrial size was mildly dilated.  5. The mitral valve is normal in structure. Trivial mitral valve regurgitation. No evidence of mitral stenosis.  6. Tricuspid valve regurgitation is moderate to severe.  7. The aortic valve is normal in structure. There is mild calcification of the aortic valve. There is mild thickening of the aortic valve. Aortic valve regurgitation is not visualized. No aortic stenosis is present.  8. The inferior vena cava is normal in size with greater than 50% respiratory variability, suggesting right atrial pressure of 3 mmHg. FINDINGS  Left Ventricle: Left ventricular ejection fraction, by estimation, is 60 to 65%. The left ventricle has normal function. The left ventricle has no regional wall motion abnormalities. The left ventricular internal cavity size was normal in size. There is  mild concentric left ventricular hypertrophy. Left ventricular diastolic function could not be evaluated. Right Ventricle: The right ventricular size is normal. No increase in right ventricular wall thickness. Right ventricular systolic function is normal. Left Atrium: Left atrial size was mildly dilated. Right Atrium: Right atrial size was mildly dilated. Pericardium: There is no evidence of pericardial effusion. Mitral Valve: The mitral valve is normal in structure. Trivial mitral valve regurgitation. No evidence of mitral valve stenosis. Tricuspid Valve: The tricuspid valve is normal in structure. Tricuspid valve regurgitation is moderate to severe. No evidence of tricuspid stenosis. Aortic Valve: The aortic valve is normal in structure. There is mild calcification of the aortic valve. There is mild thickening of the aortic valve.  Aortic  valve regurgitation is not visualized. No aortic stenosis is present. Pulmonic Valve: The pulmonic valve was normal in structure. Pulmonic valve regurgitation is not visualized. No evidence of pulmonic stenosis. Aorta: The aortic root is normal in size and structure. Venous: The inferior vena cava is normal in size with greater than 50% respiratory variability, suggesting right atrial pressure of 3 mmHg. IAS/Shunts: No atrial level shunt detected by color flow Doppler.  LEFT VENTRICLE PLAX 2D LVIDd:         4.70 cm LVIDs:         3.40 cm LV PW:         1.00 cm LV IVS:        1.00 cm LVOT diam:     2.20 cm LV SV:         40 LV SV Index:   20 LVOT Area:     3.80 cm  RIGHT VENTRICLE RV Basal diam:  4.70 cm RV S prime:     10.17 cm/s TAPSE (M-mode): 2.4 cm LEFT ATRIUM             Index        RIGHT ATRIUM           Index LA diam:        5.10 cm 2.53 cm/m   RA Area:     21.80 cm LA Vol (A2C):   68.8 ml 34.19 ml/m  RA Volume:   70.60 ml  35.08 ml/m LA Vol (A4C):   69.9 ml 34.73 ml/m LA Biplane Vol: 73.6 ml 36.57 ml/m  AORTIC VALVE LVOT Vmax:   57.35 cm/s LVOT Vmean:  39.000 cm/s LVOT VTI:    0.105 m  AORTA Ao Root diam: 3.80 cm Ao Asc diam:  3.60 cm MV E velocity: 84.73 cm/s  TRICUSPID VALVE                            TR Peak grad:   25.6 mmHg                            TR Vmax:        253.00 cm/s                             SHUNTS                            Systemic VTI:  0.11 m                            Systemic Diam: 2.20 cm Aditya Sabharwal Electronically signed by Dorthula Nettles Signature Date/Time: 11/27/2022/1:22:28 PM    Final     Recent Labs: Lab Results  Component Value Date   WBC 5.5 11/15/2022   HGB 13.6 11/15/2022   PLT 238 11/15/2022   NA 136 11/15/2022   K 3.5 11/15/2022   CL 104 11/15/2022   CO2 23 11/15/2022   GLUCOSE 136 (H) 11/15/2022   BUN 17 11/15/2022   CREATININE 1.16 11/15/2022   BILITOT 0.4 11/15/2022   ALKPHOS 94 11/15/2022   AST 33 11/15/2022   ALT 35 11/15/2022    PROT 5.9 (L) 11/15/2022   ALBUMIN 3.4 (L) 11/15/2022   CALCIUM 8.4 (L) 11/15/2022   GFRAA >60 01/26/2020  QFTBGOLDPLUS Negative 07/19/2022    Speciality Comments: Orencia 750mg  every 4 weeks TB gold negative on Jan 2019  Procedures:  No procedures performed Allergies: Codeine, Lipitor [atorvastatin], Invokana [canagliflozin], Losartan, Roxicodone [oxycodone], and Vicodin [hydrocodone-acetaminophen]     Assessment / Plan:     Visit Diagnoses: Rheumatoid arthritis involving multiple sites with positive rheumatoid factor (HCC): He has no synovitis on examination today.  He continues to have morning stiffness lasting several hours daily.  Patient remains on IV Orencia 750 mg infusions every 28 days.  He has not had any recent gaps in therapy.  He continues to have morning stiffness lasting several hours.  His arthralgias and joint stiffness improved with a hot shower.  He has had some intermittent nocturnal pain in the left shoulder but has good range of motion on examination today.  He will remain on IV Orencia as prescribed.  He was advised to notify us if he develops signs or symptoms of a flare.  He will follow up in 5 months or sooner if needed.   High risk medication use - IV Orencia 750 mg infusions every 28 days.  CBC and CMP updated on 11/15/22. He will continue to have lab work drawn with infusions.   TB gold negative 07/19/22.  No recent or recurrent infections.  Discussed the importance of holding orencia if he develops signs or symptoms of an infection and to resume once the infection has completely cleared   Primary osteoarthritis of both hands: Patient has PIP and DIP thickening consistent with osteoarthritis of both hands.  Incomplete fist formation noted.  No tenderness or synovitis over MCP joints.  Discussed the importance of joint protection and muscle strengthening.  Status post right partial knee replacement - Dr. Dolores Frame pain.  Painful flexion of the right knee.   Warmth but no effusion.   S/P TKR (total knee replacement), left - x2 by Dr. Priscille Kluver- Doing well.   Primary osteoarthritis of both feet: Good ROM of both ankles with no tenderness or joint swelling.  He is wearing proper fitting shoes.    DDD (degenerative disc disease), cervical: Limited ROM with lateral rotation.   DDD (degenerative disc disease), lumbar - Dr. Shon Baton  Other medical conditions are listed as follows:   Other fatigue: Stable.   Other medical conditions are listed as follows:   Chronic insomnia  History of diabetes mellitus  History of hypertension: BP 103/67 today in the office.   History of hyperlipidemia  History of coronary artery disease  Orders: No orders of the defined types were placed in this encounter.  No orders of the defined types were placed in this encounter.   Follow-Up Instructions: Return in about 5 months (around 05/21/2023) for Rheumatoid arthritis, Osteoarthritis.   Gearldine Bienenstock, PA-C  Note - This record has been created using Dragon software.  Chart creation errors have been sought, but Zietz not always  have been located. Such creation errors do not reflect on  the standard of medical care.

## 2022-12-11 ENCOUNTER — Other Ambulatory Visit: Payer: Self-pay | Admitting: Pharmacist

## 2022-12-11 DIAGNOSIS — M0579 Rheumatoid arthritis with rheumatoid factor of multiple sites without organ or systems involvement: Secondary | ICD-10-CM

## 2022-12-11 DIAGNOSIS — Z79899 Other long term (current) drug therapy: Secondary | ICD-10-CM

## 2022-12-11 NOTE — Progress Notes (Unsigned)
Cardiology Clinic Note   Patient Name: Joel Noblett Mesa Sr. Date of Encounter: 12/12/2022  Primary Care Provider:  [name], MD Primary Cardiologist:  Olga Millers, MD  Patient Profile    Joel Cower Care Sr. 77 year old male presents the clinic today for follow-up evaluation of his atrial fibrillation.  Past Medical History    Past Medical History:  Diagnosis Date   AKI (acute kidney injury) (HCC) 05/18/2021   Anemia    Arthritis    "all over"   Bradycardia    Chronic bronchitis (HCC)    "get it just about q yr" (03/17/2014)   Daily headache    "here lately" (03/17/2014); relates to sinuses   GERD (gastroesophageal reflux disease)    Hard of hearing    hearing aides bilat   Hiatal hernia    History of blood transfusion 2008   "related to OR"   History of kidney stones    Hyperlipidemia    Pneumonia 1972 X 1   Rheumatoid arthritis (HCC)    Type II diabetes mellitus (HCC)    Wears glasses    Past Surgical History:  Procedure Laterality Date   broken finger      surgical repaired left hand 2nd finger    CARDIOVERSION N/A 11/08/2022   Procedure: CARDIOVERSION;  Surgeon: Maisie Fus, MD;  Location: [facility];  Service: Cardiovascular;  Laterality: N/A;   cyst removed      per left knee/posteriorly   CYSTECTOMY Left 2009   "behind knee"   INGUINAL HERNIA REPAIR Right ?2010   JOINT REPLACEMENT     KNEE ARTHROSCOPY Bilateral 1980's   PARTIAL KNEE ARTHROPLASTY Right 08/27/2015   Procedure: RIGHT UNICOMPARTMENTAL KNEE ARTHROPLASTY;  Surgeon: Kathryne Hitch, MD;  Location: [facility];  Service: Orthopedics;  Laterality: Right;   REVISION TOTAL KNEE ARTHROPLASTY Left 2008   SHOULDER SURGERY Left 2019   TOTAL KNEE ARTHROPLASTY Left 2003    Allergies  Allergies  Allergen Reactions   Codeine Swelling   Lipitor [Atorvastatin] Other (See Comments)    Muscle aches    Invokana [Canagliflozin] Other (See Comments)    Stomach upset    Losartan Nausea And  Vomiting   Roxicodone [Oxycodone] Itching   Vicodin [Hydrocodone-Acetaminophen] Itching    History of Present Illness    Joel Skiver Groll Sr.  has a PMH of insulin-dependent type 2 diabetes, osteoarthritis, HLD, primary insomnia, obesity, coronary artery disease, sinus bradycardia, and HTN.  He had nuclear stress test in 2004 which was normal.  His echocardiogram 2/19 showed an EF of 60 to 65%.   He was seen in follow-up by Corine Shelter, PA-C on 11/20/2019.  He denied chest pain and shortness of breath.  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  He was following with Dr. Allayne Stack for his arthritis and doing well.  He denied presyncope, syncope and unusual weakness.   He was lost to follow-up.  He presented to the clinic 09/22/22 for follow-up evaluation and stated he had noticed some increased fatigue which he attributed to age.  He had returned to work part-time.  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  He presented  with his wife.  His EKG showed new atrial fibrillation rate controlled at 77 bpm.  We reviewed stroke risk.  They expressed understanding.  I  started him on apixaban  5 mg twice daily and planned return to the clinic in 3 weeks for follow-up evaluation.  He was cardiac unaware.   He presented to the clinic 10/23/22 for follow-up evaluation and stated he felt somewhat better than he did 1 month ago.  He reported  fatigue.  We reviewed his EKG which continued to show atrial fibrillation.  We reviewed risks and benefits of apixaban and DCCV.  He expressed understanding.  He presented with his wife.  [place]  He agreed to do cardioversion next week.  I order CBC, BMP, echocardiogram and planned follow-up after cardioversion.  He had not missed any doses of his apixaban and denied bleeding issues.   He underwent successful DCCV  11/08/2022 with 200 J x 1.  Post EKG showed sinus bradycardia.  Procedure tolerated well.  He presents to the clinic today for follow-up evaluation and states he has been having some sinus congestion that he feels is related to weather.  He feels that the Eliquis is too expensive.  We discussed options for Coumadin therapy.  He and his wife wish to be transition to Coumadin.  His EKG today shows atrial fibrillation 68 bpm.  We reviewed his echocardiogram and he expressed understanding.  I will refer to A-fib clinic and Coumadin clinic.  Will plan follow-up in 3 to 4 months.  Today denies chest pain, shortness of breath, lower extremity edema, palpitations, melena, hematuria, hemoptysis, diaphoresis, weakness, presyncope, syncope, orthopnea, and PND.    Home Medications    Prior to Admission medications   Medication Sig Start Date End Date Taking? Authorizing Provider  Abatacept (ORENCIA IV) Inject 750 mg into the vein every 28 (twenty-eight) days.    [provider]  albuterol (VENTOLIN HFA) 108 (90 Base) MCG/ACT inhaler Inhale 2 puffs into the lungs every 6 (six) hours as needed for wheezing or shortness of breath. Patient not taking: Reported on 09/22/2022 01/23/22   [name], MD  amLODipine (NORVASC) 5 MG tablet Take 1 tablet (5 mg total) by mouth daily. 10/06/21   [name], MD  apixaban (ELIQUIS) 5 MG TABS tablet Take 1 tablet (5 mg total) by mouth 2 (two) times daily. 10/03/22   Lewayne Bunting, MD  apixaban (ELIQUIS) 5 MG TABS tablet Take 1 tablet (5 mg total) by mouth 2 (two) times daily. 10/16/22   Lewayne Bunting, MD  Ascorbic Acid (VITAMIN C PO) Take by mouth daily. Patient not taking: Reported on 09/22/2022    [provider]  Ascorbic Acid (VITAMIN C) 500 MG CHEW 1 tablet Orally daily    [provider]  aspirin EC 81 MG tablet Take 81 mg by mouth daily.    [provider]  Calcium Carbonate-Vit D-Min (CALCIUM 1200 PO) Take by mouth  daily.    [provider]  cetirizine (ZYRTEC) 10 MG tablet Take 1 tablet (10 mg total) by mouth at bedtime. 02/24/22   [name], MD  Cholecalciferol (VITAMIN D3 PO) Take by mouth daily.    [provider]  Continuous Blood Gluc Sensor (FREESTYLE LIBRE 2 SENSOR) MISC . Marland Kitchen change sensor every 14 days on insulin pump for 90 days 02/04/21   [provider]  empagliflozin (JARDIANCE) 10 MG TABS tablet 1 tablet Orally Once a day Patient not taking: Reported on 09/22/2022 11/11/20   [provider]  fluticasone (FLONASE) 50 MCG/ACT nasal spray Place 2 sprays into both  nostrils daily. Patient not taking: Reported on 09/22/2022 02/24/22   [name], MD  HUMALOG 100 UNIT/ML injection INJ UP TO 65 UNI [place] VIA INSULIN PUMP QD 05/06/18   [provider]  HYDROcodone-acetaminophen (NORCO) 5-325 MG tablet Take 1 tablet by mouth every 6 (six) hours as needed for moderate pain. Patient not taking: Reported on 09/22/2022 08/20/21   Edwin Dada P, DO  hydrOXYzine (VISTARIL) 25 MG capsule Take 1 capsule (25 mg total) by mouth at bedtime as needed (Insomnia). Patient not taking: Reported on 09/22/2022 10/06/21   [name], MD  insulin aspart (NOVOLOG) 100 UNIT/ML FlexPen Inject 1 Units into the skin 3 (three) times daily with meals. 1 unit per hour    [provider]  insulin glargine (LANTUS) 100 UNIT/ML injection 16 units Subcutaneous once a day if unable to use pump for 90 days    [provider]  KRILL OIL 1000 MG CAPS Take 1,000 mg by mouth daily.    [provider]  Lancets (ONETOUCH ULTRASOFT) lancets FOR USE WHEN CHECKING BLOOD SUGAR QID. 05/20/16   [provider]  loratadine (CLARITIN) 10 MG tablet 1 tablet Orally Once a day Patient not taking: Reported on 09/22/2022    [provider]  Misc Natural Products (TART CHERRY ADVANCED PO) Take by mouth daily.    [provider]  Multiple Vitamin  (MULTIVITAMIN WITH MINERALS) TABS Take 1 tablet by mouth daily.    [provider]  Multiple Vitamins-Minerals (ZINC PO) Take by mouth daily. Patient not taking: Reported on 09/22/2022    [provider]  ofloxacin (FLOXIN) 0.3 % OTIC solution Place 5 drops into the left ear daily. 06/27/22   [name], MD  ONE TOUCH ULTRA TEST test strip USE WHEN CHECKING BLOOD SUGAR QID. 05/26/16   [provider]  pantoprazole (PROTONIX) 40 MG tablet TAKE 1 TABLET DAILY. NEED OFFICE VISIT AND LABS BEFORE FURTHER REFILLS 10/08/18   Donita Brooks, MD  potassium chloride (KLOR-CON) 10 MEQ tablet Take by mouth. Patient not taking: Reported on 09/22/2022 05/19/21   [provider]  POTASSIUM PO Take by mouth daily.    [provider]  pravastatin (PRAVACHOL) 40 MG tablet TAKE 1 TABLET EVERY EVENING 08/03/22   [name], MD  Pyridoxine HCl (VITAMIN B-6 PO) Take by mouth daily.    [provider]  ramipril (ALTACE) 5 MG capsule Take 1 capsule (5 mg total) by mouth daily. 10/06/21   [name], MD  tizanidine (ZANAFLEX) 2 MG capsule Take 1 capsule (2 mg total) by mouth 3 (three) times daily. Patient not taking: Reported on 09/22/2022 06/13/21   Kirtland Bouchard, PA-C  vitamin E 400 UNIT capsule Take 400 Units by mouth daily.    [provider]    Family History    Family History  Problem Relation Age of Onset   Cancer Father        prostate   He indicated that his mother is deceased. He indicated that his father is deceased. He indicated that his sister is alive. He indicated that three of his four brothers are alive.  Social History    Social History   Socioeconomic History   Marital status: Married    Spouse name: Genevie Cheshire   Number of children: 3   Years of education: College   Highest education level: Not on file  Occupational History    Employer: UNEMPLOYED  Tobacco Use   Smoking  status: Former    Current packs/day: 0.00     Average packs/day: 1 pack/day for 5.0 years (5.0 ttl pk-yrs)    Types: Cigarettes, Cigars    Start date: 05/16/1963    Quit date: 05/15/1968    Years since quitting: 54.6    Passive exposure: Never   Smokeless tobacco: Never  Vaping Use   Vaping status: Never Used  Substance and Sexual Activity   Alcohol use: No    Alcohol/week: 0.0 standard drinks of alcohol   Drug use: Never   Sexual activity: Yes  Other Topics Concern   Not on file  Social History Narrative   Patient lives at home with family.   Caffeine Use: occasionally   Social Determinants of Health   Financial Resource Strain: Not on file  Food Insecurity: Not on file  Transportation Needs: Not on file  Physical Activity: Not on file  Stress: Not on file  Social Connections: Not on file  Intimate Partner Violence: Not on file     Review of Systems    General:  No chills, fever, night sweats or weight changes.  Cardiovascular:  No chest pain, dyspnea on exertion, edema, orthopnea, palpitations, paroxysmal nocturnal dyspnea. Dermatological: No rash, lesions/masses Respiratory: No cough, dyspnea Urologic: No hematuria, dysuria Abdominal:   No nausea, vomiting, diarrhea, bright red blood per rectum, melena, or hematemesis Neurologic:  No visual changes, wkns, changes in mental status. All other systems reviewed and are otherwise negative except as noted above.  Physical Exam    VS:  BP 124/76 (BP Location: Right Arm, Patient Position: Sitting, Cuff Size: Normal)   Pulse 84   Ht 5\' 6"  (1.676 m)   Wt 208 lb 12.8 oz (94.7 kg)   SpO2 97%   BMI 33.70 kg/m  , BMI Body mass index is 33.7 kg/m. GEN: Well nourished, well developed, in no acute distress. HEENT: normal. Neck: Supple, no JVD, carotid bruits, or masses. Cardiac: Irregularly irregular, no murmurs, rubs, or gallops. No clubbing, cyanosis, edema.  Radials/DP/PT 2+ and equal bilaterally.  Respiratory:  Respirations regular and unlabored, clear to auscultation  bilaterally. GI: Soft, nontender, nondistended, BS + x 4. MS: no deformity or atrophy. Skin: warm and dry, no rash. Neuro:  Strength and sensation are intact. Psych: Normal affect.  Accessory Clinical Findings    Recent Labs: 11/15/2022: ALT 35; BUN 17; Creatinine, Ser 1.16; Hemoglobin 13.6; Platelets 238; Potassium 3.5; Sodium 136   Recent Lipid Panel    Component Value Date/Time   CHOL 120 05/20/2019 1102   TRIG 41 05/20/2019 1102   HDL 41 05/20/2019 1102   CHOLHDL 2.9 05/20/2019 1102   CHOLHDL 3.6 01/23/2019 0811   VLDL 10 01/23/2019 0811   LDLCALC 69 05/20/2019 1102         ECG personally reviewed by me today-EKG Interpretation Date/Time:  Tuesday December 12 2022 10:10:46 EDT Ventricular Rate:  68 PR Interval:    QRS Duration:  94 QT Interval:  404 QTC Calculation: 429 R Axis:   41  Text Interpretation: Atrial fibrillation Nonspecific T wave abnormality When compared with ECG of 08-Nov-2022 09:54, Atrial fibrillation has replaced Sinus rhythm Vent. rate has increased BY  23 BPM Confirmed by Edd Fabian [phone]) on 12/12/2022 10:16:18 AM    EKG 10/23/2022 atrial fibrillation nonspecific T wave abnormality 65 bpm- No acute changes  EKG 09/22/2022 atrial fibrillation with premature aberrantly conducted complexes incomplete right bundle branch block 77 bpm  Echocardiogram 06/20/2017    -------------------------------------------------------------------  LV EF:  60% -   65%   -------------------------------------------------------------------  Indications:     CAD (I25.10).   -------------------------------------------------------------------  History:  PMH:  Hyperlipidemia.  Risk factors:  Hypertension.  Diabetes mellitus.   -------------------------------------------------------------------  Study Conclusions   - Left ventricle: The cavity size was normal. Wall thickness was    increased in a pattern of mild LVH. Systolic function was normal.    The estimated  ejection fraction was in the range of 60% to 65%.    Wall motion was normal; there were no regional wall motion    abnormalities. Doppler parameters are consistent with abnormal    left ventricular relaxation (grade 1 diastolic dysfunction).  - Aortic valve: There was no stenosis.  - Mitral valve: There was no significant regurgitation.  - Left atrium: The atrium was mildly dilated.  - Right ventricle: The cavity size was normal. Systolic function    was normal.  - Tricuspid valve: Peak RV-RA gradient (S): 31 mm Hg.  - Pulmonary arteries: PA peak pressure: 34 mm Hg (S).  - Inferior vena cava: The vessel was normal in size. The    respirophasic diameter changes were in the normal range (= 50%),    consistent with normal central venous pressure.   Impressions:   - Normal LV size with mild LV hypertrophy. EF 60-65%. Normal RV    size and systolic function. No significant valvular    abnormalities.   -------------------------------------------------------------------  Study data:  No prior study was available for comparison.  Study  status:  Routine.  Procedure:  The patient reported no pain pre or  post test. Transthoracic echocardiography. Image quality was  adequate.          Transthoracic echocardiography.  M-mode,  complete 2D, spectral Doppler, and color Doppler.  Birthdate:  Patient birthdate: 1946-03-31.  Age:  Patient is 77 yr old.  Sex:  Gender: male.    BMI: 32.6 kg/m^2.  Blood pressure:     116/68  Patient status:  Outpatient.  Study date:  Study date: 06/20/2017.  Study time: 10:42 AM.  Location:  [facility]   -------------------------------------------------------------------   -------------------------------------------------------------------  Left ventricle:  The cavity size was normal. Wall thickness was  increased in a pattern of mild LVH. Systolic function was normal.  The estimated ejection fraction was in the range of 60% to 65%.  Wall motion was normal;  there were no regional wall motion  abnormalities. Doppler parameters are consistent with abnormal left  ventricular relaxation (grade 1 diastolic dysfunction).   -------------------------------------------------------------------  Aortic valve:   Trileaflet.  Doppler:   There was no stenosis.  There was no regurgitation.   -------------------------------------------------------------------  Aorta: Aortic root: The aortic root was normal in size.  Ascending aorta: The ascending aorta was normal in size.   -------------------------------------------------------------------  Mitral valve:   Normal thickness leaflets .  Doppler:   There was  no evidence for stenosis.   There was no significant regurgitation.    -------------------------------------------------------------------  Left atrium:  The atrium was mildly dilated.   -------------------------------------------------------------------  Right ventricle:  The cavity size was normal. Systolic function was  normal.   -------------------------------------------------------------------  Pulmonic valve:    Structurally normal valve.   Cusp separation was  normal.  Doppler:  Transvalvular velocity was within the normal  range. There was trivial regurgitation.   -------------------------------------------------------------------  Tricuspid valve:   Doppler:  There was trivial regurgitation.    -------------------------------------------------------------------  Right atrium:  The atrium was normal in  size.   -------------------------------------------------------------------  Pericardium: There was no pericardial effusion.   -------------------------------------------------------------------  Systemic veins:  Inferior vena cava: The vessel was normal in size. The  respirophasic diameter changes were in the normal range (= 50%),  consistent with normal central venous pressure. Diameter: 18 mm.    nuclear stress test 08/20/2002 Low  risk.  There was some concern for balanced ischemia.  Echocardiogram 11/27/2022  IMPRESSIONS     1. Left ventricular ejection fraction, by estimation, is 60 to 65%. The  left ventricle has normal function. The left ventricle has no regional  wall motion abnormalities. There is mild concentric left ventricular  hypertrophy. Left ventricular diastolic  function could not be evaluated.   2. Right ventricular systolic function is normal. The right ventricular  size is normal.   3. Left atrial size was mildly dilated.   4. Right atrial size was mildly dilated.   5. The mitral valve is normal in structure. Trivial mitral valve  regurgitation. No evidence of mitral stenosis.   6. Tricuspid valve regurgitation is moderate to severe.   7. The aortic valve is normal in structure. There is mild calcification  of the aortic valve. There is mild thickening of the aortic valve. Aortic  valve regurgitation is not visualized. No aortic stenosis is present.   8. The inferior vena cava is normal in size with greater than 50%  respiratory variability, suggesting right atrial pressure of 3 mmHg.   FINDINGS   Left Ventricle: Left ventricular ejection fraction, by estimation, is 60  to 65%. The left ventricle has normal function. The left ventricle has no  regional wall motion abnormalities. The left ventricular internal cavity  size was normal in size. There is   mild concentric left ventricular hypertrophy. Left ventricular diastolic  function could not be evaluated.   Right Ventricle: The right ventricular size is normal. No increase in  right ventricular wall thickness. Right ventricular systolic function is  normal.   Left Atrium: Left atrial size was mildly dilated.   Right Atrium: Right atrial size was mildly dilated.   Pericardium: There is no evidence of pericardial effusion.   Mitral Valve: The mitral valve is normal in structure. Trivial mitral  valve regurgitation. No evidence of  mitral valve stenosis.   Tricuspid Valve: The tricuspid valve is normal in structure. Tricuspid  valve regurgitation is moderate to severe. No evidence of tricuspid  stenosis.   Aortic Valve: The aortic valve is normal in structure. There is mild  calcification of the aortic valve. There is mild thickening of the aortic  valve. Aortic valve regurgitation is not visualized. No aortic stenosis is  present.   Pulmonic Valve: The pulmonic valve was normal in structure. Pulmonic valve  regurgitation is not visualized. No evidence of pulmonic stenosis.   Aorta: The aortic root is normal in size and structure.   Venous: The inferior vena cava is normal in size with greater than 50%  respiratory variability, suggesting right atrial pressure of 3 mmHg.   IAS/Shunts: No atrial level shunt detected by color flow Doppler.    Assessment & Plan   1.  Atrial fibrilation-EKG today shows atrial fibrillation 68 bpm.  Reports compliance with apixaban.  Previously asked about transitioning from apixaban to Coumadin.  Patient was instructed that he would have to have therapeutic INR before discontinuing apixaban.  Today he denies bleeding issues.    This patients CHA2DS2-VASc Score and unadjusted Ischemic Stroke Rate (% per year) is equal to 4.8 %  stroke rate/year from a score of 4 Above score calculated as 1 point each if present [HTN,  Vascular=MI/PAD/Aortic Plaque, Age x 2 Continue apixaban 5 mg twice daily Continue to avoid triggers caffeine, chocolate, EtOH, dehydration etc. Maintain physical activity Refer to A-fib clinic Coumadin clinic for transition to Coumadin and off of apixaban  Essential hypertension-BP today 124/76 Maintain blood pressure log Continue current medical therapy Low salt diet- Salty 6 given    Hyperlipidemia-LDL 71 on 04/18/22. Continue statin therapy Continue high-fiber diet Plan for repeat fasting lipids and LFTs- 12/24   Disposition: Follow-up with Dr. Jens [name] or me  in 4 months.  Thomasene Ripple. Pamela Intrieri NP-C     12/12/2022, 10:12 AM [facility] [address] Office [phone] Fax [phone]    I spent 14 minutes examining this patient, reviewing medications, and using patient centered shared decision making involving her cardiac care.  Prior to her visit I spent greater than 20 minutes reviewing her past medical history,  medications, and prior cardiac tests.

## 2022-12-11 NOTE — Progress Notes (Signed)
Next infusion scheduled for Orencia IV on 12/13/2022 and due for updated orders. Diagnosis:  RA  Dose: 750mg  IV every 4 weeks (appropriate based on last recorded weight of 92.1kg)  Last Clinic Visit: 07/19/2022 Next Clinic Visit: 12/19/2022  Last infusion: 11/15/2022  Labs: CBC and CMP 11/15/2022 - stable TB Gold: negative on 07/19/2022   Orders placed for Orencia IV x 3 doses along with premedication of acetaminophen and diphenhydramine to be administered 30 minutes before medication infusion.  Standing CBC with diff/platelet and CMP with GFR orders placed to be drawn every 2 months.  Next TB gold due 07/19/2023  Chesley Mires, PharmD, MPH, BCPS, CPP Clinical Pharmacist (Rheumatology and Pulmonology)

## 2022-12-12 ENCOUNTER — Ambulatory Visit: Payer: Medicare Other | Attending: General Practice | Admitting: General Practice

## 2022-12-12 ENCOUNTER — Encounter: Payer: Self-pay | Admitting: *Deleted

## 2022-12-12 ENCOUNTER — Encounter: Payer: Self-pay | Admitting: General Practice

## 2022-12-12 ENCOUNTER — Telehealth: Payer: Self-pay | Admitting: *Deleted

## 2022-12-12 VITALS — BP 124/76 | HR 84 | Ht 66.0 in | Wt 208.8 lb

## 2022-12-12 DIAGNOSIS — I10 Essential (primary) hypertension: Secondary | ICD-10-CM | POA: Insufficient documentation

## 2022-12-12 DIAGNOSIS — I4891 Unspecified atrial fibrillation: Secondary | ICD-10-CM

## 2022-12-12 DIAGNOSIS — E782 Mixed hyperlipidemia: Secondary | ICD-10-CM | POA: Diagnosis not present

## 2022-12-12 DIAGNOSIS — I4819 Other persistent atrial fibrillation: Secondary | ICD-10-CM | POA: Insufficient documentation

## 2022-12-12 DIAGNOSIS — Z5181 Encounter for therapeutic drug level monitoring: Secondary | ICD-10-CM | POA: Insufficient documentation

## 2022-12-12 MED ORDER — WARFARIN SODIUM 5 MG PO TABS
ORAL_TABLET | ORAL | 0 refills | Status: DC
Start: 2022-12-12 — End: 2023-01-16

## 2022-12-12 NOTE — Telephone Encounter (Addendum)
Spoke with pt regarding switching from eliquis to warfarin. Pt states he has 8 days of eliquis left. Advised on his last 6 pills he will overlap eliquis and warfarin; he will take eliquis twice a day as normal and take warfarin 5mg  at 5pm, then once the eliquis is gone he will continue warfarin 5mg  daily at 5pm. Also, advised warfarin requires weekly checks until stable a couple times then we will add a week. He verbalized understanding and set an appt for 1 week after starting the warfarin. He asked for instructions to be sent via MyChart and confirmed will send the information listed below.   12/18/22-Eliquis twice a day at normal times 9am & 9pm (if you take at a different time, please continue your normal time).  Warfarin 5mg  at 5pm   12/19/22-Eliquis twice a day at normal times 9am & 9pm  Warfarin 5mg  at 5pm   12/20/22-Eliquis twice a day at normal times 9am & 9pm  Warfarin 5mg  at 5pm   12/21/22-Eliquis should be out   Continue Warfarin 5mg  at 5pm   12/22/22-Continue Warfarin 5mg  at 5pm   12/23/22-Continue Warfarin 5mg  at 5pm   12/24/22-Continue Warfarin 5mg  at 5pm   12/25/22-Report to [address] [place] for your Anticoagulation Clinic at 1030am. This appointment is educational & we will check the INR level by finger stick to adjust warfarin dose. This appointment will take about 30 minutes. Anticoagulation Clinic [phone]; we will also give your more instructions at this appointment on your warfarin dose and you will receive a printout.   Also, sent warfain 5mg  tablet to [facility] per request

## 2022-12-12 NOTE — Patient Instructions (Signed)
Medication Instructions:  CONTINUE TO TAKE YOUR APIXABAN UNTIL TOLD TO STOP BY COUMADIN CLINIC *If you need a refill on your cardiac medications before your next appointment, please call your pharmacy*  Lab Work: NONE If you have labs (blood work) drawn today and your tests are completely normal, you will receive your results only by:  MyChart Message (if you have MyChart) OR  A paper copy in the mail If you have any lab test that is abnormal or we need to change your treatment, we will call you to review the results.  Testing/Procedures: NONE  Follow-Up: At [facility], you and your health needs are our priority.  As part of our continuing mission to provide you with exceptional heart care, we have created designated Provider Care Teams.  These Care Teams include your primary Cardiologist (physician) and Advanced Practice Providers (APPs -  Physician Assistants and Nurse Practitioners) who all work together to provide you with the care you need, when you need it.  Your next appointment:   3-4 month(s)  Provider:   Olga Millers, MD  or Edd Fabian, FNP        Other Instructions REFER TO AFIB CLINIC-1ST AVAILABLE REFER TO COUMADIN-ASAP   Please try to avoid these triggers: Do not use any products that have nicotine or tobacco in them. These include cigarettes, e-cigarettes, and chewing tobacco. If you need help quitting, ask your doctor. Eat heart-healthy foods. Talk with your doctor about the right eating plan for you. Exercise regularly as told by your doctor. Stay hydrated Do not drink alcohol, Caffeine or chocolate. Lose weight if you are overweight. Do not use drugs, including cannabis

## 2022-12-12 NOTE — Addendum Note (Signed)
Addended by: Pamala Hurry B on: 12/12/2022 03:04 PM   Modules accepted: Orders

## 2022-12-13 ENCOUNTER — Encounter (HOSPITAL_COMMUNITY)
Admission: RE | Admit: 2022-12-13 | Discharge: 2022-12-13 | Disposition: A | Discharge status: Home / Self Care | Payer: Medicare Other | Source: Ambulatory Visit | Attending: Rheumatology | Admitting: Rheumatology

## 2022-12-13 DIAGNOSIS — M0579 Rheumatoid arthritis with rheumatoid factor of multiple sites without organ or systems involvement: Secondary | ICD-10-CM | POA: Insufficient documentation

## 2022-12-13 DIAGNOSIS — Z79899 Other long term (current) drug therapy: Secondary | ICD-10-CM | POA: Diagnosis not present

## 2022-12-13 MED ORDER — ACETAMINOPHEN 325 MG PO TABS: 650.0000 mg | ORAL_TABLET | ORAL | Status: DC

## 2022-12-13 MED ORDER — DIPHENHYDRAMINE HCL 25 MG PO CAPS: 25.0000 mg | ORAL_CAPSULE | ORAL | Status: DC

## 2022-12-13 MED ORDER — SODIUM CHLORIDE 0.9 % IV SOLN
750.0000 mg | INTRAVENOUS | Status: DC
  Administered 2022-12-13: 750 mg via INTRAVENOUS
  Filled 2022-12-13: qty 30

## 2022-12-19 ENCOUNTER — Ambulatory Visit (HOSPITAL_BASED_OUTPATIENT_CLINIC_OR_DEPARTMENT_OTHER)
Admission: RE | Admit: 2022-12-19 | Discharge: 2022-12-19 | Disposition: A | Discharge status: Home / Self Care | Payer: Medicare Other | Source: Ambulatory Visit | Attending: Physician Assistant | Admitting: Physician Assistant

## 2022-12-19 ENCOUNTER — Encounter (HOSPITAL_COMMUNITY): Payer: Self-pay | Admitting: Physician Assistant

## 2022-12-19 ENCOUNTER — Encounter: Payer: Self-pay | Admitting: Physician Assistant

## 2022-12-19 ENCOUNTER — Telehealth: Payer: Self-pay | Admitting: Pharmacist

## 2022-12-19 ENCOUNTER — Encounter: Payer: Medicare Other | Attending: Rheumatology | Admitting: Physician Assistant

## 2022-12-19 VITALS — BP 128/90 | HR 73 | Ht 66.0 in | Wt 210.0 lb

## 2022-12-19 VITALS — BP 103/67 | HR 65 | Resp 16 | Ht 66.0 in | Wt 209.0 lb

## 2022-12-19 DIAGNOSIS — Z7901 Long term (current) use of anticoagulants: Secondary | ICD-10-CM | POA: Insufficient documentation

## 2022-12-19 DIAGNOSIS — R5383 Other fatigue: Secondary | ICD-10-CM | POA: Diagnosis not present

## 2022-12-19 DIAGNOSIS — D6869 Other thrombophilia: Secondary | ICD-10-CM | POA: Insufficient documentation

## 2022-12-19 DIAGNOSIS — M5030 Other cervical disc degeneration, unspecified cervical region: Secondary | ICD-10-CM | POA: Diagnosis not present

## 2022-12-19 DIAGNOSIS — Z8679 Personal history of other diseases of the circulatory system: Secondary | ICD-10-CM | POA: Diagnosis not present

## 2022-12-19 DIAGNOSIS — M19041 Primary osteoarthritis, right hand: Secondary | ICD-10-CM | POA: Diagnosis not present

## 2022-12-19 DIAGNOSIS — I4819 Other persistent atrial fibrillation: Secondary | ICD-10-CM | POA: Insufficient documentation

## 2022-12-19 DIAGNOSIS — Z8639 Personal history of other endocrine, nutritional and metabolic disease: Secondary | ICD-10-CM

## 2022-12-19 DIAGNOSIS — M0579 Rheumatoid arthritis with rheumatoid factor of multiple sites without organ or systems involvement: Secondary | ICD-10-CM | POA: Diagnosis not present

## 2022-12-19 DIAGNOSIS — F5104 Psychophysiologic insomnia: Secondary | ICD-10-CM

## 2022-12-19 DIAGNOSIS — M19042 Primary osteoarthritis, left hand: Secondary | ICD-10-CM

## 2022-12-19 DIAGNOSIS — M19071 Primary osteoarthritis, right ankle and foot: Secondary | ICD-10-CM | POA: Diagnosis not present

## 2022-12-19 DIAGNOSIS — R001 Bradycardia, unspecified: Secondary | ICD-10-CM | POA: Insufficient documentation

## 2022-12-19 DIAGNOSIS — M19072 Primary osteoarthritis, left ankle and foot: Secondary | ICD-10-CM | POA: Diagnosis not present

## 2022-12-19 DIAGNOSIS — M5136 Other intervertebral disc degeneration, lumbar region: Secondary | ICD-10-CM | POA: Diagnosis not present

## 2022-12-19 DIAGNOSIS — Z79899 Other long term (current) drug therapy: Secondary | ICD-10-CM | POA: Diagnosis not present

## 2022-12-19 DIAGNOSIS — Z96651 Presence of right artificial knee joint: Secondary | ICD-10-CM | POA: Diagnosis not present

## 2022-12-19 DIAGNOSIS — I10 Essential (primary) hypertension: Secondary | ICD-10-CM | POA: Insufficient documentation

## 2022-12-19 DIAGNOSIS — I2510 Atherosclerotic heart disease of native coronary artery without angina pectoris: Secondary | ICD-10-CM | POA: Insufficient documentation

## 2022-12-19 DIAGNOSIS — Z96652 Presence of left artificial knee joint: Secondary | ICD-10-CM

## 2022-12-19 DIAGNOSIS — I700 Atherosclerosis of aorta: Secondary | ICD-10-CM | POA: Insufficient documentation

## 2022-12-19 NOTE — Telephone Encounter (Signed)
Patient's wife called and asked for warfarin instructions again over the phone. Read off instructions and reminded of INR check on 8/12

## 2022-12-19 NOTE — Progress Notes (Signed)
Primary Care Physician: [name], MD Primary Cardiologist: Olga Millers, MD Electrophysiologist: None  Referring Physician: Edd Fabian NP   Joel Cower Lavery Sr. is a 76 y.o. male with a history of DM, HLD, RA, CAD, HTN, atrial fibrillation who presents for consultation in the [facility]. The patient was initially diagnosed with atrial fibrillation 09/22/22 at follow-up with Edd Fabian. Patient had noticed some increased fatigue which he attributed to age. His EKG showed new atrial fibrillation rate controlled at 77 bpm. He was started on Eliquis and underwent DCCV on 11/08/22. Unfortunately, he was back in afib at his follow up on 12/12/22. Patient has a CHADS2VASC score of 5. He was transitioned from Eliquis to warfarin due to cost.   Today, patient remains in rate controlled afib with symptoms of fatigue and SOB with exertion. He did feel better for about one week post DCCV. No bleeding issues on anticoagulation.   Today, he denies symptoms of palpitations, chest pain, shortness of breath, orthopnea, PND, lower extremity edema, dizziness, presyncope, syncope, snoring, daytime somnolence, bleeding, or neurologic sequela. The patient is tolerating medications without difficulties and is otherwise without complaint today.    Atrial Fibrillation Risk Factors:  he does not have symptoms or diagnosis of sleep apnea. he does not have a history of rheumatic fever.   Atrial Fibrillation Management history:  Previous antiarrhythmic drugs: none Previous cardioversions: 11/08/22 Previous ablations: none Anticoagulation history: Eliquis, warfarin   ROS- All systems are reviewed and negative except as per the HPI above.   Physical Exam: BP (!) 128/90   Pulse 73   Ht 5\' 6"  (1.676 m)   Wt 95.3 kg   BMI 33.89 kg/m   GEN: Well nourished, well developed in no acute distress NECK: No JVD; No carotid bruits CARDIAC: Irregularly irregular rate and rhythm,  no murmurs, rubs, gallops RESPIRATORY:  Clear to auscultation without rales, wheezing or rhonchi  ABDOMEN: Soft, non-tender, non-distended EXTREMITIES:  No edema; No deformity   Wt Readings from Last 3 Encounters:  12/19/22 95.3 kg  12/19/22 94.8 kg  12/13/22 94.8 kg     EKG today demonstrates  Afib Vent. rate 73 BPM PR interval * ms QRS duration 90 ms QT/QTcB 378/416 ms  Echo 11/27/22 demonstrated   1. Left ventricular ejection fraction, by estimation, is 60 to 65%. The  left ventricle has normal function. The left ventricle has no regional  wall motion abnormalities. There is mild concentric left ventricular  hypertrophy. Left ventricular diastolic function could not be evaluated.   2. Right ventricular systolic function is normal. The right ventricular  size is normal.   3. Left atrial size was mildly dilated.   4. Right atrial size was mildly dilated.   5. The mitral valve is normal in structure. Trivial mitral valve  regurgitation. No evidence of mitral stenosis.   6. Tricuspid valve regurgitation is moderate to severe.   7. The aortic valve is normal in structure. There is mild calcification  of the aortic valve. There is mild thickening of the aortic valve. Aortic  valve regurgitation is not visualized. No aortic stenosis is present.   8. The inferior vena cava is normal in size with greater than 50%  respiratory variability, suggesting right atrial pressure of 3 mmHg.    CHA2DS2-VASc Score = 5  The patient's score is based upon: CHF History: 0 HTN History: 1 Diabetes History: 1 Stroke History: 0 Vascular Disease History: 1 (CAD/aortic atherosclerosis) Age Score: 2  Gender Score: 0       ASSESSMENT AND PLAN: Persistent Atrial Fibrillation (ICD10:  I48.19) The patient's CHA2DS2-VASc score is 5, indicating a 7.2% annual risk of stroke.   S/p DCCV 11/08/22 with early return of afib We discussed rate vs rhythm control today. Would favor rhythm control as he did  feel improved after DCCV. Would avoid class IC with coronary calcifications. Bradycardia limits medication options (HR 40's in SR). We discussed dofetilide and ablation. He is agreeable to discussing ablation with EP, will refer.  Continue warfarin (currently transitioning from Eliquis)  Secondary Hypercoagulable State (ICD10:  D68.69) The patient is at significant risk for stroke/thromboembolism based upon his CHA2DS2-VASc Score of 5.  Continue Warfarin (Coumadin).   CAD/aortic atherosclerosis No anginal symptoms On statin  HTN Stable on current regimen   Follow up with EP to discuss ablation. Dr Jens [name] as scheduled.    Jorja Loa PA-C Afib Clinic [facility] [address] [place], [place] [address] [phone]

## 2022-12-19 NOTE — Patient Instructions (Signed)
If you have signs or symptoms of an infection or start antibiotics: First, call your PCP for workup of your infection. Hold your medication through the infection, until you complete your antibiotics, and until symptoms resolve if you take the following: Injectable medication (Actemra, Benlysta, Cimzia, Cosentyx, Enbrel, Humira, Kevzara, Orencia, Remicade, Simponi, Stelara, Taltz, Tremfya) Methotrexate Leflunomide (Arava) Mycophenolate (Cellcept) Morrie Sheldon, Olumiant, or Rinvoq

## 2022-12-24 ENCOUNTER — Other Ambulatory Visit: Payer: Self-pay

## 2022-12-24 DIAGNOSIS — Z5181 Encounter for therapeutic drug level monitoring: Secondary | ICD-10-CM

## 2022-12-24 DIAGNOSIS — I4891 Unspecified atrial fibrillation: Secondary | ICD-10-CM

## 2022-12-25 ENCOUNTER — Ambulatory Visit: Payer: Medicare Other | Attending: Cardiology

## 2022-12-25 DIAGNOSIS — Z7901 Long term (current) use of anticoagulants: Secondary | ICD-10-CM | POA: Diagnosis not present

## 2022-12-25 DIAGNOSIS — Z5181 Encounter for therapeutic drug level monitoring: Secondary | ICD-10-CM | POA: Diagnosis not present

## 2022-12-25 DIAGNOSIS — I4891 Unspecified atrial fibrillation: Secondary | ICD-10-CM

## 2022-12-25 NOTE — Patient Instructions (Addendum)
Description   Continue taking 1  tablet daily.  Stay consistent with greens each week (1 per week)  Coumadin Clinic [phone]     A full discussion of the nature of anticoagulants has been carried out.  A benefit risk analysis has been presented to the patient, so that they understand the justification for choosing anticoagulation at this time. The need for frequent and regular monitoring, precise dosage adjustment and compliance is stressed.  Side effects of potential bleeding are discussed.  The patient should avoid any OTC items containing aspirin or ibuprofen, and should avoid great swings in general diet.  Avoid alcohol consumption.

## 2022-12-29 ENCOUNTER — Ambulatory Visit: Payer: Medicare Other

## 2023-01-01 ENCOUNTER — Ambulatory Visit: Payer: Medicare Other | Attending: Cardiology

## 2023-01-01 DIAGNOSIS — Z7901 Long term (current) use of anticoagulants: Secondary | ICD-10-CM

## 2023-01-01 DIAGNOSIS — Z5181 Encounter for therapeutic drug level monitoring: Secondary | ICD-10-CM

## 2023-01-01 DIAGNOSIS — I4891 Unspecified atrial fibrillation: Secondary | ICD-10-CM

## 2023-01-01 LAB — POCT INR: INR: 3.3 — AB (ref 2.0–3.0)

## 2023-01-01 NOTE — Patient Instructions (Signed)
DECREASE TO 1 TABLET DAILY, EXCEPT 0.5 TABLET ON WEDNESDAYS. Stay consistent with greens each week (1 per week)  Coumadin Clinic [phone]

## 2023-01-08 ENCOUNTER — Ambulatory Visit: Payer: Medicare Other | Attending: Cardiology

## 2023-01-08 DIAGNOSIS — Z5181 Encounter for therapeutic drug level monitoring: Secondary | ICD-10-CM

## 2023-01-08 DIAGNOSIS — I4891 Unspecified atrial fibrillation: Secondary | ICD-10-CM | POA: Diagnosis not present

## 2023-01-08 LAB — POCT INR: INR: 5.5 — AB (ref 2.0–3.0)

## 2023-01-08 NOTE — Patient Instructions (Signed)
Description   HOLD today's dose and tomorrow's dose and then START 1 TABLET DAILY, EXCEPT 0.5 TABLET ON Sundays, Mondays, Wednesdays, and Fridays. Stay consistent with greens each week (1 per week)  Coumadin Clinic [phone]

## 2023-01-08 NOTE — Addendum Note (Signed)
Addended by: Betsy Coder B on: 01/08/2023 01:20 PM   Modules accepted: Orders

## 2023-01-10 ENCOUNTER — Ambulatory Visit (HOSPITAL_COMMUNITY)
Admission: RE | Admit: 2023-01-10 | Discharge: 2023-01-10 | Disposition: A | Discharge status: Home / Self Care | Payer: Medicare Other | Source: Ambulatory Visit | Attending: Rheumatology | Admitting: Rheumatology

## 2023-01-10 DIAGNOSIS — Z79899 Other long term (current) drug therapy: Secondary | ICD-10-CM

## 2023-01-10 DIAGNOSIS — M0579 Rheumatoid arthritis with rheumatoid factor of multiple sites without organ or systems involvement: Secondary | ICD-10-CM | POA: Diagnosis not present

## 2023-01-10 MED ORDER — SODIUM CHLORIDE 0.9 % IV SOLN
750.0000 mg | INTRAVENOUS | Status: DC
  Administered 2023-01-10: 750 mg via INTRAVENOUS
  Filled 2023-01-10: qty 30

## 2023-01-10 MED ORDER — ACETAMINOPHEN 325 MG PO TABS: 650.0000 mg | ORAL_TABLET | ORAL | Status: DC

## 2023-01-10 MED ORDER — DIPHENHYDRAMINE HCL 25 MG PO CAPS: 25.0000 mg | ORAL_CAPSULE | ORAL | Status: DC

## 2023-01-14 ENCOUNTER — Other Ambulatory Visit: Payer: Self-pay | Admitting: General Practice

## 2023-01-14 DIAGNOSIS — Z5181 Encounter for therapeutic drug level monitoring: Secondary | ICD-10-CM

## 2023-01-14 DIAGNOSIS — I4891 Unspecified atrial fibrillation: Secondary | ICD-10-CM

## 2023-01-17 ENCOUNTER — Ambulatory Visit: Payer: Medicare Other | Attending: General Practice | Admitting: *Deleted

## 2023-01-17 DIAGNOSIS — I4891 Unspecified atrial fibrillation: Secondary | ICD-10-CM | POA: Insufficient documentation

## 2023-01-17 DIAGNOSIS — Z7901 Long term (current) use of anticoagulants: Secondary | ICD-10-CM | POA: Diagnosis not present

## 2023-01-17 DIAGNOSIS — Z5181 Encounter for therapeutic drug level monitoring: Secondary | ICD-10-CM | POA: Diagnosis not present

## 2023-01-17 LAB — POCT INR: INR: 2 (ref 2.0–3.0)

## 2023-01-17 NOTE — Patient Instructions (Addendum)
Description   Continue taking warfarin 1/2 TABLET DAILY, EXCEPT 1 TABLET ON TUESDAY, THURSDAY, AND SATURDAY. Stay consistent with greens each week (1 per week)  Coumadin Clinic [phone]

## 2023-01-22 DIAGNOSIS — Z794 Long term (current) use of insulin: Secondary | ICD-10-CM | POA: Diagnosis not present

## 2023-01-22 DIAGNOSIS — I10 Essential (primary) hypertension: Secondary | ICD-10-CM | POA: Diagnosis not present

## 2023-01-22 DIAGNOSIS — E1122 Type 2 diabetes mellitus with diabetic chronic kidney disease: Secondary | ICD-10-CM | POA: Diagnosis not present

## 2023-01-22 DIAGNOSIS — N182 Chronic kidney disease, stage 2 (mild): Secondary | ICD-10-CM | POA: Diagnosis not present

## 2023-01-24 ENCOUNTER — Ambulatory Visit: Payer: Medicare Other

## 2023-01-26 ENCOUNTER — Ambulatory Visit: Payer: Medicare Other | Attending: Cardiology

## 2023-01-26 DIAGNOSIS — Z5181 Encounter for therapeutic drug level monitoring: Secondary | ICD-10-CM | POA: Diagnosis not present

## 2023-01-26 DIAGNOSIS — I4891 Unspecified atrial fibrillation: Secondary | ICD-10-CM | POA: Insufficient documentation

## 2023-01-26 DIAGNOSIS — Z7901 Long term (current) use of anticoagulants: Secondary | ICD-10-CM | POA: Diagnosis not present

## 2023-01-26 LAB — POCT INR: INR: 1.5 — AB (ref 2.0–3.0)

## 2023-01-26 NOTE — Patient Instructions (Signed)
TAKE  1 TABLET TODAY ONLY AND 2 TABLETS SATURDAY THEN Continue taking warfarin 1/2 TABLET DAILY, EXCEPT 1 TABLET ON TUESDAY, THURSDAY, AND SATURDAY. Stay consistent with greens each week (1 per week)  Coumadin Clinic [phone]

## 2023-02-02 ENCOUNTER — Ambulatory Visit: Payer: Medicare Other | Attending: Cardiovascular Disease | Admitting: *Deleted

## 2023-02-02 DIAGNOSIS — Z5181 Encounter for therapeutic drug level monitoring: Secondary | ICD-10-CM | POA: Insufficient documentation

## 2023-02-02 DIAGNOSIS — I4891 Unspecified atrial fibrillation: Secondary | ICD-10-CM | POA: Insufficient documentation

## 2023-02-02 LAB — POCT INR: POC INR: 2

## 2023-02-02 NOTE — Patient Instructions (Signed)
Description   START taking warfarin 1 tablet daily except for 1/2 a tablet on Monday, Wednesday and Friday. Stay consistent with greens each week (1 per week)  Coumadin Clinic [phone]

## 2023-02-05 ENCOUNTER — Ambulatory Visit (INDEPENDENT_AMBULATORY_CARE_PROVIDER_SITE_OTHER): Payer: Medicare Other

## 2023-02-05 DIAGNOSIS — Z794 Long term (current) use of insulin: Secondary | ICD-10-CM | POA: Diagnosis not present

## 2023-02-05 DIAGNOSIS — E1169 Type 2 diabetes mellitus with other specified complication: Secondary | ICD-10-CM | POA: Diagnosis not present

## 2023-02-05 DIAGNOSIS — E119 Type 2 diabetes mellitus without complications: Secondary | ICD-10-CM

## 2023-02-05 DIAGNOSIS — E118 Type 2 diabetes mellitus with unspecified complications: Secondary | ICD-10-CM

## 2023-02-05 DIAGNOSIS — E785 Hyperlipidemia, unspecified: Secondary | ICD-10-CM | POA: Insufficient documentation

## 2023-02-05 LAB — HM DIABETES EYE EXAM

## 2023-02-05 NOTE — Progress Notes (Signed)
Joel Cower Azzarello Sr. arrived 02/05/2023 and has given verbal consent to obtain images and complete their overdue diabetic retinal screening.  The images have been sent to an ophthalmologist or optometrist for review and interpretation.  Results will be sent back to [name], MD for review.  Patient has been informed they will be contacted when we receive the results via telephone or MyChart

## 2023-02-07 ENCOUNTER — Ambulatory Visit (HOSPITAL_COMMUNITY)
Admission: RE | Admit: 2023-02-07 | Discharge: 2023-02-07 | Disposition: A | Discharge status: Home / Self Care | Payer: Medicare Other | Source: Ambulatory Visit | Attending: Rheumatology | Admitting: Rheumatology

## 2023-02-07 ENCOUNTER — Other Ambulatory Visit: Payer: Self-pay | Admitting: Pharmacist

## 2023-02-07 DIAGNOSIS — Z79899 Other long term (current) drug therapy: Secondary | ICD-10-CM

## 2023-02-07 DIAGNOSIS — M0579 Rheumatoid arthritis with rheumatoid factor of multiple sites without organ or systems involvement: Secondary | ICD-10-CM

## 2023-02-07 LAB — CBC WITH DIFFERENTIAL/PLATELET
Abs Immature Granulocytes: 0.03 10*3/uL (ref 0.00–0.07)
Basophils Absolute: 0.1 10*3/uL (ref 0.0–0.1)
Basophils Relative: 1 %
Eosinophils Absolute: 0.2 10*3/uL (ref 0.0–0.5)
Eosinophils Relative: 2 %
HCT: 42.2 % (ref 39.0–52.0)
Hemoglobin: 14.2 g/dL (ref 13.0–17.0)
Immature Granulocytes: 0 %
Lymphocytes Relative: 37 %
Lymphs Abs: 2.5 10*3/uL (ref 0.7–4.0)
MCH: 30 pg (ref 26.0–34.0)
MCHC: 33.6 g/dL (ref 30.0–36.0)
MCV: 89 fL (ref 80.0–100.0)
Monocytes Absolute: 0.7 10*3/uL (ref 0.1–1.0)
Monocytes Relative: 10 %
Neutro Abs: 3.3 10*3/uL (ref 1.7–7.7)
Neutrophils Relative %: 50 %
Platelets: 266 10*3/uL (ref 150–400)
RBC: 4.74 MIL/uL (ref 4.22–5.81)
RDW: 12.6 % (ref 11.5–15.5)
WBC: 6.7 10*3/uL (ref 4.0–10.5)
nRBC: 0 % (ref 0.0–0.2)

## 2023-02-07 LAB — COMPREHENSIVE METABOLIC PANEL
ALT: 32 U/L (ref 0–44)
AST: 32 U/L (ref 15–41)
Albumin: 3.7 g/dL (ref 3.5–5.0)
Alkaline Phosphatase: 93 U/L (ref 38–126)
Anion gap: 8 (ref 5–15)
BUN: 22 mg/dL (ref 8–23)
CO2: 25 mmol/L (ref 22–32)
Calcium: 8.8 mg/dL — ABNORMAL LOW (ref 8.9–10.3)
Chloride: 104 mmol/L (ref 98–111)
Creatinine, Ser: 1.03 mg/dL (ref 0.61–1.24)
GFR, Estimated: >60 mL/min (ref >60)
Glucose, Bld: 105 mg/dL — ABNORMAL HIGH (ref 70–99)
Potassium: 3.7 mmol/L (ref 3.5–5.1)
Sodium: 137 mmol/L (ref 135–145)
Total Bilirubin: 0.8 mg/dL (ref 0.3–1.2)
Total Protein: 6.6 g/dL (ref 6.5–8.1)

## 2023-02-07 MED ORDER — DIPHENHYDRAMINE HCL 25 MG PO CAPS: 25.0000 mg | ORAL_CAPSULE | ORAL | Status: DC

## 2023-02-07 MED ORDER — SODIUM CHLORIDE 0.9 % IV SOLN
750.0000 mg | INTRAVENOUS | Status: DC
  Administered 2023-02-07: 750 mg via INTRAVENOUS
  Filled 2023-02-07: qty 30

## 2023-02-07 MED ORDER — ACETAMINOPHEN 325 MG PO TABS: 650.0000 mg | ORAL_TABLET | ORAL | Status: DC

## 2023-02-07 NOTE — Progress Notes (Signed)
Next infusion scheduled for Orencia IV (U0454) on 03/07/23 and due for updated orders. Diagnosis: RA  Dose: 750mg  every 4 weeks  Last Clinic Visit: 12/19/22 Next Clinic Visit: 06/01/23  Last infusion: 02/07/23  Labs: CBC and CMP on 02/07/23 TB Gold: negative on 07/19/22   Orders placed for Orencia IV (J0129) x 3 doses along with premedication of acetaminophen and diphenhydramine to be administered 30 minutes before medication infusion.  Standing CBC with diff/platelet and CMP with GFR orders placed to be drawn every 2 months.  Next TB gold due 07/19/2023  Chesley Mires, PharmD, MPH, BCPS, CPP Clinical Pharmacist (Rheumatology and Pulmonology)

## 2023-02-07 NOTE — Progress Notes (Signed)
Calcium is borderline low 8.8. please clarify if he has been taking a calcium supplement? rest of CMP WNL.

## 2023-02-07 NOTE — Progress Notes (Signed)
CBC WNL

## 2023-02-09 ENCOUNTER — Ambulatory Visit (INDEPENDENT_AMBULATORY_CARE_PROVIDER_SITE_OTHER): Payer: Medicare Other | Admitting: *Deleted

## 2023-02-09 ENCOUNTER — Ambulatory Visit: Payer: Medicare Other | Attending: Cardiovascular Disease | Admitting: Cardiovascular Disease

## 2023-02-09 ENCOUNTER — Encounter: Payer: Self-pay | Admitting: Cardiovascular Disease

## 2023-02-09 VITALS — BP 108/76 | HR 80 | Ht 66.0 in | Wt 208.0 lb

## 2023-02-09 DIAGNOSIS — I4891 Unspecified atrial fibrillation: Secondary | ICD-10-CM | POA: Insufficient documentation

## 2023-02-09 DIAGNOSIS — Z5181 Encounter for therapeutic drug level monitoring: Secondary | ICD-10-CM

## 2023-02-09 LAB — POCT INR: POC INR: 2.1

## 2023-02-09 NOTE — Patient Instructions (Signed)
Medication Instructions:  Your physician recommends that you continue on your current medications as directed. Please refer to the Current Medication list given to you today.  *If you need a refill on your cardiac medications before your next appointment, please call your pharmacy*  Lab Work: BMET and CBC prior to CT scan and ablation  Testing/Procedures: Your physician has requested that you have cardiac CT. Cardiac computed tomography (CT) is a painless test that uses an x-ray machine to take clear, detailed pictures of your heart. For further information please visit [email or web address]   Your physician has recommended that you have an ablation. Catheter ablation is a medical procedure used to treat some cardiac arrhythmias (irregular heartbeats). During catheter ablation, a long, thin, flexible tube is put into a blood vessel in your groin (upper thigh), or neck. This tube is called an ablation catheter. It is then guided to your heart through the blood vessel. Radio frequency waves destroy small areas of heart tissue where abnormal heartbeats [place] cause an arrhythmia to start.   Follow-Up: At [facility], you and your health needs are our priority.  As part of our continuing mission to provide you with exceptional heart care, we have created designated Provider Care Teams.  These Care Teams include your primary Cardiologist (physician) and Advanced Practice Providers (APPs -  Physician Assistants and Nurse Practitioners) who all work together to provide you with the care you need, when you need it.  Your next appointment:   We will call you to schedule your ablation and follow up

## 2023-02-09 NOTE — Progress Notes (Signed)
Electrophysiology Office Note:    Date:  02/09/2023   ID:  Joel Cower Mehlberg Sr., DOB October 24, 1945, MRN [id number]  PCP:  [name], MD   [facility] Providers Cardiologist:  Olga Millers, MD Electrophysiologist:  Maurice Small, MD     Referring MD: Danice Goltz, [place]   History of Present Illness:    Joel Richeson Christmas Sr. is a 77 y.o. male with a medical history significant for persistent atrial fibrillation, sinus bradycardia, rheumatoid arthritis, type 2 diabetes, referred for management of atrial fibrillation.     Discussed the use of AI scribe software for clinical note transcription with the patient, who gave verbal consent to proceed.  History of Present Illness   The patient, diagnosed with atrial fibrillation in [place] 2024, initially presented with fatigue. An EKG confirmed rate-controlled atrial fibrillation. Despite undergoing DC cardioversion in June 2024, the patient reverted back to atrial fibrillation by July 2024. The patient reported feeling better briefly after the cardioversion. A TTE in July 2024 showed an ejection fraction of 60-65% and mild bilateral axial dilation. The patient has a CHA2DS2-VASc score of 5, indicating a high risk of stroke, and is on warfarin for anticoagulation. The patient is not taking Eliquis due to cost considerations. The patient has a history of coronary calcification and sinus bradycardia.            Today, He is doing reasonably well. He has his baseline fatigue. No syncope, pre-syncope, chest pain.  EKGs/Labs/Other Studies Reviewed Today:     Echocardiogram:  TTE November 27, 2022 Ejection fraction 60 to 65%.  Mild bilateral atrial dilation   Monitors:   Stress testing:   Advanced imaging:   Cardiac catherization   EKG:   EKG Interpretation Date/Time:  Friday January 26 2023 10:55:55 EDT Ventricular Rate:  80 PR Interval:    QRS Duration:  84 QT Interval:  376 QTC Calculation: 433 R Axis:   20  Text  Interpretation: Atrial fibrillation Nonspecific ST and T wave abnormality When compared with ECG of 19-Dec-2022 14:13, No significant change was found Confirmed by York Pellant [phone]) on 02/09/2023 9:01:27 AM     Physical Exam:    VS:  BP 108/76   Pulse 80   Ht 5\' 6"  (1.676 m)   Wt 208 lb (94.3 kg)   SpO2 95%   BMI 33.57 kg/m     Wt Readings from Last 3 Encounters:  02/09/23 208 lb (94.3 kg)  02/07/23 206 lb (93.4 kg)  01/10/23 206 lb 14.4 oz (93.8 kg)     GEN: Well nourished, well developed in no acute distress CARDIAC: RRR, no murmurs, rubs, gallops RESPIRATORY:  Normal work of breathing MUSCULOSKELETAL: no edema    ASSESSMENT & PLAN:     Persistent Atrial fibrillation Diagnosed [place] 2024 Symptomatic with fatigue Antiarrhythmic drug options are limited --he has coronary calcification, sinus bradycardia We discussed management options. I recommended rhythm control. We discussed ablation and tikosyn as an alternative.  We discussed the indication, rationale, logistics, anticipated benefits, and potential risks of the ablation procedure including but not limited to -- bleed at the groin access site, chest pain, damage to nearby organs such as the diaphragm, lungs, or esophagus, need for a drainage tube, or prolonged hospitalization. I explained that the risk for stroke, heart attack, need for open chest surgery, or even death is very low but not zero. he  expressed understanding and wishes to proceed.   Secondary hypercoagulable state CHA2DS2-VASc score is  5 Continue warfarin --he is not taking Eliquis due to cost  Sinus bradycardia Limits therapeutic options somewhat      Signed, Maurice Small, MD  02/09/2023 9:25 AM    [facility]

## 2023-02-09 NOTE — Patient Instructions (Signed)
Description   Continue taking warfarin 1 tablet daily except for 1/2 a tablet on Monday, Wednesday and Friday. Stay consistent with greens each week (1 per week). Recheck INR in 2 weeks.  Coumadin Clinic [phone]

## 2023-02-23 ENCOUNTER — Ambulatory Visit: Payer: Medicare Other | Attending: Cardiology | Admitting: *Deleted

## 2023-02-23 DIAGNOSIS — I4891 Unspecified atrial fibrillation: Secondary | ICD-10-CM | POA: Insufficient documentation

## 2023-02-23 DIAGNOSIS — Z5181 Encounter for therapeutic drug level monitoring: Secondary | ICD-10-CM | POA: Diagnosis not present

## 2023-02-23 DIAGNOSIS — Z7901 Long term (current) use of anticoagulants: Secondary | ICD-10-CM | POA: Insufficient documentation

## 2023-02-23 LAB — POCT INR: INR: 1.6 — AB (ref 2.0–3.0)

## 2023-02-23 NOTE — Patient Instructions (Signed)
Description   Today take 1 tablet of warfarin then continue taking warfarin 1 tablet daily except for 1/2 tablet on Monday, Wednesday and Friday. Stay consistent with greens each week (1 per week). Recheck INR in 2 weeks.  Coumadin Clinic [phone]

## 2023-02-28 ENCOUNTER — Telehealth: Payer: Self-pay

## 2023-02-28 NOTE — Telephone Encounter (Signed)
Spoke with patient, ablation scheduled on 05/23/23 with Dr Nelly Laurence. Patient would like to discuss ablation further with [facility] beforehand, scheduled appt on 04/26/23, we will get labs at that visit. Patient on coumadin, sent anticoag clinic [facility] a staff message to inform of upcoming ablation. No further needs at this time

## 2023-02-28 NOTE — Telephone Encounter (Signed)
Hoover, Chryl Heck, RN  P Cv Div Nl Anticoag; P Cv Div Heartcare Pre Cert/Auth; P Cvd-Ep Scheduling; Burnadette Peter Peggye Ley Good afternoon, This patient is scheduled for an AF ablation on 05/23/23 at 230 pm with Dr Nelly Laurence. CT scan needs to be scheduled. Lab to be completed at office visit on 04/26/23. Patient on coumadin and will need weekly INR checks at least 3 weeks prior to ablation.  Received message above. Pt has scheduled appt with Coumadin Clinic on 03/09/23. Weekly checks will need to begin on 05/02/23 for requested weekly INR checks 3 weeks prior to procedure. Note placed on upcoming coumadin clinic appt to make RN aware.

## 2023-03-01 NOTE — Progress Notes (Signed)
HPI: FU CAD and PAF. Cardiac catheterization in 2004 showed nonobstructive disease. Stress echocardiogram in November of 2013 showed normal LV function and no ischemia. Abdominal ultrasound showed no aneurysm and no acute hepatobiliary findings. Nuclear study November 2015 showed an ejection fraction of 63% and no ischemia or infarction. Carotid Dopplers October 2017 showed minimal amount of plaque bilaterally.  Recently diagnosed with atrial fibrillation and underwent cardioversion June 2024.  However atrial fibrillation recurred and he is scheduled for ablation.  Echocardiogram July 2024 showed normal LV function, mild left ventricular hypertrophy, mild biatrial enlargement, moderate to severe tricuspid regurgitation.  Since last seen patient has fatigue related to atrial fibrillation.  He also has chest tightness that radiates to his left shoulder.  It occurs both with exertion and at rest.  It improved when he was cardioverted and in sinus rhythm from 1 week.  When atrial fibrillation recurred he states it worsened again.  No associated symptoms.  Lasts 15 to 20 minutes and resolved spontaneously.  He denies dyspnea on exertion, orthopnea, PND or pedal edema.  No syncope.  Current Outpatient Medications  Medication Sig Dispense Refill   Abatacept (ORENCIA IV) Inject 750 mg into the vein every 28 (twenty-eight) days.     amLODipine (NORVASC) 5 MG tablet TAKE 1 TABLET EVERY DAY 90 tablet 3   Ascorbic Acid (VITAMIN C PO) Take 1,000 mg by mouth daily.     Calcium Carbonate-Vit D-Min (CALCIUM 1200 PO) Take 1,200 mg by mouth daily.     Cholecalciferol (VITAMIN D3) 125 MCG (5000 UT) TABS Take 5,000 Units by mouth daily.     insulin aspart (NOVOLOG) 100 UNIT/ML FlexPen Inject 1 Units into the skin continuous. 1 unit per hour     Insulin Aspart, w/Niacinamide, (FIASP) 100 UNIT/ML SOLN 100 Units. Insulin pump     insulin glargine (LANTUS) 100 UNIT/ML injection Inject 16 Units into the skin as needed (If  pump goes out).     Insulin Human (INSULIN PUMP) SOLN Inject into the skin continuous. Fiasp 77 units     KRILL OIL 1000 MG CAPS Take 1,000 mg by mouth daily.     loratadine (CLARITIN) 10 MG tablet Take 10 mg by mouth daily.     Multiple Vitamin (MULTIVITAMIN WITH MINERALS) TABS Take 1 tablet by mouth daily.     ONE TOUCH ULTRA TEST test strip USE WHEN CHECKING BLOOD SUGAR QID.  3   pantoprazole (PROTONIX) 40 MG tablet TAKE 1 TABLET DAILY. NEED OFFICE VISIT AND LABS BEFORE FURTHER REFILLS 90 tablet 0   Potassium 99 MG TABS Take 99 mg by mouth daily.     pravastatin (PRAVACHOL) 40 MG tablet TAKE 1 TABLET EVERY EVENING 90 tablet 3   ramipril (ALTACE) 5 MG capsule Take 1 capsule (5 mg total) by mouth daily. 90 capsule 3   vitamin E 400 UNIT capsule Take 400 Units by mouth daily.     warfarin (COUMADIN) 5 MG tablet TAKE 1 TABLET BY MOUTH DAILY OR AS DIRECTED BY ANTICOAGULATION CLINIC 30 tablet 1   zinc gluconate 50 MG tablet Take 50 mg by mouth daily.     No current facility-administered medications for this visit.     Past Medical History:  Diagnosis Date   A-fib (HCC)    AKI (acute kidney injury) (HCC) 05/18/2021   Anemia    Arthritis    "all over"   Bradycardia    Chronic bronchitis (HCC)    "get it just about q yr" (  03/17/2014)   Daily headache    "here lately" (03/17/2014); relates to sinuses   GERD (gastroesophageal reflux disease)    Hard of hearing    hearing aides bilat   Hiatal hernia    History of blood transfusion 2008   "related to OR"   History of kidney stones    Hyperlipidemia    Pneumonia 1972 X 1   Rheumatoid arthritis (HCC)    Type II diabetes mellitus (HCC)    Wears glasses     Past Surgical History:  Procedure Laterality Date   broken finger      surgical repaired left hand 2nd finger    CARDIOVERSION N/A 11/08/2022   Procedure: CARDIOVERSION;  Surgeon: Maisie Fus, MD;  Location: [facility];  Service: Cardiovascular;  Laterality: N/A;   cyst  removed      per left knee/posteriorly   CYSTECTOMY Left 2009   "behind knee"   INGUINAL HERNIA REPAIR Right ?2010   JOINT REPLACEMENT     KNEE ARTHROSCOPY Bilateral 1980's   PARTIAL KNEE ARTHROPLASTY Right 08/27/2015   Procedure: RIGHT UNICOMPARTMENTAL KNEE ARTHROPLASTY;  Surgeon: Kathryne Hitch, MD;  Location: [facility];  Service: Orthopedics;  Laterality: Right;   REVISION TOTAL KNEE ARTHROPLASTY Left 2008   SHOULDER SURGERY Left 2019   TOTAL KNEE ARTHROPLASTY Left 2003    Social History   Socioeconomic History   Marital status: Married    Spouse name: Genevie Cheshire   Number of children: 3   Years of education: College   Highest education level: Not on file  Occupational History    Employer: UNEMPLOYED  Tobacco Use   Smoking status: Former    Current packs/day: 0.00    Average packs/day: 1 pack/day for 5.0 years (5.0 ttl pk-yrs)    Types: Cigarettes, Cigars    Start date: 05/16/1963    Quit date: 05/15/1968    Years since quitting: 54.8    Passive exposure: Never   Smokeless tobacco: Never  Vaping Use   Vaping status: Never Used  Substance and Sexual Activity   Alcohol use: No    Alcohol/week: 0.0 standard drinks of alcohol   Drug use: Never   Sexual activity: Yes  Other Topics Concern   Not on file  Social History Narrative   Patient lives at home with family.   Caffeine Use: occasionally   Social Determinants of Health   Financial Resource Strain: Not on file  Food Insecurity: Not on file  Transportation Needs: Not on file  Physical Activity: Not on file  Stress: Not on file  Social Connections: Not on file  Intimate Partner Violence: Not on file    Family History  Problem Relation Age of Onset   Cancer Father        prostate    ROS: no fevers or chills, productive cough, hemoptysis, dysphasia, odynophagia, melena, hematochezia, dysuria, hematuria, rash, seizure activity, orthopnea, PND, pedal edema, claudication. Remaining systems are  negative.  Physical Exam: Well-developed well-nourished in no acute distress.  Skin is warm and dry.  HEENT is normal.  Neck is supple.  Chest is clear to auscultation with normal expansion.  Cardiovascular exam is irregular Abdominal exam nontender or distended. No masses palpated. Extremities show no edema. neuro grossly intact  Electrocardiogram today shows atrial fibrillation with controlled ventricular response and no diagnostic ST changes; personally reviewed.  A/P  1 persistent atrial fibrillation-patient is scheduled for ablation.  Continue Coumadin.  2 history of mild coronary artery disease-Continue statin.  3 hypertension-blood pressure controlled.  Continue present medications.  4 hyperlipidemia-continue statin.  5 history of bradycardia-patient denies any syncope.  Avoid AV nodal blocking agents.  6 tricuspid regurgitation-moderate to severe on recent echocardiogram.  Will plan repeat study once sinus is reestablished in 6 to 12 months.  7 chest pain-patient has chest tightness when he is in atrial fibrillation but this improved when sinus was restored.  Symptoms with both typical and atypical features.  No ST changes noted on ECG.  Will plan to screen for ischemia with stress nuclear study.  Olga Millers, MD

## 2023-03-07 ENCOUNTER — Ambulatory Visit (HOSPITAL_COMMUNITY)
Admission: RE | Admit: 2023-03-07 | Discharge: 2023-03-07 | Disposition: A | Discharge status: Home / Self Care | Payer: Medicare Other | Source: Ambulatory Visit | Attending: Rheumatology | Admitting: Rheumatology

## 2023-03-07 DIAGNOSIS — M0579 Rheumatoid arthritis with rheumatoid factor of multiple sites without organ or systems involvement: Secondary | ICD-10-CM | POA: Insufficient documentation

## 2023-03-07 DIAGNOSIS — Z79899 Other long term (current) drug therapy: Secondary | ICD-10-CM | POA: Diagnosis not present

## 2023-03-07 MED ORDER — DIPHENHYDRAMINE HCL 25 MG PO CAPS: 25.0000 mg | ORAL_CAPSULE | ORAL | Status: DC

## 2023-03-07 MED ORDER — SODIUM CHLORIDE 0.9 % IV SOLN
750.0000 mg | INTRAVENOUS | Status: DC
  Administered 2023-03-07: 750 mg via INTRAVENOUS
  Filled 2023-03-07: qty 30

## 2023-03-07 MED ORDER — ACETAMINOPHEN 325 MG PO TABS: 650.0000 mg | ORAL_TABLET | ORAL | Status: DC

## 2023-03-09 ENCOUNTER — Ambulatory Visit: Payer: Medicare Other | Attending: General Practice | Admitting: *Deleted

## 2023-03-09 DIAGNOSIS — Z5181 Encounter for therapeutic drug level monitoring: Secondary | ICD-10-CM

## 2023-03-09 DIAGNOSIS — I4891 Unspecified atrial fibrillation: Secondary | ICD-10-CM | POA: Diagnosis not present

## 2023-03-09 LAB — POCT INR: POC INR: 1.6

## 2023-03-09 NOTE — Patient Instructions (Signed)
Description   Today take 1 tablet of warfarin then START taking warfarin 1 tablet daily except for 1/2 tablet on Mondays and Fridays. Stay consistent with greens each week (1 per week). Recheck INR in 2 weeks.  Coumadin Clinic [phone]

## 2023-03-15 ENCOUNTER — Encounter: Payer: Self-pay | Admitting: Cardiology

## 2023-03-15 ENCOUNTER — Ambulatory Visit: Payer: Medicare Other | Attending: Cardiology | Admitting: Cardiology

## 2023-03-15 VITALS — BP 118/74 | HR 90 | Ht 66.0 in | Wt 208.8 lb

## 2023-03-15 DIAGNOSIS — I10 Essential (primary) hypertension: Secondary | ICD-10-CM | POA: Diagnosis not present

## 2023-03-15 DIAGNOSIS — R072 Precordial pain: Secondary | ICD-10-CM

## 2023-03-15 DIAGNOSIS — I4819 Other persistent atrial fibrillation: Secondary | ICD-10-CM

## 2023-03-15 DIAGNOSIS — E782 Mixed hyperlipidemia: Secondary | ICD-10-CM

## 2023-03-15 NOTE — Patient Instructions (Addendum)
   Testing/Procedures: Your physician has requested that you have a lexiscan myoview. For further information please visit [email or web address] Please follow instruction sheet, as given. [address].   Follow-Up: At [facility], you and your health needs are our priority.  As part of our continuing mission to provide you with exceptional heart care, we have created designated Provider Care Teams.  These Care Teams include your primary Cardiologist (physician) and Advanced Practice Providers (APPs -  Physician Assistants and Nurse Practitioners) who all work together to provide you with the care you need, when you need it.  We recommend signing up for the patient portal called "MyChart".  Sign up information is provided on this After Visit Summary.  MyChart is used to connect with patients for Virtual Visits (Telemedicine).  Patients are able to view lab/test results, encounter notes, upcoming appointments, etc.  Non-urgent messages can be sent to your provider as well.   To learn more about what you can do with MyChart, go to [email or web address].    Your next appointment:   4 month(s)  Provider:   Olga Millers, MD  {

## 2023-03-16 ENCOUNTER — Telehealth (HOSPITAL_COMMUNITY): Payer: Self-pay | Admitting: *Deleted

## 2023-03-16 NOTE — Telephone Encounter (Signed)
Patient given detailed instructions per Myocardial Perfusion Study Information Sheet for the test on 03/20/2023 at 7:45. Patient notified to arrive 15 minutes early and that it is imperative to arrive on time for appointment to keep from having the test rescheduled.  If you need to cancel or reschedule your appointment, please call the office within 24 hours of your appointment. . Patient verbalized understanding.Joel Jones

## 2023-03-20 ENCOUNTER — Ambulatory Visit (HOSPITAL_COMMUNITY): Payer: Medicare Other | Attending: Cardiology

## 2023-03-20 DIAGNOSIS — R072 Precordial pain: Secondary | ICD-10-CM | POA: Diagnosis not present

## 2023-03-20 DIAGNOSIS — I4819 Other persistent atrial fibrillation: Secondary | ICD-10-CM | POA: Diagnosis not present

## 2023-03-20 LAB — MYOCARDIAL PERFUSION IMAGING
LV dias vol: 104 mL (ref 62–150)
LV sys vol: 50 mL
Nuc Stress EF: 52 %
Peak HR: 111 {beats}/min
Rest HR: 64 {beats}/min
Rest Nuclear Isotope Dose: 8.7 mCi
SDS: 3
SRS: 0
SSS: 3
ST Depression (mm): 0 mm
Stress Nuclear Isotope Dose: 26.2 mCi
TID: 1.04

## 2023-03-20 MED ORDER — TECHNETIUM TC 99M TETROFOSMIN IV KIT
8.7000 | PACK | Freq: Once | INTRAVENOUS | Status: AC | PRN
  Administered 2023-03-20: 8.7 via INTRAVENOUS

## 2023-03-20 MED ORDER — TECHNETIUM TC 99M TETROFOSMIN IV KIT
26.2000 | PACK | Freq: Once | INTRAVENOUS | Status: AC | PRN
  Administered 2023-03-20: 26.2 via INTRAVENOUS

## 2023-03-20 MED ORDER — REGADENOSON 0.4 MG/5ML IV SOLN
0.4000 mg | Freq: Once | INTRAVENOUS | Status: AC
  Administered 2023-03-20: 0.4 mg via INTRAVENOUS

## 2023-03-23 ENCOUNTER — Ambulatory Visit: Payer: Medicare Other | Attending: Cardiovascular Disease

## 2023-03-23 DIAGNOSIS — Z7901 Long term (current) use of anticoagulants: Secondary | ICD-10-CM | POA: Diagnosis not present

## 2023-03-23 DIAGNOSIS — Z5181 Encounter for therapeutic drug level monitoring: Secondary | ICD-10-CM

## 2023-03-23 DIAGNOSIS — I4891 Unspecified atrial fibrillation: Secondary | ICD-10-CM

## 2023-03-23 LAB — POCT INR: INR: 2.1 (ref 2.0–3.0)

## 2023-03-23 NOTE — Patient Instructions (Signed)
Continue taking warfarin 1 tablet daily except for 1/2 tablet on Mondays and Fridays. Stay consistent with greens each week (1 per week). Recheck INR in 6 weeks. Ablation 05/23/2023-Needs weekly INR checkes starting 3 weeks prior.  Coumadin Clinic [phone]

## 2023-03-24 ENCOUNTER — Other Ambulatory Visit: Payer: Self-pay | Admitting: General Practice

## 2023-03-24 DIAGNOSIS — Z5181 Encounter for therapeutic drug level monitoring: Secondary | ICD-10-CM

## 2023-03-24 DIAGNOSIS — I4891 Unspecified atrial fibrillation: Secondary | ICD-10-CM

## 2023-04-04 ENCOUNTER — Ambulatory Visit (HOSPITAL_COMMUNITY)
Admission: RE | Admit: 2023-04-04 | Discharge: 2023-04-04 | Disposition: A | Discharge status: Home / Self Care | Payer: Medicare Other | Source: Ambulatory Visit | Attending: Rheumatology | Admitting: Rheumatology

## 2023-04-04 DIAGNOSIS — Z79899 Other long term (current) drug therapy: Secondary | ICD-10-CM | POA: Insufficient documentation

## 2023-04-04 DIAGNOSIS — M0579 Rheumatoid arthritis with rheumatoid factor of multiple sites without organ or systems involvement: Secondary | ICD-10-CM | POA: Insufficient documentation

## 2023-04-04 LAB — COMPREHENSIVE METABOLIC PANEL
ALT: 34 U/L (ref 0–44)
AST: 34 U/L (ref 15–41)
Albumin: 3.8 g/dL (ref 3.5–5.0)
Alkaline Phosphatase: 96 U/L (ref 38–126)
Anion gap: 6 (ref 5–15)
BUN: 21 mg/dL (ref 8–23)
CO2: 27 mmol/L (ref 22–32)
Calcium: 9 mg/dL (ref 8.9–10.3)
Chloride: 106 mmol/L (ref 98–111)
Creatinine, Ser: 1.2 mg/dL (ref 0.61–1.24)
GFR, Estimated: >60 mL/min (ref >60)
Glucose, Bld: 73 mg/dL (ref 70–99)
Potassium: 4 mmol/L (ref 3.5–5.1)
Sodium: 139 mmol/L (ref 135–145)
Total Bilirubin: 0.8 mg/dL (ref <1.2)
Total Protein: 6.5 g/dL (ref 6.5–8.1)

## 2023-04-04 LAB — CBC WITH DIFFERENTIAL/PLATELET
Abs Immature Granulocytes: 0.01 10*3/uL (ref 0.00–0.07)
Basophils Absolute: 0.1 10*3/uL (ref 0.0–0.1)
Basophils Relative: 1 %
Eosinophils Absolute: 0.1 10*3/uL (ref 0.0–0.5)
Eosinophils Relative: 2 %
HCT: 42.7 % (ref 39.0–52.0)
Hemoglobin: 14.6 g/dL (ref 13.0–17.0)
Immature Granulocytes: 0 %
Lymphocytes Relative: 37 %
Lymphs Abs: 2.2 10*3/uL (ref 0.7–4.0)
MCH: 30.5 pg (ref 26.0–34.0)
MCHC: 34.2 g/dL (ref 30.0–36.0)
MCV: 89.3 fL (ref 80.0–100.0)
Monocytes Absolute: 0.6 10*3/uL (ref 0.1–1.0)
Monocytes Relative: 11 %
Neutro Abs: 2.9 10*3/uL (ref 1.7–7.7)
Neutrophils Relative %: 49 %
Platelets: 231 10*3/uL (ref 150–400)
RBC: 4.78 MIL/uL (ref 4.22–5.81)
RDW: 13 % (ref 11.5–15.5)
WBC: 5.9 10*3/uL (ref 4.0–10.5)
nRBC: 0 % (ref 0.0–0.2)

## 2023-04-04 MED ORDER — SODIUM CHLORIDE 0.9 % IV SOLN
750.0000 mg | INTRAVENOUS | Status: DC
  Administered 2023-04-04: 750 mg via INTRAVENOUS
  Filled 2023-04-04: qty 30

## 2023-04-04 MED ORDER — ACETAMINOPHEN 325 MG PO TABS: 650.0000 mg | ORAL_TABLET | ORAL | Status: DC

## 2023-04-04 MED ORDER — DIPHENHYDRAMINE HCL 25 MG PO CAPS: 25.0000 mg | ORAL_CAPSULE | ORAL | Status: DC

## 2023-04-04 NOTE — Progress Notes (Signed)
CBC and CMP are normal.

## 2023-04-18 ENCOUNTER — Ambulatory Visit: Payer: Medicare Other | Attending: Cardiovascular Disease | Admitting: *Deleted

## 2023-04-18 DIAGNOSIS — I4891 Unspecified atrial fibrillation: Secondary | ICD-10-CM | POA: Diagnosis not present

## 2023-04-18 DIAGNOSIS — Z7901 Long term (current) use of anticoagulants: Secondary | ICD-10-CM | POA: Diagnosis not present

## 2023-04-18 DIAGNOSIS — Z5181 Encounter for therapeutic drug level monitoring: Secondary | ICD-10-CM | POA: Insufficient documentation

## 2023-04-18 LAB — POCT INR: INR: 2.7 (ref 2.0–3.0)

## 2023-04-18 NOTE — Patient Instructions (Signed)
Description   Continue taking warfarin 1 tablet daily except for 1/2 tablet on Mondays and Fridays. Stay consistent with greens each week (1 per week). Recheck INR in 1 week pending Ablation. Ablation 05/23/2023-Needs weekly INR checks starting 3 weeks prior.  Coumadin Clinic [phone]

## 2023-04-23 NOTE — Progress Notes (Unsigned)
  Electrophysiology Office Note:   Date:  04/23/2023  ID:  Joel Cower Weinel Sr., DOB 1945-10-14, MRN [id number]  Primary Cardiologist: Olga Millers, MD Electrophysiologist: Maurice Small, MD  {Click to update primary MD,subspecialty MD or APP then REFRESH:1}    History of Present Illness:   Joel Weideman Joel Sr. is a 77 y.o. male with h/o AF, bradycardia, RA, DM II seen today for routine electrophysiology followup.   Since last being seen in our clinic the patient reports doing ***.  he denies chest pain, palpitations, dyspnea, PND, orthopnea, nausea, vomiting, dizziness, syncope, edema, weight gain, or early satiety.   Review of systems complete and found to be negative unless listed in HPI.   EP Information / Studies Reviewed:    {EKGtoday:28818}      Studies:  ECHO 11/2022 > LVEF 60-65%, mild bi-atrial dilation     Arrhythmia / AAD AF > initial dx 09/2022  AAD limited due to SB & coronary calcifications     Risk Assessment/Calculations:    CHA2DS2-VASc Score = 5  {Confirm score is correct.  If not, click here to update score.  REFRESH note.  :1} This indicates a 7.2% annual risk of stroke. The patient's score is based upon: CHF History: 0 HTN History: 1 Diabetes History: 1 Stroke History: 0 Vascular Disease History: 1 (CAD/aortic atherosclerosis) Age Score: 2 Gender Score: 0   {This patient has a significant risk of stroke if diagnosed with atrial fibrillation.  Please consider VKA or DOAC agent for anticoagulation if the bleeding risk is acceptable.   You can also use the SmartPhrase .HCCHADSVASC for documentation.   :[id number]} No BP recorded.  {Refresh Note OR Click here to enter BP  :1}***        Physical Exam:   VS:  There were no vitals taken for this visit.   Wt Readings from Last 3 Encounters:  04/04/23 208 lb (94.3 kg)  03/20/23 208 lb (94.3 kg)  03/15/23 208 lb 12.8 oz (94.7 kg)     GEN: Well nourished, well developed in no acute distress NECK: No JVD; No  carotid bruits CARDIAC: {EPRHYTHM:28826}, no murmurs, rubs, gallops RESPIRATORY:  Clear to auscultation without rales, wheezing or rhonchi  ABDOMEN: Soft, non-tender, non-distended EXTREMITIES:  No edema; No deformity   ASSESSMENT AND PLAN:    Persistent Atrial Fibrillation  CHA2DS2-VASc 5. Normal LVEF.  -planned ablation 05/23/23  -instructions reviewed with patient  -pre-procedure labs > CBC, BMP   Secondary Hypercoagulable State  -continue coumadin per coumadin clinic   Sinus Bradycardia  -not on AV nodal blockers   Hypertension  -well controlled on current regimen ***   Follow up with Dr. Nelly Laurence  as planned for AF ablation    Signed, Canary Brim, MSN, APRN, NP-C, AGACNP-BC [facility] - Electrophysiology  04/23/2023, 5:25 PM

## 2023-04-24 ENCOUNTER — Encounter: Payer: Self-pay | Admitting: *Deleted

## 2023-04-24 ENCOUNTER — Encounter: Payer: Self-pay | Admitting: Pulmonary Disease

## 2023-04-24 ENCOUNTER — Other Ambulatory Visit: Payer: Self-pay

## 2023-04-24 ENCOUNTER — Ambulatory Visit: Payer: Medicare Other | Attending: Pulmonary Disease | Admitting: Pulmonary Disease

## 2023-04-24 ENCOUNTER — Telehealth: Payer: Self-pay

## 2023-04-24 DIAGNOSIS — I4819 Other persistent atrial fibrillation: Secondary | ICD-10-CM | POA: Diagnosis not present

## 2023-04-24 DIAGNOSIS — D6869 Other thrombophilia: Secondary | ICD-10-CM | POA: Diagnosis not present

## 2023-04-24 DIAGNOSIS — I10 Essential (primary) hypertension: Secondary | ICD-10-CM | POA: Diagnosis not present

## 2023-04-24 LAB — CBC

## 2023-04-24 NOTE — Patient Instructions (Signed)
Medication Instructions:    Your physician recommends that you continue on your current medications as directed. Please refer to the Current Medication list given to you today.   *If you need a refill on your cardiac medications before your next appointment, please call your pharmacy*    Lab Work:  PLEASE GO DOWN STAIRS FIRST FLOOR  [address] LABS NEEDED BMET CBC AND PT-INR TODAY    If you have labs (blood work) drawn today and your tests are completely normal, you will receive your results only by: MyChart Message (if you have MyChart) OR A paper copy in the mail If you have any lab test that is abnormal or we need to change your treatment, we will call you to review the results.    Follow-Up: At [facility], you and your health needs are our priority.  As part of our continuing mission to provide you with exceptional heart care, we have created designated Provider Care Teams.  These Care Teams include your primary Cardiologist (physician) and Advanced Practice Providers (APPs -  Physician Assistants and Nurse Practitioners) who all work together to provide you with the care you need, when you need it.  We recommend signing up for the patient portal called "MyChart".  Sign up information is provided on this After Visit Summary.  MyChart is used to connect with patients for Virtual Visits (Telemedicine).  Patients are able to view lab/test results, encounter notes, upcoming appointments, etc.  Non-urgent messages can be sent to your provider as well.   To learn more about what you can do with MyChart, go to [email or web address].    Your next appointment:   3 month(s)  Provider:  WILL BE DONE AT HOSPITAL POST- PROCEDURE    Other Instructions

## 2023-04-24 NOTE — Telephone Encounter (Signed)
Pt was seen by Canary Brim, NP today for pre-ablation visit.  I went to the pod to go over instructions with pt but he had already left. I went downstairs to [facility] and spoke to him and his Wife.  I went over detailed CT and Ablation instructions.  Pt has an insulin pump - he has an appointment tomorrow with his Endocrinologist and I suggested he discuss with him how to control his Insulin the night before and day of procedure. He will take his instruction letter with him to that appt and get clear instructions on how to manage it.   I gave pt my direct number to call if he has any questions or concerns.

## 2023-04-25 ENCOUNTER — Ambulatory Visit (HOSPITAL_COMMUNITY)
Admission: RE | Admit: 2023-04-25 | Discharge: 2023-04-25 | Disposition: A | Discharge status: Home / Self Care | Payer: Medicare Other | Source: Ambulatory Visit | Attending: Cardiovascular Disease | Admitting: Cardiovascular Disease

## 2023-04-25 ENCOUNTER — Ambulatory Visit (INDEPENDENT_AMBULATORY_CARE_PROVIDER_SITE_OTHER): Payer: Self-pay

## 2023-04-25 DIAGNOSIS — Z794 Long term (current) use of insulin: Secondary | ICD-10-CM | POA: Diagnosis not present

## 2023-04-25 DIAGNOSIS — I4891 Unspecified atrial fibrillation: Secondary | ICD-10-CM

## 2023-04-25 DIAGNOSIS — I10 Essential (primary) hypertension: Secondary | ICD-10-CM | POA: Diagnosis not present

## 2023-04-25 DIAGNOSIS — N182 Chronic kidney disease, stage 2 (mild): Secondary | ICD-10-CM | POA: Diagnosis not present

## 2023-04-25 DIAGNOSIS — Z5181 Encounter for therapeutic drug level monitoring: Secondary | ICD-10-CM

## 2023-04-25 DIAGNOSIS — E1122 Type 2 diabetes mellitus with diabetic chronic kidney disease: Secondary | ICD-10-CM | POA: Diagnosis not present

## 2023-04-25 LAB — PROTIME-INR
INR: 1.9 — ABNORMAL HIGH (ref 0.9–1.2)
Prothrombin Time: 20.2 s — ABNORMAL HIGH (ref 9.1–12.0)

## 2023-04-25 LAB — BASIC METABOLIC PANEL
BUN/Creatinine Ratio: 18 (ref 10–24)
BUN: 21 mg/dL (ref 8–27)
CO2: 24 mmol/L (ref 20–29)
Calcium: 9.1 mg/dL (ref 8.6–10.2)
Chloride: 104 mmol/L (ref 96–106)
Creatinine, Ser: 1.18 mg/dL (ref 0.76–1.27)
Glucose: 135 mg/dL — ABNORMAL HIGH (ref 70–99)
Potassium: 4.3 mmol/L (ref 3.5–5.2)
Sodium: 140 mmol/L (ref 134–144)
eGFR: 64 mL/min/{1.73_m2} (ref >59)

## 2023-04-25 LAB — CBC
Hematocrit: 45.5 % (ref 37.5–51.0)
Hemoglobin: 14.8 g/dL (ref 13.0–17.7)
MCH: 30.5 pg (ref 26.6–33.0)
MCHC: 32.5 g/dL (ref 31.5–35.7)
MCV: 94 fL (ref 79–97)
Platelets: 249 10*3/uL (ref 150–450)
RBC: 4.86 x10E6/uL (ref 4.14–5.80)
RDW: 12.9 % (ref 11.6–15.4)
WBC: 6.9 10*3/uL (ref 3.4–10.8)

## 2023-04-25 MED ORDER — IOHEXOL 350 MG/ML SOLN
95.0000 mL | Freq: Once | INTRAVENOUS | Status: AC | PRN
  Administered 2023-04-25: 95 mL via INTRAVENOUS

## 2023-04-25 NOTE — Patient Instructions (Signed)
Description   Take 1.5 tablets today and then START taking warfarin 1 tablet daily except for 1/2 tablet on Fridays.  Stay consistent with greens each week (1 per week).  Recheck INR in 1 week pending Ablation. Ablation 05/23/2023-Needs weekly INR checks.  Coumadin Clinic [phone]

## 2023-04-26 ENCOUNTER — Ambulatory Visit: Payer: Medicare Other | Admitting: Cardiovascular Disease

## 2023-04-30 ENCOUNTER — Telehealth: Payer: Self-pay | Admitting: Cardiology

## 2023-04-30 ENCOUNTER — Ambulatory Visit: Payer: Medicare Other | Attending: Cardiovascular Disease

## 2023-04-30 DIAGNOSIS — I4891 Unspecified atrial fibrillation: Secondary | ICD-10-CM | POA: Diagnosis not present

## 2023-04-30 DIAGNOSIS — Z5181 Encounter for therapeutic drug level monitoring: Secondary | ICD-10-CM | POA: Insufficient documentation

## 2023-04-30 DIAGNOSIS — Z7901 Long term (current) use of anticoagulants: Secondary | ICD-10-CM | POA: Insufficient documentation

## 2023-04-30 LAB — POCT INR: INR: 2.7 (ref 2.0–3.0)

## 2023-04-30 NOTE — Telephone Encounter (Signed)
Patient identification verified by 2 forms. [name], RN    Called and spoke to patient wife [name] states:  -right knee is swollen, has difficulty walking   -would like to know if patient can get a steroid knee injection prior to Cath   -cath is scheduled for 05/23/23 Informed [name] message sent for input/advisement  Joel Jones has no further questions at this time

## 2023-04-30 NOTE — Patient Instructions (Signed)
Continue taking warfarin 1 tablet daily except for 1/2 tablet on Fridays.  Stay consistent with greens each week (1 per week).  Recheck INR in 1 week pending Ablation. Ablation 05/23/2023-Needs weekly INR checks.  Coumadin Clinic [phone]

## 2023-04-30 NOTE — Telephone Encounter (Signed)
Pt's wife wants to know if he can have an injection for his knee or will it mess up his procedure. Please advise

## 2023-05-01 NOTE — Telephone Encounter (Signed)
Spoke with pt wife, Aware of dr crenshaw's recommendations.  

## 2023-05-01 NOTE — Telephone Encounter (Signed)
Unable to reach pt or leave a message  

## 2023-05-02 ENCOUNTER — Other Ambulatory Visit: Payer: Self-pay | Admitting: Pharmacist

## 2023-05-02 ENCOUNTER — Telehealth: Payer: Self-pay | Admitting: Physical Medicine and Rehabilitation

## 2023-05-02 ENCOUNTER — Ambulatory Visit (HOSPITAL_COMMUNITY)
Admission: RE | Admit: 2023-05-02 | Discharge: 2023-05-02 | Disposition: A | Discharge status: Home / Self Care | Payer: Medicare Other | Source: Ambulatory Visit | Attending: Rheumatology | Admitting: Rheumatology

## 2023-05-02 ENCOUNTER — Encounter: Payer: Self-pay | Admitting: Orthopaedic Surgery

## 2023-05-02 ENCOUNTER — Other Ambulatory Visit (INDEPENDENT_AMBULATORY_CARE_PROVIDER_SITE_OTHER): Payer: Self-pay

## 2023-05-02 ENCOUNTER — Ambulatory Visit (INDEPENDENT_AMBULATORY_CARE_PROVIDER_SITE_OTHER): Payer: Medicare Other | Admitting: Orthopaedic Surgery

## 2023-05-02 DIAGNOSIS — M0579 Rheumatoid arthritis with rheumatoid factor of multiple sites without organ or systems involvement: Secondary | ICD-10-CM

## 2023-05-02 DIAGNOSIS — M1711 Unilateral primary osteoarthritis, right knee: Secondary | ICD-10-CM

## 2023-05-02 DIAGNOSIS — Z96651 Presence of right artificial knee joint: Secondary | ICD-10-CM

## 2023-05-02 DIAGNOSIS — Z5181 Encounter for therapeutic drug level monitoring: Secondary | ICD-10-CM

## 2023-05-02 DIAGNOSIS — Z79899 Other long term (current) drug therapy: Secondary | ICD-10-CM | POA: Insufficient documentation

## 2023-05-02 DIAGNOSIS — Z111 Encounter for screening for respiratory tuberculosis: Secondary | ICD-10-CM

## 2023-05-02 MED ORDER — LIDOCAINE HCL 1 % IJ SOLN
3.0000 mL | INTRAMUSCULAR | Status: AC | PRN
  Administered 2023-05-02: 3 mL

## 2023-05-02 MED ORDER — SODIUM CHLORIDE 0.9 % IV SOLN
750.0000 mg | INTRAVENOUS | Status: DC
  Administered 2023-05-02: 750 mg via INTRAVENOUS
  Filled 2023-05-02: qty 30

## 2023-05-02 MED ORDER — METHYLPREDNISOLONE ACETATE 40 MG/ML IJ SUSP
40.0000 mg | INTRAMUSCULAR | Status: AC | PRN
  Administered 2023-05-02: 40 mg via INTRA_ARTICULAR

## 2023-05-02 MED ORDER — DIPHENHYDRAMINE HCL 25 MG PO CAPS: 25.0000 mg | ORAL_CAPSULE | ORAL | Status: DC

## 2023-05-02 MED ORDER — ACETAMINOPHEN 325 MG PO TABS
650.0000 mg | ORAL_TABLET | ORAL | Status: DC
Start: 2023-05-02 — End: 2023-07-25

## 2023-05-02 NOTE — Telephone Encounter (Signed)
Patient Emergency contact called and said for you to call her. She wanted to know if they will have a ride to go home or do they need to stay. CB#[phone]

## 2023-05-02 NOTE — Progress Notes (Signed)
Next infusion scheduled for Orencia IV (M0102) on 05/30/2023 and due for updated orders. Diagnosis: RA  Dose: 750mg  every 4 weeks (appropriate based on last recorded weight of 97.1kg)  Last Clinic Visit: 12/19/2022 Next Clinic Visit: 06/01/2023  Last infusion: 05/02/2023  Labs: CBC on 04/24/2023, BMP on 04/24/2023 TB Gold: negative on 07/19/2022   Orders placed for Orencia IV (J0129) x 3 doses along with premedication of acetaminophen and diphenhydramine to be administered 30 minutes before medication infusion.  Standing CBC with diff/platelet and CMP with GFR orders placed to be drawn every 2 months.  Next TB gold due 07/19/2023 (order placed)  Chesley Mires, PharmD, MPH, BCPS, CPP Clinical Pharmacist (Rheumatology and Pulmonology)

## 2023-05-02 NOTE — Progress Notes (Signed)
Mr. Southers is well-known to me.  He has a history of a right partial knee arthroplasty performed over 7 years ago.  He comes in today with right knee swelling and mainly lateral knee pain.  We have recommended in the past converting this to a total knee arthroplasty.  He is ambulating with a walking crutch.  He has been wearing a knee sleeve and a knee brace.  He is on Coumadin and also has an insulin pump.  He reports better blood glucose control as well.  He has a revision knee arthroplasty on the left knee.  He does report that in January coming up he has a cardiac ablation to treat his A-fib.  Examination of his right knee today does show a moderate effusion.  I was able to aspirate 40 to 50 cc of a bloody effusion which looks like more of a hemarthrosis.  His INR was 5.72 days ago.  X-rays of the right knee shows a well-seated medial compartment partial knee replacement.  The lateral compartment and patellofemoral joint have severe end-stage arthritis at this point.  On exam most of his pain is lateral at the lateral joint line.  There is no medial pain.  After I did aspirate his knee and drained the hemarthrosis of the knee I did place a steroid injection in that right knee to help him through the holidays.  He will watch his blood glucose closely.  He did feel much better and and feels like he has pressure taken off that knee which is good.  If things worsen he will let us know.  He is going to consider an knee revision in the spring of this next year.    Procedure Note  Patient: Krrish Roblyer Dinning Sr.             Date of Birth: 1946/02/15           MRN: [id number]             Visit Date: 05/02/2023  Procedures: Visit Diagnoses:  1. History of prosthetic unicompartmental arthroplasty of right knee     Large Joint Inj: R knee on 05/02/2023 2:14 PM Indications: diagnostic evaluation and pain Details: 22 G 1.5 in needle, superolateral approach  Arthrogram: No  Medications: 3 mL lidocaine 1 %;  40 mg methylPREDNISolone acetate 40 MG/ML Outcome: tolerated well, no immediate complications Procedure, treatment alternatives, risks and benefits explained, specific risks discussed. Consent was given by the patient. Immediately prior to procedure a time out was called to verify the correct patient, procedure, equipment, support staff and site/side marked as required. Patient was prepped and draped in the usual sterile fashion.

## 2023-05-07 ENCOUNTER — Ambulatory Visit: Payer: Medicare Other | Attending: Cardiology

## 2023-05-07 DIAGNOSIS — I4891 Unspecified atrial fibrillation: Secondary | ICD-10-CM | POA: Insufficient documentation

## 2023-05-07 DIAGNOSIS — Z7901 Long term (current) use of anticoagulants: Secondary | ICD-10-CM | POA: Diagnosis not present

## 2023-05-07 DIAGNOSIS — Z5181 Encounter for therapeutic drug level monitoring: Secondary | ICD-10-CM | POA: Diagnosis not present

## 2023-05-07 LAB — POCT INR: INR: 2.9 (ref 2.0–3.0)

## 2023-05-07 LAB — HEMOGLOBIN A1C: Hemoglobin A1C: 8.3

## 2023-05-07 NOTE — Patient Instructions (Signed)
 Continue taking warfarin 1 tablet daily except for 1/2 tablet on Fridays.  Stay consistent with greens each week (1 per week).  Recheck INR in 1 week pending Ablation. Ablation 05/23/2023-Needs weekly INR checks.  Coumadin Clinic [phone]

## 2023-05-10 ENCOUNTER — Ambulatory Visit (HOSPITAL_COMMUNITY)
Admission: RE | Admit: 2023-05-10 | Discharge: 2023-05-10 | Disposition: A | Discharge status: Home / Self Care | Payer: Medicare Other | Source: Ambulatory Visit | Attending: Orthopaedic Surgery | Admitting: Orthopaedic Surgery

## 2023-05-10 ENCOUNTER — Other Ambulatory Visit: Payer: Self-pay

## 2023-05-10 DIAGNOSIS — M25561 Pain in right knee: Secondary | ICD-10-CM | POA: Diagnosis not present

## 2023-05-10 DIAGNOSIS — M25461 Effusion, right knee: Secondary | ICD-10-CM | POA: Diagnosis not present

## 2023-05-10 DIAGNOSIS — M65961 Unspecified synovitis and tenosynovitis, right lower leg: Secondary | ICD-10-CM | POA: Diagnosis not present

## 2023-05-10 DIAGNOSIS — M7121 Synovial cyst of popliteal space [Baker], right knee: Secondary | ICD-10-CM | POA: Diagnosis not present

## 2023-05-10 DIAGNOSIS — S83271A Complex tear of lateral meniscus, current injury, right knee, initial encounter: Secondary | ICD-10-CM | POA: Diagnosis not present

## 2023-05-11 ENCOUNTER — Telehealth: Payer: Self-pay | Admitting: Pharmacist

## 2023-05-11 NOTE — Telephone Encounter (Signed)
Called patient to confirm if any anticipated insurance changes in 2025. Patient states he is switching to [facility] Ad[employer]age plan in 2025 and losing supplemental plan. He is aware that without supplemental plan, there [place] be coinsurance. He states his broker set everything up   Medication: IV Orencia  IV Orencia will require pre-certification through [facility]    Chesley Mires, PharmD, MPH, BCPS, CPP Clinical Pharmacist (Rheumatology and Pulmonology)

## 2023-05-14 ENCOUNTER — Ambulatory Visit: Payer: Medicare Other | Attending: Internal Medicine

## 2023-05-14 DIAGNOSIS — Z7901 Long term (current) use of anticoagulants: Secondary | ICD-10-CM | POA: Insufficient documentation

## 2023-05-14 DIAGNOSIS — Z5181 Encounter for therapeutic drug level monitoring: Secondary | ICD-10-CM | POA: Insufficient documentation

## 2023-05-14 DIAGNOSIS — I4891 Unspecified atrial fibrillation: Secondary | ICD-10-CM | POA: Diagnosis not present

## 2023-05-14 LAB — POCT INR: INR: 2.9 (ref 2.0–3.0)

## 2023-05-14 NOTE — Patient Instructions (Signed)
Description   Continue taking warfarin 1 tablet daily except for 1/2 tablet on Fridays.  Stay consistent with greens each week (1 per week).  Recheck INR in 1 week pending Ablation on 05/23/23. Ablation 05/23/2023-Needs weekly INR checks.  Coumadin Clinic [phone]

## 2023-05-17 ENCOUNTER — Encounter: Payer: Self-pay | Admitting: Physician Assistant

## 2023-05-17 ENCOUNTER — Other Ambulatory Visit (HOSPITAL_COMMUNITY): Payer: Self-pay

## 2023-05-17 ENCOUNTER — Ambulatory Visit (INDEPENDENT_AMBULATORY_CARE_PROVIDER_SITE_OTHER): Payer: Self-pay | Admitting: Physician Assistant

## 2023-05-17 DIAGNOSIS — M1711 Unilateral primary osteoarthritis, right knee: Secondary | ICD-10-CM

## 2023-05-17 NOTE — Progress Notes (Signed)
   Procedure Note  Patient: Joel Clairmont Brew Sr.             Date of Birth: Jul 30, 1945           MRN: [id number]             Visit Date: 05/17/2023 HPI: [name] returns today status post MRI right knee entrance.  MRI images show severe lateral and patellofemoral arthritis.  Status post medial compartment arthroplasty without any evidence of hardware failure.  Lateral meniscus with extensive degenerative tearing and maceration.  No acute fractures. Patient's undergoing procedure for his A-fib in the near future.  He is currently on Coumadin .  He states he is having swelling knee pain and needs walking with the use of a cane.  Physical exam: General: Well-developed well-nourished male no acute distress.  Ambulates with assistive device and antalgic gait. Right knee good range of motion.  No abnormal warmth erythema.  Positive effusion.  Procedures: Visit Diagnoses:  1. Primary osteoarthritis of right knee    Right knee is prepped with Betadine then 3 cc of lidocaine  was used and left sides knee.  In total of 25 cc of synovial fluid aspirated.  Patient tolerates well.  Plan: He will follow-up with us  as needed.  Ace bandage was applied to the knee he will remove this before retiring to bed this evening.  Questions were encouraged and answered at length.

## 2023-05-17 NOTE — Telephone Encounter (Signed)
 Submitted IV Orencia pre-certification request on [facility] provider portal  Auth request #: [id number]  Authorization is pending.

## 2023-05-18 NOTE — Telephone Encounter (Signed)
 Per [facility] provider portal, IV Orencia  [phone]) is approved from 05/17/2023 through 05/15/2024. Approval letter sent to media tab  Number of units per visit: 900 Facility: [facility] Provider: Dr. Maya Nash  Authorization #: [id number]  Sherry Pennant, PharmD, MPH, BCPS, CPP Clinical Pharmacist (Rheumatology and Pulmonology)

## 2023-05-18 NOTE — Progress Notes (Signed)
Office Visit Note  Patient: Joel Alonzo Whitecotton Sr.             Date of Birth: [birth year]           MRN: [id number]             PCP: [name], MD Referring: [name], MD Visit Date: 06/01/2023 Occupation: @GUAROCC @  Subjective:  Medication management  History of Present Illness: Joel Tardio Ocain Sr. is a 78 y.o. male with seropositive rheumatoid arthritis.  He returns today after his last visit on December 19, 2022.  He is on IV Orencia 750 mg every 20 days.  Last infusion was on May 30, 2023.  He continues to have some discomfort in his right knee joint.  Patient states that he will require right total knee replacement in the future.  He had ablation for atrial fibrillation on May 23, 2023.    Activities of Daily Living:  Patient reports morning stiffness for 0 minute.   Patient Reports nocturnal pain.  Difficulty dressing/grooming: Denies Difficulty climbing stairs: Reports Difficulty getting out of chair: Reports Difficulty using hands for taps, buttons, cutlery, and/or writing: Denies  Review of Systems  Constitutional:  Positive for fatigue.  HENT:  Positive for mouth dryness. Negative for mouth sores.   Eyes:  Negative for dryness.  Respiratory:  Negative for shortness of breath.   Cardiovascular:  Negative for chest pain and palpitations.  Gastrointestinal:  Negative for blood in stool, constipation and diarrhea.  Endocrine: Negative for increased urination.  Genitourinary:  Negative for involuntary urination.  Musculoskeletal:  Positive for joint pain, gait problem, joint pain, joint swelling and muscle tenderness. Negative for myalgias, muscle weakness, morning stiffness and myalgias.  Skin:  Negative for color change, rash, hair loss and sensitivity to sunlight.  Allergic/Immunologic: Negative for susceptible to infections.  Neurological:  Negative for dizziness and headaches.  Hematological:  Negative for swollen glands.  Psychiatric/Behavioral:  Positive for  sleep disturbance. Negative for depressed mood. The patient is nervous/anxious.     PMFS History:  Patient Active Problem List   Diagnosis Date Noted   Hyperlipidemia associated with type 2 diabetes mellitus (HCC) 02/05/2023   Long term (current) use of anticoagulants 12/25/2022   Hypercoagulable state due to persistent atrial fibrillation (HCC) 12/19/2022   Atrial fibrillation, unspecified type (HCC) 12/12/2022   Encounter for therapeutic drug monitoring 12/12/2022   Acute mucoid otitis media of left ear 06/27/2022   Gastrocnemius muscle tear, left, subsequent encounter 06/27/2022   Allergic sinusitis 02/24/2022   Asthmatic bronchitis , chronic (HCC) 01/24/2022   Primary insomnia 10/06/2021   Gastroesophageal reflux disease 05/18/2021   Type 2 diabetes mellitus with other specified complication (HCC) 05/18/2021   Long term (current) use of insulin (HCC) 05/18/2021   Obesity 05/18/2021   Elevated transaminase level 05/18/2021   Tear of left rotator cuff 09/24/2017   DDD (degenerative disc disease), cervical 10/31/2016   Primary osteoarthritis of both hands 10/31/2016   Primary osteoarthritis of both feet 10/31/2016   High risk medication use 07/06/2016   Rheumatoid arthritis involving multiple sites with positive rheumatoid factor (HCC) 06/02/2016   Contracture, elbow, left 06/02/2016   Osteoarthritis of right knee 08/27/2015   S/P TKR (total knee replacement), left 08/27/2015   Essential hypertension 05/11/2015   CAD (coronary artery disease) 04/02/2012   Sinus bradycardia    Hyperlipidemia     Past Medical History:  Diagnosis Date   A-fib (HCC)    AKI (acute kidney  injury) (HCC) 05/18/2021   Anemia    Arthritis    "all over"   Bradycardia    Chronic bronchitis (HCC)    "get it just about q yr" (03/17/2014)   Daily headache    "here lately" (03/17/2014); relates to sinuses   GERD (gastroesophageal reflux disease)    Hard of hearing    hearing aides bilat   Hiatal  hernia    History of blood transfusion 2008   "related to OR"   History of kidney stones    Hyperlipidemia    Pneumonia 1972 X 1   Rheumatoid arthritis (HCC)    Type II diabetes mellitus (HCC)    Wears glasses     Family History  Problem Relation Age of Onset   Cancer Father        prostate   Past Surgical History:  Procedure Laterality Date   ATRIAL FIBRILLATION ABLATION N/A 05/23/2023   Procedure: ATRIAL FIBRILLATION ABLATION;  Surgeon: Maurice Small, MD;  Location: [facility];  Service: Cardiovascular;  Laterality: N/A;   broken finger      surgical repaired left hand 2nd finger    CARDIOVERSION N/A 11/08/2022   Procedure: CARDIOVERSION;  Surgeon: Maisie Fus, MD;  Location: [facility];  Service: Cardiovascular;  Laterality: N/A;   cyst removed      per left knee/posteriorly   CYSTECTOMY Left 2009   "behind knee"   INGUINAL HERNIA REPAIR Right ?2010   JOINT REPLACEMENT     KNEE ARTHROSCOPY Bilateral 1980's   PARTIAL KNEE ARTHROPLASTY Right 08/27/2015   Procedure: RIGHT UNICOMPARTMENTAL KNEE ARTHROPLASTY;  Surgeon: Kathryne Hitch, MD;  Location: [facility];  Service: Orthopedics;  Laterality: Right;   REVISION TOTAL KNEE ARTHROPLASTY Left 2008   SHOULDER SURGERY Left 2019   TOTAL KNEE ARTHROPLASTY Left 2003   Social History   Social History Narrative   Patient lives at home with family.   Caffeine Use: occasionally   Immunization History  Administered Date(s) Administered   Pneumococcal Conjugate-13 01/13/2014   Pneumococcal Polysaccharide-23 05/15/2006, 09/16/2012, 03/29/2018     Objective: Vital Signs: BP 118/66 (BP Location: Right Arm, Patient Position: Sitting, Cuff Size: Large)   Pulse (!) 47   Resp 14   Ht 5\' 6"  (1.676 m)   Wt 206 lb (93.4 kg)   BMI 33.25 kg/m    Physical Exam Vitals and nursing note reviewed.  Constitutional:      Appearance: He is well-developed.  HENT:     Head: Normocephalic and atraumatic.  Eyes:      Conjunctiva/sclera: Conjunctivae normal.     Pupils: Pupils are equal, round, and reactive to light.  Cardiovascular:     Rate and Rhythm: Normal rate and regular rhythm.     Heart sounds: Normal heart sounds.  Pulmonary:     Effort: Pulmonary effort is normal.     Breath sounds: Normal breath sounds.  Abdominal:     General: Bowel sounds are normal.     Palpations: Abdomen is soft.  Musculoskeletal:     Cervical back: Normal range of motion and neck supple.  Skin:    General: Skin is warm and dry.     Capillary Refill: Capillary refill takes less than 2 seconds.  Neurological:     Mental Status: He is alert and oriented to person, place, and time.  Psychiatric:        Behavior: Behavior normal.      Musculoskeletal Exam: He had limited  lateral rotation of the cervical spine.  There was no tenderness over thoracic or lumbar spine.  Shoulder joints were in good range of motion with some discomfort in his left shoulder.  Elbows, wrists, MCPs PIPs and DIPs with good range of motion.  He had bilateral PIP and DIP thickening.  No synovitis was noted.  He had limited flexion and extension of the right knee joint which is partially replaced.  Left knee joint was in good range of motion which is replaced.  There was no tenderness over ankles or MTPs.  CDAI Exam: CDAI Score: -- Patient Global: --; Provider Global: -- Swollen: --; Tender: -- Joint Exam 06/01/2023   No joint exam has been documented for this visit   There is currently no information documented on the homunculus. Go to the Rheumatology activity and complete the homunculus joint exam.  Investigation: No additional findings.  Imaging: CT ANGIO GI BLEED Result Date: 05/28/2023 CLINICAL DATA:  Abdominal plain, melena, recent AFib ablation EXAM: CTA ABDOMEN AND PELVIS WITHOUT AND WITH CONTRAST TECHNIQUE: Multidetector CT imaging of the abdomen and pelvis was performed using the standard protocol during bolus administration of  intravenous contrast. Multiplanar reconstructed images and MIPs were obtained and reviewed to evaluate the vascular anatomy. RADIATION DOSE REDUCTION: This exam was performed according to the departmental dose-optimization program which includes automated exposure control, adjustment of the mA and/or kV according to patient size and/or use of iterative reconstruction technique. CONTRAST:  OMNIPAQUE IOHEXOL 350 MG/ML SOLN COMPARISON:  CT abdomen/pelvis dated 08/21/2012 FINDINGS: Lower chest: Lung bases are clear. Hepatobiliary: Liver is within normal limits. Gallbladder is unremarkable. No intrahepatic or extrahepatic dilatation. Pancreas: Within normal limits. Spleen: Within normal limits. Adrenals/Urinary Tract: Adrenal glands are within normal limits. Bilateral simple renal cysts, measuring up to 4.8 cm in the lateral left upper kidney (series 2/image 27), benign (Bosniak I). No follow-up is recommended. No hydronephrosis. Bladder is within normal limits. Stomach/Bowel: Stomach is within normal limits. No evidence of bowel obstruction. Normal appendix (series 12/image 85). Mild sigmoid diverticulosis, without evidence of diverticulitis. Following contrast administration, there is no intraluminal spillage of contrast to suggest active GI bleeding on CT. Vascular/Lymphatic: No evidence of abdominal aortic aneurysm. Atherosclerotic calcifications of the abdominal aorta and branch vessels, although vessels remain patent. No suspicious abdominopelvic lymphadenopathy. Reproductive: Prostate is unremarkable. Other: No abdominopelvic ascites. Musculoskeletal: Degenerative changes of the visualized thoracolumbar spine. IMPRESSION: No evidence of active GI bleeding on CT. Mild sigmoid diverticulosis, without evidence of diverticulitis. Electronically Signed   By: Charline Bills M.D.   On: 05/28/2023 21:02   CT Head Wo Contrast Result Date: 05/28/2023 CLINICAL DATA:  Headache EXAM: CT HEAD WITHOUT CONTRAST  TECHNIQUE: Contiguous axial images were obtained from the base of the skull through the vertex without intravenous contrast. RADIATION DOSE REDUCTION: This exam was performed according to the departmental dose-optimization program which includes automated exposure control, adjustment of the mA and/or kV according to patient size and/or use of iterative reconstruction technique. COMPARISON:  08/20/2021 FINDINGS: Brain: No mass,hemorrhage or extra-axial collection. Normal appearance of the parenchyma and CSF spaces. Vascular: Atherosclerotic calcification of the internal carotid arteries at the skull base. No abnormal hyperdensity of the major intracranial arteries or dural venous sinuses. Skull: Negative Sinuses/Orbits: Partial opacification of the sphenoid sinus. Other: None. IMPRESSION: 1. No acute intracranial abnormality. 2. Partial opacification of the sphenoid sinus. Electronically Signed   By: Deatra Robinson M.D.   On: 05/28/2023 20:50   DG Chest Portable 1 View  Result Date: 05/28/2023 CLINICAL DATA:  Chest pain. EXAM: PORTABLE CHEST 1 VIEW COMPARISON:  Chest radiograph dated March 10, 2022. FINDINGS: Patient is rotated to the right. Overlapping telemetry wires. The heart size and mediastinal contours are within normal limits. No focal consolidation, sizeable pleural effusion, or pneumothorax. No acute osseous abnormality. IMPRESSION: No acute cardiopulmonary findings. Electronically Signed   By: Hart Robinsons M.D.   On: 05/28/2023 19:45   EP STUDY Result Date: 05/23/2023 SURGEON:  Maurice Small, MD PREPROCEDURE DIAGNOSES: 1. Persistent atrial fibrillation. POSTPROCEDURE DIAGNOSES: 1. Persistent  atrial fibrillation. PROCEDURES: 1. Eelectrophysiologic study. 2. Coronary sinus pacing and recording. 3. Ablation of the pulmonary veins with pulsed field ablation 4. Ablation of the posterior wall of the left atrium with pulsed field ablation 5. Intracardiac echocardiography. 6. Transseptal puncture of  an intact septum. INTRODUCTION:  Joel Mehrotra Cisar Sr. is a 78 y.o. male with a history of persistent atrial fibrillation who now presents for EP study and radiofrequency ablation . DESCRIPTION OF PROCEDURE:  Informed written consent was obtained, and the patient was brought to the electrophysiology lab in a fasting state.  The patient was adequately sedated with intravenous medications as outlined in the anesthesia report.  The patient's left and right groins were prepped and draped in the usual sterile fashion by the EP lab staff.  Using a percutaneous Seldinger technique, 1 9-French hemostasis sheaths was placed in the right femoral vein, and one 9-French and one 8-French  hemostasis sheath were placed into the left common femoral vein. Direct ultrasound guidance was used for right and left femoral veins with normal vessel patency. Using ultrasound guidance, the Brockenbrough needle and wire were visualized entering the vessel.  Initial Measurements: The patient presented to the electrophysiology lab in AF. his  QRS duration was 75 msec and a QT interval of 354 msec.  Intracardiac Echocardiography: An 8-French Biosense Webster AcuNav intracardiac echocardiography catheter was introduced through the left common femoral vein and advanced into the right atrium. Intracardiac echocardiography was performed of the left atrium, and a three-dimensional anatomical rendering of the left atrium was performed using CARTO sound technology. The patient was noted to have an enlarged left atrium. The interatrial septum appeared normal. All 4 pulmonary veins were visualized and noted to have separate ostia.  The pulmonary veins were normal in size.  The left atrial appendage was visualized and did not reveal thrombus.   There was no evidence of pulmonary vein stenosis. Transseptal Puncture: Intravenous heparin was administered to achieve a goal ACT of 300-350 seconds. The right common femoral vein sheath was exchanged for Faradrive  connect sheath and transseptal access was achieved in a standard fashion using a Bayless pigtail wire with intracardiac echocardiography.  Catheter Placement:  A 7-French Biosense Webster Decapolar coronary sinus catheter was introduced through the right common femoral vein and advanced into the coronary sinus for recording and pacing from this location. 3D Mapping and Ablation: A  Biosense Webster Octarray catheter was advanced into the left atrium through the Faradrive sheath. A 3 D electroanatomical map was obtained. This revealed normal .  The mapping catheter was then removed and the Farapulse catheter advanced to the left atrium over its wire. Sequential electrical ablation of all four pulmonary veins was then performed using pulsed field energy. The posterior wall of the left atrium was also ablated.  A total of 48 pulses were delivered.  Cardioversion: The patient was then cardioverted to sinus rhythm with a single synchronized 200-J biphasic shock with  cardioversion electrodes in the anterior-posterior thoracic configuration. He remained in sinus rhythm thereafter. Measurements Following Ablation: In sinus rhythm with RR interval was 110 msec, with PR 162 msec, QRS 82 msec, and QT 402 msec.  AH interval was and HV interval . Rapid atrial pacing was performed, which revealed an AV Wenckebach cycle length of 450 msec.  The procedure was therefore considered completed.  All catheters were removed, and the sheaths were aspirated and flushed. The right femoral venous access was closed with a two perclose devices and the left femoral venous accesses with Mynx devices.  EBL<47ml.  Intracardiac echocardiogram revealed no pericardial effusion.  There were no early apparent complications. CONCLUSIONS: 1. AF upon presentation.  2. Successful ablation of all four pulmonary veins with pulsed field energy. 3. Successful ablation of the posterior wall of the left atrium with pulsed field energy 4. Successful  cardioversion to sinus rhythm 5. No inducible arrhythmias following ablation 6. No early apparent complications. Joel Gaudy Mealor,MD 2:03 PM 05/23/2023   MR Knee Right w/o contrast Result Date: 05/10/2023 CLINICAL DATA:  Chronic right knee pain EXAM: MRI OF THE RIGHT KNEE WITHOUT CONTRAST TECHNIQUE: Multiplanar, multisequence MR imaging of the knee was performed utilizing a metal artifact reduction protocol. No intravenous contrast was administered. COMPARISON:  X-ray 05/02/2023, MRI 11/02/2015 FINDINGS: MENISCI Medial meniscus:  Prior medial compartment arthroplasty. Lateral meniscus: Extensive degenerative tearing/maceration of the lateral meniscus. LIGAMENTS Cruciates: Mucoid degeneration of the cruciate ligaments without evidence of tear. Collaterals: Intact MCL. Lateral collateral ligament complex intact. CARTILAGE Patellofemoral: Moderate patellofemoral compartment osteoarthritis, progressed from the prior MRI. Medial:  Prior medial compartment arthroplasty. Lateral: Severe lateral compartment osteoarthritis with extensive full-thickness cartilage loss, progressed. MISCELLANEOUS Joint: Small knee joint effusion with synovitis. Fat pads within normal limits. Popliteal Fossa:  Small Baker's cyst. Intact popliteus tendon. Extensor Mechanism:  Intact quadriceps and patellar tendons. Bones: No acute fracture. No dislocation. Prior medial compartment arthroplasty with associated metallic susceptibility artifact. Lateral and patellofemoral compartment joint space narrowing with marginal osteophyte formation. No appreciable sites of bone marrow edema. No bone marrow edema. No marrow replacing bone lesion. Other: No significant periarticular soft tissue findings. IMPRESSION: 1. Prior medial compartment arthroplasty. 2. Severe lateral and moderate patellofemoral compartment osteoarthritis, progressed from the prior MRI. 3. Extensive degenerative tearing/maceration of the lateral meniscus. 4. Small knee joint effusion  with synovitis. 5. Small Baker's cyst. Electronically Signed   By: Duanne Guess D.O.   On: 05/10/2023 13:26   XR Knee 1-2 Views Right Result Date: 05/02/2023 2 views of the right knee show well-seated medial compartment partial knee arthroplasty.  There is severe end-stage arthritis of the lateral compartment and patellofemoral joint with osteophytes and bone-on-bone wear of those 2 compartments.   Recent Labs: Lab Results  Component Value Date   WBC 5.8 05/30/2023   HGB 12.9 (L) 05/30/2023   PLT 226 05/30/2023   NA 138 05/30/2023   K 3.7 05/30/2023   CL 103 05/30/2023   CO2 28 05/30/2023   GLUCOSE 122 (H) 05/30/2023   BUN 17 05/30/2023   CREATININE 1.02 05/30/2023   BILITOT 0.7 05/30/2023   ALKPHOS 104 05/30/2023   AST 31 05/30/2023   ALT 41 05/30/2023   PROT 6.4 (L) 05/30/2023   ALBUMIN 3.7 05/30/2023   CALCIUM 9.0 05/30/2023   GFRAA >60 01/26/2020   QFTBGOLDPLUS Negative 07/19/2022    Speciality Comments: Orencia 750mg  every 4 weeks TB gold negative on Jan 2019  Procedures:  No procedures performed Allergies: Codeine,  Lipitor [atorvastatin], Invokana [canagliflozin], Losartan, Roxicodone [oxycodone], and Vicodin [hydrocodone-acetaminophen]   Assessment / Plan:     Visit Diagnoses: Rheumatoid arthritis involving multiple sites with positive rheumatoid factor (HCC)-patient states he has been doing well on Orencia infusions.  He had no synovitis on the examination.  He continues to have some stiffness in his hands due to underlying osteoarthritis.  He has intermittent discomfort in his left shoulder.  He has ongoing pain and discomfort in his right knee joint which requires total replacement.  High risk medication use - IV Orencia 750 mg infusions every 28 days.  Last Orencia infusion was on May 30, 2023.  May 30, 2023 CBC showed hemoglobin of 12.9 and CMP was normal.  TB Gold was negative on July 19, 2022.  Will get repeat TB Gold and lipid panel with his next  infusion.  Information on immunization was placed in the AVS.  He was advised to hold Orencia if he develops an infection resume after the infection resolves.  Long-term use of anticoagulation-patient is on warfarin.  Primary osteoarthritis of both hands-bilateral PIP and DIP thickening was noted.  No synovitis was noted.  Status post right partial knee replacement - By Dr. Magnus Ivan.  He continues to have chronic pain.  He has limited extension and flexion of his right knee joint.  He will require total knee replacement in the future.  S/P TKR (total knee replacement), left - X 2 by Dr. Priscille Kluver.  Doing well.  Primary osteoarthritis of both feet-he denies any discomfort in his feet today.  DDD (degenerative disc disease), cervical-he had lateral rotation without discomfort.  Degeneration of intervertebral disc of lumbar region without discogenic back pain or lower extremity pain - Followed by Dr. Shon Baton.  Other medical problems are listed as follows:  Atrial fibrillation-patient underwent ablation recently.  He is on warfarin.  History of diabetes mellitus  History of hypertension-blood pressure was normal at 119/66.  History of hyperlipidemia  History of coronary artery disease  Chronic insomnia  Other fatigue  Orders: Orders Placed This Encounter  Procedures   Lipid panel   QuantiFERON-TB Gold Plus   No orders of the defined types were placed in this encounter.    Follow-Up Instructions: Return for Rheumatoid arthritis.   Pollyann Savoy, MD  Note - This record has been created using Animal nutritionist.  Chart creation errors have been sought, but [employer] not always  have been located. Such creation errors do not reflect on  the standard of medical care.

## 2023-05-22 NOTE — Pre-Procedure Instructions (Signed)
 Instructed patient on the following items: Arrival time 1100 Nothing to eat or drink after midnight No meds AM of procedure Responsible person to drive you home and stay with you for 24 hrs  Have you missed any doses of anti-coagulant-Coumadin - takes once a day, hasn't missed any doses.

## 2023-05-23 ENCOUNTER — Ambulatory Visit (HOSPITAL_BASED_OUTPATIENT_CLINIC_OR_DEPARTMENT_OTHER): Payer: Medicare Other

## 2023-05-23 ENCOUNTER — Ambulatory Visit (HOSPITAL_COMMUNITY)
Admission: RE | Admit: 2023-05-23 | Discharge: 2023-05-23 | Disposition: A | Discharge status: Home / Self Care | Payer: Medicare Other | Attending: Cardiovascular Disease | Admitting: Cardiovascular Disease

## 2023-05-23 ENCOUNTER — Other Ambulatory Visit: Payer: Self-pay

## 2023-05-23 ENCOUNTER — Encounter (HOSPITAL_COMMUNITY)
Admission: RE | Disposition: A | Discharge status: Home / Self Care | Payer: Medicare Other | Source: Home / Self Care | Attending: Cardiovascular Disease

## 2023-05-23 ENCOUNTER — Ambulatory Visit: Payer: Medicare Other

## 2023-05-23 ENCOUNTER — Ambulatory Visit (HOSPITAL_COMMUNITY): Payer: Medicare Other

## 2023-05-23 DIAGNOSIS — D6869 Other thrombophilia: Secondary | ICD-10-CM | POA: Insufficient documentation

## 2023-05-23 DIAGNOSIS — J45909 Unspecified asthma, uncomplicated: Secondary | ICD-10-CM | POA: Insufficient documentation

## 2023-05-23 DIAGNOSIS — I2510 Atherosclerotic heart disease of native coronary artery without angina pectoris: Secondary | ICD-10-CM

## 2023-05-23 DIAGNOSIS — E119 Type 2 diabetes mellitus without complications: Secondary | ICD-10-CM | POA: Diagnosis not present

## 2023-05-23 DIAGNOSIS — Z9641 Presence of insulin pump (external) (internal): Secondary | ICD-10-CM | POA: Insufficient documentation

## 2023-05-23 DIAGNOSIS — I4819 Other persistent atrial fibrillation: Secondary | ICD-10-CM | POA: Diagnosis present

## 2023-05-23 DIAGNOSIS — I4891 Unspecified atrial fibrillation: Secondary | ICD-10-CM | POA: Diagnosis not present

## 2023-05-23 DIAGNOSIS — Z794 Long term (current) use of insulin: Secondary | ICD-10-CM | POA: Diagnosis not present

## 2023-05-23 DIAGNOSIS — Z87891 Personal history of nicotine dependence: Secondary | ICD-10-CM | POA: Diagnosis not present

## 2023-05-23 DIAGNOSIS — R001 Bradycardia, unspecified: Secondary | ICD-10-CM | POA: Diagnosis not present

## 2023-05-23 DIAGNOSIS — Z7901 Long term (current) use of anticoagulants: Secondary | ICD-10-CM | POA: Insufficient documentation

## 2023-05-23 DIAGNOSIS — I10 Essential (primary) hypertension: Secondary | ICD-10-CM | POA: Diagnosis not present

## 2023-05-23 HISTORY — PX: ATRIAL FIBRILLATION ABLATION: EP1191

## 2023-05-23 LAB — POCT ACTIVATED CLOTTING TIME
Activated Clotting Time: 354 s
Activated Clotting Time: 308 s

## 2023-05-23 LAB — PROTIME-INR
INR: 3 — ABNORMAL HIGH (ref 0.8–1.2)
Prothrombin Time: 31.3 s — ABNORMAL HIGH (ref 11.4–15.2)

## 2023-05-23 LAB — GLUCOSE, CAPILLARY
Glucose-Capillary: 156 mg/dL — ABNORMAL HIGH (ref 70–99)
Glucose-Capillary: 149 mg/dL — ABNORMAL HIGH (ref 70–99)

## 2023-05-23 SURGERY — ATRIAL FIBRILLATION ABLATION
Anesthesia: General

## 2023-05-23 MED ORDER — FENTANYL CITRATE (PF) 100 MCG/2ML IJ SOLN
INTRAMUSCULAR | Status: AC
  Filled 2023-05-23: qty 2

## 2023-05-23 MED ORDER — SUGAMMADEX SODIUM 200 MG/2ML IV SOLN
INTRAVENOUS | Status: DC | PRN
  Administered 2023-05-23: 400 mg via INTRAVENOUS

## 2023-05-23 MED ORDER — ATROPINE SULFATE 1 MG/10ML IJ SOSY
PREFILLED_SYRINGE | INTRAMUSCULAR | Status: AC
  Filled 2023-05-23: qty 10

## 2023-05-23 MED ORDER — LIDOCAINE 2% (20 MG/ML) 5 ML SYRINGE
INTRAMUSCULAR | Status: DC | PRN
  Administered 2023-05-23: 60 mg via INTRAVENOUS

## 2023-05-23 MED ORDER — SODIUM CHLORIDE 0.9 % IV SOLN: 250.0000 mL | INTRAVENOUS | Status: DC | PRN

## 2023-05-23 MED ORDER — PROPOFOL 500 MG/50ML IV EMUL
INTRAVENOUS | Status: DC | PRN
  Administered 2023-05-23: 100 ug/kg/min via INTRAVENOUS

## 2023-05-23 MED ORDER — ESMOLOL HCL 100 MG/10ML IV SOLN
INTRAVENOUS | Status: DC | PRN
  Administered 2023-05-23: 20 mg via INTRAVENOUS

## 2023-05-23 MED ORDER — PHENYLEPHRINE HCL-NACL 20-0.9 MG/250ML-% IV SOLN
INTRAVENOUS | Status: DC | PRN
  Administered 2023-05-23: 15 ug/min via INTRAVENOUS

## 2023-05-23 MED ORDER — SODIUM CHLORIDE 0.9% FLUSH: 3.0000 mL | INTRAVENOUS | Status: DC | PRN

## 2023-05-23 MED ORDER — ROCURONIUM BROMIDE 10 MG/ML (PF) SYRINGE
PREFILLED_SYRINGE | INTRAVENOUS | Status: DC | PRN
  Administered 2023-05-23: 20 mg via INTRAVENOUS
  Administered 2023-05-23: 60 mg via INTRAVENOUS

## 2023-05-23 MED ORDER — ONDANSETRON HCL 4 MG/2ML IJ SOLN
INTRAMUSCULAR | Status: DC | PRN
  Administered 2023-05-23: 4 mg via INTRAVENOUS

## 2023-05-23 MED ORDER — ACETAMINOPHEN 325 MG PO TABS
650.0000 mg | ORAL_TABLET | ORAL | Status: DC | PRN
  Administered 2023-05-23: 650 mg via ORAL

## 2023-05-23 MED ORDER — HEPARIN SODIUM (PORCINE) 1000 UNIT/ML IJ SOLN
INTRAMUSCULAR | Status: DC | PRN
  Administered 2023-05-23: 10000 [IU] via INTRAVENOUS

## 2023-05-23 MED ORDER — PROTAMINE SULFATE 10 MG/ML IV SOLN
INTRAVENOUS | Status: DC | PRN
  Administered 2023-05-23: 40 mg via INTRAVENOUS

## 2023-05-23 MED ORDER — SODIUM CHLORIDE 0.9 % IV SOLN: INTRAVENOUS | Status: DC

## 2023-05-23 MED ORDER — PROPOFOL 10 MG/ML IV BOLUS
INTRAVENOUS | Status: DC | PRN
  Administered 2023-05-23: 20 mg via INTRAVENOUS
  Administered 2023-05-23: 100 mg via INTRAVENOUS

## 2023-05-23 MED ORDER — ATROPINE SULFATE 1 MG/ML IV SOLN
INTRAVENOUS | Status: DC | PRN
  Administered 2023-05-23: 1 mg via INTRAVENOUS

## 2023-05-23 MED ORDER — ACETAMINOPHEN 325 MG PO TABS
ORAL_TABLET | ORAL | Status: AC
  Filled 2023-05-23: qty 2

## 2023-05-23 MED ORDER — DEXAMETHASONE SODIUM PHOSPHATE 10 MG/ML IJ SOLN
INTRAMUSCULAR | Status: DC | PRN
  Administered 2023-05-23: 4 mg via INTRAVENOUS

## 2023-05-23 MED ORDER — FENTANYL CITRATE (PF) 250 MCG/5ML IJ SOLN
INTRAMUSCULAR | Status: DC | PRN
  Administered 2023-05-23 (×2): 50 ug via INTRAVENOUS

## 2023-05-23 MED ORDER — ONDANSETRON HCL 4 MG/2ML IJ SOLN: 4.0000 mg | Freq: Four times a day (QID) | INTRAMUSCULAR | Status: DC | PRN

## 2023-05-23 MED ORDER — NALOXONE HCL 0.4 MG/ML IJ SOLN
INTRAMUSCULAR | Status: DC | PRN
  Administered 2023-05-23: 160 ug via INTRAVENOUS
  Administered 2023-05-23: 80 ug via INTRAVENOUS

## 2023-05-23 MED ORDER — PHENYLEPHRINE 80 MCG/ML (10ML) SYRINGE FOR IV PUSH (FOR BLOOD PRESSURE SUPPORT)
PREFILLED_SYRINGE | INTRAVENOUS | Status: DC | PRN
  Administered 2023-05-23 (×3): 80 ug via INTRAVENOUS
  Administered 2023-05-23: 160 ug via INTRAVENOUS
  Administered 2023-05-23: 80 ug via INTRAVENOUS
  Administered 2023-05-23: 160 ug via INTRAVENOUS

## 2023-05-23 SURGICAL SUPPLY — 22 items
BAG SNAP BAND KOVER 36X36 (MISCELLANEOUS) IMPLANT
CABLE PFA RX CATH CONN (CABLE) IMPLANT
CATH 8FR REPROCESSED SOUNDSTAR (CATHETERS) ×1
CATH 8FR SOUNDSTAR REPROCESSED (CATHETERS) IMPLANT
CATH FARAWAVE ABLATION 31 (CATHETERS) IMPLANT
CATH OCTARAY 2.0 F 3-3-3-3-3 (CATHETERS) IMPLANT
CATH WEBSTER BI DIR CS D-F CRV (CATHETERS) IMPLANT
CLOSURE PERCLOSE PROSTYLE (VASCULAR PRODUCTS) IMPLANT
COVER SWIFTLINK CONNECTOR (BAG) ×1 IMPLANT
DEVICE CLOSURE MYNXGRIP 6/7F (Vascular Products) IMPLANT
DILATOR VESSEL 38 20CM 16FR (INTRODUCER) IMPLANT
GUIDEWIRE INQWIRE 1.5J.035X260 (WIRE) IMPLANT
INQWIRE 1.5J .035X260CM (WIRE) ×1
KIT VERSACROSS CNCT FARADRIVE (KITS) IMPLANT
MAT PREVALON FULL STRYKER (MISCELLANEOUS) IMPLANT
PACK EP LF (CUSTOM PROCEDURE TRAY) ×1 IMPLANT
PAD DEFIB RADIO PHYSIO CONN (PAD) ×1 IMPLANT
PATCH CARTO3 (PAD) IMPLANT
SHEATH FARADRIVE STEERABLE (SHEATH) IMPLANT
SHEATH PINNACLE 8F 10CM (SHEATH) IMPLANT
SHEATH PINNACLE 9F 10CM (SHEATH) IMPLANT
SHEATH PROBE COVER 6X72 (BAG) IMPLANT

## 2023-05-23 NOTE — Anesthesia Procedure Notes (Signed)
 Procedure Name: Intubation Date/Time: 05/23/2023 12:45 PM  Performed by: Cindie Ronal POUR, CRNAPre-anesthesia Checklist: Patient identified, Emergency Drugs available, Suction available and Patient being monitored Patient Re-evaluated:Patient Re-evaluated prior to induction Oxygen Delivery Method: Circle System Utilized Preoxygenation: Pre-oxygenation with 100% oxygen Induction Type: IV induction Ventilation: Oral airway inserted - appropriate to patient size and Two handed mask ventilation required Laryngoscope Size: Glidescope and 3 Grade View: Grade I Tube type: Oral Tube size: 7.5 mm Number of attempts: 1 Airway Equipment and Method: Stylet and Oral airway Placement Confirmation: ETT inserted through vocal cords under direct vision, positive ETCO2 and breath sounds checked- equal and bilateral Secured at: 22 cm Tube secured with: Tape Dental Injury: Teeth and Oropharynx as per pre-operative assessment

## 2023-05-23 NOTE — Interval H&P Note (Signed)
 History and Physical Interval Note:  05/23/2023 12:04 PM  Joel BROCKS Brazil Sr.  has presented today for surgery, with the diagnosis of afib.  The various methods of treatment have been discussed with the patient and family. After consideration of risks, benefits and other options for treatment, the patient has consented to  Procedure(s): ATRIAL FIBRILLATION ABLATION (N/A) as a surgical intervention.  The patient's history has been reviewed, patient examined, no change in status, stable for surgery.  I have reviewed the patient's chart and labs.  Questions were answered to the patient's satisfaction.     Darcell Sabino E Siobahn Worsley

## 2023-05-23 NOTE — Anesthesia Postprocedure Evaluation (Signed)
 Anesthesia Post Note  Patient: Wayman Hoard Comella Sr.  Procedure(s) Performed: ATRIAL FIBRILLATION ABLATION     Patient location during evaluation: PACU Anesthesia Type: General Level of consciousness: awake and alert Pain management: pain level controlled Vital Signs Assessment: post-procedure vital signs reviewed and stable Respiratory status: spontaneous breathing, nonlabored ventilation, respiratory function stable and patient connected to nasal cannula oxygen Cardiovascular status: blood pressure returned to baseline and stable Postop Assessment: no apparent nausea or vomiting Anesthetic complications: no  There were no known notable events for this encounter.  Last Vitals:  Vitals:   05/23/23 1530 05/23/23 1541  BP: 108/73 110/72  Pulse: 63   Resp: 14   Temp:    SpO2: 96%     Last Pain:  Vitals:   05/23/23 1553  TempSrc:   PainSc: 0-No pain                 Epifanio Lamar BRAVO

## 2023-05-23 NOTE — Anesthesia Preprocedure Evaluation (Signed)
 Anesthesia Evaluation  Patient identified by MRN, date of birth, ID band Patient awake    Reviewed: Allergy & Precautions, NPO status , Patient's Chart, lab work & pertinent test results  Airway Mallampati: II  TM Distance: >3 FB Neck ROM: Full    Dental  (+) Dental Advisory Given   Pulmonary asthma , former smoker   breath sounds clear to auscultation       Cardiovascular hypertension, Pt. on medications + CAD  + dysrhythmias Atrial Fibrillation  Rhythm:Regular Rate:Normal     Neuro/Psych  Headaches    GI/Hepatic Neg liver ROS, hiatal hernia,GERD  Medicated and Controlled,,  Endo/Other  diabetes, Type 2, Insulin  Dependent    Renal/GU Renal disease     Musculoskeletal  (+) Arthritis ,    Abdominal   Peds  Hematology negative hematology ROS (+)   Anesthesia Other Findings   Reproductive/Obstetrics                             Anesthesia Physical Anesthesia Plan  ASA: 3  Anesthesia Plan: General   Post-op Pain Management: Tylenol  PO (pre-op)*   Induction: Intravenous  PONV Risk Score and Plan: 2 and Dexamethasone , Ondansetron  and Treatment Dowdle vary due to age or medical condition  Airway Management Planned: Oral ETT  Additional Equipment:   Intra-op Plan:   Post-operative Plan: Extubation in OR  Informed Consent: I have reviewed the patients History and Physical, chart, labs and discussed the procedure including the risks, benefits and alternatives for the proposed anesthesia with the patient or authorized representative who has indicated his/her understanding and acceptance.     Dental advisory given  Plan Discussed with: CRNA  Anesthesia Plan Comments:        Anesthesia Quick Evaluation

## 2023-05-23 NOTE — Transfer of Care (Signed)
 Immediate Anesthesia Transfer of Care Note  Patient: Joel Ricke Mcwright Sr.  Procedure(s) Performed: ATRIAL FIBRILLATION ABLATION  Patient Location: PACU  Anesthesia Type:General  Level of Consciousness: awake and sedated  Airway & Oxygen Therapy: Patient Spontanous Breathing and Patient connected to face mask oxygen  Post-op Assessment: Report given to RN and Post -op Vital signs reviewed and stable  Post vital signs: Reviewed and stable  Last Vitals:  Vitals Value Taken Time  BP 95/75 05/23/23 1430  Temp 36.3 C 05/23/23 1429  Pulse 61 05/23/23 1440  Resp 13 05/23/23 1440  SpO2 96 % 05/23/23 1440  Vitals shown include unfiled device data.  Last Pain:  Vitals:   05/23/23 1429  TempSrc: Temporal  PainSc: 0-No pain         Complications: There were no known notable events for this encounter.

## 2023-05-23 NOTE — Discharge Instructions (Signed)

## 2023-05-24 ENCOUNTER — Encounter (HOSPITAL_COMMUNITY): Payer: Self-pay | Admitting: Cardiovascular Disease

## 2023-05-25 MED FILL — Fentanyl Citrate Preservative Free (PF) Inj 100 MCG/2ML: INTRAMUSCULAR | Qty: 2 | Status: AC

## 2023-05-28 ENCOUNTER — Encounter (HOSPITAL_BASED_OUTPATIENT_CLINIC_OR_DEPARTMENT_OTHER): Payer: Self-pay | Admitting: Emergency Medicine

## 2023-05-28 ENCOUNTER — Emergency Department (HOSPITAL_BASED_OUTPATIENT_CLINIC_OR_DEPARTMENT_OTHER): Payer: Medicare Other

## 2023-05-28 ENCOUNTER — Other Ambulatory Visit: Payer: Self-pay

## 2023-05-28 ENCOUNTER — Emergency Department (HOSPITAL_BASED_OUTPATIENT_CLINIC_OR_DEPARTMENT_OTHER)
Admission: EM | Admit: 2023-05-28 | Discharge: 2023-05-28 | Discharge status: Against Medical Advice | Payer: Medicare Other | Attending: Emergency Medicine | Admitting: Emergency Medicine

## 2023-05-28 ENCOUNTER — Telehealth: Payer: Self-pay

## 2023-05-28 DIAGNOSIS — Z5329 Procedure and treatment not carried out because of patient's decision for other reasons: Secondary | ICD-10-CM | POA: Insufficient documentation

## 2023-05-28 DIAGNOSIS — Z7901 Long term (current) use of anticoagulants: Secondary | ICD-10-CM | POA: Diagnosis not present

## 2023-05-28 DIAGNOSIS — K449 Diaphragmatic hernia without obstruction or gangrene: Secondary | ICD-10-CM | POA: Insufficient documentation

## 2023-05-28 DIAGNOSIS — K921 Melena: Secondary | ICD-10-CM | POA: Insufficient documentation

## 2023-05-28 DIAGNOSIS — E785 Hyperlipidemia, unspecified: Secondary | ICD-10-CM | POA: Diagnosis not present

## 2023-05-28 DIAGNOSIS — D649 Anemia, unspecified: Secondary | ICD-10-CM | POA: Diagnosis not present

## 2023-05-28 DIAGNOSIS — R0789 Other chest pain: Secondary | ICD-10-CM | POA: Diagnosis not present

## 2023-05-28 DIAGNOSIS — Z20822 Contact with and (suspected) exposure to covid-19: Secondary | ICD-10-CM | POA: Insufficient documentation

## 2023-05-28 DIAGNOSIS — R519 Headache, unspecified: Secondary | ICD-10-CM | POA: Insufficient documentation

## 2023-05-28 DIAGNOSIS — I4891 Unspecified atrial fibrillation: Secondary | ICD-10-CM | POA: Diagnosis not present

## 2023-05-28 DIAGNOSIS — J0130 Acute sphenoidal sinusitis, unspecified: Secondary | ICD-10-CM | POA: Insufficient documentation

## 2023-05-28 DIAGNOSIS — K5730 Diverticulosis of large intestine without perforation or abscess without bleeding: Secondary | ICD-10-CM | POA: Insufficient documentation

## 2023-05-28 DIAGNOSIS — R195 Other fecal abnormalities: Secondary | ICD-10-CM | POA: Insufficient documentation

## 2023-05-28 LAB — URINALYSIS, ROUTINE W REFLEX MICROSCOPIC
Bilirubin Urine: NEGATIVE
Glucose, UA: NEGATIVE mg/dL
Hgb urine dipstick: NEGATIVE
Ketones, ur: NEGATIVE mg/dL
Leukocytes,Ua: NEGATIVE
Nitrite: NEGATIVE
Protein, ur: NEGATIVE mg/dL
Specific Gravity, Urine: 1.013 (ref 1.005–1.030)
pH: 5.5 (ref 5.0–8.0)

## 2023-05-28 LAB — CBC WITH DIFFERENTIAL/PLATELET
Abs Immature Granulocytes: 0.04 10*3/uL (ref 0.00–0.07)
Basophils Absolute: 0.1 10*3/uL (ref 0.0–0.1)
Basophils Relative: 1 %
Eosinophils Absolute: 0.2 10*3/uL (ref 0.0–0.5)
Eosinophils Relative: 3 %
HCT: 33.1 % — ABNORMAL LOW (ref 39.0–52.0)
Hemoglobin: 11.6 g/dL — ABNORMAL LOW (ref 13.0–17.0)
Immature Granulocytes: 1 %
Lymphocytes Relative: 30 %
Lymphs Abs: 1.9 10*3/uL (ref 0.7–4.0)
MCH: 31 pg (ref 26.0–34.0)
MCHC: 35 g/dL (ref 30.0–36.0)
MCV: 88.5 fL (ref 80.0–100.0)
Monocytes Absolute: 0.8 10*3/uL (ref 0.1–1.0)
Monocytes Relative: 13 %
Neutro Abs: 3.3 10*3/uL (ref 1.7–7.7)
Neutrophils Relative %: 52 %
Platelets: 193 10*3/uL (ref 150–400)
RBC: 3.74 MIL/uL — ABNORMAL LOW (ref 4.22–5.81)
RDW: 13.4 % (ref 11.5–15.5)
WBC: 6.3 10*3/uL (ref 4.0–10.5)
nRBC: 0 % (ref 0.0–0.2)

## 2023-05-28 LAB — RESP PANEL BY RT-PCR (RSV, FLU A&B, COVID)  RVPGX2
Influenza A by PCR: NEGATIVE
Influenza B by PCR: NEGATIVE
Resp Syncytial Virus by PCR: NEGATIVE
SARS Coronavirus 2 by RT PCR: NEGATIVE

## 2023-05-28 LAB — COMPREHENSIVE METABOLIC PANEL
ALT: 45 U/L — ABNORMAL HIGH (ref 0–44)
AST: 30 U/L (ref 15–41)
Albumin: 3.9 g/dL (ref 3.5–5.0)
Alkaline Phosphatase: 96 U/L (ref 38–126)
Anion gap: 7 (ref 5–15)
BUN: 19 mg/dL (ref 8–23)
CO2: 27 mmol/L (ref 22–32)
Calcium: 8.8 mg/dL — ABNORMAL LOW (ref 8.9–10.3)
Chloride: 101 mmol/L (ref 98–111)
Creatinine, Ser: 1.04 mg/dL (ref 0.61–1.24)
GFR, Estimated: >60 mL/min (ref >60)
Glucose, Bld: 198 mg/dL — ABNORMAL HIGH (ref 70–99)
Potassium: 3.7 mmol/L (ref 3.5–5.1)
Sodium: 135 mmol/L (ref 135–145)
Total Bilirubin: 0.5 mg/dL (ref 0.0–1.2)
Total Protein: 6.1 g/dL — ABNORMAL LOW (ref 6.5–8.1)

## 2023-05-28 LAB — TROPONIN I (HIGH SENSITIVITY)
Troponin I (High Sensitivity): 62 ng/L — ABNORMAL HIGH (ref <18)
Troponin I (High Sensitivity): 55 ng/L — ABNORMAL HIGH (ref <18)

## 2023-05-28 LAB — PROTIME-INR
INR: 2.8 — ABNORMAL HIGH (ref 0.8–1.2)
Prothrombin Time: 29.4 s — ABNORMAL HIGH (ref 11.4–15.2)

## 2023-05-28 LAB — APTT: aPTT: 50 s — ABNORMAL HIGH (ref 24–36)

## 2023-05-28 LAB — OCCULT BLOOD X 1 CARD TO LAB, STOOL: Fecal Occult Bld: NEGATIVE

## 2023-05-28 LAB — LIPASE, BLOOD: Lipase: 19 U/L (ref 11–51)

## 2023-05-28 MED ORDER — IOHEXOL 350 MG/ML SOLN
100.0000 mL | Freq: Once | INTRAVENOUS | Status: AC | PRN
  Administered 2023-05-28: 100 mL via INTRAVENOUS

## 2023-05-28 MED ORDER — PANTOPRAZOLE SODIUM 40 MG PO TBEC: 40.0000 mg | DELAYED_RELEASE_TABLET | Freq: Every day | ORAL | 0 refills | Status: DC

## 2023-05-28 MED ORDER — PROCHLORPERAZINE EDISYLATE 10 MG/2ML IJ SOLN
10.0000 mg | Freq: Once | INTRAMUSCULAR | Status: AC
  Administered 2023-05-28: 10 mg via INTRAVENOUS
  Filled 2023-05-28: qty 2

## 2023-05-28 MED ORDER — DIPHENHYDRAMINE HCL 50 MG/ML IJ SOLN
25.0000 mg | Freq: Once | INTRAMUSCULAR | Status: AC
  Administered 2023-05-28: 25 mg via INTRAVENOUS
  Filled 2023-05-28: qty 1

## 2023-05-28 NOTE — ED Triage Notes (Signed)
 Pt endorses black stool x 2 days. Recent afib ablation on 1/8. Endorses thinners

## 2023-05-28 NOTE — ED Notes (Signed)
 Pt wishes to leave AMA. Pt was made of the potential risks of leaving AMA, including worsening of condition up to and including death, by both the provider and myself. Pt and wife state that they both understand. Pt was also instructed to return with any new or worsening symptoms.

## 2023-05-28 NOTE — Telephone Encounter (Signed)
 Spoke with patient, has had several dark tarry stools in the past week, now experiencing some dizziness today as well. Instructed patient to be evaluated at the emergency department.  Dr Nancey agrees, ED evaluation needed, also adding protonix  40 mg once daily.    Dreama, April, CMA  Philis Harlene SAUNDERS, RN Pt's Wife called and stated that since his Afib Ablation his hiatal hernia has been hurting pretty bad. Also, his stool is really black and looks like tar.  She would like a call back to discuss.  [phone]

## 2023-05-28 NOTE — Discharge Instructions (Addendum)
 Thank you for coming to [facility] Emergency Department. You were seen for headache, chest pain, and possible blood in your stool. You were found to have a sinus infection, mild but stable elevations in your troponin (heart enzyme), a new decrease in your hemoglobin from 14-->11, and your stool tested negative for blood.  Because of your risk factors we recommended that you be brought into the hospital for continued monitoring and further workup.  You have decided to leave AGAINST MEDICAL ADVICE.  You have accepted the risks of leaving including worsening of your condition, continued possible bleeding, cardiac events, up to and including permanent disability and death.  Please follow up with your cardiologist within 1 week.  Please also follow-up with your gastroenterologist and monitor your stool closely for continued blood.  Please return to the emergency department at your earliest convenience.

## 2023-05-28 NOTE — ED Provider Notes (Signed)
 [facility] EMERGENCY DEPARTMENT AT [facility] Provider Note   CSN: [id number] Arrival date & time: 05/28/23  1658     History  Chief Complaint  Patient presents with   Melena    Joel BROCKS Heaslip Sr. is a 78 y.o. male with PMH as listed below who presents with multiple complaints.  Patient complaining of chest pain, headache, abdominal pain, and black stools..   Headache began on Saturday, gradual onset, was worse with bending over, feels like pressure.  Associate with some blurry vision.  Has had similar headaches in the past with sinus congestion or pressure.  Does not endorse any facial pain, rhinorrhea.  Has had a cough that is nonproductive.  Endorses some intermittent nonexertional left-sided chest pain that feels like pressure radiating into his left arm.  No associated shortness of breath, nausea vomiting or diaphoresis.  Patient endorses epigastric abdominal pain that he associates with his hiatal hernia which is the same as normal.  However the chest pain is new and different.  Pt endorses black stool x 7 days. Having 3-4 BMs per day. Recent afib ablation on 05/22/22 and takes warfarin.  Per chart review last INR was 05/23/2023 and it was 3.0.  No recent changes to his warfarin dose.  Denies fever/chills, urinary symptoms, leg swelling.  No history of GI bleed in the past. Has reported recent arthrocentesis d/t hemarthrosis of the right knee for pain relief.   Past Medical History:  Diagnosis Date   A-fib [facility])    AKI (acute kidney injury) (HCC) 05/18/2021   Anemia    Arthritis    all over   Bradycardia    Chronic bronchitis (HCC)    get it just about q yr (03/17/2014)   Daily headache    here lately (03/17/2014); relates to sinuses   GERD (gastroesophageal reflux disease)    Hard of hearing    hearing aides bilat   Hiatal hernia    History of blood transfusion 2008   related to OR   History of kidney stones    Hyperlipidemia    Pneumonia 1972 X 1   Rheumatoid  arthritis (HCC)    Type II diabetes mellitus (HCC)    Wears glasses        Home Medications Prior to Admission medications   Medication Sig Start Date End Date Taking? Authorizing Provider  Abatacept  (ORENCIA  IV) Inject 750 mg into the vein every 28 (twenty-eight) days.    [provider]  acetaminophen  (TYLENOL ) 500 MG tablet Take 500-1,000 mg by mouth every 6 (six) hours as needed (pain.).    [provider]  amLODipine  (NORVASC ) 5 MG tablet TAKE 1 TABLET EVERY DAY Patient taking differently: Take 5 mg by mouth at bedtime. 11/13/22   [name], MD  Ascorbic Acid  (VITAMIN C  PO) Take 1,000 mg by mouth in the morning.    [provider]  Calcium Carbonate-Vit D-Min (CALCIUM 1200 PO) Take 1 tablet by mouth every evening.    [provider]  Cholecalciferol (VITAMIN D3 PO) Take 1,000 Units by mouth in the morning.    [provider]  fexofenadine (ALLEGRA) 180 MG tablet Take 180 mg by mouth in the morning.    [provider]  Insulin  Aspart, w/Niacinamide, (FIASP ) 100 UNIT/ML SOLN 100 Units. Insulin  pump 09/27/22   [provider]  insulin  glargine (LANTUS ) 100 UNIT/ML injection Inject 16 Units into the skin as needed (If pump goes out).    [provider]  Insulin  Human (INSULIN  PUMP) SOLN Inject into the skin continuous. Fiasp  77 units    [provider]  Multiple Vitamin (MULTIVITAMIN WITH MINERALS) TABS Take 1 tablet by mouth in the morning. Centrum    [provider]  ONE TOUCH ULTRA TEST test strip USE WHEN CHECKING BLOOD SUGAR QID. 05/26/16   [provider]  pantoprazole  (PROTONIX ) 40 MG tablet Take 1 tablet (40 mg total) by mouth daily. 05/28/23   Mealor, Augustus E, MD  Potassium 99 MG TABS Take 99 mg by mouth in the morning.    [provider]  pravastatin  (PRAVACHOL ) 40 MG tablet TAKE 1 TABLET EVERY EVENING 08/03/22   [name], MD  pyridoxine (B-6) 500 MG tablet Take  500 mg by mouth in the morning.    [provider]  ramipril  (ALTACE ) 5 MG capsule Take 1 capsule (5 mg total) by mouth daily. Patient taking differently: Take 5 mg by mouth every evening. 11/10/22   [name], MD  vitamin E  400 UNIT capsule Take 400 Units by mouth in the morning.    [provider]  warfarin (COUMADIN ) 5 MG tablet TAKE 1 TABLET BY MOUTH DAILY OR AS DIRECTED BY ANTICOAGULATION CLINIC Patient taking differently: No sig reported 03/26/23   Emelia Josefa HERO, NP  zinc gluconate 50 MG tablet Take 50 mg by mouth in the morning.    [provider]      Allergies    Codeine, Lipitor [atorvastatin], Invokana [canagliflozin], Losartan, Roxicodone  [oxycodone ], and Vicodin [hydrocodone -acetaminophen ]    Review of Systems   Review of Systems A 10 point review of systems was performed and is negative unless otherwise reported in HPI.  Physical Exam Updated Vital Signs BP 133/64   Pulse (!) 46   Temp 98.4 F (36.9 C)   Resp 20   SpO2 96%  Physical Exam General: Normal appearing male, lying in bed.  HEENT: PERRLA, EOMI, Sclera anicteric, MMM, trachea midline. Clear orohparynx.  Cardiology: RRR, no murmurs/rubs/gallops.   Resp: Normal respiratory rate and effort. CTAB, no wheezes, rhonchi, crackles.  Abd: Soft, mildly tender in epigastric region, non-distended. No rebound tenderness or guarding.  GU: Performed with nurse chaperone.  Normal-appearing external anal sphincter.  Brown stool on glove.  No induration, fluctuance, masses noted.  No hemorrhoids noted. Some mild healing ecchymoses in BL groin. MSK: No peripheral edema or signs of trauma. Extremities without deformity or TTP. No cyanosis or clubbing. Skin: warm, dry.  Back: No CVA tenderness Neuro: A&Ox4, CNs II-XII grossly intact. MAEs. Sensation grossly intact.  Psych: Normal mood and affect.   ED Results / Procedures / Treatments   Labs (all labs ordered are listed, but only abnormal  results are displayed) Labs Reviewed  COMPREHENSIVE METABOLIC PANEL - Abnormal; Notable for the following components:      Result Value   Glucose, Bld 198 (*)    Calcium 8.8 (*)    Total Protein 6.1 (*)    ALT 45 (*)    All other components within normal limits  CBC WITH DIFFERENTIAL/PLATELET - Abnormal; Notable for the following components:   RBC 3.74 (*)    Hemoglobin 11.6 (*)    HCT 33.1 (*)    All other components within normal limits  PROTIME-INR - Abnormal; Notable for the following components:   Prothrombin Time 29.4 (*)    INR 2.8 (*)    All other components within normal limits  APTT - Abnormal; Notable for the following components:  aPTT 50 (*)    All other components within normal limits  TROPONIN I (HIGH SENSITIVITY) - Abnormal; Notable for the following components:   Troponin I (High Sensitivity) 62 (*)    All other components within normal limits  TROPONIN I (HIGH SENSITIVITY) - Abnormal; Notable for the following components:   Troponin I (High Sensitivity) 55 (*)    All other components within normal limits  RESP PANEL BY RT-PCR (RSV, FLU A&B, COVID)  [id number]  OCCULT BLOOD X 1 CARD TO LAB, STOOL  LIPASE, BLOOD  URINALYSIS, ROUTINE W REFLEX MICROSCOPIC    EKG None  Radiology CXR: No acute cardiopulmonary findings.   CTH: 1. No acute intracranial abnormality. 2. Partial opacification of the sphenoid sinus.  CT angio GIB: No evidence of active GI bleeding on CT. Mild sigmoid diverticulosis, without evidence of diverticulitis.   Procedures Procedures    Medications Ordered in ED Medications  prochlorperazine  (COMPAZINE ) injection 10 mg (10 mg Intravenous Given 05/28/23 2009)  diphenhydrAMINE  (BENADRYL ) injection 25 mg (25 mg Intravenous Given 05/28/23 2008)  iohexol  (OMNIPAQUE ) 350 MG/ML injection 100 mL (100 mLs Intravenous Contrast Given 05/28/23 1951)    ED Course/ Medical Decision Making/ A&P                          Medical Decision  Making Amount and/or Complexity of Data Reviewed Labs: ordered. Decision-making details documented in ED Course. Radiology: ordered. Decision-making details documented in ED Course.  Risk Prescription drug management.    This patient presents to the ED for concern of multiple complaints, this involves an extensive number of treatment options, and is a complaint that carries with it a high risk of complications and morbidity.  I considered the following differential and admission for this acute, potentially life threatening condition.   MDM:    Patient with a new headache and it is very severe.  Worse with bending over is concerning for elevated intracranial pressure.  No acute neurologic symptoms but given his age and anticoagulation status as well as bleeding possibly in his stool as well as in his right knee joint consider bleeding elsewhere and will get CT head to rule out ICH or elevated intracranial pressure.  Also could have a sinusitis given his report of a similar headache in the past with sinus pain.  DDX for chest pain includes but is not limited to: Consider ACS vs aortic dissection given presenting sx. Patient therapeutic on warfarin, no tachypnea/tachycardia/hypoxia. Did have recent surgery but overall doubt PE. No c/f dissection.  Consider GERD, given known history.  Patient does have history of hypertension, hyperlipidemia, as well as diabetes which makes him high risk .  His troponins are reassuringly flat however they are mildly elevated.  All of this gives him a moderate to high heart score.  Will discuss with the patient about admission for trending troponin.  Patient reporting black stool or melena as well as upper abdominal pain.  CT reassuringly does not show any acute surgical cause of abdominal pain or active bleeding.  Consider possible cause of upper GI bleed.  Does have history of known hiatal hernia could have gastritis or PUD.  Also consider other causes of GI bleeding  including daily for lesion, AVM, mesenteric ischemia.  No history of liver disease to indicate variceal bleed.  No reported bright red blood per rectum.  Normal rectal exam and fecal occult is reassuringly negative here.  Is having a bleeding issue is they  report he is being treated with arthrocentesis for recurrent right knee hemarthrosis, could possibly have a supratherapeutic INR however it is 2.8 today which is reassuring as well.  However patient is noted to have a drop in his hemoglobin.  3 points from 1 month ago.  Unclear exact timeline of this drop but if he indeed is having black stool this could be very concerning, especially that he is taking warfarin.  Discussed with the patient about his workup and results.  Patient is hurting in his knee where he has known hemarthrosis and states he cannot take much else stronger than Tylenol  as he has negative reactions to opiates.  I advised patient should be admitted for observation, trending troponin as well as hemoglobin, and observation for possible melena though it is negative here in the ED.  Patient states he does not want to be admitted to the hospital and would rather follow-up outpatient.  He states he would like to call his cardiologist to follow-up.  Patient is aware that he could have potential worsening of his clinical status including possible rebleeding, continued chest pain, possible damage to his heart or worsening drop of his hemoglobin, and complications up to and including permanent disability and death.  Patient has agreed to these risks and would like to leave AGAINST MEDICAL ADVICE.  Patient states he will follow-up with his physicians outpatient.   Clinical Course as of 06/02/23 1049  Mon May 28, 2023  1919 Fecal Occult Blood, POC: NEGATIVE Negative for blood [HN]  1919 Troponin I (High Sensitivity)(!): 62 Mildly elevated. No ischemic signs on EKG. Will trend. [HN]  1919 Lipase: 19 neg [HN]  1919 Hemoglobin(!): 11.6 Baseline in  14, last was one month ago [HN]  1919 INR(!): 2.8 [HN]  1920 BUN: 19 Bun wnl [HN]  2107 CT ANGIO GI BLEED No evidence of active GI bleeding on CT.  Mild sigmoid diverticulosis, without evidence of diverticulitis.      [HN]  2107 CT Head Wo Contrast 1. No acute intracranial abnormality. 2. Partial opacification of the sphenoid sinus.   [HN]  2107 DG Chest Portable 1 View No acute cardiopulmonary findings. [HN]  2108 HEART score is 6 for risk factors, age, and initial troponin.  [HN]    Clinical Course User Index [HN] Franklyn Sid SAILOR, MD    Labs: I Ordered, and personally interpreted labs.  The pertinent results include: Those listed above  Imaging Studies ordered: I ordered imaging studies including CT head, chest x-ray, CT ab and pelvis with contrast I independently visualized and interpreted imaging. I agree with the radiologist interpretation  Additional history obtained from chart review, wife at bedside.   Cardiac Monitoring: The patient was maintained on a cardiac monitor.  I personally viewed and interpreted the cardiac monitored which showed an underlying rhythm of: NSR  Reevaluation: After the interventions noted above, I reevaluated the patient and found that they have :stayed the same  Social Determinants of Health: Lives independently with wife  Disposition:  AMA  Co morbidities that complicate the patient evaluation  Past Medical History:  Diagnosis Date   A-fib (HCC)    AKI (acute kidney injury) (HCC) 05/18/2021   Anemia    Arthritis    all over   Bradycardia    Chronic bronchitis (HCC)    get it just about q yr (03/17/2014)   Daily headache    here lately (03/17/2014); relates to sinuses   GERD (gastroesophageal reflux disease)    Hard of hearing  hearing aides bilat   Hiatal hernia    History of blood transfusion 2008   related to OR   History of kidney stones    Hyperlipidemia    Pneumonia 1972 X 1   Rheumatoid arthritis  (HCC)    Type II diabetes mellitus (HCC)    Wears glasses      Medicines No orders of the defined types were placed in this encounter.   I have reviewed the patients home medicines and have made adjustments as needed  Problem List / ED Course: Problem List Items Addressed This Visit   None Visit Diagnoses       Melena    -  Primary     Anemia, unspecified type         Atypical chest pain         Acute sphenoidal sinusitis, recurrence not specified       Relevant Medications   diphenhydrAMINE  (BENADRYL ) injection 25 mg (Completed)                   This note was created using dictation software, which Bailly contain spelling or grammatical errors.    Franklyn Sid SAILOR, MD 06/03/23 [phone]

## 2023-05-29 ENCOUNTER — Emergency Department (HOSPITAL_BASED_OUTPATIENT_CLINIC_OR_DEPARTMENT_OTHER)
Admission: EM | Admit: 2023-05-29 | Discharge: 2023-05-29 | Disposition: A | Discharge status: Home / Self Care | Payer: Medicare Other | Attending: Emergency Medicine | Admitting: Emergency Medicine

## 2023-05-29 ENCOUNTER — Telehealth: Payer: Self-pay | Admitting: Cardiology

## 2023-05-29 ENCOUNTER — Encounter (HOSPITAL_BASED_OUTPATIENT_CLINIC_OR_DEPARTMENT_OTHER): Payer: Self-pay | Admitting: Emergency Medicine

## 2023-05-29 ENCOUNTER — Other Ambulatory Visit: Payer: Self-pay

## 2023-05-29 DIAGNOSIS — D649 Anemia, unspecified: Secondary | ICD-10-CM | POA: Diagnosis not present

## 2023-05-29 DIAGNOSIS — R195 Other fecal abnormalities: Secondary | ICD-10-CM

## 2023-05-29 DIAGNOSIS — R079 Chest pain, unspecified: Secondary | ICD-10-CM | POA: Diagnosis present

## 2023-05-29 DIAGNOSIS — Z7901 Long term (current) use of anticoagulants: Secondary | ICD-10-CM | POA: Diagnosis not present

## 2023-05-29 DIAGNOSIS — R531 Weakness: Secondary | ICD-10-CM

## 2023-05-29 DIAGNOSIS — K921 Melena: Secondary | ICD-10-CM | POA: Insufficient documentation

## 2023-05-29 LAB — TROPONIN I (HIGH SENSITIVITY): Troponin I (High Sensitivity): 62 ng/L — ABNORMAL HIGH (ref <18)

## 2023-05-29 LAB — BASIC METABOLIC PANEL
Anion gap: 7 (ref 5–15)
BUN: 17 mg/dL (ref 8–23)
CO2: 27 mmol/L (ref 22–32)
Calcium: 9.2 mg/dL (ref 8.9–10.3)
Chloride: 101 mmol/L (ref 98–111)
Creatinine, Ser: 1.18 mg/dL (ref 0.61–1.24)
GFR, Estimated: >60 mL/min (ref >60)
Glucose, Bld: 297 mg/dL — ABNORMAL HIGH (ref 70–99)
Potassium: 3.9 mmol/L (ref 3.5–5.1)
Sodium: 135 mmol/L (ref 135–145)

## 2023-05-29 LAB — CBC WITH DIFFERENTIAL/PLATELET
Abs Immature Granulocytes: 0.04 10*3/uL (ref 0.00–0.07)
Basophils Absolute: 0.1 10*3/uL (ref 0.0–0.1)
Basophils Relative: 1 %
Eosinophils Absolute: 0.2 10*3/uL (ref 0.0–0.5)
Eosinophils Relative: 2 %
HCT: 37.1 % — ABNORMAL LOW (ref 39.0–52.0)
Hemoglobin: 12.9 g/dL — ABNORMAL LOW (ref 13.0–17.0)
Immature Granulocytes: 1 %
Lymphocytes Relative: 23 %
Lymphs Abs: 1.6 10*3/uL (ref 0.7–4.0)
MCH: 30.9 pg (ref 26.0–34.0)
MCHC: 34.8 g/dL (ref 30.0–36.0)
MCV: 89 fL (ref 80.0–100.0)
Monocytes Absolute: 0.8 10*3/uL (ref 0.1–1.0)
Monocytes Relative: 11 %
Neutro Abs: 4.2 10*3/uL (ref 1.7–7.7)
Neutrophils Relative %: 62 %
Platelets: 217 10*3/uL (ref 150–400)
RBC: 4.17 MIL/uL — ABNORMAL LOW (ref 4.22–5.81)
RDW: 13.6 % (ref 11.5–15.5)
WBC: 6.8 10*3/uL (ref 4.0–10.5)
nRBC: 0 % (ref 0.0–0.2)

## 2023-05-29 NOTE — ED Provider Notes (Signed)
 Joel Jones AT [facility] Provider Note   CSN: [id number] Arrival date & time: 05/29/23  1255     History  No chief complaint on file.   Joel BROCKS Palecek Sr. is a 78 y.o. male.  78 year old male with a history of atrial fibrillation status post ablation still on Coumadin  who presents emergency Jones for repeat evaluation.  Patient was seen yesterday for chest pain as well as dark stools.  He had a workup that included troponins that were elevated but flat.  Hemoccult was negative.  Hemoglobin was 11.6.  Says that he called his cardiologist today who referred him into the emergency [facility] to have repeat labs drawn since he left AMA.  Says that he has not had any chest pain today.  No more bloody stools.       Home Medications Prior to Admission medications   Medication Sig Start Date End Date Taking? Authorizing Provider  Abatacept  (ORENCIA  IV) Inject 750 mg into the vein every 28 (twenty-eight) days.    [provider]  acetaminophen  (TYLENOL ) 500 MG tablet Take 500-1,000 mg by mouth every 6 (six) hours as needed (pain.).    [provider]  amLODipine  (NORVASC ) 5 MG tablet TAKE 1 TABLET EVERY DAY Patient taking differently: Take 5 mg by mouth at bedtime. 11/13/22   Joel Suzzane POUR, MD  Ascorbic Acid  (VITAMIN C  PO) Take 1,000 mg by mouth in the morning.    [provider]  Calcium Carbonate-Vit D-Min (CALCIUM 1200 PO) Take 1 tablet by mouth every evening.    [provider]  Cholecalciferol (VITAMIN D3 PO) Take 1,000 Units by mouth in the morning.    [provider]  fexofenadine (ALLEGRA) 180 MG tablet Take 180 mg by mouth in the morning.    [provider]  Insulin  Aspart, w/Niacinamide, (FIASP ) 100 UNIT/ML SOLN 100 Units. Insulin  pump 09/27/22   [provider]  insulin  glargine (LANTUS ) 100 UNIT/ML injection Inject 16 Units into the skin as needed (If pump goes out).    [provider]  Insulin  Human (INSULIN  PUMP) SOLN Inject into the skin continuous. Fiasp  77 units    [provider]  Multiple Vitamin (MULTIVITAMIN WITH MINERALS) TABS Take 1 tablet by mouth in the morning. Centrum    [provider]  ONE TOUCH ULTRA TEST test strip USE WHEN CHECKING BLOOD SUGAR QID. 05/26/16   [provider]  pantoprazole  (PROTONIX ) 40 MG tablet Take 1 tablet (40 mg total) by mouth daily. 05/28/23   Jones, Joel E, MD  Potassium 99 MG TABS Take 99 mg by mouth in the morning.    [provider]  pravastatin  (PRAVACHOL ) 40 MG tablet TAKE 1 TABLET EVERY EVENING 08/03/22   Joel Suzzane POUR, MD  pyridoxine (B-6) 500 MG tablet Take 500 mg by mouth in the morning.    [provider]  ramipril  (ALTACE ) 5 MG capsule Take 1 capsule (5 mg total) by mouth daily. Patient taking differently: Take 5 mg by mouth every evening. 11/10/22   Joel Suzzane POUR, MD  vitamin E  400 UNIT capsule Take 400 Units by mouth in the morning.    [provider]  warfarin (COUMADIN ) 5 MG tablet TAKE 1 TABLET BY MOUTH DAILY OR AS DIRECTED BY ANTICOAGULATION CLINIC Patient taking differently: No sig reported 03/26/23   Joel Josefa HERO, NP  zinc gluconate 50 MG tablet Take 50 mg by mouth in the morning.    [provider]  Allergies    Codeine, Lipitor [atorvastatin], Invokana [canagliflozin], Losartan, Roxicodone  [oxycodone ], and Vicodin [hydrocodone -acetaminophen ]    Review of Systems   Review of Systems  Physical Exam Updated Vital Signs BP (!) 145/60   Pulse (!) 54   Temp 98.3 F (36.8 C) (Oral)   Resp 18   SpO2 96%  Physical Exam Vitals and nursing note reviewed.  Constitutional:      General: He is not in acute distress.    Appearance: He is well-developed.  HENT:     Head: Normocephalic and atraumatic.     Right Ear: External ear normal.     Left Ear: External ear normal.     Nose: Nose normal.  Eyes:     Extraocular  Movements: Extraocular movements intact.     Conjunctiva/sclera: Conjunctivae normal.     Pupils: Pupils are equal, round, and reactive to light.  Cardiovascular:     Rate and Rhythm: Normal rate and regular rhythm.     Heart sounds: Normal heart sounds.     Comments: Pain not reproducible Pulmonary:     Effort: Pulmonary effort is normal. No respiratory distress.     Breath sounds: Normal breath sounds.  Musculoskeletal:     Cervical back: Normal range of motion and neck supple.     Right lower leg: No edema.     Left lower leg: No edema.  Skin:    General: Skin is warm and dry.  Neurological:     Mental Status: He is alert. Mental status is at baseline.  Psychiatric:        Mood and Affect: Mood normal.        Behavior: Behavior normal.     ED Results / Procedures / Treatments   Labs (all labs ordered are listed, but only abnormal results are displayed) Labs Reviewed  CBC WITH DIFFERENTIAL/PLATELET - Abnormal; Notable for the following components:      Result Value   RBC 4.17 (*)    Hemoglobin 12.9 (*)    HCT 37.1 (*)    All other components within normal limits  BASIC METABOLIC PANEL - Abnormal; Notable for the following components:   Glucose, Bld 297 (*)    All other components within normal limits  TROPONIN I (HIGH SENSITIVITY) - Abnormal; Notable for the following components:   Troponin I (High Sensitivity) 62 (*)    All other components within normal limits    EKG EKG Interpretation Date/Time:  Tuesday May 29 2023 16:46:28 EST Ventricular Rate:  54 PR Interval:  164 QRS Duration:  106 QT Interval:  436 QTC Calculation: 414 R Axis:   76  Text Interpretation: Sinus rhythm Atrial premature complex Low voltage, precordial leads Probable anteroseptal infarct, old Confirmed by Joel Jones [phone]) on 05/29/2023 4:55:03 PM Also confirmed by Joel Jones [phone]), editor Jones, Joel [phone])  on 05/30/2023 7:54:13 AM  Radiology CT ANGIO GI BLEED Result  Date: 05/28/2023 CLINICAL DATA:  Abdominal plain, melena, recent AFib ablation EXAM: CTA ABDOMEN AND PELVIS WITHOUT AND WITH CONTRAST TECHNIQUE: Multidetector CT imaging of the abdomen and pelvis was performed using the standard protocol during bolus administration of intravenous contrast. Multiplanar reconstructed images and MIPs were obtained and reviewed to evaluate the vascular anatomy. RADIATION DOSE REDUCTION: This exam was performed according to the departmental dose-optimization program which includes automated exposure control, adjustment of the mA and/or kV according to patient size and/or use of iterative reconstruction technique. CONTRAST:  OMNIPAQUE  IOHEXOL  350 MG/ML SOLN COMPARISON:  CT  abdomen/pelvis dated 08/21/2012 FINDINGS: Lower chest: Lung bases are clear. Hepatobiliary: Liver is within normal limits. Gallbladder is unremarkable. No intrahepatic or extrahepatic dilatation. Pancreas: Within normal limits. Spleen: Within normal limits. Adrenals/Urinary Tract: Adrenal glands are within normal limits. Bilateral simple renal cysts, measuring up to 4.8 cm in the lateral left upper kidney (series 2/image 27), benign (Bosniak I). No follow-up is recommended. No hydronephrosis. Bladder is within normal limits. Stomach/Bowel: Stomach is within normal limits. No evidence of bowel obstruction. Normal appendix (series 12/image 85). Mild sigmoid diverticulosis, without evidence of diverticulitis. Following contrast administration, there is no intraluminal spillage of contrast to suggest active GI bleeding on CT. Vascular/Lymphatic: No evidence of abdominal aortic aneurysm. Atherosclerotic calcifications of the abdominal aorta and branch vessels, although vessels remain patent. No suspicious abdominopelvic lymphadenopathy. Reproductive: Prostate is unremarkable. Other: No abdominopelvic ascites. Musculoskeletal: Degenerative changes of the visualized thoracolumbar spine. IMPRESSION: No evidence of active  GI bleeding on CT. Mild sigmoid diverticulosis, without evidence of diverticulitis. Electronically Signed   By: Pinkie Pebbles M.D.   On: 05/28/2023 21:02   CT Head Wo Contrast Result Date: 05/28/2023 CLINICAL DATA:  Headache EXAM: CT HEAD WITHOUT CONTRAST TECHNIQUE: Contiguous axial images were obtained from the base of the skull through the vertex without intravenous contrast. RADIATION DOSE REDUCTION: This exam was performed according to the departmental dose-optimization program which includes automated exposure control, adjustment of the mA and/or kV according to patient size and/or use of iterative reconstruction technique. COMPARISON:  08/20/2021 FINDINGS: Brain: No mass,hemorrhage or extra-axial collection. Normal appearance of the parenchyma and CSF spaces. Vascular: Atherosclerotic calcification of the internal carotid arteries at the skull base. No abnormal hyperdensity of the major intracranial arteries or dural venous sinuses. Skull: Negative Sinuses/Orbits: Partial opacification of the sphenoid sinus. Other: None. IMPRESSION: 1. No acute intracranial abnormality. 2. Partial opacification of the sphenoid sinus. Electronically Signed   By: Franky Stanford M.D.   On: 05/28/2023 20:50   DG Chest Portable 1 View Result Date: 05/28/2023 CLINICAL DATA:  Chest pain. EXAM: PORTABLE CHEST 1 VIEW COMPARISON:  Chest radiograph dated March 10, 2022. FINDINGS: Patient is rotated to the right. Overlapping telemetry wires. The heart size and mediastinal contours are within normal limits. No focal consolidation, sizeable pleural effusion, or pneumothorax. No acute osseous abnormality. IMPRESSION: No acute cardiopulmonary findings. Electronically Signed   By: Harrietta Sherry M.D.   On: 05/28/2023 19:45    Procedures Procedures    Medications Ordered in ED Medications - No data to display  ED Course/ Medical Decision Making/ A&P                                 Medical Decision Making Amount  and/or Complexity of Data Reviewed Labs: ordered.   Joel BROCKS Marovich Sr. is a 78 y.o. male with comorbidities that complicate the patient evaluation including atrial fibrillation status post ablation still on Coumadin  who presents emergency Jones for repeat evaluation.   Initial Ddx:  MI, anemia, electrolyte abnormality, GI bleed, URI, generalized deconditioning  MDM/Course:  Patient presents emergency Jones with generalized weakness.  Was seen in the emergency Jones yesterday and left AGAINST MEDICAL ADVICE.  His cardiologist told him to come back to the emergency Jones today.  Not currently having any chest pain.  Has had some dark stool but had Hemoccult negative rectal exam yesterday.  On exam is overall well-appearing.  Hemoglobin today actually has improved from yesterday to  12.9.  His high-sensitivity troponin was largely unchanged at 62 which was the same as his initial troponin yesterday.  Upon re-evaluation patient was stable.  Since he is not actively having any chest pain today and he had an ablation which could explain his elevated troponin and the fact that he is requesting to go home and already has cardiology follow-up next week feel it is reasonable for him to continue his workup as an outpatient.  With his serial CBCs spaced approximately 1 day apart feel that his GI bleed is less likely especially with his negative Hemoccult yesterday.  Will also have him follow-up with his PCP.  This patient presents to the ED for concern of complaints listed in HPI, this involves an extensive number of treatment options, and is a complaint that carries with it a high risk of complications and morbidity. Disposition including potential need for admission considered.   Dispo: DC Home. Return precautions discussed including, but not limited to, those listed in the AVS. Allowed pt time to ask questions which were answered fully prior to dc.  Additional history obtained from  family Records reviewed Outpatient Clinic Notes The following labs were independently interpreted: Chemistry and show no acute abnormality I independently reviewed the following imaging with scope of interpretation limited to determining acute life threatening conditions related to emergency care: Chest x-ray and agree with the radiologist interpretation with the following exceptions: none I personally reviewed and interpreted cardiac monitoring: normal sinus rhythm  I personally reviewed and interpreted the pt's EKG: see above for interpretation  I have reviewed the patients home medications and made adjustments as needed Social Determinants of health:  Elderly  Portions of this note were generated with Scientist, clinical (histocompatibility and immunogenetics). Dictation errors Snedden occur despite best attempts at proofreading.     Final Clinical Impression(s) / ED Diagnoses Final diagnoses:  Generalized weakness  Dark stools  Anemia, unspecified type  Chest pain, unspecified type    Rx / DC Orders ED Discharge Orders     None         Joel Lamar BROCKS, MD 05/30/23 1118

## 2023-05-29 NOTE — Telephone Encounter (Signed)
 Daughter-in-law Toniann Fail) stated patient was seen in ED and wants a call to patient directly at [phone] regarding results of ED visit.  Patient has visit scheduled on 1/21.

## 2023-05-29 NOTE — Discharge Instructions (Signed)
 You were seen for your chest pain and dark stools in the emergency department.   Follow-up with your primary doctor in 2-3 days regarding your visit.  Follow-up as scheduled with cardiology.  Return immediately to the emergency department if you experience any of the following: Worsening pain, difficulty breathing, unexplained vomiting or sweating, or any other concerning symptoms.    Thank you for visiting our Emergency Department. It was a pleasure taking care of you today.

## 2023-05-29 NOTE — Telephone Encounter (Signed)
 Patient identification verified by 2 forms. Bertina Cooks, RN    Received call from patients daughter Darril Patriarca  Deane states:   -she works in [employer]   -unsure if patient can complete labs   -unsure what the plan is   -unsure if patient needs to go to ED  Informed Betsy:   -RN spoke to patients wife Michaelle today, advised patient to present to ED, per ED note patient left AMA yesterday, ED noted decrease in blood count, patient reported having continued weakness. Based on symptoms today and ED concern regarding bleeding yesterday, presenting to ED today for re-evaluation is best plan Betsy verbalized understanding, no questions at this time

## 2023-05-29 NOTE — Telephone Encounter (Signed)
 Patient identification verified by 2 forms. Joel Cooks, RN    Called and spoke to patients wife Joel Jones Joel Jones states:   -patient had ablation 05/23/23  -wants to know if patient needs to be seen in office prior to 1/21 appointment   -unsure if patient continues to have blood in stool   -patient has complaint of weakness today   -warfarin 5mg    -would like Dr. Pietro to order labs to check blood count   -Joel Jones did not want to wait in ED last night and wanted to go home  Joel Jones denies:   -lightheadedness/dizziness  -chest pain  Informed billie:   -per ED, patient left AMA   -ED concerned about decreased hemoglobin, concerned about bleeding   -since patient continues to feel weak today best to present to ED today  Joel Jones states she will try and encourage patient to go to ED today  Joel Jones has no further questions at this time

## 2023-05-29 NOTE — ED Triage Notes (Signed)
 Seen yesterday. Left AMA For black stool and weakness Talk to provider and was advice to be re evaluated

## 2023-05-30 ENCOUNTER — Ambulatory Visit: Payer: Medicare Other | Admitting: Orthopaedic Surgery

## 2023-05-30 ENCOUNTER — Ambulatory Visit (HOSPITAL_COMMUNITY)
Admission: RE | Admit: 2023-05-30 | Discharge: 2023-05-30 | Disposition: A | Discharge status: Home / Self Care | Payer: Medicare Other | Source: Ambulatory Visit | Attending: Rheumatology | Admitting: Rheumatology

## 2023-05-30 DIAGNOSIS — M1711 Unilateral primary osteoarthritis, right knee: Secondary | ICD-10-CM

## 2023-05-30 DIAGNOSIS — Z111 Encounter for screening for respiratory tuberculosis: Secondary | ICD-10-CM | POA: Insufficient documentation

## 2023-05-30 DIAGNOSIS — Z79899 Other long term (current) drug therapy: Secondary | ICD-10-CM | POA: Diagnosis present

## 2023-05-30 DIAGNOSIS — M25461 Effusion, right knee: Secondary | ICD-10-CM | POA: Diagnosis not present

## 2023-05-30 DIAGNOSIS — M0579 Rheumatoid arthritis with rheumatoid factor of multiple sites without organ or systems involvement: Secondary | ICD-10-CM | POA: Insufficient documentation

## 2023-05-30 LAB — COMPREHENSIVE METABOLIC PANEL
ALT: 41 U/L (ref 0–44)
AST: 31 U/L (ref 15–41)
Albumin: 3.7 g/dL (ref 3.5–5.0)
Alkaline Phosphatase: 104 U/L (ref 38–126)
Anion gap: 7 (ref 5–15)
BUN: 17 mg/dL (ref 8–23)
CO2: 28 mmol/L (ref 22–32)
Calcium: 9 mg/dL (ref 8.9–10.3)
Chloride: 103 mmol/L (ref 98–111)
Creatinine, Ser: 1.02 mg/dL (ref 0.61–1.24)
GFR, Estimated: >60 mL/min (ref >60)
Glucose, Bld: 122 mg/dL — ABNORMAL HIGH (ref 70–99)
Potassium: 3.7 mmol/L (ref 3.5–5.1)
Sodium: 138 mmol/L (ref 135–145)
Total Bilirubin: 0.7 mg/dL (ref 0.0–1.2)
Total Protein: 6.4 g/dL — ABNORMAL LOW (ref 6.5–8.1)

## 2023-05-30 LAB — CBC WITH DIFFERENTIAL/PLATELET
Abs Immature Granulocytes: 0.03 10*3/uL (ref 0.00–0.07)
Basophils Absolute: 0.1 10*3/uL (ref 0.0–0.1)
Basophils Relative: 1 %
Eosinophils Absolute: 0.2 10*3/uL (ref 0.0–0.5)
Eosinophils Relative: 4 %
HCT: 37.3 % — ABNORMAL LOW (ref 39.0–52.0)
Hemoglobin: 12.9 g/dL — ABNORMAL LOW (ref 13.0–17.0)
Immature Granulocytes: 1 %
Lymphocytes Relative: 34 %
Lymphs Abs: 2 10*3/uL (ref 0.7–4.0)
MCH: 31.4 pg (ref 26.0–34.0)
MCHC: 34.6 g/dL (ref 30.0–36.0)
MCV: 90.8 fL (ref 80.0–100.0)
Monocytes Absolute: 0.8 10*3/uL (ref 0.1–1.0)
Monocytes Relative: 14 %
Neutro Abs: 2.7 10*3/uL (ref 1.7–7.7)
Neutrophils Relative %: 46 %
Platelets: 226 10*3/uL (ref 150–400)
RBC: 4.11 MIL/uL — ABNORMAL LOW (ref 4.22–5.81)
RDW: 13.3 % (ref 11.5–15.5)
WBC: 5.8 10*3/uL (ref 4.0–10.5)
nRBC: 0 % (ref 0.0–0.2)

## 2023-05-30 MED ORDER — ACETAMINOPHEN 325 MG PO TABS
650.0000 mg | ORAL_TABLET | ORAL | Status: DC
Start: 2023-05-30 — End: 2023-08-22

## 2023-05-30 MED ORDER — SODIUM CHLORIDE 0.9 % IV SOLN
750.0000 mg | INTRAVENOUS | Status: DC
  Administered 2023-05-30: 750 mg via INTRAVENOUS
  Filled 2023-05-30: qty 30

## 2023-05-30 MED ORDER — DIPHENHYDRAMINE HCL 25 MG PO CAPS: 25.0000 mg | ORAL_CAPSULE | ORAL | Status: DC

## 2023-05-30 MED ORDER — ACETAMINOPHEN 10 MG/ML IV SOLN: 1000.0000 mg | Freq: Once | INTRAVENOUS | Status: DC

## 2023-05-30 NOTE — Progress Notes (Signed)
 Glucose is mildly elevated, probably not a fasting sample.  CBC is stable with low hemoglobin.  Please forward results to his PCP.

## 2023-05-30 NOTE — Progress Notes (Signed)
 [name] comes in today requesting an aspiration of his right knee.  He has known significant arthritis in that right knee and a previous partial knee replacement.  He has recently had a cardiac ablation as well.  He is still on blood thinning medications.  He was just in the emergency room yesterday due to chest pain but he was ruled out for any type of significant cardiac event.  His right knee does have a moderate effusion today.  I was able to aspirate about 25 cc of fluid from his knee.  It was too early to place a steroid injection back in his knee.  We could not really do that until March of this year.  Follow-up is as needed knowing that he would likely need an aspiration at some point again and they know as well so they can be walking again later.

## 2023-05-31 NOTE — Telephone Encounter (Signed)
Patient returned to the ER

## 2023-06-01 ENCOUNTER — Ambulatory Visit (INDEPENDENT_AMBULATORY_CARE_PROVIDER_SITE_OTHER): Payer: Medicare Other

## 2023-06-01 ENCOUNTER — Ambulatory Visit: Payer: Medicare Other | Attending: Rheumatology | Admitting: Rheumatology

## 2023-06-01 ENCOUNTER — Other Ambulatory Visit: Payer: Self-pay | Admitting: Internal Medicine

## 2023-06-01 ENCOUNTER — Encounter: Payer: Self-pay | Admitting: Rheumatology

## 2023-06-01 ENCOUNTER — Encounter: Payer: Self-pay | Admitting: Internal Medicine

## 2023-06-01 VITALS — BP 118/66 | HR 47 | Resp 14 | Ht 66.0 in | Wt 206.0 lb

## 2023-06-01 DIAGNOSIS — Z79899 Other long term (current) drug therapy: Secondary | ICD-10-CM | POA: Diagnosis not present

## 2023-06-01 DIAGNOSIS — M0579 Rheumatoid arthritis with rheumatoid factor of multiple sites without organ or systems involvement: Secondary | ICD-10-CM | POA: Diagnosis not present

## 2023-06-01 DIAGNOSIS — M19072 Primary osteoarthritis, left ankle and foot: Secondary | ICD-10-CM

## 2023-06-01 DIAGNOSIS — Z5181 Encounter for therapeutic drug level monitoring: Secondary | ICD-10-CM | POA: Diagnosis not present

## 2023-06-01 DIAGNOSIS — Z8639 Personal history of other endocrine, nutritional and metabolic disease: Secondary | ICD-10-CM

## 2023-06-01 DIAGNOSIS — M51369 Other intervertebral disc degeneration, lumbar region without mention of lumbar back pain or lower extremity pain: Secondary | ICD-10-CM

## 2023-06-01 DIAGNOSIS — Z96652 Presence of left artificial knee joint: Secondary | ICD-10-CM

## 2023-06-01 DIAGNOSIS — J0101 Acute recurrent maxillary sinusitis: Secondary | ICD-10-CM

## 2023-06-01 DIAGNOSIS — R5383 Other fatigue: Secondary | ICD-10-CM

## 2023-06-01 DIAGNOSIS — Z7901 Long term (current) use of anticoagulants: Secondary | ICD-10-CM | POA: Diagnosis not present

## 2023-06-01 DIAGNOSIS — Z8679 Personal history of other diseases of the circulatory system: Secondary | ICD-10-CM

## 2023-06-01 DIAGNOSIS — M19041 Primary osteoarthritis, right hand: Secondary | ICD-10-CM | POA: Diagnosis not present

## 2023-06-01 DIAGNOSIS — I4891 Unspecified atrial fibrillation: Secondary | ICD-10-CM

## 2023-06-01 DIAGNOSIS — F5104 Psychophysiologic insomnia: Secondary | ICD-10-CM

## 2023-06-01 DIAGNOSIS — M19071 Primary osteoarthritis, right ankle and foot: Secondary | ICD-10-CM

## 2023-06-01 DIAGNOSIS — M19042 Primary osteoarthritis, left hand: Secondary | ICD-10-CM

## 2023-06-01 DIAGNOSIS — M5030 Other cervical disc degeneration, unspecified cervical region: Secondary | ICD-10-CM

## 2023-06-01 DIAGNOSIS — Z96651 Presence of right artificial knee joint: Secondary | ICD-10-CM | POA: Diagnosis not present

## 2023-06-01 LAB — POCT INR: INR: 3.9 — AB (ref 2.0–3.0)

## 2023-06-01 MED ORDER — AMOXICILLIN-POT CLAVULANATE 875-125 MG PO TABS: 1.0000 | ORAL_TABLET | Freq: Two times a day (BID) | ORAL | 0 refills | Status: DC

## 2023-06-01 NOTE — Patient Instructions (Signed)
Hold today only and eat greens tonight then Continue taking warfarin 1 tablet daily except for 1/2 tablet on Fridays.  Stay consistent with greens each week (1 per week).  Recheck INR in 3 weeks  [facility] [phone]

## 2023-06-01 NOTE — Progress Notes (Signed)
Lipid panel order added for Actemra monitoring  Chesley Mires, PharmD, MPH, BCPS, CPP Clinical Pharmacist (Rheumatology and Pulmonology)

## 2023-06-01 NOTE — Addendum Note (Signed)
Addended by: Murrell Redden on: 06/01/2023 11:13 AM   Modules accepted: Orders

## 2023-06-01 NOTE — Patient Instructions (Signed)
Vaccines You are taking a medication(s) that can suppress your immune system.  The following immunizations are recommended: Flu annually Covid-19  RSV Td/Tdap (tetanus, diphtheria, pertussis) every 10 years Pneumonia (Prevnar 15 then Pneumovax 23 at least 1 year apart.  Alternatively, can take Prevnar 20 without needing additional dose) Shingrix: 2 doses from 4 weeks to 6 months apart  Please check with your PCP to make sure you are up to date.   If you have signs or symptoms of an infection or start antibiotics: First, call your PCP for workup of your infection. Hold your medication through the infection, until you complete your antibiotics, and until symptoms resolve if you take the following: Injectable medication (Actemra, Benlysta, Cimzia, Cosentyx, Enbrel, Humira, Kevzara, Orencia, Remicade, Simponi, Stelara, Taltz, Tremfya) Methotrexate Leflunomide (Arava) Mycophenolate (Cellcept) Harriette Ohara, Olumiant, or Rinvoq

## 2023-06-01 NOTE — Addendum Note (Signed)
Addended byTrena Platt on: 06/01/2023 12:23 PM   Modules accepted: Orders

## 2023-06-03 NOTE — Progress Notes (Unsigned)
Cardiology Office Note    Date:  06/05/2023  ID:  Joel Cower Ringold Sr., DOB 23-Aug-1945, MRN [id number] PCP:  [name], MD  Cardiologist:  Olga Millers, MD  Electrophysiologist:  Maurice Small, MD   Chief Complaint: Hospital follow up   History of Present Illness: .    Joel Cower Landrus Sr. is a 78 y.o. male with visit-pertinent history of insulin-dependent type 2 diabetes, osteoarthritis, hyperlipidemia, primary insomnia, obesity, coronary artery disease, sinus bradycardia and hypertension.  He had a catheterization in 2004 that showed nonobstructive disease.  Echo in 06/2017 showed an EF of 60 to 65%.  Patient was lost to follow-up and presented to clinic in 09/2022 for follow-up evaluation stated noted some increased fatigue which she attributed to age.  His EKG showed new atrial fibrillation that was rate controlled at 77 bpm.  He was started on Eliquis 5 mg twice daily with plan to return to clinic in 3 weeks for follow-up evaluation.  He underwent successful DCCV on 11/08/2022 with 200 J x 1.  Post EKG showed sinus bradycardia.  At follow-up visit on 12/12/2022 his EKG again showed atrial fibrillation at 68 bpm.  He was referred to A-fib clinic and Coumadin clinic as they wish to switch to Coumadin as Eliquis was too expensive.  On 02/09/2023 he was evaluated by Dr. Nelly Laurence, patient was agreeable to A-fib ablation.  Patient was seen by Dr. Jens [name] on 03/15/2023.  Patient reported a chest tightness that radiates to his left shoulder that occurred with exertion and at rest.  He reported it was improved with cardioversion zoomed when he went back into atrial fibrillation.  A stress nuclear study was recommended.  Nuclear stress test on 03/20/2023 showed a small apical perfusion defect with normal function most consistent with artifact, low normal LVEF, felt to be low risk study.  On 05/23/2023 he underwent an atrial fibrillation ablation with Dr. Nelly Laurence.  On 05/28/2023 patient presented to the ED with  complaints of headache, chest pain, abdominal pain and black stools.  Patient reported that on Saturday he began to have headaches that was gradual onset, worse with bending over and folic pressure.  He had associated blurry vision.  Reported similar headaches in the past with sinus congestion and pressure.  Patient endorsed black stools for 7 days with having 3-4 bowel movements per day.  Patient's hemoglobin was 11.6, hematocrit 33.1, troponin 62 >>55, INR 2.8.  Patient rectal exam and fecal occult were negative.  It was recommended that patient be admitted for further evaluation however patient left AMA.  Patient return to the ED on 05/29/2023 after notifying the office of the events of the prior day.  He was instructed to return to the ED as he left AMA.  Patient denied any further chest discomfort and no further bloody stools.  Hemoglobin was 12.9 and hematocrit 37.1, troponin was 62.  It was recommended that he follow-up with his primary cardiologist.  Today he presents for hospital follow-up.  He reports that he is doing well.  He denies any further dark stools, denies any frank blood.  He notes that when he overexerts himself he has an occasional slight tightness in his left chest that quickly goes away with stopping of activity.  Noted this started occurring following his ablation.  He denies any associated symptoms.  He reports that his heart rate has always consistently been in the 50s.  He reports adherence with his Coumadin.  He notes his biggest concern is  his right knee, he has had a partial knee replacement, in the future plans to undergo a total knee replacement, notes that he has problems with fluid accumulation in the knee.  He also notes some intermittent left finger tingling that resolves with moving of his arm.  ROS: .   Today he denies shortness of breath, fatigue, palpitations, melena, hematuria, hemoptysis, diaphoresis, weakness, presyncope, syncope, orthopnea, and PND.  All other  systems are reviewed and otherwise negative. Studies Reviewed: Marland Kitchen    EKG:  EKG is ordered today.  CV Studies: Cardiac Studies & Procedures     STRESS TESTS  MYOCARDIAL PERFUSION IMAGING 03/20/2023  Narrative   Findings are consistent with no ischemia. The study is low risk.   No ST deviation was noted.   LV perfusion is abnormal. Defect 1: There is a small defect with mild reduction in uptake present in the apical inferior location(s) that is fixed. Viability is present. There is normal wall motion in the defect area. Consistent with artifact.   Left ventricular function is abnormal. Nuclear stress EF: 52%. The left ventricular ejection fraction is mildly decreased (45-54%). End diastolic cavity size is normal. End systolic cavity size is normal.   Prior study not available for comparison.  Small apical perfusion defect with normal function most consistent with artifact. Low normal LVEF that appears visualize normal; sensitivity of LVEF assessment is decreased in the setting of atrial fibrillation imaging. Low risk study.  ECHOCARDIOGRAM  ECHOCARDIOGRAM COMPLETE 11/27/2022  Narrative ECHOCARDIOGRAM REPORT    Patient Name:   Joel Kopplin Narez Sr. Date of Exam: 11/27/2022 Medical Rec #:  [id number]        Height:       66.0 in Accession #:    [id number]       Weight:       203.0 lb Date of Birth:  1946-01-02         BSA:          2.012 m Patient Age:    76 years         BP:           112/60 mmHg Patient Gender: M                HR:           61 bpm. Exam Location:  [address]  Procedure: 2D Echo, Cardiac Doppler and Color Doppler  Indications:    I48.91 Atrial Fibrillation  History:        Patient has prior history of Echocardiogram examinations, most recent 06/20/2017. Risk Factors:Diabetes and Dyslipidemia.  Sonographer:    Daphine Deutscher RDCS Referring Phys: [id number] JESSE M CLEAVER  IMPRESSIONS   1. Left ventricular ejection fraction, by estimation, is 60 to 65%.  The left ventricle has normal function. The left ventricle has no regional wall motion abnormalities. There is mild concentric left ventricular hypertrophy. Left ventricular diastolic function could not be evaluated. 2. Right ventricular systolic function is normal. The right ventricular size is normal. 3. Left atrial size was mildly dilated. 4. Right atrial size was mildly dilated. 5. The mitral valve is normal in structure. Trivial mitral valve regurgitation. No evidence of mitral stenosis. 6. Tricuspid valve regurgitation is moderate to severe. 7. The aortic valve is normal in structure. There is mild calcification of the aortic valve. There is mild thickening of the aortic valve. Aortic valve regurgitation is not visualized. No aortic stenosis is present. 8. The inferior vena cava is normal in  size with greater than 50% respiratory variability, suggesting right atrial pressure of 3 mmHg.  FINDINGS Left Ventricle: Left ventricular ejection fraction, by estimation, is 60 to 65%. The left ventricle has normal function. The left ventricle has no regional wall motion abnormalities. The left ventricular internal cavity size was normal in size. There is mild concentric left ventricular hypertrophy. Left ventricular diastolic function could not be evaluated.  Right Ventricle: The right ventricular size is normal. No increase in right ventricular wall thickness. Right ventricular systolic function is normal.  Left Atrium: Left atrial size was mildly dilated.  Right Atrium: Right atrial size was mildly dilated.  Pericardium: There is no evidence of pericardial effusion.  Mitral Valve: The mitral valve is normal in structure. Trivial mitral valve regurgitation. No evidence of mitral valve stenosis.  Tricuspid Valve: The tricuspid valve is normal in structure. Tricuspid valve regurgitation is moderate to severe. No evidence of tricuspid stenosis.  Aortic Valve: The aortic valve is normal in  structure. There is mild calcification of the aortic valve. There is mild thickening of the aortic valve. Aortic valve regurgitation is not visualized. No aortic stenosis is present.  Pulmonic Valve: The pulmonic valve was normal in structure. Pulmonic valve regurgitation is not visualized. No evidence of pulmonic stenosis.  Aorta: The aortic root is normal in size and structure.  Venous: The inferior vena cava is normal in size with greater than 50% respiratory variability, suggesting right atrial pressure of 3 mmHg.  IAS/Shunts: No atrial level shunt detected by color flow Doppler.   LEFT VENTRICLE PLAX 2D LVIDd:         4.70 cm LVIDs:         3.40 cm LV PW:         1.00 cm LV IVS:        1.00 cm LVOT diam:     2.20 cm LV SV:         40 LV SV Index:   20 LVOT Area:     3.80 cm   RIGHT VENTRICLE RV Basal diam:  4.70 cm RV S prime:     10.17 cm/s TAPSE (M-mode): 2.4 cm  LEFT ATRIUM             Index        RIGHT ATRIUM           Index LA diam:        5.10 cm 2.53 cm/m   RA Area:     21.80 cm LA Vol (A2C):   68.8 ml 34.19 ml/m  RA Volume:   70.60 ml  35.08 ml/m LA Vol (A4C):   69.9 ml 34.73 ml/m LA Biplane Vol: 73.6 ml 36.57 ml/m AORTIC VALVE LVOT Vmax:   57.35 cm/s LVOT Vmean:  39.000 cm/s LVOT VTI:    0.105 m  AORTA Ao Root diam: 3.80 cm Ao Asc diam:  3.60 cm  MV E velocity: 84.73 cm/s  TRICUSPID VALVE TR Peak grad:   25.6 mmHg TR Vmax:        253.00 cm/s  SHUNTS Systemic VTI:  0.11 m Systemic Diam: 2.20 cm  Aditya Sabharwal Electronically signed by Dorthula Nettles Signature Date/Time: 11/27/2022/1:22:28 PM    Final               Current Reported Medications:.    Current Meds  Medication Sig   Abatacept (ORENCIA IV) Inject 750 mg into the vein every 28 (twenty-eight) days.   acetaminophen (TYLENOL) 500 MG tablet Take 500-1,000  mg by mouth every 6 (six) hours as needed (pain.).   amLODipine (NORVASC) 5 MG tablet TAKE 1 TABLET EVERY DAY  (Patient taking differently: Take 5 mg by mouth at bedtime.)   amoxicillin-clavulanate (AUGMENTIN) 875-125 MG tablet Take 1 tablet by mouth 2 (two) times daily.   Ascorbic Acid (VITAMIN C PO) Take 1,000 mg by mouth in the morning.   Calcium Carbonate-Vit D-Min (CALCIUM 1200 PO) Take 1 tablet by mouth every evening.   Cholecalciferol (VITAMIN D3 PO) Take 1,000 Units by mouth in the morning.   fexofenadine (ALLEGRA) 180 MG tablet Take 180 mg by mouth in the morning.   Insulin Aspart, w/Niacinamide, (FIASP) 100 UNIT/ML SOLN 100 Units. Insulin pump   insulin glargine (LANTUS) 100 UNIT/ML injection Inject 16 Units into the skin as needed (If pump goes out).   Insulin Human (INSULIN PUMP) SOLN Inject into the skin continuous. Fiasp 77 units   Multiple Vitamin (MULTIVITAMIN WITH MINERALS) TABS Take 1 tablet by mouth in the morning. Centrum   ONE TOUCH ULTRA TEST test strip USE WHEN CHECKING BLOOD SUGAR QID.   pantoprazole (PROTONIX) 40 MG tablet Take 1 tablet (40 mg total) by mouth daily.   Potassium 99 MG TABS Take 99 mg by mouth in the morning.   pravastatin (PRAVACHOL) 40 MG tablet TAKE 1 TABLET EVERY EVENING   pyridoxine (B-6) 500 MG tablet Take 500 mg by mouth in the morning.   ramipril (ALTACE) 5 MG capsule Take 1 capsule (5 mg total) by mouth daily. (Patient taking differently: Take 5 mg by mouth every evening.)   vitamin E 400 UNIT capsule Take 400 Units by mouth in the morning.   warfarin (COUMADIN) 5 MG tablet TAKE 1 TABLET BY MOUTH DAILY OR AS DIRECTED BY ANTICOAGULATION CLINIC   zinc gluconate 50 MG tablet Take 50 mg by mouth in the morning.    Physical Exam:    VS:  BP 134/64 (BP Location: Right Arm)   Pulse (!) 58   Ht 5\' 6"  (1.676 m)   Wt 208 lb (94.3 kg)   SpO2 96%   BMI 33.57 kg/m    Wt Readings from Last 3 Encounters:  06/05/23 208 lb (94.3 kg)  06/01/23 206 lb (93.4 kg)  05/30/23 206 lb (93.4 kg)    GEN: Well nourished, well developed in no acute distress NECK: No JVD;  No carotid bruits CARDIAC: RRR, no murmurs, rubs, gallops RESPIRATORY:  Clear to auscultation without rales, wheezing or rhonchi  ABDOMEN: Soft, non-tender, non-distended EXTREMITIES:  Right knee edema; No acute deformity   Asessement and Plan:.    Persistent atrial fibrillation: s/p afib ablation with Dr. Nelly Laurence on 05/23/23.  EKG on 06/01/2023 indicated sinus bradycardia at 54 bpm.  On auscultation today he has regular rate and rhythm.  He denies any feelings of palpitations or increased heart rate.  He reports that he is tolerating his Coumadin well, denies any significant bleeding, followed by Coumadin clinic.  He notes that when he is not in A-fib his heart rate is always in the 50s, denies any presyncope or syncope.  Continue Coumadin per Coumadin clinic.  GI bleeding?:  On 05/28/2023 patient presented to the ED with complaints of black stools for 7 days, hemoglobin 11.6.  Fecal occult was negative.  On 12/14 his hemoglobin is 12.9 and hematocrit 37.1.  He denies any further dark stools or any frank bleeding. Check CBC today.   Precordial pain: Patient had low risk MPI on 03/20/2023.  On ED visit  on 1/13 patient noticed some chest discomfort, troponins 62 then 55.  Troponin on 1/14 was 62 likely slightly elevated given recent ablation.  Patient denies any significant chest pain or pressure, he notes that when he is exerting himself too much will have a slight tightness that he noted following his ablation but nearly immediately stops when he stops his activity.  He will continue to monitor and notify the office if he has no improvement or if it worsens.  HTN: Blood pressure today 134/64.  Continue amlodipine.    Disposition: F/u with afib clinic on 06/20/23.   Signed, Rip Harbour, NP

## 2023-06-04 ENCOUNTER — Other Ambulatory Visit: Payer: Self-pay | Admitting: General Practice

## 2023-06-04 DIAGNOSIS — I4891 Unspecified atrial fibrillation: Secondary | ICD-10-CM

## 2023-06-04 DIAGNOSIS — Z5181 Encounter for therapeutic drug level monitoring: Secondary | ICD-10-CM

## 2023-06-05 ENCOUNTER — Ambulatory Visit: Payer: Medicare Other | Attending: Cardiology | Admitting: Cardiology

## 2023-06-05 ENCOUNTER — Encounter: Payer: Self-pay | Admitting: Cardiology

## 2023-06-05 VITALS — BP 134/64 | HR 58 | Ht 66.0 in | Wt 208.0 lb

## 2023-06-05 DIAGNOSIS — I10 Essential (primary) hypertension: Secondary | ICD-10-CM

## 2023-06-05 DIAGNOSIS — I4819 Other persistent atrial fibrillation: Secondary | ICD-10-CM | POA: Diagnosis not present

## 2023-06-05 DIAGNOSIS — R072 Precordial pain: Secondary | ICD-10-CM

## 2023-06-05 LAB — CBC
Hematocrit: 37.9 % (ref 37.5–51.0)
Hemoglobin: 12.4 g/dL — ABNORMAL LOW (ref 13.0–17.7)
MCH: 30.8 pg (ref 26.6–33.0)
MCHC: 32.7 g/dL (ref 31.5–35.7)
MCV: 94 fL (ref 79–97)
Platelets: 277 10*3/uL (ref 150–450)
RBC: 4.02 x10E6/uL — ABNORMAL LOW (ref 4.14–5.80)
RDW: 12.9 % (ref 11.6–15.4)
WBC: 7.3 10*3/uL (ref 3.4–10.8)

## 2023-06-05 NOTE — Patient Instructions (Signed)
Medication Instructions:  No changes *If you need a refill on your cardiac medications before your next appointment, please call your pharmacy*  Lab Work: Today we are going to draw a CBC If you have labs (blood work) drawn today and your tests are completely normal, you will receive your results only by: MyChart Message (if you have MyChart) OR A paper copy in the mail If you have any lab test that is abnormal or we need to change your treatment, we will call you to review the results.  Testing/Procedures: No testing  Follow-Up: At [facility], you and your health needs are our priority.  As part of our continuing mission to provide you with exceptional heart care, we have created designated Provider Care Teams.  These Care Teams include your primary Cardiologist (physician) and Advanced Practice Providers (APPs -  Physician Assistants and Nurse Practitioners) who all work together to provide you with the care you need, when you need it.  We recommend signing up for the patient portal called "MyChart".  Sign up information is provided on this After Visit Summary.  MyChart is used to connect with patients for Virtual Visits (Telemedicine).  Patients are able to view lab/test results, encounter notes, upcoming appointments, etc.  Non-urgent messages can be sent to your provider as well.   To learn more about what you can do with MyChart, go to [email or web address].    Your next appointment:   Already Scheduled

## 2023-06-06 ENCOUNTER — Encounter: Payer: Self-pay | Admitting: General Practice

## 2023-06-07 ENCOUNTER — Ambulatory Visit: Payer: Medicare Other | Admitting: Physician Assistant

## 2023-06-07 ENCOUNTER — Telehealth: Payer: Self-pay

## 2023-06-07 DIAGNOSIS — M1711 Unilateral primary osteoarthritis, right knee: Secondary | ICD-10-CM

## 2023-06-07 MED ORDER — BUPIVACAINE HCL 0.25 % IJ SOLN
2.0000 mL | INTRAMUSCULAR | Status: AC | PRN
  Administered 2023-06-07: 2 mL via INTRA_ARTICULAR

## 2023-06-07 MED ORDER — LIDOCAINE HCL 1 % IJ SOLN
2.0000 mL | INTRAMUSCULAR | Status: AC | PRN
  Administered 2023-06-07: 2 mL

## 2023-06-07 NOTE — Telephone Encounter (Signed)
-----   Message from Rip Harbour sent at 06/06/2023 10:29 PM EST ----- Please let Joel Jones know that his hemoglobin is similar to level last week, his hematocrit has improved. Continue to watch for any further bleeding. Follow up with afib clinic as scheduled.

## 2023-06-07 NOTE — Progress Notes (Signed)
Office Visit Note   Patient: Joel Evoy Kwiecinski Sr.           Date of Birth: [birth year]           MRN: [id number] Visit Date: 06/07/2023              Requested by: [name], MD [address] [place],  [place] [address] PCP: [name], MD   Assessment & Plan: Visit Diagnoses:  1. Unilateral primary osteoarthritis, right knee     Plan: Impression is right knee arthritis flare.  Today, we aspirated approximately 15 cc of blood from the right knee.  I then applied a compression wrap.  He will follow-up with Dr. Magnus Ivan or Rexene Edison as needed.  Call with concerns or questions.  Follow-Up Instructions: Return if symptoms worsen or fail to improve.   Orders:  Orders Placed This Encounter  Procedures   Large Joint Inj: R knee   No orders of the defined types were placed in this encounter.     Procedures: Large Joint Inj: R knee on 06/07/2023 9:28 AM Indications: pain Details: 22 G needle, anterolateral approach Medications: 2 mL lidocaine 1 %; 2 mL bupivacaine 0.25 %      Clinical Data: No additional findings.   Subjective: Chief Complaint  Patient presents with   Right Knee - Pain    HPI patient is a 78 year old gentleman who is a patient of Dr. Eliberto Ivory who comes in today with right knee swelling.  He has a history of medial compartment replacement with underlying advanced lateral and patellofemoral degenerative changes seen on recent MRI.  He is on Coumadin with a recent INR of 3.9.  He has been getting frequent right knee aspirations and injections by Dr. Magnus Ivan and Delane Ginger.  His last aspiration was on 05/30/2023.  His last cortisone injection was on 05/02/2023.  He tells me that he is trying to get his heart straight before proceeding with revision surgery.  Today, he is having pain to the posterior lateral knee.  Symptoms are constant but worse with walking.  He has been taking Tylenol without relief.  No fevers or chills or any other constitutional symptoms.   No history of gout.  No injury.  Review of Systems as detailed in HPI.  All others reviewed and are negative.   Objective: Vital Signs: There were no vitals taken for this visit.  Physical Exam well-developed well-nourished gentleman in no acute distress.  Alert and oriented x 3.  Ortho Exam right knee exam: Small to moderate effusion.  Range of motion 0 to 95 degrees.  Lateral joint line tenderness.  He is neurovascularly intact distally.  Specialty Comments:  No specialty comments available.  Imaging: No new imaging   PMFS History: Patient Active Problem List   Diagnosis Date Noted   Hyperlipidemia associated with type 2 diabetes mellitus (HCC) 02/05/2023   Long term (current) use of anticoagulants 12/25/2022   Hypercoagulable state due to persistent atrial fibrillation (HCC) 12/19/2022   Atrial fibrillation, unspecified type (HCC) 12/12/2022   Encounter for therapeutic drug monitoring 12/12/2022   Acute mucoid otitis media of left ear 06/27/2022   Gastrocnemius muscle tear, left, subsequent encounter 06/27/2022   Allergic sinusitis 02/24/2022   Asthmatic bronchitis , chronic (HCC) 01/24/2022   Primary insomnia 10/06/2021   Gastroesophageal reflux disease 05/18/2021   Type 2 diabetes mellitus with other specified complication (HCC) 05/18/2021   Long term (current) use of insulin (HCC) 05/18/2021   Obesity 05/18/2021  Elevated transaminase level 05/18/2021   Tear of left rotator cuff 09/24/2017   DDD (degenerative disc disease), cervical 10/31/2016   Primary osteoarthritis of both hands 10/31/2016   Primary osteoarthritis of both feet 10/31/2016   High risk medication use 07/06/2016   Rheumatoid arthritis involving multiple sites with positive rheumatoid factor (HCC) 06/02/2016   Contracture, elbow, left 06/02/2016   Osteoarthritis of right knee 08/27/2015   S/P TKR (total knee replacement), left 08/27/2015   Essential hypertension 05/11/2015   CAD (coronary artery  disease) 04/02/2012   Sinus bradycardia    Hyperlipidemia    Past Medical History:  Diagnosis Date   A-fib (HCC)    AKI (acute kidney injury) (HCC) 05/18/2021   Anemia    Arthritis    "all over"   Bradycardia    Chronic bronchitis (HCC)    "get it just about q yr" (03/17/2014)   Daily headache    "here lately" (03/17/2014); relates to sinuses   GERD (gastroesophageal reflux disease)    Hard of hearing    hearing aides bilat   Hiatal hernia    History of blood transfusion 2008   "related to OR"   History of kidney stones    Hyperlipidemia    Pneumonia 1972 X 1   Rheumatoid arthritis (HCC)    Type II diabetes mellitus (HCC)    Wears glasses     Family History  Problem Relation Age of Onset   Cancer Father        prostate    Past Surgical History:  Procedure Laterality Date   ATRIAL FIBRILLATION ABLATION N/A 05/23/2023   Procedure: ATRIAL FIBRILLATION ABLATION;  Surgeon: Maurice Small, MD;  Location: [facility];  Service: Cardiovascular;  Laterality: N/A;   broken finger      surgical repaired left hand 2nd finger    CARDIOVERSION N/A 11/08/2022   Procedure: CARDIOVERSION;  Surgeon: Maisie Fus, MD;  Location: [facility] CV [facility];  Service: Cardiovascular;  Laterality: N/A;   cyst removed      per left knee/posteriorly   CYSTECTOMY Left 2009   "behind knee"   INGUINAL HERNIA REPAIR Right ?2010   JOINT REPLACEMENT     KNEE ARTHROSCOPY Bilateral 1980's   PARTIAL KNEE ARTHROPLASTY Right 08/27/2015   Procedure: RIGHT UNICOMPARTMENTAL KNEE ARTHROPLASTY;  Surgeon: Kathryne Hitch, MD;  Location: [facility];  Service: Orthopedics;  Laterality: Right;   REVISION TOTAL KNEE ARTHROPLASTY Left 2008   SHOULDER SURGERY Left 2019   TOTAL KNEE ARTHROPLASTY Left 2003   Social History   Occupational History    Employer: UNEMPLOYED  Tobacco Use   Smoking status: Former    Current packs/day: 0.00    Average packs/day: 1 pack/day for 5.0 years (5.0 ttl pk-yrs)     Types: Cigarettes, Cigars    Start date: 05/16/1963    Quit date: 05/15/1968    Years since quitting: 55.0    Passive exposure: Never   Smokeless tobacco: Never  Vaping Use   Vaping status: Never Used  Substance and Sexual Activity   Alcohol use: No    Alcohol/week: 0.0 standard drinks of alcohol   Drug use: Never   Sexual activity: Yes

## 2023-06-07 NOTE — Telephone Encounter (Signed)
Called patient advised of below they verbalized understanding.

## 2023-06-11 ENCOUNTER — Other Ambulatory Visit: Payer: Self-pay | Admitting: General Practice

## 2023-06-11 DIAGNOSIS — Z5181 Encounter for therapeutic drug level monitoring: Secondary | ICD-10-CM

## 2023-06-11 DIAGNOSIS — I4891 Unspecified atrial fibrillation: Secondary | ICD-10-CM

## 2023-06-11 NOTE — Telephone Encounter (Signed)
Refill request for warfarin:  Last INR was 3.9 on 06/01/23 Next INR due 06/22/23 LOV was 06/05/23  Refill approved.

## 2023-06-20 ENCOUNTER — Ambulatory Visit (HOSPITAL_COMMUNITY)
Admission: RE | Admit: 2023-06-20 | Discharge: 2023-06-20 | Disposition: A | Discharge status: Home / Self Care | Payer: Medicare Other | Source: Ambulatory Visit | Attending: Internal Medicine | Admitting: Internal Medicine

## 2023-06-20 ENCOUNTER — Encounter (HOSPITAL_COMMUNITY): Payer: Self-pay | Admitting: Internal Medicine

## 2023-06-20 VITALS — BP 130/68 | HR 49 | Ht 66.0 in | Wt 209.0 lb

## 2023-06-20 DIAGNOSIS — Z7901 Long term (current) use of anticoagulants: Secondary | ICD-10-CM | POA: Insufficient documentation

## 2023-06-20 DIAGNOSIS — I4819 Other persistent atrial fibrillation: Secondary | ICD-10-CM | POA: Diagnosis present

## 2023-06-20 DIAGNOSIS — I2510 Atherosclerotic heart disease of native coronary artery without angina pectoris: Secondary | ICD-10-CM | POA: Diagnosis not present

## 2023-06-20 DIAGNOSIS — I700 Atherosclerosis of aorta: Secondary | ICD-10-CM | POA: Insufficient documentation

## 2023-06-20 DIAGNOSIS — D6869 Other thrombophilia: Secondary | ICD-10-CM | POA: Insufficient documentation

## 2023-06-20 DIAGNOSIS — I10 Essential (primary) hypertension: Secondary | ICD-10-CM | POA: Insufficient documentation

## 2023-06-20 NOTE — Progress Notes (Signed)
 Primary Care Physician: [name], MD Primary Cardiologist: Redell Shallow, MD Electrophysiologist: Eulas FORBES Furbish, MD  Referring Physician: Josefa Beauvais NP   Joel BROCKS Rodman Sr. is a 78 y.o. male with a history of DM, HLD, RA, CAD, HTN, atrial fibrillation who presents for consultation in the [facility]. The patient was initially diagnosed with atrial fibrillation 09/22/22 at follow-up with Josefa Beauvais. Patient had noticed some increased fatigue which he attributed to age. His EKG showed new atrial fibrillation rate controlled at 77 bpm. He was started on Eliquis  and underwent DCCV on 11/08/22. Unfortunately, he was back in afib at his follow up on 12/12/22. Patient has a CHADS2VASC score of 5. He was transitioned from Eliquis  to warfarin due to cost.   Today, patient remains in rate controlled afib with symptoms of fatigue and SOB with exertion. He did feel better for about one week post DCCV. No bleeding issues on anticoagulation.   On follow up 06/20/23, patient is currently in NSR. S/p Afib ablation on 05/23/23 by Dr. Furbish. No episodes of Afib since ablation. He is slowly getting back his energy. No chest pain or SOB. Leg sites healed without issue. He is on coumadin .   Today, he denies symptoms of orthopnea, PND, lower extremity edema, dizziness, presyncope, syncope, snoring, daytime somnolence, bleeding, or neurologic sequela. The patient is tolerating medications without difficulties and is otherwise without complaint today.    Atrial Fibrillation Risk Factors:  he does not have symptoms or diagnosis of sleep apnea. he does not have a history of rheumatic fever.   Atrial Fibrillation Management history:  Previous antiarrhythmic drugs: none Previous cardioversions: 11/08/22 Previous ablations: 05/23/23 Anticoagulation history: Eliquis , warfarin   ROS- All systems are reviewed and negative except as per the HPI above.   Physical Exam: BP 130/68    Pulse (!) 49   Ht 5' 6 (1.676 m)   Wt 94.8 kg   BMI 33.73 kg/m   GEN- The patient is well appearing, alert and oriented x 3 today.   Neck - no JVD or carotid bruit noted Lungs- Clear to ausculation bilaterally, normal work of breathing Heart- Regular bradycardic rate and rhythm, no murmurs, rubs or gallops, PMI not laterally displaced Extremities- no clubbing, cyanosis, or edema Skin - no rash or ecchymosis noted   Wt Readings from Last 3 Encounters:  06/20/23 94.8 kg  06/05/23 94.3 kg  06/01/23 93.4 kg     EKG today demonstrates  Vent. rate 49 BPM PR interval 160 ms QRS duration 92 ms QT/QTcB 442/399 ms P-R-T axes 83 33 1 Sinus bradycardia Otherwise normal ECG When compared with ECG of 29-May-2023 16:46, PREVIOUS ECG IS PRESENT  Echo 11/27/22 demonstrated   1. Left ventricular ejection fraction, by estimation, is 60 to 65%. The  left ventricle has normal function. The left ventricle has no regional  wall motion abnormalities. There is mild concentric left ventricular  hypertrophy. Left ventricular diastolic function could not be evaluated.   2. Right ventricular systolic function is normal. The right ventricular  size is normal.   3. Left atrial size was mildly dilated.   4. Right atrial size was mildly dilated.   5. The mitral valve is normal in structure. Trivial mitral valve  regurgitation. No evidence of mitral stenosis.   6. Tricuspid valve regurgitation is moderate to severe.   7. The aortic valve is normal in structure. There is mild calcification  of the aortic valve. There is mild thickening  of the aortic valve. Aortic  valve regurgitation is not visualized. No aortic stenosis is present.   8. The inferior vena cava is normal in size with greater than 50%  respiratory variability, suggesting right atrial pressure of 3 mmHg.    CHA2DS2-VASc Score = 5  The patient's score is based upon: CHF History: 0 HTN History: 1 Diabetes History: 1 Stroke History:  0 Vascular Disease History: 1 (CAD/aortic atherosclerosis) Age Score: 2 Gender Score: 0      ASSESSMENT AND PLAN: Persistent Atrial Fibrillation (ICD10:  I48.19) The patient's CHA2DS2-VASc score is 5, indicating a 7.2% annual risk of stroke.   S/p DCCV 11/08/22 with early return of afib. S/p Afib ablation on 05/23/23 by Dr. Nancey.   He is in NSR. He is doing well overall.     Secondary Hypercoagulable State (ICD10:  D68.69) The patient is at significant risk for stroke/thromboembolism based upon his CHA2DS2-VASc Score of 5.  Continue Warfarin (Coumadin ).  He is on coumadin .   CAD/aortic atherosclerosis No anginal symptoms.   HTN Stable today no changes made to regimen.   Follow up as scheduled with EP.    Dorn Heinrich, PA-C Afib Clinic [facility] [address] [place], [place] [address] [phone]

## 2023-06-22 ENCOUNTER — Ambulatory Visit: Payer: Medicare Other | Attending: General Practice

## 2023-06-22 DIAGNOSIS — I4891 Unspecified atrial fibrillation: Secondary | ICD-10-CM

## 2023-06-22 DIAGNOSIS — Z5181 Encounter for therapeutic drug level monitoring: Secondary | ICD-10-CM

## 2023-06-22 LAB — POCT INR: INR: 2.8 (ref 2.0–3.0)

## 2023-06-22 NOTE — Patient Instructions (Addendum)
 Description   Continue taking warfarin 1 tablet daily except for 1/2 tablet on Fridays.  Stay consistent with greens each week (1 per week).  Recheck INR in 4 weeks Coumadin  Clinic [phone] Cardiac Clearance Fax# [phone]

## 2023-06-26 ENCOUNTER — Telehealth: Payer: Self-pay

## 2023-06-26 NOTE — Telephone Encounter (Signed)
-----   Message from Olene Floss sent at 06/26/2023  8:53 AM EST ----- Regarding: RE: Warfarin to Eliquis It looks like he has a 255 deductible. So his first fill would be 302, but then it would be $47/month after that for the rest of the year ----- Message ----- From: Beverely Low, RN Sent: 06/26/2023   8:44 AM EST To: Olene Floss, RPH-CPP Subject: RE: Warfarin to Eliquis                        Thanks Melissa! Do you know if this would be affordable for him? He was switched to Warfarin in the past d/t cost. ----- Message ----- From: Olene Floss, RPH-CPP Sent: 06/26/2023   8:41 AM EST To: Beverely Low, RN Subject: RE: Warfarin to Eliquis                        Yes, absolutely. His dose would be 5mg  BID ----- Message ----- From: Beverely Low, RN Sent: 06/25/2023  12:25 PM EST To: C[facility] Pharmd Subject: Warfarin to Eliquis                            Hi,  I saw this pt in the Coumadin Clinic on Friday and he was asking if he could switch to Eliquis. I made him aware I would reach out to you and see if this was an option and determine the cost.   Thank you,  Cammy Copa

## 2023-06-26 NOTE — Telephone Encounter (Signed)
Per Malena Peer, PharmD, pt can switch to Eliquis and it should be affordable after he meets his deductible of $255. I called and made pt aware of the cost and ability to switch. Pt states he will discuss with his wife and let Dr Jens [name] and Coumadin Clinic know his decision at upcoming appt on 07/16/23.

## 2023-06-27 ENCOUNTER — Ambulatory Visit (HOSPITAL_COMMUNITY)
Admission: RE | Admit: 2023-06-27 | Discharge: 2023-06-27 | Disposition: A | Discharge status: Home / Self Care | Payer: Medicare Other | Source: Ambulatory Visit | Attending: Rheumatology | Admitting: Rheumatology

## 2023-06-27 ENCOUNTER — Other Ambulatory Visit: Payer: Self-pay | Admitting: Cardiovascular Disease

## 2023-06-27 NOTE — Progress Notes (Signed)
Pt here for orencia.  Stated he has had a sinus infection for a month, was given antibiotics but had not started them yet.  Stated he only has sinus pressure, no cough or fever or sore throat.  Spoke with Dr Corliss Skains and [facility] who both said to reschedule his orencia.  Have the patient start the antibiotics and reschedule the orencia for 2 weeks after the antibiotic is finished.  Wrote this down on a sticky note for patient and gave it to him as well as explained it to him.  Pt verbalized understanding.

## 2023-06-28 ENCOUNTER — Telehealth: Payer: Self-pay | Admitting: *Deleted

## 2023-06-28 NOTE — Telephone Encounter (Signed)
Dr. Corliss Skains advised nurse at [facility] that patient needs clearance from PCP however if he received from ED and his symptoms have FULLY resolved then he can call [facility] to reschedule.  Our concern was that patient had lingering symptoms of infection which would delay his recovery. Once Dub Amis is in system, it cannot be removed so it's very important that he not have any lingering symptoms of infection  Chesley Mires, PharmD, MPH, BCPS, CPP Clinical Pharmacist (Rheumatology and Pulmonology)

## 2023-06-28 NOTE — Telephone Encounter (Signed)
I spoke to patient's wife, [name], who verbalized understanding.

## 2023-06-28 NOTE — Telephone Encounter (Signed)
Toniann Fail, patient's daughter-in-law called, patient is in severe pain, patient cannot open his hands/fingers due to swelling, patient infusion was cancelled 2/12/205 due to RX for antibiotics that had been prescribed to patient, patient picked up antibiotics but did not take any of them and is not going to take them, patient would like to have infusion ASAP. Toniann Fail is not on patient's HIPAA-no information was given to her. Please advise and call patient as soon as infusion can be scheduled. Thank you.  Patient was prescribed antibiotics for a sinus infection at ER visit x 1 month, CT scan.

## 2023-07-03 ENCOUNTER — Ambulatory Visit (HOSPITAL_COMMUNITY)
Admission: RE | Admit: 2023-07-03 | Discharge: 2023-07-03 | Disposition: A | Discharge status: Home / Self Care | Payer: Medicare Other | Source: Ambulatory Visit | Attending: Rheumatology | Admitting: Rheumatology

## 2023-07-03 ENCOUNTER — Telehealth: Payer: Self-pay | Admitting: *Deleted

## 2023-07-03 DIAGNOSIS — Z79899 Other long term (current) drug therapy: Secondary | ICD-10-CM | POA: Insufficient documentation

## 2023-07-03 DIAGNOSIS — M0579 Rheumatoid arthritis with rheumatoid factor of multiple sites without organ or systems involvement: Secondary | ICD-10-CM | POA: Diagnosis present

## 2023-07-03 DIAGNOSIS — Z111 Encounter for screening for respiratory tuberculosis: Secondary | ICD-10-CM | POA: Insufficient documentation

## 2023-07-03 DIAGNOSIS — Z5181 Encounter for therapeutic drug level monitoring: Secondary | ICD-10-CM | POA: Insufficient documentation

## 2023-07-03 LAB — COMPREHENSIVE METABOLIC PANEL
ALT: 103 U/L — ABNORMAL HIGH (ref 0–44)
AST: 52 U/L — ABNORMAL HIGH (ref 15–41)
Albumin: 3.6 g/dL (ref 3.5–5.0)
Alkaline Phosphatase: 153 U/L — ABNORMAL HIGH (ref 38–126)
Anion gap: 14 (ref 5–15)
BUN: 18 mg/dL (ref 8–23)
CO2: 25 mmol/L (ref 22–32)
Calcium: 9.5 mg/dL (ref 8.9–10.3)
Chloride: 99 mmol/L (ref 98–111)
Creatinine, Ser: 1.1 mg/dL (ref 0.61–1.24)
GFR, Estimated: >60 mL/min (ref >60)
Glucose, Bld: 212 mg/dL — ABNORMAL HIGH (ref 70–99)
Potassium: 3.6 mmol/L (ref 3.5–5.1)
Sodium: 138 mmol/L (ref 135–145)
Total Bilirubin: 0.9 mg/dL (ref 0.0–1.2)
Total Protein: 6.4 g/dL — ABNORMAL LOW (ref 6.5–8.1)

## 2023-07-03 LAB — CBC WITH DIFFERENTIAL/PLATELET
Abs Immature Granulocytes: 0.01 10*3/uL (ref 0.00–0.07)
Basophils Absolute: 0.1 10*3/uL (ref 0.0–0.1)
Basophils Relative: 1 %
Eosinophils Absolute: 0.2 10*3/uL (ref 0.0–0.5)
Eosinophils Relative: 4 %
HCT: 38 % — ABNORMAL LOW (ref 39.0–52.0)
Hemoglobin: 12.9 g/dL — ABNORMAL LOW (ref 13.0–17.0)
Immature Granulocytes: 0 %
Lymphocytes Relative: 35 %
Lymphs Abs: 1.9 10*3/uL (ref 0.7–4.0)
MCH: 31.1 pg (ref 26.0–34.0)
MCHC: 33.9 g/dL (ref 30.0–36.0)
MCV: 91.6 fL (ref 80.0–100.0)
Monocytes Absolute: 0.6 10*3/uL (ref 0.1–1.0)
Monocytes Relative: 12 %
Neutro Abs: 2.6 10*3/uL (ref 1.7–7.7)
Neutrophils Relative %: 48 %
Platelets: 228 10*3/uL (ref 150–400)
RBC: 4.15 MIL/uL — ABNORMAL LOW (ref 4.22–5.81)
RDW: 13.3 % (ref 11.5–15.5)
WBC: 5.4 10*3/uL (ref 4.0–10.5)
nRBC: 0 % (ref 0.0–0.2)

## 2023-07-03 LAB — LIPID PANEL
Cholesterol: 120 mg/dL (ref 0–200)
HDL: 34 mg/dL — ABNORMAL LOW (ref >40)
LDL Cholesterol: 65 mg/dL (ref 0–99)
Total CHOL/HDL Ratio: 3.5 {ratio}
Triglycerides: 103 mg/dL (ref <150)
VLDL: 21 mg/dL (ref 0–40)

## 2023-07-03 MED ORDER — SODIUM CHLORIDE 0.9 % IV SOLN
750.0000 mg | INTRAVENOUS | Status: DC
  Administered 2023-07-03: 750 mg via INTRAVENOUS
  Filled 2023-07-03: qty 30

## 2023-07-03 MED ORDER — DIPHENHYDRAMINE HCL 25 MG PO CAPS: 25.0000 mg | ORAL_CAPSULE | ORAL | Status: DC

## 2023-07-03 MED ORDER — ACETAMINOPHEN 325 MG PO TABS: 650.0000 mg | ORAL_TABLET | ORAL | Status: DC

## 2023-07-03 NOTE — Progress Notes (Signed)
Hemoglobin is low and stable.  Glucose is elevated at 212.  Liver functions are elevated.  Most likely due to the use of Augmentin.  Patient should have repeat LFTs in 3 weeks.  He should avoid NSAIDs and alcohol use.  Please forward results to his PCP.

## 2023-07-03 NOTE — Progress Notes (Signed)
HDL is low-34. Rest of lipid panel WNL.

## 2023-07-03 NOTE — Telephone Encounter (Signed)
-----   Message from [facility] sent at 07/03/2023 10:43 AM EST ----- Hemoglobin is low and stable.  Glucose is elevated at 212.  Liver functions are elevated.  Most likely due to the use of Augmentin.  Patient should have repeat LFTs in 3 weeks.  He should avoid NSAIDs and alcohol use.  Please forward results to his P CP.

## 2023-07-03 NOTE — Progress Notes (Signed)
 HPI: FU CAD and PAF. Cardiac catheterization in 2004 showed nonobstructive disease. Abdominal ultrasound showed no aneurysm and no acute hepatobiliary findings. Carotid Dopplers October 2017 showed minimal amount of plaque bilaterally.  Echocardiogram July 2024 showed normal LV function, mild left ventricular hypertrophy, mild biatrial enlargement, moderate to severe tricuspid regurgitation.  Nuclear study November 2024 showed ejection fraction 52%, apical thinning but no ischemia.  CTA prior to atrial fibrillation ablation December 2024 showed calcium score 327 which was 54th percentile.  Had atrial fibrillation ablation May 23, 2023.  Since last seen he denies dyspnea on exertion, orthopnea, PND, pedal edema, exertional chest pain or syncope.  He is having difficulties with pain in his knee and will require knee replacement.  Current Outpatient Medications  Medication Sig Dispense Refill   Abatacept (ORENCIA IV) Inject 750 mg into the vein every 28 (twenty-eight) days.     acetaminophen (TYLENOL) 500 MG tablet Take 500-1,000 mg by mouth every 6 (six) hours as needed (pain.).     amLODipine (NORVASC) 5 MG tablet TAKE 1 TABLET EVERY DAY 90 tablet 3   amoxicillin-clavulanate (AUGMENTIN) 875-125 MG tablet Take 1 tablet by mouth 2 (two) times daily. 14 tablet 0   Ascorbic Acid (VITAMIN C PO) Take 1,000 mg by mouth in the morning.     Calcium Carbonate-Vit D-Min (CALCIUM 1200 PO) Take 1 tablet by mouth every evening.     Cholecalciferol (VITAMIN D3 PO) Take 1,000 Units by mouth in the morning.     fexofenadine (ALLEGRA) 180 MG tablet Take 180 mg by mouth in the morning.     Insulin Aspart, w/Niacinamide, (FIASP) 100 UNIT/ML SOLN 100 Units. Insulin pump     insulin glargine (LANTUS) 100 UNIT/ML injection Inject 16 Units into the skin as needed (If pump goes out).     Insulin Human (INSULIN PUMP) SOLN Inject into the skin continuous. Fiasp 77 units     Multiple Vitamin (MULTIVITAMIN WITH  MINERALS) TABS Take 1 tablet by mouth in the morning. Centrum     ONE TOUCH ULTRA TEST test strip 1 each daily. Pt says 2-3 times daily  3   pantoprazole (PROTONIX) 40 MG tablet TAKE 1 TABLET BY MOUTH DAILY 90 tablet 3   Potassium 99 MG TABS Take 99 mg by mouth in the morning.     pravastatin (PRAVACHOL) 40 MG tablet TAKE 1 TABLET EVERY EVENING 90 tablet 3   pyridoxine (B-6) 500 MG tablet Take 500 mg by mouth in the morning.     ramipril (ALTACE) 5 MG capsule Take 1 capsule (5 mg total) by mouth daily. 90 capsule 3   vitamin E 400 UNIT capsule Take 400 Units by mouth in the morning.     warfarin (COUMADIN) 5 MG tablet TAKE 1 TABLET BY MOUTH DAILY OR AS DIRECTED BY ANTICOAGULATION CLINIC 30 tablet 3   zinc gluconate 50 MG tablet Take 50 mg by mouth in the morning.     No current facility-administered medications for this visit.     Past Medical History:  Diagnosis Date   A-fib [facility])    AKI (acute kidney injury) (HCC) 05/18/2021   Anemia    Arthritis    "all over"   Bradycardia    Chronic bronchitis (HCC)    "get it just about q yr" (03/17/2014)   Daily headache    "here lately" (03/17/2014); relates to sinuses   GERD (gastroesophageal reflux disease)    Hard of hearing    hearing aides bilat  Hiatal hernia    History of blood transfusion 2008   "related to OR"   History of kidney stones    Hyperlipidemia    Pneumonia 1972 X 1   Rheumatoid arthritis (HCC)    Type II diabetes mellitus (HCC)    Wears glasses     Past Surgical History:  Procedure Laterality Date   ATRIAL FIBRILLATION ABLATION N/A 05/23/2023   Procedure: ATRIAL FIBRILLATION ABLATION;  Surgeon: Maurice Small, MD;  Location: [facility] INVASIVE CV LAB;  Service: Cardiovascular;  Laterality: N/A;   broken finger      surgical repaired left hand 2nd finger    CARDIOVERSION N/A 11/08/2022   Procedure: CARDIOVERSION;  Surgeon: Maisie Fus, MD;  Location: [facility] INVASIVE CV LAB;  Service: Cardiovascular;  Laterality: N/A;    cyst removed      per left knee/posteriorly   CYSTECTOMY Left 2009   "behind knee"   INGUINAL HERNIA REPAIR Right ?2010   JOINT REPLACEMENT     KNEE ARTHROSCOPY Bilateral 1980's   PARTIAL KNEE ARTHROPLASTY Right 08/27/2015   Procedure: RIGHT UNICOMPARTMENTAL KNEE ARTHROPLASTY;  Surgeon: Kathryne Hitch, MD;  Location: [facility] ORS;  Service: Orthopedics;  Laterality: Right;   REVISION TOTAL KNEE ARTHROPLASTY Left 2008   SHOULDER SURGERY Left 2019   TOTAL KNEE ARTHROPLASTY Left 2003    Social History   Socioeconomic History   Marital status: Married    Spouse name: Genevie Cheshire   Number of children: 3   Years of education: College   Highest education level: Not on file  Occupational History    Employer: UNEMPLOYED  Tobacco Use   Smoking status: Former    Current packs/day: 0.00    Average packs/day: 1 pack/day for 5.0 years (5.0 ttl pk-yrs)    Types: Cigarettes, Cigars    Start date: 05/16/1963    Quit date: 05/15/1968    Years since quitting: 55.2    Passive exposure: Never   Smokeless tobacco: Never   Tobacco comments:    Former smoker 06/20/23  Vaping Use   Vaping status: Never Used  Substance and Sexual Activity   Alcohol use: No    Alcohol/week: 0.0 standard drinks of alcohol   Drug use: Never   Sexual activity: Yes  Other Topics Concern   Not on file  Social History Narrative   Patient lives at home with family.   Caffeine Use: occasionally   Social Drivers of Corporate investment banker Strain: Not on file  Food Insecurity: Not on file  Transportation Needs: Not on file  Physical Activity: Not on file  Stress: Not on file  Social Connections: Not on file  Intimate Partner Violence: Not on file    Family History  Problem Relation Age of Onset   Cancer Father        prostate    ROS: Knee pain but no fevers or chills, productive cough, hemoptysis, dysphasia, odynophagia, melena, hematochezia, dysuria, hematuria, rash, seizure activity, orthopnea, PND,  pedal edema, claudication. Remaining systems are negative.  Physical Exam: Well-developed well-nourished in no acute distress.  Skin is warm and dry.  HEENT is normal.  Neck is supple.  Chest is clear to auscultation with normal expansion.  Cardiovascular exam is regular rate and rhythm.  Abdominal exam nontender or distended. No masses palpated. Extremities show no edema. neuro grossly intact   A/P  1 paroxysmal atrial fibrillation-patient is status post ablation and remains in sinus rhythm on examination.  Continue Coumadin with goal INR 2-3.  Note he would like to change to Eliquis and we will make those arrangements.  His dose will be 5 mg twice daily.  2 coronary calcification/mild coronary disease-he denies chest pain and recent nuclear study showed no ischemia.  Continue medical therapy.  Continue statin.  Note patient's liver functions were noted to be elevated recently.  Will repeat today.  Speyer need to hold statin if they remain elevated.  3 history of bradycardia-we will continue to avoid AV nodal blocking agents.  4 hypertension-patient's blood pressure is controlled.  Continue present medications.  5 hyperlipidemia-continue statin.  6 tricuspid regurgitation-moderate to severe on last echocardiogram.  Will repeat study 7/25.  7 preoperative evaluation prior to knee replacement-he is not having exertional chest pain.  He [place] proceed without further cardiac evaluation.  Hold apixaban 3 days prior to procedure and resume after when okay with orthopedics.  Olga Millers, MD

## 2023-07-09 ENCOUNTER — Encounter: Payer: Self-pay | Admitting: Physician Assistant

## 2023-07-09 ENCOUNTER — Ambulatory Visit: Payer: Medicare Other | Admitting: Physician Assistant

## 2023-07-09 DIAGNOSIS — M1711 Unilateral primary osteoarthritis, right knee: Secondary | ICD-10-CM | POA: Diagnosis not present

## 2023-07-09 DIAGNOSIS — M25461 Effusion, right knee: Secondary | ICD-10-CM

## 2023-07-09 NOTE — Progress Notes (Signed)
 HPI: Joel Jones comes in today for aspiration of his right knee.  Still remains on Coumadin.  He has had no new injury to the knee.  Reports discomfort due to the swelling of the knee.  Physical exam: General: Well-developed well-nourished male no acute distress. Right knee positive effusion no abnormal warmth erythema.  Has full extension and flexion beyond 90 degrees.  Impression: Right knee effusion Right knee osteoarthritis  Plan: Right knee aspirated 30 cc of serosanguineous fluid.  Patient tolerated this well.  Will have him follow-up as needed.  Questions were encouraged and answered at length.

## 2023-07-16 ENCOUNTER — Encounter: Payer: Self-pay | Admitting: Orthopaedic Surgery

## 2023-07-16 ENCOUNTER — Encounter: Payer: Self-pay | Admitting: Cardiology

## 2023-07-16 ENCOUNTER — Ambulatory Visit (INDEPENDENT_AMBULATORY_CARE_PROVIDER_SITE_OTHER): Payer: Medicare Other | Admitting: *Deleted

## 2023-07-16 ENCOUNTER — Ambulatory Visit: Payer: Medicare Other | Attending: Cardiology | Admitting: Cardiology

## 2023-07-16 VITALS — BP 136/72 | HR 56 | Ht 66.0 in | Wt 212.2 lb

## 2023-07-16 DIAGNOSIS — I10 Essential (primary) hypertension: Secondary | ICD-10-CM | POA: Diagnosis not present

## 2023-07-16 DIAGNOSIS — Z5181 Encounter for therapeutic drug level monitoring: Secondary | ICD-10-CM | POA: Diagnosis not present

## 2023-07-16 DIAGNOSIS — I079 Rheumatic tricuspid valve disease, unspecified: Secondary | ICD-10-CM

## 2023-07-16 DIAGNOSIS — Z01810 Encounter for preprocedural cardiovascular examination: Secondary | ICD-10-CM

## 2023-07-16 DIAGNOSIS — Z7901 Long term (current) use of anticoagulants: Secondary | ICD-10-CM

## 2023-07-16 DIAGNOSIS — I4891 Unspecified atrial fibrillation: Secondary | ICD-10-CM

## 2023-07-16 DIAGNOSIS — R001 Bradycardia, unspecified: Secondary | ICD-10-CM

## 2023-07-16 DIAGNOSIS — E782 Mixed hyperlipidemia: Secondary | ICD-10-CM

## 2023-07-16 DIAGNOSIS — I4819 Other persistent atrial fibrillation: Secondary | ICD-10-CM

## 2023-07-16 LAB — POCT INR: INR: 3.1 — AB (ref 2.0–3.0)

## 2023-07-16 LAB — HEPATIC FUNCTION PANEL
ALT: 230 IU/L — ABNORMAL HIGH (ref 0–44)
AST: 277 IU/L — ABNORMAL HIGH (ref 0–40)
Albumin: 4.3 g/dL (ref 3.8–4.8)
Alkaline Phosphatase: 238 IU/L — ABNORMAL HIGH (ref 44–121)
Bilirubin Total: 0.8 mg/dL (ref 0.0–1.2)
Bilirubin, Direct: 0.33 mg/dL (ref 0.00–0.40)
Total Protein: 6.4 g/dL (ref 6.0–8.5)

## 2023-07-16 MED ORDER — APIXABAN 5 MG PO TABS: 5.0000 mg | ORAL_TABLET | Freq: Two times a day (BID) | ORAL | 6 refills | Status: DC

## 2023-07-16 NOTE — Patient Instructions (Signed)
 Description   WHEN YOU ARE READY TO SWITCH TO ELIQUIS CALL us AT [phone] Today take 1/2 tablet of warfarin then continue taking warfarin 1 tablet daily except for 1/2 tablet on Fridays. Stay consistent with greens each week (1 per week).  Recheck INR in 2 weeks-when ready to switch to eliquis. Coumadin Clinic [phone]  Cardiac Clearance Fax# [phone]     Cardiac Clearance Fax# [phone]

## 2023-07-16 NOTE — Patient Instructions (Signed)
 Medication Instructions:   STOP WARFARIN  START ELIQUIS AS DIRECTED BY CVRR  *If you need a refill on your cardiac medications before your next appointment, please call your pharmacy*   Lab Work:  Your physician recommends that you HAVE LAB WORK TODAY  If you have labs (blood work) drawn today and your tests are completely normal, you will receive your results only by: MyChart Message (if you have MyChart) OR A paper copy in the mail If you have any lab test that is abnormal or we need to change your treatment, we will call you to review the results.   Testing/Procedures:  Your physician has requested that you have an echocardiogram. Echocardiography is a painless test that uses sound waves to create images of your heart. It provides your doctor with information about the size and shape of your heart and how well your heart's chambers and valves are working. This procedure takes approximately one hour. There are no restrictions for this procedure. Please do NOT wear cologne, perfume, aftershave, or lotions (deodorant is allowed). Please arrive 15 minutes prior to your appointment time.  Please note: We ask at that you not bring children with you during ultrasound (echo/ vascular) testing. Due to room size and safety concerns, children are not allowed in the ultrasound rooms during exams. Our front office staff cannot provide observation of children in our lobby area while testing is being conducted. An adult accompanying a patient to their appointment will only be allowed in the ultrasound room at the discretion of the ultrasound technician under special circumstances. We apologize for any inconvenience. SCHEDULE IN JULY   Follow-Up: At [facility], you and your health needs are our priority.  As part of our continuing mission to provide you with exceptional heart care, we have created designated Provider Care Teams.  These Care Teams include your primary Cardiologist  (physician) and Advanced Practice Providers (APPs -  Physician Assistants and Nurse Practitioners) who all work together to provide you with the care you need, when you need it.  We recommend signing up for the patient portal called "MyChart".  Sign up information is provided on this After Visit Summary.  MyChart is used to connect with patients for Virtual Visits (Telemedicine).  Patients are able to view lab/test results, encounter notes, upcoming appointments, etc.  Non-urgent messages can be sent to your provider as well.   To learn more about what you can do with MyChart, go to [email or web address].    Your next appointment:   6 month(s)  Provider:   Olga Millers, MD

## 2023-07-17 ENCOUNTER — Telehealth: Payer: Self-pay | Admitting: *Deleted

## 2023-07-17 DIAGNOSIS — R7989 Other specified abnormal findings of blood chemistry: Secondary | ICD-10-CM

## 2023-07-17 NOTE — Telephone Encounter (Signed)
-----   Message from Olga Millers sent at 07/17/2023  7:23 AM EST ----- LFTs significantly elevated; DC pravastatin and tylenol; would ask pt to discuss abatacept with rheumatology; arrange GI evaluation for elevated LFTs Olga Millers

## 2023-07-17 NOTE — Telephone Encounter (Signed)
 Spoke with pt, Aware of dr Ludwig Clarks recommendations. He does reports taking a lot of tylenol recently due to knee issues, cautioned him regarding tylenol use and the liver. Aware will forward these results to Dr Corliss Skains, he reports his next abatacept infusion is march 18th. He was encouraged to follow up with rheumatology prior to infusion if he has not heard from them. Patient agrees to [facility] [place] and that referral has been placed. Results also forwarded to patients PCP for their knowledge.

## 2023-07-17 NOTE — Progress Notes (Signed)
 I reviewed the lab results.  I noticed that patient had Augmentin in January.  I agree with the stopping Tylenol and statins.  We will hold off Orencia until the GI evaluation.  Thank you for the update.

## 2023-07-18 ENCOUNTER — Telehealth (INDEPENDENT_AMBULATORY_CARE_PROVIDER_SITE_OTHER): Admitting: Internal Medicine

## 2023-07-18 ENCOUNTER — Other Ambulatory Visit: Payer: Self-pay | Admitting: Pharmacist

## 2023-07-18 ENCOUNTER — Encounter: Payer: Self-pay | Admitting: Internal Medicine

## 2023-07-18 VITALS — BP 138/72 | HR 64 | Ht 66.0 in | Wt 200.6 lb

## 2023-07-18 DIAGNOSIS — E1169 Type 2 diabetes mellitus with other specified complication: Secondary | ICD-10-CM | POA: Diagnosis not present

## 2023-07-18 DIAGNOSIS — I4891 Unspecified atrial fibrillation: Secondary | ICD-10-CM

## 2023-07-18 DIAGNOSIS — Z794 Long term (current) use of insulin: Secondary | ICD-10-CM

## 2023-07-18 DIAGNOSIS — R7401 Elevation of levels of liver transaminase levels: Secondary | ICD-10-CM | POA: Diagnosis not present

## 2023-07-18 DIAGNOSIS — M1711 Unilateral primary osteoarthritis, right knee: Secondary | ICD-10-CM

## 2023-07-18 DIAGNOSIS — I10 Essential (primary) hypertension: Secondary | ICD-10-CM

## 2023-07-18 DIAGNOSIS — E1159 Type 2 diabetes mellitus with other circulatory complications: Secondary | ICD-10-CM | POA: Diagnosis not present
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
## 2023-07-18 MED ORDER — TRAMADOL HCL 50 MG PO TABS: 50.0000 mg | ORAL_TABLET | Freq: Two times a day (BID) | ORAL | 0 refills | Status: DC | PRN

## 2023-07-18 NOTE — Assessment & Plan Note (Signed)
 CMP reviewed, AST/ALT and ALP sudden elevation --he is currently asymptomatic He does report taking Tylenol multiple tablets in a day for knee pain, which could lead to acute rise in transaminase Held statin by Cardiology, maintain adequate hydration, avoid Tylenol for now Recheck CMP in the next week Check [place] RUQ abdomen Planned to see GI

## 2023-07-18 NOTE — Progress Notes (Unsigned)
 Established Patient Office Visit  Subjective:  Patient ID: Joel Ruelas Brien Sr., male    DOB: [birth year]  Age: 78 y.o. MRN: [id number]  CC:  Chief Complaint  Patient presents with   Care Management    Follow up    Referral    Referral to Gastro     HPI Joel Wolin Ganus Sr. is a 78 y.o. male with past medical history of HTN, type II DM with HLD, RA, OA and obesity who presents for f/u of his chronic medical conditions.  He has had recurrent bronchitis.  He currently complains of chronic cough with clear expectoration.  He denies any hemoptysis currently.  He has had recurrent antibiotics for sinusitis recently.  HTN: BP is well-controlled. Takes medications regularly. Patient denies headache, dizziness, chest pain, dyspnea or palpitations.   Type II DM with HLD: Followed by Dr. Sharl Ma at [facility].  He has an insulin pump. His last HbA1C was 8.3 in 12/24.  He has been taking insulin according to his recommended regimen. He denies any fatigue, polyuria or polydipsia currently.  He is on statin for HLD, but has been held currently due to elevated liver enzymes.    Past Medical History:  Diagnosis Date   A-fib [facility])    AKI (acute kidney injury) (HCC) 05/18/2021   Anemia    Arthritis    "all over"   Bradycardia    Chronic bronchitis (HCC)    "get it just about q yr" (03/17/2014)   Daily headache    "here lately" (03/17/2014); relates to sinuses   GERD (gastroesophageal reflux disease)    Hard of hearing    hearing aides bilat   Hiatal hernia    History of blood transfusion 2008   "related to OR"   History of kidney stones    Hyperlipidemia    Pneumonia 1972 X 1   Rheumatoid arthritis (HCC)    Type II diabetes mellitus (HCC)    Wears glasses     Past Surgical History:  Procedure Laterality Date   ATRIAL FIBRILLATION ABLATION N/A 05/23/2023   Procedure: ATRIAL FIBRILLATION ABLATION;  Surgeon: Maurice Small, MD;  Location: [facility] INVASIVE CV [facility];  Service: Cardiovascular;   Laterality: N/A;   broken finger      surgical repaired left hand 2nd finger    CARDIOVERSION N/A 11/08/2022   Procedure: CARDIOVERSION;  Surgeon: Maisie Fus, MD;  Location: [facility] INVASIVE CV [facility];  Service: Cardiovascular;  Laterality: N/A;   cyst removed      per left knee/posteriorly   CYSTECTOMY Left 2009   "behind knee"   INGUINAL HERNIA REPAIR Right ?2010   JOINT REPLACEMENT     KNEE ARTHROSCOPY Bilateral 1980's   PARTIAL KNEE ARTHROPLASTY Right 08/27/2015   Procedure: RIGHT UNICOMPARTMENTAL KNEE ARTHROPLASTY;  Surgeon: Kathryne Hitch, MD;  Location: [facility] ORS;  Service: Orthopedics;  Laterality: Right;   REVISION TOTAL KNEE ARTHROPLASTY Left 2008   SHOULDER SURGERY Left 2019   TOTAL KNEE ARTHROPLASTY Left 2003    Family History  Problem Relation Age of Onset   Cancer Father        prostate    Social History   Socioeconomic History   Marital status: Married    Spouse name: Genevie Cheshire   Number of children: 3   Years of education: College   Highest education level: Not on file  Occupational History    Employer: UNEMPLOYED  Tobacco Use   Smoking status: Former    Current packs/day:  0.00    Average packs/day: 1 pack/day for 5.0 years (5.0 ttl pk-yrs)    Types: Cigarettes, Cigars    Start date: 05/16/1963    Quit date: 05/15/1968    Years since quitting: 55.2    Passive exposure: Never   Smokeless tobacco: Never   Tobacco comments:    Former smoker 06/20/23  Vaping Use   Vaping status: Never Used  Substance and Sexual Activity   Alcohol use: No    Alcohol/week: 0.0 standard drinks of alcohol   Drug use: Never   Sexual activity: Yes  Other Topics Concern   Not on file  Social History Narrative   Patient lives at home with family.   Caffeine Use: occasionally   Social Drivers of Corporate investment banker Strain: Not on file  Food Insecurity: Not on file  Transportation Needs: Not on file  Physical Activity: Not on file  Stress: Not on file  Social  Connections: Not on file  Intimate Partner Violence: Not on file    Outpatient Medications Prior to Visit  Medication Sig Dispense Refill   Abatacept (ORENCIA IV) Inject 750 mg into the vein every 28 (twenty-eight) days.     amLODipine (NORVASC) 5 MG tablet TAKE 1 TABLET EVERY DAY 90 tablet 3   apixaban (ELIQUIS) 5 MG TABS tablet Take 1 tablet (5 mg total) by mouth 2 (two) times daily. 60 tablet 6   Ascorbic Acid (VITAMIN C PO) Take 1,000 mg by mouth in the morning.     Calcium Carbonate-Vit D-Min (CALCIUM 1200 PO) Take 1 tablet by mouth every evening.     Cholecalciferol (VITAMIN D3 PO) Take 1,000 Units by mouth in the morning.     fexofenadine (ALLEGRA) 180 MG tablet Take 180 mg by mouth in the morning.     Insulin Aspart, w/Niacinamide, (FIASP) 100 UNIT/ML SOLN 100 Units. Insulin pump     insulin glargine (LANTUS) 100 UNIT/ML injection Inject 16 Units into the skin as needed (If pump goes out).     Insulin Human (INSULIN PUMP) SOLN Inject into the skin continuous. Fiasp 77 units     Multiple Vitamin (MULTIVITAMIN WITH MINERALS) TABS Take 1 tablet by mouth in the morning. Centrum     ONE TOUCH ULTRA TEST test strip 1 each daily. Pt says 2-3 times daily  3   pantoprazole (PROTONIX) 40 MG tablet TAKE 1 TABLET BY MOUTH DAILY 90 tablet 3   Potassium 99 MG TABS Take 99 mg by mouth in the morning.     pyridoxine (B-6) 500 MG tablet Take 500 mg by mouth in the morning.     ramipril (ALTACE) 5 MG capsule Take 1 capsule (5 mg total) by mouth daily. 90 capsule 3   vitamin E 400 UNIT capsule Take 400 Units by mouth in the morning.     zinc gluconate 50 MG tablet Take 50 mg by mouth in the morning.     amoxicillin-clavulanate (AUGMENTIN) 875-125 MG tablet Take 1 tablet by mouth 2 (two) times daily. 14 tablet 0   No facility-administered medications prior to visit.    Allergies  Allergen Reactions   Codeine Swelling   Lipitor [Atorvastatin] Other (See Comments)    Muscle aches    Invokana  [Canagliflozin] Other (See Comments)    Stomach upset    Losartan Nausea And Vomiting   Roxicodone [Oxycodone] Itching   Vicodin [Hydrocodone-Acetaminophen] Itching    ROS Review of Systems  Constitutional:  Negative for chills and fever.  HENT:  Negative for congestion and sore throat.   Eyes:  Negative for pain and discharge.  Respiratory:  Positive for cough. Negative for shortness of breath.   Cardiovascular:  Negative for chest pain and palpitations.  Gastrointestinal:  Negative for diarrhea, nausea and vomiting.  Endocrine: Negative for polydipsia and polyuria.  Genitourinary:  Negative for dysuria and hematuria.  Musculoskeletal:  Positive for arthralgias and back pain. Negative for neck pain and neck stiffness.  Skin:  Negative for rash.  Neurological:  Negative for dizziness, weakness, numbness and headaches.  Psychiatric/Behavioral:  Positive for sleep disturbance. Negative for agitation and behavioral problems.       Objective:    Physical Exam Vitals reviewed.  Constitutional:      General: He is not in acute distress.    Appearance: He is obese. He is not diaphoretic.  HENT:     Head: Normocephalic and atraumatic.     Left Ear: No tenderness. A middle ear effusion is present.     Nose: Congestion present.     Mouth/Throat:     Mouth: Mucous membranes are moist.  Eyes:     General: No scleral icterus.    Extraocular Movements: Extraocular movements intact.  Cardiovascular:     Rate and Rhythm: Normal rate and regular rhythm.     Pulses: Normal pulses.     Heart sounds: Normal heart sounds. No murmur heard. Pulmonary:     Breath sounds: Normal breath sounds. No wheezing or rales.  Musculoskeletal:     Cervical back: Neck supple. No tenderness.     Right lower leg: No edema.     Left lower leg: No edema.  Skin:    Comments: Cyst over upper back  Neurological:     General: No focal deficit present.     Mental Status: He is alert and oriented to person,  place, and time.  Psychiatric:        Mood and Affect: Mood normal.        Behavior: Behavior normal.     BP (!) 166/70 (BP Location: Right Arm, Patient Position: Sitting, Cuff Size: Normal)   Pulse 64   Ht 5\' 6"  (1.676 m)   Wt 200 lb 9.6 oz (91 kg)   SpO2 92%   BMI 32.38 kg/m  Wt Readings from Last 3 Encounters:  07/18/23 200 lb 9.6 oz (91 kg)  07/16/23 212 lb 3.2 oz (96.3 kg)  07/03/23 200 lb (90.7 kg)    Lab Results  Component Value Date   TSH 3.03 05/24/2016   Lab Results  Component Value Date   WBC 5.4 07/03/2023   HGB 12.9 (L) 07/03/2023   HCT 38.0 (L) 07/03/2023   MCV 91.6 07/03/2023   PLT 228 07/03/2023   Lab Results  Component Value Date   NA 138 07/03/2023   K 3.6 07/03/2023   CO2 25 07/03/2023   GLUCOSE 212 (H) 07/03/2023   BUN 18 07/03/2023   CREATININE 1.10 07/03/2023   BILITOT 0.8 07/16/2023   ALKPHOS 238 (H) 07/16/2023   AST 277 (H) 07/16/2023   ALT 230 (H) 07/16/2023   PROT 6.4 07/16/2023   ALBUMIN 4.3 07/16/2023   CALCIUM 9.5 07/03/2023   ANIONGAP 14 07/03/2023   EGFR 64 04/24/2023   Lab Results  Component Value Date   CHOL 120 07/03/2023   Lab Results  Component Value Date   HDL 34 (L) 07/03/2023   Lab Results  Component Value Date   LDLCALC 65 07/03/2023  Lab Results  Component Value Date   TRIG 103 07/03/2023   Lab Results  Component Value Date   CHOLHDL 3.5 07/03/2023   Lab Results  Component Value Date   HGBA1C 8.3 05/07/2023      Assessment & Plan:   Problem List Items Addressed This Visit       Endocrine   Type 2 diabetes mellitus with other specified complication (HCC)   Lab Results  Component Value Date   HGBA1C 8.1 (A) 06/27/2022   Uncontrolled Associated with CAD, HTN and HLD Has insulin pump, followed by Dr. Sharl Ma with [facility] - needs to check blood glucose regularly and take insulin as recommended regimen Advised to follow diabetic diet On statin and ACEi F/u CMP and lipid profile Diabetic  eye exam: Advised to follow up with Ophthalmology for diabetic eye exam        Musculoskeletal and Integument   Osteoarthritis of right knee   Relevant Medications   traMADol (ULTRAM) 50 MG tablet     Other   Elevated transaminase level - Primary   CMP reviewed, AST/ALT and ALP sudden elevation --he is currently asymptomatic He does report taking Tylenol multiple tablets in a day for knee pain, which could lead to acute rise in transaminase Could be elevated as acute phase reactant from recent infection as well Held statin by Cardiology, maintain adequate hydration, avoid Tylenol for now Recheck CMP in the next week Planned to see GI      Relevant Orders   CMP14+EGFR   INR/PT   US Abdomen Limited RUQ (LIVER/GB)    Meds ordered this encounter  Medications   traMADol (ULTRAM) 50 MG tablet    Sig: Take 1 tablet (50 mg total) by mouth every 12 (twelve) hours as needed.    Dispense:  20 tablet    Refill:  0    Follow-up: Return in about 6 months (around 01/18/2024).    [name], MD

## 2023-07-18 NOTE — Patient Instructions (Signed)
 Please avoid taking Tylenol for now. Please take Tramadol for severe knee pain.  Please get blood tests done in the next week. You are being scheduled to get [place] of abdomen.

## 2023-07-18 NOTE — Assessment & Plan Note (Signed)
Lab Results  Component Value Date   HGBA1C 8.1 (A) 06/27/2022   Uncontrolled Associated with CAD, HTN and HLD Has insulin pump, followed by Dr. Buddy Duty with [facility] - needs to check blood glucose regularly and take insulin as recommended regimen Advised to follow diabetic diet On statin and ACEi F/u CMP and lipid profile Diabetic eye exam: Advised to follow up with Ophthalmology for diabetic eye exam

## 2023-07-18 NOTE — Progress Notes (Signed)
 Discontinued IV Orencia infusion orders pending GI evaluation  Chesley Mires, PharmD, MPH, BCPS, CPP Clinical Pharmacist (Rheumatology and Pulmonology)

## 2023-07-19 ENCOUNTER — Encounter: Payer: Self-pay | Admitting: Physician Assistant

## 2023-07-19 ENCOUNTER — Ambulatory Visit: Admitting: Physician Assistant

## 2023-07-19 VITALS — Ht 67.32 in | Wt 208.8 lb

## 2023-07-19 DIAGNOSIS — M1711 Unilateral primary osteoarthritis, right knee: Secondary | ICD-10-CM

## 2023-07-19 NOTE — Assessment & Plan Note (Signed)
 Severe right knee pain Was taking Tylenol, had to stop it due to elevated transaminase Tramadol as needed for severe pain Unable to take oral NSAIDs due to being on anticoagulation and history of GERD

## 2023-07-19 NOTE — Assessment & Plan Note (Signed)
 BP Readings from Last 1 Encounters:  07/18/23 138/72   Well-controlled with amlodipine and Ramipril Counseled for compliance with the medications Advised DASH diet and moderate exercise/walking as tolerated

## 2023-07-19 NOTE — Progress Notes (Signed)
 HPI: Joel Jones comes in today for possible reaspiration of the right knee.  He has been cleared by Dr. Jens Jones to proceed with right knee revision.  This will give right knee medial arthroplasty to total knee arthroplasty.  Said no new injury to the knee.  As constant pain in the knee.  He is ambulating with a cane.  Review of systems denies any fevers chills.  Physical exam: Height 1.71 meters ,weight 208 pounds, BMI 32.39 kg/m  General No acute distress mood and affect appropriate. Respirations: Unlabored Psych: Alert and oriented x 3 Right knee: Positive effusion knees aspirated 46 cc of bloody aspirate obtained patient tolerates well.  Full extension flexion beyond 90 degrees no gross instability.  Impression: Right knee recurrent effusion Right knee lateral and patellofemoral compartment arthritis History of right knee Uni medial compartmental arthroplasty  Plan: Given patient's continued pain despite conservative measures and recurrent effusion recommend converting his right Uni knee to a total knee arthroplasty.  Will proceed with this in the near future.  He will need to be off of his Eliquis at least 3 full days prior to surgery.  Questions were encouraged and answered at length.  Risk benefits of surgery discussed.  Risk include but are not limited to DVT infection, wound healing problems, prolonged pain, nerve vessel injury.  Will see him back 2 weeks postop.

## 2023-07-19 NOTE — Assessment & Plan Note (Addendum)
 Rate controlled S/p Afib ablation on 05/23/23 by Dr. Nelly Laurence. No episodes of Afib since ablation.  On Eliquis Followed by cardiology

## 2023-07-24 ENCOUNTER — Encounter (INDEPENDENT_AMBULATORY_CARE_PROVIDER_SITE_OTHER): Payer: Self-pay | Admitting: *Deleted

## 2023-07-25 ENCOUNTER — Encounter (HOSPITAL_COMMUNITY): Payer: Medicare Other

## 2023-07-25 LAB — CMP14+EGFR
ALT: 141 IU/L — ABNORMAL HIGH (ref 0–44)
AST: 44 IU/L — ABNORMAL HIGH (ref 0–40)
Albumin: 4.4 g/dL (ref 3.8–4.8)
Alkaline Phosphatase: 327 IU/L — ABNORMAL HIGH (ref 44–121)
BUN/Creatinine Ratio: 19 (ref 10–24)
BUN: 18 mg/dL (ref 8–27)
Bilirubin Total: 0.5 mg/dL (ref 0.0–1.2)
CO2: 25 mmol/L (ref 20–29)
Calcium: 9.2 mg/dL (ref 8.6–10.2)
Chloride: 101 mmol/L (ref 96–106)
Creatinine, Ser: 0.95 mg/dL (ref 0.76–1.27)
Globulin, Total: 2.1 g/dL (ref 1.5–4.5)
Glucose: 117 mg/dL — ABNORMAL HIGH (ref 70–99)
Potassium: 3.7 mmol/L (ref 3.5–5.2)
Sodium: 140 mmol/L (ref 134–144)
Total Protein: 6.5 g/dL (ref 6.0–8.5)
eGFR: 83 mL/min/{1.73_m2} (ref >59)

## 2023-07-25 LAB — PROTIME-INR
INR: 2.2 — ABNORMAL HIGH (ref 0.9–1.2)
Prothrombin Time: 23 s — ABNORMAL HIGH (ref 9.1–12.0)

## 2023-07-25 NOTE — Progress Notes (Signed)
 It was office visit. It was assigned mistakenly as video visit while making appointment by the staff.

## 2023-07-26 ENCOUNTER — Other Ambulatory Visit: Payer: Self-pay | Admitting: Physician Assistant

## 2023-07-26 DIAGNOSIS — M1711 Unilateral primary osteoarthritis, right knee: Secondary | ICD-10-CM

## 2023-07-26 MED ORDER — TRAMADOL HCL 50 MG PO TABS: 50.0000 mg | ORAL_TABLET | Freq: Four times a day (QID) | ORAL | 0 refills | Status: DC | PRN

## 2023-07-30 ENCOUNTER — Ambulatory Visit: Attending: General Practice | Admitting: *Deleted

## 2023-07-30 DIAGNOSIS — Z5181 Encounter for therapeutic drug level monitoring: Secondary | ICD-10-CM

## 2023-07-30 DIAGNOSIS — I4891 Unspecified atrial fibrillation: Secondary | ICD-10-CM

## 2023-07-30 DIAGNOSIS — Z7901 Long term (current) use of anticoagulants: Secondary | ICD-10-CM

## 2023-07-30 LAB — POCT INR: INR: 2.3 (ref 2.0–3.0)

## 2023-07-30 NOTE — Patient Instructions (Signed)
 Description   WHEN YOU ARE READY TO SWITCH TO ELIQUIS CALL us AT [phone] Continue taking warfarin 1 tablet daily except for 1/2 tablet on Fridays. Stay consistent with greens each week (1 per week).  Recheck INR in 2 weeks. [facility] [phone]  Cardiac Clearance Fax# [phone]     Cardiac Clearance Fax# [phone]

## 2023-07-31 ENCOUNTER — Inpatient Hospital Stay (HOSPITAL_COMMUNITY): Admission: RE | Admit: 2023-07-31 | Payer: Medicare Other | Source: Ambulatory Visit

## 2023-08-01 ENCOUNTER — Telehealth: Payer: Self-pay | Admitting: Cardiology

## 2023-08-01 NOTE — Telephone Encounter (Signed)
 Pt c/o medication issue:  1. Name of Medication:   apixaban (ELIQUIS) 5 MG TABS tablet    2. How are you currently taking this medication (dosage and times per day)? Not currently taking   3. Are you having a reaction (difficulty breathing--STAT)? No   4. What is your medication issue? Patient is calling stating an insurance agent advised they call and request patient assistance for Eliquis. Please advise.

## 2023-08-01 NOTE — Telephone Encounter (Signed)
   Patient identification verified by 2 forms. Joel Rail, RN    Called and spoke to patients wife Joel Jones  Informed Joel Jones patient assistance will not be an option for a high deductible  Joel Jones verbalized understanding, no questions at this time

## 2023-08-01 NOTE — Telephone Encounter (Signed)
 Patient identification verified by 2 forms. [name], RN    Called and spoke to patients wife Joel Jones states:   -would like to know samples can be given   -insurance agency will work on applying for patient assistance for patient   -cost of Rx is ~$300 and then cost will decrease to ~$40   -would like to apply for patient assistance program  Informed [name] message sent to pharmacy and nursing will outreach with information

## 2023-08-02 ENCOUNTER — Ambulatory Visit (HOSPITAL_COMMUNITY)
Admission: RE | Admit: 2023-08-02 | Discharge: 2023-08-02 | Disposition: A | Discharge status: Home / Self Care | Source: Ambulatory Visit | Attending: Internal Medicine | Admitting: Internal Medicine

## 2023-08-02 DIAGNOSIS — R7401 Elevation of levels of liver transaminase levels: Secondary | ICD-10-CM

## 2023-08-08 NOTE — Progress Notes (Signed)
 Please check manage patient seeing a gastroenterologist for elevated LFTs.

## 2023-08-09 NOTE — Pre-Procedure Instructions (Signed)
 Surgical Instructions   Your procedure is scheduled on Tuesday, April 8th. Report to [facility] Main Entrance "A" at 09:30 A.M., then check in with the Admitting office. Any questions or running late day of surgery: call [phone]  Questions prior to your surgery date: call [phone], Monday-Friday, 8am-4pm. If you experience any cold or flu symptoms such as cough, fever, chills, shortness of breath, etc. between now and your scheduled surgery, please notify us at the above number.     Remember:  Do not eat after midnight the night before your surgery  You Hendrickson drink clear liquids until 08:30 AM the morning of your surgery.   Clear liquids allowed are: Water, Non-Citrus Juices (without pulp), Carbonated Beverages, Clear Tea (no milk, honey, etc.), Black Coffee Only (NO MILK, CREAM OR POWDERED CREAMER of any kind), and Gatorade.  Patient Instructions  The night before surgery:  No food after midnight. ONLY clear liquids after midnight   The day of surgery (if you have diabetes): Drink ONE (1) 12 oz G2 given to you in your pre admission testing appointment by 08:30 AM the morning of surgery. Drink in one sitting. Do not sip.  This drink was given to you during your hospital  pre-op appointment visit.  Nothing else to drink after completing the  12 oz bottle of G2.         If you have questions, please contact your surgeon's office.    Take these medicines the morning of surgery with A SIP OF WATER  amLODipine (NORVASC)  loratadine (CLARITIN)  pantoprazole (PROTONIX)  pravastatin (PRAVACHOL)   Wrightson take these medicines IF NEEDED: traMADol (ULTRAM)    Follow your surgeon's instructions on when to stop apixaban (ELIQUIS).  If no instructions were given by your surgeon then you will need to call the office to get those instructions.    One week prior to surgery, STOP taking any Aspirin (unless otherwise instructed by your surgeon) Aleve, Naproxen, Ibuprofen, Motrin, Advil,  Goody's, BC's, all herbal medications, fish oil, and non-prescription vitamins.  WHAT DO I DO ABOUT MY DIABETES MEDICATION?   Insulin pump: Contact PCP or Endocrinologist OR Reduce all basal rates by 20% at midnight the night before surgery.  If you have to use insulin glargine (LANTUS) the night before surgery OR the morning of surgery, take 8 units (50%).      HOW TO MANAGE YOUR DIABETES BEFORE AND AFTER SURGERY  Why is it important to control my blood sugar before and after surgery? Improving blood sugar levels before and after surgery helps healing and can limit problems. A way of improving blood sugar control is eating a healthy diet by:  Eating less sugar and carbohydrates  Increasing activity/exercise  Talking with your doctor about reaching your blood sugar goals High blood sugars (greater than 180 mg/dL) can raise your risk of infections and slow your recovery, so you will need to focus on controlling your diabetes during the weeks before surgery. Make sure that the doctor who takes care of your diabetes knows about your planned surgery including the date and location.  How do I manage my blood sugar before surgery? Check your blood sugar at least 4 times a day, starting 2 days before surgery, to make sure that the level is not too high or low.  Check your blood sugar the morning of your surgery when you wake up and every 2 hours until you get to the Short Stay unit.  If your blood sugar is less  than 70 mg/dL, you will need to treat for low blood sugar: Do not take insulin. Treat a low blood sugar (less than 70 mg/dL) with  cup of clear juice (cranberry or apple), 4 glucose tablets, OR glucose gel. Recheck blood sugar in 15 minutes after treatment (to make sure it is greater than 70 mg/dL). If your blood sugar is not greater than 70 mg/dL on recheck, call [phone] for further instructions. Report your blood sugar to the short stay nurse when you get to Short Stay.  If  you are admitted to the hospital after surgery: Your blood sugar will be checked by the staff and you will probably be given insulin after surgery (instead of oral diabetes medicines) to make sure you have good blood sugar levels. The goal for blood sugar control after surgery is 80-180 mg/dL.                     Do NOT Smoke (Tobacco/Vaping) for 24 hours prior to your procedure.  If you use a CPAP at night, you Eller[place] bring your mask/headgear for your overnight stay.   You will be asked to remove any contacts, glasses, piercing's, hearing aid's, dentures/partials prior to surgery. Please bring cases for these items if needed.    Patients discharged the day of surgery will not be allowed to drive home, and someone needs to stay with them for 24 hours.  SURGICAL WAITING ROOM VISITATION Patients Frisby have no more than 2 support people in the waiting area - these visitors Higashi rotate.   Pre-op nurse will coordinate an appropriate time for 1 ADULT support person, who Paolucci not rotate, to accompany patient in pre-op.  Children under the age of 1 must have an adult with them who is not the patient and must remain in the main waiting area with an adult.  If the patient needs to stay at the hospital during part of their recovery, the visitor guidelines for inpatient rooms apply.  Please refer to the [facility] website for the visitor guidelines for any additional information.   If you received a COVID test during your pre-op visit  it is requested that you wear a mask when out in public, stay away from anyone that Robles not be feeling well and notify your surgeon if you develop symptoms. If you have been in contact with anyone that has tested positive in the last 10 days please notify you surgeon.      Pre-operative 5 CHG Bathing Instructions   You can play a key role in reducing the risk of infection after surgery. Your skin needs to be as free of germs as possible. You can reduce the number of germs  on your skin by washing with CHG (chlorhexidine gluconate) soap before surgery. CHG is an antiseptic soap that kills germs and continues to kill germs even after washing.   DO NOT use if you have an allergy to chlorhexidine/CHG or antibacterial soaps. If your skin becomes reddened or irritated, stop using the CHG and notify one of our RNs at [phone].   Please shower with the CHG soap starting 4 days before surgery using the following schedule:     Please keep in mind the following:  DO NOT shave, including legs and underarms, starting the day of your first shower.   You [place] shave your face at any point before/day of surgery.  Place clean sheets on your bed the day you start using CHG soap. Use a clean washcloth (  not used since being washed) for each shower. DO NOT sleep with pets once you start using the CHG.   CHG Shower Instructions:  Wash your face and private area with normal soap. If you choose to wash your hair, wash first with your normal shampoo.  After you use shampoo/soap, rinse your hair and body thoroughly to remove shampoo/soap residue.  Turn the water OFF and apply about 3 tablespoons (45 ml) of CHG soap to a CLEAN washcloth.  Apply CHG soap ONLY FROM YOUR NECK DOWN TO YOUR TOES (washing for 3-5 minutes)  DO NOT use CHG soap on face, private areas, open wounds, or sores.  Pay special attention to the area where your surgery is being performed.  If you are having back surgery, having someone wash your back for you Brunson be helpful. Wait 2 minutes after CHG soap is applied, then you Moultry rinse off the CHG soap.  Pat dry with a clean towel  Put on clean clothes/pajamas   If you choose to wear lotion, please use ONLY the CHG-compatible lotions that are listed below.  Additional instructions for the day of surgery: DO NOT APPLY any lotions, deodorants, cologne, or perfumes.   Do not bring valuables to the hospital. [facility] is not responsible for any  belongings/valuables. Do not wear nail polish, gel polish, artificial nails, or any other type of covering on natural nails (fingers and toes) Do not wear jewelry or makeup Put on clean/comfortable clothes.  Please brush your teeth.  Ask your nurse before applying any prescription medications to the skin.     CHG Compatible Lotions   Aveeno Moisturizing lotion  Cetaphil Moisturizing Cream  Cetaphil Moisturizing Lotion  Clairol Herbal Essence Moisturizing Lotion, Dry Skin  Clairol Herbal Essence Moisturizing Lotion, Extra Dry Skin  Clairol Herbal Essence Moisturizing Lotion, Normal Skin  Curel Age Defying Therapeutic Moisturizing Lotion with Alpha Hydroxy  Curel Extreme Care Body Lotion  Curel Soothing Hands Moisturizing Hand Lotion  Curel Therapeutic Moisturizing Cream, Fragrance-Free  Curel Therapeutic Moisturizing Lotion, Fragrance-Free  Curel Therapeutic Moisturizing Lotion, Original Formula  Eucerin Daily Replenishing Lotion  Eucerin Dry Skin Therapy Plus Alpha Hydroxy Crme  Eucerin Dry Skin Therapy Plus Alpha Hydroxy Lotion  Eucerin Original Crme  Eucerin Original Lotion  Eucerin Plus Crme Eucerin Plus Lotion  Eucerin TriLipid Replenishing Lotion  Keri Anti-Bacterial Hand Lotion  Keri Deep Conditioning Original Lotion Dry Skin Formula Softly Scented  Keri Deep Conditioning Original Lotion, Fragrance Free Sensitive Skin Formula  Keri Lotion Fast Absorbing Fragrance Free Sensitive Skin Formula  Keri Lotion Fast Absorbing Softly Scented Dry Skin Formula  Keri Original Lotion  Keri Skin Renewal Lotion Keri Silky Smooth Lotion  Keri Silky Smooth Sensitive Skin Lotion  Nivea Body Creamy Conditioning Oil  Nivea Body Extra Enriched Lotion  Nivea Body Original Lotion  Nivea Body Sheer Moisturizing Lotion Nivea Crme  Nivea Skin Firming Lotion  NutraDerm 30 Skin Lotion  NutraDerm Skin Lotion  NutraDerm Therapeutic Skin Cream  NutraDerm Therapeutic Skin Lotion  ProShield  Protective Hand Cream  Provon moisturizing lotion  Please read over the following fact sheets that you were given.

## 2023-08-10 ENCOUNTER — Encounter (HOSPITAL_COMMUNITY): Payer: Self-pay

## 2023-08-10 ENCOUNTER — Encounter (HOSPITAL_COMMUNITY)
Admission: RE | Admit: 2023-08-10 | Discharge: 2023-08-10 | Disposition: A | Discharge status: Home / Self Care | Source: Ambulatory Visit | Attending: Orthopaedic Surgery | Admitting: Orthopaedic Surgery

## 2023-08-10 ENCOUNTER — Other Ambulatory Visit: Payer: Self-pay

## 2023-08-10 VITALS — BP 115/75 | HR 50 | Temp 97.7°F | Resp 18 | Ht 66.0 in | Wt 198.0 lb

## 2023-08-10 DIAGNOSIS — Z01812 Encounter for preprocedural laboratory examination: Secondary | ICD-10-CM | POA: Insufficient documentation

## 2023-08-10 DIAGNOSIS — R7989 Other specified abnormal findings of blood chemistry: Secondary | ICD-10-CM | POA: Insufficient documentation

## 2023-08-10 DIAGNOSIS — Z794 Long term (current) use of insulin: Secondary | ICD-10-CM | POA: Diagnosis not present

## 2023-08-10 DIAGNOSIS — K449 Diaphragmatic hernia without obstruction or gangrene: Secondary | ICD-10-CM | POA: Insufficient documentation

## 2023-08-10 DIAGNOSIS — Z01818 Encounter for other preprocedural examination: Secondary | ICD-10-CM

## 2023-08-10 DIAGNOSIS — Z87891 Personal history of nicotine dependence: Secondary | ICD-10-CM | POA: Insufficient documentation

## 2023-08-10 DIAGNOSIS — E119 Type 2 diabetes mellitus without complications: Secondary | ICD-10-CM | POA: Insufficient documentation

## 2023-08-10 DIAGNOSIS — M069 Rheumatoid arthritis, unspecified: Secondary | ICD-10-CM | POA: Insufficient documentation

## 2023-08-10 DIAGNOSIS — M1711 Unilateral primary osteoarthritis, right knee: Secondary | ICD-10-CM | POA: Diagnosis not present

## 2023-08-10 DIAGNOSIS — K219 Gastro-esophageal reflux disease without esophagitis: Secondary | ICD-10-CM | POA: Diagnosis not present

## 2023-08-10 DIAGNOSIS — R001 Bradycardia, unspecified: Secondary | ICD-10-CM | POA: Diagnosis not present

## 2023-08-10 DIAGNOSIS — Z79899 Other long term (current) drug therapy: Secondary | ICD-10-CM | POA: Diagnosis not present

## 2023-08-10 DIAGNOSIS — I10 Essential (primary) hypertension: Secondary | ICD-10-CM | POA: Insufficient documentation

## 2023-08-10 DIAGNOSIS — I2510 Atherosclerotic heart disease of native coronary artery without angina pectoris: Secondary | ICD-10-CM | POA: Insufficient documentation

## 2023-08-10 DIAGNOSIS — I4891 Unspecified atrial fibrillation: Secondary | ICD-10-CM | POA: Insufficient documentation

## 2023-08-10 DIAGNOSIS — Z9641 Presence of insulin pump (external) (internal): Secondary | ICD-10-CM | POA: Insufficient documentation

## 2023-08-10 DIAGNOSIS — Z7901 Long term (current) use of anticoagulants: Secondary | ICD-10-CM | POA: Diagnosis not present

## 2023-08-10 DIAGNOSIS — E785 Hyperlipidemia, unspecified: Secondary | ICD-10-CM | POA: Diagnosis not present

## 2023-08-10 LAB — SURGICAL PCR SCREEN
MRSA, PCR: NEGATIVE
Staphylococcus aureus: NEGATIVE

## 2023-08-10 LAB — CBC
HCT: 40.1 % (ref 39.0–52.0)
Hemoglobin: 13.4 g/dL (ref 13.0–17.0)
MCH: 30.7 pg (ref 26.0–34.0)
MCHC: 33.4 g/dL (ref 30.0–36.0)
MCV: 92 fL (ref 80.0–100.0)
Platelets: 250 10*3/uL (ref 150–400)
RBC: 4.36 MIL/uL (ref 4.22–5.81)
RDW: 12.9 % (ref 11.5–15.5)
WBC: 6.2 10*3/uL (ref 4.0–10.5)
nRBC: 0 % (ref 0.0–0.2)

## 2023-08-10 LAB — BASIC METABOLIC PANEL WITH GFR
Anion gap: 7 (ref 5–15)
BUN: 14 mg/dL (ref 8–23)
CO2: 26 mmol/L (ref 22–32)
Calcium: 8.7 mg/dL — ABNORMAL LOW (ref 8.9–10.3)
Chloride: 103 mmol/L (ref 98–111)
Creatinine, Ser: 1.11 mg/dL (ref 0.61–1.24)
GFR, Estimated: >60 mL/min (ref >60)
Glucose, Bld: 287 mg/dL — ABNORMAL HIGH (ref 70–99)
Potassium: 4 mmol/L (ref 3.5–5.1)
Sodium: 136 mmol/L (ref 135–145)

## 2023-08-10 LAB — GLUCOSE, CAPILLARY: Glucose-Capillary: 214 mg/dL — ABNORMAL HIGH (ref 70–99)

## 2023-08-10 NOTE — Progress Notes (Signed)
 PCP - Dr. Ruthine Dose Cardiologist - Olga Millers  PPM/ICD - Denies Device Orders - n/a Rep Notified - n/a  Chest x-ray - n/a EKG - 06-20-23 Stress Test - 03-20-23 ECHO - 11-27-22 Cardiac Cath - 10-03-2002  Sleep Study - Denies CPAP - n/a  Fasting Blood Sugar - patient is not sure Checks Blood Sugar  MULTIPLE times a day. Patient has a medtronix insulin pump  Last dose of GLP1 agonist-  n/a GLP1 instructions: n/a  Blood Thinner Instructions: Eliquis - per patient he is to stop eliquis on April 4 Aspirin Instructions: Denies taking  ERAS Protcol -clears till 0830 PRE-SURGERY Ensure or G2- G2   COVID TEST- n/a   Anesthesia review: Yes, DM, hx A-fib, Insulin pump  Patient denies shortness of breath, fever, cough and chest pain at PAT appointment. Patient denies any respiratory issues at this time.    All instructions explained to the patient, with a verbal understanding of the material. Patient agrees to go over the instructions while at home for a better understanding. Patient also instructed to self quarantine after being tested for COVID-19. The opportunity to ask questions was provided.

## 2023-08-13 ENCOUNTER — Ambulatory Visit: Attending: General Practice

## 2023-08-13 ENCOUNTER — Encounter (HOSPITAL_COMMUNITY): Payer: Self-pay | Admitting: Vascular Surgery

## 2023-08-13 DIAGNOSIS — Z5181 Encounter for therapeutic drug level monitoring: Secondary | ICD-10-CM | POA: Diagnosis not present

## 2023-08-13 DIAGNOSIS — Z7901 Long term (current) use of anticoagulants: Secondary | ICD-10-CM | POA: Diagnosis not present

## 2023-08-13 DIAGNOSIS — I4891 Unspecified atrial fibrillation: Secondary | ICD-10-CM

## 2023-08-13 LAB — POCT INR: INR: 2.3 (ref 2.0–3.0)

## 2023-08-13 NOTE — Progress Notes (Signed)
 Anesthesia Chart Review:  Case: [id number] Date/Time: 08/21/23 1115   Procedure: CONVERSION, ARTHROPLASTY, KNEE, PARTIAL, TO TOTAL KNEE ARTHROPLASTY (Right: Knee)   Anesthesia type: Spinal   Diagnosis: Primary osteoarthritis of right knee [M17.11]   Pre-op diagnosis: osteoarthritis right knee with previous unicompartmental knee arthroplasty   [facility] RO[facility] 05 / [facility] OR   Surgeons: Kathryne Hitch, MD       DISCUSSION: Patient is a 78 year old male scheduled for the above procedure.  History includes former smoker (quit 05/15/68), HTN, afib (DCCV 11/08/22; s/p ablation 05/23/23), bradycardia, HLD, GERD, hiatal hernia, DM2 (with insulin pump), RA, osteoarthritis.   A1c 7.6% on 08/02/23 [facility]).    Recent follow-up with PCP [name], MD on 07/18/23 due to elevated transminase levels on 07/03/23 with alk phos 153, AST 52, ALT 103. Repeat labs on 07/16/23 were further elevated with alk pho 238, AST 277, ALT 230. His statin and Tylenol were held and advised to hydrate. 08/02/23 US showed gallbladder sludge but no inflammation or acute findings. Repeat labs on 07/24/23 showed alk phos up to 327, but AST now down to 44 and ALT down to 141. He was asymptomatic. No alcohol use is listed.   Last cardiology visit with Dr. Jens [name] was on 07/16/23. He was maintaining SR post ablation. Avoiding AV nodal blocking agents due to bradycardia. He wished to change from warfarin to Eliquis. Moderate-to severe TR on echo with plans to re-check later this year. On statin therapy for mild CAD, HLD, but held due to increased LFTs. In regards to surgery, Dr. Jens [name] wrote, "Preoperative evaluation prior to knee replacement-he is not having exertional chest pain. He Goswami proceed without further cardiac evaluation. Hold apixaban 3 days prior to procedure and resume after when okay with orthopedics." Last Eliquis planned for 08/17/23.   He has pending GI evaluation with Sanjuan Dame, MD on 08/15/23.   VS: BP 115/75    Pulse (!) 50   Temp 36.5 C   Resp 18   Ht 5\' 6"  (1.676 m)   Wt 89.8 kg   SpO2 98%   BMI 31.96 kg/m    PROVIDERS: [name], MD is PCP  Olga Millers, MD is cardiologist  Mealor, Jari Pigg, MD is EP cardiologist Pollyann Savoy, MD is rheumatologist Daune Perch, MD is endocrinologist   LABS: Preoperative labs noted. See DISCUSSION.  (all labs ordered are listed, but only abnormal results are displayed)  Labs Reviewed  GLUCOSE, CAPILLARY - Abnormal; Notable for the following components:      Result Value   Glucose-Capillary 214 (*)    All other components within normal limits  BASIC METABOLIC PANEL WITH GFR - Abnormal; Notable for the following components:   Glucose, Bld 287 (*)    Calcium 8.7 (*)    All other components within normal limits  SURGICAL PCR SCREEN  CBC     IMAGES: [place] Abd RUQ 08/02/23: IMPRESSION: Sludge in the gallbladder. No sonographic evidence of acute cholecystitis.   CT Head 05/28/23: IMPRESSION: 1. No acute intracranial abnormality. 2. Partial opacification of the sphenoid sinus.  1V PCXR 05/28/23: FINDINGS: Patient is rotated to the right. Overlapping telemetry wires. The heart size and mediastinal contours are within normal limits. No focal consolidation, sizeable pleural effusion, or pneumothorax. No acute osseous abnormality. IMPRESSION: No acute cardiopulmonary findings.   EKG: 06/20/23: Sinus bradycardia at 49 bpm Otherwise normal ECG When compared with ECG of 29-May-2023 16:46, Rate slower Confirmed by Donato Schultz ([id number]) on  06/21/2023 2:00:23 PM   CV: CT Cardiac Morph (pre-ablation) 04/25/23: Coronary Calcium Score: Left main: 0 Left anterior descending artery: 272 Left circumflex artery: 0 Right coronary artery: 55 Total: 327 Percentile: 54th for age, sex, and race matched control.   Aorta: Normal caliber.  Aortic atherosclerosis. Main Pulmonary Artery: Normal caliber. Systemic veins: Normal  drainage Aortic Valve: Tri-leaflet.  AV calcium score of 3. Mitral valve: No calcifications. Chamber dimensions: Right ventricular and right atrial dilation Pericardium: Normal thickness    IMPRESSION: 1. There is normal pulmonary vein drainage into the left atrium. 2. The left atrial appendage is patent. 3. The esophagus runs in the left atrial midline and is not in the proximity to any of the pulmonary veins.     Nuclear stress test 03/20/23:   Findings are consistent with no ischemia. The study is low risk.   No ST deviation was noted.   LV perfusion is abnormal. Defect 1: There is a small defect with mild reduction in uptake present in the apical inferior location(s) that is fixed. Viability is present. There is normal wall motion in the defect area. Consistent with artifact.   Left ventricular function is abnormal. Nuclear stress EF: 52%. The left ventricular ejection fraction is mildly decreased (45-54%). End diastolic cavity size is normal. End systolic cavity size is normal.   Prior study not available for comparison.   Small apical perfusion defect with normal function most consistent with artifact. Low normal LVEF that appears visualize normal; sensitivity of LVEF assessment is decreased in the setting of atrial fibrillation imaging. Low risk study.   Echo 11/27/22: IMPRESSIONS   1. Left ventricular ejection fraction, by estimation, is 60 to 65%. The  left ventricle has normal function. The left ventricle has no regional  wall motion abnormalities. There is mild concentric left ventricular  hypertrophy. Left ventricular diastolic  function could not be evaluated.   2. Right ventricular systolic function is normal. The right ventricular  size is normal.   3. Left atrial size was mildly dilated.   4. Right atrial size was mildly dilated.   5. The mitral valve is normal in structure. Trivial mitral valve  regurgitation. No evidence of mitral stenosis.   6. Tricuspid valve  regurgitation is moderate to severe.   7. The aortic valve is normal in structure. There is mild calcification  of the aortic valve. There is mild thickening of the aortic valve. Aortic  valve regurgitation is not visualized. No aortic stenosis is present.   8. The inferior vena cava is normal in size with greater than 50%  respiratory variability, suggesting right atrial pressure of 3 mmHg.    US Carotid 03/10/16: IMPRESSION: Minimal amount of bilateral atherosclerotic plaque, left subjectively greater than right, unchanged to minimally progressed since the 02/2012 examination though again not resulting in a hemodynamically significant stenosis within either internal carotid artery.    Past Medical History:  Diagnosis Date   A-fib [facility])    AKI (acute kidney injury) (HCC) 05/18/2021   Anemia    Arthritis    "all over"   Bradycardia    Chronic bronchitis (HCC)    "get it just about q yr" (03/17/2014)   Daily headache    "here lately" (03/17/2014); relates to sinuses   GERD (gastroesophageal reflux disease)    Hard of hearing    hearing aides bilat   Hiatal hernia    History of blood transfusion 2008   "related to OR"   History of kidney stones  Hyperlipidemia    Hypertension    Pneumonia 1972 X 1   Rheumatoid arthritis (HCC)    Type II diabetes mellitus (HCC)    Wears glasses     Past Surgical History:  Procedure Laterality Date   ATRIAL FIBRILLATION ABLATION N/A 05/23/2023   Procedure: ATRIAL FIBRILLATION ABLATION;  Surgeon: Maurice Small, MD;  Location: [facility] INVASIVE CV LAB;  Service: Cardiovascular;  Laterality: N/A;   broken finger      surgical repaired left hand 2nd finger    CARDIOVERSION N/A 11/08/2022   Procedure: CARDIOVERSION;  Surgeon: Maisie Fus, MD;  Location: [facility] INVASIVE CV LAB;  Service: Cardiovascular;  Laterality: N/A;   cyst removed      per left knee/posteriorly   CYSTECTOMY Left 2009   "behind knee"   INGUINAL HERNIA REPAIR Right ?2010    JOINT REPLACEMENT     KNEE ARTHROSCOPY Bilateral 1980's   PARTIAL KNEE ARTHROPLASTY Right 08/27/2015   Procedure: RIGHT UNICOMPARTMENTAL KNEE ARTHROPLASTY;  Surgeon: Kathryne Hitch, MD;  Location: [facility] ORS;  Service: Orthopedics;  Laterality: Right;   REVISION TOTAL KNEE ARTHROPLASTY Left 2008   SHOULDER SURGERY Left 2019   TOTAL KNEE ARTHROPLASTY Left 2003    MEDICATIONS:  Abatacept (ORENCIA IV)   amLODipine (NORVASC) 5 MG tablet   apixaban (ELIQUIS) 5 MG TABS tablet   ascorbic acid (VITAMIN C) 500 MG tablet   calcium carbonate (OSCAL) 1500 (600 Ca) MG TABS tablet   cholecalciferol (VITAMIN D3) 25 MCG (1000 UNIT) tablet   Insulin Aspart, w/Niacinamide, (FIASP) 100 UNIT/ML SOLN   insulin glargine (LANTUS) 100 UNIT/ML injection   Insulin Human (INSULIN PUMP) SOLN   KRILL OIL PO   loratadine (CLARITIN) 10 MG tablet   Multiple Vitamin (MULTIVITAMIN WITH MINERALS) TABS   ONE TOUCH ULTRA TEST test strip   pantoprazole (PROTONIX) 40 MG tablet   Potassium 99 MG TABS   pravastatin (PRAVACHOL) 40 MG tablet   pyridOXINE (VITAMIN B6) 50 MG tablet   ramipril (ALTACE) 5 MG capsule   traMADol (ULTRAM) 50 MG tablet   vitamin E 400 UNIT capsule   zinc gluconate 50 MG tablet   No current facility-administered medications for this encounter.    Shonna Chock, PA-C Surgical Short Stay/Anesthesiology [facility] Phone [phone] [facility] Phone [phone] 08/13/2023 4:17 PM

## 2023-08-13 NOTE — Patient Instructions (Signed)
 Eliquis started this morning 08/13/23;  Continue Eliquis two times daily, but Hold 3 days prior to Knee Surgery 08/21/23;  4/5, 6, 7.    Cardiac Clearance Fax# [phone]

## 2023-08-15 ENCOUNTER — Ambulatory Visit (INDEPENDENT_AMBULATORY_CARE_PROVIDER_SITE_OTHER): Admitting: Gastroenterology

## 2023-08-15 ENCOUNTER — Encounter (INDEPENDENT_AMBULATORY_CARE_PROVIDER_SITE_OTHER): Payer: Self-pay | Admitting: Gastroenterology

## 2023-08-15 VITALS — BP 141/75 | HR 51 | Temp 97.8°F | Ht 67.0 in | Wt 206.6 lb

## 2023-08-15 DIAGNOSIS — K719 Toxic liver disease, unspecified: Secondary | ICD-10-CM | POA: Insufficient documentation

## 2023-08-15 DIAGNOSIS — R7989 Other specified abnormal findings of blood chemistry: Secondary | ICD-10-CM | POA: Diagnosis not present

## 2023-08-15 DIAGNOSIS — R748 Abnormal levels of other serum enzymes: Secondary | ICD-10-CM | POA: Insufficient documentation

## 2023-08-15 NOTE — Patient Instructions (Signed)
It was very nice to meet you today, as dicussed with will plan for the following :  1) Labwork

## 2023-08-15 NOTE — Progress Notes (Signed)
 [name] , M.D. Gastroenterology & Hepatology [name] Gastroenterology [address] [place], [place] [address] Primary Care Physician: [name], MD [address] [place] [address]  Chief Complaint:  elevated liver enzymes   History of Present Illness: Joel Steeves [name] is a 78 y.o. male with A-fib on Eliquis, coronary artery disease, bradycardia, hypertension, hyperlipidemia who presents for evaluation of elevated liver enzymes .  Patient reports that he has osteoarthritis and usually takes extra strength Tylenol 500 mg.  Past couple months he was taking 6 tablets a day.  Denies starting any new medications, herbal medications or any supplements.  Denies any generalized itching or yellowing of the skin. The patient denies having any nausea, vomiting, fever, chills, hematochezia, melena, hematemesis, abdominal distention, abdominal pain, diarrhea, jaundice, pruritus or weight loss.  Last UJW:JXBJ Last Colonoscopy:08/2022 at [facility] GI Preparation.  3 subcentimeter polyps removed  FHx: neg for any gastrointestinal/liver disease, no malignancies Social: neg smoking, alcohol or illicit drug use Surgical: Inguinal hernia repair  Labs from 07/16/2023 ALP 238 AST 277 ALT 230 Labs from 07/24/2023 ALP 327 AST 44 ALT 141 INR 2.2  Past Medical History: Past Medical History:  Diagnosis Date   A-fib (HCC)    AKI (acute kidney injury) (HCC) 05/18/2021   Anemia    Arthritis    "all over"   Bradycardia    Chronic bronchitis (HCC)    "get it just about q yr" (03/17/2014)   Daily headache    "here lately" (03/17/2014); relates to sinuses   GERD (gastroesophageal reflux disease)    Hard of hearing    hearing aides bilat   Hiatal hernia    History of blood transfusion 2008   "related to OR"   History of kidney stones    Hyperlipidemia    Hypertension    Pneumonia 1972 X 1   Rheumatoid arthritis (HCC)    Type II diabetes mellitus (HCC)     Wears glasses     Past Surgical History: Past Surgical History:  Procedure Laterality Date   ATRIAL FIBRILLATION ABLATION N/A 05/23/2023   Procedure: ATRIAL FIBRILLATION ABLATION;  Surgeon: Maurice Small, MD;  [facility];  Service: Cardiovascular;  Laterality: N/A;   broken finger      surgical repaired left hand 2nd finger    CARDIOVERSION N/A 11/08/2022   Procedure: CARDIOVERSION;  Surgeon: Maisie Fus, MD;  [facility];  Service: Cardiovascular;  Laterality: N/A;   cyst removed      per left knee/posteriorly   CYSTECTOMY Left 2009   "behind knee"   INGUINAL HERNIA REPAIR Right ?2010   JOINT REPLACEMENT     KNEE ARTHROSCOPY Bilateral 1980's   PARTIAL KNEE ARTHROPLASTY Right 08/27/2015   Procedure: RIGHT UNICOMPARTMENTAL KNEE ARTHROPLASTY;  Surgeon: Kathryne Hitch, MD;  Location: [facility];  Service: Orthopedics;  Laterality: Right;   REVISION TOTAL KNEE ARTHROPLASTY Left 2008   SHOULDER SURGERY Left 2019   TOTAL KNEE ARTHROPLASTY Left 2003    Family History: Family History  Problem Relation Age of Onset   Cancer Father        prostate    Social History: Social History   Tobacco Use  Smoking Status Former   Current packs/day: 0.00   Average packs/day: 1 pack/day for 5.0 years (5.0 ttl pk-yrs)   Types: Cigarettes, Cigars   Start date: 05/16/1963   Quit date: 05/15/1968   Years since quitting: 55.2  Passive exposure: Never  Smokeless Tobacco Never  Tobacco Comments   Former smoker 06/20/23   Social History   Substance and Sexual Activity  Alcohol Use No   Alcohol/week: 0.0 standard drinks of alcohol   Social History   Substance and Sexual Activity  Drug Use Never    Allergies: Allergies  Allergen Reactions   Codeine Swelling   Lipitor [Atorvastatin] Other (See Comments)    Muscle aches    Invokana [Canagliflozin] Other (See Comments)    Stomach upset    Losartan Nausea And Vomiting   Roxicodone [Oxycodone]  Itching   Vicodin [Hydrocodone-Acetaminophen] Itching    Medications: Current Outpatient Medications  Medication Sig Dispense Refill   amLODipine (NORVASC) 5 MG tablet TAKE 1 TABLET EVERY DAY 90 tablet 3   apixaban (ELIQUIS) 5 MG TABS tablet Take 1 tablet (5 mg total) by mouth 2 (two) times daily. 60 tablet 6   ascorbic acid (VITAMIN C) 500 MG tablet Take 500 mg by mouth daily.     calcium carbonate (OSCAL) 1500 (600 Ca) MG TABS tablet Take 600 mg of elemental calcium by mouth daily.     cholecalciferol (VITAMIN D3) 25 MCG (1000 UNIT) tablet Take 1,000 Units by mouth daily.     Insulin Aspart, w/Niacinamide, (FIASP) 100 UNIT/ML SOLN 100 Units. Insulin pump     insulin glargine (LANTUS) 100 UNIT/ML injection Inject 16 Units into the skin as needed (If pump goes out).     Insulin Human (INSULIN PUMP) SOLN Inject into the skin continuous. Fiasp 77 units     KRILL OIL PO Take 400 mg by mouth daily.     loratadine (CLARITIN) 10 MG tablet Take 10 mg by mouth daily.     Multiple Vitamin (MULTIVITAMIN WITH MINERALS) TABS Take 1 tablet by mouth daily. Centrum     ONE TOUCH ULTRA TEST test strip 1 each daily. Pt says 2-3 times daily  3   pantoprazole (PROTONIX) 40 MG tablet TAKE 1 TABLET BY MOUTH DAILY 90 tablet 3   Potassium 99 MG TABS Take 99 mg by mouth daily.     pyridOXINE (VITAMIN B6) 50 MG tablet Take 50 mg by mouth daily.     ramipril (ALTACE) 5 MG capsule Take 1 capsule (5 mg total) by mouth daily. 90 capsule 3   traMADol (ULTRAM) 50 MG tablet Take 1 tablet (50 mg total) by mouth every 6 (six) hours as needed. 30 tablet 0   vitamin E 400 UNIT capsule Take 400 Units by mouth in the morning.     zinc gluconate 50 MG tablet Take 50 mg by mouth in the morning.     Abatacept (ORENCIA IV) Inject 750 mg into the vein every 28 (twenty-eight) days. (Patient not taking: Reported on 08/15/2023)     pravastatin (PRAVACHOL) 40 MG tablet Take 40 mg by mouth daily. (Patient not taking: Reported on 08/15/2023)      No current facility-administered medications for this visit.    Review of Systems: GENERAL: negative for malaise, night sweats HEENT: No changes in hearing or vision, no nose bleeds or other nasal problems. NECK: Negative for lumps, goiter, pain and significant neck swelling RESPIRATORY: Negative for cough, wheezing CARDIOVASCULAR: Negative for chest pain, leg swelling, palpitations, orthopnea GI: SEE HPI MUSCULOSKELETAL: Negative for joint pain or swelling, back pain, and muscle pain. SKIN: Negative for lesions, rash HEMATOLOGY Negative for prolonged bleeding, bruising easily, and swollen nodes. ENDOCRINE: Negative for cold or heat intolerance, polyuria, polydipsia and goiter. NEURO: negative  for tremor, gait imbalance, syncope and seizures. The remainder of the review of systems is noncontributory.   Physical Exam: BP (!) 141/75 (BP Location: Left Arm, Patient Position: Sitting, Cuff Size: Normal)   Pulse (!) 51   Temp 97.8 F (36.6 C) (Temporal)   Ht 5\' 7"  (1.702 m)   Wt 206 lb 9.6 oz (93.7 kg)   BMI 32.36 kg/m  GENERAL: The patient is AO x3, in no acute distress. HEENT: Head is normocephalic and atraumatic. EOMI are intact. Mouth is well hydrated and without lesions. NECK: Supple. No masses LUNGS: Clear to auscultation. No presence of rhonchi/wheezing/rales. Adequate chest expansion HEART: RRR, normal s1 and s2. ABDOMEN: Soft, nontender, no guarding, no peritoneal signs, and nondistended. BS +. No masses.   Imaging/Labs:      Latest Ref Rng & Units 08/10/2023   11:09 AM 07/03/2023    8:09 AM 06/05/2023    3:17 PM  CBC  WBC 4.0 - 10.5 K/uL 6.2  5.4  7.3   Hemoglobin 13.0 - 17.0 g/dL 29.5  62.1  30.8   Hematocrit 39.0 - 52.0 % 40.1  38.0  37.9   Platelets 150 - 400 K/uL 250  228  277     Ultrasound 08/02/2023 IMPRESSION: Sludge in the gallbladder. No sonographic evidence of acute cholecystitis.   I personally reviewed and interpreted the available labs,  imaging and endoscopic files.  Impression and Plan:  Joel Ju Nickerson Sr. is a 78 y.o. male with A-fib on Eliquis, coronary artery disease, bradycardia, hypertension, hyperlipidemia who presents for evaluation of elevated liver enzymes   #Elevated liver enzymes Fibrosis 4 Score = 1.13 Fib-4 interpretation is not validated for people under 35 or over 45 years of age. However, scores under 2.0 are generally considered low risk.  Mixed pattern elevation liver enzymes  Patient initially had mostly mixed pattern elevation liver enzymes but has trended down and now mostly cholestatic in nature  Patient has been taking Tylenol less than 4 g a day and Tylenol toxicity usually causes hepatocellular pattern elevation liver enzymes  Regardless I do see patient had transient elevation of liver enzymes in 2023 as well  On exam patient does not have signs of advanced chronic liver disease, no splenomegaly, ascites, spider angiomas, palmar eythema   Ultrasound with biliary sludge and a passed stone can also cause this liver enzymes but patient denies any abdominal pain  Recs:  Would advise to take Tylenol less than 2 g a day as he might be susceptible to toxicity even on nontoxic levels Avoid all over-the-counter supplements Would obtain baseline viral hepatitis profile and autoimmune serologies If Liver enzymes continue to be elevated will obtain MRI MRCP, reports knee prosthesis is MRI compatible and we will ask to bring documentation regarding that if MRI is needed  [facility] Colonoscopy performed last year and hence patient is up-to-date  All questions were answered.      [name], MD Gastroenterology and Hepatology [facility] Gastroenterology   This chart has been completed using [name] Dictation software, and while attempts have been made to ensure accuracy , certain words and phrases [place] not be transcribed as intended

## 2023-08-20 ENCOUNTER — Ambulatory Visit (HOSPITAL_COMMUNITY)
Admission: RE | Admit: 2023-08-20 | Discharge: 2023-08-20 | Disposition: A | Discharge status: Home / Self Care | Source: Ambulatory Visit | Attending: Gastroenterology | Admitting: Gastroenterology

## 2023-08-20 ENCOUNTER — Telehealth: Payer: Self-pay | Admitting: Orthopaedic Surgery

## 2023-08-20 ENCOUNTER — Other Ambulatory Visit: Payer: Self-pay | Admitting: Orthopaedic Surgery

## 2023-08-20 ENCOUNTER — Other Ambulatory Visit (INDEPENDENT_AMBULATORY_CARE_PROVIDER_SITE_OTHER): Payer: Self-pay | Admitting: Gastroenterology

## 2023-08-20 ENCOUNTER — Other Ambulatory Visit (HOSPITAL_COMMUNITY): Payer: Self-pay | Admitting: Gastroenterology

## 2023-08-20 ENCOUNTER — Encounter (HOSPITAL_COMMUNITY): Payer: Self-pay

## 2023-08-20 ENCOUNTER — Other Ambulatory Visit (INDEPENDENT_AMBULATORY_CARE_PROVIDER_SITE_OTHER): Payer: Self-pay | Admitting: *Deleted

## 2023-08-20 DIAGNOSIS — R748 Abnormal levels of other serum enzymes: Secondary | ICD-10-CM | POA: Insufficient documentation

## 2023-08-20 MED ORDER — GADOBUTROL 1 MMOL/ML IV SOLN
9.4000 mL | Freq: Once | INTRAVENOUS | Status: AC | PRN
  Administered 2023-08-20: 9.4 mL via INTRAVENOUS

## 2023-08-20 MED ORDER — TRAMADOL HCL 50 MG PO TABS: 50.0000 mg | ORAL_TABLET | Freq: Two times a day (BID) | ORAL | 0 refills | Status: DC | PRN

## 2023-08-20 NOTE — Telephone Encounter (Signed)
 Pt's wife Joel Jones called requesting a refill of tramadol. Pt did  to  have his surgery and need pain medication. Please send to [facility] on [address] . Pt phone number is [phone]

## 2023-08-20 NOTE — Progress Notes (Signed)
 Hi Wendy ,  Can you please call the patient and tell the patient the lab work  show that the liver enzymes continue to rise , although its reassuring that the liver is functioning well  I recommend to have an urgent MRI to make sure there is no stone in the bile ducts stuck and repeat labs again in 2-3 days   I recommend avoid any new medications started and stop all over the counter supplements   I recommend if he notice any jaundice ( yellowing of the skin or eyes) , generalized itching or any confusion , fever or chills , than come to the ED  Toniann Fail please arrange Repeat CMP and INR in 2-3 days ===================================  Hi Tanya  Can you please arrange STAT MRI Abdomen with MRCP , Diagnosis : elevated liver enzymes   Thanks,  [name], MD Gastroenterology and Hepatology [facility] Gastroenterology ===================================================  Liver enzymes have trended up ALT 503 AST 453 Alk phos 468 T. bili 1.6 INR 1.1, intact synthetic function of liver Iron saturation 75 (elevated) ferritin 331 Negative AMA, ASMA Hep A nonimmune Hep B non exposed nonimmune Hepatitis C negative HIV negative  Ultrasound with cholelithiasis  Will obtain MRI/MRCP to r/o choledocholithiasis  Will obtain HFE testing in future

## 2023-08-23 ENCOUNTER — Encounter: Payer: Self-pay | Admitting: General Surgery

## 2023-08-23 ENCOUNTER — Ambulatory Visit (INDEPENDENT_AMBULATORY_CARE_PROVIDER_SITE_OTHER): Admitting: General Surgery

## 2023-08-23 ENCOUNTER — Ambulatory Visit: Payer: Self-pay | Admitting: General Surgery

## 2023-08-23 VITALS — BP 136/65 | HR 56 | Temp 98.2°F | Resp 16 | Ht 67.0 in | Wt 206.0 lb

## 2023-08-23 DIAGNOSIS — K838 Other specified diseases of biliary tract: Secondary | ICD-10-CM | POA: Diagnosis not present

## 2023-08-24 ENCOUNTER — Encounter: Payer: Self-pay | Admitting: *Deleted

## 2023-08-24 ENCOUNTER — Telehealth: Payer: Self-pay

## 2023-08-24 NOTE — Telephone Encounter (Signed)
 Pharmacy please advise on holding Eliquis prior to robotic assisted laparoscopic cholecystectomy biopsy scheduled for TBD. Thank you.

## 2023-08-24 NOTE — Progress Notes (Signed)
 Joel Sainz Brodman Sr.; [id number]; 1946/01/15   HPI Patient is a 78 year old white male who was referred to my care by Dr. Tasia Catchings of gastroenterology for evaluation treatment of biliary sludge and elevated liver enzyme test.  Patient has had intermittent episodes of right upper quadrant abdominal pain and bloating.  He was recently found to have elevated liver enzyme test.  An MRCP was performed which was negative for choledocholithiasis.  His hepatitis screen was negative.  Patient does have fatty food intolerance.  He denies any fever or chills.  Patient is on Eliquis for history of atrial fibrillation, though he did get ablated in January of this year. Past Medical History:  Diagnosis Date   A-fib [facility])    AKI (acute kidney injury) (HCC) 05/18/2021   Anemia    Arthritis    "all over"   Bradycardia    Chronic bronchitis (HCC)    "get it just about q yr" (03/17/2014)   Daily headache    "here lately" (03/17/2014); relates to sinuses   GERD (gastroesophageal reflux disease)    Hard of hearing    hearing aides bilat   Hiatal hernia    History of blood transfusion 2008   "related to OR"   History of kidney stones    Hyperlipidemia    Hypertension    Pneumonia 1972 X 1   Rheumatoid arthritis (HCC)    Type II diabetes mellitus (HCC)    Wears glasses     Past Surgical History:  Procedure Laterality Date   ATRIAL FIBRILLATION ABLATION N/A 05/23/2023   Procedure: ATRIAL FIBRILLATION ABLATION;  Surgeon: Maurice Small, MD;  [facility];  Service: Cardiovascular;  Laterality: N/A;   broken finger      surgical repaired left hand 2nd finger    CARDIOVERSION N/A 11/08/2022   Procedure: CARDIOVERSION;  Surgeon: Maisie Fus, MD;  [facility];  Service: Cardiovascular;  Laterality: N/A;   cyst removed      per left knee/posteriorly   CYSTECTOMY Left 2009   "behind knee"   INGUINAL HERNIA REPAIR Right ?2010   JOINT REPLACEMENT     KNEE ARTHROSCOPY  Bilateral 1980's   PARTIAL KNEE ARTHROPLASTY Right 08/27/2015   Procedure: RIGHT UNICOMPARTMENTAL KNEE ARTHROPLASTY;  Surgeon: Kathryne Hitch, MD;  Location: [facility];  Service: Orthopedics;  Laterality: Right;   REVISION TOTAL KNEE ARTHROPLASTY Left 2008   SHOULDER SURGERY Left 2019   TOTAL KNEE ARTHROPLASTY Left 2003    Family History  Problem Relation Age of Onset   Cancer Father        prostate    Current Outpatient Medications on File Prior to Visit  Medication Sig Dispense Refill   amLODipine (NORVASC) 5 MG tablet TAKE 1 TABLET EVERY DAY 90 tablet 3   apixaban (ELIQUIS) 5 MG TABS tablet Take 1 tablet (5 mg total) by mouth 2 (two) times daily. 60 tablet 6   ascorbic acid (VITAMIN C) 500 MG tablet Take 500 mg by mouth daily.     calcium carbonate (OSCAL) 1500 (600 Ca) MG TABS tablet Take 600 mg of elemental calcium by mouth daily.     cholecalciferol (VITAMIN D3) 25 MCG (1000 UNIT) tablet Take 1,000 Units by mouth daily.     Insulin Aspart, w/Niacinamide, (FIASP) 100 UNIT/ML SOLN 100 Units. Insulin pump     insulin glargine (LANTUS) 100 UNIT/ML injection Inject 16 Units into the skin as needed (If pump goes out).     Insulin  Human (INSULIN PUMP) SOLN Inject into the skin continuous. Fiasp 77 units     KRILL OIL PO Take 400 mg by mouth daily.     loratadine (CLARITIN) 10 MG tablet Take 10 mg by mouth daily.     Multiple Vitamin (MULTIVITAMIN WITH MINERALS) TABS Take 1 tablet by mouth daily. Centrum     ONE TOUCH ULTRA TEST test strip 1 each daily. Pt says 2-3 times daily  3   pantoprazole (PROTONIX) 40 MG tablet TAKE 1 TABLET BY MOUTH DAILY 90 tablet 3   Potassium 99 MG TABS Take 99 mg by mouth daily.     pyridOXINE (VITAMIN B6) 50 MG tablet Take 50 mg by mouth daily.     ramipril (ALTACE) 5 MG capsule Take 1 capsule (5 mg total) by mouth daily. 90 capsule 3   traMADol (ULTRAM) 50 MG tablet Take 1-2 tablets (50-100 mg total) by mouth every 12 (twelve) hours as needed. 30  tablet 0   vitamin E 400 UNIT capsule Take 400 Units by mouth in the morning.     zinc gluconate 50 MG tablet Take 50 mg by mouth in the morning.     Abatacept (ORENCIA IV) Inject 750 mg into the vein every 28 (twenty-eight) days. (Patient not taking: Reported on 08/15/2023)     pravastatin (PRAVACHOL) 40 MG tablet Take 40 mg by mouth daily. (Patient not taking: Reported on 08/15/2023)     No current facility-administered medications on file prior to visit.    Allergies  Allergen Reactions   Codeine Swelling   Lipitor [Atorvastatin] Other (See Comments)    Muscle aches    Invokana [Canagliflozin] Other (See Comments)    Stomach upset    Losartan Nausea And Vomiting   Roxicodone [Oxycodone] Itching   Vicodin [Hydrocodone-Acetaminophen] Itching    Social History   Substance and Sexual Activity  Alcohol Use No   Alcohol/week: 0.0 standard drinks of alcohol    Social History   Tobacco Use  Smoking Status Former   Current packs/day: 0.00   Average packs/day: 1 pack/day for 5.0 years (5.0 ttl pk-yrs)   Types: Cigarettes, Cigars   Start date: 05/16/1963   Quit date: 05/15/1968   Years since quitting: 55.3   Passive exposure: Never  Smokeless Tobacco Never  Tobacco Comments   Former smoker 06/20/23    Review of Systems  Constitutional: Negative.   HENT: Negative.    Eyes: Negative.   Respiratory: Negative.    Cardiovascular: Negative.   Gastrointestinal:  Positive for heartburn.  Genitourinary:  Positive for frequency.  Musculoskeletal:  Positive for back pain and joint pain.  Skin: Negative.   Neurological: Negative.   Endo/Heme/Allergies: Negative.   Psychiatric/Behavioral: Negative.      Objective   Vitals:   08/23/23 1119  BP: 136/65  Pulse: (!) 56  Resp: 16  Temp: 98.2 F (36.8 C)  SpO2: 94%    Physical Exam Vitals reviewed.  Constitutional:      Appearance: Normal appearance. He is not ill-appearing.  HENT:     Head: Normocephalic and atraumatic.   Eyes:     General: No scleral icterus. Cardiovascular:     Rate and Rhythm: Normal rate and regular rhythm.     Heart sounds: Normal heart sounds. No murmur heard.    No friction rub. No gallop.  Pulmonary:     Effort: Pulmonary effort is normal. No respiratory distress.     Breath sounds: Normal breath sounds. No stridor. No wheezing, rhonchi  or rales.  Abdominal:     General: Bowel sounds are normal. There is no distension.     Palpations: Abdomen is soft. There is no mass.     Tenderness: There is no abdominal tenderness. There is no guarding or rebound.     Hernia: No hernia is present.  Skin:    General: Skin is warm and dry.  Neurological:     Mental Status: He is alert and oriented to person, place, and time.   GI notes reviewed, liver enzyme test report reviewed.  MRCP report reviewed.  Assessment  Biliary colic secondary to biliary sludge, transaminitis, chronic anticoagulation with Eliquis Plan  Patient will be seeing his cardiologist next week.  Benscoter be taken off of Eliquis as he has been in normal sinus rhythm.  That being said, patient needs a robotic assisted laparoscopic cholecystectomy with liver biopsy.  This will be scheduled after his cardiology visit.  Should he continue to be on Eliquis, he will need to hold that for 2 days prior to the procedure.  The risks and benefits of the procedure including bleeding, infection, hepatobiliary injury, and the possibility of an open procedure were fully explained to the patient, who gave informed consent.

## 2023-08-24 NOTE — Telephone Encounter (Signed)
 Patient with diagnosis of atrial fibrillation on Eliquis for anticoagulation.    Procedure:   XI ROBOTIC ASSISTED LAPAROSCOPIC CHOLECYSTECTOMY LIVER BIOPSY   Date of Surgery:  Clearance TBD   CHA2DS2-VASc Score = 5   This indicates a 7.2% annual risk of stroke. The patient's score is based upon: CHF History: 0 HTN History: 1 Diabetes History: 1 Stroke History: 0 Vascular Disease History: 1 (CAD/aortic atherosclerosis) Age Score: 2 Gender Score: 0   CrCl 80 Platelet count 250  Per office protocol, patient can hold Eliquis for 2 days prior to procedure.   Patient will not need bridging with Lovenox (enoxaparin) around procedure.  **This guidance is not considered finalized until pre-operative APP has relayed final recommendations.**

## 2023-08-24 NOTE — Telephone Encounter (Signed)
   Name: Joel Keeling Wellnitz Sr.  DOB: 04/24/1946  MRN: [id number]  Primary Cardiologist: Olga Millers, MD  Chart reviewed as part of pre-operative protocol coverage. The patient has an upcoming visit scheduled with Canary Brim, NP on 08/28/2023 at which time clearance can be addressed in case there are any issues that would impact surgical recommendations.   I added preop FYI to appointment note so that provider is aware to address at time of outpatient visit.  Per office protocol the cardiology provider should forward their finalized clearance decision and recommendations regarding antiplatelet therapy to the requesting party below.    I will route this message as FYI to requesting party and remove this message from the preop box as separate preop APP input not needed at this time.   Please call with any questions.  Napoleon Form, Leodis Rains, NP  08/24/2023, 11:41 AM

## 2023-08-24 NOTE — Telephone Encounter (Signed)
   Pre-operative Risk Assessment    Patient Name: Joel Hagood Wolfgang Sr.  DOB: 09/11/1945 MRN: [id number]   Date of last office visit: 07/16/23 Olga Millers, MD Date of next office visit: 08/28/23 Canary Brim, NP   Request for Surgical Clearance    Procedure:   XI ROBOTIC ASSISTED LAPAROSCOPIC CHOLECYSTECTOMY LIVER BIOPSY  Date of Surgery:  Clearance TBD                                Surgeon:  Franky Macho, MD Surgeon's Group or Practice Name:  [facility] Phone number:  [phone] Fax number:  [phone]   Type of Clearance Requested:   - Medical  - Pharmacy:  Hold Apixaban (Eliquis) 2 DAYS   Type of Anesthesia:  Not Indicated   Additional requests/questions:    Signed, Marlow Baars   08/24/2023, 11:00 AM

## 2023-08-25 LAB — IRON,TIBC AND FERRITIN PANEL
Ferritin: 331 ng/mL (ref 30–400)
Iron Saturation: 75 % — ABNORMAL HIGH (ref 15–55)
Iron: 224 ug/dL — ABNORMAL HIGH (ref 38–169)
Total Iron Binding Capacity: 297 ug/dL (ref 250–450)
UIBC: 73 ug/dL — ABNORMAL LOW (ref 111–343)

## 2023-08-25 LAB — ENA+DNA/DS+ANTICH+CENTRO+FA...
Anti JO-1: <0.2 AI (ref 0.0–0.9)
Centromere Ab Screen: <0.2 AI (ref 0.0–0.9)
Chromatin Ab SerPl-aCnc: <0.2 AI (ref 0.0–0.9)
ENA RNP Ab: <0.2 AI (ref 0.0–0.9)
ENA SM Ab Ser-aCnc: <0.2 AI (ref 0.0–0.9)
ENA SSA (RO) Ab: <0.2 AI (ref 0.0–0.9)
ENA SSB (LA) Ab: <0.2 AI (ref 0.0–0.9)
Scleroderma (Scl-70) (ENA) Antibody, IgG: <0.2 AI (ref 0.0–0.9)
Speckled Pattern: 1:320 {titer} — ABNORMAL HIGH
dsDNA Ab: 2 [IU]/mL (ref 0–9)

## 2023-08-25 LAB — COMPREHENSIVE METABOLIC PANEL WITH GFR
ALT: 503 IU/L (ref 0–44)
AST: 453 IU/L — ABNORMAL HIGH (ref 0–40)
Albumin: 4.5 g/dL (ref 3.8–4.8)
Alkaline Phosphatase: 468 IU/L — ABNORMAL HIGH (ref 44–121)
BUN/Creatinine Ratio: 16 (ref 10–24)
BUN: 17 mg/dL (ref 8–27)
Bilirubin Total: 1.6 mg/dL — ABNORMAL HIGH (ref 0.0–1.2)
CO2: 22 mmol/L (ref 20–29)
Calcium: 9.4 mg/dL (ref 8.6–10.2)
Chloride: 99 mmol/L (ref 96–106)
Creatinine, Ser: 1.04 mg/dL (ref 0.76–1.27)
Globulin, Total: 1.8 g/dL (ref 1.5–4.5)
Glucose: 210 mg/dL — ABNORMAL HIGH (ref 70–99)
Potassium: 3.9 mmol/L (ref 3.5–5.2)
Sodium: 142 mmol/L (ref 134–144)
Total Protein: 6.3 g/dL (ref 6.0–8.5)
eGFR: 74 mL/min/{1.73_m2} (ref >59)

## 2023-08-25 LAB — HEPATITIS A ANTIBODY, TOTAL: hep A Total Ab: NEGATIVE

## 2023-08-25 LAB — ANA: ANA Titer 1: POSITIVE — AB

## 2023-08-25 LAB — PROTIME-INR
INR: 1.1 (ref 0.9–1.2)
Prothrombin Time: 12.4 s — ABNORMAL HIGH (ref 9.1–12.0)

## 2023-08-25 LAB — HIV ANTIBODY (ROUTINE TESTING W REFLEX): HIV Screen 4th Generation wRfx: NONREACTIVE

## 2023-08-25 LAB — HEPATITIS C ANTIBODY: Hep C Virus Ab: NONREACTIVE

## 2023-08-25 LAB — HEPATITIS B SURFACE ANTIBODY,QUALITATIVE: Hep B Surface Ab, Qual: NONREACTIVE

## 2023-08-25 LAB — HEPATITIS B CORE ANTIBODY, TOTAL: Hep B Core Total Ab: NEGATIVE

## 2023-08-25 LAB — HEPATITIS B SURFACE ANTIGEN: Hepatitis B Surface Ag: NEGATIVE

## 2023-08-25 LAB — MITOCHONDRIAL ANTIBODIES: Mitochondrial Ab: <20 U (ref 0.0–20.0)

## 2023-08-25 LAB — ANTI-SMOOTH MUSCLE ANTIBODY, IGG: Smooth Muscle Ab: 10 U (ref 0–19)

## 2023-08-27 ENCOUNTER — Ambulatory Visit: Payer: Medicare Other | Admitting: Student

## 2023-08-28 ENCOUNTER — Encounter (HOSPITAL_COMMUNITY)

## 2023-08-28 ENCOUNTER — Ambulatory Visit: Attending: Pulmonary Disease | Admitting: Pulmonary Disease

## 2023-08-28 ENCOUNTER — Other Ambulatory Visit: Payer: Self-pay | Admitting: Internal Medicine

## 2023-08-28 ENCOUNTER — Encounter: Payer: Self-pay | Admitting: Pulmonary Disease

## 2023-08-28 VITALS — BP 136/74 | HR 50 | Ht 67.0 in | Wt 194.0 lb

## 2023-08-28 DIAGNOSIS — R001 Bradycardia, unspecified: Secondary | ICD-10-CM

## 2023-08-28 DIAGNOSIS — Z7901 Long term (current) use of anticoagulants: Secondary | ICD-10-CM | POA: Diagnosis not present

## 2023-08-28 DIAGNOSIS — I10 Essential (primary) hypertension: Secondary | ICD-10-CM

## 2023-08-28 DIAGNOSIS — I4819 Other persistent atrial fibrillation: Secondary | ICD-10-CM

## 2023-08-28 MED ORDER — AMLODIPINE BESYLATE 5 MG PO TABS: 5.0000 mg | ORAL_TABLET | Freq: Every day | ORAL | 3 refills | Status: DC

## 2023-08-28 MED ORDER — RAMIPRIL 5 MG PO CAPS: 5.0000 mg | ORAL_CAPSULE | Freq: Every day | ORAL | 3 refills | Status: DC

## 2023-08-28 NOTE — Telephone Encounter (Signed)
 Copied from CRM [id number]. Topic: Clinical - Medication Refill >> Aug 28, 2023 11:37 AM Turkey B wrote: Most Recent Primary Care Visit:  Provider: [name]  Department: [facility]  Visit Type: OFFICE VISIT  Date: 07/18/2023  Medication: ramipril (ALTACE) 5 MG/amlodipine 5mg   Has the patient contacted their pharmacy? yes (Agent: If yes, when and what did the pharmacy advise?)contact pcp  Is this the correct pharmacy for this prescription? yes This is the patient's preferred pharmacy:   [facility] [id number] - [place], [place] - [address] [facility] [address] [place] [address] Phone: [phone] Fax: [phone]   Has the prescription been filled recently? no Is the patient out of the medication? yes  Has the patient been seen for an appointment in the last year OR does the patient have an upcoming appointment? yes  Can we respond through MyChart? yes  Agent: Please be advised that Rx refills Kamp take up to 3 business days. We ask that you follow-up with your pharmacy.

## 2023-08-28 NOTE — Progress Notes (Signed)
 Electrophysiology Office Note:   Date:  08/28/2023  ID:  Barbara Cower Ange Sr., DOB 07/04/1945, MRN [id number]  Primary Cardiologist: Olga Millers, MD Primary Heart Failure: None Electrophysiologist: Maurice Small, MD      History of Present Illness:   Barbara Cower Zenker Sr. is a 78 y.o. male with h/o AF, bradycardia, RA, DM II  seen today for cardiac clearance for XI ROBOTIC ASSISTED LAPAROSCOPIC CHOLECYSTECTOMY LIVER BIOPSY.   Since last being seen in our clinic the patient reports he has been doing well since his ablation.  He has had no further episodes of atrial fibrillation/palpitations.  He feels as though he has a difficult time remembering to take his eliquis in the evenings this last week as his wife has been ill and he has been caring for her.  While his wife was in the hospital, he developed abdominal pain and visited his PCP.  His LFTs were elevated.  Workup led to the discovery of gallstones.  The patient is planning to have laparoscopic cholecystectomy and liver biopsy in [place].  He denies chest pain, palpitations, dyspnea, PND, orthopnea, nausea, vomiting, dizziness, syncope, edema, weight gain, or early satiety.  Review of systems complete and found to be negative unless listed in HPI.   EP Information / Studies Reviewed:    EKG is ordered today. Personal review as below.  EKG Interpretation Date/Time:  Tuesday August 28 2023 15:40:19 EDT Ventricular Rate:  50 PR Interval:  170 QRS Duration:  88 QT Interval:  434 QTC Calculation: 395 R Axis:   37  Text Interpretation: Sinus bradycardia Confirmed by Canary Brim ([id number]) on 08/28/2023 3:50:31 PM   Studies:  ECHO 11/2022 > LVEF 60-65%, mild bi-atrial dilation       Arrhythmia / AAD AF > initial dx 09/2022  AAD limited due to SB & coronary calcifications    Risk Assessment/Calculations:    CHA2DS2-VASc Score = 5   This indicates a 7.2% annual risk of stroke. The patient's score is based upon: CHF History: 0 HTN  History: 1 Diabetes History: 1 Stroke History: 0 Vascular Disease History: 1 (CAD/aortic atherosclerosis) Age Score: 2 Gender Score: 0             Physical Exam:   VS:  BP 136/74   Pulse (!) 50   Ht 5\' 7"  (1.702 m)   Wt 194 lb (88 kg)   SpO2 96%   BMI 30.38 kg/m    Wt Readings from Last 3 Encounters:  08/28/23 194 lb (88 kg)  08/23/23 206 lb (93.4 kg)  08/15/23 206 lb 9.6 oz (93.7 kg)     GEN: Well nourished, well developed in no acute distress NECK: No JVD; No carotid bruits CARDIAC: Regular rate and rhythm, no murmurs, rubs, gallops RESPIRATORY:  Clear to auscultation without rales, wheezing or rhonchi  ABDOMEN: Soft, non-tender, non-distended EXTREMITIES:  No edema; No deformity   ASSESSMENT AND PLAN:    Persistent Atrial Fibrillation  CHA2DS2-VASc 5. Normal LVEF. S/p ablation 05/23/23  -EKG with NSR    -no AF burden since ablation   -OAC for stroke prophylaxis    Secondary Hypercoagulable State  -continue Eliquis 5mg  BID  -reviewed strategies for ensuring compliance for Eliquis such as setting alarm or carrying his medication with him  Sinus Bradycardia  -monitor, avoid AV nodal blockers   Hypertension  -well controlled on current regimen     Pre-Operative Cardiac Clearance  Procedure: XI ROBOTIC ASSISTED LAPAROSCOPIC CHOLECYSTECTOMY LIVER BIOPSY  Date of Surgery:  Clearance TBD                                  Surgeon:  [name], MD Surgeon's Group or Practice Name:  [facility] Phone number:  [phone] Fax number:  [phone] Type of Clearance Requested:   - Medical  - Pharmacy:  Hold Apixaban (Eliquis) 2 DAYS Type of Anesthesia:  Not Indicated       Mr. Callaway perioperative risk of a major cardiac event is 6.6% according to the Revised Cardiac Risk Index (RCRI).  Therefore, he is considered moderate risk for laparoscopic / high risk if open abdomen for perioperative complications.   His functional capacity  is good at 7.25 METs according to the Duke Activity Status Index (DASI).  Recommendations: According to ACC/AHA guidelines, no further cardiovascular testing needed.  The patient Reczek proceed to surgery at acceptable risk.    Antiplatelet and/or Anticoagulation Recommendations:  Eliquis (Apixaban) can be held for 2 days prior to surgery.  Please resume post op when felt to be safe.      Follow up with Dr. Arlester Ladd or EP APP in 6 months  Signed, Creighton Doffing, NP-C, AGACNP-BC [facility] - Electrophysiology  08/28/2023, 4:56 PM

## 2023-08-28 NOTE — Patient Instructions (Signed)
 Medication Instructions:  Your physician recommends that you continue on your current medications as directed. Please refer to the Current Medication list given to you today.  *If you need a refill on your cardiac medications before your next appointment, please call your pharmacy*  Lab Work: None ordered If you have labs (blood work) drawn today and your tests are completely normal, you will receive your results only by: MyChart Message (if you have MyChart) OR A paper copy in the mail If you have any lab test that is abnormal or we need to change your treatment, we will call you to review the results.  Follow-Up: At [facility], you and your health needs are our priority.  As part of our continuing mission to provide you with exceptional heart care, our providers are all part of one team.  This team includes your primary Cardiologist (physician) and Advanced Practice Providers or APPs (Physician Assistants and Nurse Practitioners) who all work together to provide you with the care you need, when you need it.  Your next appointment:   6 month(s)  Provider:   Marlane Silver, MD       1st Floor: - Lobby - Registration  - Pharmacy  - Lab - Cafe  2nd Floor: - PV Lab - Diagnostic Testing (echo, CT, nuclear med)  3rd Floor: - Vacant  4th Floor: - TCTS (cardiothoracic surgery) - AFib Clinic - Structural Heart Clinic - Vascular Surgery  - Vascular Ultrasound  5th Floor: - HeartCare Cardiology (general and EP) - Clinical Pharmacy for coumadin, hypertension, lipid, weight-loss medications, and med management appointments    Valet parking services will be available as well.

## 2023-08-29 ENCOUNTER — Telehealth: Payer: Self-pay | Admitting: *Deleted

## 2023-08-29 DIAGNOSIS — K838 Other specified diseases of biliary tract: Secondary | ICD-10-CM

## 2023-08-29 DIAGNOSIS — R7989 Other specified abnormal findings of blood chemistry: Secondary | ICD-10-CM

## 2023-08-29 NOTE — Telephone Encounter (Signed)
 Received cardiac clearance to proceed with surgery.

## 2023-08-29 NOTE — H&P (Signed)
 Joel Willmore Giarratano Sr.; [id number]; 1945/11/24   HPI Patient is a 78 year old white male who was referred to my care by Dr. Tasia Catchings of gastroenterology for evaluation treatment of biliary sludge and elevated liver enzyme test.  Patient has had intermittent episodes of right upper quadrant abdominal pain and bloating.  He was recently found to have elevated liver enzyme test.  An MRCP was performed which was negative for choledocholithiasis.  His hepatitis screen was negative.  Patient does have fatty food intolerance.  He denies any fever or chills.  Patient is on Eliquis for history of atrial fibrillation, though he did get ablated in January of this year. Past Medical History:  Diagnosis Date   A-fib [facility])    AKI (acute kidney injury) (HCC) 05/18/2021   Anemia    Arthritis    "all over"   Bradycardia    Chronic bronchitis (HCC)    "get it just about q yr" (03/17/2014)   Daily headache    "here lately" (03/17/2014); relates to sinuses   GERD (gastroesophageal reflux disease)    Hard of hearing    hearing aides bilat   Hiatal hernia    History of blood transfusion 2008   "related to OR"   History of kidney stones    Hyperlipidemia    Hypertension    Pneumonia 1972 X 1   Rheumatoid arthritis (HCC)    Type II diabetes mellitus (HCC)    Wears glasses     Past Surgical History:  Procedure Laterality Date   ATRIAL FIBRILLATION ABLATION N/A 05/23/2023   Procedure: ATRIAL FIBRILLATION ABLATION;  Surgeon: Maurice Small, MD;  Location: [facility] INVASIVE CV LAB;  Service: Cardiovascular;  Laterality: N/A;   broken finger      surgical repaired left hand 2nd finger    CARDIOVERSION N/A 11/08/2022   Procedure: CARDIOVERSION;  Surgeon: Maisie Fus, MD;  Location: [facility] INVASIVE CV LAB;  Service: Cardiovascular;  Laterality: N/A;   cyst removed      per left knee/posteriorly   CYSTECTOMY Left 2009   "behind knee"   INGUINAL HERNIA REPAIR Right ?2010   JOINT REPLACEMENT     KNEE ARTHROSCOPY  Bilateral 1980's   PARTIAL KNEE ARTHROPLASTY Right 08/27/2015   Procedure: RIGHT UNICOMPARTMENTAL KNEE ARTHROPLASTY;  Surgeon: Kathryne Hitch, MD;  Location: [facility] ORS;  Service: Orthopedics;  Laterality: Right;   REVISION TOTAL KNEE ARTHROPLASTY Left 2008   SHOULDER SURGERY Left 2019   TOTAL KNEE ARTHROPLASTY Left 2003    Family History  Problem Relation Age of Onset   Cancer Father        prostate    Current Outpatient Medications on File Prior to Visit  Medication Sig Dispense Refill   amLODipine (NORVASC) 5 MG tablet TAKE 1 TABLET EVERY DAY 90 tablet 3   apixaban (ELIQUIS) 5 MG TABS tablet Take 1 tablet (5 mg total) by mouth 2 (two) times daily. 60 tablet 6   ascorbic acid (VITAMIN C) 500 MG tablet Take 500 mg by mouth daily.     calcium carbonate (OSCAL) 1500 (600 Ca) MG TABS tablet Take 600 mg of elemental calcium by mouth daily.     cholecalciferol (VITAMIN D3) 25 MCG (1000 UNIT) tablet Take 1,000 Units by mouth daily.     Insulin Aspart, w/Niacinamide, (FIASP) 100 UNIT/ML SOLN 100 Units. Insulin pump     insulin glargine (LANTUS) 100 UNIT/ML injection Inject 16 Units into the skin as needed (If pump goes out).     Insulin  Human (INSULIN PUMP) SOLN Inject into the skin continuous. Fiasp 77 units     KRILL OIL PO Take 400 mg by mouth daily.     loratadine (CLARITIN) 10 MG tablet Take 10 mg by mouth daily.     Multiple Vitamin (MULTIVITAMIN WITH MINERALS) TABS Take 1 tablet by mouth daily. Centrum     ONE TOUCH ULTRA TEST test strip 1 each daily. Pt says 2-3 times daily  3   pantoprazole (PROTONIX) 40 MG tablet TAKE 1 TABLET BY MOUTH DAILY 90 tablet 3   Potassium 99 MG TABS Take 99 mg by mouth daily.     pyridOXINE (VITAMIN B6) 50 MG tablet Take 50 mg by mouth daily.     ramipril (ALTACE) 5 MG capsule Take 1 capsule (5 mg total) by mouth daily. 90 capsule 3   traMADol (ULTRAM) 50 MG tablet Take 1-2 tablets (50-100 mg total) by mouth every 12 (twelve) hours as needed. 30  tablet 0   vitamin E 400 UNIT capsule Take 400 Units by mouth in the morning.     zinc gluconate 50 MG tablet Take 50 mg by mouth in the morning.     Abatacept (ORENCIA IV) Inject 750 mg into the vein every 28 (twenty-eight) days. (Patient not taking: Reported on 08/15/2023)     pravastatin (PRAVACHOL) 40 MG tablet Take 40 mg by mouth daily. (Patient not taking: Reported on 08/15/2023)     No current facility-administered medications on file prior to visit.    Allergies  Allergen Reactions   Codeine Swelling   Lipitor [Atorvastatin] Other (See Comments)    Muscle aches    Invokana [Canagliflozin] Other (See Comments)    Stomach upset    Losartan Nausea And Vomiting   Roxicodone [Oxycodone] Itching   Vicodin [Hydrocodone-Acetaminophen] Itching    Social History   Substance and Sexual Activity  Alcohol Use No   Alcohol/week: 0.0 standard drinks of alcohol    Social History   Tobacco Use  Smoking Status Former   Current packs/day: 0.00   Average packs/day: 1 pack/day for 5.0 years (5.0 ttl pk-yrs)   Types: Cigarettes, Cigars   Start date: 05/16/1963   Quit date: 05/15/1968   Years since quitting: 55.3   Passive exposure: Never  Smokeless Tobacco Never  Tobacco Comments   Former smoker 06/20/23    Review of Systems  Constitutional: Negative.   HENT: Negative.    Eyes: Negative.   Respiratory: Negative.    Cardiovascular: Negative.   Gastrointestinal:  Positive for heartburn.  Genitourinary:  Positive for frequency.  Musculoskeletal:  Positive for back pain and joint pain.  Skin: Negative.   Neurological: Negative.   Endo/Heme/Allergies: Negative.   Psychiatric/Behavioral: Negative.      Objective   Vitals:   08/23/23 1119  BP: 136/65  Pulse: (!) 56  Resp: 16  Temp: 98.2 F (36.8 C)  SpO2: 94%    Physical Exam Vitals reviewed.  Constitutional:      Appearance: Normal appearance. He is not ill-appearing.  HENT:     Head: Normocephalic and atraumatic.   Eyes:     General: No scleral icterus. Cardiovascular:     Rate and Rhythm: Normal rate and regular rhythm.     Heart sounds: Normal heart sounds. No murmur heard.    No friction rub. No gallop.  Pulmonary:     Effort: Pulmonary effort is normal. No respiratory distress.     Breath sounds: Normal breath sounds. No stridor. No wheezing, rhonchi  or rales.  Abdominal:     General: Bowel sounds are normal. There is no distension.     Palpations: Abdomen is soft. There is no mass.     Tenderness: There is no abdominal tenderness. There is no guarding or rebound.     Hernia: No hernia is present.  Skin:    General: Skin is warm and dry.  Neurological:     Mental Status: He is alert and oriented to person, place, and time.   GI notes reviewed, liver enzyme test report reviewed.  MRCP report reviewed.  Assessment  Biliary colic secondary to biliary sludge, transaminitis, chronic anticoagulation with Eliquis Plan  Patient will be seeing his cardiologist next week.  Heminger be taken off of Eliquis as he has been in normal sinus rhythm.  That being said, patient needs a robotic assisted laparoscopic cholecystectomy with liver biopsy.  This will be scheduled after his cardiology visit.  Should he continue to be on Eliquis, he will need to hold that for 2 days prior to the procedure.  The risks and benefits of the procedure including bleeding, infection, hepatobiliary injury, and the possibility of an open procedure were fully explained to the patient, who gave informed consent. Addendum:  Patient has been cleared by Cardiology.

## 2023-08-30 ENCOUNTER — Other Ambulatory Visit: Payer: Self-pay | Admitting: Internal Medicine

## 2023-08-30 DIAGNOSIS — I10 Essential (primary) hypertension: Secondary | ICD-10-CM

## 2023-08-30 NOTE — Telephone Encounter (Signed)
 Copied from CRM [id number]. Topic: Clinical - Medication Refill >> Aug 30, 2023 11:02 AM Carlatta H wrote: Most Recent Primary Care Visit:  Provider: [name]  Department: [facility]  Visit Type: OFFICE VISIT  Date: 07/18/2023  Medication: ramipril (ALTACE) 5 MG capsule [[id number]] amLODipine (NORVASC) 5 MG tablet [[id number]]   Has the patient contacted their pharmacy? No (Agent: If no, request that the patient contact the pharmacy for the refill. If patient does not wish to contact the pharmacy document the reason why and proceed with request.) (Agent: If yes, when and what did the pharmacy advise?)  Is this the correct pharmacy for this prescription? Yes If no, delete pharmacy and type the correct one.  This is the patient's preferred pharmacy:    [facility] [id number] - [place], [place] - [address] [place] [address] Phone: [phone] Fax: [phone]   Has the prescription been filled recently? No  Is the patient out of the medication? Yes  Has the patient been seen for an appointment in the last year OR does the patient have an upcoming appointment? Yes  Can we respond through MyChart? No  Agent: Please be advised that Rx refills Supan take up to 3 business days. We ask that you follow-up with your pharmacy.

## 2023-08-30 NOTE — Patient Instructions (Addendum)
 Joel Cower Durand Sr.  08/30/2023       Your procedure is scheduled on 09/05/23.  Report to [facility] Main Entrance at 8:10 A.M.  Call this number if you have problems the morning of surgery:  [phone]  If you experience any cold or flu symptoms such as cough, fever, chills, shortness of breath, etc. between now and your scheduled surgery, please notify us at the above number.   Remember:   Do not eat after midnight.   You Helbling drink clear liquids until 6:10 am .  Clear liquids allowed are:  Water, Juice (No red color; non-citric and without pulp; diabetics please choose diet or no sugar options), Carbonated beverages (diabetics please choose diet or no sugar options), Clear Tea (No creamer, milk, or cream, including half & half and powdered creamer), Black Coffee Only (No creamer, milk or cream, including half & half and powdered creamer), and Clear Sports drink (No red color; diabetics please choose diet or no sugar options)    Take these medicines the morning of surgery with A SIP OF WATER Claritin, protonix   STOP TAKING ELIQUIS 2 DAYS PRIOR TO SURGERY (LAST DOSE ON 09/02/23)    Do not wear jewelry, make-up or nail polish, including gel polish,  artificial nails, or any other type of covering on natural nails (fingers and  toes).  Do not wear lotions, powders, or perfumes, or deodorant.  Do not shave 48 hours prior to surgery.  Men Rule shave face and neck.  Do not bring valuables to the hospital.  [facility] is not responsible for any belongings or valuables.  Contacts, dentures or bridgework Mcduffee not be worn into surgery.  Leave your suitcase in the car.  After surgery it Colan be brought to your room.  For patients admitted to the hospital, discharge time will be determined by your treatment team.  Patients discharged the day of surgery will not be allowed to drive home and must having someone be with them for 24 hours after surgery.   Special instructions:  DO NOT SMOKE  TOBACCO OR VAPE 24 HOURS PRIOR TO YOUR PROCEDURE.    Please read over the following fact sheets that you were given. Coughing and Deep Breathing, Anesthesia Post-op Instructions, and Care and Recovery After Surgery   Minimally Invasive Cholecystectomy, Care After What can I expect after the procedure? After the procedure, it is common to: Have pain at the areas of surgery. You will be given medicines for pain. Vomit or feel like you Milledge vomit. Feel fullness in the belly (bloating) or have pain in the shoulder. This comes from the gas that was used during the surgery. Follow these instructions at home: Medicines Take over-the-counter and prescription medicines only as told by your doctor. If you were prescribed an antibiotic medicine, take it as told by your doctor. Do not stop taking it even if you start to feel better. If told, take steps to prevent problems with pooping (constipation). You Preece need to: Drink enough fluid to keep your pee (urine) pale yellow. Take medicines. You will be told what medicines to take. Eat foods that are high in fiber. These include beans, whole grains, and fresh fruits and vegetables. Limit foods that are high in fat and sugar. These include fried or sweet foods. Ask your doctor if you should avoid driving or using machines while you are taking your medicine. Incision care  Follow instructions from your doctor about how to take care of your  cuts from surgery (incisions). Make sure you: Wash your hands with soap and water for at least 20 seconds before and after you change your bandage (dressing). If you cannot use soap and water, use hand sanitizer. Change your bandage. Leave stitches (sutures) or skin glue in place for at least 2 weeks. Leave tape strips alone unless you are told to take them off. You Ardelean trim the edges of the tape strips if they curl up. Do not take baths, swim, or use a hot tub. Ask your doctor about taking showers or sponge  baths. Check your incision area every day for signs of infection. Check for: More redness, swelling, or pain. Fluid or blood. Warmth. Pus or a bad smell. Activity Rest as told by your doctor. Do not do activities that require a lot of effort. Get up to take short walks every 1 to 2 hours. Ask for help if you feel weak or unsteady. Do not lift anything that is heavier than 10 lb (4.5 kg), or the limit that you are told. Do not play contact sports until your doctor says it is okay. Do not return to work or school until your doctor says it is okay. Return to your normal activities when your doctor says that it is safe. General instructions If you were given a sedative during your procedure, do not drive or use machines until your doctor says that it is safe. A sedative is a medicine that helps you relax. Keep all follow-up visits. Contact a doctor if: You get a rash. You have more redness, swelling, or pain around your incisions. You have fluid or blood coming from your incisions. Your incisions feel warm to the touch. You have pus or a bad smell coming from your incisions. You have a fever. One or more of your incisions breaks open. Get help right away if: You have trouble breathing. You have chest pain. You have pain that is getting worse in your shoulders. You faint or feel dizzy when you stand. You have very bad pain in your belly (abdomen). You feel like you Bowlby vomit or you vomit, and this lasts for more than one day. You have leg pain. These symptoms Casella be an emergency. Get help right away. Call 911. Do not wait to see if the symptoms will go away. Do not drive yourself to the hospital. Summary After your surgery, it is common to have pain at the areas of surgery. You Hobson also vomit or feel fullness in the belly. Follow your doctor's instructions about medicine, activity restrictions, and caring for your surgery areas. Do not do activities that require a lot of  effort. Contact a doctor if you have a fever or other signs of infection, such as more redness, swelling, or pain around your incisions. Get help right away if you have chest pain, increasing pain in the shoulders, or trouble breathing. This information is not intended to replace advice given to you by your health care provider. Make sure you discuss any questions you have with your health care provider. Document Revised: 11/01/2020 Document Reviewed: 11/02/2020 Elsevier Patient Education  2024 Elsevier Inc.  General Anesthesia, Adult, Care After The following information offers guidance on how to care for yourself after your procedure. Your health care provider Deviney also give you more specific instructions. If you have problems or questions, contact your health care provider. What can I expect after the procedure? After the procedure, it is common for people to: Have pain or discomfort at  the IV site. Have nausea or vomiting. Have a sore throat or hoarseness. Have trouble concentrating. Feel cold or chills. Feel weak, sleepy, or tired (fatigue). Have soreness and body aches. These can affect parts of the body that were not involved in surgery. Follow these instructions at home: For the time period you were told by your health care provider:  Rest. Do not participate in activities where you could fall or become injured. Do not drive or use machinery. Do not drink alcohol. Do not take sleeping pills or medicines that cause drowsiness. Do not make important decisions or sign legal documents. Do not take care of children on your own. General instructions Drink enough fluid to keep your urine pale yellow. If you have sleep apnea, surgery and certain medicines can increase your risk for breathing problems. Follow instructions from your health care provider about wearing your sleep device: Anytime you are sleeping, including during daytime naps. While taking prescription pain medicines,  sleeping medicines, or medicines that make you drowsy. Return to your normal activities as told by your health care provider. Ask your health care provider what activities are safe for you. Take over-the-counter and prescription medicines only as told by your health care provider. Do not use any products that contain nicotine or tobacco. These products include cigarettes, chewing tobacco, and vaping devices, such as e-cigarettes. These can delay incision healing after surgery. If you need help quitting, ask your health care provider. Contact a health care provider if: You have nausea or vomiting that does not get better with medicine. You vomit every time you eat or drink. You have pain that does not get better with medicine. You cannot urinate or have bloody urine. You develop a skin rash. You have a fever. Get help right away if: You have trouble breathing. You have chest pain. You vomit blood. These symptoms Koren be an emergency. Get help right away. Call 911. Do not wait to see if the symptoms will go away. Do not drive yourself to the hospital. Summary After the procedure, it is common to have a sore throat, hoarseness, nausea, vomiting, or to feel weak, sleepy, or fatigue. For the time period you were told by your health care provider, do not drive or use machinery. Get help right away if you have difficulty breathing, have chest pain, or vomit blood. These symptoms Ringwald be an emergency. This information is not intended to replace advice given to you by your health care provider. Make sure you discuss any questions you have with your health care provider. Document Revised: 07/29/2021 Document Reviewed: 07/29/2021 Elsevier Patient Education  2024 Elsevier Inc.  How to Use Chlorhexidine at Home in the Shower Chlorhexidine gluconate (CHG) is a germ-killing (antiseptic) wash that's used to clean the skin. It can get rid of the germs that normally live on the skin and can keep them away  for about 24 hours. If you're having surgery, you Radice be told to shower with CHG at home the night before surgery. This can help lower your risk for infection. To use CHG wash in the shower, follow the steps below. Supplies needed: CHG body wash. Clean washcloth. Clean towel. How to use CHG in the shower Follow these steps unless you're told to use CHG in a different way: Start the shower. Use your normal soap and shampoo to wash your face and hair. Turn off the shower or move out of the shower stream. Pour CHG onto a clean washcloth. Do not use any type  of brush or rough sponge. Start at your neck, washing your body down to your toes. Make sure you: Wash the part of your body where the surgery will be done for at least 1 minute. Do not scrub. Do not use CHG on your head or face unless your health care provider tells you to. If it gets into your ears or eyes, rinse them well with water. Do not wash your genitals with CHG. Wash your back and under your arms. Make sure to wash skin folds. Let the CHG sit on your skin for 1-2 minutes or as long as told. Rinse your entire body in the shower, including all body creases and folds. Turn off the shower. Dry off with a clean towel. Do not put anything on your skin afterward, such as powder, lotion, or perfume. Put on clean clothes or pajamas. If it's the night before surgery, sleep in clean sheets. General tips Use CHG only as told, and follow the instructions on the label. Use the full amount of CHG as told. This is often one bottle. Do not smoke and stay away from flames after using CHG. Your skin Sazama feel sticky after using CHG. This is normal. The sticky feeling will go away as the CHG dries. Do not use CHG: If you have a chlorhexidine allergy or have reacted to chlorhexidine in the past. On open wounds or areas of skin that have broken skin, cuts, or scrapes. On babies younger than 5 months of age. Contact a health care provider  if: You have questions about using CHG. Your skin gets irritated or itchy. You have a rash after using CHG. You swallow any CHG. Call your local poison control center [phone] in the [place] Your eyes itch badly, or they become very red or swollen. Your hearing changes. You have trouble seeing. If you can't reach your provider, go to an urgent care or emergency room. Do not drive yourself. Get help right away if: You have swelling or tingling in your mouth or throat. You make high-pitched whistling sounds when you breathe, most often when you breathe out (wheeze). You have trouble breathing. These symptoms Heinz be an emergency. Call 911 right away. Do not wait to see if the symptoms will go away. Do not drive yourself to the hospital. This information is not intended to replace advice given to you by your health care provider. Make sure you discuss any questions you have with your health care provider. Document Revised: 11/14/2022 Document Reviewed: 11/10/2021 Elsevier Patient Education  2024 [employer].

## 2023-08-31 ENCOUNTER — Encounter (INDEPENDENT_AMBULATORY_CARE_PROVIDER_SITE_OTHER): Payer: Self-pay

## 2023-09-03 ENCOUNTER — Encounter (HOSPITAL_COMMUNITY)
Admission: RE | Admit: 2023-09-03 | Discharge: 2023-09-03 | Disposition: A | Discharge status: Home / Self Care | Source: Ambulatory Visit | Attending: General Surgery | Admitting: General Surgery

## 2023-09-03 ENCOUNTER — Encounter: Admitting: Orthopaedic Surgery

## 2023-09-03 ENCOUNTER — Encounter (HOSPITAL_COMMUNITY): Payer: Self-pay

## 2023-09-03 ENCOUNTER — Telehealth: Payer: Self-pay | Admitting: Internal Medicine

## 2023-09-03 DIAGNOSIS — Z01818 Encounter for other preprocedural examination: Secondary | ICD-10-CM

## 2023-09-03 DIAGNOSIS — Z01812 Encounter for preprocedural laboratory examination: Secondary | ICD-10-CM | POA: Diagnosis present

## 2023-09-03 HISTORY — DX: Other complications of anesthesia, initial encounter: T88.59XA

## 2023-09-03 LAB — CBC WITH DIFFERENTIAL/PLATELET
Abs Immature Granulocytes: 0.02 10*3/uL (ref 0.00–0.07)
Basophils Absolute: 0.1 10*3/uL (ref 0.0–0.1)
Basophils Relative: 1 %
Eosinophils Absolute: 0.2 10*3/uL (ref 0.0–0.5)
Eosinophils Relative: 3 %
HCT: 38.1 % — ABNORMAL LOW (ref 39.0–52.0)
Hemoglobin: 12.7 g/dL — ABNORMAL LOW (ref 13.0–17.0)
Immature Granulocytes: 0 %
Lymphocytes Relative: 38 %
Lymphs Abs: 1.9 10*3/uL (ref 0.7–4.0)
MCH: 30.6 pg (ref 26.0–34.0)
MCHC: 33.3 g/dL (ref 30.0–36.0)
MCV: 91.8 fL (ref 80.0–100.0)
Monocytes Absolute: 0.6 10*3/uL (ref 0.1–1.0)
Monocytes Relative: 12 %
Neutro Abs: 2.3 10*3/uL (ref 1.7–7.7)
Neutrophils Relative %: 46 %
Platelets: 261 10*3/uL (ref 150–400)
RBC: 4.15 MIL/uL — ABNORMAL LOW (ref 4.22–5.81)
RDW: 12.9 % (ref 11.5–15.5)
WBC: 5.1 10*3/uL (ref 4.0–10.5)
nRBC: 0 % (ref 0.0–0.2)

## 2023-09-03 LAB — COMPREHENSIVE METABOLIC PANEL WITH GFR
ALT: 32 U/L (ref 0–44)
AST: 28 U/L (ref 15–41)
Albumin: 3.7 g/dL (ref 3.5–5.0)
Alkaline Phosphatase: 149 U/L — ABNORMAL HIGH (ref 38–126)
Anion gap: 10 (ref 5–15)
BUN: 20 mg/dL (ref 8–23)
CO2: 26 mmol/L (ref 22–32)
Calcium: 8.9 mg/dL (ref 8.9–10.3)
Chloride: 102 mmol/L (ref 98–111)
Creatinine, Ser: 0.94 mg/dL (ref 0.61–1.24)
GFR, Estimated: >60 mL/min (ref >60)
Glucose, Bld: 118 mg/dL — ABNORMAL HIGH (ref 70–99)
Potassium: 3.3 mmol/L — ABNORMAL LOW (ref 3.5–5.1)
Sodium: 138 mmol/L (ref 135–145)
Total Bilirubin: 0.9 mg/dL (ref 0.0–1.2)
Total Protein: 6.3 g/dL — ABNORMAL LOW (ref 6.5–8.1)

## 2023-09-03 NOTE — Telephone Encounter (Signed)
 Called need these medicine sent to correct pharmacy ASAP   [facility] has taken out $300.00 from their account wrong pharmacy   amLODipine  (NORVASC ) 5 MG tablet [[id number]]   amLODipine  (NORVASC ) 5 MG tablet [[id number]]    Correct [facility]: [facility] [address] [place]

## 2023-09-04 ENCOUNTER — Other Ambulatory Visit: Payer: Self-pay

## 2023-09-04 DIAGNOSIS — I10 Essential (primary) hypertension: Secondary | ICD-10-CM

## 2023-09-04 MED ORDER — AMLODIPINE BESYLATE 5 MG PO TABS: 5.0000 mg | ORAL_TABLET | Freq: Every day | ORAL | 3 refills | Status: AC

## 2023-09-04 MED ORDER — RAMIPRIL 5 MG PO CAPS: 5.0000 mg | ORAL_CAPSULE | Freq: Every day | ORAL | 3 refills | Status: AC

## 2023-09-04 NOTE — Telephone Encounter (Signed)
 Refill sent.

## 2023-09-05 ENCOUNTER — Other Ambulatory Visit: Payer: Self-pay

## 2023-09-05 ENCOUNTER — Ambulatory Visit (HOSPITAL_COMMUNITY)
Admission: RE | Admit: 2023-09-05 | Discharge: 2023-09-05 | Disposition: A | Discharge status: Home / Self Care | Attending: General Surgery | Admitting: General Surgery

## 2023-09-05 ENCOUNTER — Ambulatory Visit (HOSPITAL_COMMUNITY)

## 2023-09-05 ENCOUNTER — Ambulatory Visit (HOSPITAL_BASED_OUTPATIENT_CLINIC_OR_DEPARTMENT_OTHER)

## 2023-09-05 ENCOUNTER — Encounter (HOSPITAL_COMMUNITY)
Admission: RE | Disposition: A | Discharge status: Home / Self Care | Payer: Self-pay | Source: Home / Self Care | Attending: General Surgery

## 2023-09-05 ENCOUNTER — Encounter (HOSPITAL_COMMUNITY): Payer: Self-pay | Admitting: General Surgery

## 2023-09-05 DIAGNOSIS — K449 Diaphragmatic hernia without obstruction or gangrene: Secondary | ICD-10-CM | POA: Insufficient documentation

## 2023-09-05 DIAGNOSIS — K8020 Calculus of gallbladder without cholecystitis without obstruction: Secondary | ICD-10-CM | POA: Diagnosis not present

## 2023-09-05 DIAGNOSIS — Z87891 Personal history of nicotine dependence: Secondary | ICD-10-CM | POA: Insufficient documentation

## 2023-09-05 DIAGNOSIS — I2510 Atherosclerotic heart disease of native coronary artery without angina pectoris: Secondary | ICD-10-CM | POA: Insufficient documentation

## 2023-09-05 DIAGNOSIS — I10 Essential (primary) hypertension: Secondary | ICD-10-CM | POA: Insufficient documentation

## 2023-09-05 DIAGNOSIS — R748 Abnormal levels of other serum enzymes: Secondary | ICD-10-CM

## 2023-09-05 DIAGNOSIS — K219 Gastro-esophageal reflux disease without esophagitis: Secondary | ICD-10-CM | POA: Insufficient documentation

## 2023-09-05 DIAGNOSIS — I4891 Unspecified atrial fibrillation: Secondary | ICD-10-CM | POA: Insufficient documentation

## 2023-09-05 DIAGNOSIS — J45909 Unspecified asthma, uncomplicated: Secondary | ICD-10-CM | POA: Diagnosis not present

## 2023-09-05 DIAGNOSIS — Z01818 Encounter for other preprocedural examination: Secondary | ICD-10-CM

## 2023-09-05 DIAGNOSIS — K838 Other specified diseases of biliary tract: Secondary | ICD-10-CM

## 2023-09-05 DIAGNOSIS — K8010 Calculus of gallbladder with chronic cholecystitis without obstruction: Secondary | ICD-10-CM | POA: Diagnosis not present

## 2023-09-05 DIAGNOSIS — R7401 Elevation of levels of liver transaminase levels: Secondary | ICD-10-CM | POA: Insufficient documentation

## 2023-09-05 DIAGNOSIS — Z7901 Long term (current) use of anticoagulants: Secondary | ICD-10-CM | POA: Diagnosis not present

## 2023-09-05 HISTORY — PX: LIVER BIOPSY: SHX301

## 2023-09-05 HISTORY — PX: CHOLECYSTECTOMY: SHX55

## 2023-09-05 LAB — GLUCOSE, CAPILLARY
Glucose-Capillary: 138 mg/dL — ABNORMAL HIGH (ref 70–99)
Glucose-Capillary: 233 mg/dL — ABNORMAL HIGH (ref 70–99)

## 2023-09-05 SURGERY — CHOLECYSTECTOMY, ROBOT-ASSISTED, LAPAROSCOPIC
Anesthesia: General | Site: Abdomen

## 2023-09-05 MED ORDER — EPHEDRINE SULFATE-NACL 50-0.9 MG/10ML-% IV SOSY
PREFILLED_SYRINGE | INTRAVENOUS | Status: DC | PRN
  Administered 2023-09-05: 2.5 mg via INTRAVENOUS

## 2023-09-05 MED ORDER — ONDANSETRON HCL 4 MG/2ML IJ SOLN: 4.0000 mg | Freq: Once | INTRAMUSCULAR | Status: DC | PRN

## 2023-09-05 MED ORDER — ORAL CARE MOUTH RINSE: 15.0000 mL | Freq: Once | OROMUCOSAL | Status: DC

## 2023-09-05 MED ORDER — DEXAMETHASONE SODIUM PHOSPHATE 10 MG/ML IJ SOLN
INTRAMUSCULAR | Status: AC
  Filled 2023-09-05: qty 1

## 2023-09-05 MED ORDER — OXYCODONE HCL 5 MG/5ML PO SOLN: 5.0000 mg | Freq: Once | ORAL | Status: DC | PRN

## 2023-09-05 MED ORDER — ROCURONIUM BROMIDE 100 MG/10ML IV SOLN
INTRAVENOUS | Status: DC | PRN
  Administered 2023-09-05: 60 mg via INTRAVENOUS

## 2023-09-05 MED ORDER — BUPIVACAINE HCL (PF) 0.5 % IJ SOLN
INTRAMUSCULAR | Status: DC | PRN
  Administered 2023-09-05: 30 mL

## 2023-09-05 MED ORDER — BUPIVACAINE HCL (PF) 0.5 % IJ SOLN
INTRAMUSCULAR | Status: AC
  Filled 2023-09-05: qty 30

## 2023-09-05 MED ORDER — FENTANYL CITRATE (PF) 100 MCG/2ML IJ SOLN
INTRAMUSCULAR | Status: DC | PRN
  Administered 2023-09-05 (×3): 25 ug via INTRAVENOUS

## 2023-09-05 MED ORDER — PROPOFOL 10 MG/ML IV BOLUS
INTRAVENOUS | Status: AC
  Filled 2023-09-05: qty 20

## 2023-09-05 MED ORDER — FENTANYL CITRATE PF 50 MCG/ML IJ SOSY: 25.0000 ug | PREFILLED_SYRINGE | INTRAMUSCULAR | Status: DC | PRN

## 2023-09-05 MED ORDER — KETOROLAC TROMETHAMINE 30 MG/ML IJ SOLN
INTRAMUSCULAR | Status: DC | PRN
  Administered 2023-09-05: 30 mg via INTRAVENOUS

## 2023-09-05 MED ORDER — CHLORHEXIDINE GLUCONATE CLOTH 2 % EX PADS: 6.0000 | MEDICATED_PAD | Freq: Once | CUTANEOUS | Status: DC

## 2023-09-05 MED ORDER — SUGAMMADEX SODIUM 200 MG/2ML IV SOLN
INTRAVENOUS | Status: DC | PRN
  Administered 2023-09-05 (×4): 100 mg via INTRAVENOUS

## 2023-09-05 MED ORDER — DEXMEDETOMIDINE HCL IN NACL 80 MCG/20ML IV SOLN
INTRAVENOUS | Status: DC | PRN
  Administered 2023-09-05: 8 ug via INTRAVENOUS

## 2023-09-05 MED ORDER — ONDANSETRON HCL 4 MG/2ML IJ SOLN
INTRAMUSCULAR | Status: AC
  Filled 2023-09-05: qty 2

## 2023-09-05 MED ORDER — PHENYLEPHRINE HCL-NACL 20-0.9 MG/250ML-% IV SOLN
INTRAVENOUS | Status: DC | PRN
  Administered 2023-09-05: 40 ug/min via INTRAVENOUS

## 2023-09-05 MED ORDER — CHLORHEXIDINE GLUCONATE 0.12 % MT SOLN: 15.0000 mL | Freq: Once | OROMUCOSAL | Status: DC

## 2023-09-05 MED ORDER — OXYCODONE HCL 5 MG PO TABS: 5.0000 mg | ORAL_TABLET | Freq: Once | ORAL | Status: DC | PRN

## 2023-09-05 MED ORDER — CEFAZOLIN SODIUM-DEXTROSE 2-4 GM/100ML-% IV SOLN
2.0000 g | INTRAVENOUS | Status: AC
  Administered 2023-09-05: 2 g via INTRAVENOUS

## 2023-09-05 MED ORDER — LIDOCAINE 2% (20 MG/ML) 5 ML SYRINGE
INTRAMUSCULAR | Status: AC
  Filled 2023-09-05: qty 5

## 2023-09-05 MED ORDER — LIDOCAINE 2% (20 MG/ML) 5 ML SYRINGE
INTRAMUSCULAR | Status: DC | PRN
  Administered 2023-09-05: 100 mg via INTRAVENOUS

## 2023-09-05 MED ORDER — FENTANYL CITRATE (PF) 100 MCG/2ML IJ SOLN
INTRAMUSCULAR | Status: AC
  Filled 2023-09-05: qty 2

## 2023-09-05 MED ORDER — CEFAZOLIN SODIUM-DEXTROSE 2-4 GM/100ML-% IV SOLN
INTRAVENOUS | Status: AC
Start: 2023-09-05 — End: 2023-09-05
  Filled 2023-09-05: qty 100

## 2023-09-05 MED ORDER — TRAMADOL HCL 50 MG PO TABS: 50.0000 mg | ORAL_TABLET | Freq: Four times a day (QID) | ORAL | 0 refills | Status: DC | PRN

## 2023-09-05 MED ORDER — PROPOFOL 10 MG/ML IV BOLUS
INTRAVENOUS | Status: DC | PRN
  Administered 2023-09-05: 150 mg via INTRAVENOUS
  Administered 2023-09-05: 50 mg via INTRAVENOUS
  Administered 2023-09-05: 30 mg via INTRAVENOUS

## 2023-09-05 MED ORDER — INDOCYANINE GREEN 25 MG IV SOLR
2.5000 mg | Freq: Once | INTRAVENOUS | Status: AC
  Administered 2023-09-05: 2.5 mg via INTRAVENOUS

## 2023-09-05 MED ORDER — DEXAMETHASONE SODIUM PHOSPHATE 10 MG/ML IJ SOLN
INTRAMUSCULAR | Status: DC | PRN
  Administered 2023-09-05: 5 mg via INTRAVENOUS

## 2023-09-05 MED ORDER — ONDANSETRON HCL 4 MG/2ML IJ SOLN
INTRAMUSCULAR | Status: DC | PRN
  Administered 2023-09-05: 4 mg via INTRAVENOUS

## 2023-09-05 MED ORDER — INDOCYANINE GREEN 25 MG IV SOLR
INTRAVENOUS | Status: AC
  Filled 2023-09-05: qty 10

## 2023-09-05 MED ORDER — HEMOSTATIC AGENTS (NO CHARGE) OPTIME
TOPICAL | Status: DC | PRN
  Administered 2023-09-05: 1 via TOPICAL

## 2023-09-05 MED ORDER — LACTATED RINGERS IV SOLN: INTRAVENOUS | Status: DC

## 2023-09-05 MED ORDER — PHENYLEPHRINE HCL-NACL 20-0.9 MG/250ML-% IV SOLN
INTRAVENOUS | Status: AC
  Filled 2023-09-05: qty 250

## 2023-09-05 MED ORDER — ROCURONIUM BROMIDE 10 MG/ML (PF) SYRINGE
PREFILLED_SYRINGE | INTRAVENOUS | Status: AC
  Filled 2023-09-05: qty 20

## 2023-09-05 MED ORDER — STERILE WATER FOR IRRIGATION IR SOLN
Status: DC | PRN
Start: 2023-09-05 — End: 2023-09-05
  Administered 2023-09-05: 500 mL

## 2023-09-05 SURGICAL SUPPLY — 40 items
CAUTERY HOOK MNPLR 1.6 DVNC XI (INSTRUMENTS) ×1 IMPLANT
CHLORAPREP W/TINT 26 (MISCELLANEOUS) ×1 IMPLANT
CLIP LIGATING HEM O LOK PURPLE (MISCELLANEOUS) ×1 IMPLANT
CLOTH BEACON ORANGE TIMEOUT ST (SAFETY) ×1 IMPLANT
COVER LIGHT HANDLE STERIS (MISCELLANEOUS) ×2 IMPLANT
DERMABOND ADVANCED .7 DNX12 (GAUZE/BANDAGES/DRESSINGS) ×1 IMPLANT
DRAPE ARM DVNC X/XI (DISPOSABLE) ×4 IMPLANT
DRAPE COLUMN DVNC XI (DISPOSABLE) ×1 IMPLANT
ELECTRODE REM PT RTRN 9FT ADLT (ELECTROSURGICAL) ×1 IMPLANT
FORCEPS BPLR R/ABLATION 8 DVNC (INSTRUMENTS) ×1 IMPLANT
FORCEPS PROGRASP DVNC XI (FORCEP) ×1 IMPLANT
GLOVE BIOGEL PI IND STRL 7.0 (GLOVE) ×2 IMPLANT
GLOVE SURG SS PI 7.5 STRL IVOR (GLOVE) ×2 IMPLANT
GOWN STRL REUS W/TWL LRG LVL3 (GOWN DISPOSABLE) ×3 IMPLANT
HEMOSTAT SNOW SURGICEL 2X4 (HEMOSTASIS) IMPLANT
IRRIGATOR SUCT 8 DISP DVNC XI (IRRIGATION / IRRIGATOR) IMPLANT
KIT TURNOVER KIT A (KITS) ×1 IMPLANT
MANIFOLD NEPTUNE II (INSTRUMENTS) ×1 IMPLANT
NDL BIOPSY 14X6 SOFT TISS (NEEDLE) IMPLANT
NDL HYPO 21X1.5 SAFETY (NEEDLE) ×1 IMPLANT
NDL INSUFFLATION 14GA 120MM (NEEDLE) ×1 IMPLANT
NEEDLE BIOPSY 14X6 SOFT TISS (NEEDLE) ×1 IMPLANT
NEEDLE HYPO 21X1.5 SAFETY (NEEDLE) ×1 IMPLANT
NEEDLE INSUFFLATION 14GA 120MM (NEEDLE) ×1 IMPLANT
NS IRRIG 1000ML POUR BTL (IV SOLUTION) ×1 IMPLANT
OBTURATOR OPTICALSTD 8 DVNC (TROCAR) ×1 IMPLANT
PACK LAP CHOLE LZT030E (CUSTOM PROCEDURE TRAY) ×1 IMPLANT
PAD ARMBOARD POSITIONER FOAM (MISCELLANEOUS) ×1 IMPLANT
PAD TELFA 3X4 1S STER (GAUZE/BANDAGES/DRESSINGS) ×1 IMPLANT
PENCIL HANDSWITCHING (ELECTRODE) ×1 IMPLANT
POSITIONER HEAD 8X9X4 ADT (SOFTGOODS) ×1 IMPLANT
SEAL UNIV 5-12 XI (MISCELLANEOUS) ×4 IMPLANT
SET BASIN LINEN APH (SET/KITS/TRAYS/PACK) ×1 IMPLANT
SET TUBE SMOKE EVAC HIGH FLOW (TUBING) ×1 IMPLANT
SPIKE FLUID TRANSFER (MISCELLANEOUS) ×1 IMPLANT
SUT MNCRL AB 4-0 PS2 18 (SUTURE) ×2 IMPLANT
SUT VICRYL 0 UR6 27IN ABS (SUTURE) IMPLANT
SYR 30ML LL (SYRINGE) ×1 IMPLANT
SYSTEM RETRIEVL 5MM INZII UNIV (BASKET) ×1 IMPLANT
WATER STERILE IRR 500ML POUR (IV SOLUTION) ×1 IMPLANT

## 2023-09-05 NOTE — Discharge Instructions (Signed)
May restart Eliquis tomorrow. 

## 2023-09-05 NOTE — Transfer of Care (Signed)
 Immediate Anesthesia Transfer of Care Note  Patient: Joel Kuhnert Steptoe Sr.  Procedure(s) Performed: CHOLECYSTECTOMY, ROBOT-ASSISTED, LAPAROSCOPIC (Abdomen) BIOPSY, LIVER (Abdomen)  Patient Location: PACU  Anesthesia Type:General  Level of Consciousness: drowsy  Airway & Oxygen Therapy: Patient Spontanous Breathing and Patient connected to face mask oxygen  Post-op Assessment: Report given to RN and Post -op Vital signs reviewed and stable  Post vital signs: Reviewed and stable  Last Vitals:  Vitals Value Taken Time  BP 146/63   Temp 97.5   Pulse 53 09/05/23 1140  Resp 21 09/05/23 1140  SpO2 100 % 09/05/23 1140  Vitals shown include unfiled device data.  Last Pain:  Vitals:   09/05/23 0851  TempSrc: Oral  PainSc: 0-No pain         Complications: No notable events documented.

## 2023-09-05 NOTE — Anesthesia Procedure Notes (Cosign Needed Addendum)
 Procedure Name: Intubation Date/Time: 09/05/2023 10:40 AM  Performed by: Delphine Fiedler, RNPre-anesthesia Checklist: Patient identified, Emergency Drugs available, Suction available and Patient being monitored Patient Re-evaluated:Patient Re-evaluated prior to induction Oxygen Delivery Method: Circle system utilized Preoxygenation: Pre-oxygenation with 100% oxygen Induction Type: IV induction Ventilation: Mask ventilation without difficulty and Oral airway inserted - appropriate to patient size Laryngoscope Size: Miller and 3 Grade View: Grade I Tube type: Oral Number of attempts: 1 Airway Equipment and Method: Stylet and Oral airway Placement Confirmation: ETT inserted through vocal cords under direct vision, positive ETCO2 and breath sounds checked- equal and bilateral Secured at: 22 cm Tube secured with: Tape Dental Injury: Teeth and Oropharynx as per pre-operative assessment

## 2023-09-05 NOTE — Interval H&P Note (Signed)
 History and Physical Interval Note:  09/05/2023 10:04 AM  Joel Patches Goza Sr.  has presented today for surgery, with the diagnosis of CHOLELITHIASIS BILIARY SLUDGE ELEVATED LFT'S.  The various methods of treatment have been discussed with the patient and family. After consideration of risks, benefits and other options for treatment, the patient has consented to  Procedure(s): CHOLECYSTECTOMY, ROBOT-ASSISTED, LAPAROSCOPIC (N/A) BIOPSY, LIVER (N/A) as a surgical intervention.  The patient's history has been reviewed, patient examined, no change in status, stable for surgery.  I have reviewed the patient's chart and labs.  Questions were answered to the patient's satisfaction.     [name]

## 2023-09-05 NOTE — Op Note (Addendum)
 Patient:  Joel Scheurich Dula Sr.  DOB:  09-16-1945  MRN:  [id number]   Preop Diagnosis: Biliary sludge, transaminitis  Postop Diagnosis: Same  Procedure: Robotic assisted laparoscopic cholecystectomy, liver biopsy  Surgeon: [name], MD  Anes: General endotracheal  Indications: Patient is a 78 year old white male who presented with elevated liver enzyme tests and biliary sludge seen on ultrasound.  The patient now presents for a laparoscopic cholecystectomy with liver biopsy.  The risks and benefits of the procedure including bleeding, infection, hepatobiliary injury, and the possibility of an open procedure were fully explained to the patient, who gave informed consent.  Procedure note: The patient was placed in the supine position.  After induction of general endotracheal anesthesia, the abdomen was prepped and draped using the usual sterile technique with ChloraPrep.  Surgical site confirmation was performed.  An infraumbilical incision was made down to the fascia.  A Veress needle was introduced into the abdominal cavity and confirmation of placement was done using the saline drop test.  The abdomen was then insufflated to 15 mmHg pressure.  An 8 mm trocar was introduced into the abdominal cavity under direct visualization without difficulty.  The patient was placed in reverse Trendelenburg position and additional 8 mm trocars were placed in the left upper quadrant, right lower quadrant, and right flank regions.  A small incision was made in the right upper quadrant and a Tru-Cut needle biopsy of the liver was performed.  It was sent to pathology further examination.  The puncture site in the liver was cauterized using Bovie electrocautery.  The robot was then docked and targeted.  The liver appeared to be within normal limits.  The gallbladder was retracted in a dynamic fashion in order to provide a critical view of the triangle of Calot.  The cystic duct was first identified.  Its junction to  the infundibulum was fully identified.  This was confirmed by firefly.  Hem-o-lok clips were placed proximally distally on the cystic duct and the cystic duct was divided.  This was likewise done on the cystic artery.  The gallbladder was freed away from the gallbladder fossa using Bovie electrocautery.  The gallbladder was delivered through the left upper quadrant trocar site using an Endo Catch bag without difficulty.  No abnormal bleeding or bile leakage was noted at the end of the procedure.  Surgicel was placed in the gallbladder fossa. The robot was undocked and all air was evacuated from the abdominal cavity prior to removal of the trocars.  All wounds were irrigated with normal saline.  All wounds were injected with 0.5% Sensorcaine .  The infraumbilical fascia was reapproximated using 0 Vicryl interrupted suture.  All skin incisions were closed using a 4-0 Monocryl subcuticular suture.  Dermabond was applied.  All tape and needle counts were correct at the end of the procedure.  The patient was extubated in the operating room and transferred to PACU in stable condition.  Complications: None  EBL: Minimal  Specimen: Gallbladder, liver biopsy

## 2023-09-05 NOTE — Anesthesia Preprocedure Evaluation (Signed)
 Anesthesia Evaluation  Patient identified by MRN, date of birth, ID band Patient awake    Reviewed: Allergy & Precautions, H&P , NPO status , Patient's Chart, lab work & pertinent test results, reviewed documented beta blocker date and time   History of Anesthesia Complications (+) history of anesthetic complications  Airway Mallampati: II  TM Distance: >3 FB Neck ROM: full    Dental no notable dental hx.    Pulmonary asthma , pneumonia, former smoker   Pulmonary exam normal breath sounds clear to auscultation       Cardiovascular Exercise Tolerance: Good hypertension, + CAD   Rhythm:regular Rate:Normal     Neuro/Psych  Headaches  Neuromuscular disease  negative psych ROS   GI/Hepatic Neg liver ROS, hiatal hernia,GERD  ,,  Endo/Other  diabetes    Renal/GU Renal disease  negative genitourinary   Musculoskeletal   Abdominal   Peds  Hematology  (+) Blood dyscrasia, anemia   Anesthesia Other Findings   Reproductive/Obstetrics negative OB ROS                             Anesthesia Physical Anesthesia Plan  ASA: 3  Anesthesia Plan: General and General ETT   Post-op Pain Management:    Induction:   PONV Risk Score and Plan: Ondansetron  and Scopolamine patch - Pre-op  Airway Management Planned:   Additional Equipment:   Intra-op Plan:   Post-operative Plan:   Informed Consent: I have reviewed the patients History and Physical, chart, labs and discussed the procedure including the risks, benefits and alternatives for the proposed anesthesia with the patient or authorized representative who has indicated his/her understanding and acceptance.     Dental Advisory Given  Plan Discussed with: CRNA  Anesthesia Plan Comments:        Anesthesia Quick Evaluation

## 2023-09-06 ENCOUNTER — Encounter (HOSPITAL_COMMUNITY): Payer: Self-pay | Admitting: General Surgery

## 2023-09-06 NOTE — Anesthesia Postprocedure Evaluation (Signed)
 Anesthesia Post Note  Patient: Joel Mcgovern Titsworth Sr.  Procedure(s) Performed: CHOLECYSTECTOMY, ROBOT-ASSISTED, LAPAROSCOPIC (Abdomen) BIOPSY, LIVER (Abdomen)  Patient location during evaluation: Phase II Anesthesia Type: General Level of consciousness: awake Pain management: pain level controlled Vital Signs Assessment: post-procedure vital signs reviewed and stable Respiratory status: spontaneous breathing and respiratory function stable Cardiovascular status: blood pressure returned to baseline and stable Postop Assessment: no headache and no apparent nausea or vomiting Anesthetic complications: no Comments: Late entry   No notable events documented.   Last Vitals:  Vitals:   09/05/23 1230 09/05/23 1244  BP: (!) 142/68 (!) 140/65  Pulse: (!) 51 (!) 51  Resp: 11 16  Temp:  36.6 C  SpO2: 96% 97%    Last Pain:  Vitals:   09/05/23 1244  TempSrc: Oral  PainSc: 3                  Coretha Dew

## 2023-09-07 LAB — SURGICAL PATHOLOGY

## 2023-09-09 ENCOUNTER — Emergency Department (HOSPITAL_COMMUNITY)

## 2023-09-09 ENCOUNTER — Other Ambulatory Visit: Payer: Self-pay

## 2023-09-09 ENCOUNTER — Emergency Department (HOSPITAL_COMMUNITY)
Admission: EM | Admit: 2023-09-09 | Discharge: 2023-09-09 | Discharge status: Against Medical Advice | Attending: Emergency Medicine | Admitting: Emergency Medicine

## 2023-09-09 DIAGNOSIS — Z5329 Procedure and treatment not carried out because of patient's decision for other reasons: Secondary | ICD-10-CM | POA: Diagnosis not present

## 2023-09-09 DIAGNOSIS — Z7901 Long term (current) use of anticoagulants: Secondary | ICD-10-CM | POA: Diagnosis not present

## 2023-09-09 DIAGNOSIS — R1011 Right upper quadrant pain: Secondary | ICD-10-CM | POA: Insufficient documentation

## 2023-09-09 DIAGNOSIS — Z9049 Acquired absence of other specified parts of digestive tract: Secondary | ICD-10-CM | POA: Diagnosis not present

## 2023-09-09 DIAGNOSIS — I4891 Unspecified atrial fibrillation: Secondary | ICD-10-CM | POA: Insufficient documentation

## 2023-09-09 DIAGNOSIS — R112 Nausea with vomiting, unspecified: Secondary | ICD-10-CM | POA: Insufficient documentation

## 2023-09-09 DIAGNOSIS — Z794 Long term (current) use of insulin: Secondary | ICD-10-CM | POA: Diagnosis not present

## 2023-09-09 LAB — COMPREHENSIVE METABOLIC PANEL WITH GFR
ALT: 51 U/L — ABNORMAL HIGH (ref 0–44)
AST: 51 U/L — ABNORMAL HIGH (ref 15–41)
Albumin: 4.2 g/dL (ref 3.5–5.0)
Alkaline Phosphatase: 157 U/L — ABNORMAL HIGH (ref 38–126)
Anion gap: 11 (ref 5–15)
BUN: 23 mg/dL (ref 8–23)
CO2: 24 mmol/L (ref 22–32)
Calcium: 8.9 mg/dL (ref 8.9–10.3)
Chloride: 96 mmol/L — ABNORMAL LOW (ref 98–111)
Creatinine, Ser: 1.02 mg/dL (ref 0.61–1.24)
GFR, Estimated: >60 mL/min
Glucose, Bld: 233 mg/dL — ABNORMAL HIGH (ref 70–99)
Potassium: 3.7 mmol/L (ref 3.5–5.1)
Sodium: 131 mmol/L — ABNORMAL LOW (ref 135–145)
Total Bilirubin: 1.4 mg/dL — ABNORMAL HIGH (ref 0.0–1.2)
Total Protein: 7.3 g/dL (ref 6.5–8.1)

## 2023-09-09 LAB — CBC
HCT: 42.4 % (ref 39.0–52.0)
Hemoglobin: 14.5 g/dL (ref 13.0–17.0)
MCH: 30.9 pg (ref 26.0–34.0)
MCHC: 34.2 g/dL (ref 30.0–36.0)
MCV: 90.2 fL (ref 80.0–100.0)
Platelets: 213 10*3/uL (ref 150–400)
RBC: 4.7 MIL/uL (ref 4.22–5.81)
RDW: 12.5 % (ref 11.5–15.5)
WBC: 10 10*3/uL (ref 4.0–10.5)
nRBC: 0 % (ref 0.0–0.2)

## 2023-09-09 LAB — URINALYSIS, ROUTINE W REFLEX MICROSCOPIC
Bacteria, UA: NONE SEEN
Bilirubin Urine: NEGATIVE
Glucose, UA: >=500 mg/dL — AB
Hgb urine dipstick: NEGATIVE
Ketones, ur: 20 mg/dL — AB
Leukocytes,Ua: NEGATIVE
Nitrite: NEGATIVE
Protein, ur: NEGATIVE mg/dL
Specific Gravity, Urine: 1.045 — ABNORMAL HIGH (ref 1.005–1.030)
pH: 6 (ref 5.0–8.0)

## 2023-09-09 LAB — LIPASE, BLOOD: Lipase: 21 U/L (ref 11–51)

## 2023-09-09 MED ORDER — IOHEXOL 300 MG/ML  SOLN
100.0000 mL | Freq: Once | INTRAMUSCULAR | Status: AC | PRN
  Administered 2023-09-09: 100 mL via INTRAVENOUS

## 2023-09-09 MED ORDER — ONDANSETRON 4 MG PO TBDP: 4.0000 mg | ORAL_TABLET | Freq: Three times a day (TID) | ORAL | 0 refills | Status: DC | PRN

## 2023-09-09 MED ORDER — ACETAMINOPHEN 500 MG PO TABS
1000.0000 mg | ORAL_TABLET | Freq: Once | ORAL | Status: AC
Start: 2023-09-09 — End: 2023-09-09
  Administered 2023-09-09: 1000 mg via ORAL
  Filled 2023-09-09: qty 2

## 2023-09-09 MED ORDER — ONDANSETRON HCL 4 MG/2ML IJ SOLN
4.0000 mg | Freq: Once | INTRAMUSCULAR | Status: AC
  Administered 2023-09-09: 4 mg via INTRAVENOUS
  Filled 2023-09-09: qty 2

## 2023-09-09 MED ORDER — HYDROMORPHONE HCL 1 MG/ML IJ SOLN
0.5000 mg | Freq: Once | INTRAMUSCULAR | Status: AC
  Administered 2023-09-09: 0.5 mg via INTRAVENOUS
  Filled 2023-09-09: qty 0.5

## 2023-09-09 NOTE — ED Notes (Signed)
 Pt verbalizes understanding of risks of leaving AMA, AMA form signed by pt and witnessed by RN

## 2023-09-09 NOTE — ED Provider Notes (Signed)
  EMERGENCY DEPARTMENT AT [facility] Provider Note   CSN: [id number] Arrival date & time: 09/09/23  4098     History  Chief Complaint  Patient presents with   Abdominal Pain    RUQ abd pan, stabbing pain,began 4am 4/27    Joel Patches Piccione Sr. is a 78 y.o. male.  78 yo M with hx of cholecystectomy and liver biopsy on 09/05/2023 and atrial fibrillation on Eliquis  who presents emergency department with right-sided abdominal pain.  Patient reports that he was doing well after his operation.  Went home and yesterday started experiencing right upper quadrant abdominal pain.  No fevers.  Did have an episode of vomiting.  Last bowel movement was yesterday.  Told triage that he was short of breath but denies this to me.       Home Medications Prior to Admission medications   Medication Sig Start Date End Date Taking? Authorizing Provider  ondansetron  (ZOFRAN -ODT) 4 MG disintegrating tablet Take 1 tablet (4 mg total) by mouth every 8 (eight) hours as needed for nausea or vomiting. 09/09/23  Yes Ninetta Basket, MD  Abatacept  (ORENCIA  IV) Inject 750 mg into the vein every 28 (twenty-eight) days. Patient not taking: Reported on 08/15/2023    [provider]  amLODipine  (NORVASC ) 5 MG tablet Take 1 tablet (5 mg total) by mouth daily. 09/04/23   [name], MD  apixaban  (ELIQUIS ) 5 MG TABS tablet Take 1 tablet (5 mg total) by mouth 2 (two) times daily. 07/16/23   Lenise Quince, MD  ascorbic acid  (VITAMIN C ) 500 MG tablet Take 500 mg by mouth daily.    [provider]  calcium carbonate (OSCAL) 1500 (600 Ca) MG TABS tablet Take 600 mg of elemental calcium by mouth daily.    [provider]  cholecalciferol (VITAMIN D3) 25 MCG (1000 UNIT) tablet Take 1,000 Units by mouth daily.    [provider]  Insulin  Aspart, w/Niacinamide, (FIASP ) 100 UNIT/ML SOLN 100 Units. Insulin  pump 09/27/22   [provider]  insulin  glargine (LANTUS ) 100  UNIT/ML injection Inject 16 Units into the skin as needed (If pump goes out).    [provider]  Insulin  Human (INSULIN  PUMP) SOLN Inject into the skin continuous. Fiasp  77 units    [provider]  KRILL OIL PO Take 400 mg by mouth daily.    [provider]  loratadine  (CLARITIN ) 10 MG tablet Take 10 mg by mouth daily.    [provider]  Multiple Vitamin (MULTIVITAMIN WITH MINERALS) TABS Take 1 tablet by mouth daily. Centrum    [provider]  ONE TOUCH ULTRA TEST test strip 1 each daily. Pt says 2-3 times daily 05/26/16   [provider]  pantoprazole  (PROTONIX ) 40 MG tablet TAKE 1 TABLET BY MOUTH DAILY 06/27/23   Mealor, Augustus E, MD  Potassium 99 MG TABS Take 99 mg by mouth daily.    [provider]  pravastatin  (PRAVACHOL ) 40 MG tablet Take 40 mg by mouth daily. Patient not taking: Reported on 08/15/2023    [provider]  pyridOXINE (VITAMIN B6) 50 MG tablet Take 50 mg by mouth daily.    [provider]  ramipril  (ALTACE ) 5 MG capsule Take 1 capsule (5 mg total) by mouth daily. 09/04/23   [name], MD  traMADol  (ULTRAM ) 50 MG tablet Take 1 tablet (50 mg total) by mouth every 6 (six) hours as needed. 09/05/23   [name], MD  vitamin E  400 UNIT capsule Take 400 Units by mouth in the morning.    [provider]  zinc gluconate 50 MG tablet Take 50 mg by mouth in the morning.    [provider]      Allergies    Codeine, Lipitor [atorvastatin], Invokana [canagliflozin], Losartan, Roxicodone  [oxycodone ], and Vicodin [hydrocodone -acetaminophen ]    Review of Systems   Review of Systems  Physical Exam Updated Vital Signs BP (!) 149/75   Pulse 68   Temp 98.2 F (36.8 C) (Oral)   Resp (!) 27   Ht 5\' 7"  (1.702 m)   Wt 88.5 kg   SpO2 94%   BMI 30.54 kg/m  Physical Exam Vitals and nursing note reviewed.  Constitutional:      General: He is not in acute distress.     Appearance: He is well-developed.  HENT:     Head: Normocephalic and atraumatic.     Right Ear: External ear normal.     Left Ear: External ear normal.     Nose: Nose normal.  Eyes:     Extraocular Movements: Extraocular movements intact.     Conjunctiva/sclera: Conjunctivae normal.     Pupils: Pupils are equal, round, and reactive to light.  Cardiovascular:     Rate and Rhythm: Normal rate and regular rhythm.  Pulmonary:     Effort: Pulmonary effort is normal. No respiratory distress.  Abdominal:     General: There is no distension.     Palpations: Abdomen is soft. There is no mass.     Tenderness: There is abdominal tenderness (Diffuse worse in the right upper quadrant.). There is no guarding.     Comments: Bruising over the lower abdomen.  Surgical port sites appear clean dry and intact.  Musculoskeletal:     Cervical back: Normal range of motion and neck supple.     Right lower leg: No edema.     Left lower leg: No edema.  Skin:    General: Skin is warm and dry.  Neurological:     Mental Status: He is alert. Mental status is at baseline.  Psychiatric:        Mood and Affect: Mood normal.        Behavior: Behavior normal.     ED Results / Procedures / Treatments   Labs (all labs ordered are listed, but only abnormal results are displayed) Labs Reviewed  COMPREHENSIVE METABOLIC PANEL WITH GFR - Abnormal; Notable for the following components:      Result Value   Sodium 131 (*)    Chloride 96 (*)    Glucose, Bld 233 (*)    AST 51 (*)    ALT 51 (*)    Alkaline Phosphatase 157 (*)    Total Bilirubin 1.4 (*)    All other components within normal limits  LIPASE, BLOOD  CBC  URINALYSIS, ROUTINE W REFLEX MICROSCOPIC    EKG None  Radiology CT ABDOMEN PELVIS W CONTRAST Result Date: 09/09/2023 CLINICAL DATA:  Status post cholecystectomy on 09/05/2023. Patient now complains of right upper quadrant pain. EXAM: CT ABDOMEN AND PELVIS WITH CONTRAST TECHNIQUE: Multidetector  CT imaging of the abdomen and pelvis was performed using the standard protocol following bolus administration of intravenous contrast. RADIATION DOSE REDUCTION: This exam was performed according to the departmental dose-optimization program which includes automated exposure control, adjustment of the mA and/or kV according to patient size and/or use of iterative reconstruction technique. CONTRAST:  OMNIPAQUE  IOHEXOL  300 MG/ML  SOLN  COMPARISON:  MRI 08/20/2023. FINDINGS: Lower chest: Atelectatic changes identified within both lung bases with posterior pleural thickening. No significant pleural effusion or consolidative changes. Hepatobiliary: No suspicious liver lesion. Postop change from cholecystectomy. Fluid collection with air-fluid level is identified within the gallbladder fossa measuring 6.1 x 3.2 by 2.9 cm. There is surrounding soft tissue stranding which wraps around the proximal descending duodenum. Small volume of free fluid extends along the inferior margin and lateral margin of the right lobe and along the right pericolic gutter. The common bile duct measures 7 mm in maximum dimension. No calcified common bile duct stones. Pancreas: No main duct dilatation, inflammation or mass. Spleen: Normal in size without focal abnormality. Adrenals/Urinary Tract: Normal adrenal glands. Bilateral simple appearing cysts are again noted. The largest arises off the lateral left kidney measuring 4.6 cm, image 40/2. No hydronephrosis or nephrolithiasis. Urinary bladder is unremarkable. Stomach/Bowel: Stomach appears distended. There is mild wall thickening involving the proximal descending duodenum with surrounding soft tissue stranding. No pathologic dilatation of the large or small bowel loops. The appendix is visualized and appears normal. Mild stool burden within the colon. Severe sigmoid diverticulosis with signs of chronic diverticular disease. No signs of acute inflammatory change about the sigmoid colon.  Vascular/Lymphatic: Aortic atherosclerosis without aneurysm. The upper abdominal vascularity is patent. No enlarged upper abdominal lymph nodes. Reproductive: Prostate gland enlargement.  No mass noted. Other: Small volume of fluid is noted along the proximal right pericolic gutter extending from the inferior margin of the right lobe. Within the right posterior pelvis there is a small amount of free fluid measuring 14.6 Hounsfield units. Musculoskeletal: No acute or suspicious osseous findings. Chronic bilateral L5 pars defects. Multilevel degenerative disc disease and facet arthropathy within the lumbar spine. IMPRESSION: 1. Postop change from cholecystectomy. Fluid collection with air-fluid level is identified within the gallbladder fossa measuring 6.1 x 3.2 x 2.9 cm. There is surrounding soft tissue stranding which wraps around the proximal descending duodenum. Differential considerations include postoperative seroma, hematoma or abscess. If there is a clinical concern for bile leak consider further evaluation with nuclear medicine hepatic biliary scan. 2. Small volume of free fluid extends along the inferior margin and lateral margin of the right lobe and along the right pericolic gutter. Trace fluid noted within the right posterior pelvis. 3. Severe sigmoid diverticulosis with signs of chronic diverticular disease. No signs of acute inflammatory change about the sigmoid colon. 4. Prostate gland enlargement. 5. Chronic bilateral L5 pars defects. 6.  Aortic Atherosclerosis (ICD10-I70.0). Electronically Signed   By: Kimberley Penman M.D.   On: 09/09/2023 10:06    Procedures Procedures    Medications Ordered in ED Medications  HYDROmorphone  (DILAUDID ) injection 0.5 mg (0.5 mg Intravenous Given 09/09/23 0852)  ondansetron  (ZOFRAN ) injection 4 mg (4 mg Intravenous Given 09/09/23 0853)  iohexol  (OMNIPAQUE ) 300 MG/ML solution 100 mL (100 mLs Intravenous Contrast Given 09/09/23 0940)  acetaminophen  (TYLENOL )  tablet 1,000 mg (1,000 mg Oral Given 09/09/23 1110)    ED Course/ Medical Decision Making/ A&P Clinical Course as of 09/09/23 1127  Sun Sep 09, 2023  1102 Dr [name] updated. Did offer the patient admission.  [RP]    Clinical Course User Index [RP] Ninetta Basket, MD                                 Medical Decision Making Amount and/or Complexity of Data Reviewed Labs: ordered. Radiology: ordered.  Risk OTC drugs. Prescription drug management.   Joel Patches Nawaz Sr. is a 78 y.o. male with comorbidities that complicate the patient evaluation including cholecystectomy and liver biopsy on 09/05/2023 and atrial fibrillation on Eliquis  who presents emergency department with right-sided abdominal pain.   Initial Ddx:  Bile leak, postoperative abscess, normal postoperative pain, hematoma  MDM/Course:  Patient presents emergency department with right upper quadrant abdominal pain.  Did have a cholecystectomy several days ago.  Is on Eliquis .  On exam does have diffuse pain worse in the right upper quadrant with some bruising in the lower abdomen.  Had a CT scan which shows a possible postoperative fluid collection and some fluid in the belly.  Unclear if this is a resolving Surgicel or truly a new fluid collection.  Was discussed with general surgery who offered admission to the patient for pain control and HIDA scan.  Upon re-evaluation patient was reporting significant abdominal pain still after the Dilaudid  he was given.  Did also desaturate some after the Dilaudid .  He is requesting to go home at this time.  Daughters at the bedside with him.  Did repeatedly offer the patient admission especially because of desaturations, the abdominal pain being severe, and the nausea and vomiting.  He states that he would like to leave AGAINST MEDICAL ADVICE.  Dr. [name] updated.  Dr. [name] will follow-up with the patient as an outpatient.  This patient presents to the ED for concern of complaints  listed in HPI, this involves an extensive number of treatment options, and is a complaint that carries with it a high risk of complications and morbidity. Disposition including potential need for admission considered.   Dispo: DC Home. Return precautions discussed including, but not limited to, those listed in the AVS. Allowed pt time to ask questions which were answered fully prior to dc.  Additional history obtained from daughter Records reviewed Outpatient Clinic Notes The following labs were independently interpreted: Chemistry and show  mild elevation in LFTs likely from surgery I independently reviewed the following imaging with scope of interpretation limited to determining acute life threatening conditions related to emergency care: CT Abdomen/Pelvis and agree with the radiologist interpretation with the following exceptions: none I personally reviewed and interpreted cardiac monitoring: normal sinus rhythm  I personally reviewed and interpreted the pt's EKG: see above for interpretation  I have reviewed the patients home medications and made adjustments as needed Consults: General Surgery Social Determinants of health:  Geriatric  Portions of this note were generated with Scientist, clinical (histocompatibility and immunogenetics). Dictation errors Timmins occur despite best attempts at proofreading.      Final Clinical Impression(s) / ED Diagnoses Final diagnoses:  Right upper quadrant abdominal pain  Nausea and vomiting, unspecified vomiting type  Hx of cholecystectomy    Rx / DC Orders ED Discharge Orders          Ordered    ondansetron  (ZOFRAN -ODT) 4 MG disintegrating tablet  Every 8 hours PRN        09/09/23 1114              Ninetta Basket, MD 09/09/23 1127

## 2023-09-09 NOTE — Discharge Instructions (Signed)
 You were seen for your abdominal pain and nausea and vomiting in the emergency department.   At home, please take the pain medication that the surgeons prescribed you.  Take the Zofran  for nausea and vomiting.    Check your MyChart online for the results of any tests that had not resulted by the time you left the emergency department.   Dr. [name] will call you tomorrow.  Return immediately to the emergency department if you experience any of the following: Worsening pain, vomiting despite the medication, fevers, or any other concerning symptoms.    Thank you for visiting our Emergency Department. It was a pleasure taking care of you today.

## 2023-09-09 NOTE — Progress Notes (Signed)
Dr. Lovell Sheehan at bedside.

## 2023-09-09 NOTE — ED Notes (Signed)
 Patient given mouth swabs for dry mouth to moisten oral cavity

## 2023-09-09 NOTE — ED Triage Notes (Addendum)
 Patient arrived from home, brought in by with [facility] with complaint of right upper quadrant abdominal pain. Began 4am, stabbing pain. Recent gallbladder removal on 4/23. SOB reported.

## 2023-09-11 ENCOUNTER — Encounter: Payer: Self-pay | Admitting: General Surgery

## 2023-09-13 ENCOUNTER — Telehealth: Payer: Self-pay | Admitting: Rheumatology

## 2023-09-13 ENCOUNTER — Ambulatory Visit: Admitting: General Surgery

## 2023-09-13 ENCOUNTER — Encounter: Payer: Self-pay | Admitting: General Surgery

## 2023-09-13 DIAGNOSIS — K838 Other specified diseases of biliary tract: Secondary | ICD-10-CM

## 2023-09-13 DIAGNOSIS — Z09 Encounter for follow-up examination after completed treatment for conditions other than malignant neoplasm: Secondary | ICD-10-CM

## 2023-09-13 NOTE — Telephone Encounter (Signed)
 Pt called wanting Dr. [name] to clear him to go back on [place] . Pt stated he's feeling some pain and needs some relief. Pt would like a call back on the home phone at [phone]

## 2023-09-13 NOTE — Progress Notes (Signed)
 Subjective:     Joel [name]  Virtual telephone postoperative visit performed with patient.  I was in the office and he was at home.  He states he is doing better.  He still has some incisional pain along the right side of his abdomen.  He denies any nausea or vomiting. Objective:    There were no vitals taken for this visit.  General:  Alert and oriented  Final pathology consistent with diagnosis.  Liver biopsy was unremarkable.     Assessment:    Doing well postoperatively.    Plan:   I told him that his incisional pain should continue to resolve.  Should he have any questions, he was instructed to call the office.  Follow-up as needed.  Total telephone time was 1 minute.  As this was a part of the total global surgical fee, this was not a billable visit.

## 2023-09-13 NOTE — Telephone Encounter (Signed)
 Reach out to patient to advised since he recently had surgery, he will need to have clearance from his surgeon that everything is healing well. Patient states that he will need to wait until at least 2 weeks after his surgery. Patient expressed understanding.

## 2023-09-16 ENCOUNTER — Encounter: Payer: Self-pay | Admitting: Internal Medicine

## 2023-09-17 ENCOUNTER — Other Ambulatory Visit: Payer: Self-pay | Admitting: Internal Medicine

## 2023-09-17 DIAGNOSIS — B370 Candidal stomatitis: Secondary | ICD-10-CM

## 2023-09-17 MED ORDER — NYSTATIN 100000 UNIT/ML MT SUSP: 5.0000 mL | Freq: Four times a day (QID) | OROMUCOSAL | 1 refills | Status: DC

## 2023-09-21 ENCOUNTER — Encounter (HOSPITAL_COMMUNITY): Payer: Self-pay | Admitting: General Surgery

## 2023-09-21 ENCOUNTER — Inpatient Hospital Stay (HOSPITAL_COMMUNITY)
Admission: EM | Admit: 2023-09-21 | Discharge: 2023-09-25 | DRG: 862 | Disposition: A | Discharge status: Home / Self Care | Attending: Internal Medicine | Admitting: Internal Medicine

## 2023-09-21 ENCOUNTER — Emergency Department (HOSPITAL_COMMUNITY)

## 2023-09-21 DIAGNOSIS — M0579 Rheumatoid arthritis with rheumatoid factor of multiple sites without organ or systems involvement: Secondary | ICD-10-CM | POA: Diagnosis present

## 2023-09-21 DIAGNOSIS — Z9641 Presence of insulin pump (external) (internal): Secondary | ICD-10-CM | POA: Diagnosis present

## 2023-09-21 DIAGNOSIS — I2510 Atherosclerotic heart disease of native coronary artery without angina pectoris: Secondary | ICD-10-CM | POA: Diagnosis not present

## 2023-09-21 DIAGNOSIS — Z9889 Other specified postprocedural states: Secondary | ICD-10-CM

## 2023-09-21 DIAGNOSIS — R188 Other ascites: Secondary | ICD-10-CM | POA: Diagnosis not present

## 2023-09-21 DIAGNOSIS — K651 Peritoneal abscess: Secondary | ICD-10-CM | POA: Diagnosis present

## 2023-09-21 DIAGNOSIS — E66811 Obesity, class 1: Secondary | ICD-10-CM

## 2023-09-21 DIAGNOSIS — E785 Hyperlipidemia, unspecified: Secondary | ICD-10-CM | POA: Diagnosis present

## 2023-09-21 DIAGNOSIS — E1165 Type 2 diabetes mellitus with hyperglycemia: Secondary | ICD-10-CM | POA: Diagnosis present

## 2023-09-21 DIAGNOSIS — Z79899 Other long term (current) drug therapy: Secondary | ICD-10-CM

## 2023-09-21 DIAGNOSIS — E11649 Type 2 diabetes mellitus with hypoglycemia without coma: Secondary | ICD-10-CM | POA: Diagnosis present

## 2023-09-21 DIAGNOSIS — M25511 Pain in right shoulder: Secondary | ICD-10-CM | POA: Diagnosis not present

## 2023-09-21 DIAGNOSIS — Z888 Allergy status to other drugs, medicaments and biological substances status: Secondary | ICD-10-CM

## 2023-09-21 DIAGNOSIS — R5381 Other malaise: Secondary | ICD-10-CM | POA: Diagnosis present

## 2023-09-21 DIAGNOSIS — I10 Essential (primary) hypertension: Secondary | ICD-10-CM | POA: Diagnosis not present

## 2023-09-21 DIAGNOSIS — Z9049 Acquired absence of other specified parts of digestive tract: Secondary | ICD-10-CM

## 2023-09-21 DIAGNOSIS — Z8042 Family history of malignant neoplasm of prostate: Secondary | ICD-10-CM

## 2023-09-21 DIAGNOSIS — K311 Adult hypertrophic pyloric stenosis: Secondary | ICD-10-CM | POA: Diagnosis present

## 2023-09-21 DIAGNOSIS — E1169 Type 2 diabetes mellitus with other specified complication: Secondary | ICD-10-CM | POA: Diagnosis present

## 2023-09-21 DIAGNOSIS — Z7901 Long term (current) use of anticoagulants: Secondary | ICD-10-CM

## 2023-09-21 DIAGNOSIS — Z885 Allergy status to narcotic agent status: Secondary | ICD-10-CM

## 2023-09-21 DIAGNOSIS — M1990 Unspecified osteoarthritis, unspecified site: Secondary | ICD-10-CM | POA: Diagnosis present

## 2023-09-21 DIAGNOSIS — I4891 Unspecified atrial fibrillation: Secondary | ICD-10-CM | POA: Diagnosis not present

## 2023-09-21 DIAGNOSIS — I4819 Other persistent atrial fibrillation: Secondary | ICD-10-CM | POA: Diagnosis present

## 2023-09-21 DIAGNOSIS — Z974 Presence of external hearing-aid: Secondary | ICD-10-CM

## 2023-09-21 DIAGNOSIS — B9689 Other specified bacterial agents as the cause of diseases classified elsewhere: Secondary | ICD-10-CM | POA: Diagnosis present

## 2023-09-21 DIAGNOSIS — E669 Obesity, unspecified: Secondary | ICD-10-CM | POA: Diagnosis present

## 2023-09-21 DIAGNOSIS — M542 Cervicalgia: Secondary | ICD-10-CM | POA: Diagnosis not present

## 2023-09-21 DIAGNOSIS — R001 Bradycardia, unspecified: Secondary | ICD-10-CM | POA: Diagnosis present

## 2023-09-21 DIAGNOSIS — K3189 Other diseases of stomach and duodenum: Secondary | ICD-10-CM | POA: Diagnosis present

## 2023-09-21 DIAGNOSIS — B952 Enterococcus as the cause of diseases classified elsewhere: Secondary | ICD-10-CM | POA: Diagnosis present

## 2023-09-21 DIAGNOSIS — T8143XA Infection following a procedure, organ and space surgical site, initial encounter: Secondary | ICD-10-CM | POA: Diagnosis not present

## 2023-09-21 DIAGNOSIS — Z794 Long term (current) use of insulin: Secondary | ICD-10-CM

## 2023-09-21 DIAGNOSIS — Z87442 Personal history of urinary calculi: Secondary | ICD-10-CM

## 2023-09-21 DIAGNOSIS — Z8701 Personal history of pneumonia (recurrent): Secondary | ICD-10-CM

## 2023-09-21 DIAGNOSIS — Z87891 Personal history of nicotine dependence: Secondary | ICD-10-CM

## 2023-09-21 DIAGNOSIS — H9193 Unspecified hearing loss, bilateral: Secondary | ICD-10-CM | POA: Diagnosis present

## 2023-09-21 DIAGNOSIS — K219 Gastro-esophageal reflux disease without esophagitis: Secondary | ICD-10-CM | POA: Diagnosis present

## 2023-09-21 DIAGNOSIS — E6609 Other obesity due to excess calories: Secondary | ICD-10-CM

## 2023-09-21 DIAGNOSIS — Y836 Removal of other organ (partial) (total) as the cause of abnormal reaction of the patient, or of later complication, without mention of misadventure at the time of the procedure: Secondary | ICD-10-CM | POA: Diagnosis present

## 2023-09-21 DIAGNOSIS — Z96653 Presence of artificial knee joint, bilateral: Secondary | ICD-10-CM | POA: Diagnosis present

## 2023-09-21 DIAGNOSIS — Z6830 Body mass index (BMI) 30.0-30.9, adult: Secondary | ICD-10-CM

## 2023-09-21 LAB — CBC WITH DIFFERENTIAL/PLATELET
Abs Immature Granulocytes: 0.07 10*3/uL (ref 0.00–0.07)
Basophils Absolute: 0 10*3/uL (ref 0.0–0.1)
Basophils Relative: 0 %
Eosinophils Absolute: 0 10*3/uL (ref 0.0–0.5)
Eosinophils Relative: 0 %
HCT: 36.9 % — ABNORMAL LOW (ref 39.0–52.0)
Hemoglobin: 12.2 g/dL — ABNORMAL LOW (ref 13.0–17.0)
Immature Granulocytes: 1 %
Lymphocytes Relative: 14 %
Lymphs Abs: 1.5 10*3/uL (ref 0.7–4.0)
MCH: 30.1 pg (ref 26.0–34.0)
MCHC: 33.1 g/dL (ref 30.0–36.0)
MCV: 91.1 fL (ref 80.0–100.0)
Monocytes Absolute: 1.2 10*3/uL — ABNORMAL HIGH (ref 0.1–1.0)
Monocytes Relative: 11 %
Neutro Abs: 7.8 10*3/uL — ABNORMAL HIGH (ref 1.7–7.7)
Neutrophils Relative %: 74 %
Platelets: 626 10*3/uL — ABNORMAL HIGH (ref 150–400)
RBC: 4.05 MIL/uL — ABNORMAL LOW (ref 4.22–5.81)
RDW: 12.7 % (ref 11.5–15.5)
WBC: 10.6 10*3/uL — ABNORMAL HIGH (ref 4.0–10.5)
nRBC: 0 % (ref 0.0–0.2)

## 2023-09-21 LAB — COMPREHENSIVE METABOLIC PANEL WITH GFR
ALT: 20 U/L (ref 0–44)
AST: 34 U/L (ref 15–41)
Albumin: 2.9 g/dL — ABNORMAL LOW (ref 3.5–5.0)
Alkaline Phosphatase: 99 U/L (ref 38–126)
Anion gap: 15 (ref 5–15)
BUN: 20 mg/dL (ref 8–23)
CO2: 28 mmol/L (ref 22–32)
Calcium: 9 mg/dL (ref 8.9–10.3)
Chloride: 94 mmol/L — ABNORMAL LOW (ref 98–111)
Creatinine, Ser: 1.23 mg/dL (ref 0.61–1.24)
GFR, Estimated: >60 mL/min (ref >60)
Glucose, Bld: 102 mg/dL — ABNORMAL HIGH (ref 70–99)
Potassium: 4.3 mmol/L (ref 3.5–5.1)
Sodium: 137 mmol/L (ref 135–145)
Total Bilirubin: 1 mg/dL (ref 0.0–1.2)
Total Protein: 6.7 g/dL (ref 6.5–8.1)

## 2023-09-21 LAB — I-STAT CHEM 8, ED
BUN: 21 mg/dL (ref 8–23)
Calcium, Ion: 1.08 mmol/L — ABNORMAL LOW (ref 1.15–1.40)
Chloride: 95 mmol/L — ABNORMAL LOW (ref 98–111)
Creatinine, Ser: 1.1 mg/dL (ref 0.61–1.24)
Glucose, Bld: 131 mg/dL — ABNORMAL HIGH (ref 70–99)
HCT: 39 % (ref 39.0–52.0)
Hemoglobin: 13.3 g/dL (ref 13.0–17.0)
Potassium: 4.2 mmol/L (ref 3.5–5.1)
Sodium: 135 mmol/L (ref 135–145)
TCO2: 31 mmol/L (ref 22–32)

## 2023-09-21 LAB — URINALYSIS, ROUTINE W REFLEX MICROSCOPIC
Bilirubin Urine: NEGATIVE
Glucose, UA: NEGATIVE mg/dL
Hgb urine dipstick: NEGATIVE
Ketones, ur: 20 mg/dL — AB
Leukocytes,Ua: NEGATIVE
Nitrite: NEGATIVE
Protein, ur: NEGATIVE mg/dL
Specific Gravity, Urine: >1.046 — ABNORMAL HIGH (ref 1.005–1.030)
pH: 5 (ref 5.0–8.0)

## 2023-09-21 LAB — GLUCOSE, CAPILLARY
Glucose-Capillary: 214 mg/dL — ABNORMAL HIGH (ref 70–99)
Glucose-Capillary: 111 mg/dL — ABNORMAL HIGH (ref 70–99)

## 2023-09-21 LAB — LIPASE, BLOOD: Lipase: 27 U/L (ref 11–51)

## 2023-09-21 MED ORDER — HYDROMORPHONE HCL 1 MG/ML IJ SOLN
0.5000 mg | INTRAMUSCULAR | Status: DC | PRN
  Administered 2023-09-21 – 2023-09-24 (×13): 1 mg via INTRAVENOUS
  Filled 2023-09-21 (×13): qty 1

## 2023-09-21 MED ORDER — LACTATED RINGERS IV SOLN: INTRAVENOUS | Status: AC

## 2023-09-21 MED ORDER — IOHEXOL 350 MG/ML SOLN
75.0000 mL | Freq: Once | INTRAVENOUS | Status: AC | PRN
  Administered 2023-09-21: 75 mL via INTRAVENOUS

## 2023-09-21 MED ORDER — SODIUM CHLORIDE 0.9 % IV BOLUS
1000.0000 mL | Freq: Once | INTRAVENOUS | Status: AC
  Administered 2023-09-21: 1000 mL via INTRAVENOUS

## 2023-09-21 MED ORDER — METOCLOPRAMIDE HCL 5 MG/ML IJ SOLN
5.0000 mg | Freq: Once | INTRAMUSCULAR | Status: AC
  Administered 2023-09-21: 5 mg via INTRAVENOUS
  Filled 2023-09-21: qty 2

## 2023-09-21 MED ORDER — RAMIPRIL 5 MG PO CAPS
5.0000 mg | ORAL_CAPSULE | Freq: Every day | ORAL | Status: DC
  Administered 2023-09-22: 5 mg via ORAL
  Filled 2023-09-21: qty 1

## 2023-09-21 MED ORDER — AMLODIPINE BESYLATE 5 MG PO TABS
5.0000 mg | ORAL_TABLET | Freq: Every day | ORAL | Status: DC
  Administered 2023-09-22: 5 mg via ORAL
  Filled 2023-09-21: qty 1

## 2023-09-21 MED ORDER — MORPHINE SULFATE (PF) 4 MG/ML IV SOLN
4.0000 mg | Freq: Once | INTRAVENOUS | Status: AC
  Administered 2023-09-21: 4 mg via INTRAVENOUS
  Filled 2023-09-21: qty 1

## 2023-09-21 MED ORDER — DIPHENHYDRAMINE HCL 50 MG/ML IJ SOLN
12.5000 mg | Freq: Once | INTRAMUSCULAR | Status: AC
  Administered 2023-09-21: 12.5 mg via INTRAVENOUS
  Filled 2023-09-21: qty 1

## 2023-09-21 MED ORDER — TECHNETIUM TC 99M MEBROFENIN IV KIT
5.0000 | PACK | Freq: Once | INTRAVENOUS | Status: AC | PRN
  Administered 2023-09-21: 5 via INTRAVENOUS

## 2023-09-21 MED ORDER — ONDANSETRON HCL 4 MG/2ML IJ SOLN
4.0000 mg | Freq: Once | INTRAMUSCULAR | Status: AC
  Administered 2023-09-21: 4 mg via INTRAVENOUS
  Filled 2023-09-21: qty 2

## 2023-09-21 MED ORDER — INSULIN PUMP
SUBCUTANEOUS | Status: DC
  Filled 2023-09-21: qty 1

## 2023-09-21 MED ORDER — PANTOPRAZOLE SODIUM 40 MG PO TBEC
40.0000 mg | DELAYED_RELEASE_TABLET | Freq: Every day | ORAL | Status: DC
  Administered 2023-09-22: 40 mg via ORAL
  Filled 2023-09-21: qty 1

## 2023-09-21 MED ORDER — PIPERACILLIN-TAZOBACTAM 3.375 G IVPB
3.3750 g | Freq: Three times a day (TID) | INTRAVENOUS | Status: DC
  Administered 2023-09-21 – 2023-09-25 (×12): 3.375 g via INTRAVENOUS
  Filled 2023-09-21 (×12): qty 50

## 2023-09-21 MED ORDER — SODIUM CHLORIDE 0.9% FLUSH
3.0000 mL | Freq: Two times a day (BID) | INTRAVENOUS | Status: DC
  Administered 2023-09-21 – 2023-09-25 (×7): 3 mL via INTRAVENOUS

## 2023-09-21 NOTE — Consult Note (Signed)
 Chief Complaint: Patient was seen in consultation today for biloma  Referring Physician(s): Marlin Simmonds, [place]  Supervising Physician: Fernando Hoyer  Patient Status: [name] - In-pt  History of Present Illness: Joel [name] is a 78 y.o. male with history of A Fib on Eliquis , HLD, HTN, DM2, who presented to ED with malaise after recent cholecystectomy by Dr. [name] at [facility] 4/23.  Patient did present to [facility] ED 4/27 with abdominal pain and imaging at that that shows fluid in the gall bladder fossa, however patient left before receiving treatment.  At presentation to the ED overnight he was found to be afebrile with normal WBC. Repeat CT imaging shows: Status post cholecystectomy. 7.3 x 5.9 cm fluid collection with air-fluid levels is noted in gallbladder fossa which is enlarged compared to prior exam of September 09, 2023 and is most consistent with abscess or possibly biloma. There is also noted 6.3 x 3.9 cm fluid-filled structure medial to this; it is uncertain if this abnormality represents abscess, or severely inflamed gastric antrum and proximal duodenum. Mild to moderate gastric distention is noted suggesting gastric outlet obstruction. The second fluid collection does appear to extend to the subcapsular region inferiorly of right hepatic lobe.  Interventional Radiology was requested for abdominal fluid collection drainage. Request was reviewed and approved by Drs. Marne Sings and Reunion. Patient is tentatively scheduled for same in IR today.   Patient is currently without any significant complaints. He does endorse a mild sore throat s/p NGT placement yesterday.  Patient is alert and laying in bed, calm.  Patient denies any fevers, headache, chest pain, SOB, cough, abdominal pain, nausea, vomiting or bleeding.    Past Medical History:  Diagnosis Date   A-fib [facility])    AKI (acute kidney injury) (HCC) 05/18/2021   Anemia    Arthritis    "all over"   Bradycardia    Chronic  bronchitis (HCC)    "get it just about q yr" (03/17/2014)   Complication of anesthesia    difficulty waking up   Daily headache    "here lately" (03/17/2014); relates to sinuses   GERD (gastroesophageal reflux disease)    Hard of hearing    hearing aides bilat   Hiatal hernia    History of blood transfusion 2008   "related to OR"   History of kidney stones    Hyperlipidemia    Hypertension    Pneumonia 1972 X 1   Rheumatoid arthritis (HCC)    Type II diabetes mellitus (HCC)    Wears glasses     Past Surgical History:  Procedure Laterality Date   ATRIAL FIBRILLATION ABLATION N/A 05/23/2023   Procedure: ATRIAL FIBRILLATION ABLATION;  Surgeon: [name], MD;  Location: [facility] INVASIVE CV LAB;  Service: Cardiovascular;  Laterality: N/A;   broken finger      surgical repaired left hand 2nd finger    CARDIOVERSION N/A 11/08/2022   Procedure: CARDIOVERSION;  Surgeon: [name], MD;  Location: [facility] INVASIVE CV LAB;  Service: Cardiovascular;  Laterality: N/A;   cyst removed      per left knee/posteriorly   CYSTECTOMY Left 2009   "behind knee"   INGUINAL HERNIA REPAIR Right ?2010   JOINT REPLACEMENT     KNEE ARTHROSCOPY Bilateral 1980's   LIVER BIOPSY N/A 09/05/2023   Procedure: BIOPSY, LIVER;  Surgeon: [name], MD;  Location: [facility] ORS;  Service: General;  Laterality: N/A;   PARTIAL KNEE ARTHROPLASTY Right 08/27/2015   Procedure: RIGHT UNICOMPARTMENTAL KNEE  ARTHROPLASTY;  Surgeon: [name], MD;  Location: [facility] ORS;  Service: Orthopedics;  Laterality: Right;   REVISION TOTAL KNEE ARTHROPLASTY Left 2008   SHOULDER SURGERY Left 2019   TOTAL KNEE ARTHROPLASTY Left 2003    Allergies: Codeine, Lipitor [atorvastatin], Invokana [canagliflozin], Losartan, Roxicodone  [oxycodone ], and Vicodin [hydrocodone -acetaminophen ]  Medications: Prior to Admission medications   Medication Sig Start Date End Date Taking? Authorizing Provider  Abatacept  (ORENCIA  IV) Inject 750 mg  into the vein every 28 (twenty-eight) days. Patient not taking: Reported on 08/15/2023    [provider]  amLODipine  (NORVASC ) 5 MG tablet Take 1 tablet (5 mg total) by mouth daily. 09/04/23   [name], MD  apixaban  (ELIQUIS ) 5 MG TABS tablet Take 1 tablet (5 mg total) by mouth 2 (two) times daily. 07/16/23   Lenise Quince, MD  ascorbic acid  (VITAMIN C ) 500 MG tablet Take 500 mg by mouth daily.    [provider]  calcium carbonate (OSCAL) 1500 (600 Ca) MG TABS tablet Take 600 mg of elemental calcium by mouth daily.    [provider]  cholecalciferol (VITAMIN D3) 25 MCG (1000 UNIT) tablet Take 1,000 Units by mouth daily.    [provider]  Insulin  Aspart, w/Niacinamide, (FIASP ) 100 UNIT/ML SOLN 100 Units. Insulin  pump 09/27/22   [provider]  insulin  glargine (LANTUS ) 100 UNIT/ML injection Inject 16 Units into the skin as needed (If pump goes out).    [provider]  Insulin  Human (INSULIN  PUMP) SOLN Inject into the skin continuous. Fiasp  77 units    [provider]  KRILL OIL PO Take 400 mg by mouth daily.    [provider]  loratadine  (CLARITIN ) 10 MG tablet Take 10 mg by mouth daily.    [provider]  Multiple Vitamin (MULTIVITAMIN WITH MINERALS) TABS Take 1 tablet by mouth daily. Centrum    [provider]  nystatin  (MYCOSTATIN ) 100000 UNIT/ML suspension Take 5 mLs (500,000 Units total) by mouth 4 (four) times daily. 09/17/23   [name], MD  ondansetron  (ZOFRAN -ODT) 4 MG disintegrating tablet Take 1 tablet (4 mg total) by mouth every 8 (eight) hours as needed for nausea or vomiting. 09/09/23   Ninetta Basket, MD  ONE TOUCH ULTRA TEST test strip 1 each daily. Pt says 2-3 times daily 05/26/16   [provider]  pantoprazole  (PROTONIX ) 40 MG tablet TAKE 1 TABLET BY MOUTH DAILY 06/27/23   Mealor, Augustus E, MD  Potassium 99 MG TABS Take 99 mg by mouth daily.    [provider]  pravastatin  (PRAVACHOL ) 40 MG tablet Take 40 mg by mouth daily. Patient not taking: Reported on 08/15/2023    [provider]  pyridOXINE (VITAMIN B6) 50 MG tablet Take 50 mg by mouth daily.    [provider]  ramipril  (ALTACE ) 5 MG capsule Take 1 capsule (5 mg total) by mouth daily. 09/04/23   [name], MD  traMADol  (ULTRAM ) 50 MG tablet Take 1 tablet (50 mg total) by mouth every 6 (six) hours as needed. 09/05/23   [name], MD  vitamin E  400 UNIT capsule Take 400 Units by mouth in the morning.    [provider]  zinc gluconate 50 MG tablet Take 50 mg by mouth in the morning.    [provider]     Family History  Problem Relation Age of Onset   Cancer Father        prostate  Social History   Socioeconomic History   Marital status: Married    Spouse name: Nadean August   Number of children: 3   Years of education: College   Highest education level: Not on file  Occupational History    Employer: UNEMPLOYED  Tobacco Use   Smoking status: Former    Current packs/day: 0.00    Average packs/day: 1 pack/day for 5.0 years (5.0 ttl pk-yrs)    Types: Cigarettes, Cigars    Start date: 05/16/1963    Quit date: 05/15/1968    Years since quitting: 55.3    Passive exposure: Never   Smokeless tobacco: Never   Tobacco comments:    Former smoker 06/20/23  Vaping Use   Vaping status: Never Used  Substance and Sexual Activity   Alcohol use: No    Alcohol/week: 0.0 standard drinks of alcohol   Drug use: Never   Sexual activity: Yes  Other Topics Concern   Not on file  Social History Narrative   Patient lives at home with family.   Caffeine Use: occasionally   Social Drivers of Corporate investment banker Strain: Not on file  Food Insecurity: Not on file  Transportation Needs: Not on file  Physical Activity: Not on file  Stress: Not on file  Social Connections: Not on file     Review of Systems: A 12 point ROS discussed and  pertinent positives are indicated in the HPI above.  All other systems are negative.   Vital Signs: BP (!) 147/68   Pulse (!) 59   Temp 97.8 F (36.6 C) (Oral)   Resp 13   SpO2 96%   Physical Exam Vitals reviewed.  Constitutional:      General: He is not in acute distress.    Appearance: Normal appearance.  HENT:     Head:     Comments: NGT in place.    Mouth/Throat:     Pharynx: Oropharynx is clear.  Cardiovascular:     Rate and Rhythm: Normal rate and regular rhythm.     Pulses: Normal pulses.     Heart sounds: No murmur heard. Pulmonary:     Effort: Pulmonary effort is normal. No respiratory distress.     Breath sounds: Normal breath sounds.  Abdominal:     General: Abdomen is flat.     Tenderness: There is no abdominal tenderness. There is no guarding. Negative signs include Murphy's sign.  Musculoskeletal:        General: Normal range of motion.  Skin:    General: Skin is warm and dry.  Neurological:     Mental Status: He is alert and oriented to person, place, and time.  Psychiatric:        Mood and Affect: Mood normal.        Behavior: Behavior normal.        Thought Content: Thought content normal.        Judgment: Judgment normal.       Imaging: CT ABDOMEN PELVIS W CONTRAST Result Date: 09/21/2023 CLINICAL DATA:  Acute generalized abdominal pain. EXAM: CT ABDOMEN AND PELVIS WITH CONTRAST TECHNIQUE: Multidetector CT imaging of the abdomen and pelvis was performed using the standard protocol following bolus administration of intravenous contrast. RADIATION DOSE REDUCTION: This exam was performed according to the departmental dose-optimization program which includes automated exposure control, adjustment of the mA and/or kV according to patient size and/or use of iterative reconstruction technique. CONTRAST:  75mL OMNIPAQUE  IOHEXOL  350 MG/ML SOLN COMPARISON:  September 09, 2023. FINDINGS: Lower chest: No acute abnormality. Hepatobiliary: Status post cholecystectomy.  7.3 x 5.9 cm fluid collection is noted in the gallbladder fossa which is enlarged compared to prior exam which contains air-fluid level and is highly concerning for abscess or possibly biloma. Surrounding inflammatory changes are noted. Pancreas: Unremarkable. No pancreatic ductal dilatation or surrounding inflammatory changes. Spleen: Normal in size without focal abnormality. Adrenals/Urinary Tract: Adrenal glands appear normal. Bilateral renal cysts are noted. No hydronephrosis or renal obstruction is noted. Urinary bladder is unremarkable. Stomach/Bowel: The appendix is unremarkable. Sigmoid diverticulosis is noted without inflammation. Mild to moderate gastric distention is noted concerning for gastric outlet obstruction. This appears to be due to significant wall thickening and inflammatory changes involving the gastric antrum and proximal duodenum, most secondary to adjacent inflammation and abscess in gallbladder fossa. 6.3 x 3.9 cm fluid-filled structure is seen in this area which Strollo represent abscess as well, or potentially be inflamed proximal duodenum. Vascular/Lymphatic: Aortic atherosclerosis. No enlarged abdominal or pelvic lymph nodes. Reproductive: Prostate is unremarkable. Other: No hernia or ascites is noted. Musculoskeletal: No acute or significant osseous findings. IMPRESSION: Status post cholecystectomy. 7.3 x 5.9 cm fluid collection with air-fluid levels is noted in gallbladder fossa which is enlarged compared to prior exam of September 09, 2023 and is most consistent with abscess or possibly biloma. There is also noted 6.3 x 3.9 cm fluid-filled structure medial to this; it is uncertain if this abnormality represents abscess, or severely inflamed gastric antrum and proximal duodenum. Mild to moderate gastric distention is noted suggesting gastric outlet obstruction. The second fluid collection does appear to extend to the subcapsular region inferiorly of right hepatic lobe. Sigmoid diverticulosis  without inflammation. Aortic Atherosclerosis (ICD10-I70.0). Electronically Signed   By: Rosalene Colon M.D.   On: 09/21/2023 11:24   CT ABDOMEN PELVIS W CONTRAST Result Date: 09/09/2023 CLINICAL DATA:  Status post cholecystectomy on 09/05/2023. Patient now complains of right upper quadrant pain. EXAM: CT ABDOMEN AND PELVIS WITH CONTRAST TECHNIQUE: Multidetector CT imaging of the abdomen and pelvis was performed using the standard protocol following bolus administration of intravenous contrast. RADIATION DOSE REDUCTION: This exam was performed according to the departmental dose-optimization program which includes automated exposure control, adjustment of the mA and/or kV according to patient size and/or use of iterative reconstruction technique. CONTRAST:  OMNIPAQUE  IOHEXOL  300 MG/ML  SOLN COMPARISON:  MRI 08/20/2023. FINDINGS: Lower chest: Atelectatic changes identified within both lung bases with posterior pleural thickening. No significant pleural effusion or consolidative changes. Hepatobiliary: No suspicious liver lesion. Postop change from cholecystectomy. Fluid collection with air-fluid level is identified within the gallbladder fossa measuring 6.1 x 3.2 by 2.9 cm. There is surrounding soft tissue stranding which wraps around the proximal descending duodenum. Small volume of free fluid extends along the inferior margin and lateral margin of the right lobe and along the right pericolic gutter. The common bile duct measures 7 mm in maximum dimension. No calcified common bile duct stones. Pancreas: No main duct dilatation, inflammation or mass. Spleen: Normal in size without focal abnormality. Adrenals/Urinary Tract: Normal adrenal glands. Bilateral simple appearing cysts are again noted. The largest arises off the lateral left kidney measuring 4.6 cm, image 40/2. No hydronephrosis or nephrolithiasis. Urinary bladder is unremarkable. Stomach/Bowel: Stomach appears distended. There is mild wall  thickening involving the proximal descending duodenum with surrounding soft tissue stranding. No pathologic dilatation of the large or small bowel loops. The appendix is visualized and appears normal. Mild stool burden within the  colon. Severe sigmoid diverticulosis with signs of chronic diverticular disease. No signs of acute inflammatory change about the sigmoid colon. Vascular/Lymphatic: Aortic atherosclerosis without aneurysm. The upper abdominal vascularity is patent. No enlarged upper abdominal lymph nodes. Reproductive: Prostate gland enlargement.  No mass noted. Other: Small volume of fluid is noted along the proximal right pericolic gutter extending from the inferior margin of the right lobe. Within the right posterior pelvis there is a small amount of free fluid measuring 14.6 Hounsfield units. Musculoskeletal: No acute or suspicious osseous findings. Chronic bilateral L5 pars defects. Multilevel degenerative disc disease and facet arthropathy within the lumbar spine. IMPRESSION: 1. Postop change from cholecystectomy. Fluid collection with air-fluid level is identified within the gallbladder fossa measuring 6.1 x 3.2 x 2.9 cm. There is surrounding soft tissue stranding which wraps around the proximal descending duodenum. Differential considerations include postoperative seroma, hematoma or abscess. If there is a clinical concern for bile leak consider further evaluation with nuclear medicine hepatic biliary scan. 2. Small volume of free fluid extends along the inferior margin and lateral margin of the right lobe and along the right pericolic gutter. Trace fluid noted within the right posterior pelvis. 3. Severe sigmoid diverticulosis with signs of chronic diverticular disease. No signs of acute inflammatory change about the sigmoid colon. 4. Prostate gland enlargement. 5. Chronic bilateral L5 pars defects. 6.  Aortic Atherosclerosis (ICD10-I70.0). Electronically Signed   By: Kimberley Penman M.D.   On:  09/09/2023 10:06    Labs:  CBC: Recent Labs    08/10/23 1109 09/03/23 0807 09/09/23 0907 09/21/23 0734 09/21/23 0739  WBC 6.2 5.1 10.0 10.6*  --   HGB 13.4 12.7* 14.5 12.2* 13.3  HCT 40.1 38.1* 42.4 36.9* 39.0  PLT 250 261 213 626*  --     COAGS: Recent Labs    05/28/23 1737 06/01/23 1343 07/24/23 1219 07/30/23 0850 08/13/23 0852 08/17/23 1413  INR 2.8*   < > 2.2* 2.3 2.3 1.1  APTT 50*  --   --   --   --   --    < > = values in this interval not displayed.    BMP: Recent Labs    08/10/23 1109 08/17/23 1413 09/03/23 0807 09/09/23 0907 09/21/23 0734 09/21/23 0739  NA 136 142 138 131* 137 135  K 4.0 3.9 3.3* 3.7 4.3 4.2  CL 103 99 102 96* 94* 95*  CO2 26 22 26 24 28   --   GLUCOSE 287* 210* 118* 233* 102* 131*  BUN 14 17 20 23 20 21   CALCIUM 8.7* 9.4 8.9 8.9 9.0  --   CREATININE 1.11 1.04 0.94 1.02 1.23 1.10  GFRNONAA >60  --  >60 >60 >60  --     LIVER FUNCTION TESTS: Recent Labs    08/17/23 1413 09/03/23 0807 09/09/23 0907 09/21/23 0734  BILITOT 1.6* 0.9 1.4* 1.0  AST 453* 28 51* 34  ALT 503* 32 51* 20  ALKPHOS 468* 149* 157* 99  PROT 6.3 6.3* 7.3 6.7  ALBUMIN 4.5 3.7 4.2 2.9*    TUMOR MARKERS: No results for input(s): "AFPTM", "CEA", "CA199", "CHROMGRNA" in the last 8760 hours.  Assessment and Plan: Gall bladder fossa abscess s/p cholecystectomy 09/05/23 by Dr. [name] Patient presents with RUQ abdominal pain and concern for gall bladder fossa abscess vs. Biloma.  IR consulted for aspiration and drainage.  Patient case reviewed and approved by Dr. Marne Sings for one vs. two drain placements.   All labs and medications are within acceptable  parameters. Patient is allergic to Roxicodone  and Vicodin, resulting in itching. Patient has been NPO since at least midnight.    Patient presents for tentatively scheduled abdominal fluid collection drainage in IR today.   Risks and benefits discussed with the patient including bleeding, infection,  damage to adjacent structures, bowel perforation/fistula connection, and sepsis.   All of the patient's questions were answered, patient is agreeable to proceed.   Consent signed and in chart.       Thank you for this interesting consult.  I greatly enjoyed meeting Joel Tisby Sheard [name] and look forward to participating in their care.  A copy of this report was sent to the requesting provider on this date.  Electronically Signed: Kacie Sue-Ellen Matthews, PA 09/21/2023, 3:51 PM   I spent a total of 40 Minutes    in face to face in clinical consultation, greater than 50% of which was counseling/coordinating care for biloma, with consideration for drainage.

## 2023-09-21 NOTE — Progress Notes (Signed)
 Interventional Radiology Brief Note:  IR consulted for gall bladder fossa fluid collection aspiration and drainage in patient s/p cholecystectomy 4/23.  Patient currently in NM for assessment of potential bile leak.   Case reviewed and approved by Dr. Marne Sings for procedure in IR 5/10.   Patient currently stable, afebrile, WBC WNL.   NPO p MN.  On Zosyn.  INR ordered.   Formal consult to follow.   Alcus Bradly, MS RD PA-C 4:36 PM

## 2023-09-21 NOTE — Consult Note (Signed)
 Consult Note  Joel Breed Tesfaye Sr. 07/26/1945  [id number].    Requesting MD: Dr. Inga Manges Chief Complaint/Reason for Consult: nausea, vomiting, history of robotic chole by Dr. [name] 09/05/23  HPI:  78 y.o. male with medical history significant for a fib on eliquis  (LD 5/8 at pm), arthritis, GERD, HH, HLD, HTN, RA, T2DM (insulin  pump) who presented to [facility] ED secondary to malaise, RUQ abdominal pain, N/V.  He underwent a robotic cholecystectomy by Dr. [name] at [facility] on 4/23.  He returned to the ED on 4/27 due to persistent RUQ abdominal pain.  He underwent a CT scan that revealed a fluid collection in the gb fossa.  Per the EDP's note it appears the patient left AMA at that time, unclear reasons.  He has continued to have pain as well as malaise and just "not himself."  He initially was eating well, but over the last week he has had a decrease in appetite as well as oral liquid intake.  Last night he developed nausea and vomiting numerous times.  He has continued to feel unwell, but denies fevers.  His last BM was yesterday and is passing some flatus.    He presented to [facility] today for evaluation. He has been noted to have a WBC of 10.6, normal LFTs, and a CT scan that reveals "Status post cholecystectomy. 7.3 x 5.9 cm fluid collection with air-fluid levels is noted in gallbladder fossa which is enlarged compared to prior exam of September 09, 2023 and is most consistent with abscess or possibly biloma. There is also noted 6.3 x 3.9 cm fluid-filled structure medial to this; it is uncertain if this abnormality represents abscess, or severely inflamed gastric antrum and proximal duodenum. Mild to moderate gastric distention is noted suggesting gastric outlet obstruction. The second fluid collection does appear to extend to the subcapsular region inferiorly of right hepatic lobe."  We have been asked to see the patient for further recommendations.  Blood thinners: eliquis  last dose 5/8 pm Past  Surgeries: robotic chole as above, R inguinal hernia repair   ROS: Reviewed and as above  Family History  Problem Relation Age of Onset   Cancer Father        prostate    Past Medical History:  Diagnosis Date   A-fib (HCC)    AKI (acute kidney injury) (HCC) 05/18/2021   Anemia    Arthritis    "all over"   Bradycardia    Chronic bronchitis (HCC)    "get it just about q yr" (03/17/2014)   Complication of anesthesia    difficulty waking up   Daily headache    "here lately" (03/17/2014); relates to sinuses   GERD (gastroesophageal reflux disease)    Hard of hearing    hearing aides bilat   Hiatal hernia    History of blood transfusion 2008   "related to OR"   History of kidney stones    Hyperlipidemia    Hypertension    Pneumonia 1972 X 1   Rheumatoid arthritis (HCC)    Type II diabetes mellitus (HCC)    Wears glasses     Past Surgical History:  Procedure Laterality Date   ATRIAL FIBRILLATION ABLATION N/A 05/23/2023   Procedure: ATRIAL FIBRILLATION ABLATION;  Surgeon: [name], MD;  Location: [facility] INVASIVE CV LAB;  Service: Cardiovascular;  Laterality: N/A;   broken finger      surgical repaired left hand 2nd finger    CARDIOVERSION N/A 11/08/2022  Procedure: CARDIOVERSION;  Surgeon: [name], MD;  Location: [facility] INVASIVE CV LAB;  Service: Cardiovascular;  Laterality: N/A;   cyst removed      per left knee/posteriorly   CYSTECTOMY Left 2009   "behind knee"   INGUINAL HERNIA REPAIR Right ?2010   JOINT REPLACEMENT     KNEE ARTHROSCOPY Bilateral 1980's   LIVER BIOPSY N/A 09/05/2023   Procedure: BIOPSY, LIVER;  Surgeon: [name], MD;  Location: [facility] ORS;  Service: General;  Laterality: N/A;   PARTIAL KNEE ARTHROPLASTY Right 08/27/2015   Procedure: RIGHT UNICOMPARTMENTAL KNEE ARTHROPLASTY;  Surgeon: [name], MD;  Location: [facility] ORS;  Service: Orthopedics;  Laterality: Right;   REVISION TOTAL KNEE ARTHROPLASTY Left 2008   SHOULDER SURGERY  Left 2019   TOTAL KNEE ARTHROPLASTY Left 2003    Social History:  reports that he quit smoking about 55 years ago. His smoking use included cigarettes and cigars. He started smoking about 60 years ago. He has a 5 pack-year smoking history. He has never been exposed to tobacco smoke. He has never used smokeless tobacco. He reports that he does not drink alcohol and does not use drugs.  Allergies:  Allergies  Allergen Reactions   Codeine Swelling   Lipitor [Atorvastatin] Other (See Comments)    Muscle aches    Invokana [Canagliflozin] Other (See Comments)    Stomach upset    Losartan Nausea And Vomiting   Roxicodone  [Oxycodone ] Itching   Vicodin [Hydrocodone -Acetaminophen ] Itching    (Not in a hospital admission)   Blood pressure (!) 147/68, pulse (!) 59, temperature 97.8 F (36.6 C), temperature source Oral, resp. rate 13, SpO2 96%. Physical Exam: General: pleasant, WD, male who is laying in bed in NAD but appears frail and weak HEENT: head is normocephalic, atraumatic.  Sclera are noninjected.  Pupils equal and round. EOMs intact.  Ears and nose without any masses or lesions.  Mouth is pink and dry Heart: regular, rate, and rhythm.  Normal s1,s2. No obvious murmurs, gallops, or rubs noted.  Palpable radial and pedal pulses bilaterally Lungs: CTAB, no wheezes, rhonchi, or rales noted.  Respiratory effort nonlabored Abd: soft, mild RUQ tenderness, incisions are well healed, insuline pump noted across lower abdomen, ND, +BS, no masses, hernias, or organomegaly MSK: all 4 extremities are symmetrical with no cyanosis, clubbing, or edema. Psych: A&Ox3 with an appropriate affect.    Results for orders placed or performed during the hospital encounter of 09/21/23 (from the past 48 hours)  CBC with Differential     Status: Abnormal   Collection Time: 09/21/23  7:34 AM  Result Value Ref Range   WBC 10.6 (H) 4.0 - 10.5 K/uL   RBC 4.05 (L) 4.22 - 5.81 MIL/uL   Hemoglobin 12.2 (L) 13.0 -  17.0 g/dL   HCT 29.5 (L) 62.1 - 30.8 %   MCV 91.1 80.0 - 100.0 fL   MCH 30.1 26.0 - 34.0 pg   MCHC 33.1 30.0 - 36.0 g/dL   RDW 65.7 84.6 - 96.2 %   Platelets 626 (H) 150 - 400 K/uL   nRBC 0.0 0.0 - 0.2 %   Neutrophils Relative % 74 %   Neutro Abs 7.8 (H) 1.7 - 7.7 K/uL   Lymphocytes Relative 14 %   Lymphs Abs 1.5 0.7 - 4.0 K/uL   Monocytes Relative 11 %   Monocytes Absolute 1.2 (H) 0.1 - 1.0 K/uL   Eosinophils Relative 0 %   Eosinophils Absolute 0.0 0.0 - 0.5 K/uL  Basophils Relative 0 %   Basophils Absolute 0.0 0.0 - 0.1 K/uL   Immature Granulocytes 1 %   Abs Immature Granulocytes 0.07 0.00 - 0.07 K/uL    Comment: Performed at [facility], [address], [place], [place] [address]  Comprehensive metabolic panel     Status: Abnormal   Collection Time: 09/21/23  7:34 AM  Result Value Ref Range   Sodium 137 135 - 145 mmol/L   Potassium 4.3 3.5 - 5.1 mmol/L   Chloride 94 (L) 98 - 111 mmol/L   CO2 28 22 - 32 mmol/L   Glucose, Bld 102 (H) 70 - 99 mg/dL    Comment: Glucose reference range applies only to samples taken after fasting for at least 8 hours.   BUN 20 8 - 23 mg/dL   Creatinine, Ser 6.04 0.61 - 1.24 mg/dL   Calcium 9.0 8.9 - 54.0 mg/dL   Total Protein 6.7 6.5 - 8.1 g/dL   Albumin 2.9 (L) 3.5 - 5.0 g/dL   AST 34 15 - 41 U/L   ALT 20 0 - 44 U/L   Alkaline Phosphatase 99 38 - 126 U/L   Total Bilirubin 1.0 0.0 - 1.2 mg/dL   GFR, Estimated >98 >11 mL/min    Comment: (NOTE) Calculated using the CKD-EPI Creatinine Equation (2021)    Anion gap 15 5 - 15    Comment: Performed at [facility], [address], [place], [place] [address]  Lipase, blood     Status: None   Collection Time: 09/21/23  7:34 AM  Result Value Ref Range   Lipase 27 11 - 51 U/L    Comment: Performed at [facility], [address], [place], [place] [address]  I-stat chem 8, ED (not at [facility], [facility] or [facility])     Status: Abnormal   Collection Time: 09/21/23  7:39 AM  Result Value Ref  Range   Sodium 135 135 - 145 mmol/L   Potassium 4.2 3.5 - 5.1 mmol/L   Chloride 95 (L) 98 - 111 mmol/L   BUN 21 8 - 23 mg/dL   Creatinine, Ser 1.30 0.61 - 1.24 mg/dL   Glucose, Bld 865 (H) 70 - 99 mg/dL    Comment: Glucose reference range applies only to samples taken after fasting for at least 8 hours.   Calcium, Ion 1.08 (L) 1.15 - 1.40 mmol/L   TCO2 31 22 - 32 mmol/L   Hemoglobin 13.3 13.0 - 17.0 g/dL   HCT 78.4 69.6 - 29.5 %  Urinalysis, Routine w reflex microscopic -Urine, Clean Catch     Status: Abnormal   Collection Time: 09/21/23 12:16 PM  Result Value Ref Range   Color, Urine YELLOW YELLOW   APPearance CLEAR CLEAR   Specific Gravity, Urine >1.046 (H) 1.005 - 1.030   pH 5.0 5.0 - 8.0   Glucose, UA NEGATIVE NEGATIVE mg/dL   Hgb urine dipstick NEGATIVE NEGATIVE   Bilirubin Urine NEGATIVE NEGATIVE   Ketones, ur 20 (A) NEGATIVE mg/dL   Protein, ur NEGATIVE NEGATIVE mg/dL   Nitrite NEGATIVE NEGATIVE   Leukocytes,Ua NEGATIVE NEGATIVE    Comment: Performed at [facility], [address] [place], [place] [address]   CT ABDOMEN PELVIS W CONTRAST Result Date: 09/21/2023 CLINICAL DATA:  Acute generalized abdominal pain. EXAM: CT ABDOMEN AND PELVIS WITH CONTRAST TECHNIQUE: Multidetector CT imaging of the abdomen and pelvis was performed using the standard protocol following bolus administration of intravenous contrast. RADIATION DOSE  REDUCTION: This exam was performed according to the departmental dose-optimization program which includes automated exposure control, adjustment of the mA and/or kV according to patient size and/or use of iterative reconstruction technique. CONTRAST:  75mL OMNIPAQUE  IOHEXOL  350 MG/ML SOLN COMPARISON:  September 09, 2023. FINDINGS: Lower chest: No acute abnormality. Hepatobiliary: Status post cholecystectomy. 7.3 x 5.9 cm fluid collection is noted in the gallbladder fossa which is enlarged compared to prior exam which contains air-fluid level and is highly  concerning for abscess or possibly biloma. Surrounding inflammatory changes are noted. Pancreas: Unremarkable. No pancreatic ductal dilatation or surrounding inflammatory changes. Spleen: Normal in size without focal abnormality. Adrenals/Urinary Tract: Adrenal glands appear normal. Bilateral renal cysts are noted. No hydronephrosis or renal obstruction is noted. Urinary bladder is unremarkable. Stomach/Bowel: The appendix is unremarkable. Sigmoid diverticulosis is noted without inflammation. Mild to moderate gastric distention is noted concerning for gastric outlet obstruction. This appears to be due to significant wall thickening and inflammatory changes involving the gastric antrum and proximal duodenum, most secondary to adjacent inflammation and abscess in gallbladder fossa. 6.3 x 3.9 cm fluid-filled structure is seen in this area which Bolls represent abscess as well, or potentially be inflamed proximal duodenum. Vascular/Lymphatic: Aortic atherosclerosis. No enlarged abdominal or pelvic lymph nodes. Reproductive: Prostate is unremarkable. Other: No hernia or ascites is noted. Musculoskeletal: No acute or significant osseous findings. IMPRESSION: Status post cholecystectomy. 7.3 x 5.9 cm fluid collection with air-fluid levels is noted in gallbladder fossa which is enlarged compared to prior exam of September 09, 2023 and is most consistent with abscess or possibly biloma. There is also noted 6.3 x 3.9 cm fluid-filled structure medial to this; it is uncertain if this abnormality represents abscess, or severely inflamed gastric antrum and proximal duodenum. Mild to moderate gastric distention is noted suggesting gastric outlet obstruction. The second fluid collection does appear to extend to the subcapsular region inferiorly of right hepatic lobe. Sigmoid diverticulosis without inflammation. Aortic Atherosclerosis (ICD10-I70.0). Electronically Signed   By: Rosalene Colon M.D.   On: 09/21/2023 11:24       Assessment/Plan Intraabdominal fluid collections  7.3 x 5.9 cm fluid collection in GB fossa 6.3 x 3.9 cm fluid collection RUQ abscess causing duodenal/gastric outlet obstruction POD 16 s/p Robotic chole Dr. [name] 4/23  Patient seen and examined with relevant labs and imaging personally reviewed. He has two intraabdominal fluid collections s/p robotic chole. LFTs normal and WBC mildly elevated. He is currently undergoing a HIDA scan to rule out bile leak. However, I have personally reviewed his imaging with Dr. Marne Sings in IR.  He thinks it is more likely these two collections are abscesses.  The one compressing the duodenum doesn't have a great window due to surrounding duodenum and gastric distention.  Given his gastric distention and partial blockage, we will place an NGT for decompression.  This will also help in drain planning as this Jennette allow for a better window for drain placement of the more medial fluid collection.  He has been started on Zosyn.  His last dose of Eliquis  was last night.  Continue to hold this.  It is ok for heparin  gtt if this is warranted.  I have discussed this plan with the patient and his DIL and son at the bedside, his RN, primary service, and coordination of care with IR.    FEN: NPO, NGT LIWS, IVF per primary ID: [email or web address] VTE: hold Eliquis , ok for heparin  gtt if needed.  -per [facility]- DM - insulin  pump  A fib - hold eliquis  GERD HTN HLD RA  I reviewed nursing notes, ED provider notes, Consultant Dr. [name]' notes from [facility] notes, last 24 h vitals and pain scores, last 48 h intake and output, last 24 h labs and trends, last 24 h imaging results, and discussed case in person with IR.   Joel Jones, [facility] 09/21/2023, 1:35 PM Please see Amion for pager number during day hours 7:00am-4:30pm

## 2023-09-21 NOTE — ED Provider Notes (Signed)
 [facility] [facility] AT [facility] Provider Note   CSN: [id number] Arrival date & time: 09/21/23  6962     History  Chief Complaint  Patient presents with   Abdominal Pain   Emesis    Joel Moskos Royal Sr. is a 78 y.o. male.  78 yo M with a chief complaints of nausea and vomiting.  This started last night.  The patient thinks it is related to his gallbladder procedure that was done about 2 weeks ago.  Has been weak and fatigued since the procedure.  Has been able to eat and drink but less.  No fevers.  Still moving his bowels.   Abdominal Pain Associated symptoms: vomiting   Emesis Associated symptoms: abdominal pain        Home Medications Prior to Admission medications   Medication Sig Start Date End Date Taking? Authorizing Provider  Abatacept  (ORENCIA  IV) Inject 750 mg into the vein every 28 (twenty-eight) days. Patient not taking: Reported on 08/15/2023    [provider]  amLODipine  (NORVASC ) 5 MG tablet Take 1 tablet (5 mg total) by mouth daily. 09/04/23   [name], MD  apixaban  (ELIQUIS ) 5 MG TABS tablet Take 1 tablet (5 mg total) by mouth 2 (two) times daily. 07/16/23   Lenise Quince, MD  ascorbic acid  (VITAMIN C ) 500 MG tablet Take 500 mg by mouth daily.    [provider]  calcium carbonate (OSCAL) 1500 (600 Ca) MG TABS tablet Take 600 mg of elemental calcium by mouth daily.    [provider]  cholecalciferol (VITAMIN D3) 25 MCG (1000 UNIT) tablet Take 1,000 Units by mouth daily.    [provider]  Insulin  Aspart, w/Niacinamide, (FIASP ) 100 UNIT/ML SOLN 100 Units. Insulin  pump 09/27/22   [provider]  insulin  glargine (LANTUS ) 100 UNIT/ML injection Inject 16 Units into the skin as needed (If pump goes out).    [provider]  Insulin  Human (INSULIN  PUMP) SOLN Inject into the skin continuous. Fiasp  77 units    [provider]  KRILL OIL PO Take 400 mg by mouth daily.     [provider]  loratadine  (CLARITIN ) 10 MG tablet Take 10 mg by mouth daily.    [provider]  Multiple Vitamin (MULTIVITAMIN WITH MINERALS) TABS Take 1 tablet by mouth daily. Centrum    [provider]  nystatin  (MYCOSTATIN ) 100000 UNIT/ML suspension Take 5 mLs (500,000 Units total) by mouth 4 (four) times daily. 09/17/23   [name], MD  ondansetron  (ZOFRAN -ODT) 4 MG disintegrating tablet Take 1 tablet (4 mg total) by mouth every 8 (eight) hours as needed for nausea or vomiting. 09/09/23   Ninetta Basket, MD  ONE TOUCH ULTRA TEST test strip 1 each daily. Pt says 2-3 times daily 05/26/16   [provider]  pantoprazole  (PROTONIX ) 40 MG tablet TAKE 1 TABLET BY MOUTH DAILY 06/27/23   Mealor, Augustus E, MD  Potassium 99 MG TABS Take 99 mg by mouth daily.    [provider]  pravastatin  (PRAVACHOL ) 40 MG tablet Take 40 mg by mouth daily. Patient not taking: Reported on 08/15/2023    [provider]  pyridOXINE (VITAMIN B6) 50 MG tablet Take 50 mg by mouth daily.    [provider]  ramipril  (ALTACE ) 5 MG capsule Take 1 capsule (5 mg total) by mouth daily. 09/04/23   [name], MD  traMADol  (ULTRAM ) 50 MG tablet Take 1 tablet (50 mg  total) by mouth every 6 (six) hours as needed. 09/05/23   [name], MD  vitamin E  400 UNIT capsule Take 400 Units by mouth in the morning.    [provider]  zinc gluconate 50 MG tablet Take 50 mg by mouth in the morning.    [provider]      Allergies    Codeine, Lipitor [atorvastatin], Invokana [canagliflozin], Losartan, Roxicodone  [oxycodone ], and Vicodin [hydrocodone -acetaminophen ]    Review of Systems   Review of Systems  Gastrointestinal:  Positive for abdominal pain and vomiting.    Physical Exam Updated Vital Signs BP (!) 147/68   Pulse (!) 59   Temp 97.8 F (36.6 C) (Oral)   Resp 13   SpO2 96%  Physical Exam Vitals and nursing note reviewed.   Constitutional:      Appearance: He is well-developed.  HENT:     Head: Normocephalic and atraumatic.  Eyes:     Pupils: Pupils are equal, round, and reactive to light.  Neck:     Vascular: No JVD.  Cardiovascular:     Rate and Rhythm: Normal rate and regular rhythm.     Heart sounds: No murmur heard.    No friction rub. No gallop.  Pulmonary:     Effort: No respiratory distress.     Breath sounds: No wheezing.  Abdominal:     General: There is no distension.     Tenderness: There is no abdominal tenderness. There is no guarding or rebound.  Musculoskeletal:        General: Normal range of motion.     Cervical back: Normal range of motion and neck supple.  Skin:    Coloration: Skin is not pale.     Findings: No rash.  Neurological:     Mental Status: He is alert and oriented to person, place, and time.  Psychiatric:        Behavior: Behavior normal.     ED Results / Procedures / Treatments   Labs (all labs ordered are listed, but only abnormal results are displayed) Labs Reviewed  CBC WITH DIFFERENTIAL/PLATELET - Abnormal; Notable for the following components:      Result Value   WBC 10.6 (*)    RBC 4.05 (*)    Hemoglobin 12.2 (*)    HCT 36.9 (*)    Platelets 626 (*)    Neutro Abs 7.8 (*)    Monocytes Absolute 1.2 (*)    All other components within normal limits  COMPREHENSIVE METABOLIC PANEL WITH GFR - Abnormal; Notable for the following components:   Chloride 94 (*)    Glucose, Bld 102 (*)    Albumin 2.9 (*)    All other components within normal limits  URINALYSIS, ROUTINE W REFLEX MICROSCOPIC - Abnormal; Notable for the following components:   Specific Gravity, Urine >1.046 (*)    Ketones, ur 20 (*)    All other components within normal limits  I-STAT CHEM 8, ED - Abnormal; Notable for the following components:   Chloride 95 (*)    Glucose, Bld 131 (*)    Calcium, Ion 1.08 (*)    All other components within normal limits  LIPASE, BLOOD     EKG None  Radiology CT ABDOMEN PELVIS W CONTRAST Result Date: 09/21/2023 CLINICAL DATA:  Acute generalized abdominal pain. EXAM: CT ABDOMEN AND PELVIS WITH CONTRAST TECHNIQUE: Multidetector CT imaging of the abdomen and pelvis was performed using the standard protocol following bolus administration of intravenous contrast. RADIATION DOSE REDUCTION:  This exam was performed according to the departmental dose-optimization program which includes automated exposure control, adjustment of the mA and/or kV according to patient size and/or use of iterative reconstruction technique. CONTRAST:  75mL OMNIPAQUE  IOHEXOL  350 MG/ML SOLN COMPARISON:  September 09, 2023. FINDINGS: Lower chest: No acute abnormality. Hepatobiliary: Status post cholecystectomy. 7.3 x 5.9 cm fluid collection is noted in the gallbladder fossa which is enlarged compared to prior exam which contains air-fluid level and is highly concerning for abscess or possibly biloma. Surrounding inflammatory changes are noted. Pancreas: Unremarkable. No pancreatic ductal dilatation or surrounding inflammatory changes. Spleen: Normal in size without focal abnormality. Adrenals/Urinary Tract: Adrenal glands appear normal. Bilateral renal cysts are noted. No hydronephrosis or renal obstruction is noted. Urinary bladder is unremarkable. Stomach/Bowel: The appendix is unremarkable. Sigmoid diverticulosis is noted without inflammation. Mild to moderate gastric distention is noted concerning for gastric outlet obstruction. This appears to be due to significant wall thickening and inflammatory changes involving the gastric antrum and proximal duodenum, most secondary to adjacent inflammation and abscess in gallbladder fossa. 6.3 x 3.9 cm fluid-filled structure is seen in this area which Motsinger represent abscess as well, or potentially be inflamed proximal duodenum. Vascular/Lymphatic: Aortic atherosclerosis. No enlarged abdominal or pelvic lymph nodes. Reproductive:  Prostate is unremarkable. Other: No hernia or ascites is noted. Musculoskeletal: No acute or significant osseous findings. IMPRESSION: Status post cholecystectomy. 7.3 x 5.9 cm fluid collection with air-fluid levels is noted in gallbladder fossa which is enlarged compared to prior exam of September 09, 2023 and is most consistent with abscess or possibly biloma. There is also noted 6.3 x 3.9 cm fluid-filled structure medial to this; it is uncertain if this abnormality represents abscess, or severely inflamed gastric antrum and proximal duodenum. Mild to moderate gastric distention is noted suggesting gastric outlet obstruction. The second fluid collection does appear to extend to the subcapsular region inferiorly of right hepatic lobe. Sigmoid diverticulosis without inflammation. Aortic Atherosclerosis (ICD10-I70.0). Electronically Signed   By: Rosalene Colon M.D.   On: 09/21/2023 11:24    Procedures Procedures    Medications Ordered in ED Medications  lactated ringers  infusion ( Intravenous New Bag/Given 09/21/23 1304)  sodium chloride  0.9 % bolus 1,000 mL (0 mLs Intravenous Stopped 09/21/23 0833)  metoCLOPramide  (REGLAN ) injection 5 mg (5 mg Intravenous Given 09/21/23 0728)  diphenhydrAMINE  (BENADRYL ) injection 12.5 mg (12.5 mg Intravenous Given 09/21/23 0729)  morphine  (PF) 4 MG/ML injection 4 mg (4 mg Intravenous Given 09/21/23 0957)  iohexol  (OMNIPAQUE ) 350 MG/ML injection 75 mL (75 mLs Intravenous Contrast Given 09/21/23 1012)    ED Course/ Medical Decision Making/ A&P                                 Medical Decision Making Amount and/or Complexity of Data Reviewed Labs: ordered. Radiology: ordered.  Risk Prescription drug management.   78 yo M with a chief complaints of nausea and vomiting.  Going on for about 12 hours now.  He is concerned that this is related to his recent gallbladder procedure.  That was couple weeks ago.  He has a benign abdominal exam.  Will treat symptoms here.  CT  imaging.  Reassess.  I-STAT Chem-8 without obvious change in renal function.  CT abdomen pelvis with concern for possible increasing fluid collection in the gallbladder fossa.  Unfortunately there was some mishap with the lab work and so I received no other labs as  of yet.  Will discuss with general surgery.  Mild leukocytosis, no LFT elevation.  Lipase normal.  I discussed case with Marlin Simmonds, PA-C on-call with general surgery.  Recommended HIDA scan medical admission.  The patients results and plan were reviewed and discussed.   Any x-rays performed were independently reviewed by myself.   Differential diagnosis were considered with the presenting HPI.  Medications  lactated ringers  infusion ( Intravenous New Bag/Given 09/21/23 1304)  sodium chloride  0.9 % bolus 1,000 mL (0 mLs Intravenous Stopped 09/21/23 0833)  metoCLOPramide  (REGLAN ) injection 5 mg (5 mg Intravenous Given 09/21/23 0728)  diphenhydrAMINE  (BENADRYL ) injection 12.5 mg (12.5 mg Intravenous Given 09/21/23 0729)  morphine  (PF) 4 MG/ML injection 4 mg (4 mg Intravenous Given 09/21/23 0957)  iohexol  (OMNIPAQUE ) 350 MG/ML injection 75 mL (75 mLs Intravenous Contrast Given 09/21/23 1012)    Vitals:   09/21/23 1200 09/21/23 1230 09/21/23 1315 09/21/23 1330  BP:      Pulse: (!) 57 (!) 57 (!) 57 (!) 59  Resp: 12 (!) 9 15 13   Temp:      TempSrc:      SpO2: 92% 95% 95% 96%    Final diagnoses:  Abdominal fluid collection    Admission/ observation were discussed with the admitting physician, patient and/or family and they are comfortable with the plan.          Final Clinical Impression(s) / ED Diagnoses Final diagnoses:  Abdominal fluid collection    Rx / DC Orders ED Discharge Orders     None         Albertus Hughs, DO 09/21/23 1345

## 2023-09-21 NOTE — Progress Notes (Signed)
 Patient's CBG was 214, patient's insulin  would not work so Charity fundraiser administered 4 units of novolog  via insulin  needle.

## 2023-09-21 NOTE — H&P (Signed)
 History and Physical   Joel Murtagh Gibbon Sr. QMV:[id number] DOB: 1945-09-28 DOA: 09/21/2023  PCP: [name], MD   Patient coming from: Home  Chief Complaint: Abdominal pain, nausea, vomiting  HPI: Joel Vitucci Revelo Sr. is a 78 y.o. male with medical history significant of hypertension, hyperlipidemia, diabetes, GERD, A-fib, bradycardia, CAD, RA, obesity presenting with abdominal pain and nausea and vomiting.  Patient experiencing worsening nausea and vomiting for the past day.  Reports has had some weakness and fatigue since his gallbladder procedure 2 weeks ago.  Also was seen in the ED after the procedure.  Initially was seen at [facility] where he underwent cholecystectomy on 4/16 for abdominal pain/biliary sludge/LFT elevation.  Followed up with the ED on 4/27 due to abdominal pain with CT showing fluid collection.  General surgery at that time and offered admission and HIDA scan but patient declined and left AMA.  Patient Sherrlyn Dolores presenting with continued abdominal pain and worsening nausea vomiting the last day as above.  Denies fevers, chills, chest pain, shortness of breath.   ED Course: Vital signs in the ED notable for blood pressure in the 130s-140 systolic, heart rate in the 40s-60s.  Lab workup included CMP with chloride 94, glucose 102.  CBC with leukocytosis of 10.6, hemoglobin stable at 12.2, platelets 626.  Lipase normal.  Urinalysis with ketones only.  CT of the abdomen pelvis showed patient was status post cholecystectomy and showed a 7.3 x 5.9 fluid collection at the gallbladder fossa which had increased since 4/27 which is consistent with abscess versus biloma.  Also there was a second smaller fluid collection medial to this and evidence of gastric outlet obstruction.  General surgery consulted and are seeing the patient with recommendation for HIDA scan to evaluate for possible abscess.  Patient received morphine , Reglan , Zofran , 1 L IV fluids, Benadryl  in the ED.  Review of  Systems: As per HPI otherwise all other systems reviewed and are negative.  Past Medical History:  Diagnosis Date   A-fib [facility])    AKI (acute kidney injury) (HCC) 05/18/2021   Anemia    Arthritis    "all over"   Bradycardia    Chronic bronchitis (HCC)    "get it just about q yr" (03/17/2014)   Complication of anesthesia    difficulty waking up   Daily headache    "here lately" (03/17/2014); relates to sinuses   GERD (gastroesophageal reflux disease)    Hard of hearing    hearing aides bilat   Hiatal hernia    History of blood transfusion 2008   "related to OR"   History of kidney stones    Hyperlipidemia    Hypertension    Pneumonia 1972 X 1   Rheumatoid arthritis (HCC)    Type II diabetes mellitus (HCC)    Wears glasses     Past Surgical History:  Procedure Laterality Date   ATRIAL FIBRILLATION ABLATION N/A 05/23/2023   Procedure: ATRIAL FIBRILLATION ABLATION;  Surgeon: [name], MD;  Location: [facility] INVASIVE CV LAB;  Service: Cardiovascular;  Laterality: N/A;   broken finger      surgical repaired left hand 2nd finger    CARDIOVERSION N/A 11/08/2022   Procedure: CARDIOVERSION;  Surgeon: [name], MD;  Location: [facility] INVASIVE CV LAB;  Service: Cardiovascular;  Laterality: N/A;   cyst removed      per left knee/posteriorly   CYSTECTOMY Left 2009   "behind knee"   INGUINAL HERNIA REPAIR Right ?2010  JOINT REPLACEMENT     KNEE ARTHROSCOPY Bilateral 1980's   LIVER BIOPSY N/A 09/05/2023   Procedure: BIOPSY, LIVER;  Surgeon: [name], MD;  Location: [facility] ORS;  Service: General;  Laterality: N/A;   PARTIAL KNEE ARTHROPLASTY Right 08/27/2015   Procedure: RIGHT UNICOMPARTMENTAL KNEE ARTHROPLASTY;  Surgeon: [name], MD;  Location: [facility] ORS;  Service: Orthopedics;  Laterality: Right;   REVISION TOTAL KNEE ARTHROPLASTY Left 2008   SHOULDER SURGERY Left 2019   TOTAL KNEE ARTHROPLASTY Left 2003    Social History  reports that he quit smoking about  55 years ago. His smoking use included cigarettes and cigars. He started smoking about 60 years ago. He has a 5 pack-year smoking history. He has never been exposed to tobacco smoke. He has never used smokeless tobacco. He reports that he does not drink alcohol and does not use drugs.  Allergies  Allergen Reactions   Codeine Swelling   Lipitor [Atorvastatin] Other (See Comments)    Muscle aches    Invokana [Canagliflozin] Other (See Comments)    Stomach upset    Losartan Nausea And Vomiting   Roxicodone  [Oxycodone ] Itching   Vicodin [Hydrocodone -Acetaminophen ] Itching    Family History  Problem Relation Age of Onset   Cancer Father        prostate  Reviewed on admission  Prior to Admission medications   Medication Sig Start Date End Date Taking? Authorizing Provider  Abatacept  (ORENCIA  IV) Inject 750 mg into the vein every 28 (twenty-eight) days. Patient not taking: Reported on 08/15/2023    [provider]  amLODipine  (NORVASC ) 5 MG tablet Take 1 tablet (5 mg total) by mouth daily. 09/04/23   [name], MD  apixaban  (ELIQUIS ) 5 MG TABS tablet Take 1 tablet (5 mg total) by mouth 2 (two) times daily. 07/16/23   Lenise Quince, MD  ascorbic acid  (VITAMIN C ) 500 MG tablet Take 500 mg by mouth daily.    [provider]  calcium carbonate (OSCAL) 1500 (600 Ca) MG TABS tablet Take 600 mg of elemental calcium by mouth daily.    [provider]  cholecalciferol (VITAMIN D3) 25 MCG (1000 UNIT) tablet Take 1,000 Units by mouth daily.    [provider]  Insulin  Aspart, w/Niacinamide, (FIASP ) 100 UNIT/ML SOLN 100 Units. Insulin  pump 09/27/22   [provider]  insulin  glargine (LANTUS ) 100 UNIT/ML injection Inject 16 Units into the skin as needed (If pump goes out).    [provider]  Insulin  Human (INSULIN  PUMP) SOLN Inject into the skin continuous. Fiasp  77 units    [provider]  KRILL OIL PO Take 400 mg by mouth daily.     [provider]  loratadine  (CLARITIN ) 10 MG tablet Take 10 mg by mouth daily.    [provider]  Multiple Vitamin (MULTIVITAMIN WITH MINERALS) TABS Take 1 tablet by mouth daily. Centrum    [provider]  nystatin  (MYCOSTATIN ) 100000 UNIT/ML suspension Take 5 mLs (500,000 Units total) by mouth 4 (four) times daily. 09/17/23   [name], MD  ondansetron  (ZOFRAN -ODT) 4 MG disintegrating tablet Take 1 tablet (4 mg total) by mouth every 8 (eight) hours as needed for nausea or vomiting. 09/09/23   Ninetta Basket, MD  ONE TOUCH ULTRA TEST test strip 1 each daily. Pt says 2-3 times daily 05/26/16   [provider]  pantoprazole  (PROTONIX ) 40 MG tablet TAKE 1 TABLET BY MOUTH DAILY 06/27/23   Mealor, Augustus E, MD  Potassium 99 MG TABS Take 99 mg by mouth daily.    [provider]  pravastatin  (PRAVACHOL ) 40 MG tablet Take 40 mg by mouth daily. Patient not taking: Reported on 08/15/2023    [provider]  pyridOXINE (VITAMIN B6) 50 MG tablet Take 50 mg by mouth daily.    [provider]  ramipril  (ALTACE ) 5 MG capsule Take 1 capsule (5 mg total) by mouth daily. 09/04/23   [name], MD  traMADol  (ULTRAM ) 50 MG tablet Take 1 tablet (50 mg total) by mouth every 6 (six) hours as needed. 09/05/23   [name], MD  vitamin E  400 UNIT capsule Take 400 Units by mouth in the morning.    [provider]  zinc gluconate 50 MG tablet Take 50 mg by mouth in the morning.    [provider]    Physical Exam: Vitals:   09/21/23 1200 09/21/23 1230 09/21/23 1315 09/21/23 1330  BP:      Pulse: (!) 57 (!) 57 (!) 57 (!) 59  Resp: 12 (!) 9 15 13   Temp:      TempSrc:      SpO2: 92% 95% 95% 96%    Physical Exam Constitutional:      General: He is not in acute distress.    Appearance: Normal appearance.  HENT:     Head: Normocephalic and atraumatic.     Mouth/Throat:     Mouth: Mucous membranes are moist.     Pharynx:  Oropharynx is clear.  Eyes:     Extraocular Movements: Extraocular movements intact.     Pupils: Pupils are equal, round, and reactive to light.  Cardiovascular:     Rate and Rhythm: Normal rate and regular rhythm.     Pulses: Normal pulses.     Heart sounds: Normal heart sounds.  Pulmonary:     Effort: Pulmonary effort is normal. No respiratory distress.     Breath sounds: Normal breath sounds.  Abdominal:     General: Bowel sounds are normal. There is no distension.     Palpations: Abdomen is soft.     Tenderness: There is abdominal tenderness.  Musculoskeletal:        General: No swelling or deformity.  Skin:    General: Skin is warm and dry.  Neurological:     General: No focal deficit present.     Mental Status: Mental status is at baseline.   Labs on Admission: I have personally reviewed following labs and imaging studies  CBC: Recent Labs  Lab 09/21/23 0734 09/21/23 0739  WBC 10.6*  --   NEUTROABS 7.8*  --   HGB 12.2* 13.3  HCT 36.9* 39.0  MCV 91.1  --   PLT 626*  --     Basic Metabolic Panel: Recent Labs  Lab 09/21/23 0734 09/21/23 0739  NA 137 135  K 4.3 4.2  CL 94* 95*  CO2 28  --   GLUCOSE 102* 131*  BUN 20 21  CREATININE 1.23 1.10  CALCIUM 9.0  --     GFR: Estimated Creatinine Clearance: 60.7 mL/min (by C-G formula based on SCr of 1.1 mg/dL).  Liver Function Tests: Recent Labs  Lab 09/21/23 0734  AST 34  ALT 20  ALKPHOS 99  BILITOT 1.0  PROT 6.7  ALBUMIN 2.9*    Urine analysis:    Component Value Date/Time   COLORURINE YELLOW 09/21/2023 1216   APPEARANCEUR CLEAR 09/21/2023 1216   LABSPEC >1.046 (H) 09/21/2023 1216  PHURINE 5.0 09/21/2023 1216   GLUCOSEU NEGATIVE 09/21/2023 1216   HGBUR NEGATIVE 09/21/2023 1216   BILIRUBINUR NEGATIVE 09/21/2023 1216   KETONESUR 20 (A) 09/21/2023 1216   PROTEINUR NEGATIVE 09/21/2023 1216   UROBILINOGEN 0.2 01/13/2010 0951   NITRITE NEGATIVE 09/21/2023 1216   LEUKOCYTESUR NEGATIVE 09/21/2023  1216    Radiological Exams on Admission: CT ABDOMEN PELVIS W CONTRAST Result Date: 09/21/2023 CLINICAL DATA:  Acute generalized abdominal pain. EXAM: CT ABDOMEN AND PELVIS WITH CONTRAST TECHNIQUE: Multidetector CT imaging of the abdomen and pelvis was performed using the standard protocol following bolus administration of intravenous contrast. RADIATION DOSE REDUCTION: This exam was performed according to the departmental dose-optimization program which includes automated exposure control, adjustment of the mA and/or kV according to patient size and/or use of iterative reconstruction technique. CONTRAST:  75mL OMNIPAQUE  IOHEXOL  350 MG/ML SOLN COMPARISON:  September 09, 2023. FINDINGS: Lower chest: No acute abnormality. Hepatobiliary: Status post cholecystectomy. 7.3 x 5.9 cm fluid collection is noted in the gallbladder fossa which is enlarged compared to prior exam which contains air-fluid level and is highly concerning for abscess or possibly biloma. Surrounding inflammatory changes are noted. Pancreas: Unremarkable. No pancreatic ductal dilatation or surrounding inflammatory changes. Spleen: Normal in size without focal abnormality. Adrenals/Urinary Tract: Adrenal glands appear normal. Bilateral renal cysts are noted. No hydronephrosis or renal obstruction is noted. Urinary bladder is unremarkable. Stomach/Bowel: The appendix is unremarkable. Sigmoid diverticulosis is noted without inflammation. Mild to moderate gastric distention is noted concerning for gastric outlet obstruction. This appears to be due to significant wall thickening and inflammatory changes involving the gastric antrum and proximal duodenum, most secondary to adjacent inflammation and abscess in gallbladder fossa. 6.3 x 3.9 cm fluid-filled structure is seen in this area which Phifer represent abscess as well, or potentially be inflamed proximal duodenum. Vascular/Lymphatic: Aortic atherosclerosis. No enlarged abdominal or pelvic lymph nodes.  Reproductive: Prostate is unremarkable. Other: No hernia or ascites is noted. Musculoskeletal: No acute or significant osseous findings. IMPRESSION: Status post cholecystectomy. 7.3 x 5.9 cm fluid collection with air-fluid levels is noted in gallbladder fossa which is enlarged compared to prior exam of September 09, 2023 and is most consistent with abscess or possibly biloma. There is also noted 6.3 x 3.9 cm fluid-filled structure medial to this; it is uncertain if this abnormality represents abscess, or severely inflamed gastric antrum and proximal duodenum. Mild to moderate gastric distention is noted suggesting gastric outlet obstruction. The second fluid collection does appear to extend to the subcapsular region inferiorly of right hepatic lobe. Sigmoid diverticulosis without inflammation. Aortic Atherosclerosis (ICD10-I70.0). Electronically Signed   By: Rosalene Colon M.D.   On: 09/21/2023 11:24   EKG: Not performed in the emergency department.  Assessment/Plan Active Problems:   Sinus bradycardia   CAD (coronary artery disease)   Essential hypertension   Rheumatoid arthritis involving multiple sites with positive rheumatoid factor (HCC)   Gastroesophageal reflux disease   Type 2 diabetes mellitus with other specified complication (HCC)   Obesity   Atrial fibrillation (HCC)   Hyperlipidemia associated with type 2 diabetes mellitus (HCC)   Intra-abdominal fluid collection Status post cholecystectomy > Patient underwent cholecystectomy on 4/16 due to LFT elevation, biliary sludge, abdominal pain. > Has had ongoing issues with abdominal pain and nausea vomiting since that time.  Seen initially in the ED on 4/27 and fluid collection was noted at that time and general surgery offered admission and HIDA scan the patient declined and left AMA. > Now with  worsening nausea vomiting abdominal pain fluid collection has enlarged and there is a second fluid collection.  Now with mass effect on gastric  outlet causing gastric outlet obstruction. > General Surgery has been consulted and are working with IR for drainage of the collection to confirm abscess.  HIDA scan was ordered to rule out bile leak. - Monitor on telemetry overnight - Appreciate general surgery and IR recommendations and assistance - General Surgery has ordered NG tube for decompression - Continue with IV Zosyn - Hold Eliquis  - Plan for IR intervention tomorrow - Trend fever curve and WBC - Supportive care  Hypertension - Continue home amlodipine  and ramipril  when confirmed  Hyperlipidemia - Continue home pravastatin   Rheumatoid arthritis - Abatacept  currently held  Diabetes - Continue home insulin  pump  GERD - Continue home PPI  Bradycardia - Noted  CAD - Continue home statin and ACE inhibitor   DVT prophylaxis: SCDs Code Status:   Full Family Communication:  Updated at bedside  Disposition Plan:   Patient is from:  Home  Anticipated DC to:  Home  Anticipated DC date:  1 to 4 days  Anticipated DC barriers: None  Consults called:  General surgery, IR Admission status:  Observation, telemetry  Severity of Illness: The appropriate patient status for this patient is OBSERVATION. Observation status is judged to be reasonable and necessary in order to provide the required intensity of service to ensure the patient's safety. The patient's presenting symptoms, physical exam findings, and initial radiographic and laboratory data in the context of their medical condition is felt to place them at decreased risk for further clinical deterioration. Furthermore, it is anticipated that the patient will be medically stable for discharge from the hospital within 2 midnights of admission.    Johnetta Nab MD [facility]  How to contact the [facility] Attending or Consulting provider 7A - 7P or covering provider during after hours 7P -7A, for this patient?   Check the care team in [facility] and look for a)  attending/consulting [facility] provider listed and b) the [facility] team listed Log into [email or web address] and use [facility] universal password to access. If you do not have the password, please contact the hospital operator. Locate the [facility] provider you are looking for under [facility] and page to a number that you can be directly reached. If you still have difficulty reaching the provider, please page the [facility] (Director on Call) for the Hospitalists listed on amion for assistance.  09/21/2023, 2:23 PM

## 2023-09-21 NOTE — ED Triage Notes (Signed)
 BIB EMS for abdominal pain and emesis starting around 7pm last night, Post op cholecystectomy approximately 2 weeks ago. Hx of HTN and cardiac stent.

## 2023-09-22 ENCOUNTER — Observation Stay (HOSPITAL_COMMUNITY)

## 2023-09-22 DIAGNOSIS — H9193 Unspecified hearing loss, bilateral: Secondary | ICD-10-CM | POA: Diagnosis present

## 2023-09-22 DIAGNOSIS — K219 Gastro-esophageal reflux disease without esophagitis: Secondary | ICD-10-CM | POA: Diagnosis present

## 2023-09-22 DIAGNOSIS — Y836 Removal of other organ (partial) (total) as the cause of abnormal reaction of the patient, or of later complication, without mention of misadventure at the time of the procedure: Secondary | ICD-10-CM | POA: Diagnosis present

## 2023-09-22 DIAGNOSIS — K3189 Other diseases of stomach and duodenum: Secondary | ICD-10-CM | POA: Diagnosis present

## 2023-09-22 DIAGNOSIS — R188 Other ascites: Secondary | ICD-10-CM | POA: Diagnosis present

## 2023-09-22 DIAGNOSIS — I2510 Atherosclerotic heart disease of native coronary artery without angina pectoris: Secondary | ICD-10-CM | POA: Diagnosis present

## 2023-09-22 DIAGNOSIS — B952 Enterococcus as the cause of diseases classified elsewhere: Secondary | ICD-10-CM | POA: Diagnosis present

## 2023-09-22 DIAGNOSIS — E669 Obesity, unspecified: Secondary | ICD-10-CM | POA: Diagnosis present

## 2023-09-22 DIAGNOSIS — K311 Adult hypertrophic pyloric stenosis: Secondary | ICD-10-CM | POA: Diagnosis present

## 2023-09-22 DIAGNOSIS — Z794 Long term (current) use of insulin: Secondary | ICD-10-CM | POA: Diagnosis not present

## 2023-09-22 DIAGNOSIS — R5381 Other malaise: Secondary | ICD-10-CM | POA: Diagnosis present

## 2023-09-22 DIAGNOSIS — M542 Cervicalgia: Secondary | ICD-10-CM | POA: Diagnosis not present

## 2023-09-22 DIAGNOSIS — Z9641 Presence of insulin pump (external) (internal): Secondary | ICD-10-CM | POA: Diagnosis present

## 2023-09-22 DIAGNOSIS — E1165 Type 2 diabetes mellitus with hyperglycemia: Secondary | ICD-10-CM | POA: Diagnosis present

## 2023-09-22 DIAGNOSIS — I4819 Other persistent atrial fibrillation: Secondary | ICD-10-CM | POA: Diagnosis present

## 2023-09-22 DIAGNOSIS — B9689 Other specified bacterial agents as the cause of diseases classified elsewhere: Secondary | ICD-10-CM | POA: Diagnosis present

## 2023-09-22 DIAGNOSIS — M0579 Rheumatoid arthritis with rheumatoid factor of multiple sites without organ or systems involvement: Secondary | ICD-10-CM | POA: Diagnosis present

## 2023-09-22 DIAGNOSIS — R001 Bradycardia, unspecified: Secondary | ICD-10-CM | POA: Diagnosis present

## 2023-09-22 DIAGNOSIS — K651 Peritoneal abscess: Secondary | ICD-10-CM | POA: Diagnosis present

## 2023-09-22 DIAGNOSIS — T8143XA Infection following a procedure, organ and space surgical site, initial encounter: Secondary | ICD-10-CM | POA: Diagnosis present

## 2023-09-22 DIAGNOSIS — M25511 Pain in right shoulder: Secondary | ICD-10-CM | POA: Diagnosis not present

## 2023-09-22 DIAGNOSIS — E785 Hyperlipidemia, unspecified: Secondary | ICD-10-CM | POA: Diagnosis present

## 2023-09-22 DIAGNOSIS — M1990 Unspecified osteoarthritis, unspecified site: Secondary | ICD-10-CM | POA: Diagnosis present

## 2023-09-22 DIAGNOSIS — I10 Essential (primary) hypertension: Secondary | ICD-10-CM | POA: Diagnosis present

## 2023-09-22 DIAGNOSIS — E1169 Type 2 diabetes mellitus with other specified complication: Secondary | ICD-10-CM | POA: Diagnosis present

## 2023-09-22 DIAGNOSIS — E11649 Type 2 diabetes mellitus with hypoglycemia without coma: Secondary | ICD-10-CM | POA: Diagnosis present

## 2023-09-22 LAB — CBC
HCT: 35.1 % — ABNORMAL LOW (ref 39.0–52.0)
Hemoglobin: 11.7 g/dL — ABNORMAL LOW (ref 13.0–17.0)
MCH: 30.2 pg (ref 26.0–34.0)
MCHC: 33.3 g/dL (ref 30.0–36.0)
MCV: 90.5 fL (ref 80.0–100.0)
Platelets: 584 10*3/uL — ABNORMAL HIGH (ref 150–400)
RBC: 3.88 MIL/uL — ABNORMAL LOW (ref 4.22–5.81)
RDW: 12.6 % (ref 11.5–15.5)
WBC: 9.4 10*3/uL (ref 4.0–10.5)
nRBC: 0 % (ref 0.0–0.2)

## 2023-09-22 LAB — BASIC METABOLIC PANEL WITH GFR
Anion gap: 11 (ref 5–15)
BUN: 18 mg/dL (ref 8–23)
CO2: 27 mmol/L (ref 22–32)
Calcium: 8.3 mg/dL — ABNORMAL LOW (ref 8.9–10.3)
Chloride: 97 mmol/L — ABNORMAL LOW (ref 98–111)
Creatinine, Ser: 1.18 mg/dL (ref 0.61–1.24)
GFR, Estimated: >60 mL/min (ref >60)
Glucose, Bld: 92 mg/dL (ref 70–99)
Potassium: 3.7 mmol/L (ref 3.5–5.1)
Sodium: 135 mmol/L (ref 135–145)

## 2023-09-22 LAB — GLUCOSE, CAPILLARY
Glucose-Capillary: 67 mg/dL — ABNORMAL LOW (ref 70–99)
Glucose-Capillary: 111 mg/dL — ABNORMAL HIGH (ref 70–99)
Glucose-Capillary: 75 mg/dL (ref 70–99)
Glucose-Capillary: 79 mg/dL (ref 70–99)
Glucose-Capillary: 140 mg/dL — ABNORMAL HIGH (ref 70–99)
Glucose-Capillary: 141 mg/dL — ABNORMAL HIGH (ref 70–99)
Glucose-Capillary: 98 mg/dL (ref 70–99)

## 2023-09-22 LAB — PROTIME-INR
INR: 1.2 (ref 0.8–1.2)
Prothrombin Time: 15.5 s — ABNORMAL HIGH (ref 11.4–15.2)

## 2023-09-22 MED ORDER — LIDOCAINE HCL (PF) 1 % IJ SOLN
10.0000 mL | Freq: Once | INTRAMUSCULAR | Status: AC
  Administered 2023-09-22: 10 mL

## 2023-09-22 MED ORDER — HYDRALAZINE HCL 20 MG/ML IJ SOLN: 10.0000 mg | Freq: Four times a day (QID) | INTRAMUSCULAR | Status: DC | PRN

## 2023-09-22 MED ORDER — LACTATED RINGERS IV SOLN: INTRAVENOUS | Status: DC

## 2023-09-22 MED ORDER — PHENOL 1.4 % MT LIQD
1.0000 | OROMUCOSAL | Status: DC | PRN
  Filled 2023-09-22: qty 177

## 2023-09-22 MED ORDER — FENTANYL CITRATE (PF) 100 MCG/2ML IJ SOLN
INTRAMUSCULAR | Status: AC | PRN
  Administered 2023-09-22 (×4): 50 ug via INTRAVENOUS

## 2023-09-22 MED ORDER — MIDAZOLAM HCL 2 MG/2ML IJ SOLN
INTRAMUSCULAR | Status: AC
  Filled 2023-09-22: qty 4

## 2023-09-22 MED ORDER — FENTANYL CITRATE (PF) 100 MCG/2ML IJ SOLN
INTRAMUSCULAR | Status: AC
  Filled 2023-09-22: qty 4

## 2023-09-22 MED ORDER — DEXTROSE 50 % IV SOLN
25.0000 mL | Freq: Once | INTRAVENOUS | Status: AC
  Administered 2023-09-22: 25 mL via INTRAVENOUS
  Filled 2023-09-22: qty 50

## 2023-09-22 MED ORDER — SODIUM CHLORIDE 0.9% FLUSH
5.0000 mL | Freq: Three times a day (TID) | INTRAVENOUS | Status: DC
Start: 2023-09-22 — End: 2023-09-25
  Administered 2023-09-22 – 2023-09-25 (×8): 5 mL

## 2023-09-22 MED ORDER — MIDAZOLAM HCL 2 MG/2ML IJ SOLN
INTRAMUSCULAR | Status: AC | PRN
  Administered 2023-09-22 (×4): 1 mg via INTRAVENOUS

## 2023-09-22 NOTE — Progress Notes (Signed)
 PROGRESS NOTE    Joel Neiswonger Nifong Sr.  JYN:[id number] DOB: 1946-04-11 DOA: 09/21/2023 PCP: [name], MD    Brief Narrative:   Joel Patches Tallon Sr. is a 78 y.o. male with past medical history significant for HTN, HLD, DM2, GERD, paroxysmal atrial fibrillation, CAD, RA, obesity who presented to [facility] ED on 09/21/2023 with abdominal pain associate with nausea and vomiting.  Onset over the last day associated with weakness and fatigue.  Recently had robotic cholecystectomy on 09/05/2023.  Denies fever, no chills, no chest pain, no shortness of breath.  Most recently seen in the ED at [facility] on 4/27 due to abdominal pain with CT showing fluid collection.  General surgery offered admission and HIDA scan but patient declined and left AMA.  In the ED, temperature 97.8 F, HR 55, RR 18, BP 132/70, SpO2 97% on room air.  WBC 10.6, hemoglobin 12.2, platelet count 626.  Sodium 137, potassium 4.3, chloride 94, CO2 28, glucose 102, BUN 20, creat 1.23.  Lipase 27, AST 34, ALT 20, total bilirubin 1.0.  On urinalysis unrevealing.  CT abdomen/pelvis with contrast with noted s/p cholecystectomy with 7.3 x 5.9 cm fluid collection with air-fluid levels gallbladder fossa which is enlarged prior to exam on 4/27 consistent with abscess or biloma, also 6.3 x 3.9 cm fluid-filled structure medial to this concerning for abscess or severely inflamed gastric antrum and proximal duodenum with mild/moderate gastric distention suggestive of gastric outlet obstruction.  General surgery was consulted.  TRH consulted for admission for further evaluation management of intra-abdominal abscess causing gastric outlet obstruction  Assessment & Plan:   RUQ Intra-abdominal abscess causing duodenal/gastric outlet obstruction Patient presenting to ED with progressive abdominal pain associate with nausea and vomiting.  Patient was afebrile with elevated WBC count of 10.6. CT abdomen/pelvis with contrast with noted s/p cholecystectomy with 7.3 x  5.9 cm fluid collection with air-fluid levels gallbladder fossa which is enlarged prior to exam on 4/27 consistent with abscess or biloma, also 6.3 x 3.9 cm fluid-filled structure medial to this concerning for abscess or severely inflamed gastric antrum and proximal duodenum with mild/moderate gastric distention suggestive of gastric outlet obstruction.  Recent robotic cholecystectomy by Dr. [name] on 09/05/2022.  Nuclear medicine hepatobiliary leak scan with no bile leak demonstrated, patent common bile duct. -- General Surgery following, appreciate assistance -- WBC 10.6>9.4 -- NGT to LWIS w/ out past 24h -- IR consulted for drain placement; NPO -- Zosyn 3.375g IV q every 8 hours  Essential hypertension At baseline on amlodipine  5 mg p.o. daily, ramipril  5 mg p.o. daily. -- Holding oral antihypertensives while NPO -- Hydralazine 10 mg IV q6h PRN SBP >165  Hyperlipidemia Currently not on statin outpatient  DM2 On insulin  pump at baseline.  GERD Hold PPI while NPO  Persistent atrial fibrillation Hx sinus bradycardia Patient underwent A-fib ablation on 05/23/2023.  Not on rate controlling medication due to bradycardia -- Holding Eliquis  while n.p.o. and needing drain placement  Rheumatoid arthritis Follows with rheumatology outpatient, Dr. [name]. On Abatacept  outpatient; on hold currently after surgery.  Obesity Complicates all facets of care   DVT prophylaxis: SCDs Start: 09/21/23 1700    Code Status: Full Code Family Communication: Updated 2 sons present at bedside this morning  Disposition Plan:  Level of care: Telemetry Surgical Status is: Observation The patient remains OBS appropriate and will d/c before 2 midnights.    Consultants:  General Surgery Interventional radiology  Procedures:  Nuclear medicine hepatobiliary scan Pending  IR drain placement  Antimicrobials:  Zosyn 5/9>>   Subjective: Patient seen examined bedside, lying in bed.   Continues with mild abdominal discomfort.  Irritated regarding NG tube and would like to know when this can be removed.  1500 mL out past 24 hours.  Pending IR drain placement this morning.  Discussed with general surgery PA as well as Dr. [name] this morning.  No further nausea or vomiting.  Denies headache, no dizziness, no chest pain, no palpitations, no shortness of breath, no fever/chills/night sweats, no diarrhea, no focal weakness, no cough/congestion, no paresthesias.  No acute events overnight per nurse staff.  Objective: Vitals:   09/21/23 2109 09/22/23 0120 09/22/23 0607 09/22/23 0908  BP: (!) 118/59 (!) 129/58 (!) 144/62 136/61  Pulse: (!) 56 (!) 58 (!) 54 (!) 54  Resp: 17 19  17   Temp: 98.7 F (37.1 C) 97.8 F (36.6 C) 98.1 F (36.7 C) (!) 97.5 F (36.4 C)  TempSrc: Oral Oral Oral Oral  SpO2: 93% (!) 89% 95% 94%    Intake/Output Summary (Last 24 hours) at 09/22/2023 1047 Last data filed at 09/22/2023 1001 Gross per 24 hour  Intake 1179.75 ml  Output 1501 ml  Net -321.25 ml   There were no vitals filed for this visit.  Examination:  Physical Exam: GEN: NAD, alert and oriented x 3, obese HEENT: NCAT, PERRL, EOMI, sclera clear, MMM PULM: CTAB w/o wheezes/crackles, normal respiratory effort, on room air CV: RRR w/o M/G/R GI: abd soft, NTND, + BS; NG tube noted in place to suction MSK: no peripheral edema, muscle strength globally intact 5/5 bilateral upper/lower extremities NEURO: CN II-XII intact, no focal deficits, sensation to light touch intact PSYCH: normal mood/affect Integumentary: dry/intact, no rashes or wounds    Data Reviewed: I have personally reviewed following labs and imaging studies  CBC: Recent Labs  Lab 09/21/23 0734 09/21/23 0739 09/22/23 0359  WBC 10.6*  --  9.4  NEUTROABS 7.8*  --   --   HGB 12.2* 13.3 11.7*  HCT 36.9* 39.0 35.1*  MCV 91.1  --  90.5  PLT 626*  --  584*   Basic Metabolic Panel: Recent Labs  Lab 09/21/23 0734  09/21/23 0739 09/22/23 0359  NA 137 135 135  K 4.3 4.2 3.7  CL 94* 95* 97*  CO2 28  --  27  GLUCOSE 102* 131* 92  BUN 20 21 18   CREATININE 1.23 1.10 1.18  CALCIUM 9.0  --  8.3*   GFR: Estimated Creatinine Clearance: 56.6 mL/min (by C-G formula based on SCr of 1.18 mg/dL). Liver Function Tests: Recent Labs  Lab 09/21/23 0734  AST 34  ALT 20  ALKPHOS 99  BILITOT 1.0  PROT 6.7  ALBUMIN 2.9*   Recent Labs  Lab 09/21/23 0734  LIPASE 27   No results for input(s): "AMMONIA" in the last 168 hours. Coagulation Profile: Recent Labs  Lab 09/22/23 0359  INR 1.2   Cardiac Enzymes: No results for input(s): "CKTOTAL", "CKMB", "CKMBINDEX", "TROPONINI" in the last 168 hours. BNP (last 3 results) No results for input(s): "PROBNP" in the last 8760 hours. HbA1C: No results for input(s): "HGBA1C" in the last 72 hours. CBG: Recent Labs  Lab 09/21/23 2113 09/22/23 0118 09/22/23 0312 09/22/23 0607 09/22/23 0909  GLUCAP 111* 67* 111* 75 79   Lipid Profile: No results for input(s): "CHOL", "HDL", "LDLCALC", "TRIG", "CHOLHDL", "LDLDIRECT" in the last 72 hours. Thyroid  Function Tests: No results for input(s): "TSH", "T4TOTAL", "FREET4", "T3FREE", "THYROIDAB" in  the last 72 hours. Anemia Panel: No results for input(s): "VITAMINB12", "FOLATE", "FERRITIN", "TIBC", "IRON", "RETICCTPCT" in the last 72 hours. Sepsis Labs: No results for input(s): "PROCALCITON", "LATICACIDVEN" in the last 168 hours.  No results found for this or any previous visit (from the past 240 hours).       Radiology Studies: DG Abd Portable 1 View Result Date: 09/21/2023 CLINICAL DATA:  NG tube placement. EXAM: PORTABLE ABDOMEN - 1 VIEW COMPARISON:  None Available. FINDINGS: A nasogastric tube is seen with its distal tip overlying the expected region of the body of the stomach. Its distal side hole sits approximately 6.5 cm distal to the expected region of the gastroesophageal junction. The bowel gas pattern  is normal. No radio-opaque calculi or other significant radiographic abnormality are seen. IMPRESSION: Nasogastric tube positioning, as described above. Electronically Signed   By: Virgle Grime M.D.   On: 09/21/2023 18:47   NM HEPATOBILIARY LEAK (POST-SURGICAL) Result Date: 09/21/2023 CLINICAL DATA:  Acute abdominal pain, rule out bile leak. Cholecystectomy 09/05/2023 EXAM: NUCLEAR MEDICINE HEPATOBILIARY IMAGING TECHNIQUE: Sequential images of the abdomen were obtained out to 60 minutes following intravenous administration of radiopharmaceutical. RADIOPHARMACEUTICALS:  Five mCi Tc-78m  Choletec IV COMPARISON:  CT abdomen 09/21/2023 FINDINGS: Satisfactory uptake of radiopharmaceutical from the blood pool. Biliary activity at 9 minutes and bowel activity at 11 minutes. We carried out imaging for 2 full hours. No abnormal extension of radiopharmaceutical to suggest bile leak. IMPRESSION: 1. No bile leak is demonstrated. We carried out observation for 2 full hours. 2. Patent common bile duct. Electronically Signed   By: Freida Jes M.D.   On: 09/21/2023 16:41   CT ABDOMEN PELVIS W CONTRAST Result Date: 09/21/2023 CLINICAL DATA:  Acute generalized abdominal pain. EXAM: CT ABDOMEN AND PELVIS WITH CONTRAST TECHNIQUE: Multidetector CT imaging of the abdomen and pelvis was performed using the standard protocol following bolus administration of intravenous contrast. RADIATION DOSE REDUCTION: This exam was performed according to the departmental dose-optimization program which includes automated exposure control, adjustment of the mA and/or kV according to patient size and/or use of iterative reconstruction technique. CONTRAST:  75mL OMNIPAQUE  IOHEXOL  350 MG/ML SOLN COMPARISON:  September 09, 2023. FINDINGS: Lower chest: No acute abnormality. Hepatobiliary: Status post cholecystectomy. 7.3 x 5.9 cm fluid collection is noted in the gallbladder fossa which is enlarged compared to prior exam which contains air-fluid  level and is highly concerning for abscess or possibly biloma. Surrounding inflammatory changes are noted. Pancreas: Unremarkable. No pancreatic ductal dilatation or surrounding inflammatory changes. Spleen: Normal in size without focal abnormality. Adrenals/Urinary Tract: Adrenal glands appear normal. Bilateral renal cysts are noted. No hydronephrosis or renal obstruction is noted. Urinary bladder is unremarkable. Stomach/Bowel: The appendix is unremarkable. Sigmoid diverticulosis is noted without inflammation. Mild to moderate gastric distention is noted concerning for gastric outlet obstruction. This appears to be due to significant wall thickening and inflammatory changes involving the gastric antrum and proximal duodenum, most secondary to adjacent inflammation and abscess in gallbladder fossa. 6.3 x 3.9 cm fluid-filled structure is seen in this area which Saur represent abscess as well, or potentially be inflamed proximal duodenum. Vascular/Lymphatic: Aortic atherosclerosis. No enlarged abdominal or pelvic lymph nodes. Reproductive: Prostate is unremarkable. Other: No hernia or ascites is noted. Musculoskeletal: No acute or significant osseous findings. IMPRESSION: Status post cholecystectomy. 7.3 x 5.9 cm fluid collection with air-fluid levels is noted in gallbladder fossa which is enlarged compared to prior exam of September 09, 2023 and is most consistent with abscess  or possibly biloma. There is also noted 6.3 x 3.9 cm fluid-filled structure medial to this; it is uncertain if this abnormality represents abscess, or severely inflamed gastric antrum and proximal duodenum. Mild to moderate gastric distention is noted suggesting gastric outlet obstruction. The second fluid collection does appear to extend to the subcapsular region inferiorly of right hepatic lobe. Sigmoid diverticulosis without inflammation. Aortic Atherosclerosis (ICD10-I70.0). Electronically Signed   By: Rosalene Colon M.D.   On: 09/21/2023  11:24        Scheduled Meds:  insulin  pump   Subcutaneous Q4H   sodium chloride  flush  3 mL Intravenous Q12H   Continuous Infusions:  piperacillin-tazobactam (ZOSYN)  IV 3.375 g (09/22/23 0638)     LOS: 0 days    Time spent: 52 minutes spent on 09/22/2023 caring for this patient face-to-face including chart review, ordering labs/tests, documenting, discussion with nursing staff, consultants, updating family and interview/physical exam    Rema Care [place], DO [facility] Available via Epic secure chat 7am-7pm After these hours, please refer to coverage provider listed on [email or web address] 09/22/2023, 10:47 AM

## 2023-09-22 NOTE — Procedures (Addendum)
 Interventional Radiology Procedure Note  Procedure: CT Guided Drainage of gallbladder fossa abscess  Complications: None  Estimated Blood Loss: < 10 mL  Findings: 10 Fr drain placed in gallbladder fossa abscess with return of purulent fluid. Fluid sample sent for culture analysis. Drain attached to suction bulb drainage. After several minutes of drainage, the second adjacent collection adjacent to the proximal duodenum appeared potentially slightly smaller, so second aspiration/drain not performed at this time.  Will follow.  Nonda Bays. Nereida Banning, M.D Pager:  [phone]

## 2023-09-22 NOTE — Plan of Care (Signed)

## 2023-09-22 NOTE — Progress Notes (Signed)
 Progress Note     Subjective: Feeling better since NGT placement with no n/v and abdominal pain improved to resolved. Passing flatus  Sons at bedside  Objective: Vital signs in last 24 hours: Temp:  [97.8 F (36.6 C)-98.7 F (37.1 C)] 98.1 F (36.7 C) (05/10 0607) Pulse Rate:  [49-65] 54 (05/10 0607) Resp:  [9-21] 19 (05/10 0120) BP: (118-155)/(57-73) 144/62 (05/10 0607) SpO2:  [89 %-98 %] 95 % (05/10 0607) Last BM Date : 09/20/23  Intake/Output from previous day: 05/09 0701 - 05/10 0700 In: 1176.8 [I.V.:1176.8] Out: 1501 [Urine:1; Emesis/NG output:1500] Intake/Output this shift: No intake/output data recorded.  PE: General: pleasant, WD, male who is laying in bed in NAD Lungs:  Respiratory effort nonlabored Abd: soft, NT, very mild distension. Incisions cdi MSK: all 4 extremities are symmetrical with no cyanosis, clubbing, or edema. Skin: warm and dry Psych: A&Ox3 with an appropriate affect.    Lab Results:  Recent Labs    09/21/23 0734 09/21/23 0739 09/22/23 0359  WBC 10.6*  --  9.4  HGB 12.2* 13.3 11.7*  HCT 36.9* 39.0 35.1*  PLT 626*  --  584*   BMET Recent Labs    09/21/23 0734 09/21/23 0739 09/22/23 0359  NA 137 135 135  K 4.3 4.2 3.7  CL 94* 95* 97*  CO2 28  --  27  GLUCOSE 102* 131* 92  BUN 20 21 18   CREATININE 1.23 1.10 1.18  CALCIUM 9.0  --  8.3*   PT/INR Recent Labs    09/22/23 0359  LABPROT 15.5*  INR 1.2   CMP     Component Value Date/Time   NA 135 09/22/2023 0359   NA 142 08/17/2023 1413   K 3.7 09/22/2023 0359   CL 97 (L) 09/22/2023 0359   CO2 27 09/22/2023 0359   GLUCOSE 92 09/22/2023 0359   BUN 18 09/22/2023 0359   BUN 17 08/17/2023 1413   CREATININE 1.18 09/22/2023 0359   CREATININE 1.20 10/03/2016 1711   CALCIUM 8.3 (L) 09/22/2023 0359   PROT 6.7 09/21/2023 0734   PROT 6.3 08/17/2023 1413   ALBUMIN 2.9 (L) 09/21/2023 0734   ALBUMIN 4.5 08/17/2023 1413   AST 34 09/21/2023 0734   ALT 20 09/21/2023 0734    ALKPHOS 99 09/21/2023 0734   BILITOT 1.0 09/21/2023 0734   BILITOT 1.6 (H) 08/17/2023 1413   GFRNONAA >60 09/22/2023 0359   GFRNONAA 61 10/03/2016 1711   GFRAA >60 01/26/2020 0903   GFRAA 71 10/03/2016 1711   Lipase     Component Value Date/Time   LIPASE 27 09/21/2023 0734       Studies/Results: DG Abd Portable 1 View Result Date: 09/21/2023 CLINICAL DATA:  NG tube placement. EXAM: PORTABLE ABDOMEN - 1 VIEW COMPARISON:  None Available. FINDINGS: A nasogastric tube is seen with its distal tip overlying the expected region of the body of the stomach. Its distal side hole sits approximately 6.5 cm distal to the expected region of the gastroesophageal junction. The bowel gas pattern is normal. No radio-opaque calculi or other significant radiographic abnormality are seen. IMPRESSION: Nasogastric tube positioning, as described above. Electronically Signed   By: Virgle Grime M.D.   On: 09/21/2023 18:47   NM HEPATOBILIARY LEAK (POST-SURGICAL) Result Date: 09/21/2023 CLINICAL DATA:  Acute abdominal pain, rule out bile leak. Cholecystectomy 09/05/2023 EXAM: NUCLEAR MEDICINE HEPATOBILIARY IMAGING TECHNIQUE: Sequential images of the abdomen were obtained out to 60 minutes following intravenous administration of radiopharmaceutical. RADIOPHARMACEUTICALS:  Five mCi Tc-10m  Choletec IV COMPARISON:  CT abdomen 09/21/2023 FINDINGS: Satisfactory uptake of radiopharmaceutical from the blood pool. Biliary activity at 9 minutes and bowel activity at 11 minutes. We carried out imaging for 2 full hours. No abnormal extension of radiopharmaceutical to suggest bile leak. IMPRESSION: 1. No bile leak is demonstrated. We carried out observation for 2 full hours. 2. Patent common bile duct. Electronically Signed   By: Freida Jes M.D.   On: 09/21/2023 16:41   CT ABDOMEN PELVIS W CONTRAST Result Date: 09/21/2023 CLINICAL DATA:  Acute generalized abdominal pain. EXAM: CT ABDOMEN AND PELVIS WITH CONTRAST  TECHNIQUE: Multidetector CT imaging of the abdomen and pelvis was performed using the standard protocol following bolus administration of intravenous contrast. RADIATION DOSE REDUCTION: This exam was performed according to the departmental dose-optimization program which includes automated exposure control, adjustment of the mA and/or kV according to patient size and/or use of iterative reconstruction technique. CONTRAST:  75mL OMNIPAQUE  IOHEXOL  350 MG/ML SOLN COMPARISON:  September 09, 2023. FINDINGS: Lower chest: No acute abnormality. Hepatobiliary: Status post cholecystectomy. 7.3 x 5.9 cm fluid collection is noted in the gallbladder fossa which is enlarged compared to prior exam which contains air-fluid level and is highly concerning for abscess or possibly biloma. Surrounding inflammatory changes are noted. Pancreas: Unremarkable. No pancreatic ductal dilatation or surrounding inflammatory changes. Spleen: Normal in size without focal abnormality. Adrenals/Urinary Tract: Adrenal glands appear normal. Bilateral renal cysts are noted. No hydronephrosis or renal obstruction is noted. Urinary bladder is unremarkable. Stomach/Bowel: The appendix is unremarkable. Sigmoid diverticulosis is noted without inflammation. Mild to moderate gastric distention is noted concerning for gastric outlet obstruction. This appears to be due to significant wall thickening and inflammatory changes involving the gastric antrum and proximal duodenum, most secondary to adjacent inflammation and abscess in gallbladder fossa. 6.3 x 3.9 cm fluid-filled structure is seen in this area which Birks represent abscess as well, or potentially be inflamed proximal duodenum. Vascular/Lymphatic: Aortic atherosclerosis. No enlarged abdominal or pelvic lymph nodes. Reproductive: Prostate is unremarkable. Other: No hernia or ascites is noted. Musculoskeletal: No acute or significant osseous findings. IMPRESSION: Status post cholecystectomy. 7.3 x 5.9 cm fluid  collection with air-fluid levels is noted in gallbladder fossa which is enlarged compared to prior exam of September 09, 2023 and is most consistent with abscess or possibly biloma. There is also noted 6.3 x 3.9 cm fluid-filled structure medial to this; it is uncertain if this abnormality represents abscess, or severely inflamed gastric antrum and proximal duodenum. Mild to moderate gastric distention is noted suggesting gastric outlet obstruction. The second fluid collection does appear to extend to the subcapsular region inferiorly of right hepatic lobe. Sigmoid diverticulosis without inflammation. Aortic Atherosclerosis (ICD10-I70.0). Electronically Signed   By: Rosalene Colon M.D.   On: 09/21/2023 11:24    Anti-infectives: Anti-infectives (From admission, onward)    Start     Dose/Rate Route Frequency Ordered Stop   09/21/23 1445  piperacillin-tazobactam (ZOSYN) IVPB 3.375 g        3.375 g 12.5 mL/hr over 240 Minutes Intravenous Every 8 hours 09/21/23 1437          Assessment/Plan  Intraabdominal fluid collections  7.3 x 5.9 cm fluid collection in GB fossa 6.3 x 3.9 cm fluid collection RUQ abscess causing duodenal/gastric outlet obstruction POD 17 s/p Robotic chole Dr. [name] 4/23   - NGT with 1500 ml/24h. Passing flatus. Continue LIWS today - IR consulted for drain placement today - continue iv abx  FEN: NPO, NGT  LIWS, IVF per primary ID: [email or web address] VTE: hold Eliquis , ok for heparin  gtt if needed.   -per TRH- DM - insulin  pump A fib - hold eliquis  GERD HTN HLD RA  I reviewed hospitalist notes, last 24 h vitals and pain scores, last 48 h intake and output, last 24 h labs and trends, and last 24 h imaging results.    LOS: 0 days   Elwin Hammond, [facility] 09/22/2023, 8:08 AM Please see Amion for pager number during day hours 7:00am-4:30pm

## 2023-09-22 NOTE — Care Management Obs Status (Signed)
 MEDICARE OBSERVATION STATUS NOTIFICATION   Patient Details  Name: Joel [name] MRN: [id number] Date of Birth: 06/20/1945   Medicare Observation Status Notification Given:  Yes    Jannine Meo, RN 09/22/2023, 1:21 PM

## 2023-09-22 NOTE — Progress Notes (Addendum)
 Notified of patient's admission to [facility].  [facility] Surgical input.  HIDA scan negative for leak.  Suspect intraabdominal abscess.  Will be more than happy to see patient in follow up as an outpatient upon discharge.  [name], MD [facility]

## 2023-09-23 ENCOUNTER — Encounter: Payer: Self-pay | Admitting: Internal Medicine

## 2023-09-23 DIAGNOSIS — R188 Other ascites: Secondary | ICD-10-CM | POA: Diagnosis not present

## 2023-09-23 LAB — CBC
HCT: 33.5 % — ABNORMAL LOW (ref 39.0–52.0)
Hemoglobin: 11.4 g/dL — ABNORMAL LOW (ref 13.0–17.0)
MCH: 30.3 pg (ref 26.0–34.0)
MCHC: 34 g/dL (ref 30.0–36.0)
MCV: 89.1 fL (ref 80.0–100.0)
Platelets: 596 10*3/uL — ABNORMAL HIGH (ref 150–400)
RBC: 3.76 MIL/uL — ABNORMAL LOW (ref 4.22–5.81)
RDW: 12.6 % (ref 11.5–15.5)
WBC: 9.9 10*3/uL (ref 4.0–10.5)
nRBC: 0 % (ref 0.0–0.2)

## 2023-09-23 LAB — BASIC METABOLIC PANEL WITH GFR
Anion gap: 10 (ref 5–15)
BUN: 14 mg/dL (ref 8–23)
CO2: 29 mmol/L (ref 22–32)
Calcium: 8.6 mg/dL — ABNORMAL LOW (ref 8.9–10.3)
Chloride: 98 mmol/L (ref 98–111)
Creatinine, Ser: 1.08 mg/dL (ref 0.61–1.24)
GFR, Estimated: >60 mL/min (ref >60)
Glucose, Bld: 67 mg/dL — ABNORMAL LOW (ref 70–99)
Potassium: 3.5 mmol/L (ref 3.5–5.1)
Sodium: 137 mmol/L (ref 135–145)

## 2023-09-23 LAB — GLUCOSE, CAPILLARY
Glucose-Capillary: 132 mg/dL — ABNORMAL HIGH (ref 70–99)
Glucose-Capillary: 197 mg/dL — ABNORMAL HIGH (ref 70–99)
Glucose-Capillary: 199 mg/dL — ABNORMAL HIGH (ref 70–99)
Glucose-Capillary: 254 mg/dL — ABNORMAL HIGH (ref 70–99)
Glucose-Capillary: 400 mg/dL — ABNORMAL HIGH (ref 70–99)
Glucose-Capillary: 56 mg/dL — ABNORMAL LOW (ref 70–99)
Glucose-Capillary: 69 mg/dL — ABNORMAL LOW (ref 70–99)
Glucose-Capillary: 84 mg/dL (ref 70–99)

## 2023-09-23 MED ORDER — DEXTROSE 50 % IV SOLN
12.5000 g | INTRAVENOUS | Status: AC
  Administered 2023-09-23: 12.5 g via INTRAVENOUS
  Filled 2023-09-23: qty 50

## 2023-09-23 MED ORDER — DEXTROSE IN LACTATED RINGERS 5 % IV SOLN
INTRAVENOUS | Status: DC
Start: 2023-09-23 — End: 2023-09-24

## 2023-09-23 MED ORDER — METHOCARBAMOL 1000 MG/10ML IJ SOLN
1000.0000 mg | Freq: Three times a day (TID) | INTRAMUSCULAR | Status: DC
  Administered 2023-09-23 – 2023-09-25 (×6): 1000 mg via INTRAVENOUS
  Filled 2023-09-23 (×6): qty 10

## 2023-09-23 MED ORDER — DEXTROSE 50 % IV SOLN
INTRAVENOUS | Status: AC
  Administered 2023-09-23: 50 mL
  Filled 2023-09-23: qty 50

## 2023-09-23 MED ORDER — GLUCOSE 4 G PO CHEW
CHEWABLE_TABLET | ORAL | Status: AC
  Filled 2023-09-23: qty 1

## 2023-09-23 NOTE — Plan of Care (Signed)

## 2023-09-23 NOTE — Plan of Care (Signed)
  Problem: Clinical Measurements: Goal: Diagnostic test results will improve Outcome: Progressing   Problem: Nutrition: Goal: Adequate nutrition will be maintained Outcome: Progressing   Problem: Pain Managment: Goal: General experience of comfort will improve and/or be controlled Outcome: Progressing   Problem: Safety: Goal: Ability to remain free from injury will improve Outcome: Progressing   Problem: Skin Integrity: Goal: Risk for impaired skin integrity will decrease Outcome: Progressing   Problem: Coping: Goal: Level of anxiety will decrease Outcome: Completed/Met

## 2023-09-23 NOTE — Progress Notes (Signed)
 Progress Note     Subjective: Having pain into right shoulder that is intermittent since drain placement. No CP and does not radiate down arm. Passing flatus. No n/v/abdominal distension or significant abdominal pain  Sons at bedside  Objective: Vital signs in last 24 hours: Temp:  [97.5 F (36.4 C)-99.4 F (37.4 C)] 98.4 F (36.9 C) (05/11 0806) Pulse Rate:  [53-83] 53 (05/11 0806) Resp:  [15-20] 16 (05/11 0806) BP: (126-151)/(57-77) 140/57 (05/11 0806) SpO2:  [91 %-98 %] 91 % (05/11 0806) FiO2 (%):  [16 %] 16 % (05/10 1954) Last BM Date : 09/20/23  Intake/Output from previous day: 05/10 0701 - 05/11 0700 In: 374 [I.V.:246.2; IV Piggyback:127.9] Out: 1215 [Urine:260; Emesis/NG output:850; Drains:105] Intake/Output this shift: No intake/output data recorded.  PE: General: pleasant, WD, male who is laying in bed in NAD Lungs:  Respiratory effort nonlabored Abd: soft, NT, very mild distension. Incisions cdi. NGT with thin yellow drainage - initially on my exam the suction tubing is connected to the sump port. IR drain with blood tinged purulent drainage MSK: all 4 extremities are symmetrical with no cyanosis, clubbing, or edema. Skin: warm and dry Psych: A&Ox3 with an appropriate affect.    Lab Results:  Recent Labs    09/22/23 0359 09/23/23 0547  WBC 9.4 9.9  HGB 11.7* 11.4*  HCT 35.1* 33.5*  PLT 584* 596*   BMET Recent Labs    09/22/23 0359 09/23/23 0547  NA 135 137  K 3.7 3.5  CL 97* 98  CO2 27 29  GLUCOSE 92 67*  BUN 18 14  CREATININE 1.18 1.08  CALCIUM 8.3* 8.6*   PT/INR Recent Labs    09/22/23 0359  LABPROT 15.5*  INR 1.2   CMP     Component Value Date/Time   NA 137 09/23/2023 0547   NA 142 08/17/2023 1413   K 3.5 09/23/2023 0547   CL 98 09/23/2023 0547   CO2 29 09/23/2023 0547   GLUCOSE 67 (L) 09/23/2023 0547   BUN 14 09/23/2023 0547   BUN 17 08/17/2023 1413   CREATININE 1.08 09/23/2023 0547   CREATININE 1.20 10/03/2016 1711    CALCIUM 8.6 (L) 09/23/2023 0547   PROT 6.7 09/21/2023 0734   PROT 6.3 08/17/2023 1413   ALBUMIN 2.9 (L) 09/21/2023 0734   ALBUMIN 4.5 08/17/2023 1413   AST 34 09/21/2023 0734   ALT 20 09/21/2023 0734   ALKPHOS 99 09/21/2023 0734   BILITOT 1.0 09/21/2023 0734   BILITOT 1.6 (H) 08/17/2023 1413   GFRNONAA >60 09/23/2023 0547   GFRNONAA 61 10/03/2016 1711   GFRAA >60 01/26/2020 0903   GFRAA 71 10/03/2016 1711   Lipase     Component Value Date/Time   LIPASE 27 09/21/2023 0734       Studies/Results: CT GUIDED PERITONEAL/RETROPERITONEAL FLUID DRAIN BY PERC CATH Result Date: 09/22/2023 CLINICAL DATA:  Gallbladder fossa abscess. Status post cholecystectomy on 09/05/2023. EXAM: CT GUIDED CATHETER DRAINAGE OF PERITONEAL ABSCESS ANESTHESIA/SEDATION: Moderate (conscious) sedation was employed during this procedure. A total of Versed  4.0 mg and Fentanyl  200 mcg was administered intravenously. Moderate Sedation Time: 30 minutes. The patient's level of consciousness and vital signs were monitored continuously by radiology nursing throughout the procedure under my direct supervision. PROCEDURE: The procedure, risks, benefits, and alternatives were explained to the patient. Questions regarding the procedure were encouraged and answered. The patient understands and consents to the procedure. A time out was performed prior to initiating the procedure. The abdominal wall was prepped  with chlorhexidine  in a sterile fashion, and a sterile drape was applied covering the operative field. A sterile gown and sterile gloves were used for the procedure. Local anesthesia was provided with 1% Lidocaine . CT was performed in a supine position. From a right anterolateral approach, an 18 gauge trocar needle was advanced into a gallbladder fossa abscess. After return of fluid, a guidewire was advanced into the collection, the tract dilated and a 10 French percutaneous drain advanced over the wire. The drain was formed and  additional 20 mL fluid sample withdrawn for culture analysis. The drain was flushed with sterile saline and attached to a suction bulb. It was secured at the skin with a Prolene retention suture and adhesive StatLock device. RADIATION DOSE REDUCTION: This exam was performed according to the departmental dose-optimization program which includes automated exposure control, adjustment of the mA and/or kV according to patient size and/or use of iterative reconstruction technique. COMPLICATIONS: None FINDINGS: The gallbladder fossa abscess containing fluid and air yielded grossly purulent fluid. After drain placement there is good return of purulent fluid. A fluid sample was sent for culture analysis. There was a limited window for approach to the second probable complex fluid collection abutting the proximal duodenum. After drain placement and partial decompression of the gallbladder fossa abscess, it appeared that the adjacent collection Temple[place] be slightly smaller and therefore it was chosen to not perform a second aspiration/drainage procedure at this time and watch this collection. IMPRESSION: CT-guided percutaneous catheter drainage of gallbladder fossa abscess yielding purulent fluid. A 10 French drain was placed and attached to suction bulb drainage. A sample of purulent fluid was sent for culture analysis. A second adjacent fluid collection near the proximal duodenum appeared potentially slightly smaller after partial decompression of the gallbladder fossa abscess so a second aspiration drainage procedure was not performed at this time. Electronically Signed   By: Erica Hau M.D.   On: 09/22/2023 12:59   DG Abd Portable 1 View Result Date: 09/21/2023 CLINICAL DATA:  NG tube placement. EXAM: PORTABLE ABDOMEN - 1 VIEW COMPARISON:  None Available. FINDINGS: A nasogastric tube is seen with its distal tip overlying the expected region of the body of the stomach. Its distal side hole sits approximately 6.5 cm  distal to the expected region of the gastroesophageal junction. The bowel gas pattern is normal. No radio-opaque calculi or other significant radiographic abnormality are seen. IMPRESSION: Nasogastric tube positioning, as described above. Electronically Signed   By: Virgle Grime M.D.   On: 09/21/2023 18:47   NM HEPATOBILIARY LEAK (POST-SURGICAL) Result Date: 09/21/2023 CLINICAL DATA:  Acute abdominal pain, rule out bile leak. Cholecystectomy 09/05/2023 EXAM: NUCLEAR MEDICINE HEPATOBILIARY IMAGING TECHNIQUE: Sequential images of the abdomen were obtained out to 60 minutes following intravenous administration of radiopharmaceutical. RADIOPHARMACEUTICALS:  Five mCi Tc-81m  Choletec IV COMPARISON:  CT abdomen 09/21/2023 FINDINGS: Satisfactory uptake of radiopharmaceutical from the blood pool. Biliary activity at 9 minutes and bowel activity at 11 minutes. We carried out imaging for 2 full hours. No abnormal extension of radiopharmaceutical to suggest bile leak. IMPRESSION: 1. No bile leak is demonstrated. We carried out observation for 2 full hours. 2. Patent common bile duct. Electronically Signed   By: Freida Jes M.D.   On: 09/21/2023 16:41   CT ABDOMEN PELVIS W CONTRAST Result Date: 09/21/2023 CLINICAL DATA:  Acute generalized abdominal pain. EXAM: CT ABDOMEN AND PELVIS WITH CONTRAST TECHNIQUE: Multidetector CT imaging of the abdomen and pelvis was performed using the standard protocol following bolus  administration of intravenous contrast. RADIATION DOSE REDUCTION: This exam was performed according to the departmental dose-optimization program which includes automated exposure control, adjustment of the mA and/or kV according to patient size and/or use of iterative reconstruction technique. CONTRAST:  75mL OMNIPAQUE  IOHEXOL  350 MG/ML SOLN COMPARISON:  September 09, 2023. FINDINGS: Lower chest: No acute abnormality. Hepatobiliary: Status post cholecystectomy. 7.3 x 5.9 cm fluid collection is noted in the  gallbladder fossa which is enlarged compared to prior exam which contains air-fluid level and is highly concerning for abscess or possibly biloma. Surrounding inflammatory changes are noted. Pancreas: Unremarkable. No pancreatic ductal dilatation or surrounding inflammatory changes. Spleen: Normal in size without focal abnormality. Adrenals/Urinary Tract: Adrenal glands appear normal. Bilateral renal cysts are noted. No hydronephrosis or renal obstruction is noted. Urinary bladder is unremarkable. Stomach/Bowel: The appendix is unremarkable. Sigmoid diverticulosis is noted without inflammation. Mild to moderate gastric distention is noted concerning for gastric outlet obstruction. This appears to be due to significant wall thickening and inflammatory changes involving the gastric antrum and proximal duodenum, most secondary to adjacent inflammation and abscess in gallbladder fossa. 6.3 x 3.9 cm fluid-filled structure is seen in this area which Fabel represent abscess as well, or potentially be inflamed proximal duodenum. Vascular/Lymphatic: Aortic atherosclerosis. No enlarged abdominal or pelvic lymph nodes. Reproductive: Prostate is unremarkable. Other: No hernia or ascites is noted. Musculoskeletal: No acute or significant osseous findings. IMPRESSION: Status post cholecystectomy. 7.3 x 5.9 cm fluid collection with air-fluid levels is noted in gallbladder fossa which is enlarged compared to prior exam of September 09, 2023 and is most consistent with abscess or possibly biloma. There is also noted 6.3 x 3.9 cm fluid-filled structure medial to this; it is uncertain if this abnormality represents abscess, or severely inflamed gastric antrum and proximal duodenum. Mild to moderate gastric distention is noted suggesting gastric outlet obstruction. The second fluid collection does appear to extend to the subcapsular region inferiorly of right hepatic lobe. Sigmoid diverticulosis without inflammation. Aortic Atherosclerosis  (ICD10-I70.0). Electronically Signed   By: Rosalene Colon M.D.   On: 09/21/2023 11:24    Anti-infectives: Anti-infectives (From admission, onward)    Start     Dose/Rate Route Frequency Ordered Stop   09/21/23 1445  piperacillin-tazobactam (ZOSYN) IVPB 3.375 g        3.375 g 12.5 mL/hr over 240 Minutes Intravenous Every 8 hours 09/21/23 1437          Assessment/Plan  Intraabdominal fluid collections  7.3 x 5.9 cm fluid collection in GB fossa 6.3 x 3.9 cm fluid collection RUQ abscess causing duodenal/gastric outlet obstruction POD 17 s/p Robotic chole Dr. [name] 4/23   - NGT with 850 ml/24h but thin this am. Passing flatus. NGT out today and allow clears - IR drain placement x1 5/10 (there appeared to be connection between abscesses) with 105 ml out total - drain culture with mod gram pos rods so far. Continue to follow - continue iv abx  FEN: Clears, IVF per primary ID: [email or web address] VTE: hold Eliquis , ok for heparin  gtt if needed.   -per [facility]- DM - insulin  pump A fib - hold eliquis  GERD HTN HLD RA  I reviewed hospitalist notes, last 24 h vitals and pain scores, last 48 h intake and output, last 24 h labs and trends, and last 24 h imaging results.    LOS: 1 day   Elwin Hammond, [facility] 09/23/2023, 8:14 AM Please see Amion for pager number during day hours 7:00am-4:30pm

## 2023-09-23 NOTE — Progress Notes (Signed)
 Patient called nurses' station and said, "I feel like my blood sugar is low." Notified Stephen Ehrlich, NT to check pt's CBG. Per NT pt's CBG was 56. Notified pt's assigned nurse, Selena Daily, LPN and CN Donzetta Galli, RN. Hypoglycemic protocol initiated. Dr. Eric [place] at bedside. Started D5 w LR infusion at 75 mL/hr per MD orders. Pt's nurse at bedside to administer D50 IV push per hypoglycemic protocol. NT will recheck CBG.

## 2023-09-23 NOTE — Progress Notes (Signed)
 Hypoglycemic Event  CBG: 56  Treatment: D50 25mL (12.5gm) IV push by assigned nurse, Selena Daily, LPN. Start D5 LR infusion 75 mL/hr per MD orders.   Symptoms: shaky and feeling dizzy per pt  Follow-up CBG: Time: 0845 CBG Result:199  Possible Reasons for Event: pt NPO with NGT  Comments/MD notified:Dr. Eric [place], CN Donzetta Galli, RN and Selena Daily, LPN all are at bedside    Joel Mackie, LPN

## 2023-09-23 NOTE — Progress Notes (Signed)
 Patient's blood sugar was 57. 5% Dextrose  in LR started. Hypoglycemia protocol started. MD notified and dextrose  50% was given. Will continue to monitor patient.

## 2023-09-23 NOTE — Progress Notes (Signed)
 Patient's NG tube has been removed per PA Kabrich order. Tolerated well.

## 2023-09-23 NOTE — Progress Notes (Signed)
 PROGRESS NOTE    Joel Faler Dumais Sr.  EXB:[id number] DOB: 02-25-1946 DOA: 09/21/2023 PCP: [name], MD    Brief Narrative:   Joel Patches Perrell Sr. is a 78 y.o. male with past medical history significant for HTN, HLD, DM2, GERD, paroxysmal atrial fibrillation, CAD, RA, obesity who presented to [facility] ED on 09/21/2023 with abdominal pain associate with nausea and vomiting.  Onset over the last day associated with weakness and fatigue.  Recently had robotic cholecystectomy on 09/05/2023.  Denies fever, no chills, no chest pain, no shortness of breath.  Most recently seen in the ED at [facility] on 4/27 due to abdominal pain with CT showing fluid collection.  General surgery offered admission and HIDA scan but patient declined and left AMA.  In the ED, temperature 97.8 F, HR 55, RR 18, BP 132/70, SpO2 97% on room air.  WBC 10.6, hemoglobin 12.2, platelet count 626.  Sodium 137, potassium 4.3, chloride 94, CO2 28, glucose 102, BUN 20, creat 1.23.  Lipase 27, AST 34, ALT 20, total bilirubin 1.0.  On urinalysis unrevealing.  CT abdomen/pelvis with contrast with noted s/p cholecystectomy with 7.3 x 5.9 cm fluid collection with air-fluid levels gallbladder fossa which is enlarged prior to exam on 4/27 consistent with abscess or biloma, also 6.3 x 3.9 cm fluid-filled structure medial to this concerning for abscess or severely inflamed gastric antrum and proximal duodenum with mild/moderate gastric distention suggestive of gastric outlet obstruction.  General surgery was consulted.  TRH consulted for admission for further evaluation management of intra-abdominal abscess causing gastric outlet obstruction  Assessment & Plan:   RUQ Intra-abdominal abscess causing duodenal/gastric outlet obstruction Patient presenting to ED with progressive abdominal pain associate with nausea and vomiting.  Patient was afebrile with elevated WBC count of 10.6. CT abdomen/pelvis with contrast with noted s/p cholecystectomy with 7.3 x  5.9 cm fluid collection with air-fluid levels gallbladder fossa which is enlarged prior to exam on 4/27 consistent with abscess or biloma, also 6.3 x 3.9 cm fluid-filled structure medial to this concerning for abscess or severely inflamed gastric antrum and proximal duodenum with mild/moderate gastric distention suggestive of gastric outlet obstruction.  Recent robotic cholecystectomy by Dr. [name] on 09/05/2022.  Nuclear medicine hepatobiliary leak scan with no bile leak demonstrated, patent common bile duct.  Interventional radiology was consulted and patient underwent drain placement on 09/22/2023 -- General Surgery following, appreciate assistance -- WBC 10.6>9.4>9.9 -- NGT to LWIS w/ 850 mL out past 24h -- Abdominal drain with 105 mL out past 24 hours -- Drain culture: + Moderate gram-positive rods, further identification and susceptibilities pending -- Zosyn 3.375g IV q every 8 hours -- CBC in am  Essential hypertension At baseline on amlodipine  5 mg p.o. daily, ramipril  5 mg p.o. daily. -- Holding oral antihypertensives while NPO -- Hydralazine 10 mg IV q6h PRN SBP >165  Hyperlipidemia Currently not on statin outpatient  Hyperglycemia DM2 On insulin  pump at baseline.  Patient with hypoglycemic episode this morning, likely secondary to prolonged n.p.o. status.  Received D50 IV this morning. -- D5 LR at 75 mL/h -- CBGs every 4 hours while NPO  GERD Hold PPI while NPO  Persistent atrial fibrillation Hx sinus bradycardia Patient underwent A-fib ablation on 05/23/2023.  Not on rate controlling medication due to bradycardia -- Holding Eliquis  while n.p.o.   Rheumatoid arthritis Follows with rheumatology outpatient, Dr. [name]. On Abatacept  outpatient; on hold currently after surgery.  Obesity Complicates all facets of care   DVT  prophylaxis: SCDs Start: 09/21/23 1700    Code Status: Full Code Family Communication: Updated patient's son-in-law present at bedside this  morning  Disposition Plan:  Level of care: Med-Surg Status is: Inpatient Remains inpatient appropriate because: IV antibiotics, continues to be n.p.o. with NG tube in place, awaiting further recommendations from general surgery; will need placement on diet with advancement with toleration before stable for discharge home     Consultants:  General Surgery Interventional radiology  Procedures:  Nuclear medicine hepatobiliary scan IR intra-abdominal drain placement, 5/10  Antimicrobials:  Zosyn 5/9>>   Subjective: Patient seen examined bedside, lying in bed.  Reports he feels "unwell".  Hypoglycemia this morning, received IV D50.  Etiology likely secondary to prolonged n.p.o. status with history of type 2 diabetes mellitus.  Continues with NG tube in place with 850 mL out past 24 hours.  Abdominal drain placed yesterday with 105 mL out since placement.  Remains on IV Zosyn.  Discussed with general surgery PA this morning. Denies headache, no dizziness, no chest pain, no palpitations, no shortness of breath, no fever/chills/night sweats, no nausea/vomiting/diarrhea, no focal weakness, no cough/congestion, no paresthesias.  No other acute concerns this morning or acute events overnight per nursing staff.  Objective: Vitals:   09/22/23 1604 09/22/23 1954 09/23/23 0424 09/23/23 0806  BP: (!) 146/61 (!) 144/77 (!) 128/59 (!) 140/57  Pulse: 74 83 (!) 56 (!) 53  Resp: 17 16 18 16   Temp: 98.5 F (36.9 C) 98.2 F (36.8 C) 98.6 F (37 C) 98.4 F (36.9 C)  TempSrc: Oral Oral Oral Oral  SpO2: 91% 92% 93% 91%    Intake/Output Summary (Last 24 hours) at 09/23/2023 1012 Last data filed at 09/23/2023 0610 Gross per 24 hour  Intake 371.03 ml  Output 1215 ml  Net -843.97 ml   There were no vitals filed for this visit.  Examination:  Physical Exam: GEN: NAD, alert and oriented x 3, obese HEENT: NCAT, PERRL, EOMI, sclera clear, MMM PULM: CTAB w/o wheezes/crackles, normal respiratory  effort, on room air CV: RRR w/o M/G/R GI: abd soft, NTND, + BS; NG tube noted in place to suction; abdominal drain noted MSK: no peripheral edema, muscle strength globally intact 5/5 bilateral upper/lower extremities NEURO: CN II-XII intact, no focal deficits, sensation to light touch intact PSYCH: normal mood/affect Integumentary: dry/intact, no rashes or wounds    Data Reviewed: I have personally reviewed following labs and imaging studies  CBC: Recent Labs  Lab 09/21/23 0734 09/21/23 0739 09/22/23 0359 09/23/23 0547  WBC 10.6*  --  9.4 9.9  NEUTROABS 7.8*  --   --   --   HGB 12.2* 13.3 11.7* 11.4*  HCT 36.9* 39.0 35.1* 33.5*  MCV 91.1  --  90.5 89.1  PLT 626*  --  584* 596*   Basic Metabolic Panel: Recent Labs  Lab 09/21/23 0734 09/21/23 0739 09/22/23 0359 09/23/23 0547  NA 137 135 135 137  K 4.3 4.2 3.7 3.5  CL 94* 95* 97* 98  CO2 28  --  27 29  GLUCOSE 102* 131* 92 67*  BUN 20 21 18 14   CREATININE 1.23 1.10 1.18 1.08  CALCIUM 9.0  --  8.3* 8.6*   GFR: CrCl cannot be calculated (Unknown ideal weight.). Liver Function Tests: Recent Labs  Lab 09/21/23 0734  AST 34  ALT 20  ALKPHOS 99  BILITOT 1.0  PROT 6.7  ALBUMIN 2.9*   Recent Labs  Lab 09/21/23 0734  LIPASE 27   No  results for input(s): "AMMONIA" in the last 168 hours. Coagulation Profile: Recent Labs  Lab 09/22/23 0359  INR 1.2   Cardiac Enzymes: No results for input(s): "CKTOTAL", "CKMB", "CKMBINDEX", "TROPONINI" in the last 168 hours. BNP (last 3 results) No results for input(s): "PROBNP" in the last 8760 hours. HbA1C: No results for input(s): "HGBA1C" in the last 72 hours. CBG: Recent Labs  Lab 09/23/23 0010 09/23/23 0052 09/23/23 0426 09/23/23 0802 09/23/23 0846  GLUCAP 69* 132* 84 56* 199*   Lipid Profile: No results for input(s): "CHOL", "HDL", "LDLCALC", "TRIG", "CHOLHDL", "LDLDIRECT" in the last 72 hours. Thyroid  Function Tests: No results for input(s): "TSH",  "T4TOTAL", "FREET4", "T3FREE", "THYROIDAB" in the last 72 hours. Anemia Panel: No results for input(s): "VITAMINB12", "FOLATE", "FERRITIN", "TIBC", "IRON", "RETICCTPCT" in the last 72 hours. Sepsis Labs: No results for input(s): "PROCALCITON", "LATICACIDVEN" in the last 168 hours.  Recent Results (from the past 240 hours)  Aerobic/Anaerobic Culture w Gram Stain (surgical/deep wound)     Status: None (Preliminary result)   Collection Time: 09/22/23 11:40 AM   Specimen: Abscess  Result Value Ref Range Status   Specimen Description ABSCESS  Final   Special Requests Normal  Final   Gram Stain FEW WBC SEEN MODERATE GRAM POSITIVE RODS   Final   Culture   Final    CULTURE REINCUBATED FOR BETTER GROWTH Performed at [facility], [address] [place], [place] [address]    Report Status PENDING  Incomplete         Radiology Studies: CT GUIDED PERITONEAL/RETROPERITONEAL FLUID DRAIN BY PERC CATH Result Date: 09/22/2023 CLINICAL DATA:  Gallbladder fossa abscess. Status post cholecystectomy on 09/05/2023. EXAM: CT GUIDED CATHETER DRAINAGE OF PERITONEAL ABSCESS ANESTHESIA/SEDATION: Moderate (conscious) sedation was employed during this procedure. A total of Versed  4.0 mg and Fentanyl  200 mcg was administered intravenously. Moderate Sedation Time: 30 minutes. The patient's level of consciousness and vital signs were monitored continuously by radiology nursing throughout the procedure under my direct supervision. PROCEDURE: The procedure, risks, benefits, and alternatives were explained to the patient. Questions regarding the procedure were encouraged and answered. The patient understands and consents to the procedure. A time out was performed prior to initiating the procedure. The abdominal wall was prepped with chlorhexidine  in a sterile fashion, and a sterile drape was applied covering the operative field. A sterile gown and sterile gloves were used for the procedure. Local anesthesia was  provided with 1% Lidocaine . CT was performed in a supine position. From a right anterolateral approach, an 18 gauge trocar needle was advanced into a gallbladder fossa abscess. After return of fluid, a guidewire was advanced into the collection, the tract dilated and a 10 French percutaneous drain advanced over the wire. The drain was formed and additional 20 mL fluid sample withdrawn for culture analysis. The drain was flushed with sterile saline and attached to a suction bulb. It was secured at the skin with a Prolene retention suture and adhesive StatLock device. RADIATION DOSE REDUCTION: This exam was performed according to the departmental dose-optimization program which includes automated exposure control, adjustment of the mA and/or kV according to patient size and/or use of iterative reconstruction technique. COMPLICATIONS: None FINDINGS: The gallbladder fossa abscess containing fluid and air yielded grossly purulent fluid. After drain placement there is good return of purulent fluid. A fluid sample was sent for culture analysis. There was a limited window for approach to the second probable complex fluid collection abutting the proximal duodenum. After drain placement and  partial decompression of the gallbladder fossa abscess, it appeared that the adjacent collection [place] be slightly smaller and therefore it was chosen to not perform a second aspiration/drainage procedure at this time and watch this collection. IMPRESSION: CT-guided percutaneous catheter drainage of gallbladder fossa abscess yielding purulent fluid. A 10 French drain was placed and attached to suction bulb drainage. A sample of purulent fluid was sent for culture analysis. A second adjacent fluid collection near the proximal duodenum appeared potentially slightly smaller after partial decompression of the gallbladder fossa abscess so a second aspiration drainage procedure was not performed at this time. Electronically Signed   By: Erica Hau M.D.   On: 09/22/2023 12:59   DG Abd Portable 1 View Result Date: 09/21/2023 CLINICAL DATA:  NG tube placement. EXAM: PORTABLE ABDOMEN - 1 VIEW COMPARISON:  None Available. FINDINGS: A nasogastric tube is seen with its distal tip overlying the expected region of the body of the stomach. Its distal side hole sits approximately 6.5 cm distal to the expected region of the gastroesophageal junction. The bowel gas pattern is normal. No radio-opaque calculi or other significant radiographic abnormality are seen. IMPRESSION: Nasogastric tube positioning, as described above. Electronically Signed   By: Virgle Grime M.D.   On: 09/21/2023 18:47   NM HEPATOBILIARY LEAK (POST-SURGICAL) Result Date: 09/21/2023 CLINICAL DATA:  Acute abdominal pain, rule out bile leak. Cholecystectomy 09/05/2023 EXAM: NUCLEAR MEDICINE HEPATOBILIARY IMAGING TECHNIQUE: Sequential images of the abdomen were obtained out to 60 minutes following intravenous administration of radiopharmaceutical. RADIOPHARMACEUTICALS:  Five mCi Tc-82m  Choletec IV COMPARISON:  CT abdomen 09/21/2023 FINDINGS: Satisfactory uptake of radiopharmaceutical from the blood pool. Biliary activity at 9 minutes and bowel activity at 11 minutes. We carried out imaging for 2 full hours. No abnormal extension of radiopharmaceutical to suggest bile leak. IMPRESSION: 1. No bile leak is demonstrated. We carried out observation for 2 full hours. 2. Patent common bile duct. Electronically Signed   By: Freida Jes M.D.   On: 09/21/2023 16:41        Scheduled Meds:  insulin  pump   Subcutaneous Q4H   methocarbamol  (ROBAXIN ) injection  1,000 mg Intravenous Q8H   sodium chloride  flush  3 mL Intravenous Q12H   sodium chloride  flush  5 mL Intracatheter Q8H   Continuous Infusions:  dextrose  5% lactated ringers  75 mL/hr at 09/23/23 0819   piperacillin-tazobactam (ZOSYN)  IV 3.375 g (09/23/23 0610)     LOS: 1 day    Time spent: 52 minutes spent on  09/23/2023 caring for this patient face-to-face including chart review, ordering labs/tests, documenting, discussion with nursing staff, consultants, updating family and interview/physical exam    Rema Care [place], DO [facility] Available via Epic secure chat 7am-7pm After these hours, please refer to coverage provider listed on [email or web address] 09/23/2023, 10:12 AM

## 2023-09-24 ENCOUNTER — Inpatient Hospital Stay (HOSPITAL_COMMUNITY)

## 2023-09-24 ENCOUNTER — Other Ambulatory Visit: Payer: Self-pay

## 2023-09-24 DIAGNOSIS — R188 Other ascites: Secondary | ICD-10-CM | POA: Diagnosis not present

## 2023-09-24 LAB — CBC
HCT: 32.5 % — ABNORMAL LOW (ref 39.0–52.0)
Hemoglobin: 10.8 g/dL — ABNORMAL LOW (ref 13.0–17.0)
MCH: 29.5 pg (ref 26.0–34.0)
MCHC: 33.2 g/dL (ref 30.0–36.0)
MCV: 88.8 fL (ref 80.0–100.0)
Platelets: 528 10*3/uL — ABNORMAL HIGH (ref 150–400)
RBC: 3.66 MIL/uL — ABNORMAL LOW (ref 4.22–5.81)
RDW: 12.2 % (ref 11.5–15.5)
WBC: 7.8 10*3/uL (ref 4.0–10.5)
nRBC: 0 % (ref 0.0–0.2)

## 2023-09-24 LAB — GLUCOSE, CAPILLARY
Glucose-Capillary: 228 mg/dL — ABNORMAL HIGH (ref 70–99)
Glucose-Capillary: 67 mg/dL — ABNORMAL LOW (ref 70–99)
Glucose-Capillary: 99 mg/dL (ref 70–99)
Glucose-Capillary: 116 mg/dL — ABNORMAL HIGH (ref 70–99)
Glucose-Capillary: 116 mg/dL — ABNORMAL HIGH (ref 70–99)

## 2023-09-24 LAB — BASIC METABOLIC PANEL WITH GFR
Anion gap: 8 (ref 5–15)
BUN: 11 mg/dL (ref 8–23)
CO2: 29 mmol/L (ref 22–32)
Calcium: 8.1 mg/dL — ABNORMAL LOW (ref 8.9–10.3)
Chloride: 97 mmol/L — ABNORMAL LOW (ref 98–111)
Creatinine, Ser: 1 mg/dL (ref 0.61–1.24)
GFR, Estimated: >60 mL/min (ref >60)
Glucose, Bld: 98 mg/dL (ref 70–99)
Potassium: 3.6 mmol/L (ref 3.5–5.1)
Sodium: 134 mmol/L — ABNORMAL LOW (ref 135–145)

## 2023-09-24 LAB — MAGNESIUM: Magnesium: 2.1 mg/dL (ref 1.7–2.4)

## 2023-09-24 MED ORDER — APIXABAN 5 MG PO TABS
5.0000 mg | ORAL_TABLET | Freq: Two times a day (BID) | ORAL | Status: DC
  Administered 2023-09-25: 5 mg via ORAL
  Filled 2023-09-24: qty 1

## 2023-09-24 MED ORDER — INSULIN ASPART 100 UNIT/ML IJ SOLN: 0.0000 [IU] | Freq: Three times a day (TID) | INTRAMUSCULAR | Status: DC

## 2023-09-24 MED ORDER — INSULIN PUMP
Freq: Three times a day (TID) | SUBCUTANEOUS | Status: DC
  Filled 2023-09-24: qty 1

## 2023-09-24 MED ORDER — TRAMADOL HCL 50 MG PO TABS: 50.0000 mg | ORAL_TABLET | Freq: Four times a day (QID) | ORAL | Status: DC | PRN

## 2023-09-24 MED ORDER — KETOROLAC TROMETHAMINE 15 MG/ML IJ SOLN
15.0000 mg | Freq: Four times a day (QID) | INTRAMUSCULAR | Status: DC
  Administered 2023-09-24 – 2023-09-25 (×4): 15 mg via INTRAVENOUS
  Filled 2023-09-24 (×4): qty 1

## 2023-09-24 MED ORDER — POTASSIUM CHLORIDE CRYS ER 20 MEQ PO TBCR
40.0000 meq | EXTENDED_RELEASE_TABLET | Freq: Once | ORAL | Status: AC
  Administered 2023-09-24: 40 meq via ORAL
  Filled 2023-09-24: qty 2

## 2023-09-24 MED ORDER — PANTOPRAZOLE SODIUM 40 MG PO TBEC
40.0000 mg | DELAYED_RELEASE_TABLET | Freq: Every day | ORAL | Status: DC
  Administered 2023-09-25: 40 mg via ORAL
  Filled 2023-09-24: qty 1

## 2023-09-24 NOTE — Progress Notes (Signed)
   09/24/23 1523  TOC Brief Assessment  Insurance and Status Reviewed  Patient has primary care physician Yes  Home environment has been reviewed home with wife  Prior level of function: independent has a cane  Prior/Current Home Services No current home services  Social Drivers of Health Review SDOH reviewed no interventions necessary  Readmission risk has been reviewed Yes  Transition of care needs no transition of care needs at this time    Patient from home with wife. Wife recently had an orthopedic surgery and has HHPT.   PCP Cleola Dach   Patient has drain. Prior to discharge bedside nurse will provide patient and support person drain care education. Patient states his daughter and/or daughter in law will assist with drain.      Transition of Care Department [facility]) has reviewed patient and no TOC needs have been identified at this time. We will continue to monitor patient advancement through interdisciplinary progression rounds. If new patient transition needs arise, please place a TOC consult.

## 2023-09-24 NOTE — Plan of Care (Signed)
  Problem: Education: Goal: Knowledge of General Education information will improve Description: Including pain rating scale, medication(s)/side effects and non-pharmacologic comfort measures Outcome: Progressing   Problem: Health Behavior/Discharge Planning: Goal: Ability to manage health-related needs will improve Outcome: Progressing   Problem: Clinical Measurements: Goal: Ability to maintain clinical measurements within normal limits will improve Outcome: Progressing Goal: Will remain free from infection Outcome: Progressing Goal: Diagnostic test results will improve Outcome: Progressing Goal: Respiratory complications will improve Outcome: Progressing Goal: Cardiovascular complication will be avoided Outcome: Progressing   Problem: Activity: Goal: Risk for activity intolerance will decrease Outcome: Progressing   Problem: Nutrition: Goal: Adequate nutrition will be maintained Outcome: Progressing   Problem: Elimination: Goal: Will not experience complications related to bowel motility Outcome: Progressing Goal: Will not experience complications related to urinary retention Outcome: Progressing   Problem: Pain Managment: Goal: General experience of comfort will improve and/or be controlled Outcome: Progressing   Problem: Safety: Goal: Ability to remain free from injury will improve Outcome: Progressing   Problem: Skin Integrity: Goal: Risk for impaired skin integrity will decrease Outcome: Progressing   Problem: Education: Goal: Ability to describe self-care measures that may prevent or decrease complications (Diabetes Survival Skills Education) will improve Outcome: Progressing Goal: Individualized Educational Video(s) Outcome: Progressing   Problem: Coping: Goal: Ability to adjust to condition or change in health will improve Outcome: Progressing   Problem: Fluid Volume: Goal: Ability to maintain a balanced intake and output will improve Outcome:  Progressing   Problem: Health Behavior/Discharge Planning: Goal: Ability to identify and utilize available resources and services will improve Outcome: Progressing Goal: Ability to manage health-related needs will improve Outcome: Progressing   Problem: Metabolic: Goal: Ability to maintain appropriate glucose levels will improve Outcome: Progressing   Problem: Nutritional: Goal: Maintenance of adequate nutrition will improve Outcome: Progressing Goal: Progress toward achieving an optimal weight will improve Outcome: Progressing   Problem: Skin Integrity: Goal: Risk for impaired skin integrity will decrease Outcome: Progressing   Problem: Tissue Perfusion: Goal: Adequacy of tissue perfusion will improve Outcome: Progressing

## 2023-09-24 NOTE — Progress Notes (Signed)
   09/24/23 0750  Vitals  Temp 98 F (36.7 C)  BP (!) 151/66  MAP (mmHg) 92  BP Location Left Arm  BP Method Automatic  Patient Position (if appropriate) Sitting  Pulse Rate (!) 52  Resp 17  Level of Consciousness  Level of Consciousness Alert  Oxygen Therapy  SpO2 96 %  O2 Device Room Air  Pain Assessment  Pain Scale 0-10  Pain Score 8  Pain Location Back

## 2023-09-24 NOTE — Progress Notes (Signed)
 Subjective: CC: Reports no abdominal pain. Tolerating cld without n/v. BM this am.   Reports R shoulder/neck pain. No cp or sob. Required IV pain medication this am and several doses yesterday. Discussed w/ TRH.   Afebrile. No tachycardia or hypotension. WBC wnl. No recent LFT's. Cx w/ enterobacter cloacae.    Objective: Vital signs in last 24 hours: Temp:  [97.6 F (36.4 C)-98.7 F (37.1 C)] 98 F (36.7 C) (05/12 0750) Pulse Rate:  [49-59] 52 (05/12 0750) Resp:  [14-18] 17 (05/12 0750) BP: (119-151)/(57-68) 151/66 (05/12 0750) SpO2:  [92 %-97 %] 96 % (05/12 0750) Last BM Date : 09/22/23  Intake/Output from previous day: 05/11 0701 - 05/12 0700 In: 1754.9 [P.O.:120; I.V.:1399.7; IV Piggyback:235.1] Out: 665 [Urine:600; Drains:65] Intake/Output this shift: No intake/output data recorded.  PE: Gen:  Alert, NAD, pleasant Abd: Soft, mild distension, NT, +BS, IR drain bloody purulent   Lab Results:  Recent Labs    09/23/23 0547 09/24/23 0504  WBC 9.9 7.8  HGB 11.4* 10.8*  HCT 33.5* 32.5*  PLT 596* 528*   BMET Recent Labs    09/23/23 0547 09/24/23 0504  NA 137 134*  K 3.5 3.6  CL 98 97*  CO2 29 29  GLUCOSE 67* 98  BUN 14 11  CREATININE 1.08 1.00  CALCIUM 8.6* 8.1*   PT/INR Recent Labs    09/22/23 0359  LABPROT 15.5*  INR 1.2   CMP     Component Value Date/Time   NA 134 (L) 09/24/2023 0504   NA 142 08/17/2023 1413   K 3.6 09/24/2023 0504   CL 97 (L) 09/24/2023 0504   CO2 29 09/24/2023 0504   GLUCOSE 98 09/24/2023 0504   BUN 11 09/24/2023 0504   BUN 17 08/17/2023 1413   CREATININE 1.00 09/24/2023 0504   CREATININE 1.20 10/03/2016 1711   CALCIUM 8.1 (L) 09/24/2023 0504   PROT 6.7 09/21/2023 0734   PROT 6.3 08/17/2023 1413   ALBUMIN 2.9 (L) 09/21/2023 0734   ALBUMIN 4.5 08/17/2023 1413   AST 34 09/21/2023 0734   ALT 20 09/21/2023 0734   ALKPHOS 99 09/21/2023 0734   BILITOT 1.0 09/21/2023 0734   BILITOT 1.6 (H) 08/17/2023 1413    GFRNONAA >60 09/24/2023 0504   GFRNONAA 61 10/03/2016 1711   GFRAA >60 01/26/2020 0903   GFRAA 71 10/03/2016 1711   Lipase     Component Value Date/Time   LIPASE 27 09/21/2023 0734    Studies/Results: CT GUIDED PERITONEAL/RETROPERITONEAL FLUID DRAIN BY PERC CATH Result Date: 09/22/2023 CLINICAL DATA:  Gallbladder fossa abscess. Status post cholecystectomy on 09/05/2023. EXAM: CT GUIDED CATHETER DRAINAGE OF PERITONEAL ABSCESS ANESTHESIA/SEDATION: Moderate (conscious) sedation was employed during this procedure. A total of Versed  4.0 mg and Fentanyl  200 mcg was administered intravenously. Moderate Sedation Time: 30 minutes. The patient's level of consciousness and vital signs were monitored continuously by radiology nursing throughout the procedure under my direct supervision. PROCEDURE: The procedure, risks, benefits, and alternatives were explained to the patient. Questions regarding the procedure were encouraged and answered. The patient understands and consents to the procedure. A time out was performed prior to initiating the procedure. The abdominal wall was prepped with chlorhexidine  in a sterile fashion, and a sterile drape was applied covering the operative field. A sterile gown and sterile gloves were used for the procedure. Local anesthesia was provided with 1% Lidocaine . CT was performed in a supine position. From a right anterolateral approach, an 18 gauge trocar needle  was advanced into a gallbladder fossa abscess. After return of fluid, a guidewire was advanced into the collection, the tract dilated and a 10 French percutaneous drain advanced over the wire. The drain was formed and additional 20 mL fluid sample withdrawn for culture analysis. The drain was flushed with sterile saline and attached to a suction bulb. It was secured at the skin with a Prolene retention suture and adhesive StatLock device. RADIATION DOSE REDUCTION: This exam was performed according to the departmental  dose-optimization program which includes automated exposure control, adjustment of the mA and/or kV according to patient size and/or use of iterative reconstruction technique. COMPLICATIONS: None FINDINGS: The gallbladder fossa abscess containing fluid and air yielded grossly purulent fluid. After drain placement there is good return of purulent fluid. A fluid sample was sent for culture analysis. There was a limited window for approach to the second probable complex fluid collection abutting the proximal duodenum. After drain placement and partial decompression of the gallbladder fossa abscess, it appeared that the adjacent collection Preziosi be slightly smaller and therefore it was chosen to not perform a second aspiration/drainage procedure at this time and watch this collection. IMPRESSION: CT-guided percutaneous catheter drainage of gallbladder fossa abscess yielding purulent fluid. A 10 French drain was placed and attached to suction bulb drainage. A sample of purulent fluid was sent for culture analysis. A second adjacent fluid collection near the proximal duodenum appeared potentially slightly smaller after partial decompression of the gallbladder fossa abscess so a second aspiration drainage procedure was not performed at this time. Electronically Signed   By: Erica Hau M.D.   On: 09/22/2023 12:59    Anti-infectives: Anti-infectives (From admission, onward)    Start     Dose/Rate Route Frequency Ordered Stop   09/21/23 1445  piperacillin-tazobactam (ZOSYN) IVPB 3.375 g        3.375 g 12.5 mL/hr over 240 Minutes Intravenous Every 8 hours 09/21/23 1437          Assessment/Plan POD 19 s/p Robotic chole Dr. [name] 4/23   Post operative intraabdominal fluid collections  7.3 x 5.9 cm fluid collection in GB fossa 6.3 x 3.9 cm fluid collection RUQ abscess causing duodenal/gastric outlet obstruction - HIDA negative for bile leak - IR drain placement x1 5/10 (there appeared to be connection  between abscesses). Drain per IR - Cont abx. Cx w/ enterobacter cloacae. Afebrile. WBC wnl. Okay to narrow abx from our standpoint - Tolerating cld without abdominal pain, n/v. Having bowel function. Adv diet - Will need f/u with Dr. [name] and IR at d/c.   FEN: FLD, IVF per primary VTE: Eliquis  - on hold. Ok for heparin  gtt or therapeutic lovenox  if needed. Can restart Eliquis  when tolerating diet advancement ID: Zosyn   -per TRH- DM - insulin  pump A fib on Eliquis  GERD HTN HLD RA  I reviewed nursing notes, hospitalist notes, last 24 h vitals and pain scores, last 48 h intake and output, last 24 h labs and trends, and last 24 h imaging results.   LOS: 2 days    Delton Filbert, [facility] Surgery 09/24/2023, 9:26 AM Please see Amion for pager number during day hours 7:00am-4:30pm

## 2023-09-24 NOTE — Progress Notes (Signed)
 PROGRESS NOTE    Joel Nhan Jaye Sr.  GNF:[id number] DOB: 25-May-1945 DOA: 09/21/2023 PCP: [name], MD    Brief Narrative:   Joel Patches Endo Sr. is a 78 y.o. male with past medical history significant for HTN, HLD, DM2, GERD, paroxysmal atrial fibrillation, CAD, RA, obesity who presented to [facility] ED on 09/21/2023 with abdominal pain associate with nausea and vomiting.  Onset over the last day associated with weakness and fatigue.  Recently had robotic cholecystectomy on 09/05/2023.  Denies fever, no chills, no chest pain, no shortness of breath.  Most recently seen in the ED at [facility] on 4/27 due to abdominal pain with CT showing fluid collection.  General surgery offered admission and HIDA scan but patient declined and left AMA.  In the ED, temperature 97.8 F, HR 55, RR 18, BP 132/70, SpO2 97% on room air.  WBC 10.6, hemoglobin 12.2, platelet count 626.  Sodium 137, potassium 4.3, chloride 94, CO2 28, glucose 102, BUN 20, creat 1.23.  Lipase 27, AST 34, ALT 20, total bilirubin 1.0.  On urinalysis unrevealing.  CT abdomen/pelvis with contrast with noted s/p cholecystectomy with 7.3 x 5.9 cm fluid collection with air-fluid levels gallbladder fossa which is enlarged prior to exam on 4/27 consistent with abscess or biloma, also 6.3 x 3.9 cm fluid-filled structure medial to this concerning for abscess or severely inflamed gastric antrum and proximal duodenum with mild/moderate gastric distention suggestive of gastric outlet obstruction.  General surgery was consulted.  TRH consulted for admission for further evaluation management of intra-abdominal abscess causing gastric outlet obstruction  Assessment & Plan:   RUQ Intra-abdominal abscess causing duodenal/gastric outlet obstruction Patient presenting to ED with progressive abdominal pain associate with nausea and vomiting.  Patient was afebrile with elevated WBC count of 10.6. CT abdomen/pelvis with contrast with noted s/p cholecystectomy with 7.3 x  5.9 cm fluid collection with air-fluid levels gallbladder fossa which is enlarged prior to exam on 4/27 consistent with abscess or biloma, also 6.3 x 3.9 cm fluid-filled structure medial to this concerning for abscess or severely inflamed gastric antrum and proximal duodenum with mild/moderate gastric distention suggestive of gastric outlet obstruction.  Recent robotic cholecystectomy by Dr. [name] on 09/05/2022.  Nuclear medicine hepatobiliary leak scan with no bile leak demonstrated, patent common bile duct.  Interventional radiology was consulted and patient underwent drain placement on 09/22/2023 -- General Surgery following, appreciate assistance -- WBC 10.6>9.4>9.9 -- Abdominal drain with 65 mL out past 24 hours -- Drain culture: + Enterobacter cloacae, Enterococcus faecium; susceptibilities pending -- Zosyn 3.375g IV q every 8 hours -- Advance to full liquid diet, further advancement per general surgery  Right neck, shoulder pain Unclear etiology, denies trauma.  Potential muscle strain. -- Check chest x-ray, x-ray C-spine/right shoulder -- Toradol  15 mg IV every 6 hours x 3 days -- Robaxin  1000 mg IV every 8 hours -- Tramadol  50 mg p.o. every 6 hours as needed moderate pain -- Dilaudid  0.5-1 mg IV every 3 hours as needed severe pain not relieved with oral medications or IV Toradol   Essential hypertension At baseline on amlodipine  5 mg p.o. daily, ramipril  5 mg p.o. daily. -- BP 119/57, continue to hold oral antihypertensives at this time -- Hydralazine 10 mg IV q6h PRN SBP >165  Hyperlipidemia Currently not on statin outpatient  Hypoglycemia DM2 On insulin  pump at baseline.  Patient with hypoglycemic episode this morning, likely secondary to prolonged n.p.o. status.  Received D50 IV on 5/11 -- Advance to  full liquid diet -- CBGs every 4 hours while NPO  GERD -- Protonix  40 mg p.o. daily  Persistent atrial fibrillation Hx sinus bradycardia Patient underwent A-fib ablation on  05/23/2023.  Not on rate controlling medication due to bradycardia -- Restart Eliquis  tomorrow if tolerating diet  Rheumatoid arthritis Follows with rheumatology outpatient, Dr. [name]. On Abatacept  outpatient; on hold currently after surgery.  Obesity Complicates all facets of care   DVT prophylaxis: SCDs Start: 09/21/23 1700    Code Status: Full Code Family Communication: Updated patient's son-in-law present at bedside this morning  Disposition Plan:  Level of care: Med-Surg Status is: Inpatient Remains inpatient appropriate because: IV antibiotics, continues to be n.p.o. with NG tube in place, awaiting further recommendations from general surgery; will need placement on diet with advancement with toleration before stable for discharge home     Consultants:  General Surgery Interventional radiology  Procedures:  Nuclear medicine hepatobiliary scan IR intra-abdominal drain placement, 5/10  Antimicrobials:  Zosyn 5/9>>   Subjective: Patient seen examined bedside, lying in bed.  Patient complaining of right-sided neck pain, right shoulder pain.  Denies trauma.  Localized to the right paraspinal cervical region.  Seen by general surgery this morning, NG tube removed yesterday, now advanced to full liquid diet today.  Continues with good drain output. Denies headache, no dizziness, no chest pain, no palpitations, no shortness of breath, no fever/chills/night sweats, no nausea/vomiting/diarrhea, no focal weakness, no cough/congestion, no paresthesias.  No acute events reported overnight by nursing staff.  Objective: Vitals:   09/24/23 0442 09/24/23 0708 09/24/23 0733 09/24/23 0750  BP: (!) 119/57 (!) 143/68  (!) 151/66  Pulse: (!) 49 (!) 57  (!) 52  Resp: 16 14  17   Temp: 98.3 F (36.8 C)   98 F (36.7 C)  TempSrc: Oral     SpO2: 92% 96%  96%  Height:   5\' 7"  (1.702 m)     Intake/Output Summary (Last 24 hours) at 09/24/2023 1119 Last data filed at 09/24/2023 0900 Gross  per 24 hour  Intake 2374.85 ml  Output 665 ml  Net 1709.85 ml   There were no vitals filed for this visit.  Examination:  Physical Exam: GEN: NAD, alert and oriented x 3, obese HEENT: NCAT, PERRL, EOMI, sclera clear, MMM PULM: CTAB w/o wheezes/crackles, normal respiratory effort, on room air CV: RRR w/o M/G/R GI: abd soft, NTND, + BS; abdominal drain noted MSK: no peripheral edema, muscle strength globally intact 5/5 bilateral upper/lower extremities NEURO: CN II-XII intact, no focal deficits, sensation to light touch intact PSYCH: normal mood/affect Integumentary: dry/intact, no rashes or wounds    Data Reviewed: I have personally reviewed following labs and imaging studies  CBC: Recent Labs  Lab 09/21/23 0734 09/21/23 0739 09/22/23 0359 09/23/23 0547 09/24/23 0504  WBC 10.6*  --  9.4 9.9 7.8  NEUTROABS 7.8*  --   --   --   --   HGB 12.2* 13.3 11.7* 11.4* 10.8*  HCT 36.9* 39.0 35.1* 33.5* 32.5*  MCV 91.1  --  90.5 89.1 88.8  PLT 626*  --  584* 596* 528*   Basic Metabolic Panel: Recent Labs  Lab 09/21/23 0734 09/21/23 0739 09/22/23 0359 09/23/23 0547 09/24/23 0504  NA 137 135 135 137 134*  K 4.3 4.2 3.7 3.5 3.6  CL 94* 95* 97* 98 97*  CO2 28  --  27 29 29   GLUCOSE 102* 131* 92 67* 98  BUN 20 21 18 14 11   CREATININE  1.23 1.10 1.18 1.08 1.00  CALCIUM 9.0  --  8.3* 8.6* 8.1*  MG  --   --   --   --  2.1   GFR: CrCl cannot be calculated (Unknown ideal weight.). Liver Function Tests: Recent Labs  Lab 09/21/23 0734  AST 34  ALT 20  ALKPHOS 99  BILITOT 1.0  PROT 6.7  ALBUMIN 2.9*   Recent Labs  Lab 09/21/23 0734  LIPASE 27   No results for input(s): "AMMONIA" in the last 168 hours. Coagulation Profile: Recent Labs  Lab 09/22/23 0359  INR 1.2   Cardiac Enzymes: No results for input(s): "CKTOTAL", "CKMB", "CKMBINDEX", "TROPONINI" in the last 168 hours. BNP (last 3 results) No results for input(s): "PROBNP" in the last 8760 hours. HbA1C: No  results for input(s): "HGBA1C" in the last 72 hours. CBG: Recent Labs  Lab 09/23/23 1143 09/23/23 1627 09/23/23 2013 09/24/23 0020 09/24/23 0747  GLUCAP 197* 254* 400* 228* 99   Lipid Profile: No results for input(s): "CHOL", "HDL", "LDLCALC", "TRIG", "CHOLHDL", "LDLDIRECT" in the last 72 hours. Thyroid  Function Tests: No results for input(s): "TSH", "T4TOTAL", "FREET4", "T3FREE", "THYROIDAB" in the last 72 hours. Anemia Panel: No results for input(s): "VITAMINB12", "FOLATE", "FERRITIN", "TIBC", "IRON", "RETICCTPCT" in the last 72 hours. Sepsis Labs: No results for input(s): "PROCALCITON", "LATICACIDVEN" in the last 168 hours.  Recent Results (from the past 240 hours)  Aerobic/Anaerobic Culture w Gram Stain (surgical/deep wound)     Status: None (Preliminary result)   Collection Time: 09/22/23 11:40 AM   Specimen: Abscess  Result Value Ref Range Status   Specimen Description ABSCESS  Final   Special Requests Normal  Final   Gram Stain   Final    FEW WBC SEEN MODERATE GRAM POSITIVE RODS Performed at [facility], [address], [place], [place] [address]    Culture   Final    RARE ENTEROBACTER CLOACAE RARE ENTEROCOCCUS FAECIUM CONFIRMATION OF SUSCEPTIBILITIES IN PROGRESS NO ANAEROBES ISOLATED; CULTURE IN PROGRESS FOR 5 DAYS    Report Status PENDING  Incomplete   Organism ID, Bacteria ENTEROBACTER CLOACAE  Final      Susceptibility   Enterobacter cloacae - MIC*    CEFEPIME <=0.12 SENSITIVE Sensitive     CEFTAZIDIME <=1 SENSITIVE Sensitive     CIPROFLOXACIN <=0.25 SENSITIVE Sensitive     GENTAMICIN <=1 SENSITIVE Sensitive     IMIPENEM 0.5 SENSITIVE Sensitive     TRIMETH /SULFA  <=20 SENSITIVE Sensitive     PIP/TAZO <=4 SENSITIVE Sensitive ug/mL    * RARE ENTEROBACTER CLOACAE         Radiology Studies: CT GUIDED PERITONEAL/RETROPERITONEAL FLUID DRAIN BY PERC CATH Result Date: 09/22/2023 CLINICAL DATA:  Gallbladder fossa abscess. Status post cholecystectomy  on 09/05/2023. EXAM: CT GUIDED CATHETER DRAINAGE OF PERITONEAL ABSCESS ANESTHESIA/SEDATION: Moderate (conscious) sedation was employed during this procedure. A total of Versed  4.0 mg and Fentanyl  200 mcg was administered intravenously. Moderate Sedation Time: 30 minutes. The patient's level of consciousness and vital signs were monitored continuously by radiology nursing throughout the procedure under my direct supervision. PROCEDURE: The procedure, risks, benefits, and alternatives were explained to the patient. Questions regarding the procedure were encouraged and answered. The patient understands and consents to the procedure. A time out was performed prior to initiating the procedure. The abdominal wall was prepped with chlorhexidine  in a sterile fashion, and a sterile drape was applied covering the operative field. A sterile gown and sterile gloves were used for the procedure. Local anesthesia was  provided with 1% Lidocaine . CT was performed in a supine position. From a right anterolateral approach, an 18 gauge trocar needle was advanced into a gallbladder fossa abscess. After return of fluid, a guidewire was advanced into the collection, the tract dilated and a 10 French percutaneous drain advanced over the wire. The drain was formed and additional 20 mL fluid sample withdrawn for culture analysis. The drain was flushed with sterile saline and attached to a suction bulb. It was secured at the skin with a Prolene retention suture and adhesive StatLock device. RADIATION DOSE REDUCTION: This exam was performed according to the departmental dose-optimization program which includes automated exposure control, adjustment of the mA and/or kV according to patient size and/or use of iterative reconstruction technique. COMPLICATIONS: None FINDINGS: The gallbladder fossa abscess containing fluid and air yielded grossly purulent fluid. After drain placement there is good return of purulent fluid. A fluid sample was sent  for culture analysis. There was a limited window for approach to the second probable complex fluid collection abutting the proximal duodenum. After drain placement and partial decompression of the gallbladder fossa abscess, it appeared that the adjacent collection Wolters be slightly smaller and therefore it was chosen to not perform a second aspiration/drainage procedure at this time and watch this collection. IMPRESSION: CT-guided percutaneous catheter drainage of gallbladder fossa abscess yielding purulent fluid. A 10 French drain was placed and attached to suction bulb drainage. A sample of purulent fluid was sent for culture analysis. A second adjacent fluid collection near the proximal duodenum appeared potentially slightly smaller after partial decompression of the gallbladder fossa abscess so a second aspiration drainage procedure was not performed at this time. Electronically Signed   By: Erica Hau M.D.   On: 09/22/2023 12:59        Scheduled Meds:  ketorolac   15 mg Intravenous Q6H   methocarbamol  (ROBAXIN ) injection  1,000 mg Intravenous Q8H   sodium chloride  flush  3 mL Intravenous Q12H   sodium chloride  flush  5 mL Intracatheter Q8H   Continuous Infusions:  piperacillin-tazobactam (ZOSYN)  IV 3.375 g (09/24/23 0608)     LOS: 2 days    Time spent: 52 minutes spent on 09/24/2023 caring for this patient face-to-face including chart review, ordering labs/tests, documenting, discussion with nursing staff, consultants, updating family and interview/physical exam    Rema Care [place], DO [facility] Available via Epic secure chat 7am-7pm After these hours, please refer to coverage provider listed on [email or web address] 09/24/2023, 11:19 AM

## 2023-09-24 NOTE — Progress Notes (Signed)
 Mobility Specialist Progress Note:    09/24/23 1156  Mobility  Activity Ambulated with assistance in hallway  Level of Assistance Contact guard assist, steadying assist  Assistive Device Front wheel walker  Distance Ambulated (ft) 400 ft  Activity Response Tolerated well  Mobility Referral Yes  Mobility visit 1 Mobility  Mobility Specialist Start Time (ACUTE ONLY) 0920  Mobility Specialist Stop Time (ACUTE ONLY) 0935  Mobility Specialist Time Calculation (min) (ACUTE ONLY) 15 min   Pt received in bed agreeable to mobility. Requested to use the BR first, void complete. Was able to ambulate to the [facility] w/ HHA, requested to use RW in the halls. No c/o throughout. Returned to room w/o fault. Left in bed w/ call bell and personal belongings in reach. All needs met. Bed alarm on.  Inetta Manes Mobility Specialist  Please contact vis Secure Chat or  Rehab Office [phone]

## 2023-09-24 NOTE — Progress Notes (Signed)
 Pt requesting heating pad. 2 provided to pt

## 2023-09-24 NOTE — Progress Notes (Signed)
Pt transported off unit to xray.

## 2023-09-24 NOTE — Progress Notes (Signed)
 Chief Complaint: Patient was seen today for Biloma   Referring Physician(s): Marlin Simmonds, [place]  Supervising Physician: Alyssa Jumper  Patient Status: [facility] - In-pt  Subjective: 5/12: patient is 2 days post drain placement. Patient reports pain this morning that radiated to the shoulder. This has improved since onset. He is alert and without drain concerns.   Objective: Physical Exam: BP (!) 151/66 (BP Location: Left Arm)   Pulse (!) 52   Temp 98 F (36.7 C)   Resp 17   Ht 5\' 7"  (1.702 m)   SpO2 96%   BMI 30.54 kg/m  Constitutional: adult male, alert, sitting upright in bed. INAD.  Skin: warm and dry. RUQ drain in place without surrounding erythema or edema. Suture/stat lock in place. No TTP. Bilious output present in bulb.  Chest: symmetric rise and fall of chest. Pulm: breathing comfortably on room air.  Neuro: alert and oriented.  Psych: normal insight and judgement. Non pressured speech.   Current Facility-Administered Medications:    [START ON 09/25/2023] apixaban  (ELIQUIS ) tablet 5 mg, 5 mg, Oral, BID, [place], Eric J, DO   hydrALAZINE (APRESOLINE) injection 10 mg, 10 mg, Intravenous, Q6H PRN, [place], Eric J, DO   HYDROmorphone  (DILAUDID ) injection 0.5-1 mg, 0.5-1 mg, Intravenous, Q3H PRN, Melvin, Alexander B, MD, 1 mg at 09/24/23 1027   insulin  aspart (novoLOG ) injection 0-6 Units, 0-6 Units, Subcutaneous, TID WC, [place], Eric J, DO   ketorolac  (TORADOL ) 15 MG/ML injection 15 mg, 15 mg, Intravenous, Q6H, [place], Rema Care, DO, 15 mg at 09/24/23 2536   methocarbamol  (ROBAXIN ) injection 1,000 mg, 1,000 mg, Intravenous, Q8H, Elwin Hammond, PA-C, 1,000 mg at 09/24/23 0605   [START ON 09/25/2023] pantoprazole  (PROTONIX ) EC tablet 40 mg, 40 mg, Oral, Daily, [place], Eric J, DO   phenol (CHLORASEPTIC) mouth spray 1 spray, 1 spray, Mouth/Throat, PRN, Melvin, Alexander B, MD   piperacillin-tazobactam (ZOSYN) IVPB 3.375 g, 3.375 g, Intravenous, Q8H, Marlin Simmonds, PA-C,  Last Rate: 12.5 mL/hr at 09/24/23 0608, 3.375 g at 09/24/23 0608   sodium chloride  flush (NS) 0.9 % injection 3 mL, 3 mL, Intravenous, Q12H, Johnetta Nab, MD, 3 mL at 09/24/23 0830   sodium chloride  flush (NS) 0.9 % injection 5 mL, 5 mL, Intracatheter, Q8H, Erica Hau, MD, 5 mL at 09/24/23 0605   traMADol  (ULTRAM ) tablet 50 mg, 50 mg, Oral, Q6H PRN, [place], Eric J, DO  Labs: CBC Recent Labs    09/23/23 0547 09/24/23 0504  WBC 9.9 7.8  HGB 11.4* 10.8*  HCT 33.5* 32.5*  PLT 596* 528*   BMET Recent Labs    09/23/23 0547 09/24/23 0504  NA 137 134*  K 3.5 3.6  CL 98 97*  CO2 29 29  GLUCOSE 67* 98  BUN 14 11  CREATININE 1.08 1.00  CALCIUM 8.6* 8.1*   LFT No results for input(s): "PROT", "ALBUMIN", "AST", "ALT", "ALKPHOS", "BILITOT", "BILIDIR", "IBILI", "LIPASE" in the last 72 hours. PT/INR Recent Labs    09/22/23 0359  LABPROT 15.5*  INR 1.2     Studies/Results: No results found.  Assessment/Plan: Biloma   Drain Location: RUQ Size: Fr size: 10 Fr Date of placement: 09/22/23 Currently to: Drain collection device: suction bulb 24 hour output:  Output by Drain (mL) 09/22/23 0700 - 09/22/23 1459 09/22/23 1500 - 09/22/23 2259 09/22/23 2300 - 09/23/23 0659 09/23/23 0700 - 09/23/23 1459 09/23/23 1500 - 09/23/23 2259 09/23/23 2300 - 09/24/23 0659 09/24/23 0700 - 09/24/23 1241  Closed System Drain 1 Right  RUQ Bulb (JP) 10 Fr. 75 30  25 40  50    Interval imaging/drain manipulation:  TBD  Current examination: Patient afebrile. WBC and HGB stable.  40 cc output in the last 24 hours.  Flushes/aspirates easily.  Insertion site unremarkable. Suture and stat lock in place. Dressed appropriately.   Plan: Continue TID flushes with 5 cc NS. Record output Q shift. Dressing changes QD or PRN if soiled.  Call IR APP or on call IR MD if difficulty flushing or sudden change in drain output.  Repeat imaging/possible drain injection once output < 10 mL/QD  (excluding flush material). Consideration for drain removal if output is < 10 mL/QD (excluding flush material), pending discussion with the providing surgical service.  Discharge planning: Please contact IR APP or on call IR MD prior to patient d/c to ensure appropriate follow up plans are in place. Typically patient will follow up with IR clinic 10-14 days post d/c for repeat imaging/possible drain injection. IR scheduler will contact patient with date/time of appointment. Patient will need to flush drain QD with 5 cc NS, record output QD, dressing changes every 2-3 days or earlier if soiled.   IR will continue to follow - please call with questions or concerns.       LOS: 2 days   I spent a total of 15 minutes in face to face in clinical consultation, greater than 50% of which was counseling/coordinating care for Biloma   Neomia Banner Tin Engram PA-C 09/24/2023 12:41 PM

## 2023-09-24 NOTE — Progress Notes (Signed)
Heart healthy dinner tray ordered 

## 2023-09-24 NOTE — Progress Notes (Signed)
 Pt complaining of back pain this morning. States that it is hurting more than normal. Dilaudid  IV pain med given to pt. Pt also made aware that this PRN pain med can be given every 3 hours and can be given again at 1039 if needed. Due time also written on pt board in room so he can see time if he forgets. Bed in lowest position. Call light in reach. Bed alarm on. All needs met at this time. MD made of aware of pain.

## 2023-09-24 NOTE — Inpatient Diabetes Management (Signed)
 Inpatient Diabetes Program Recommendations  AACE/ADA: New Consensus Statement on Inpatient Glycemic Control (2015)  Target Ranges:  Prepandial:   less than 140 mg/dL      Peak postprandial:   less than 180 mg/dL (1-2 hours)      Critically ill patients:  140 - 180 mg/dL   Lab Results  Component Value Date   GLUCAP 116 (H) 09/24/2023   HGBA1C 8.3 05/07/2023    Review of Glycemic Control  Latest Reference Range & Units 09/23/23 20:13 09/24/23 00:20 09/24/23 07:47 09/24/23 11:50  Glucose-Capillary 70 - 99 mg/dL 409 (H) 811 (H) 99 914 (H)  (H): Data is abnormally high  Diabetes history: DM2 Outpatient Diabetes medications:  Medtronic Insulin  Pump with Fiasp  Basal-1.20/hr--28.8 units/day Carb ratio 00:00-07:00-15 07:00-11:30-10 11:30-00:00-15 Target BG 90-110 Insulin  sensitivity factor--1 units brings him down 50 mg/dL Current orders for Inpatient glycemic control: Novolog  0-6 units TID  Inpatient Diabetes Program Recommendations:    Please discontinue Novolog  0-6 units TID Please order Insulin  Pump Therapy   Met with patient and son at bedside.  He has type 2 DM; his endocrinologist is Dr. Kathyanne Parkers.  Last visit was 07/31/2023.  Reviewed insulin  pump settings.  He does not use a CGM; he likes to use finger sticks.  VO received to DC Novolog  0-6 units TID and order insulin  pump therapy.  Patient last A1C was 7.6% on 08/10/23.  Will continue to follow while inpatient.  Thank you, Hays Lipschutz, MSN, CDCES Diabetes Coordinator Inpatient Diabetes Program [phone] (team pager from 8a-5p)

## 2023-09-25 ENCOUNTER — Other Ambulatory Visit: Payer: Self-pay | Admitting: General Surgery

## 2023-09-25 DIAGNOSIS — R188 Other ascites: Secondary | ICD-10-CM

## 2023-09-25 LAB — BASIC METABOLIC PANEL WITH GFR
Anion gap: 6 (ref 5–15)
BUN: 12 mg/dL (ref 8–23)
CO2: 29 mmol/L (ref 22–32)
Calcium: 8.2 mg/dL — ABNORMAL LOW (ref 8.9–10.3)
Chloride: 101 mmol/L (ref 98–111)
Creatinine, Ser: 1.05 mg/dL (ref 0.61–1.24)
GFR, Estimated: >60 mL/min (ref >60)
Glucose, Bld: 93 mg/dL (ref 70–99)
Potassium: 3.1 mmol/L — ABNORMAL LOW (ref 3.5–5.1)
Sodium: 136 mmol/L (ref 135–145)

## 2023-09-25 LAB — GLUCOSE, CAPILLARY: Glucose-Capillary: 79 mg/dL (ref 70–99)

## 2023-09-25 LAB — MAGNESIUM: Magnesium: 2 mg/dL (ref 1.7–2.4)

## 2023-09-25 MED ORDER — POTASSIUM CHLORIDE 20 MEQ PO PACK
60.0000 meq | PACK | Freq: Once | ORAL | Status: AC
  Administered 2023-09-25: 60 meq via ORAL
  Filled 2023-09-25: qty 3

## 2023-09-25 MED ORDER — METHOCARBAMOL 500 MG PO TABS: 500.0000 mg | ORAL_TABLET | Freq: Four times a day (QID) | ORAL | 0 refills | Status: DC | PRN

## 2023-09-25 MED ORDER — AMOXICILLIN 500 MG PO TABS
1000.0000 mg | ORAL_TABLET | Freq: Three times a day (TID) | ORAL | 0 refills | Status: AC
Start: 2023-09-25 — End: 2023-10-01

## 2023-09-25 MED ORDER — TRAMADOL HCL 50 MG PO TABS: 50.0000 mg | ORAL_TABLET | Freq: Four times a day (QID) | ORAL | 0 refills | Status: DC | PRN

## 2023-09-25 MED ORDER — LEVOFLOXACIN 750 MG PO TABS: 750.0000 mg | ORAL_TABLET | Freq: Every day | ORAL | 0 refills | Status: AC

## 2023-09-25 MED ORDER — AMOXICILLIN-POT CLAVULANATE 875-125 MG PO TABS: 1.0000 | ORAL_TABLET | Freq: Two times a day (BID) | ORAL | 0 refills | Status: DC

## 2023-09-25 NOTE — Discharge Instructions (Signed)
 Flush abdominal drain with 5 mL normal saline daily and monitor amount of output daily.  Change dressing surrounding abdominal drain site every 2-3 days or if becomes soiled.  Outpatient follow-up with [facility] radiology clinic for determination of intra-abdominal drain removal.  Need close follow-up with general surgery, Dr. [name] in 1 week.

## 2023-09-25 NOTE — Plan of Care (Signed)
  Problem: Education: Goal: Knowledge of General Education information will improve Description: Including pain rating scale, medication(s)/side effects and non-pharmacologic comfort measures Outcome: Adequate for Discharge   Problem: Health Behavior/Discharge Planning: Goal: Ability to manage health-related needs will improve Outcome: Adequate for Discharge   Problem: Clinical Measurements: Goal: Ability to maintain clinical measurements within normal limits will improve Outcome: Adequate for Discharge Goal: Will remain free from infection Outcome: Adequate for Discharge Goal: Diagnostic test results will improve Outcome: Adequate for Discharge Goal: Respiratory complications will improve Outcome: Adequate for Discharge Goal: Cardiovascular complication will be avoided Outcome: Adequate for Discharge   Problem: Activity: Goal: Risk for activity intolerance will decrease Outcome: Adequate for Discharge   Problem: Nutrition: Goal: Adequate nutrition will be maintained Outcome: Adequate for Discharge   Problem: Elimination: Goal: Will not experience complications related to bowel motility Outcome: Adequate for Discharge Goal: Will not experience complications related to urinary retention Outcome: Adequate for Discharge   Problem: Pain Managment: Goal: General experience of comfort will improve and/or be controlled Outcome: Adequate for Discharge   Problem: Safety: Goal: Ability to remain free from injury will improve Outcome: Adequate for Discharge   Problem: Skin Integrity: Goal: Risk for impaired skin integrity will decrease Outcome: Adequate for Discharge   Problem: Education: Goal: Ability to describe self-care measures that [place] prevent or decrease complications (Diabetes Survival Skills Education) will improve Outcome: Adequate for Discharge Goal: Individualized Educational Video(s) Outcome: Adequate for Discharge   Problem: Coping: Goal: Ability to adjust to  condition or change in health will improve Outcome: Adequate for Discharge   Problem: Fluid Volume: Goal: Ability to maintain a balanced intake and output will improve Outcome: Adequate for Discharge   Problem: Health Behavior/Discharge Planning: Goal: Ability to identify and utilize available resources and services will improve Outcome: Adequate for Discharge Goal: Ability to manage health-related needs will improve Outcome: Adequate for Discharge   Problem: Metabolic: Goal: Ability to maintain appropriate glucose levels will improve Outcome: Adequate for Discharge   Problem: Nutritional: Goal: Maintenance of adequate nutrition will improve Outcome: Adequate for Discharge Goal: Progress toward achieving an optimal weight will improve Outcome: Adequate for Discharge   Problem: Skin Integrity: Goal: Risk for impaired skin integrity will decrease Outcome: Adequate for Discharge   Problem: Tissue Perfusion: Goal: Adequacy of tissue perfusion will improve Outcome: Adequate for Discharge

## 2023-09-25 NOTE — Progress Notes (Signed)
 DISCHARGE NOTE HOME Joel Garr Stankovic Sr. to be discharged Home per MD order. Discussed prescriptions and follow up appointments with the patient. Prescriptions given to patient; medication list explained in detail. Patient verbalized understanding.  Skin clean, dry and intact without evidence of skin break down, no evidence of skin tears noted. IV catheter discontinued intact. Site without signs and symptoms of complications. Dressing and pressure applied. Pt denies pain at the site currently. No complaints noted.  SeeLDA for incisions and Drain discharging with.  An After Visit Summary (AVS) was printed and given to the patient. Patient escorted via wheelchair, and discharged home via private auto.  Tonda Francisco, RN

## 2023-09-25 NOTE — Discharge Summary (Addendum)
 Physician Discharge Summary  Joel Dubuque Burgio Sr. BJY:[id number] DOB: 1946/04/12 DOA: 09/21/2023  PCP: [name], MD  Admit date: 09/21/2023 Discharge date: 09/25/2023  Admitted From: Home Disposition: Home  Recommendations for Outpatient Follow-up:  Follow up with PCP in 1-2 weeks Follow-up with general surgery, Dr. [name] 1 week Follow-up with interventional radiology drain clinic for management of intra-abdominal drain Levaquin/amoxicillin  to complete 10-day course for intra-abdominal abscess following recent cholecystectomy  Home Health: No Equipment/Devices: None  Discharge Condition: Stable CODE STATUS: Full code Diet recommendation: Heart healthy/consistent carbohydrate diet  History of present illness:  Joel Duffer Cremer Sr. is a 78 y.o. male with past medical history significant for HTN, HLD, DM2, GERD, paroxysmal atrial fibrillation, CAD, RA, obesity who presented to [facility] ED on 09/21/2023 with abdominal pain associate with nausea and vomiting.  Onset over the last day associated with weakness and fatigue.  Recently had robotic cholecystectomy on 09/05/2023.  Denies fever, no chills, no chest pain, no shortness of breath.   Most recently seen in the ED at [facility] on 4/27 due to abdominal pain with CT showing fluid collection.  General surgery offered admission and HIDA scan but patient declined and left AMA.   In the ED, temperature 97.8 F, HR 55, RR 18, BP 132/70, SpO2 97% on room air.  WBC 10.6, hemoglobin 12.2, platelet count 626.  Sodium 137, potassium 4.3, chloride 94, CO2 28, glucose 102, BUN 20, creat 1.23.  Lipase 27, AST 34, ALT 20, total bilirubin 1.0.  On urinalysis unrevealing.  CT abdomen/pelvis with contrast with noted s/p cholecystectomy with 7.3 x 5.9 cm fluid collection with air-fluid levels gallbladder fossa which is enlarged prior to exam on 4/27 consistent with abscess or biloma, also 6.3 x 3.9 cm fluid-filled structure medial to this concerning for abscess or  severely inflamed gastric antrum and proximal duodenum with mild/moderate gastric distention suggestive of gastric outlet obstruction.  General surgery was consulted.  TRH consulted for admission for further evaluation management of intra-abdominal abscess causing gastric outlet obstruction  Hospital course:  RUQ Intra-abdominal abscess causing duodenal/gastric outlet obstruction Patient presenting to ED with progressive abdominal pain associate with nausea and vomiting.  Patient was afebrile with elevated WBC count of 10.6. CT abdomen/pelvis with contrast with noted s/p cholecystectomy with 7.3 x 5.9 cm fluid collection with air-fluid levels gallbladder fossa which is enlarged prior to exam on 4/27 consistent with abscess or biloma, also 6.3 x 3.9 cm fluid-filled structure medial to this concerning for abscess or severely inflamed gastric antrum and proximal duodenum with mild/moderate gastric distention suggestive of gastric outlet obstruction.  General surgery was consulted and followed during hospital course.  Recent robotic cholecystectomy by Dr. [name] on 09/05/2022.  Nuclear medicine hepatobiliary leak scan with no bile leak demonstrated, patent common bile duct.  Interventional radiology was consulted and patient underwent drain placement on 09/22/2023. Drain culture positive for Enterobacter cloacae, Enterococcus faecium.  Patient was treated with IV Zosyn during hospitalization with improvement of symptoms.  Diet was slowly advanced with toleration.  Will discharge on Levaquin and amoxicillin  in accordance with culture susceptibilities to complete 10-day antibiotic course.  Outpatient follow-up with general surgery and interventional radiology.   Right neck, shoulder pain Unclear etiology, denies trauma.  Potential muscle strain.  Chest x-ray, x-ray C-spine/right shoulder unrevealing other than some degenerative changes.  Continue Robaxin  as needed, tramadol  as needed.  Outpatient follow-up with  PCP.   Essential hypertension At baseline on amlodipine  5 mg p.o. daily, ramipril  5  mg p.o. daily.   Hyperlipidemia Continue pravastatin    Hypoglycemia: resolved DM2 On insulin  pump at baseline.  Patient with hypoglycemic episode during hospitalization likely secondary to prolonged n.p.o. status.  Now diet advanced with toleration.  Resume insulin  pump on discharge.  Outpatient follow-up with PCP.    GERD Protonix  40 mg p.o. daily   Persistent atrial fibrillation Hx sinus bradycardia Patient underwent A-fib ablation on 05/23/2023.  Not on rate controlling medication due to bradycardia.  Continue Eliquis    Rheumatoid arthritis Follows with rheumatology outpatient, Dr. [name]. On Abatacept  outpatient; on hold currently after surgery.   Obesity Complicates all facets of care  Discharge Diagnoses:  Principal Problem:   Intra-abdominal fluid collection Active Problems:   Sinus bradycardia   CAD (coronary artery disease)   Essential hypertension   Rheumatoid arthritis involving multiple sites with positive rheumatoid factor (HCC)   Gastroesophageal reflux disease   Type 2 diabetes mellitus with other specified complication (HCC)   Obesity   Atrial fibrillation (HCC)   Hyperlipidemia associated with type 2 diabetes mellitus [facility])    Discharge Instructions  Discharge Instructions     Call MD for:  difficulty breathing, headache or visual disturbances   Complete by: As directed    Call MD for:  extreme fatigue   Complete by: As directed    Call MD for:  persistant dizziness or light-headedness   Complete by: As directed    Call MD for:  persistant nausea and vomiting   Complete by: As directed    Call MD for:  severe uncontrolled pain   Complete by: As directed    Call MD for:  temperature >100.4   Complete by: As directed    Diet - low sodium heart healthy   Complete by: As directed    Discharge wound care:   Complete by: As directed    Flush abdominal drain with  5 mL normal saline daily and monitor amount of output daily.  Change dressing surrounding abdominal drain site every 2-3 days or if becomes soiled.   Increase activity slowly   Complete by: As directed       Allergies as of 09/25/2023       Reactions   Codeine Swelling   Lipitor [atorvastatin] Other (See Comments)   Muscle aches    Invokana [canagliflozin] Other (See Comments)   Stomach upset    Losartan Nausea And Vomiting   Roxicodone  [oxycodone ] Itching   Vicodin [hydrocodone -acetaminophen ] Itching        Medication List     TAKE these medications    amLODipine  5 MG tablet Commonly known as: NORVASC  Take 1 tablet (5 mg total) by mouth daily.   amoxicillin  500 MG tablet Commonly known as: AMOXIL  Take 2 tablets (1,000 mg total) by mouth in the morning, at noon, and at bedtime for 6 days.   apixaban  5 MG Tabs tablet Commonly known as: ELIQUIS  Take 1 tablet (5 mg total) by mouth 2 (two) times daily.   ascorbic acid  500 MG tablet Commonly known as: VITAMIN C  Take 500 mg by mouth daily.   calcium carbonate 1500 (600 Ca) MG Tabs tablet Commonly known as: OSCAL Take 600 mg of elemental calcium by mouth daily.   cholecalciferol 25 MCG (1000 UNIT) tablet Commonly known as: VITAMIN D3 Take 1,000 Units by mouth daily.   Fiasp  100 UNIT/ML Soln Generic drug: Insulin  Aspart (w/Niacinamide) 100 Units. Insulin  pump   insulin  pump Soln Inject into the skin continuous. Fiasp  77 units  KRILL OIL PO Take 400 mg by mouth daily.   Lantus  100 UNIT/ML injection Generic drug: insulin  glargine Inject 16 Units into the skin as needed (If pump goes out).   levofloxacin 750 MG tablet Commonly known as: Levaquin Take 1 tablet (750 mg total) by mouth daily for 6 days.   loratadine  10 MG tablet Commonly known as: CLARITIN  Take 10 mg by mouth daily.   methocarbamol  500 MG tablet Commonly known as: ROBAXIN  Take 1 tablet (500 mg total) by mouth every 6 (six) hours as needed  for muscle spasms.   multivitamin with minerals Tabs tablet Take 1 tablet by mouth daily. Centrum   ondansetron  4 MG disintegrating tablet Commonly known as: ZOFRAN -ODT Take 1 tablet (4 mg total) by mouth every 8 (eight) hours as needed for nausea or vomiting.   ONE TOUCH ULTRA TEST test strip Generic drug: glucose blood 1 each daily. Pt says 2-3 times daily   ORENCIA  IV Inject 750 mg into the vein every 28 (twenty-eight) days.   pantoprazole  40 MG tablet Commonly known as: PROTONIX  TAKE 1 TABLET BY MOUTH DAILY   Potassium 99 MG Tabs Take 99 mg by mouth daily.   pravastatin  40 MG tablet Commonly known as: PRAVACHOL  Take 40 mg by mouth daily.   pyridOXINE 50 MG tablet Commonly known as: VITAMIN B6 Take 50 mg by mouth daily.   ramipril  5 MG capsule Commonly known as: ALTACE  Take 1 capsule (5 mg total) by mouth daily.   traMADol  50 MG tablet Commonly known as: ULTRAM  Take 1 tablet (50 mg total) by mouth every 6 (six) hours as needed for moderate pain (pain score 4-6). What changed: reasons to take this   vitamin E  180 MG (400 UNITS) capsule Take 400 Units by mouth in the morning.   zinc gluconate 50 MG tablet Take 50 mg by mouth in the morning.               Discharge Care Instructions  (From admission, onward)           Start     Ordered   09/25/23 0000  Discharge wound care:       Comments: Flush abdominal drain with 5 mL normal saline daily and monitor amount of output daily.  Change dressing surrounding abdominal drain site every 2-3 days or if becomes soiled.   09/25/23 1610            Follow-up Information     [name], MD Follow up.   Specialty: General Surgery Why: For follow up Contact information: [address] [place]  [address] [phone]         Erica Hau, MD Follow up.   Specialties: Interventional Radiology, Radiology Contact information: [address] [place]  [address] [phone]                Allergies  Allergen Reactions   Codeine Swelling   Lipitor [Atorvastatin] Other (See Comments)    Muscle aches    Invokana [Canagliflozin] Other (See Comments)    Stomach upset    Losartan Nausea And Vomiting   Roxicodone  [Oxycodone ] Itching   Vicodin [Hydrocodone -Acetaminophen ] Itching    Consultations: General surgery Interventional radiology   Procedures/Studies: DG Chest 1 View Result Date: 09/24/2023 CLINICAL DATA:  Pain. EXAM: CHEST  1 VIEW COMPARISON:  05/28/2023. FINDINGS: Low lung volumes. The heart size and mediastinal contours are within normal limits. Aortic atherosclerosis. Mild bibasilar linear and subsegmental atelectasis. No sizable pleural effusion  or pneumothorax. No acute osseous abnormality. Percutaneous drainage catheter overlies the right upper quadrant. IMPRESSION: Low lung volumes with mild bibasilar linear and subsegmental atelectasis. Electronically Signed   By: Mannie Seek M.D.   On: 09/24/2023 16:23   DG Shoulder Right Result Date: 09/24/2023 CLINICAL DATA:  Right shoulder pain. EXAM: RIGHT SHOULDER - 2+ VIEW COMPARISON:  None Available. FINDINGS: There is no evidence of acute fracture or dislocation. Mild degenerative changes of the glenohumeral joint. Mild degenerative changes of the acromioclavicular joint with intra-articular body. Soft tissues are grossly unremarkable. IMPRESSION: 1. No acute osseous abnormality. 2. Mild degenerative changes of the right glenohumeral and acromioclavicular joints. Electronically Signed   By: Mannie Seek M.D.   On: 09/24/2023 16:18   DG Cervical Spine 2 or 3 views Result Date: 09/24/2023 CLINICAL DATA:  Neck pain. EXAM: CERVICAL SPINE - 2-3 VIEW COMPARISON:  CT cervical spine dated 08/20/2021. FINDINGS: There is no evidence of acute cervical spine fracture or prevertebral soft tissue swelling. Multilevel degenerative disc height loss and endplate osteophytosis most  pronounced at C4-C7 with similar mild anterolisthesis of C7 on T1. IMPRESSION: 1. No acute osseous abnormality. 2. Multilevel degenerative changes of the cervical spine, as above. Electronically Signed   By: Mannie Seek M.D.   On: 09/24/2023 16:16   CT GUIDED PERITONEAL/RETROPERITONEAL FLUID DRAIN BY PERC CATH Result Date: 09/22/2023 CLINICAL DATA:  Gallbladder fossa abscess. Status post cholecystectomy on 09/05/2023. EXAM: CT GUIDED CATHETER DRAINAGE OF PERITONEAL ABSCESS ANESTHESIA/SEDATION: Moderate (conscious) sedation was employed during this procedure. A total of Versed  4.0 mg and Fentanyl  200 mcg was administered intravenously. Moderate Sedation Time: 30 minutes. The patient's level of consciousness and vital signs were monitored continuously by radiology nursing throughout the procedure under my direct supervision. PROCEDURE: The procedure, risks, benefits, and alternatives were explained to the patient. Questions regarding the procedure were encouraged and answered. The patient understands and consents to the procedure. A time out was performed prior to initiating the procedure. The abdominal wall was prepped with chlorhexidine  in a sterile fashion, and a sterile drape was applied covering the operative field. A sterile gown and sterile gloves were used for the procedure. Local anesthesia was provided with 1% Lidocaine . CT was performed in a supine position. From a right anterolateral approach, an 18 gauge trocar needle was advanced into a gallbladder fossa abscess. After return of fluid, a guidewire was advanced into the collection, the tract dilated and a 10 French percutaneous drain advanced over the wire. The drain was formed and additional 20 mL fluid sample withdrawn for culture analysis. The drain was flushed with sterile saline and attached to a suction bulb. It was secured at the skin with a Prolene retention suture and adhesive StatLock device. RADIATION DOSE REDUCTION: This exam was  performed according to the departmental dose-optimization program which includes automated exposure control, adjustment of the mA and/or kV according to patient size and/or use of iterative reconstruction technique. COMPLICATIONS: None FINDINGS: The gallbladder fossa abscess containing fluid and air yielded grossly purulent fluid. After drain placement there is good return of purulent fluid. A fluid sample was sent for culture analysis. There was a limited window for approach to the second probable complex fluid collection abutting the proximal duodenum. After drain placement and partial decompression of the gallbladder fossa abscess, it appeared that the adjacent collection Wert be slightly smaller and therefore it was chosen to not perform a second aspiration/drainage procedure at this time and watch this collection. IMPRESSION: CT-guided percutaneous catheter drainage  of gallbladder fossa abscess yielding purulent fluid. A 10 French drain was placed and attached to suction bulb drainage. A sample of purulent fluid was sent for culture analysis. A second adjacent fluid collection near the proximal duodenum appeared potentially slightly smaller after partial decompression of the gallbladder fossa abscess so a second aspiration drainage procedure was not performed at this time. Electronically Signed   By: Erica Hau M.D.   On: 09/22/2023 12:59   DG Abd Portable 1 View Result Date: 09/21/2023 CLINICAL DATA:  NG tube placement. EXAM: PORTABLE ABDOMEN - 1 VIEW COMPARISON:  None Available. FINDINGS: A nasogastric tube is seen with its distal tip overlying the expected region of the body of the stomach. Its distal side hole sits approximately 6.5 cm distal to the expected region of the gastroesophageal junction. The bowel gas pattern is normal. No radio-opaque calculi or other significant radiographic abnormality are seen. IMPRESSION: Nasogastric tube positioning, as described above. Electronically Signed   By:  Virgle Grime M.D.   On: 09/21/2023 18:47   NM HEPATOBILIARY LEAK (POST-SURGICAL) Result Date: 09/21/2023 CLINICAL DATA:  Acute abdominal pain, rule out bile leak. Cholecystectomy 09/05/2023 EXAM: NUCLEAR MEDICINE HEPATOBILIARY IMAGING TECHNIQUE: Sequential images of the abdomen were obtained out to 60 minutes following intravenous administration of radiopharmaceutical. RADIOPHARMACEUTICALS:  Five mCi Tc-36m  Choletec IV COMPARISON:  CT abdomen 09/21/2023 FINDINGS: Satisfactory uptake of radiopharmaceutical from the blood pool. Biliary activity at 9 minutes and bowel activity at 11 minutes. We carried out imaging for 2 full hours. No abnormal extension of radiopharmaceutical to suggest bile leak. IMPRESSION: 1. No bile leak is demonstrated. We carried out observation for 2 full hours. 2. Patent common bile duct. Electronically Signed   By: Freida Jes M.D.   On: 09/21/2023 16:41   CT ABDOMEN PELVIS W CONTRAST Result Date: 09/21/2023 CLINICAL DATA:  Acute generalized abdominal pain. EXAM: CT ABDOMEN AND PELVIS WITH CONTRAST TECHNIQUE: Multidetector CT imaging of the abdomen and pelvis was performed using the standard protocol following bolus administration of intravenous contrast. RADIATION DOSE REDUCTION: This exam was performed according to the departmental dose-optimization program which includes automated exposure control, adjustment of the mA and/or kV according to patient size and/or use of iterative reconstruction technique. CONTRAST:  75mL OMNIPAQUE  IOHEXOL  350 MG/ML SOLN COMPARISON:  September 09, 2023. FINDINGS: Lower chest: No acute abnormality. Hepatobiliary: Status post cholecystectomy. 7.3 x 5.9 cm fluid collection is noted in the gallbladder fossa which is enlarged compared to prior exam which contains air-fluid level and is highly concerning for abscess or possibly biloma. Surrounding inflammatory changes are noted. Pancreas: Unremarkable. No pancreatic ductal dilatation or surrounding  inflammatory changes. Spleen: Normal in size without focal abnormality. Adrenals/Urinary Tract: Adrenal glands appear normal. Bilateral renal cysts are noted. No hydronephrosis or renal obstruction is noted. Urinary bladder is unremarkable. Stomach/Bowel: The appendix is unremarkable. Sigmoid diverticulosis is noted without inflammation. Mild to moderate gastric distention is noted concerning for gastric outlet obstruction. This appears to be due to significant wall thickening and inflammatory changes involving the gastric antrum and proximal duodenum, most secondary to adjacent inflammation and abscess in gallbladder fossa. 6.3 x 3.9 cm fluid-filled structure is seen in this area which Stitely represent abscess as well, or potentially be inflamed proximal duodenum. Vascular/Lymphatic: Aortic atherosclerosis. No enlarged abdominal or pelvic lymph nodes. Reproductive: Prostate is unremarkable. Other: No hernia or ascites is noted. Musculoskeletal: No acute or significant osseous findings. IMPRESSION: Status post cholecystectomy. 7.3 x 5.9 cm fluid collection with air-fluid levels is  noted in gallbladder fossa which is enlarged compared to prior exam of September 09, 2023 and is most consistent with abscess or possibly biloma. There is also noted 6.3 x 3.9 cm fluid-filled structure medial to this; it is uncertain if this abnormality represents abscess, or severely inflamed gastric antrum and proximal duodenum. Mild to moderate gastric distention is noted suggesting gastric outlet obstruction. The second fluid collection does appear to extend to the subcapsular region inferiorly of right hepatic lobe. Sigmoid diverticulosis without inflammation. Aortic Atherosclerosis (ICD10-I70.0). Electronically Signed   By: Rosalene Colon M.D.   On: 09/21/2023 11:24   CT ABDOMEN PELVIS W CONTRAST Result Date: 09/09/2023 CLINICAL DATA:  Status post cholecystectomy on 09/05/2023. Patient now complains of right upper quadrant pain. EXAM:  CT ABDOMEN AND PELVIS WITH CONTRAST TECHNIQUE: Multidetector CT imaging of the abdomen and pelvis was performed using the standard protocol following bolus administration of intravenous contrast. RADIATION DOSE REDUCTION: This exam was performed according to the departmental dose-optimization program which includes automated exposure control, adjustment of the mA and/or kV according to patient size and/or use of iterative reconstruction technique. CONTRAST:  OMNIPAQUE  IOHEXOL  300 MG/ML  SOLN COMPARISON:  MRI 08/20/2023. FINDINGS: Lower chest: Atelectatic changes identified within both lung bases with posterior pleural thickening. No significant pleural effusion or consolidative changes. Hepatobiliary: No suspicious liver lesion. Postop change from cholecystectomy. Fluid collection with air-fluid level is identified within the gallbladder fossa measuring 6.1 x 3.2 by 2.9 cm. There is surrounding soft tissue stranding which wraps around the proximal descending duodenum. Small volume of free fluid extends along the inferior margin and lateral margin of the right lobe and along the right pericolic gutter. The common bile duct measures 7 mm in maximum dimension. No calcified common bile duct stones. Pancreas: No main duct dilatation, inflammation or mass. Spleen: Normal in size without focal abnormality. Adrenals/Urinary Tract: Normal adrenal glands. Bilateral simple appearing cysts are again noted. The largest arises off the lateral left kidney measuring 4.6 cm, image 40/2. No hydronephrosis or nephrolithiasis. Urinary bladder is unremarkable. Stomach/Bowel: Stomach appears distended. There is mild wall thickening involving the proximal descending duodenum with surrounding soft tissue stranding. No pathologic dilatation of the large or small bowel loops. The appendix is visualized and appears normal. Mild stool burden within the colon. Severe sigmoid diverticulosis with signs of chronic diverticular disease. No  signs of acute inflammatory change about the sigmoid colon. Vascular/Lymphatic: Aortic atherosclerosis without aneurysm. The upper abdominal vascularity is patent. No enlarged upper abdominal lymph nodes. Reproductive: Prostate gland enlargement.  No mass noted. Other: Small volume of fluid is noted along the proximal right pericolic gutter extending from the inferior margin of the right lobe. Within the right posterior pelvis there is a small amount of free fluid measuring 14.6 Hounsfield units. Musculoskeletal: No acute or suspicious osseous findings. Chronic bilateral L5 pars defects. Multilevel degenerative disc disease and facet arthropathy within the lumbar spine. IMPRESSION: 1. Postop change from cholecystectomy. Fluid collection with air-fluid level is identified within the gallbladder fossa measuring 6.1 x 3.2 x 2.9 cm. There is surrounding soft tissue stranding which wraps around the proximal descending duodenum. Differential considerations include postoperative seroma, hematoma or abscess. If there is a clinical concern for bile leak consider further evaluation with nuclear medicine hepatic biliary scan. 2. Small volume of free fluid extends along the inferior margin and lateral margin of the right lobe and along the right pericolic gutter. Trace fluid noted within the right posterior pelvis. 3. Severe sigmoid  diverticulosis with signs of chronic diverticular disease. No signs of acute inflammatory change about the sigmoid colon. 4. Prostate gland enlargement. 5. Chronic bilateral L5 pars defects. 6.  Aortic Atherosclerosis (ICD10-I70.0). Electronically Signed   By: Kimberley Penman M.D.   On: 09/09/2023 10:06     Subjective: Patient seen examined bedside, lying in bed.  Son-in-law present at bedside.  No complaints this morning.  Reports slept well.  Ready for discharge home.  Discussed will need close follow-up with his surgeon, Dr. [name] and interventional radiology for drain management.  No other  space questions or concerns at this time.  Denies headache, no dizziness, no chest pain, no palpitations, no shortness of breath, no abdominal pain, no fever/chills/night sweats, no nausea/vomiting/diarrhea, no focal weakness, no fatigue, no paresthesias.  No acute events overnight per nursing staff.  Discharge Exam: Vitals:   09/25/23 0436 09/25/23 0811  BP: (!) 147/57 (!) 159/65  Pulse: (!) 48 (!) 48  Resp: 16 16  Temp: 97.8 F (36.6 C) 98.2 F (36.8 C)  SpO2: 98% 99%   Vitals:   09/24/23 1634 09/24/23 2005 09/25/23 0436 09/25/23 0811  BP: 133/68 (!) 147/57 (!) 147/57 (!) 159/65  Pulse: (!) 51 (!) 55 (!) 48 (!) 48  Resp: 18 17 16 16   Temp: 98.3 F (36.8 C) (!) 97.4 F (36.3 C) 97.8 F (36.6 C) 98.2 F (36.8 C)  TempSrc:  Oral Oral Oral  SpO2: 95% 97% 98% 99%  Weight:      Height:        Physical Exam: GEN: NAD, alert and oriented x 3, obese HEENT: NCAT, PERRL, EOMI, sclera clear, MMM PULM: CTAB w/o wheezes/crackles, normal respiratory effort, on room air CV: RRR w/o M/G/R GI: abd soft, NTND, + BS; abdominal drain noted MSK: no peripheral edema, muscle strength globally intact 5/5 bilateral upper/lower extremities NEURO: CN II-XII intact, no focal deficits, sensation to light touch intact PSYCH: normal mood/affect Integumentary: dry/intact, no rashes or wounds    The results of significant diagnostics from this hospitalization (including imaging, microbiology, ancillary and laboratory) are listed below for reference.     Microbiology: Recent Results (from the past 240 hours)  Aerobic/Anaerobic Culture w Gram Stain (surgical/deep wound)     Status: None (Preliminary result)   Collection Time: 09/22/23 11:40 AM   Specimen: Abscess  Result Value Ref Range Status   Specimen Description ABSCESS  Final   Special Requests Normal  Final   Gram Stain FEW WBC SEEN MODERATE GRAM POSITIVE RODS   Final   Culture   Final    RARE ENTEROBACTER CLOACAE RARE ENTEROCOCCUS  FAECIUM NO ANAEROBES ISOLATED; CULTURE IN PROGRESS FOR 5 DAYS CULTURE REINCUBATED FOR BETTER GROWTH Performed at [facility], [address], [place], [place] [address]    Report Status PENDING  Incomplete   Organism ID, Bacteria ENTEROBACTER CLOACAE  Final   Organism ID, Bacteria ENTEROCOCCUS FAECIUM  Final      Susceptibility   Enterobacter cloacae - MIC*    CEFEPIME <=0.12 SENSITIVE Sensitive     CEFTAZIDIME <=1 SENSITIVE Sensitive     CIPROFLOXACIN <=0.25 SENSITIVE Sensitive     GENTAMICIN <=1 SENSITIVE Sensitive     IMIPENEM 0.5 SENSITIVE Sensitive     TRIMETH /SULFA  <=20 SENSITIVE Sensitive     PIP/TAZO <=4 SENSITIVE Sensitive ug/mL    * RARE ENTEROBACTER CLOACAE   Enterococcus faecium - MIC*    AMPICILLIN <=2 SENSITIVE Sensitive     VANCOMYCIN <=0.5 SENSITIVE Sensitive  GENTAMICIN SYNERGY SENSITIVE Sensitive     * RARE ENTEROCOCCUS FAECIUM     Labs: BNP (last 3 results) No results for input(s): "BNP" in the last 8760 hours. Basic Metabolic Panel: Recent Labs  Lab 09/21/23 0734 09/21/23 0739 09/22/23 0359 09/23/23 0547 09/24/23 0504 09/25/23 0555  NA 137 135 135 137 134* 136  K 4.3 4.2 3.7 3.5 3.6 3.1*  CL 94* 95* 97* 98 97* 101  CO2 28  --  27 29 29 29   GLUCOSE 102* 131* 92 67* 98 93  BUN 20 21 18 14 11 12   CREATININE 1.23 1.10 1.18 1.08 1.00 1.05  CALCIUM 9.0  --  8.3* 8.6* 8.1* 8.2*  MG  --   --   --   --  2.1 2.0   Liver Function Tests: Recent Labs  Lab 09/21/23 0734  AST 34  ALT 20  ALKPHOS 99  BILITOT 1.0  PROT 6.7  ALBUMIN 2.9*   Recent Labs  Lab 09/21/23 0734  LIPASE 27   No results for input(s): "AMMONIA" in the last 168 hours. CBC: Recent Labs  Lab 09/21/23 0734 09/21/23 0739 09/22/23 0359 09/23/23 0547 09/24/23 0504  WBC 10.6*  --  9.4 9.9 7.8  NEUTROABS 7.8*  --   --   --   --   HGB 12.2* 13.3 11.7* 11.4* 10.8*  HCT 36.9* 39.0 35.1* 33.5* 32.5*  MCV 91.1  --  90.5 89.1 88.8  PLT 626*  --  584* 596* 528*    Cardiac Enzymes: No results for input(s): "CKTOTAL", "CKMB", "CKMBINDEX", "TROPONINI" in the last 168 hours. BNP: Invalid input(s): "POCBNP" CBG: Recent Labs  Lab 09/24/23 0020 09/24/23 0747 09/24/23 1150 09/24/23 1634 09/25/23 0806  GLUCAP 228* 99 116* 116* 79   D-Dimer No results for input(s): "DDIMER" in the last 72 hours. Hgb A1c No results for input(s): "HGBA1C" in the last 72 hours. Lipid Profile No results for input(s): "CHOL", "HDL", "LDLCALC", "TRIG", "CHOLHDL", "LDLDIRECT" in the last 72 hours. Thyroid  function studies No results for input(s): "TSH", "T4TOTAL", "T3FREE", "THYROIDAB" in the last 72 hours.  Invalid input(s): "FREET3" Anemia work up No results for input(s): "VITAMINB12", "FOLATE", "FERRITIN", "TIBC", "IRON", "RETICCTPCT" in the last 72 hours. Urinalysis    Component Value Date/Time   COLORURINE YELLOW 09/21/2023 1216   APPEARANCEUR CLEAR 09/21/2023 1216   LABSPEC >1.046 (H) 09/21/2023 1216   PHURINE 5.0 09/21/2023 1216   GLUCOSEU NEGATIVE 09/21/2023 1216   HGBUR NEGATIVE 09/21/2023 1216   BILIRUBINUR NEGATIVE 09/21/2023 1216   KETONESUR 20 (A) 09/21/2023 1216   PROTEINUR NEGATIVE 09/21/2023 1216   UROBILINOGEN 0.2 01/13/2010 0951   NITRITE NEGATIVE 09/21/2023 1216   LEUKOCYTESUR NEGATIVE 09/21/2023 1216   Sepsis Labs Recent Labs  Lab 09/21/23 0734 09/22/23 0359 09/23/23 0547 09/24/23 0504  WBC 10.6* 9.4 9.9 7.8   Microbiology Recent Results (from the past 240 hours)  Aerobic/Anaerobic Culture w Gram Stain (surgical/deep wound)     Status: None (Preliminary result)   Collection Time: 09/22/23 11:40 AM   Specimen: Abscess  Result Value Ref Range Status   Specimen Description ABSCESS  Final   Special Requests Normal  Final   Gram Stain FEW WBC SEEN MODERATE GRAM POSITIVE RODS   Final   Culture   Final    RARE ENTEROBACTER CLOACAE RARE ENTEROCOCCUS FAECIUM NO ANAEROBES ISOLATED; CULTURE IN PROGRESS FOR 5 DAYS CULTURE  REINCUBATED FOR BETTER GROWTH Performed at [facility], [address], [place], [place] [address]  Report Status PENDING  Incomplete   Organism ID, Bacteria ENTEROBACTER CLOACAE  Final   Organism ID, Bacteria ENTEROCOCCUS FAECIUM  Final      Susceptibility   Enterobacter cloacae - MIC*    CEFEPIME <=0.12 SENSITIVE Sensitive     CEFTAZIDIME <=1 SENSITIVE Sensitive     CIPROFLOXACIN <=0.25 SENSITIVE Sensitive     GENTAMICIN <=1 SENSITIVE Sensitive     IMIPENEM 0.5 SENSITIVE Sensitive     TRIMETH /SULFA  <=20 SENSITIVE Sensitive     PIP/TAZO <=4 SENSITIVE Sensitive ug/mL    * RARE ENTEROBACTER CLOACAE   Enterococcus faecium - MIC*    AMPICILLIN <=2 SENSITIVE Sensitive     VANCOMYCIN <=0.5 SENSITIVE Sensitive     GENTAMICIN SYNERGY SENSITIVE Sensitive     * RARE ENTEROCOCCUS FAECIUM     Time coordinating discharge: Over 30 minutes  SIGNED:   Rema Care [place], DO  [facility] 09/25/2023, 10:56 AM

## 2023-09-25 NOTE — Progress Notes (Signed)
 Referring Provider(s): Marlin Simmonds, [place]   Supervising Physician: Art Largo  Patient Status:  [facility] - In-pt  Chief Complaint:  Gallbladder fossa abscess - s/p gallbladder fossa drain 09/22/23 Dr. Nereida Banning  Subjective:  Pt returning from walking with mobility team on arrival.  States he is feeling well, no complaints.  No pain at drain site.    Allergies: Codeine, Lipitor [atorvastatin], Invokana [canagliflozin], Losartan, Roxicodone  [oxycodone ], and Vicodin [hydrocodone -acetaminophen ]  Medications: Prior to Admission medications   Medication Sig Start Date End Date Taking? Authorizing Provider  amLODipine  (NORVASC ) 5 MG tablet Take 1 tablet (5 mg total) by mouth daily. 09/04/23  Yes [name], MD  amoxicillin  (AMOXIL ) 500 MG tablet Take 2 tablets (1,000 mg total) by mouth in the morning, at noon, and at bedtime for 6 days. 09/25/23 10/01/23 Yes [place], Eric J, DO  apixaban  (ELIQUIS ) 5 MG TABS tablet Take 1 tablet (5 mg total) by mouth 2 (two) times daily. 07/16/23  Yes Lenise Quince, MD  ascorbic acid  (VITAMIN C ) 500 MG tablet Take 500 mg by mouth daily.   Yes [provider]  calcium carbonate (OSCAL) 1500 (600 Ca) MG TABS tablet Take 600 mg of elemental calcium by mouth daily.   Yes [provider]  cholecalciferol (VITAMIN D3) 25 MCG (1000 UNIT) tablet Take 1,000 Units by mouth daily.   Yes [provider]  Insulin  Aspart, w/Niacinamide, (FIASP ) 100 UNIT/ML SOLN 100 Units. Insulin  pump 09/27/22  Yes [provider]  insulin  glargine (LANTUS ) 100 UNIT/ML injection Inject 16 Units into the skin as needed (If pump goes out).   Yes [provider]  Insulin  Human (INSULIN  PUMP) SOLN Inject into the skin continuous. Fiasp  77 units   Yes [provider]  KRILL OIL PO Take 400 mg by mouth daily.   Yes [provider]  levofloxacin (LEVAQUIN) 750 MG tablet Take 1 tablet (750 mg total) by mouth daily for 6 days. 09/25/23  10/01/23 Yes [place], Eric J, DO  loratadine  (CLARITIN ) 10 MG tablet Take 10 mg by mouth daily.   Yes [provider]  methocarbamol  (ROBAXIN ) 500 MG tablet Take 1 tablet (500 mg total) by mouth every 6 (six) hours as needed for muscle spasms. 09/25/23  Yes [place], Rema Care, DO  Multiple Vitamin (MULTIVITAMIN WITH MINERALS) TABS Take 1 tablet by mouth daily. Centrum   Yes [provider]  ondansetron  (ZOFRAN -ODT) 4 MG disintegrating tablet Take 1 tablet (4 mg total) by mouth every 8 (eight) hours as needed for nausea or vomiting. 09/09/23  Yes Ninetta Basket, MD  pantoprazole  (PROTONIX ) 40 MG tablet TAKE 1 TABLET BY MOUTH DAILY 06/27/23  Yes Mealor, Augustus E, MD  Potassium 99 MG TABS Take 99 mg by mouth daily.   Yes [provider]  pyridOXINE (VITAMIN B6) 50 MG tablet Take 50 mg by mouth daily.   Yes [provider]  ramipril  (ALTACE ) 5 MG capsule Take 1 capsule (5 mg total) by mouth daily. 09/04/23  Yes [name], MD  vitamin E  400 UNIT capsule Take 400 Units by mouth in the morning.   Yes [provider]  zinc gluconate 50 MG tablet Take 50 mg by mouth in the morning.   Yes [provider]  Abatacept  (ORENCIA  IV) Inject 750 mg into the vein every 28 (twenty-eight) days. Patient not taking: Reported on 08/15/2023    [provider]  ONE TOUCH ULTRA TEST test strip 1 each daily. Pt says 2-3  times daily 05/26/16   [provider]  pravastatin  (PRAVACHOL ) 40 MG tablet Take 40 mg by mouth daily. Patient not taking: Reported on 08/15/2023    [provider]  traMADol  (ULTRAM ) 50 MG tablet Take 1 tablet (50 mg total) by mouth every 6 (six) hours as needed for moderate pain (pain score 4-6). 09/25/23   [place], Rema Care, DO     Vital Signs: BP (!) 159/65 (BP Location: Right Arm)   Pulse (!) 48   Temp 98.2 F (36.8 C) (Oral)   Resp 16   Ht 5\' 7"  (1.702 m)   Wt 190 lb 8 oz (86.4 kg)   SpO2 99%   BMI 29.84 kg/m    Physical Exam Constitutional:      Appearance: Normal appearance.  Skin:    Comments: Gallbladder fossa abscess drain F/A easily  Skin clean, dry, without signs of infection; statlock/suture in place  Output 25 mL, dark blue/black, thin  Neurological:     Mental Status: He is alert and oriented to person, place, and time.  Psychiatric:        Mood and Affect: Mood normal.        Behavior: Behavior normal.      Labs:  CBC: Recent Labs    09/21/23 0734 09/21/23 0739 09/22/23 0359 09/23/23 0547 09/24/23 0504  WBC 10.6*  --  9.4 9.9 7.8  HGB 12.2* 13.3 11.7* 11.4* 10.8*  HCT 36.9* 39.0 35.1* 33.5* 32.5*  PLT 626*  --  584* 596* 528*    COAGS: Recent Labs    05/28/23 1737 06/01/23 1343 07/30/23 0850 08/13/23 0852 08/17/23 1413 09/22/23 0359  INR 2.8*   < > 2.3 2.3 1.1 1.2  APTT 50*  --   --   --   --   --    < > = values in this interval not displayed.    BMP: Recent Labs    09/22/23 0359 09/23/23 0547 09/24/23 0504 09/25/23 0555  NA 135 137 134* 136  K 3.7 3.5 3.6 3.1*  CL 97* 98 97* 101  CO2 27 29 29 29   GLUCOSE 92 67* 98 93  BUN 18 14 11 12   CALCIUM 8.3* 8.6* 8.1* 8.2*  CREATININE 1.18 1.08 1.00 1.05  GFRNONAA >60 >60 >60 >60    LIVER FUNCTION TESTS: Recent Labs    08/17/23 1413 09/03/23 0807 09/09/23 0907 09/21/23 0734  BILITOT 1.6* 0.9 1.4* 1.0  AST 453* 28 51* 34  ALT 503* 32 51* 20  ALKPHOS 468* 149* 157* 99  PROT 6.3 6.3* 7.3 6.7  ALBUMIN 4.5 3.7 4.2 2.9*    Assessment and Plan:  Pt is s/p gallbladder fossa abscess drain 09/22/23 with Dr. Nereida Banning.  Pt seen today for drain teaching prior to discharge.   Drain teaching was performed.  All pt questions answered.    Drain Location: Gallbladder fossa drain  Size: Fr size: 10 Fr Date of placement: 09/22/23  Currently to: Drain collection device: suction bulb 24 hour output:  Output by Drain (mL) 09/23/23 0701 - 09/23/23 1900 09/23/23 1901 - 09/24/23 0700 09/24/23 0701 -  09/24/23 1900 09/24/23 1901 - 09/25/23 0700 09/25/23 0701 - 09/25/23 1149  Closed System Drain 1 Right RUQ Bulb (JP) 10 Fr. 25 40 50      Interval imaging/drain manipulation:  None  Current examination: Flushes/aspirates easily.  Insertion site unremarkable. Suture and stat lock in place. Dressed appropriately.   Plan: Continue TID flushes with 5 cc NS. Record output  Q shift. Dressing changes QD or PRN if soiled.  Call IR APP or on call IR MD if difficulty flushing or sudden change in drain output.  Repeat imaging/possible drain injection once output < 10 mL/QD (excluding flush material). Consideration for drain removal if output is < 10 mL/QD (excluding flush material), pending discussion with the providing surgical service.  Discharge planning: Please contact IR APP or on call IR MD prior to patient d/c to ensure appropriate follow up plans are in place. Typically patient will follow up with IR clinic 10-14 days post d/c for repeat imaging/possible drain injection. IR scheduler will contact patient with date/time of appointment. Patient will need to flush drain QD with 5 cc NS, record output QD, dressing changes every 2-3 days or earlier if soiled.   Electronically Signed: Pasty Bongo, PA-C 09/25/2023, 11:45 AM    I spent a total of 25 Minutes at the the patient's bedside AND on the patient's hospital floor or unit, greater than 50% of which was counseling/coordinating care for image guided gallbladder fossa abscess drain.

## 2023-09-26 ENCOUNTER — Telehealth: Payer: Self-pay | Admitting: Internal Medicine

## 2023-09-26 ENCOUNTER — Telehealth: Payer: Self-pay | Admitting: Rheumatology

## 2023-09-26 ENCOUNTER — Telehealth: Payer: Self-pay

## 2023-09-26 ENCOUNTER — Other Ambulatory Visit: Payer: Self-pay | Admitting: Internal Medicine

## 2023-09-26 ENCOUNTER — Other Ambulatory Visit: Payer: Self-pay

## 2023-09-26 ENCOUNTER — Other Ambulatory Visit: Payer: Self-pay | Admitting: Interventional Radiology

## 2023-09-26 ENCOUNTER — Other Ambulatory Visit (HOSPITAL_COMMUNITY): Payer: Self-pay

## 2023-09-26 DIAGNOSIS — B370 Candidal stomatitis: Secondary | ICD-10-CM

## 2023-09-26 MED ORDER — SODIUM CHLORIDE FLUSH 0.9 % IV SOLN
10.0000 mL | INTRAVENOUS | 1 refills | Status: DC | PRN
  Filled 2023-09-26: qty 300, 30d supply, fill #0

## 2023-09-26 MED ORDER — NYSTATIN 100000 UNIT/ML MT SUSP: 5.0000 mL | Freq: Three times a day (TID) | OROMUCOSAL | 0 refills | Status: DC | PRN

## 2023-09-26 MED ORDER — LIDOCAINE VISCOUS HCL 2 % MT SOLN: 5.0000 mL | Freq: Three times a day (TID) | OROMUCOSAL | 0 refills | Status: DC | PRN

## 2023-09-26 NOTE — Telephone Encounter (Signed)
 Copied from CRM [id number]. Topic: Clinical - Prescription Issue >> Robidoux 14, 2025 12:19 PM Rennis Case wrote: Reason for CRM: [facility] calling to state they are unable to fill the rx for magic mouthwash (nystatin , lidocaine , diphenhydrAMINE , alum & mag hydroxide) suspension.   Unable to fill rx due to it being a compound rx and they are not a compound pharmacy. Requesting for rx to be sent to specialty pharmacy. Fernando Hoyer states there is Engineer, building services that is a [facility] and is only a block away from their location.

## 2023-09-26 NOTE — Telephone Encounter (Signed)
 Daughter states he is having thrush really bad in his mouth. Not as bad as it was but its still bad. Was released from the hospital yesterday. Has finished nystatin  liquid but wanting to know if something else can be called in. Willing to communicate by his mychart. Pls advise

## 2023-09-26 NOTE — Telephone Encounter (Signed)
 Joel Jones (daughter in Social worker )called needs for the compound mouth wash sent to [place], this pharmacy does the compound medicine.

## 2023-09-26 NOTE — Telephone Encounter (Signed)
 Pt called requesting a Mychart message back on what he will need to do to be cleared to take Orencia  again.

## 2023-09-26 NOTE — Transitions of Care (Post Inpatient/ED Visit) (Signed)
   09/26/2023  Name: Joel [name] MRN: [id number] DOB: 12-26-1945  Today's TOC FU Call Status: Today's TOC FU Call Status:: Unsuccessful Call (1st Attempt) Unsuccessful Call (1st Attempt) Date: 09/26/23  Attempted to reach the patient regarding the most recent Inpatient/ED visit.  Follow Up Plan: Additional outreach attempts will be made to reach the patient to complete the Transitions of Care (Post Inpatient/ED visit) call.   Orpha Blade, RN, BSN, CEN [employer]- Transition of Care Team.  [facility] [phone]

## 2023-09-26 NOTE — Telephone Encounter (Signed)
 Rx sent to [facility] per pt request.

## 2023-09-27 ENCOUNTER — Telehealth: Payer: Self-pay

## 2023-09-27 LAB — AEROBIC/ANAEROBIC CULTURE W GRAM STAIN (SURGICAL/DEEP WOUND): Special Requests: NORMAL

## 2023-09-27 NOTE — Transitions of Care (Post Inpatient/ED Visit) (Signed)
 09/27/2023  Name: Joel [name] MRN: [id number] DOB: 15-Jul-1945  Today's TOC FU Call Status: Today's TOC FU Call Status:: Successful TOC FU Call Completed TOC FU Call Complete Date: 09/27/23 Patient's Name and Date of Birth confirmed.  Transition Care Management Follow-up Telephone Call Date of Discharge: 09/25/23 Discharge Facility: [facility]) Type of Discharge: Inpatient Admission How have you been since you were released from the hospital?: Better (reports that he is weak.  no appetite- but is eating) Any questions or concerns?: No  Items Reviewed: Did you receive and understand the discharge instructions provided?: Yes Medications obtained,verified, and reconciled?: Yes (Medications Reviewed) Any new allergies since your discharge?: No Dietary orders reviewed?: Yes Type of Diet Ordered:: low sodium heart healthy Do you have support at home?: Yes People in Home [RPT]: child(ren), adult Name of Support/Comfort Primary Source: many family  Medications Reviewed Today: Medications Reviewed Today     Reviewed by Vanetta Generous, RN (Registered Nurse) on 09/27/23 at 1328  Med List Status: <None>   Medication Order Taking? Sig Documenting Provider Last Dose Status Informant  Abatacept  (ORENCIA  IV) [id number] No Inject 750 mg into the vein every 28 (twenty-eight) days.  Patient not taking: Reported on 08/15/2023   [provider] Not Taking Active Family Member, Pharmacy Records           Med Note Shann Darnel, [place] A   Mon Aug 06, 2023  1:52 PM) On hold for procedure   amLODipine  (NORVASC ) 5 MG tablet [id number] Yes Take 1 tablet (5 mg total) by mouth daily. [name], MD Taking Active Family Member, Pharmacy Records  amoxicillin  (AMOXIL ) 500 MG tablet [id number] Yes Take 2 tablets (1,000 mg total) by mouth in the morning, at noon, and at bedtime for 6 days. [place], Rema Care, DO Taking Active   apixaban  (ELIQUIS ) 5 MG TABS tablet [id number] Yes Take 1 tablet (5 mg total)  by mouth 2 (two) times daily. Lenise Quince, MD Taking Active Family Member, Pharmacy Records  ascorbic acid  (VITAMIN C ) 500 MG tablet [id number] Yes Take 500 mg by mouth daily. [provider] Taking Active Family Member, Pharmacy Records  calcium carbonate (OSCAL) 1500 (600 Ca) MG TABS tablet [id number] Yes Take 600 mg of elemental calcium by mouth daily. [provider] Taking Active Family Member, Pharmacy Records  cholecalciferol (VITAMIN D3) 25 MCG (1000 UNIT) tablet [id number] Yes Take 1,000 Units by mouth daily. [provider] Taking Active Family Member, Pharmacy Records  Insulin  Aspart, w/Niacinamide, (FIASP ) 100 UNIT/ML SOLN [id number] Yes 100 Units. Insulin  pump [provider] Taking Active Family Member, Pharmacy Records  insulin  glargine (LANTUS ) 100 UNIT/ML injection [id number] Yes Inject 16 Units into the skin as needed (If pump goes out). [provider] Taking Active Family Member, Pharmacy Records           Med Note Linward Rick   Wed May 23, 2023 10:43 AM) Pump is in place   Insulin  Human (INSULIN  PUMP) SOLN [id number] Yes Inject into the skin continuous. Fiasp  77 units [provider] Taking Active Family Member, Pharmacy Records  KRILL OIL PO [id number] Yes Take 400 mg by mouth daily. [provider] Taking Active Family Member, Pharmacy Records  levofloxacin (LEVAQUIN) 750 MG tablet [id number] Yes Take 1 tablet (750 mg total) by mouth daily for 6 days. [place], Rema Care, DO Taking Active   loratadine  (CLARITIN ) 10 MG tablet [id number] Yes Take 10 mg by mouth daily. [provider] Taking Active Family  Member, Pharmacy Records  magic mouthwash (nystatin , lidocaine , diphenhydrAMINE , alum & mag hydroxide) suspension [id number] Yes Swish and swallow 5 mLs 3 (three) times daily as needed for mouth pain. [name], MD Taking Active   methocarbamol  (ROBAXIN ) 500 MG tablet [id number] Yes Take 1 tablet (500 mg total)  by mouth every 6 (six) hours as needed for muscle spasms. [place], Rema Care, DO Taking Active   Multiple Vitamin (MULTIVITAMIN WITH MINERALS) TABS [id number] Yes Take 1 tablet by mouth daily. Centrum [provider] Taking Active Family Member, Pharmacy Records  ondansetron  (ZOFRAN -ODT) 4 MG disintegrating tablet [id number] No Take 1 tablet (4 mg total) by mouth every 8 (eight) hours as needed for nausea or vomiting.  Patient not taking: Reported on 09/27/2023   Ninetta Basket, MD Not Taking Active Family Member, Pharmacy Records  ONE [facility] ULTRA TEST test strip [id number] Yes 1 each daily. Pt says 2-3 times daily [provider] Taking Active Family Member, Pharmacy Records  pantoprazole  (PROTONIX ) 40 MG tablet [id number] Yes TAKE 1 TABLET BY MOUTH DAILY Mealor, Augustus E, MD Taking Active Family Member, Pharmacy Records  Potassium 99 MG TABS [id number] Yes Take 99 mg by mouth daily. [provider] Taking Active Family Member, Pharmacy Records  pravastatin  (PRAVACHOL ) 40 MG tablet [id number] Yes Take 40 mg by mouth daily. [provider] Taking Active Family Member, Pharmacy Records           Med Note Shann Darnel, [place] A   Mon Aug 06, 2023  2:03 PM) On hold  pyridOXINE (VITAMIN B6) 50 MG tablet [id number] Yes Take 50 mg by mouth daily. [provider] Taking Active Family Member, Pharmacy Records  ramipril  (ALTACE ) 5 MG capsule [id number] Yes Take 1 capsule (5 mg total) by mouth daily. [name], MD Taking Active Family Member, Pharmacy Records  sodium chloride  flush 0.9 % SOLN injection [id number] Yes Use 1 flush as directed as needed Carim, Gordy Lauber, PA-C Taking Active   traMADol  (ULTRAM ) 50 MG tablet [id number] Yes Take 1 tablet (50 mg total) by mouth every 6 (six) hours as needed for moderate pain (pain score 4-6). [place], Eric J, DO Taking Active   vitamin E  400 UNIT capsule [id number] Yes Take 400 Units by mouth in the morning. [provider]  Taking Active Family Member, Pharmacy Records  zinc gluconate 50 MG tablet [id number] Yes Take 50 mg by mouth in the morning. [provider] Taking Active Family Member, Pharmacy Records            Home Care and Equipment/Supplies: Were Home Health Services Ordered?: No Any new equipment or medical supplies ordered?: No  Functional Questionnaire: Do you need assistance with bathing/showering or dressing?: No Do you need assistance with meal preparation?: No Do you need assistance with eating?: No Do you have difficulty maintaining continence: No Do you need assistance with getting out of bed/getting out of a chair/moving?: No Do you have difficulty managing or taking your medications?: No  Follow up appointments reviewed: PCP Follow-up appointment confirmed?: No MD Provider Line Number:[phone] Given: No Specialist Hospital Follow-up appointment confirmed?: Yes Date of Specialist follow-up appointment?: 10/04/23 Follow-Up Specialty Provider:: Dr. [name] Do you need transportation to your follow-up appointment?: No Do you understand care options if your condition(s) worsen?: Yes-patient verbalized understanding  SDOH Interventions Today    Flowsheet Row Most Recent Value  SDOH Interventions   Food Insecurity Interventions Intervention Not Indicated  Housing Interventions Intervention Not Indicated  Transportation Interventions Intervention Not Indicated  Utilities Interventions Intervention Not Indicated      Patient reports that he is weak. States abdominal pain with movement or cough.  Family is assisting with medications, meals and flushing drain. Patient with questions about amoxil  vs Augmentin  and levaquin. Messages and calls to radiology. 1615pm  no return call as of yet. In basket message sent to [facility].  Called patient to inform that I am waiting for a response from MD.  And that I would call patient as soon as I hear from MD.  Patient voiced  understanding and appreciation.    Orpha Blade, RN, BSN, CEN [employer]- Transition of Care Team.  [facility] [phone]

## 2023-09-28 ENCOUNTER — Telehealth: Payer: Self-pay

## 2023-09-28 ENCOUNTER — Encounter: Payer: Self-pay | Admitting: Internal Medicine

## 2023-09-28 ENCOUNTER — Telehealth: Admitting: Internal Medicine

## 2023-09-28 DIAGNOSIS — M5030 Other cervical disc degeneration, unspecified cervical region: Secondary | ICD-10-CM | POA: Diagnosis not present

## 2023-09-28 DIAGNOSIS — R188 Other ascites: Secondary | ICD-10-CM | POA: Diagnosis not present

## 2023-09-28 DIAGNOSIS — I10 Essential (primary) hypertension: Secondary | ICD-10-CM | POA: Diagnosis not present

## 2023-09-28 DIAGNOSIS — Z9889 Other specified postprocedural states: Secondary | ICD-10-CM

## 2023-09-28 DIAGNOSIS — Z9049 Acquired absence of other specified parts of digestive tract: Secondary | ICD-10-CM

## 2023-09-28 DIAGNOSIS — Z09 Encounter for follow-up examination after completed treatment for conditions other than malignant neoplasm: Secondary | ICD-10-CM | POA: Insufficient documentation

## 2023-09-28 MED ORDER — METHOCARBAMOL 500 MG PO TABS: 500.0000 mg | ORAL_TABLET | Freq: Two times a day (BID) | ORAL | 1 refills | Status: DC | PRN

## 2023-09-28 NOTE — Assessment & Plan Note (Signed)
 X-ray of cervical spine reviewed Robaxin  as needed for neck muscle spasms, advised to limit up to twice in a day

## 2023-09-28 NOTE — Progress Notes (Addendum)
 Virtual Visit via Video Note   Because of Joel Panagakos Schleich Sr.'s co-morbid illnesses, he is at least at moderate risk for complications without adequate follow up.  This format is felt to be most appropriate for this patient at this time.  All issues noted in this document were discussed and addressed.  A limited physical exam was performed with this format.      Evaluation Performed:  Follow-up visit  Date:  09/28/2023   ID:  Joel Patches Clegg Sr., DOB 1946-01-06, MRN [id number]  Patient Location: Home Provider Location: Office/Clinic  Participants: Patient Location of Patient: Home Location of Provider: Telehealth Consent was obtain for visit to be over via telehealth. I verified that I am speaking with the correct person using two identifiers.  PCP:  [name], MD   Chief Complaint: Hospital discharge follow-up  History of Present Illness:    Joel Flaugh Agresta Sr. is a 78 y.o. male with PMH of HTN, type II DM with HLD, RA, OA and obesity who has a video visit for hospital discharge follow-up.  He was admitted at [facility] from 09/21/23-09/25/23 for intra-abdominal fluid collection status post robotic cholecystectomy (09/05/23).  He presented with abdominal pain, nausea and vomiting.  CT of abdomen/pelvis with contrast showed intra-abdominal fluid collection with air-fluid levels in the gallbladder fossa, about 7.3 X 5.9 cm, with concern of gastric outlet obstruction.  IR was consulted and patient underwent drain placement on 09/22/23.  Drain culture was positive for Enterobacter cloacae and Enterobacter faecium.  He was treated with IV Zosyn during hospitalization with improvement in symptoms.  He was later discharged with oral amoxicillin  and levofloxacin.  He denies any episode of fever or chills since returning to home.  He still has drain in place, which shows blood-tinged discharge, about 30-50 ml in a day.  He reports an episode of hitting the drain with the right arm  yesterday, after which he noticed blood-tinged discharge, which was serous before the episode yesterday.  He denies any increased amount of drainage since that episode.  Sutures around the drain are intact.  Denies any oozing from the site.  He reports right neck and shoulder pain, that started during hospital visit.  He had x-ray of cervical spine, which showed DDD of cervical spine.  He was given Robaxin  from the hospital, and requests refill of it.  The patient does not have symptoms concerning for COVID-19 infection (fever, chills, cough, or new shortness of breath).   Past Medical, Surgical, Social History, Allergies, and Medications have been Reviewed.  Past Medical History:  Diagnosis Date   A-fib [facility])    AKI (acute kidney injury) (HCC) 05/18/2021   Anemia    Arthritis    "all over"   Bradycardia    Chronic bronchitis (HCC)    "get it just about q yr" (03/17/2014)   Complication of anesthesia    difficulty waking up   Daily headache    "here lately" (03/17/2014); relates to sinuses   GERD (gastroesophageal reflux disease)    Hard of hearing    hearing aides bilat   Hiatal hernia    History of blood transfusion 2008   "related to OR"   History of kidney stones    Hyperlipidemia    Hypertension    Pneumonia 1972 X 1   Rheumatoid arthritis (HCC)    Type II diabetes mellitus (HCC)    Wears glasses    Past Surgical History:  Procedure Laterality  Date   ATRIAL FIBRILLATION ABLATION N/A 05/23/2023   Procedure: ATRIAL FIBRILLATION ABLATION;  Surgeon: [name], MD;  Location: [facility] INVASIVE CV LAB;  Service: Cardiovascular;  Laterality: N/A;   broken finger      surgical repaired left hand 2nd finger    CARDIOVERSION N/A 11/08/2022   Procedure: CARDIOVERSION;  Surgeon: [name], MD;  Location: [facility] INVASIVE CV LAB;  Service: Cardiovascular;  Laterality: N/A;   cyst removed      per left knee/posteriorly   CYSTECTOMY Left 2009   "behind knee"   INGUINAL HERNIA  REPAIR Right ?2010   JOINT REPLACEMENT     KNEE ARTHROSCOPY Bilateral 1980's   LIVER BIOPSY N/A 09/05/2023   Procedure: BIOPSY, LIVER;  Surgeon: [name], MD;  Location: [facility] ORS;  Service: General;  Laterality: N/A;   PARTIAL KNEE ARTHROPLASTY Right 08/27/2015   Procedure: RIGHT UNICOMPARTMENTAL KNEE ARTHROPLASTY;  Surgeon: [name], MD;  Location: [facility] ORS;  Service: Orthopedics;  Laterality: Right;   REVISION TOTAL KNEE ARTHROPLASTY Left 2008   SHOULDER SURGERY Left 2019   TOTAL KNEE ARTHROPLASTY Left 2003     Current Meds  Medication Sig   Abatacept  (ORENCIA  IV) Inject 750 mg into the vein every 28 (twenty-eight) days.   amLODipine  (NORVASC ) 5 MG tablet Take 1 tablet (5 mg total) by mouth daily.   amoxicillin  (AMOXIL ) 500 MG tablet Take 2 tablets (1,000 mg total) by mouth in the morning, at noon, and at bedtime for 6 days.   apixaban  (ELIQUIS ) 5 MG TABS tablet Take 1 tablet (5 mg total) by mouth 2 (two) times daily.   ascorbic acid  (VITAMIN C ) 500 MG tablet Take 500 mg by mouth daily.   calcium carbonate (OSCAL) 1500 (600 Ca) MG TABS tablet Take 600 mg of elemental calcium by mouth daily.   cholecalciferol (VITAMIN D3) 25 MCG (1000 UNIT) tablet Take 1,000 Units by mouth daily.   Insulin  Aspart, w/Niacinamide, (FIASP ) 100 UNIT/ML SOLN 100 Units. Insulin  pump   insulin  glargine (LANTUS ) 100 UNIT/ML injection Inject 16 Units into the skin as needed (If pump goes out).   Insulin  Human (INSULIN  PUMP) SOLN Inject into the skin continuous. Fiasp  77 units   KRILL OIL PO Take 400 mg by mouth daily.   levofloxacin (LEVAQUIN) 750 MG tablet Take 1 tablet (750 mg total) by mouth daily for 6 days.   loratadine  (CLARITIN ) 10 MG tablet Take 10 mg by mouth daily.   magic mouthwash (nystatin , lidocaine , diphenhydrAMINE , alum & mag hydroxide) suspension Swish and swallow 5 mLs 3 (three) times daily as needed for mouth pain.   Multiple Vitamin (MULTIVITAMIN WITH MINERALS) TABS Take 1 tablet  by mouth daily. Centrum   ondansetron  (ZOFRAN -ODT) 4 MG disintegrating tablet Take 1 tablet (4 mg total) by mouth every 8 (eight) hours as needed for nausea or vomiting.   ONE TOUCH ULTRA TEST test strip 1 each daily. Pt says 2-3 times daily   pantoprazole  (PROTONIX ) 40 MG tablet TAKE 1 TABLET BY MOUTH DAILY   Potassium 99 MG TABS Take 99 mg by mouth daily.   pravastatin  (PRAVACHOL ) 40 MG tablet Take 40 mg by mouth daily.   pyridOXINE (VITAMIN B6) 50 MG tablet Take 50 mg by mouth daily.   ramipril  (ALTACE ) 5 MG capsule Take 1 capsule (5 mg total) by mouth daily.   sodium chloride  flush 0.9 % SOLN injection Use 1 flush as directed as needed   traMADol  (ULTRAM ) 50 MG tablet Take 1 tablet (50  mg total) by mouth every 6 (six) hours as needed for moderate pain (pain score 4-6).   vitamin E  400 UNIT capsule Take 400 Units by mouth in the morning.   zinc gluconate 50 MG tablet Take 50 mg by mouth in the morning.   [DISCONTINUED] methocarbamol  (ROBAXIN ) 500 MG tablet Take 1 tablet (500 mg total) by mouth every 6 (six) hours as needed for muscle spasms.     Allergies:   Codeine, Lipitor [atorvastatin], Invokana [canagliflozin], Losartan, Roxicodone  [oxycodone ], and Vicodin [hydrocodone -acetaminophen ]   ROS:   Please see the history of present illness. All other systems reviewed and are negative.   Labs/Other Tests and Data Reviewed:    Recent Labs: 09/21/2023: ALT 20 09/24/2023: Hemoglobin 10.8; Platelets 528 09/25/2023: BUN 12; Creatinine, Ser 1.05; Magnesium 2.0; Potassium 3.1; Sodium 136   Recent Lipid Panel Lab Results  Component Value Date/Time   CHOL 120 07/03/2023 08:09 AM   CHOL 120 05/20/2019 11:02 AM   TRIG 103 07/03/2023 08:09 AM   HDL 34 (L) 07/03/2023 08:09 AM   HDL 41 05/20/2019 11:02 AM   CHOLHDL 3.5 07/03/2023 08:09 AM   LDLCALC 65 07/03/2023 08:09 AM   LDLCALC 69 05/20/2019 11:02 AM    Wt Readings from Last 3 Encounters:  09/24/23 190 lb 8 oz (86.4 kg)  09/09/23 195 lb  (88.5 kg)  09/05/23 193 lb 12.6 oz (87.9 kg)     Objective:    Vital Signs:  There were no vitals taken for this visit.   VITAL SIGNS:  reviewed GEN:  no acute distress EYES:  sclerae anicteric, EOMI - Extraocular Movements Intact RESPIRATORY:  normal respiratory effort, symmetric expansion NEURO:  alert and oriented x 3, no obvious focal deficit PSYCH:  normal affect  ASSESSMENT & PLAN:    Intra-abdominal fluid collection Status post robotic laparoscopic cholecystectomy CT abdomen/pelvis reviewed Has drain in place - drainage slightly blood-tinged Reports concern for bleeding from the drain, advised to wait for 2 days for the oozing to stop If persistent or worsening of blood-tinged discharge, advised to go to ER  Fluid culture showed E. Cloacae and E. Faecium, was given Zosyn during hospitalization, complete course of amoxicillin  and levofloxacin Currently afebrile Follow-up with general surgeon in outpatient setting  Hospital discharge follow-up Hospital chart reviewed, including discharge summary Medications reconciled and reviewed with the patient in detail  S/P laparoscopic cholecystectomy Had complicated course, has intra-abdominal fluid collection - drain in place Follow-up with general surgeon in the outpatient setting  Essential hypertension BP Readings from Last 1 Encounters:  09/25/23 (!) 159/65   Usually well-controlled with amlodipine  and Ramipril  Reports BP being around 120s/70s at home, advised to hold ramipril  if BP less than 110/70 as he has mild dizziness and flushing upon standing up Counseled for compliance with the medications Advised DASH diet and moderate exercise/walking as tolerated  DDD (degenerative disc disease), cervical X-ray of cervical spine reviewed Robaxin  as needed for neck muscle spasms, advised to limit up to twice in a day    I discussed the assessment and treatment plan with the patient. The patient was provided an opportunity  to ask questions, and all were answered. The patient agreed with the plan and demonstrated an understanding of the instructions.   The patient was advised to call back or seek an in-person evaluation if the symptoms worsen or if the condition fails to improve as anticipated.  The above assessment and management plan was discussed with the patient. The patient verbalized understanding of  and has agreed to the management plan.   Medication Adjustments/Labs and Tests Ordered: Current medicines are reviewed at length with the patient today.  Concerns regarding medicines are outlined above.   Tests Ordered: No orders of the defined types were placed in this encounter.   Medication Changes: Meds ordered this encounter  Medications   methocarbamol  (ROBAXIN ) 500 MG tablet    Sig: Take 1 tablet (500 mg total) by mouth every 12 (twelve) hours as needed for muscle spasms.    Dispense:  20 tablet    Refill:  1     Note: This dictation was prepared with Dragon dictation along with smaller phrase technology. Similar sounding words can be transcribed inadequately or [place]augg not be corrected upon review. Any transcriptional errors that result from this process are unintentional.      Disposition:  Follow up  Signed, [name], MD  09/28/2023 3:17 PM     [facility]

## 2023-09-28 NOTE — Assessment & Plan Note (Signed)
 BP Readings from Last 1 Encounters:  09/25/23 (!) 159/65   Usually well-controlled with amlodipine  and Ramipril  Reports BP being around 120s/70s at home, advised to hold ramipril  if BP less than 110/70 as he has mild dizziness and flushing upon standing up Counseled for compliance with the medications Advised DASH diet and moderate exercise/walking as tolerated

## 2023-09-28 NOTE — Assessment & Plan Note (Signed)
 Hospital chart reviewed, including discharge summary Medications reconciled and reviewed with the patient in detail

## 2023-09-28 NOTE — Patient Outreach (Signed)
  09/28/2023  Patient ID: Kelby Patches Stille Sr., male   DOB: 27-Aug-1945, 78 y.o.   MRN: [id number]  In basket messages to Dr. Erica Hau,  Dr. [name] and Dr. Lydia Sams about concern about antibiotics and confusing information on AVS and discharge note. Dr. Lydia Sams has agreed to review chart and call patient this afternoon.  I placed call back to patient to inform.  Patient has a virtual appointment at 2pm today.   Family mentioned increase in drainage of bulb suction and color change. Family called IR and spoke with a provider and was informed not to worry about it.   Encouraged patient and family to discuss with PCP later today.   Reviewed 30 day TOC program and patient and family feel like they are managing well.   Reviewed my contact information and my availability to assist as needed.   Orpha Blade, RN, BSN, CEN [employer]- Transition of Care Team.  [facility] [phone]

## 2023-09-28 NOTE — Assessment & Plan Note (Signed)
 Had complicated course, has intra-abdominal fluid collection - drain in place Follow-up with general surgeon in the outpatient setting

## 2023-09-28 NOTE — Assessment & Plan Note (Addendum)
 Status post robotic laparoscopic cholecystectomy CT abdomen/pelvis reviewed Has drain in place - drainage slightly blood-tinged Reports concern for bleeding from the drain, advised to wait for 2 days for the oozing to stop If persistent or worsening of blood-tinged discharge, advised to go to ER  Fluid culture showed E. Cloacae and E. Faecium, was given Zosyn during hospitalization, complete course of amoxicillin  and levofloxacin Currently afebrile Follow-up with general surgeon in outpatient setting

## 2023-09-28 NOTE — Patient Instructions (Signed)
 Please avoid taking ramipril  if blood pressure is less than 110/70.  Please maintain adequate hydration and eat at regular intervals.  If you have increased amount of drainage or bleeding from the insertion site, please go to ER.  Please take Robaxin , up to twice daily as needed for neck muscle spasms.

## 2023-09-30 ENCOUNTER — Encounter: Payer: Self-pay | Admitting: Internal Medicine

## 2023-10-01 ENCOUNTER — Other Ambulatory Visit: Payer: Self-pay | Admitting: Internal Medicine

## 2023-10-01 DIAGNOSIS — J4489 Other specified chronic obstructive pulmonary disease: Secondary | ICD-10-CM

## 2023-10-01 MED ORDER — ALBUTEROL SULFATE HFA 108 (90 BASE) MCG/ACT IN AERS: 2.0000 | INHALATION_SPRAY | Freq: Four times a day (QID) | RESPIRATORY_TRACT | 1 refills | Status: AC | PRN

## 2023-10-02 ENCOUNTER — Other Ambulatory Visit: Payer: Self-pay | Admitting: Internal Medicine

## 2023-10-02 ENCOUNTER — Other Ambulatory Visit: Payer: Self-pay | Admitting: General Surgery

## 2023-10-02 ENCOUNTER — Ambulatory Visit (HOSPITAL_COMMUNITY)
Admission: RE | Admit: 2023-10-02 | Discharge: 2023-10-02 | Disposition: A | Discharge status: Home / Self Care | Source: Ambulatory Visit | Attending: Interventional Radiology | Admitting: Interventional Radiology

## 2023-10-02 ENCOUNTER — Other Ambulatory Visit (HOSPITAL_COMMUNITY): Payer: Self-pay | Admitting: Interventional Radiology

## 2023-10-02 DIAGNOSIS — K819 Cholecystitis, unspecified: Secondary | ICD-10-CM

## 2023-10-02 DIAGNOSIS — J90 Pleural effusion, not elsewhere classified: Secondary | ICD-10-CM | POA: Diagnosis not present

## 2023-10-02 DIAGNOSIS — Z434 Encounter for attention to other artificial openings of digestive tract: Secondary | ICD-10-CM | POA: Diagnosis present

## 2023-10-02 DIAGNOSIS — R188 Other ascites: Secondary | ICD-10-CM

## 2023-10-02 DIAGNOSIS — J4489 Other specified chronic obstructive pulmonary disease: Secondary | ICD-10-CM

## 2023-10-02 HISTORY — PX: IR CV LINE INJECTION: IMG2294

## 2023-10-02 MED ORDER — ALBUTEROL SULFATE (2.5 MG/3ML) 0.083% IN NEBU: 2.5000 mg | INHALATION_SOLUTION | Freq: Four times a day (QID) | RESPIRATORY_TRACT | 1 refills | Status: AC | PRN

## 2023-10-02 MED ORDER — IOHEXOL 300 MG/ML  SOLN
50.0000 mL | Freq: Once | INTRAMUSCULAR | Status: AC | PRN
Start: 2023-10-02 — End: 2023-10-02
  Administered 2023-10-02: 20 mL

## 2023-10-02 NOTE — Progress Notes (Signed)
 IR progress note   Patient's family contacted IR today due to drain concerns. They reported evidence of drain leakage at the insertion site and  bloody output. Patient placed on the outpatient schedule for a drain evaluation today.at [facility] [place].  Patient was brought into the IR suite and underwent a drain injection with fluoroscopy. Dr. Lovell Rubenstein reviewed imaging and reported evidence of a perihepatic biloma with possible effusion. Patient, family and Dr. Lovell Rubenstein discussed findings and recommendations. It is recommended patient have a follow up CT scan.  Patient is scheduled for a follow up tomorrow morning at the outpatient clinic with Dr. Mabel Savage. This will also involve a CT scan.   [name] potentially need a separate perihepatic drain placed and a possible thoracentesis.

## 2023-10-03 ENCOUNTER — Ambulatory Visit (HOSPITAL_COMMUNITY)
Admission: RE | Admit: 2023-10-03 | Discharge: 2023-10-03 | Disposition: A | Discharge status: Home / Self Care | Source: Ambulatory Visit | Attending: Radiology | Admitting: Radiology

## 2023-10-03 ENCOUNTER — Other Ambulatory Visit (HOSPITAL_COMMUNITY): Payer: Self-pay | Admitting: Radiology

## 2023-10-03 ENCOUNTER — Other Ambulatory Visit (HOSPITAL_COMMUNITY): Payer: Self-pay | Admitting: Interventional Radiology

## 2023-10-03 ENCOUNTER — Ambulatory Visit
Admission: RE | Admit: 2023-10-03 | Discharge: 2023-10-03 | Disposition: A | Discharge status: Home / Self Care | Source: Ambulatory Visit | Attending: General Surgery | Admitting: General Surgery

## 2023-10-03 ENCOUNTER — Encounter: Payer: Self-pay | Admitting: Radiology

## 2023-10-03 DIAGNOSIS — R0602 Shortness of breath: Secondary | ICD-10-CM

## 2023-10-03 DIAGNOSIS — Z9049 Acquired absence of other specified parts of digestive tract: Secondary | ICD-10-CM

## 2023-10-03 DIAGNOSIS — R188 Other ascites: Secondary | ICD-10-CM

## 2023-10-03 DIAGNOSIS — K819 Cholecystitis, unspecified: Secondary | ICD-10-CM

## 2023-10-03 HISTORY — PX: IR THORACENTESIS ASP PLEURAL SPACE W/IMG GUIDE: IMG5380

## 2023-10-03 LAB — LACTATE DEHYDROGENASE, PLEURAL OR PERITONEAL FLUID: LD, Fluid: 339 U/L — ABNORMAL HIGH (ref 3–23)

## 2023-10-03 LAB — PROTEIN, PLEURAL OR PERITONEAL FLUID: Total protein, fluid: 3.8 g/dL

## 2023-10-03 MED ORDER — LIDOCAINE HCL 1 % IJ SOLN
INTRAMUSCULAR | Status: AC
  Filled 2023-10-03: qty 20

## 2023-10-03 MED ORDER — IOPAMIDOL (ISOVUE-300) INJECTION 61%
100.0000 mL | Freq: Once | INTRAVENOUS | Status: AC | PRN
  Administered 2023-10-03: 100 mL via INTRAVENOUS

## 2023-10-03 NOTE — Progress Notes (Signed)
 Chief Complaint: Patient was seen in consultation today for gallbladder fossa abscess.   at the request of [facility]  Referring Physician(s): [name]  Supervising Physician: Myrlene Asper  History of Present Illness: Joel Diodato Loose Sr. is a 78 y.o. male with history of a fib-on eliquis , hypertension, hyperlipidemia, type 2 diabetes mellitus and gallbladder fossa abscess post cholecystectomy 4/23, s/p drain placement by Dr. Nereida Banning 09/22/23. Patient was seen 5/20 at [facility] due to drain concerns. At this visit, a drain injection was performed and there was possible evidence of a fistula and fluid collection in the pleural cavity. Patient was set up with a follow up CT and visit today in the outpatient clinic. Patient reports drainage from the insertion site that is slightly pink in color. The drain is still able to flush without difficulty. Patient has noticed occasional chills and hot flashes. Endorses: pain at the drain site, and shortness of breath. Denies: vomiting, nausea, diarrhea.   Past Medical History:  Diagnosis Date   A-fib [facility])    AKI (acute kidney injury) (HCC) 05/18/2021   Anemia    Arthritis    "all over"   Bradycardia    Chronic bronchitis (HCC)    "get it just about q yr" (03/17/2014)   Complication of anesthesia    difficulty waking up   Daily headache    "here lately" (03/17/2014); relates to sinuses   GERD (gastroesophageal reflux disease)    Hard of hearing    hearing aides bilat   Hiatal hernia    History of blood transfusion 2008   "related to OR"   History of kidney stones    Hyperlipidemia    Hypertension    Pneumonia 1972 X 1   Rheumatoid arthritis (HCC)    Type II diabetes mellitus (HCC)    Wears glasses     Past Surgical History:  Procedure Laterality Date   ATRIAL FIBRILLATION ABLATION N/A 05/23/2023   Procedure: ATRIAL FIBRILLATION ABLATION;  Surgeon: [name], MD;  Location: [facility] INVASIVE CV LAB;  Service:  Cardiovascular;  Laterality: N/A;   broken finger      surgical repaired left hand 2nd finger    CARDIOVERSION N/A 11/08/2022   Procedure: CARDIOVERSION;  Surgeon: [name], MD;  Location: [facility] INVASIVE CV LAB;  Service: Cardiovascular;  Laterality: N/A;   cyst removed      per left knee/posteriorly   CYSTECTOMY Left 2009   "behind knee"   INGUINAL HERNIA REPAIR Right ?2010   IR CV LINE INJECTION  10/02/2023   JOINT REPLACEMENT     KNEE ARTHROSCOPY Bilateral 1980's   LIVER BIOPSY N/A 09/05/2023   Procedure: BIOPSY, LIVER;  Surgeon: [name], MD;  Location: [facility] ORS;  Service: General;  Laterality: N/A;   PARTIAL KNEE ARTHROPLASTY Right 08/27/2015   Procedure: RIGHT UNICOMPARTMENTAL KNEE ARTHROPLASTY;  Surgeon: [name], MD;  Location: [facility] ORS;  Service: Orthopedics;  Laterality: Right;   REVISION TOTAL KNEE ARTHROPLASTY Left 2008   SHOULDER SURGERY Left 2019   TOTAL KNEE ARTHROPLASTY Left 2003    Allergies: Codeine, Lipitor [atorvastatin], Invokana [canagliflozin], Losartan, Roxicodone  [oxycodone ], and Vicodin [hydrocodone -acetaminophen ]  Medications: Prior to Admission medications   Medication Sig Start Date End Date Taking? Authorizing Provider  Abatacept  (ORENCIA  IV) Inject 750 mg into the vein every 28 (twenty-eight) days.    [provider]  albuterol  (PROVENTIL ) (2.5 MG/3ML) 0.083% nebulizer solution Take 3 mLs (2.5 mg total) by nebulization every 6 (six) hours as needed for  wheezing or shortness of breath. 10/02/23   [name], MD  albuterol  (VENTOLIN  HFA) 108 (90 Base) MCG/ACT inhaler Inhale 2 puffs into the lungs every 6 (six) hours as needed for wheezing or shortness of breath. 10/01/23   [name], MD  amLODipine  (NORVASC ) 5 MG tablet Take 1 tablet (5 mg total) by mouth daily. 09/04/23   [name], MD  apixaban  (ELIQUIS ) 5 MG TABS tablet Take 1 tablet (5 mg total) by mouth 2 (two) times daily. 07/16/23   Lenise Quince, MD   ascorbic acid  (VITAMIN C ) 500 MG tablet Take 500 mg by mouth daily.    [provider]  calcium carbonate (OSCAL) 1500 (600 Ca) MG TABS tablet Take 600 mg of elemental calcium by mouth daily.    [provider]  cholecalciferol (VITAMIN D3) 25 MCG (1000 UNIT) tablet Take 1,000 Units by mouth daily.    [provider]  Insulin  Aspart, w/Niacinamide, (FIASP ) 100 UNIT/ML SOLN 100 Units. Insulin  pump 09/27/22   [provider]  insulin  glargine (LANTUS ) 100 UNIT/ML injection Inject 16 Units into the skin as needed (If pump goes out).    [provider]  Insulin  Human (INSULIN  PUMP) SOLN Inject into the skin continuous. Fiasp  77 units    [provider]  KRILL OIL PO Take 400 mg by mouth daily.    [provider]  loratadine  (CLARITIN ) 10 MG tablet Take 10 mg by mouth daily.    [provider]  magic mouthwash (nystatin , lidocaine , diphenhydrAMINE , alum & mag hydroxide) suspension Swish and swallow 5 mLs 3 (three) times daily as needed for mouth pain. 09/26/23   [name], MD  methocarbamol  (ROBAXIN ) 500 MG tablet Take 1 tablet (500 mg total) by mouth every 12 (twelve) hours as needed for muscle spasms. 09/28/23   [name], MD  Multiple Vitamin (MULTIVITAMIN WITH MINERALS) TABS Take 1 tablet by mouth daily. Centrum    [provider]  ondansetron  (ZOFRAN -ODT) 4 MG disintegrating tablet Take 1 tablet (4 mg total) by mouth every 8 (eight) hours as needed for nausea or vomiting. 09/09/23   Ninetta Basket, MD  ONE TOUCH ULTRA TEST test strip 1 each daily. Pt says 2-3 times daily 05/26/16   [provider]  pantoprazole  (PROTONIX ) 40 MG tablet TAKE 1 TABLET BY MOUTH DAILY 06/27/23   Mealor, Augustus E, MD  Potassium 99 MG TABS Take 99 mg by mouth daily.    [provider]  pravastatin  (PRAVACHOL ) 40 MG tablet Take 40 mg by mouth daily.    [provider]  pyridOXINE (VITAMIN B6) 50 MG tablet  Take 50 mg by mouth daily.    [provider]  ramipril  (ALTACE ) 5 MG capsule Take 1 capsule (5 mg total) by mouth daily. 09/04/23   [name], MD  sodium chloride  flush 0.9 % SOLN injection Use 1 flush as directed as needed 09/26/23 11/25/23  Carim, Charles A, PA-C  traMADol  (ULTRAM ) 50 MG tablet Take 1 tablet (50 mg total) by mouth every 6 (six) hours as needed for moderate pain (pain score 4-6). 09/25/23   [place], Rema Care, DO  vitamin E  400 UNIT capsule Take 400 Units by mouth in the morning.    [provider]  zinc gluconate 50 MG tablet Take 50 mg by mouth in the morning.    [provider]     Family History  Problem Relation Age of Onset   Cancer Father  prostate    Social History   Socioeconomic History   Marital status: Married    Spouse name: Nadean August   Number of children: 3   Years of education: College   Highest education level: Not on file  Occupational History    Employer: UNEMPLOYED  Tobacco Use   Smoking status: Former    Current packs/day: 0.00    Average packs/day: 1 pack/day for 5.0 years (5.0 ttl pk-yrs)    Types: Cigarettes, Cigars    Start date: 05/16/1963    Quit date: 05/15/1968    Years since quitting: 55.4    Passive exposure: Never   Smokeless tobacco: Never   Tobacco comments:    Former smoker 06/20/23  Vaping Use   Vaping status: Never Used  Substance and Sexual Activity   Alcohol use: No    Alcohol/week: 0.0 standard drinks of alcohol   Drug use: Never   Sexual activity: Yes  Other Topics Concern   Not on file  Social History Narrative   Patient lives at home with family.   Caffeine Use: occasionally   Social Drivers of Corporate investment banker Strain: Not on file  Food Insecurity: No Food Insecurity (09/27/2023)   Hunger Vital Sign    Worried About Running Out of Food in the Last Year: Never true    Ran Out of Food in the Last Year: Never true  Transportation Needs: No Transportation Needs  (09/27/2023)   PRAPARE - Administrator, Civil Service (Medical): No    Lack of Transportation (Non-Medical): No  Physical Activity: Not on file  Stress: Not on file  Social Connections: Unknown (09/21/2023)   Social Connection and Isolation Panel [NHANES]    Frequency of Communication with Friends and Family: Twice a week    Frequency of Social Gatherings with Friends and Family: More than three times a week    Attends Religious Services: Never    Database administrator or Organizations: No    Attends Banker Meetings: Never    Marital Status: Patient declined     Review of Systems: A 12 point ROS discussed and pertinent positives are indicated in the HPI above.  All other systems are negative.  Review of Systems  Constitutional:  Positive for chills and diaphoresis. Negative for fever.  Respiratory:  Negative for shortness of breath.   Cardiovascular:  Negative for chest pain.  Gastrointestinal:  Positive for abdominal pain (RUQ, at drain site.). Negative for diarrhea, nausea and vomiting.    Vital Signs: There were no vitals taken for this visit.  Physical Exam HENT:     Head: Normocephalic and atraumatic.  Cardiovascular:     Rate and Rhythm: Normal rate and regular rhythm.  Pulmonary:     Comments: Decreased breath sounds right lower lung fields.  Musculoskeletal:     Cervical back: Normal range of motion.  Skin:    General: Skin is warm.     Comments: RUQ drain in place with serous drainage from insertion site. No surrounding erythema or edema.   Neurological:     General: No focal deficit present.     Mental Status: He is alert and oriented to person, place, and time.  Psychiatric:        Mood and Affect: Mood normal.        Thought Content: Thought content normal.        Judgment: Judgment normal.     Mallampati Score:     Imaging:  IR INJECT INDWELLING DRAINAGE CATHETER Result Date: 10/02/2023 INDICATION: Gallbladder fossa abscess  following cholecystectomy status post percutaneous drain 09/22/2023 leakage at the drain skin site. EXAM: FLUOROSCOPIC INJECTION OF THE GALLBLADDER FOSSA ABSCESS DRAIN MEDICATIONS: None. ANESTHESIA/SEDATION: None. COMPLICATIONS: None immediate. PROCEDURE: Informed written consent was obtained from the patient after a thorough discussion of the procedural risks, benefits and alternatives. All questions were addressed. Maximal Sterile Barrier Technique was utilized including caps, mask, sterile gowns, sterile gloves, sterile drape, hand hygiene and skin antiseptic. A timeout was performed prior to the initiation of the procedure. Under sterile conditions, the existing right upper quadrant gallbladder fossa abscess drain was injected with contrast. Fluoroscopic imaging performed. There is fistula communication from the collapsed abscess cavity to an adjacent right hepatic duct. Contrast opacifies the biliary tree, biliary confluence, common hepatic duct and common bile duct. Biliary tree is patent with contrast opacifying the duodenum. There is reflux of contrast into the residual cystic duct. Contrast also leaks along the catheter tract into the perihepatic space suggesting an underlying perihepatic fluid collection/biloma. Included fluoroscopic imaging of the right chest demonstrates a moderate size right pleural effusion as well. IMPRESSION: Collapse gallbladder fossa abscess cavity has a fistula communication to a small right hepatic duct. Opacified biliary tree is patent with contrast draining into the duodenum. Perihepatic leakage of contrast during the injection suggest perihepatic fluid collection/biloma. Recommend further assessment with abdomen pelvis CT with contrast. Moderate-sized right pleural effusion. This Scholler warrant right thoracentesis. Electronically Signed   By: Melven Stable.  Shick M.D.   On: 10/02/2023 16:37   DG Chest 1 View Result Date: 09/24/2023 CLINICAL DATA:  Pain. EXAM: CHEST  1 VIEW COMPARISON:   05/28/2023. FINDINGS: Low lung volumes. The heart size and mediastinal contours are within normal limits. Aortic atherosclerosis. Mild bibasilar linear and subsegmental atelectasis. No sizable pleural effusion or pneumothorax. No acute osseous abnormality. Percutaneous drainage catheter overlies the right upper quadrant. IMPRESSION: Low lung volumes with mild bibasilar linear and subsegmental atelectasis. Electronically Signed   By: Mannie Seek M.D.   On: 09/24/2023 16:23   DG Shoulder Right Result Date: 09/24/2023 CLINICAL DATA:  Right shoulder pain. EXAM: RIGHT SHOULDER - 2+ VIEW COMPARISON:  None Available. FINDINGS: There is no evidence of acute fracture or dislocation. Mild degenerative changes of the glenohumeral joint. Mild degenerative changes of the acromioclavicular joint with intra-articular body. Soft tissues are grossly unremarkable. IMPRESSION: 1. No acute osseous abnormality. 2. Mild degenerative changes of the right glenohumeral and acromioclavicular joints. Electronically Signed   By: Mannie Seek M.D.   On: 09/24/2023 16:18   DG Cervical Spine 2 or 3 views Result Date: 09/24/2023 CLINICAL DATA:  Neck pain. EXAM: CERVICAL SPINE - 2-3 VIEW COMPARISON:  CT cervical spine dated 08/20/2021. FINDINGS: There is no evidence of acute cervical spine fracture or prevertebral soft tissue swelling. Multilevel degenerative disc height loss and endplate osteophytosis most pronounced at C4-C7 with similar mild anterolisthesis of C7 on T1. IMPRESSION: 1. No acute osseous abnormality. 2. Multilevel degenerative changes of the cervical spine, as above. Electronically Signed   By: Mannie Seek M.D.   On: 09/24/2023 16:16   CT GUIDED PERITONEAL/RETROPERITONEAL FLUID DRAIN BY PERC CATH Result Date: 09/22/2023 CLINICAL DATA:  Gallbladder fossa abscess. Status post cholecystectomy on 09/05/2023. EXAM: CT GUIDED CATHETER DRAINAGE OF PERITONEAL ABSCESS ANESTHESIA/SEDATION: Moderate (conscious) sedation  was employed during this procedure. A total of Versed  4.0 mg and Fentanyl  200 mcg was administered intravenously. Moderate Sedation Time: 30 minutes. The patient's level of  consciousness and vital signs were monitored continuously by radiology nursing throughout the procedure under my direct supervision. PROCEDURE: The procedure, risks, benefits, and alternatives were explained to the patient. Questions regarding the procedure were encouraged and answered. The patient understands and consents to the procedure. A time out was performed prior to initiating the procedure. The abdominal wall was prepped with chlorhexidine  in a sterile fashion, and a sterile drape was applied covering the operative field. A sterile gown and sterile gloves were used for the procedure. Local anesthesia was provided with 1% Lidocaine . CT was performed in a supine position. From a right anterolateral approach, an 18 gauge trocar needle was advanced into a gallbladder fossa abscess. After return of fluid, a guidewire was advanced into the collection, the tract dilated and a 10 French percutaneous drain advanced over the wire. The drain was formed and additional 20 mL fluid sample withdrawn for culture analysis. The drain was flushed with sterile saline and attached to a suction bulb. It was secured at the skin with a Prolene retention suture and adhesive StatLock device. RADIATION DOSE REDUCTION: This exam was performed according to the departmental dose-optimization program which includes automated exposure control, adjustment of the mA and/or kV according to patient size and/or use of iterative reconstruction technique. COMPLICATIONS: None FINDINGS: The gallbladder fossa abscess containing fluid and air yielded grossly purulent fluid. After drain placement there is good return of purulent fluid. A fluid sample was sent for culture analysis. There was a limited window for approach to the second probable complex fluid collection abutting the  proximal duodenum. After drain placement and partial decompression of the gallbladder fossa abscess, it appeared that the adjacent collection Mcdaniel be slightly smaller and therefore it was chosen to not perform a second aspiration/drainage procedure at this time and watch this collection. IMPRESSION: CT-guided percutaneous catheter drainage of gallbladder fossa abscess yielding purulent fluid. A 10 French drain was placed and attached to suction bulb drainage. A sample of purulent fluid was sent for culture analysis. A second adjacent fluid collection near the proximal duodenum appeared potentially slightly smaller after partial decompression of the gallbladder fossa abscess so a second aspiration drainage procedure was not performed at this time. Electronically Signed   By: Erica Hau M.D.   On: 09/22/2023 12:59   DG Abd Portable 1 View Result Date: 09/21/2023 CLINICAL DATA:  NG tube placement. EXAM: PORTABLE ABDOMEN - 1 VIEW COMPARISON:  None Available. FINDINGS: A nasogastric tube is seen with its distal tip overlying the expected region of the body of the stomach. Its distal side hole sits approximately 6.5 cm distal to the expected region of the gastroesophageal junction. The bowel gas pattern is normal. No radio-opaque calculi or other significant radiographic abnormality are seen. IMPRESSION: Nasogastric tube positioning, as described above. Electronically Signed   By: Virgle Grime M.D.   On: 09/21/2023 18:47   NM HEPATOBILIARY LEAK (POST-SURGICAL) Result Date: 09/21/2023 CLINICAL DATA:  Acute abdominal pain, rule out bile leak. Cholecystectomy 09/05/2023 EXAM: NUCLEAR MEDICINE HEPATOBILIARY IMAGING TECHNIQUE: Sequential images of the abdomen were obtained out to 60 minutes following intravenous administration of radiopharmaceutical. RADIOPHARMACEUTICALS:  Five mCi Tc-89m  Choletec  IV COMPARISON:  CT abdomen 09/21/2023 FINDINGS: Satisfactory uptake of radiopharmaceutical from the blood pool.  Biliary activity at 9 minutes and bowel activity at 11 minutes. We carried out imaging for 2 full hours. No abnormal extension of radiopharmaceutical to suggest bile leak. IMPRESSION: 1. No bile leak is demonstrated. We carried out observation for 2 full hours.  2. Patent common bile duct. Electronically Signed   By: Freida Jes M.D.   On: 09/21/2023 16:41   CT ABDOMEN PELVIS W CONTRAST Result Date: 09/21/2023 CLINICAL DATA:  Acute generalized abdominal pain. EXAM: CT ABDOMEN AND PELVIS WITH CONTRAST TECHNIQUE: Multidetector CT imaging of the abdomen and pelvis was performed using the standard protocol following bolus administration of intravenous contrast. RADIATION DOSE REDUCTION: This exam was performed according to the departmental dose-optimization program which includes automated exposure control, adjustment of the mA and/or kV according to patient size and/or use of iterative reconstruction technique. CONTRAST:  75mL OMNIPAQUE  IOHEXOL  350 MG/ML SOLN COMPARISON:  September 09, 2023. FINDINGS: Lower chest: No acute abnormality. Hepatobiliary: Status post cholecystectomy. 7.3 x 5.9 cm fluid collection is noted in the gallbladder fossa which is enlarged compared to prior exam which contains air-fluid level and is highly concerning for abscess or possibly biloma. Surrounding inflammatory changes are noted. Pancreas: Unremarkable. No pancreatic ductal dilatation or surrounding inflammatory changes. Spleen: Normal in size without focal abnormality. Adrenals/Urinary Tract: Adrenal glands appear normal. Bilateral renal cysts are noted. No hydronephrosis or renal obstruction is noted. Urinary bladder is unremarkable. Stomach/Bowel: The appendix is unremarkable. Sigmoid diverticulosis is noted without inflammation. Mild to moderate gastric distention is noted concerning for gastric outlet obstruction. This appears to be due to significant wall thickening and inflammatory changes involving the gastric antrum and  proximal duodenum, most secondary to adjacent inflammation and abscess in gallbladder fossa. 6.3 x 3.9 cm fluid-filled structure is seen in this area which Donlan represent abscess as well, or potentially be inflamed proximal duodenum. Vascular/Lymphatic: Aortic atherosclerosis. No enlarged abdominal or pelvic lymph nodes. Reproductive: Prostate is unremarkable. Other: No hernia or ascites is noted. Musculoskeletal: No acute or significant osseous findings. IMPRESSION: Status post cholecystectomy. 7.3 x 5.9 cm fluid collection with air-fluid levels is noted in gallbladder fossa which is enlarged compared to prior exam of September 09, 2023 and is most consistent with abscess or possibly biloma. There is also noted 6.3 x 3.9 cm fluid-filled structure medial to this; it is uncertain if this abnormality represents abscess, or severely inflamed gastric antrum and proximal duodenum. Mild to moderate gastric distention is noted suggesting gastric outlet obstruction. The second fluid collection does appear to extend to the subcapsular region inferiorly of right hepatic lobe. Sigmoid diverticulosis without inflammation. Aortic Atherosclerosis (ICD10-I70.0). Electronically Signed   By: Rosalene Colon M.D.   On: 09/21/2023 11:24   CT ABDOMEN PELVIS W CONTRAST Result Date: 09/09/2023 CLINICAL DATA:  Status post cholecystectomy on 09/05/2023. Patient now complains of right upper quadrant pain. EXAM: CT ABDOMEN AND PELVIS WITH CONTRAST TECHNIQUE: Multidetector CT imaging of the abdomen and pelvis was performed using the standard protocol following bolus administration of intravenous contrast. RADIATION DOSE REDUCTION: This exam was performed according to the departmental dose-optimization program which includes automated exposure control, adjustment of the mA and/or kV according to patient size and/or use of iterative reconstruction technique. CONTRAST:  OMNIPAQUE  IOHEXOL  300 MG/ML  SOLN COMPARISON:  MRI 08/20/2023. FINDINGS:  Lower chest: Atelectatic changes identified within both lung bases with posterior pleural thickening. No significant pleural effusion or consolidative changes. Hepatobiliary: No suspicious liver lesion. Postop change from cholecystectomy. Fluid collection with air-fluid level is identified within the gallbladder fossa measuring 6.1 x 3.2 by 2.9 cm. There is surrounding soft tissue stranding which wraps around the proximal descending duodenum. Small volume of free fluid extends along the inferior margin and lateral margin of the right lobe and along  the right pericolic gutter. The common bile duct measures 7 mm in maximum dimension. No calcified common bile duct stones. Pancreas: No main duct dilatation, inflammation or mass. Spleen: Normal in size without focal abnormality. Adrenals/Urinary Tract: Normal adrenal glands. Bilateral simple appearing cysts are again noted. The largest arises off the lateral left kidney measuring 4.6 cm, image 40/2. No hydronephrosis or nephrolithiasis. Urinary bladder is unremarkable. Stomach/Bowel: Stomach appears distended. There is mild wall thickening involving the proximal descending duodenum with surrounding soft tissue stranding. No pathologic dilatation of the large or small bowel loops. The appendix is visualized and appears normal. Mild stool burden within the colon. Severe sigmoid diverticulosis with signs of chronic diverticular disease. No signs of acute inflammatory change about the sigmoid colon. Vascular/Lymphatic: Aortic atherosclerosis without aneurysm. The upper abdominal vascularity is patent. No enlarged upper abdominal lymph nodes. Reproductive: Prostate gland enlargement.  No mass noted. Other: Small volume of fluid is noted along the proximal right pericolic gutter extending from the inferior margin of the right lobe. Within the right posterior pelvis there is a small amount of free fluid measuring 14.6 Hounsfield units. Musculoskeletal: No acute or suspicious  osseous findings. Chronic bilateral L5 pars defects. Multilevel degenerative disc disease and facet arthropathy within the lumbar spine. IMPRESSION: 1. Postop change from cholecystectomy. Fluid collection with air-fluid level is identified within the gallbladder fossa measuring 6.1 x 3.2 x 2.9 cm. There is surrounding soft tissue stranding which wraps around the proximal descending duodenum. Differential considerations include postoperative seroma, hematoma or abscess. If there is a clinical concern for bile leak consider further evaluation with nuclear medicine hepatic biliary scan. 2. Small volume of free fluid extends along the inferior margin and lateral margin of the right lobe and along the right pericolic gutter. Trace fluid noted within the right posterior pelvis. 3. Severe sigmoid diverticulosis with signs of chronic diverticular disease. No signs of acute inflammatory change about the sigmoid colon. 4. Prostate gland enlargement. 5. Chronic bilateral L5 pars defects. 6.  Aortic Atherosclerosis (ICD10-I70.0). Electronically Signed   By: Kimberley Penman M.D.   On: 09/09/2023 10:06    Labs:  CBC: Recent Labs    09/21/23 0734 09/21/23 0739 09/22/23 0359 09/23/23 0547 09/24/23 0504  WBC 10.6*  --  9.4 9.9 7.8  HGB 12.2* 13.3 11.7* 11.4* 10.8*  HCT 36.9* 39.0 35.1* 33.5* 32.5*  PLT 626*  --  584* 596* 528*    COAGS: Recent Labs    05/28/23 1737 06/01/23 1343 07/30/23 0850 08/13/23 0852 08/17/23 1413 09/22/23 0359  INR 2.8*   < > 2.3 2.3 1.1 1.2  APTT 50*  --   --   --   --   --    < > = values in this interval not displayed.    BMP: Recent Labs    09/22/23 0359 09/23/23 0547 09/24/23 0504 09/25/23 0555  NA 135 137 134* 136  K 3.7 3.5 3.6 3.1*  CL 97* 98 97* 101  CO2 27 29 29 29   GLUCOSE 92 67* 98 93  BUN 18 14 11 12   CALCIUM 8.3* 8.6* 8.1* 8.2*  CREATININE 1.18 1.08 1.00 1.05  GFRNONAA >60 >60 >60 >60    LIVER FUNCTION TESTS: Recent Labs    08/17/23 1413  09/03/23 0807 09/09/23 0907 09/21/23 0734  BILITOT 1.6* 0.9 1.4* 1.0  AST 453* 28 51* 34  ALT 503* 32 51* 20  ALKPHOS 468* 149* 157* 99  PROT 6.3 6.3* 7.3 6.7  ALBUMIN 4.5 3.7  4.2 2.9*    TUMOR MARKERS: No results for input(s): "AFPTM", "CEA", "CA199", "CHROMGRNA" in the last 8760 hours.  Assessment: Gallbladder fossa abscess  CT abdomen/pelvis 10/03/23 reviewed by Dr. Wilnette Haste. Drain to remain in place.  Evidence of pleural effusion on right with concern for bile leak into the pleural cavity. Referral for thoracentesis with labs.  Referral to GI for possible upper endoscopy. Follow up in 1 month Contact IR with questions/concerns.   Electronically Signed: Lawrance Presume PA-C 10/03/2023, 12:11 PM   Please refer to Dr. Wilnette Haste attestation of this note for management and plan.

## 2023-10-03 NOTE — Procedures (Signed)
 PROCEDURE SUMMARY:  Successful US  guided right thoracentesis. Yielded 1.1L of dark red, cloudy fluid. Patient tolerated procedure well. No immediate complications. EBL = trace  Specimen was sent for labs.  Post procedure chest X-ray reveals no pneumothorax  Pasty Bongo PA-C 10/03/2023 2:37 PM

## 2023-10-04 ENCOUNTER — Encounter: Admitting: General Surgery

## 2023-10-04 ENCOUNTER — Other Ambulatory Visit (HOSPITAL_COMMUNITY): Payer: Self-pay | Admitting: Student

## 2023-10-04 ENCOUNTER — Other Ambulatory Visit (HOSPITAL_COMMUNITY): Payer: Self-pay | Admitting: Interventional Radiology

## 2023-10-04 DIAGNOSIS — R188 Other ascites: Secondary | ICD-10-CM

## 2023-10-05 ENCOUNTER — Ambulatory Visit (HOSPITAL_COMMUNITY)
Admission: RE | Admit: 2023-10-05 | Discharge: 2023-10-05 | Disposition: A | Discharge status: Home / Self Care | Source: Ambulatory Visit | Attending: Interventional Radiology | Admitting: Interventional Radiology

## 2023-10-05 ENCOUNTER — Other Ambulatory Visit: Payer: Self-pay | Admitting: Physician Assistant

## 2023-10-05 ENCOUNTER — Other Ambulatory Visit (HOSPITAL_COMMUNITY): Payer: Self-pay | Admitting: Interventional Radiology

## 2023-10-05 ENCOUNTER — Emergency Department (HOSPITAL_COMMUNITY)

## 2023-10-05 ENCOUNTER — Encounter (HOSPITAL_COMMUNITY): Payer: Self-pay

## 2023-10-05 ENCOUNTER — Emergency Department (HOSPITAL_COMMUNITY): Admission: EM | Admit: 2023-10-05 | Discharge: 2023-10-06 | Disposition: A | Discharge status: Home / Self Care

## 2023-10-05 ENCOUNTER — Other Ambulatory Visit: Payer: Self-pay

## 2023-10-05 ENCOUNTER — Telehealth (INDEPENDENT_AMBULATORY_CARE_PROVIDER_SITE_OTHER): Admitting: General Surgery

## 2023-10-05 VITALS — BP 145/54 | HR 79 | Resp 21

## 2023-10-05 DIAGNOSIS — Z7901 Long term (current) use of anticoagulants: Secondary | ICD-10-CM | POA: Insufficient documentation

## 2023-10-05 DIAGNOSIS — T85520D Displacement of bile duct prosthesis, subsequent encounter: Secondary | ICD-10-CM

## 2023-10-05 DIAGNOSIS — I10 Essential (primary) hypertension: Secondary | ICD-10-CM | POA: Insufficient documentation

## 2023-10-05 DIAGNOSIS — Z87891 Personal history of nicotine dependence: Secondary | ICD-10-CM | POA: Insufficient documentation

## 2023-10-05 DIAGNOSIS — R739 Hyperglycemia, unspecified: Secondary | ICD-10-CM | POA: Insufficient documentation

## 2023-10-05 DIAGNOSIS — J90 Pleural effusion, not elsewhere classified: Secondary | ICD-10-CM

## 2023-10-05 DIAGNOSIS — R109 Unspecified abdominal pain: Secondary | ICD-10-CM | POA: Diagnosis not present

## 2023-10-05 DIAGNOSIS — I4891 Unspecified atrial fibrillation: Secondary | ICD-10-CM | POA: Insufficient documentation

## 2023-10-05 DIAGNOSIS — R079 Chest pain, unspecified: Secondary | ICD-10-CM | POA: Insufficient documentation

## 2023-10-05 DIAGNOSIS — Z9049 Acquired absence of other specified parts of digestive tract: Secondary | ICD-10-CM | POA: Insufficient documentation

## 2023-10-05 DIAGNOSIS — Y838 Other surgical procedures as the cause of abnormal reaction of the patient, or of later complication, without mention of misadventure at the time of the procedure: Secondary | ICD-10-CM | POA: Diagnosis not present

## 2023-10-05 DIAGNOSIS — T85520A Displacement of bile duct prosthesis, initial encounter: Secondary | ICD-10-CM | POA: Insufficient documentation

## 2023-10-05 DIAGNOSIS — D72829 Elevated white blood cell count, unspecified: Secondary | ICD-10-CM | POA: Insufficient documentation

## 2023-10-05 DIAGNOSIS — R188 Other ascites: Secondary | ICD-10-CM

## 2023-10-05 DIAGNOSIS — E785 Hyperlipidemia, unspecified: Secondary | ICD-10-CM | POA: Diagnosis not present

## 2023-10-05 DIAGNOSIS — Z794 Long term (current) use of insulin: Secondary | ICD-10-CM | POA: Insufficient documentation

## 2023-10-05 DIAGNOSIS — Z09 Encounter for follow-up examination after completed treatment for conditions other than malignant neoplasm: Secondary | ICD-10-CM

## 2023-10-05 DIAGNOSIS — E119 Type 2 diabetes mellitus without complications: Secondary | ICD-10-CM | POA: Insufficient documentation

## 2023-10-05 LAB — URINALYSIS, ROUTINE W REFLEX MICROSCOPIC
Bacteria, UA: NONE SEEN
Bilirubin Urine: NEGATIVE
Glucose, UA: >=500 mg/dL — AB
Hgb urine dipstick: NEGATIVE
Ketones, ur: 20 mg/dL — AB
Leukocytes,Ua: NEGATIVE
Nitrite: NEGATIVE
Protein, ur: NEGATIVE mg/dL
Specific Gravity, Urine: 1.03 (ref 1.005–1.030)
pH: 5 (ref 5.0–8.0)

## 2023-10-05 LAB — COMPREHENSIVE METABOLIC PANEL WITH GFR
ALT: 32 U/L (ref 0–44)
AST: 44 U/L — ABNORMAL HIGH (ref 15–41)
Albumin: 2.5 g/dL — ABNORMAL LOW (ref 3.5–5.0)
Alkaline Phosphatase: 94 U/L (ref 38–126)
Anion gap: 13 (ref 5–15)
BUN: 16 mg/dL (ref 8–23)
CO2: 22 mmol/L (ref 22–32)
Calcium: 8.3 mg/dL — ABNORMAL LOW (ref 8.9–10.3)
Chloride: 92 mmol/L — ABNORMAL LOW (ref 98–111)
Creatinine, Ser: 1.22 mg/dL (ref 0.61–1.24)
GFR, Estimated: >60 mL/min (ref >60)
Glucose, Bld: 512 mg/dL (ref 70–99)
Potassium: 5.1 mmol/L (ref 3.5–5.1)
Sodium: 127 mmol/L — ABNORMAL LOW (ref 135–145)
Total Bilirubin: 0.8 mg/dL (ref 0.0–1.2)
Total Protein: 6.2 g/dL — ABNORMAL LOW (ref 6.5–8.1)

## 2023-10-05 LAB — CBC
HCT: 35.7 % — ABNORMAL LOW (ref 39.0–52.0)
Hemoglobin: 12.4 g/dL — ABNORMAL LOW (ref 13.0–17.0)
MCH: 30.1 pg (ref 26.0–34.0)
MCHC: 34.7 g/dL (ref 30.0–36.0)
MCV: 86.7 fL (ref 80.0–100.0)
Platelets: 473 10*3/uL — ABNORMAL HIGH (ref 150–400)
RBC: 4.12 MIL/uL — ABNORMAL LOW (ref 4.22–5.81)
RDW: 12.7 % (ref 11.5–15.5)
WBC: 9.4 10*3/uL (ref 4.0–10.5)
nRBC: 0 % (ref 0.0–0.2)

## 2023-10-05 LAB — CBC WITH DIFFERENTIAL/PLATELET
Abs Immature Granulocytes: 0.08 10*3/uL — ABNORMAL HIGH (ref 0.00–0.07)
Basophils Absolute: 0.1 10*3/uL (ref 0.0–0.1)
Basophils Relative: 1 %
Eosinophils Absolute: 0 10*3/uL (ref 0.0–0.5)
Eosinophils Relative: 0 %
HCT: 36.9 % — ABNORMAL LOW (ref 39.0–52.0)
Hemoglobin: 12.3 g/dL — ABNORMAL LOW (ref 13.0–17.0)
Immature Granulocytes: 1 %
Lymphocytes Relative: 11 %
Lymphs Abs: 1.2 10*3/uL (ref 0.7–4.0)
MCH: 29.2 pg (ref 26.0–34.0)
MCHC: 33.3 g/dL (ref 30.0–36.0)
MCV: 87.6 fL (ref 80.0–100.0)
Monocytes Absolute: 0.9 10*3/uL (ref 0.1–1.0)
Monocytes Relative: 8 %
Neutro Abs: 8.4 10*3/uL — ABNORMAL HIGH (ref 1.7–7.7)
Neutrophils Relative %: 79 %
Platelets: 450 10*3/uL — ABNORMAL HIGH (ref 150–400)
RBC: 4.21 MIL/uL — ABNORMAL LOW (ref 4.22–5.81)
RDW: 12.9 % (ref 11.5–15.5)
WBC: 10.7 10*3/uL — ABNORMAL HIGH (ref 4.0–10.5)
nRBC: 0 % (ref 0.0–0.2)

## 2023-10-05 LAB — CBG MONITORING, ED
Glucose-Capillary: 489 mg/dL — ABNORMAL HIGH (ref 70–99)
Glucose-Capillary: 411 mg/dL — ABNORMAL HIGH (ref 70–99)

## 2023-10-05 LAB — I-STAT CG4 LACTIC ACID, ED
Lactic Acid, Venous: 1.6 mmol/L (ref 0.5–1.9)
Lactic Acid, Venous: 1.8 mmol/L (ref 0.5–1.9)

## 2023-10-05 LAB — PROTIME-INR
INR: 1.2 (ref 0.8–1.2)
Prothrombin Time: 15.1 s (ref 11.4–15.2)

## 2023-10-05 LAB — LIPASE, BLOOD: Lipase: 25 U/L (ref 11–51)

## 2023-10-05 MED ORDER — SODIUM CHLORIDE 0.9 % IV SOLN
INTRAVENOUS | Status: AC | PRN
  Administered 2023-10-05: 2 g via INTRAVENOUS

## 2023-10-05 MED ORDER — MIDAZOLAM HCL 2 MG/2ML IJ SOLN
INTRAMUSCULAR | Status: AC | PRN
  Administered 2023-10-05: 1 mg via INTRAVENOUS
  Administered 2023-10-05: .5 mg via INTRAVENOUS
  Administered 2023-10-05: 1 mg via INTRAVENOUS

## 2023-10-05 MED ORDER — NALOXONE HCL 2 MG/2ML IJ SOSY
PREFILLED_SYRINGE | INTRAMUSCULAR | Status: AC
  Filled 2023-10-05: qty 2

## 2023-10-05 MED ORDER — HYDROMORPHONE HCL 2 MG PO TABS: 1.0000 mg | ORAL_TABLET | Freq: Four times a day (QID) | ORAL | 0 refills | Status: DC | PRN

## 2023-10-05 MED ORDER — FENTANYL CITRATE (PF) 100 MCG/2ML IJ SOLN
INTRAMUSCULAR | Status: AC | PRN
  Administered 2023-10-05 (×3): 25 ug via INTRAVENOUS

## 2023-10-05 MED ORDER — FENTANYL CITRATE (PF) 100 MCG/2ML IJ SOLN
INTRAMUSCULAR | Status: AC
  Filled 2023-10-05: qty 2

## 2023-10-05 MED ORDER — IOHEXOL 350 MG/ML SOLN
75.0000 mL | Freq: Once | INTRAVENOUS | Status: AC | PRN
  Administered 2023-10-05: 75 mL via INTRAVENOUS

## 2023-10-05 MED ORDER — SODIUM CHLORIDE 0.9 % IV BOLUS
1000.0000 mL | Freq: Once | INTRAVENOUS | Status: AC
  Administered 2023-10-05: 1000 mL via INTRAVENOUS

## 2023-10-05 MED ORDER — FLUMAZENIL 0.5 MG/5ML IV SOLN
INTRAVENOUS | Status: AC
  Filled 2023-10-05: qty 5

## 2023-10-05 MED ORDER — SODIUM CHLORIDE 0.9 % IV SOLN
INTRAVENOUS | Status: AC
  Filled 2023-10-05: qty 2

## 2023-10-05 MED ORDER — MIDAZOLAM HCL 2 MG/2ML IJ SOLN
INTRAMUSCULAR | Status: AC
  Filled 2023-10-05: qty 2

## 2023-10-05 MED ORDER — LIDOCAINE 5 % EX PTCH: 1.0000 | MEDICATED_PATCH | Freq: Once | CUTANEOUS | Status: DC

## 2023-10-05 MED ORDER — ACETAMINOPHEN 500 MG PO TABS: 1000.0000 mg | ORAL_TABLET | Freq: Once | ORAL | Status: DC

## 2023-10-05 MED ORDER — LIDOCAINE-EPINEPHRINE 1 %-1:100000 IJ SOLN
30.0000 mL | Freq: Once | INTRAMUSCULAR | Status: AC
  Administered 2023-10-05: 30 mL

## 2023-10-05 MED ORDER — INSULIN ASPART 100 UNIT/ML IJ SOLN
10.0000 [IU] | Freq: Once | INTRAMUSCULAR | Status: AC
  Administered 2023-10-05: 10 [IU] via SUBCUTANEOUS

## 2023-10-05 MED ORDER — FENTANYL CITRATE PF 50 MCG/ML IJ SOSY
50.0000 ug | PREFILLED_SYRINGE | Freq: Once | INTRAMUSCULAR | Status: AC
  Administered 2023-10-05: 50 ug via INTRAMUSCULAR
  Filled 2023-10-05: qty 1

## 2023-10-05 NOTE — Procedures (Signed)
 Interventional Radiology Procedure Note  Procedure: Image guided drain placement, GB fossa.  88F pigtail drain.  Previous drain removed.   Complications: None  Findings: New drain confirmed in appropriate space with CT, and injection of saline through the new drain exits the skin site of prior, proving communication. Old drain removed.    Recommendations: - Routine drain care,   record output - 1 hr dc home - routine wound care  Signed,  Marciano Settles. Mabel Savage, DO

## 2023-10-05 NOTE — ED Provider Triage Note (Signed)
 Emergency Medicine Provider Triage Evaluation Note  Joel Jaso Flynt Sr. , a 78 y.o. male  was evaluated in triage.  Pt complains of for concerns of leakage from drain placement.  Patient had a drain placed today at IR however over the past 2 days had increasing pain is only on nominal at this time due to allergies.  Patient's had pain not controlled by the tramadol  along with abdominal pain and back pain from the tubes have been placed.  Patient recently had chest tube placed and then had pleural effusions as it was placed incorrectly and then had it replaced.  Family was concerned that the tube was draining pus and went to IR in which they replaced the tube.  Patient family denies fevers.  Review of Systems  Positive:  Negative:   Physical Exam  BP (!) 141/74 (BP Location: Right Arm)   Pulse 89   Temp 98.4 F (36.9 C)   Resp 20   SpO2 94%  Gen:   Awake, ill-appearing Resp:  Normal effort  MSK:   Moves extremities without difficulty  Other:  Pigtail noted in right side  Medical Decision Making  Medically screening exam initiated at 7:25 PM.  Appropriate orders placed.  Joel Patches Fugett Sr. was informed that the remainder of the evaluation will be completed by another provider, this initial triage assessment does not replace that evaluation, and the importance of remaining in the ED until their evaluation is complete.  Workup initiated, IR called and stated they would like the patient to have pain management and a CT chest abdomen pelvis with contrast and so this has been ordered, patient stable.   Denese Finn, [place] 10/05/23 1928

## 2023-10-05 NOTE — Telephone Encounter (Signed)
 Called patient's phone and daughter answered.  He was in the middle of the procedure at [facility].  She said that he had undergone a thoracentesis given the recent CT findings on 10/03/2023.  She stated there has been minimal drainage from the pigtail catheter.  As they were awaiting for him to complete the procedure, I told them should they have any questions, they could call me.

## 2023-10-05 NOTE — ED Notes (Signed)
 Patient to CT.

## 2023-10-05 NOTE — ED Triage Notes (Signed)
 Patient reports right back pain today , family also concerned about decreased drainage at abdominal tube , denies fever .

## 2023-10-05 NOTE — H&P (Signed)
 Chief Complaint: Bile Leak  Referring Provider(s): [name]  Supervising Physician: Myrlene Asper  Patient Status: [facility] - Out-pt  History of Present Illness: Joel Easterly Jetter Sr. is a 78 y.o. male with A fib-on Eliquis , hypertension, hyperlipidemia, type 2 diabetes mellitus.  He is status post cholecystectomy 4/23 by Dr. [name].  He then presented to the ED on 09/21/23 w/ N/V, abdominal pain, weakness and fatigue.  CT scan showed a GB fossa fluid collection.  He underwent drain placement by Dr. Nereida Banning 09/22/23.   He was seen 5/20 at [facility] due to drain concerns.   At this visit, a drain injection was performed and there was possible evidence of a fistula and fluid collection in the pleural cavity.   Patient was set up with a follow up CT the next day in the outpatient clinic.    CT showed right pleural effusion, and decompressed GB fossa collection.   There was concern that the drain Felan traverse part of the diaphragm and caused a bile leak into the pleural space.  He underwent thoracentesis by Lorinda Root which yeilded 1.1 L of bloody appearing fluid. The fluid did not appear bilious.   The fluid was sent for labs including pleural fluid bilirubin, however it appears that order was inadvertently canceled and I cannot figure out how or why.  He is here today for replacement of a RUQ drain. He tells me he feels like he has another pleural effusion as he is more SOB.  No nausea/vomiting. No Fever/chills. ROS negative.   Patient is Full Code  Past Medical History:  Diagnosis Date   A-fib (HCC)    AKI (acute kidney injury) (HCC) 05/18/2021   Anemia    Arthritis    "all over"   Bradycardia    Chronic bronchitis (HCC)    "get it just about q yr" (03/17/2014)   Complication of anesthesia    difficulty waking up   Daily headache    "here lately" (03/17/2014); relates to sinuses   GERD (gastroesophageal reflux disease)    Hard of hearing    hearing aides  bilat   Hiatal hernia    History of blood transfusion 2008   "related to OR"   History of kidney stones    Hyperlipidemia    Hypertension    Pneumonia 1972 X 1   Rheumatoid arthritis (HCC)    Type II diabetes mellitus (HCC)    Wears glasses     Past Surgical History:  Procedure Laterality Date   ATRIAL FIBRILLATION ABLATION N/A 05/23/2023   Procedure: ATRIAL FIBRILLATION ABLATION;  Surgeon: [name], MD;  Location: [facility] INVASIVE CV LAB;  Service: Cardiovascular;  Laterality: N/A;   broken finger      surgical repaired left hand 2nd finger    CARDIOVERSION N/A 11/08/2022   Procedure: CARDIOVERSION;  Surgeon: [name], MD;  Location: [facility] INVASIVE CV LAB;  Service: Cardiovascular;  Laterality: N/A;   cyst removed      per left knee/posteriorly   CYSTECTOMY Left 2009   "behind knee"   INGUINAL HERNIA REPAIR Right ?2010   IR CV LINE INJECTION  10/02/2023   IR THORACENTESIS ASP PLEURAL SPACE W/IMG GUIDE  10/03/2023   JOINT REPLACEMENT     KNEE ARTHROSCOPY Bilateral 1980's   LIVER BIOPSY N/A 09/05/2023   Procedure: BIOPSY, LIVER;  Surgeon: [name], MD;  Location: [facility] ORS;  Service: General;  Laterality: N/A;   PARTIAL KNEE ARTHROPLASTY Right 08/27/2015  Procedure: RIGHT UNICOMPARTMENTAL KNEE ARTHROPLASTY;  Surgeon: [name], MD;  Location: [facility] ORS;  Service: Orthopedics;  Laterality: Right;   REVISION TOTAL KNEE ARTHROPLASTY Left 2008   SHOULDER SURGERY Left 2019   TOTAL KNEE ARTHROPLASTY Left 2003    Allergies: Codeine, Lipitor [atorvastatin], Invokana [canagliflozin], Losartan, Roxicodone  [oxycodone ], and Vicodin [hydrocodone -acetaminophen ]  Medications: Prior to Admission medications   Medication Sig Start Date End Date Taking? Authorizing Provider  amLODipine  (NORVASC ) 5 MG tablet Take 1 tablet (5 mg total) by mouth daily. 09/04/23  Yes [name], MD  apixaban  (ELIQUIS ) 5 MG TABS tablet Take 1 tablet (5 mg total) by mouth 2 (two) times  daily. 07/16/23  Yes Lenise Quince, MD  ascorbic acid  (VITAMIN C ) 500 MG tablet Take 500 mg by mouth daily.   Yes [provider]  calcium carbonate (OSCAL) 1500 (600 Ca) MG TABS tablet Take 600 mg of elemental calcium by mouth daily.   Yes [provider]  cholecalciferol (VITAMIN D3) 25 MCG (1000 UNIT) tablet Take 1,000 Units by mouth daily.   Yes [provider]  Insulin  Aspart, w/Niacinamide, (FIASP ) 100 UNIT/ML SOLN 100 Units. Insulin  pump 09/27/22  Yes [provider]  insulin  glargine (LANTUS ) 100 UNIT/ML injection Inject 16 Units into the skin as needed (If pump goes out).   Yes [provider]  KRILL OIL PO Take 400 mg by mouth daily.   Yes [provider]  loratadine  (CLARITIN ) 10 MG tablet Take 10 mg by mouth daily.   Yes [provider]  methocarbamol  (ROBAXIN ) 500 MG tablet Take 1 tablet (500 mg total) by mouth every 12 (twelve) hours as needed for muscle spasms. 09/28/23  Yes [name], MD  ondansetron  (ZOFRAN -ODT) 4 MG disintegrating tablet Take 1 tablet (4 mg total) by mouth every 8 (eight) hours as needed for nausea or vomiting. 09/09/23  Yes Ninetta Basket, MD  pantoprazole  (PROTONIX ) 40 MG tablet TAKE 1 TABLET BY MOUTH DAILY 06/27/23  Yes Mealor, Augustus E, MD  Potassium 99 MG TABS Take 99 mg by mouth daily.   Yes [provider]  pyridOXINE (VITAMIN B6) 50 MG tablet Take 50 mg by mouth daily.   Yes [provider]  ramipril  (ALTACE ) 5 MG capsule Take 1 capsule (5 mg total) by mouth daily. 09/04/23  Yes [name], MD  traMADol  (ULTRAM ) 50 MG tablet Take 1 tablet (50 mg total) by mouth every 6 (six) hours as needed for moderate pain (pain score 4-6). 09/25/23  Yes [place], Eric J, DO  vitamin E  400 UNIT capsule Take 400 Units by mouth in the morning.   Yes [provider]  zinc gluconate 50 MG tablet Take 50 mg by mouth in the morning.   Yes [provider]  Abatacept   (ORENCIA  IV) Inject 750 mg into the vein every 28 (twenty-eight) days.    [provider]  albuterol  (PROVENTIL ) (2.5 MG/3ML) 0.083% nebulizer solution Take 3 mLs (2.5 mg total) by nebulization every 6 (six) hours as needed for wheezing or shortness of breath. 10/02/23   [name], MD  albuterol  (VENTOLIN  HFA) 108 (90 Base) MCG/ACT inhaler Inhale 2 puffs into the lungs every 6 (six) hours as needed for wheezing or shortness of breath. 10/01/23   [name], MD  Insulin  Human (INSULIN  PUMP) SOLN Inject into the skin continuous. Fiasp  77 units    [provider]  magic mouthwash (nystatin , lidocaine , diphenhydrAMINE , alum & mag hydroxide) suspension Swish  and swallow 5 mLs 3 (three) times daily as needed for mouth pain. 09/26/23   [name], MD  Multiple Vitamin (MULTIVITAMIN WITH MINERALS) TABS Take 1 tablet by mouth daily. Centrum    [provider]  ONE TOUCH ULTRA TEST test strip 1 each daily. Pt says 2-3 times daily 05/26/16   [provider]  pravastatin  (PRAVACHOL ) 40 MG tablet Take 40 mg by mouth daily.    [provider]  sodium chloride  flush 0.9 % SOLN injection Use 1 flush as directed as needed 09/26/23 11/25/23  Carim, Gordy Lauber, PA-C     Family History  Problem Relation Age of Onset   Cancer Father        prostate    Social History   Socioeconomic History   Marital status: Married    Spouse name: Nadean August   Number of children: 3   Years of education: College   Highest education level: Not on file  Occupational History    Employer: UNEMPLOYED  Tobacco Use   Smoking status: Former    Current packs/day: 0.00    Average packs/day: 1 pack/day for 5.0 years (5.0 ttl pk-yrs)    Types: Cigarettes, Cigars    Start date: 05/16/1963    Quit date: 05/15/1968    Years since quitting: 55.4    Passive exposure: Never   Smokeless tobacco: Never   Tobacco comments:    Former smoker 06/20/23  Vaping Use   Vaping status: Never Used   Substance and Sexual Activity   Alcohol use: No    Alcohol/week: 0.0 standard drinks of alcohol   Drug use: Never   Sexual activity: Yes  Other Topics Concern   Not on file  Social History Narrative   Patient lives at home with family.   Caffeine Use: occasionally   Social Drivers of Corporate investment banker Strain: Not on file  Food Insecurity: No Food Insecurity (09/27/2023)   Hunger Vital Sign    Worried About Running Out of Food in the Last Year: Never true    Ran Out of Food in the Last Year: Never true  Transportation Needs: No Transportation Needs (09/27/2023)   PRAPARE - Administrator, Civil Service (Medical): No    Lack of Transportation (Non-Medical): No  Physical Activity: Not on file  Stress: Not on file  Social Connections: Unknown (09/21/2023)   Social Connection and Isolation Panel [NHANES]    Frequency of Communication with Friends and Family: Twice a week    Frequency of Social Gatherings with Friends and Family: More than three times a week    Attends Religious Services: Never    Database administrator or Organizations: No    Attends Banker Meetings: Never    Marital Status: Patient declined     Review of Systems: A 12 point ROS discussed and pertinent positives are indicated in the HPI above.  All other systems are negative.  Review of Systems  Vital Signs: BP (!) 145/66   Pulse 60   Temp 98.5 F (36.9 C) (Oral)   Resp 18   Ht 5\' 6"  (1.676 m)   Wt 180 lb 8 oz (81.9 kg)   SpO2 95%   BMI 29.13 kg/m   Advance Care Plan: The advanced care place/surrogate decision maker was discussed at the time of visit and the patient did not wish to discuss or was not able to name a surrogate decision maker or provide an advance care plan.  Physical Exam Vitals reviewed.  Constitutional:      Appearance: Normal appearance.  HENT:     Head: Normocephalic and atraumatic.  Eyes:     Extraocular Movements: Extraocular movements  intact.  Cardiovascular:     Rate and Rhythm: Normal rate and regular rhythm.  Pulmonary:     Effort: Pulmonary effort is normal. No respiratory distress.     Breath sounds: Normal breath sounds.  Abdominal:     General: There is no distension.     Palpations: Abdomen is soft.     Tenderness: There is no abdominal tenderness.     Comments: RUQ drain in place with bilious output in the bag.  Musculoskeletal:        General: Normal range of motion.     Cervical back: Normal range of motion.  Skin:    General: Skin is warm and dry.  Neurological:     General: No focal deficit present.     Mental Status: He is alert and oriented to person, place, and time.  Psychiatric:        Mood and Affect: Mood normal.        Behavior: Behavior normal.        Thought Content: Thought content normal.        Judgment: Judgment normal.     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 EXAM: ULTRASOUND GUIDED RIGHT THORACENTESIS MEDICATIONS: 1% lidocaine  10 mL COMPLICATIONS: None immediate. PROCEDURE: An ultrasound guided thoracentesis was thoroughly discussed with the patient and questions answered. The benefits, risks, alternatives and complications were also discussed. The patient understands and wishes to proceed with the procedure. Written consent was obtained. Ultrasound was performed to localize and mark an adequate pocket of fluid in the right chest. The area was then prepped and draped in the normal sterile fashion. 1% Lidocaine  was used for local anesthesia. Under ultrasound guidance a 6 Fr Safe-T-Centesis catheter was introduced. Thoracentesis was performed. The catheter was removed and a dressing applied. FINDINGS: A total of approximately 1.1 L of dark red, cloudy fluid was removed.  Samples were sent to the laboratory as requested by the clinical team. IMPRESSION: Successful ultrasound guided right thoracentesis yielding dark red, cloudy of pleural fluid. Performed By Lorinda Root, PA-C Electronically Signed   By: Creasie Doctor M.D.   On: 10/03/2023 15:11   DG Chest 1 View Result Date: 10/03/2023 CLINICAL DATA:  78 year old male status post thoracentesis EXAM: CHEST  1 VIEW COMPARISON:  CT abdomen 10/03/2023 FINDINGS: Cardiomediastinal silhouette within normal limits. Trace right-sided pleural effusion. No pneumothorax. Lungs are well aerated. Abdominal drainage catheter in the right upper quadrant. No displaced fracture IMPRESSION: No complicating features after right-sided thoracentesis Electronically Signed   By: Myrlene Asper D.O.   On: 10/03/2023 14:57   CT ABDOMEN PELVIS W CONTRAST Result Date: 10/03/2023 CLINICAL DATA:  Drain after cholecystectomy. EXAM: CT ABDOMEN AND PELVIS WITH CONTRAST TECHNIQUE: Multidetector CT imaging of the abdomen and pelvis was performed using the standard protocol following bolus administration of intravenous contrast. RADIATION DOSE REDUCTION: This exam was performed according to the departmental dose-optimization program which includes automated exposure control, adjustment of the mA and/or kV according to patient size and/or use of iterative reconstruction technique. CONTRAST:  100mL ISOVUE-300 IOPAMIDOL (ISOVUE-300) INJECTION 61% COMPARISON:  CT abdomen and pelvis 09/21/2023.  FINDINGS: Lower chest: Large right pleural effusion with partial right lower lobar atelectasis. No cardiomegaly or pericardial effusion. Hepatobiliary: Normal hepatic size and contour. No focal liver lesion. Interval percutaneous drainage catheter in the gallbladder fossa with resolution of the previously described collection. No biliary ductal dilation. Pancreas: Unremarkable. No pancreatic ductal dilatation or surrounding inflammatory changes. Spleen: Normal in size without  focal abnormality. Adrenals/Urinary Tract: Adrenal glands are unremarkable. Kidneys are normal, without renal calculi, focal lesion, or hydronephrosis. Simple bilateral renal cortical cysts. Bladder is unremarkable. Stomach/Bowel: Mild residual wall thickening of the gastric antrum and proximal duodenum, improved from prior and again likely reactive. No obstruction. Colonic diverticulosis. No pericolonic inflammation. Normal appendix. Vascular/Lymphatic: Aortic atherosclerosis. No enlarged abdominal or pelvic lymph nodes. Reproductive: Prostate is unremarkable. Other: No abdominal wall hernia or abnormality. No abdominopelvic ascites. Musculoskeletal: No acute or significant osseous findings. Multilevel degenerative changes. Bilateral L5 pars interarticularis defects. IMPRESSION: 1. Interval percutaneous drainage catheter in the gallbladder fossa with resolution of the previously described collection. 2. New, large right pleural effusion with right lower lobar atelectasis. Electronically Signed   By: Rox Cope M.D.   On: 10/03/2023 12:36   IR INJECT INDWELLING DRAINAGE CATHETER Result Date: 10/02/2023 INDICATION: Gallbladder fossa abscess following cholecystectomy status post percutaneous drain 09/22/2023 leakage at the drain skin site. EXAM: FLUOROSCOPIC INJECTION OF THE GALLBLADDER FOSSA ABSCESS DRAIN MEDICATIONS: None. ANESTHESIA/SEDATION: None. COMPLICATIONS: None immediate. PROCEDURE: Informed written consent was obtained from the patient after a thorough discussion of the procedural risks, benefits and alternatives. All questions were addressed. Maximal Sterile Barrier Technique was utilized including caps, mask, sterile gowns, sterile gloves, sterile drape, hand hygiene and skin antiseptic. A timeout was performed prior to the initiation of the procedure. Under sterile conditions, the existing right upper quadrant gallbladder fossa abscess drain was injected with contrast. Fluoroscopic imaging  performed. There is fistula communication from the collapsed abscess cavity to an adjacent right hepatic duct. Contrast opacifies the biliary tree, biliary confluence, common hepatic duct and common bile duct. Biliary tree is patent with contrast opacifying the duodenum. There is reflux of contrast into the residual cystic duct. Contrast also leaks along the catheter tract into the perihepatic space suggesting an underlying perihepatic fluid collection/biloma. Included fluoroscopic imaging of the right chest demonstrates a moderate size right pleural effusion as well. IMPRESSION: Collapse gallbladder fossa abscess cavity has a fistula communication to a small right hepatic duct. Opacified biliary tree is patent with contrast draining into the duodenum. Perihepatic leakage of contrast during the injection suggest perihepatic fluid collection/biloma. Recommend further assessment with abdomen pelvis CT with contrast. Moderate-sized right pleural effusion. This Sanko warrant right thoracentesis. Electronically Signed   By: Melven Stable.  Shick M.D.   On: 10/02/2023 16:37   DG Chest 1 View Result Date: 09/24/2023 CLINICAL DATA:  Pain. EXAM: CHEST  1 VIEW COMPARISON:  05/28/2023. FINDINGS: Low lung volumes. The heart size and mediastinal contours are within normal limits. Aortic atherosclerosis. Mild bibasilar linear and subsegmental atelectasis. No sizable pleural effusion or pneumothorax. No acute osseous abnormality. Percutaneous drainage catheter overlies the right upper quadrant. IMPRESSION: Low lung volumes with mild bibasilar linear and subsegmental atelectasis. Electronically Signed   By: Mannie Seek M.D.   On: 09/24/2023 16:23   DG Shoulder Right Result Date: 09/24/2023 CLINICAL DATA:  Right shoulder pain. EXAM: RIGHT SHOULDER - 2+ VIEW COMPARISON:  None Available. FINDINGS: There is no evidence of acute fracture or dislocation. Mild degenerative changes of the glenohumeral joint. Mild degenerative changes of the  acromioclavicular joint with intra-articular body. Soft tissues are grossly unremarkable. IMPRESSION: 1. No acute osseous abnormality. 2. Mild degenerative changes of the right glenohumeral and acromioclavicular joints. Electronically Signed   By: Mannie Seek M.D.   On: 09/24/2023 16:18   DG Cervical Spine 2 or 3 views Result Date: 09/24/2023 CLINICAL DATA:  Neck pain. EXAM: CERVICAL SPINE - 2-3 VIEW COMPARISON:  CT cervical spine dated 08/20/2021. FINDINGS: There is no evidence of acute cervical spine fracture or prevertebral soft tissue swelling. Multilevel degenerative disc height loss and endplate osteophytosis most pronounced at C4-C7 with similar mild anterolisthesis of C7 on T1. IMPRESSION: 1. No acute osseous abnormality. 2. Multilevel degenerative changes of the cervical spine, as above. Electronically Signed   By: Mannie Seek M.D.   On: 09/24/2023 16:16   CT GUIDED PERITONEAL/RETROPERITONEAL FLUID DRAIN BY PERC CATH Result Date: 09/22/2023 CLINICAL DATA:  Gallbladder fossa abscess. Status post cholecystectomy on 09/05/2023. EXAM: CT GUIDED CATHETER DRAINAGE OF PERITONEAL ABSCESS ANESTHESIA/SEDATION: Moderate (conscious) sedation was employed during this procedure. A total of Versed  4.0 mg and Fentanyl  200 mcg was administered intravenously. Moderate Sedation Time: 30 minutes. The patient's level of consciousness and vital signs were monitored continuously by radiology nursing throughout the procedure under my direct supervision. PROCEDURE: The procedure, risks, benefits, and alternatives were explained to the patient. Questions regarding the procedure were encouraged and answered. The patient understands and consents to the procedure. A time out was performed prior to initiating the procedure. The abdominal wall was prepped with chlorhexidine  in a sterile fashion, and a sterile drape was applied covering the operative field. A sterile gown and sterile gloves were used for the procedure.  Local anesthesia was provided with 1% Lidocaine . CT was performed in a supine position. From a right anterolateral approach, an 18 gauge trocar needle was advanced into a gallbladder fossa abscess. After return of fluid, a guidewire was advanced into the collection, the tract dilated and a 10 French percutaneous drain advanced over the wire. The drain was formed and additional 20 mL fluid sample withdrawn for culture analysis. The drain was flushed with sterile saline and attached to a suction bulb. It was secured at the skin with a Prolene retention suture and adhesive StatLock device. RADIATION DOSE REDUCTION: This exam was performed according to the departmental dose-optimization program which includes automated exposure control, adjustment of the mA and/or kV according to patient size and/or use of iterative reconstruction technique. COMPLICATIONS: None FINDINGS: The gallbladder fossa abscess containing fluid and air yielded grossly purulent fluid. After drain placement there is good return of purulent fluid. A fluid sample was sent for culture analysis. There was a limited window for approach to the second probable complex fluid collection abutting the proximal duodenum. After drain placement and partial decompression of the gallbladder fossa abscess, it appeared that the adjacent collection Grau be slightly smaller and therefore it was chosen to not perform a second aspiration/drainage procedure at this time and watch this collection. IMPRESSION: CT-guided percutaneous catheter drainage of gallbladder fossa abscess yielding purulent fluid. A 10 French drain was placed and attached to suction bulb drainage. A sample of purulent fluid was sent for culture analysis. A second adjacent fluid collection near the proximal duodenum appeared potentially slightly smaller after partial decompression of the gallbladder fossa abscess so a second aspiration drainage procedure was not performed at this time. Electronically  Signed   By: Erica Hau M.D.   On: 09/22/2023 12:59   DG Abd Portable 1 View Result Date:  09/21/2023 CLINICAL DATA:  NG tube placement. EXAM: PORTABLE ABDOMEN - 1 VIEW COMPARISON:  None Available. FINDINGS: A nasogastric tube is seen with its distal tip overlying the expected region of the body of the stomach. Its distal side hole sits approximately 6.5 cm distal to the expected region of the gastroesophageal junction. The bowel gas pattern is normal. No radio-opaque calculi or other significant radiographic abnormality are seen. IMPRESSION: Nasogastric tube positioning, as described above. Electronically Signed   By: Virgle Grime M.D.   On: 09/21/2023 18:47   NM HEPATOBILIARY LEAK (POST-SURGICAL) Result Date: 09/21/2023 CLINICAL DATA:  Acute abdominal pain, rule out bile leak. Cholecystectomy 09/05/2023 EXAM: NUCLEAR MEDICINE HEPATOBILIARY IMAGING TECHNIQUE: Sequential images of the abdomen were obtained out to 60 minutes following intravenous administration of radiopharmaceutical. RADIOPHARMACEUTICALS:  Five mCi Tc-59m  Choletec  IV COMPARISON:  CT abdomen 09/21/2023 FINDINGS: Satisfactory uptake of radiopharmaceutical from the blood pool. Biliary activity at 9 minutes and bowel activity at 11 minutes. We carried out imaging for 2 full hours. No abnormal extension of radiopharmaceutical to suggest bile leak. IMPRESSION: 1. No bile leak is demonstrated. We carried out observation for 2 full hours. 2. Patent common bile duct. Electronically Signed   By: Freida Jes M.D.   On: 09/21/2023 16:41   CT ABDOMEN PELVIS W CONTRAST Result Date: 09/21/2023 CLINICAL DATA:  Acute generalized abdominal pain. EXAM: CT ABDOMEN AND PELVIS WITH CONTRAST TECHNIQUE: Multidetector CT imaging of the abdomen and pelvis was performed using the standard protocol following bolus administration of intravenous contrast. RADIATION DOSE REDUCTION: This exam was performed according to the departmental dose-optimization  program which includes automated exposure control, adjustment of the mA and/or kV according to patient size and/or use of iterative reconstruction technique. CONTRAST:  75mL OMNIPAQUE  IOHEXOL  350 MG/ML SOLN COMPARISON:  September 09, 2023. FINDINGS: Lower chest: No acute abnormality. Hepatobiliary: Status post cholecystectomy. 7.3 x 5.9 cm fluid collection is noted in the gallbladder fossa which is enlarged compared to prior exam which contains air-fluid level and is highly concerning for abscess or possibly biloma. Surrounding inflammatory changes are noted. Pancreas: Unremarkable. No pancreatic ductal dilatation or surrounding inflammatory changes. Spleen: Normal in size without focal abnormality. Adrenals/Urinary Tract: Adrenal glands appear normal. Bilateral renal cysts are noted. No hydronephrosis or renal obstruction is noted. Urinary bladder is unremarkable. Stomach/Bowel: The appendix is unremarkable. Sigmoid diverticulosis is noted without inflammation. Mild to moderate gastric distention is noted concerning for gastric outlet obstruction. This appears to be due to significant wall thickening and inflammatory changes involving the gastric antrum and proximal duodenum, most secondary to adjacent inflammation and abscess in gallbladder fossa. 6.3 x 3.9 cm fluid-filled structure is seen in this area which Kibby represent abscess as well, or potentially be inflamed proximal duodenum. Vascular/Lymphatic: Aortic atherosclerosis. No enlarged abdominal or pelvic lymph nodes. Reproductive: Prostate is unremarkable. Other: No hernia or ascites is noted. Musculoskeletal: No acute or significant osseous findings. IMPRESSION: Status post cholecystectomy. 7.3 x 5.9 cm fluid collection with air-fluid levels is noted in gallbladder fossa which is enlarged compared to prior exam of September 09, 2023 and is most consistent with abscess or possibly biloma. There is also noted 6.3 x 3.9 cm fluid-filled structure medial to this; it is  uncertain if this abnormality represents abscess, or severely inflamed gastric antrum and proximal duodenum. Mild to moderate gastric distention is noted suggesting gastric outlet obstruction. The second fluid collection does appear to extend to the subcapsular region inferiorly of right hepatic lobe. Sigmoid diverticulosis without inflammation.  Aortic Atherosclerosis (ICD10-I70.0). Electronically Signed   By: Rosalene Colon M.D.   On: 09/21/2023 11:24   CT ABDOMEN PELVIS W CONTRAST Result Date: 09/09/2023 CLINICAL DATA:  Status post cholecystectomy on 09/05/2023. Patient now complains of right upper quadrant pain. EXAM: CT ABDOMEN AND PELVIS WITH CONTRAST TECHNIQUE: Multidetector CT imaging of the abdomen and pelvis was performed using the standard protocol following bolus administration of intravenous contrast. RADIATION DOSE REDUCTION: This exam was performed according to the departmental dose-optimization program which includes automated exposure control, adjustment of the mA and/or kV according to patient size and/or use of iterative reconstruction technique. CONTRAST:  OMNIPAQUE  IOHEXOL  300 MG/ML  SOLN COMPARISON:  MRI 08/20/2023. FINDINGS: Lower chest: Atelectatic changes identified within both lung bases with posterior pleural thickening. No significant pleural effusion or consolidative changes. Hepatobiliary: No suspicious liver lesion. Postop change from cholecystectomy. Fluid collection with air-fluid level is identified within the gallbladder fossa measuring 6.1 x 3.2 by 2.9 cm. There is surrounding soft tissue stranding which wraps around the proximal descending duodenum. Small volume of free fluid extends along the inferior margin and lateral margin of the right lobe and along the right pericolic gutter. The common bile duct measures 7 mm in maximum dimension. No calcified common bile duct stones. Pancreas: No main duct dilatation, inflammation or mass. Spleen: Normal in size without focal  abnormality. Adrenals/Urinary Tract: Normal adrenal glands. Bilateral simple appearing cysts are again noted. The largest arises off the lateral left kidney measuring 4.6 cm, image 40/2. No hydronephrosis or nephrolithiasis. Urinary bladder is unremarkable. Stomach/Bowel: Stomach appears distended. There is mild wall thickening involving the proximal descending duodenum with surrounding soft tissue stranding. No pathologic dilatation of the large or small bowel loops. The appendix is visualized and appears normal. Mild stool burden within the colon. Severe sigmoid diverticulosis with signs of chronic diverticular disease. No signs of acute inflammatory change about the sigmoid colon. Vascular/Lymphatic: Aortic atherosclerosis without aneurysm. The upper abdominal vascularity is patent. No enlarged upper abdominal lymph nodes. Reproductive: Prostate gland enlargement.  No mass noted. Other: Small volume of fluid is noted along the proximal right pericolic gutter extending from the inferior margin of the right lobe. Within the right posterior pelvis there is a small amount of free fluid measuring 14.6 Hounsfield units. Musculoskeletal: No acute or suspicious osseous findings. Chronic bilateral L5 pars defects. Multilevel degenerative disc disease and facet arthropathy within the lumbar spine. IMPRESSION: 1. Postop change from cholecystectomy. Fluid collection with air-fluid level is identified within the gallbladder fossa measuring 6.1 x 3.2 x 2.9 cm. There is surrounding soft tissue stranding which wraps around the proximal descending duodenum. Differential considerations include postoperative seroma, hematoma or abscess. If there is a clinical concern for bile leak consider further evaluation with nuclear medicine hepatic biliary scan. 2. Small volume of free fluid extends along the inferior margin and lateral margin of the right lobe and along the right pericolic gutter. Trace fluid noted within the right posterior  pelvis. 3. Severe sigmoid diverticulosis with signs of chronic diverticular disease. No signs of acute inflammatory change about the sigmoid colon. 4. Prostate gland enlargement. 5. Chronic bilateral L5 pars defects. 6.  Aortic Atherosclerosis (ICD10-I70.0). Electronically Signed   By: Kimberley Penman M.D.   On: 09/09/2023 10:06    Labs:  CBC: Recent Labs    09/22/23 0359 09/23/23 0547 09/24/23 0504 10/05/23 0905  WBC 9.4 9.9 7.8 9.4  HGB 11.7* 11.4* 10.8* 12.4*  HCT 35.1* 33.5* 32.5* 35.7*  PLT  584* 596* 528* 473*    COAGS: Recent Labs    05/28/23 1737 06/01/23 1343 08/13/23 0852 08/17/23 1413 09/22/23 0359 10/05/23 0905  INR 2.8*   < > 2.3 1.1 1.2 1.2  APTT 50*  --   --   --   --   --    < > = values in this interval not displayed.    BMP: Recent Labs    09/22/23 0359 09/23/23 0547 09/24/23 0504 09/25/23 0555  NA 135 137 134* 136  K 3.7 3.5 3.6 3.1*  CL 97* 98 97* 101  CO2 27 29 29 29   GLUCOSE 92 67* 98 93  BUN 18 14 11 12   CALCIUM 8.3* 8.6* 8.1* 8.2*  CREATININE 1.18 1.08 1.00 1.05  GFRNONAA >60 >60 >60 >60    LIVER FUNCTION TESTS: Recent Labs    08/17/23 1413 09/03/23 0807 09/09/23 0907 09/21/23 0734  BILITOT 1.6* 0.9 1.4* 1.0  AST 453* 28 51* 34  ALT 503* 32 51* 20  ALKPHOS 468* 149* 157* 99  PROT 6.3 6.3* 7.3 6.7  ALBUMIN 4.5 3.7 4.2 2.9*    TUMOR MARKERS: No results for input(s): "AFPTM", "CEA", "CA199", "CHROMGRNA" in the last 8760 hours.  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  Will proceed with image guided drain placement and thoracentesis today by Dr. Mabel Savage.  Risks and benefits discussed with the patient including bleeding, infection, damage to adjacent structures, bowel perforation/fistula connection, and sepsis.  Risks and benefits of thoracentesis were discussed with the patient including, but not limited to bleeding, infection,  pneumothorax, and that fact that all the fluid Hitchman not be removed during today's procedure.  All of the patient's questions were answered, patient is agreeable to proceed. Consent signed and in chart.   Electronically Signed: Connor Deiters, PA-C   10/05/2023, 11:48 AM      I spent a total of    25 Minutes in face to face in clinical consultation, greater than 50% of which was counseling/coordinating care for drain placement/thoracentesis.

## 2023-10-05 NOTE — Procedures (Signed)
 Interventional Radiology Procedure Note  Procedure:   US  guided right thoracentesis. ~550cc serosanguinous fluid aspirated  Complications: None Recommendations:    - Routine wound care  - CXR pending  Signed,  Marciano Settles. Mabel Savage, DO

## 2023-10-05 NOTE — ED Provider Notes (Signed)
 [facility] Provider Note   CSN: [id number] Arrival date & time: 10/05/23  1834     History  Chief Complaint  Patient presents with   Back Pain    Decreased Drainage at Abdominal drain    Joel Jones Sr. is a 78 y.o. male.  78 year old male presenting emergency department with right back pain from thoracentesis done today by IR.  Also having some pain around drain that was exchanged today outpatient.  Family notes that prior drain was draining bile, but current drain is output any output except for scant bloodly appearing fluid.  Patient denies chest pain, shortness of breath.  No nausea no vomiting.  Reports pain is tolerable with fentanyl .   Back Pain      Home Medications Prior to Admission medications   Medication Sig Start Date End Date Taking? Authorizing Provider  Abatacept  (ORENCIA  IV) Inject 750 mg into the vein every 28 (twenty-eight) days.    [provider]  albuterol  (PROVENTIL ) (2.5 MG/3ML) 0.083% nebulizer solution Take 3 mLs (2.5 mg total) by nebulization every 6 (six) hours as needed for wheezing or shortness of breath. 10/02/23   [name], MD  albuterol  (VENTOLIN  HFA) 108 (90 Base) MCG/ACT inhaler Inhale 2 puffs into the lungs every 6 (six) hours as needed for wheezing or shortness of breath. 10/01/23   [name], MD  amLODipine  (NORVASC ) 5 MG tablet Take 1 tablet (5 mg total) by mouth daily. 09/04/23   [name], MD  apixaban  (ELIQUIS ) 5 MG TABS tablet Take 1 tablet (5 mg total) by mouth 2 (two) times daily. 07/16/23   Lenise Quince, MD  ascorbic acid  (VITAMIN C ) 500 MG tablet Take 500 mg by mouth daily.    [provider]  calcium carbonate (OSCAL) 1500 (600 Ca) MG TABS tablet Take 600 mg of elemental calcium by mouth daily.    [provider]  cholecalciferol (VITAMIN D3) 25 MCG (1000 UNIT) tablet Take 1,000 Units by mouth daily.    [provider]   HYDROmorphone  (DILAUDID ) 2 MG tablet Take 0.5 tablets (1 mg total) by mouth every 6 (six) hours as needed for up to 3 days for severe pain (pain score 7-10). 10/06/23 10/09/23  Rolinda Climes, DO  Insulin  Aspart, w/Niacinamide, (FIASP ) 100 UNIT/ML SOLN 100 Units. Insulin  pump 09/27/22   [provider]  insulin  glargine (LANTUS ) 100 UNIT/ML injection Inject 16 Units into the skin as needed (If pump goes out).    [provider]  Insulin  Human (INSULIN  PUMP) SOLN Inject into the skin continuous. Fiasp  77 units    [provider]  KRILL OIL PO Take 400 mg by mouth daily.    [provider]  loratadine  (CLARITIN ) 10 MG tablet Take 10 mg by mouth daily.    [provider]  magic mouthwash (nystatin , lidocaine , diphenhydrAMINE , alum & mag hydroxide) suspension Swish and swallow 5 mLs 3 (three) times daily as needed for mouth pain. 09/26/23   [name], MD  methocarbamol  (ROBAXIN ) 500 MG tablet Take 1 tablet (500 mg total) by mouth every 12 (twelve) hours as needed for muscle spasms. 09/28/23   [name], MD  Multiple Vitamin (MULTIVITAMIN WITH MINERALS) TABS Take 1 tablet by mouth daily. Centrum    [provider]  ondansetron  (ZOFRAN -ODT) 4 MG disintegrating tablet Take 1 tablet (4 mg total) by mouth every 8 (eight) hours as needed for nausea or vomiting. 09/09/23  Ninetta Basket, MD  ONE TOUCH ULTRA TEST test strip 1 each daily. Pt says 2-3 times daily 05/26/16   [provider]  pantoprazole  (PROTONIX ) 40 MG tablet TAKE 1 TABLET BY MOUTH DAILY 06/27/23   Mealor, Augustus E, MD  Potassium 99 MG TABS Take 99 mg by mouth daily.    [provider]  pravastatin  (PRAVACHOL ) 40 MG tablet Take 40 mg by mouth daily.    [provider]  pyridOXINE (VITAMIN B6) 50 MG tablet Take 50 mg by mouth daily.    [provider]  ramipril  (ALTACE ) 5 MG capsule Take 1 capsule (5 mg total) by mouth daily. 09/04/23   [name], MD  sodium chloride  flush 0.9 % SOLN injection Use 1 flush as directed as needed 09/26/23 11/25/23  Carim, Charles A, PA-C  traMADol  (ULTRAM ) 50 MG tablet Take 1 tablet (50 mg total) by mouth every 6 (six) hours as needed for moderate pain (pain score 4-6). 09/25/23   [place], Joel Care, DO  vitamin E  400 UNIT capsule Take 400 Units by mouth in the morning.    [provider]  zinc gluconate 50 MG tablet Take 50 mg by mouth in the morning.    [provider]      Allergies    Codeine, Lipitor [atorvastatin], Invokana [canagliflozin], Losartan, Roxicodone  [oxycodone ], and Vicodin [hydrocodone -acetaminophen ]    Review of Systems   Review of Systems  Musculoskeletal:  Positive for back pain.    Physical Exam Updated Vital Signs BP (!) 141/65   Pulse 67   Temp 98.4 F (36.9 C)   Resp (!) 22   SpO2 96%  Physical Exam Vitals and nursing note reviewed.  Constitutional:      General: He is not in acute distress.    Appearance: He is not toxic-appearing.  HENT:     Head: Normocephalic and atraumatic.     Nose: Nose normal.  Eyes:     Conjunctiva/sclera: Conjunctivae normal.  Cardiovascular:     Rate and Rhythm: Normal rate and regular rhythm.  Pulmonary:     Effort: Pulmonary effort is normal.  Abdominal:     General: Abdomen is flat. There is no distension.     Palpations: Abdomen is soft.     Tenderness: There is no abdominal tenderness. There is no guarding or rebound.     Comments: Drain in right upper quadrant.  Scant maroon-colored fluid.  Abdomen soft.  Nontender.  Musculoskeletal:        General: Normal range of motion.  Skin:    General: Skin is warm and dry.     Capillary Refill: Capillary refill takes less than 2 seconds.  Neurological:     Mental Status: He is alert and oriented to person, place, and time.  Psychiatric:        Mood and Affect: Mood normal.        Behavior: Behavior normal.     ED Results / Procedures / Treatments    Labs (all labs ordered are listed, but only abnormal results are displayed) Labs Reviewed  COMPREHENSIVE METABOLIC PANEL WITH GFR - Abnormal; Notable for the following components:      Result Value   Sodium 127 (*)    Chloride 92 (*)    Glucose, Bld 512 (*)    Calcium 8.3 (*)    Total Protein 6.2 (*)    Albumin 2.5 (*)    AST 44 (*)    All other components within  normal limits  CBC WITH DIFFERENTIAL/PLATELET - Abnormal; Notable for the following components:   WBC 10.7 (*)    RBC 4.21 (*)    Hemoglobin 12.3 (*)    HCT 36.9 (*)    Platelets 450 (*)    Neutro Abs 8.4 (*)    Abs Immature Granulocytes 0.08 (*)    All other components within normal limits  URINALYSIS, ROUTINE W REFLEX MICROSCOPIC - Abnormal; Notable for the following components:   Glucose, UA >=500 (*)    Ketones, ur 20 (*)    All other components within normal limits  CBG MONITORING, ED - Abnormal; Notable for the following components:   Glucose-Capillary 489 (*)    All other components within normal limits  CBG MONITORING, ED - Abnormal; Notable for the following components:   Glucose-Capillary 411 (*)    All other components within normal limits  LIPASE, BLOOD  I-STAT CG4 LACTIC ACID, ED  I-STAT CG4 LACTIC ACID, ED    EKG None  Radiology CT CHEST ABDOMEN PELVIS W CONTRAST Result Date: 10/05/2023 CLINICAL DATA:  Rule out leakage from chest tube placement. Back pain EXAM: CT CHEST, ABDOMEN, AND PELVIS WITH CONTRAST TECHNIQUE: Multidetector CT imaging of the chest, abdomen and pelvis was performed following the standard protocol during bolus administration of intravenous contrast. RADIATION DOSE REDUCTION: This exam was performed according to the departmental dose-optimization program which includes automated exposure control, adjustment of the mA and/or kV according to patient size and/or use of iterative reconstruction technique. CONTRAST:  75mL OMNIPAQUE  IOHEXOL  350 MG/ML SOLN COMPARISON:  CT chest 03/09/2017  FINDINGS: CT CHEST FINDINGS Cardiovascular: Normal heart size. No significant pericardial effusion. The thoracic aorta is normal in caliber. No atherosclerotic plaque of the thoracic aorta. No coronary artery calcifications. Mediastinum/Nodes: Chronic stable calcified and noncalcified mediastinal lymphadenopathy. Redemonstration of subdiaphragmatic/pericardiac lymph nodes measuring up to 1.1 cm (3: 45-46). No enlarged hilar or axillary lymph nodes. Thyroid  gland, trachea, and esophagus demonstrate no significant findings. Lungs/Pleura: Right lower lobe partial collapse. No focal consolidation. No pulmonary nodule. No pulmonary mass. Similar-appearing loculated right pleural effusion. No pneumothorax. Musculoskeletal: No abdominal wall hernia or abnormality. No suspicious lytic or blastic osseous lesions. No acute displaced fracture. Multilevel degenerative changes of the spine. CT ABDOMEN PELVIS FINDINGS Hepatobiliary: Cholecystostomy tube pigtail in the post cholecystectomy surgical bed with question developing 2 x 1.5 cm fluid collection with associated adjacent trace free fluid and gas. No focal liver abnormality. Status post cholecystectomy. No biliary dilatation. Pancreas: Diffusely atrophic. No focal lesion. Otherwise normal pancreatic contour. No surrounding inflammatory changes. No main pancreatic ductal dilatation. Spleen: Normal in size without focal abnormality. Adrenals/Urinary Tract: No adrenal nodule bilaterally. Bilateral kidneys enhance symmetrically. Fluid density lesions likely represent simple renal cysts. Simple renal cysts, in the absence of clinically indicated signs/symptoms, require no independent follow-up. No hydronephrosis. No hydroureter.  No nephroureterolithiasis. The urinary bladder is unremarkable. On delayed imaging, there is no urothelial wall thickening and there are no filling defects in the opacified portions of the bilateral collecting systems or ureters. Stomach/Bowel: Stomach  is within normal limits. No evidence of bowel wall thickening or dilatation. Colonic diverticulosis. Appendix appears normal. Vascular/Lymphatic: No abdominal aorta or iliac aneurysm. Moderate atherosclerotic plaque of the aorta and its branches. No abdominal, pelvic, or inguinal lymphadenopathy. Reproductive: Prostate is unremarkable. Other: No intraperitoneal free fluid. No intraperitoneal free gas. No organized fluid collection. Musculoskeletal: No abdominal wall hernia or abnormality. No suspicious lytic or blastic osseous lesions. No acute displaced fracture. Multilevel degenerative  changes of the spine. IMPRESSION: 1. Similar-appearing loculated right pleural effusion. No chest tube identified. 2. Cholecystostomy tube pigtail in the post cholecystectomy surgical bed with question developing 2 x 1.5 cm fluid collection with associated adjacent trace free fluid and gas. 3. Colonic diverticulosis with no acute diverticulitis. 4.  Aortic Atherosclerosis (ICD10-I70.0). Electronically Signed   By: Morgane  Naveau M.D.   On: 10/05/2023 22:20   DG Chest Port 1 View Result Date: 10/05/2023 CLINICAL DATA:  78 year old male status post thoracentesis EXAM: PORTABLE CHEST 1 VIEW COMPARISON:  10/03/2023 FINDINGS: Cardiomediastinal silhouette unchanged in size and contour. Lungs well aerated with coarsened interstitial markings. No significant airspace disease. No pneumothorax. No large pleural fluid. Drainage catheter within the right upper quadrant has been exchanged in the interval IMPRESSION: No complicating features after right-sided thoracentesis Electronically Signed   By: Myrlene Asper D.O.   On: 10/05/2023 16:34   CT THORACENTESIS Result Date: 10/05/2023 INDICATION: 78 year old male presents for thoracentesis EXAM: ULTRASOUND GUIDED RIGHT THORACENTESIS MEDICATIONS: None. COMPLICATIONS: None PROCEDURE: An ultrasound guided thoracentesis was thoroughly discussed with the patient and questions answered. The  benefits, risks, alternatives and complications were also discussed. The patient understands and wishes to proceed with the procedure. Written consent was obtained. Ultrasound was performed to localize and mark an adequate pocket of fluid in the right chest. The area was then prepped and draped in the normal sterile fashion. 1% Lidocaine  was used for local anesthesia. Under ultrasound guidance a 8 Fr Safe-T-Centesis catheter was introduced. Thoracentesis was performed. The catheter was removed and a dressing applied. FINDINGS: A total of approximately 550 cc of serosanguineous fluid was removed. Samples were sent to the laboratory. IMPRESSION: Status post ultrasound-guided right-sided thoracentesis. Signed, Marciano Settles. Rexine Cater, RPVI [facility] Radiology Electronically Signed   By: Myrlene Asper D.O.   On: 10/05/2023 15:09   CT GUIDED PERITONEAL/RETROPERITONEAL FLUID DRAIN BY PERC CATH Result Date: 10/05/2023 INDICATION: 78 year old male status post cholecystectomy with gallbladder fossa collection requiring percutaneous drainage 09/22/2023. The patient then developed a right-sided pleural effusion with scant drain output. Drain injection demonstrates communication with the pleural effusion, most likely bilious pleural fluid. Also there is evidence of a bile leak into accessory right-sided bile duct. Today he presents for attempted repositioning of the drain, to move the drain from a trans pleural location. EXAM: CT-GUIDED DRAIN PLACEMENT TECHNIQUE: Multidetector CT imaging of the abdomen was performed following the standard protocol without IV contrast. RADIATION DOSE REDUCTION: This exam was performed according to the departmental dose-optimization program which includes automated exposure control, adjustment of the mA and/or kV according to patient size and/or use of iterative reconstruction technique. MEDICATIONS: 2 g cefoxitin . The antibiotics were  administered within an appropriate time frame prior to the initiation of the procedure. ANESTHESIA/SEDATION: Moderate (conscious) sedation was employed during this procedure. A total of Versed  2.5 mg and Fentanyl  75 mcg was administered intravenously by the radiology nurse. Total intra-service moderate Sedation Time: 66 minutes. The patient's level of consciousness and vital signs were monitored continuously by radiology nursing throughout the procedure under my direct supervision. COMPLICATIONS: None PROCEDURE: Informed written consent was obtained from the patient after a thorough discussion of the procedural risks, benefits and alternatives. All questions were addressed. Maximal Sterile Barrier Technique was utilized including caps, mask, sterile gowns, sterile gloves, sterile drape, hand hygiene and skin antiseptic. A timeout was performed prior to the initiation of the procedure. Patient was positioned supine on the CT gantry table. Scout  CT acquired for planning purposes. The patient was then prepped and draped in the usual sterile fashion. 1% lidocaine  was used for local anesthesia. Approximately 20 cc of saline infused through the indwelling drain in the gallbladder fossa. The intention was to create a pocket of fluid for targeting, however, the 20 cc of saline did not accumulate significantly in the gallbladder fossa, and likely instead traveled through the biliary system. Using CT guidance, a trocar needle was advanced from a more inferior, posterior position in a caudal-cranial trajectory targeting the drain in the gallbladder fossa. Once we confirmed needle tip position, modified Seldinger technique was used to place a 10 [employer] pigtail catheter Once the catheter was in position, serial CT imaging was used to withdraw to an adequate position in the gallbladder fossa. The former drain was removed. Approximately 20 cc of saline was flushed through the new Brandy Cal drain which promptly  exited the former skin site of the prior drain placement confirming adequate location. The former location was then sealed with Dermabond. The patient tolerated the procedure well and remained hemodynamically stable throughout. No complications were encountered and no significant blood loss. IMPRESSION: Status post CT-guided drain replacement into the gallbladder fossa, for bile leak. The former drain contributing to bilious pleural fluid collection was removed. Signed, Marciano Settles. Rexine Cater, RPVI [facility] Radiology Electronically Signed   By: Myrlene Asper D.O.   On: 10/05/2023 15:08    Procedures Procedures    Medications Ordered in ED Medications  fentaNYL  (SUBLIMAZE ) injection 50 mcg (50 mcg Intramuscular Given 10/05/23 1938)  sodium chloride  0.9 % bolus 1,000 mL (0 mLs Intravenous Stopped 10/06/23 0004)  insulin  aspart (novoLOG ) injection 10 Units (10 Units Subcutaneous Given 10/05/23 2134)  iohexol  (OMNIPAQUE ) 350 MG/ML injection 75 mL (75 mLs Intravenous Contrast Given 10/05/23 2156)    ED Course/ Medical Decision Making/ A&P Clinical Course as of 10/06/23 1526  Fri [place] 23, 2025  2050 Discharged 5/13: "RUQ Intra-abdominal abscess causing duodenal/gastric outlet obstruction Patient presenting to ED with progressive abdominal pain associate with nausea and vomiting.  Patient was afebrile with elevated WBC count of 10.6. CT abdomen/pelvis with contrast with noted s/p cholecystectomy with 7.3 x 5.9 cm fluid collection with air-fluid levels gallbladder fossa which is enlarged prior to exam on 4/27 consistent with abscess or biloma, also 6.3 x 3.9 cm fluid-filled structure medial to this concerning for abscess or severely inflamed gastric antrum and proximal duodenum with mild/moderate gastric distention suggestive of gastric outlet obstruction.  General surgery was consulted and followed during hospital course.  Recent robotic cholecystectomy  by Dr. [name] on 09/05/2022.  Nuclear medicine hepatobiliary leak scan with no bile leak demonstrated, patent common bile duct.  Interventional radiology was consulted and patient underwent drain placement on 09/22/2023. Drain culture positive for Enterobacter cloacae, Enterococcus faecium.  Patient was treated with IV Zosyn  during hospitalization with improvement of symptoms.  Diet was slowly advanced with toleration.  Will discharge on Levaquin  and amoxicillin  in accordance with culture susceptibilities to complete 10-day antibiotic course.  Outpatient follow-up with general surgery and interventional radiology." [TY]  2118 EF in 2024 normal. [TY]  2225 CT CHEST ABDOMEN PELVIS W CONTRAST IMPRESSION: 1. Similar-appearing loculated right pleural effusion. No chest tube identified. 2. Cholecystostomy tube pigtail in the post cholecystectomy surgical bed with question developing 2 x 1.5 cm fluid collection with associated adjacent trace free fluid and gas. 3. Colonic diverticulosis with no acute diverticulitis. 4.  Aortic  Atherosclerosis (ICD10-I70.0).   Electronically Signed   By: Morgane  Naveau M.D.   On: 10/05/2023 22:20   [TY]    Clinical Course User Index [TY] Rolinda Climes, DO                                 Medical Decision Making Is a 78 year old male presenting emergency department for pain after having thoracentesis and right upper quadrant drain placed by IR today outpatient.  He is afebrile nontachycardic, slightly hypertensive.  Maintaining oxygen saturation on room air.  Received fentanyl  for analgesia with improvement of his pain.  Benign abdominal exam.  Labs with mild leukocytosis he is hyperglycemic, but does not appear to be in DKA.  Pseudohyponatremia secondary to his hyperglycemia.  Lipase is normal.  No transaminitis to suggest hepatobiliary disease.  T. bili normal.  His lactate is 1.6; acute bowel ischemia unlikely.  Given patient's complaint we will get CT scan to  evaluate for drain placement.  Given IV fluids and insulin  for his hyperglycemia.  Case discussed with IR regarding CT findings. Recommended flushing drain and can follow up outpatient. BS improving. Patient and family note inadequate pain control at home, will give short course of pain medications until they can follow up outpatient. Return precautions given.   Amount and/or Complexity of Data Reviewed Independent Historian:     Details: Family members at bedside to provide additional history.  Note thoracentesis today as well as chest tube pulled. Labs: ordered. Radiology:  Decision-making details documented in ED Course.  Risk Prescription drug management.         Final Clinical Impression(s) / ED Diagnoses Final diagnoses:  Biliary drain displacement, subsequent encounter    Rx / DC Orders ED Discharge Orders          Ordered    HYDROmorphone  (DILAUDID ) 2 MG tablet  Every 6 hours PRN        10/06/23 0016    HYDROmorphone  (DILAUDID ) 2 MG tablet  Every 6 hours PRN,   Status:  Discontinued        10/05/23 2347              Rolinda Climes, DO 10/06/23 1526

## 2023-10-05 NOTE — Discharge Instructions (Signed)
 You Schindel take over-the-counter Tylenol  every 8 hours for pain.  We are prescribing you oral narcotic pain medications as well.  Please use caution as they can make you sleepy and predispose you to falling.  Please follow-up with your primary doctor, surgeon, and interventional radiology.  Return immediately if develops fevers, chills, chest pain, shortness of breath, inability to eat or drink due to nausea vomiting, stops having bowel movements or any new or worsening symptoms that are concerning to you.

## 2023-10-06 LAB — BODY FLUID CULTURE W GRAM STAIN: Culture: NO GROWTH

## 2023-10-06 MED ORDER — HYDROMORPHONE HCL 2 MG PO TABS
1.0000 mg | ORAL_TABLET | Freq: Four times a day (QID) | ORAL | 0 refills | Status: DC | PRN
Start: 2023-10-06 — End: 2023-10-17

## 2023-10-06 NOTE — ED Notes (Signed)
 Flashed drain without difficulty.

## 2023-10-08 ENCOUNTER — Other Ambulatory Visit: Payer: Self-pay

## 2023-10-08 ENCOUNTER — Inpatient Hospital Stay (HOSPITAL_COMMUNITY)
Admission: EM | Admit: 2023-10-08 | Discharge: 2023-10-17 | DRG: 164 | Disposition: A | Discharge status: Home / Self Care | Attending: Internal Medicine | Admitting: Internal Medicine

## 2023-10-08 ENCOUNTER — Encounter (HOSPITAL_COMMUNITY): Payer: Self-pay

## 2023-10-08 ENCOUNTER — Emergency Department (HOSPITAL_COMMUNITY)

## 2023-10-08 DIAGNOSIS — E785 Hyperlipidemia, unspecified: Secondary | ICD-10-CM | POA: Diagnosis present

## 2023-10-08 DIAGNOSIS — D649 Anemia, unspecified: Secondary | ICD-10-CM | POA: Diagnosis present

## 2023-10-08 DIAGNOSIS — J90 Pleural effusion, not elsewhere classified: Principal | ICD-10-CM | POA: Diagnosis present

## 2023-10-08 DIAGNOSIS — Z8042 Family history of malignant neoplasm of prostate: Secondary | ICD-10-CM

## 2023-10-08 DIAGNOSIS — E6609 Other obesity due to excess calories: Secondary | ICD-10-CM

## 2023-10-08 DIAGNOSIS — H9190 Unspecified hearing loss, unspecified ear: Secondary | ICD-10-CM | POA: Diagnosis present

## 2023-10-08 DIAGNOSIS — E66811 Obesity, class 1: Secondary | ICD-10-CM

## 2023-10-08 DIAGNOSIS — Z888 Allergy status to other drugs, medicaments and biological substances status: Secondary | ICD-10-CM

## 2023-10-08 DIAGNOSIS — Z885 Allergy status to narcotic agent status: Secondary | ICD-10-CM

## 2023-10-08 DIAGNOSIS — J4489 Other specified chronic obstructive pulmonary disease: Secondary | ICD-10-CM | POA: Diagnosis not present

## 2023-10-08 DIAGNOSIS — Z794 Long term (current) use of insulin: Secondary | ICD-10-CM

## 2023-10-08 DIAGNOSIS — I2510 Atherosclerotic heart disease of native coronary artery without angina pectoris: Secondary | ICD-10-CM | POA: Diagnosis present

## 2023-10-08 DIAGNOSIS — I4891 Unspecified atrial fibrillation: Secondary | ICD-10-CM | POA: Diagnosis present

## 2023-10-08 DIAGNOSIS — K219 Gastro-esophageal reflux disease without esophagitis: Secondary | ICD-10-CM | POA: Diagnosis present

## 2023-10-08 DIAGNOSIS — Z9641 Presence of insulin pump (external) (internal): Secondary | ICD-10-CM | POA: Diagnosis present

## 2023-10-08 DIAGNOSIS — I10 Essential (primary) hypertension: Secondary | ICD-10-CM | POA: Diagnosis present

## 2023-10-08 DIAGNOSIS — R188 Other ascites: Secondary | ICD-10-CM

## 2023-10-08 DIAGNOSIS — E669 Obesity, unspecified: Secondary | ICD-10-CM | POA: Diagnosis present

## 2023-10-08 DIAGNOSIS — E876 Hypokalemia: Secondary | ICD-10-CM | POA: Diagnosis present

## 2023-10-08 DIAGNOSIS — E1065 Type 1 diabetes mellitus with hyperglycemia: Secondary | ICD-10-CM | POA: Diagnosis present

## 2023-10-08 DIAGNOSIS — E1169 Type 2 diabetes mellitus with other specified complication: Secondary | ICD-10-CM | POA: Diagnosis present

## 2023-10-08 DIAGNOSIS — Z7901 Long term (current) use of anticoagulants: Secondary | ICD-10-CM

## 2023-10-08 DIAGNOSIS — E871 Hypo-osmolality and hyponatremia: Secondary | ICD-10-CM | POA: Diagnosis present

## 2023-10-08 DIAGNOSIS — Z96653 Presence of artificial knee joint, bilateral: Secondary | ICD-10-CM | POA: Diagnosis present

## 2023-10-08 DIAGNOSIS — Z79899 Other long term (current) drug therapy: Secondary | ICD-10-CM

## 2023-10-08 DIAGNOSIS — E1069 Type 1 diabetes mellitus with other specified complication: Secondary | ICD-10-CM | POA: Diagnosis present

## 2023-10-08 DIAGNOSIS — J9811 Atelectasis: Secondary | ICD-10-CM | POA: Diagnosis present

## 2023-10-08 DIAGNOSIS — M25572 Pain in left ankle and joints of left foot: Secondary | ICD-10-CM | POA: Diagnosis present

## 2023-10-08 DIAGNOSIS — Z6829 Body mass index (BMI) 29.0-29.9, adult: Secondary | ICD-10-CM

## 2023-10-08 DIAGNOSIS — M0579 Rheumatoid arthritis with rheumatoid factor of multiple sites without organ or systems involvement: Secondary | ICD-10-CM | POA: Diagnosis present

## 2023-10-08 DIAGNOSIS — Z87891 Personal history of nicotine dependence: Secondary | ICD-10-CM

## 2023-10-08 LAB — CBC
HCT: 32.9 % — ABNORMAL LOW (ref 39.0–52.0)
Hemoglobin: 11.4 g/dL — ABNORMAL LOW (ref 13.0–17.0)
MCH: 29.8 pg (ref 26.0–34.0)
MCHC: 34.7 g/dL (ref 30.0–36.0)
MCV: 86.1 fL (ref 80.0–100.0)
Platelets: 405 10*3/uL — ABNORMAL HIGH (ref 150–400)
RBC: 3.82 MIL/uL — ABNORMAL LOW (ref 4.22–5.81)
RDW: 12.9 % (ref 11.5–15.5)
WBC: 12.9 10*3/uL — ABNORMAL HIGH (ref 4.0–10.5)
nRBC: 0 % (ref 0.0–0.2)

## 2023-10-08 LAB — BASIC METABOLIC PANEL WITH GFR
Anion gap: 11 (ref 5–15)
BUN: 8 mg/dL (ref 8–23)
CO2: 25 mmol/L (ref 22–32)
Calcium: 8.3 mg/dL — ABNORMAL LOW (ref 8.9–10.3)
Chloride: 95 mmol/L — ABNORMAL LOW (ref 98–111)
Creatinine, Ser: 0.97 mg/dL (ref 0.61–1.24)
GFR, Estimated: >60 mL/min (ref >60)
Glucose, Bld: 113 mg/dL — ABNORMAL HIGH (ref 70–99)
Potassium: 3.7 mmol/L (ref 3.5–5.1)
Sodium: 131 mmol/L — ABNORMAL LOW (ref 135–145)

## 2023-10-08 LAB — GLUCOSE, CAPILLARY
Glucose-Capillary: 82 mg/dL (ref 70–99)
Glucose-Capillary: 201 mg/dL — ABNORMAL HIGH (ref 70–99)

## 2023-10-08 MED ORDER — INSULIN PUMP
Freq: Three times a day (TID) | SUBCUTANEOUS | Status: DC
  Filled 2023-10-08: qty 1

## 2023-10-08 MED ORDER — APIXABAN 5 MG PO TABS: 5.0000 mg | ORAL_TABLET | Freq: Two times a day (BID) | ORAL | Status: DC

## 2023-10-08 MED ORDER — KETOROLAC TROMETHAMINE 15 MG/ML IJ SOLN: 15.0000 mg | Freq: Three times a day (TID) | INTRAMUSCULAR | Status: DC | PRN

## 2023-10-08 MED ORDER — SODIUM CHLORIDE 0.9% FLUSH
3.0000 mL | Freq: Two times a day (BID) | INTRAVENOUS | Status: DC
  Administered 2023-10-08 – 2023-10-17 (×17): 3 mL via INTRAVENOUS

## 2023-10-08 MED ORDER — FENTANYL CITRATE PF 50 MCG/ML IJ SOSY
50.0000 ug | PREFILLED_SYRINGE | Freq: Once | INTRAMUSCULAR | Status: AC
  Administered 2023-10-08: 50 ug via INTRAVENOUS
  Filled 2023-10-08: qty 1

## 2023-10-08 MED ORDER — TRAMADOL HCL 50 MG PO TABS
50.0000 mg | ORAL_TABLET | Freq: Four times a day (QID) | ORAL | Status: DC | PRN
  Administered 2023-10-08: 50 mg via ORAL
  Filled 2023-10-08: qty 1

## 2023-10-08 MED ORDER — ACETAMINOPHEN 325 MG PO TABS: 650.0000 mg | ORAL_TABLET | Freq: Four times a day (QID) | ORAL | Status: DC | PRN

## 2023-10-08 MED ORDER — PANTOPRAZOLE SODIUM 40 MG PO TBEC
40.0000 mg | DELAYED_RELEASE_TABLET | Freq: Every day | ORAL | Status: DC
  Administered 2023-10-08 – 2023-10-17 (×9): 40 mg via ORAL
  Filled 2023-10-08 (×9): qty 1

## 2023-10-08 MED ORDER — ALBUTEROL SULFATE (2.5 MG/3ML) 0.083% IN NEBU: 3.0000 mL | INHALATION_SOLUTION | Freq: Four times a day (QID) | RESPIRATORY_TRACT | Status: DC | PRN

## 2023-10-08 MED ORDER — AMLODIPINE BESYLATE 5 MG PO TABS
5.0000 mg | ORAL_TABLET | Freq: Every day | ORAL | Status: DC
  Administered 2023-10-09 – 2023-10-17 (×8): 5 mg via ORAL
  Filled 2023-10-08 (×9): qty 1

## 2023-10-08 MED ORDER — PRAVASTATIN SODIUM 40 MG PO TABS
40.0000 mg | ORAL_TABLET | Freq: Every day | ORAL | Status: DC
  Administered 2023-10-09 – 2023-10-17 (×8): 40 mg via ORAL
  Filled 2023-10-08 (×8): qty 1

## 2023-10-08 MED ORDER — POLYETHYLENE GLYCOL 3350 17 G PO PACK: 17.0000 g | PACK | Freq: Every day | ORAL | Status: DC | PRN

## 2023-10-08 MED ORDER — ACETAMINOPHEN 650 MG RE SUPP: 650.0000 mg | Freq: Four times a day (QID) | RECTAL | Status: DC | PRN

## 2023-10-08 MED ORDER — ENSURE ENLIVE PO LIQD
237.0000 mL | Freq: Two times a day (BID) | ORAL | Status: DC
  Administered 2023-10-11 (×2): 237 mL via ORAL
  Filled 2023-10-08 (×7): qty 237

## 2023-10-08 MED ORDER — TRAMADOL HCL 50 MG PO TABS
50.0000 mg | ORAL_TABLET | Freq: Four times a day (QID) | ORAL | Status: DC | PRN
  Administered 2023-10-09 – 2023-10-17 (×16): 50 mg via ORAL
  Filled 2023-10-08 (×16): qty 1

## 2023-10-08 MED ORDER — RAMIPRIL 5 MG PO CAPS
5.0000 mg | ORAL_CAPSULE | Freq: Every day | ORAL | Status: DC
  Administered 2023-10-09 – 2023-10-13 (×5): 5 mg via ORAL
  Filled 2023-10-08 (×6): qty 1

## 2023-10-08 MED ORDER — ATROPINE SULFATE 1 MG/10ML IJ SOSY: 0.5000 mg | PREFILLED_SYRINGE | INTRAMUSCULAR | Status: DC | PRN

## 2023-10-08 MED ORDER — FENTANYL CITRATE PF 50 MCG/ML IJ SOSY
25.0000 ug | PREFILLED_SYRINGE | Freq: Two times a day (BID) | INTRAMUSCULAR | Status: DC | PRN
  Administered 2023-10-08 – 2023-10-16 (×8): 25 ug via INTRAVENOUS
  Filled 2023-10-08 (×8): qty 1

## 2023-10-08 NOTE — Plan of Care (Signed)
   Problem: Clinical Measurements: Goal: Ability to maintain clinical measurements within normal limits will improve Outcome: Progressing

## 2023-10-08 NOTE — H&P (Addendum)
 History and Physical   Joel Rosenbloom Loudin Sr. UJW:[id number] DOB: 1946/03/18 DOA: 10/08/2023  PCP: [name], MD   Patient coming from: Home  Chief Complaint: Shortness of breath, chest pain  HPI: Joel Varden Maffia Sr. is a 78 y.o. male with medical history significant of hypertension, hyperlipidemia, diabetes, GERD, CAD, atrial fibrillation, bradycardia, obesity, RA, asthma presenting with recurrent shortness of breath and right-sided chest pain.  Patient has had records for the past couple of months.  Initially presented to [facility] and underwent cholecystectomy on 4/16.  He followed up in the ED there on 4/27 and a CT showed fluid collection.  General surgery at that time offered admission with HIDA scan but he declined.  Patient presented again on 5/9 with continued abdominal pain following.  CT at that time demonstrated large fluid collection and a second small fluid collection.  Likely causing gastric outlet obstruction.  With concern for abscess.  While admitted, patient underwent evaluation by general surgery and interventional radiology.  Ultimately IR placed a drain and cultures grew Enterobacter and Enterococcus species.  Patient responded to IV Zosyn  while inpatient.  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  Drain also reportedly positioned on 5/23.  Has undergone 2 thoracenteses for this.  3 presenting with symptoms similar to when his prior pleural effusions developed include shortness of breath worse on exertion and right-sided chest pain.  This has been going on for the past day at a time.  Denies fevers, chills, abdominal pain, constipation, diarrhea, nausea, vomiting.  ED Course: Vital signs in the ED notable for blood pressure in the 100s-110 systolic.  Lab workup included BMP with sodium 131, chloride 95,.  CBC with leukocytosis to 12.9, hemoglobin stable 11.4, platelets 452.   Patient received fentanyl  in ED.  Case discussed with interventional radiology agreed to consult on patient, likely tomorrow and admission.  Review of Systems: As per HPI otherwise all other systems reviewed and are negative.  Past Medical History:  Diagnosis Date   A-fib [facility])    AKI (acute kidney injury) (HCC) 05/18/2021   Anemia    Arthritis    "all over"   Bradycardia    Chronic bronchitis (HCC)    "get it just about q yr" (03/17/2014)   Complication of anesthesia    difficulty waking up   Daily headache    "here lately" (03/17/2014); relates to sinuses   GERD (gastroesophageal reflux disease)    Hard of hearing    hearing aides bilat   Hiatal hernia    History of blood transfusion 2008   "related to OR"   History of kidney stones    Hyperlipidemia    Hypertension    Pneumonia 1972 X 1   Rheumatoid arthritis (HCC)    Type II diabetes mellitus (HCC)    Wears glasses     Past Surgical History:  Procedure Laterality Date   ATRIAL FIBRILLATION ABLATION N/A 05/23/2023   Procedure: ATRIAL FIBRILLATION ABLATION;  Surgeon: [name], MD;  Location: [facility] INVASIVE CV LAB;  Service: Cardiovascular;  Laterality: N/A;   broken finger      surgical repaired left hand 2nd finger    CARDIOVERSION N/A 11/08/2022   Procedure: CARDIOVERSION;  Surgeon: [name], MD;  Location: [facility] INVASIVE CV LAB;  Service: Cardiovascular;  Laterality: N/A;   cyst removed      per left knee/posteriorly   CYSTECTOMY Left 2009   "  behind knee"   INGUINAL HERNIA REPAIR Right ?2010   IR CV LINE INJECTION  10/02/2023   IR THORACENTESIS ASP PLEURAL SPACE W/IMG GUIDE  10/03/2023   JOINT REPLACEMENT     KNEE ARTHROSCOPY Bilateral 1980's   LIVER BIOPSY N/A 09/05/2023   Procedure: BIOPSY, LIVER;  Surgeon: [name], MD;  Location: [facility] ORS;  Service: General;  Laterality: N/A;   PARTIAL KNEE ARTHROPLASTY Right 08/27/2015   Procedure: RIGHT UNICOMPARTMENTAL KNEE ARTHROPLASTY;  Surgeon: [name], MD;  Location: [facility] ORS;  Service: Orthopedics;  Laterality: Right;   REVISION TOTAL KNEE ARTHROPLASTY Left 2008   SHOULDER SURGERY Left 2019   TOTAL KNEE ARTHROPLASTY Left 2003    Social History  reports that he quit smoking about 55 years ago. His smoking use included cigarettes and cigars. He started smoking about 60 years ago. He has a 5 pack-year smoking history. He has never been exposed to tobacco smoke. He has never used smokeless tobacco. He reports that he does not drink alcohol and does not use drugs.  Allergies  Allergen Reactions   Codeine Swelling   Lipitor [Atorvastatin] Other (See Comments)    Muscle aches    Invokana [Canagliflozin] Other (See Comments)    Stomach upset    Losartan Nausea And Vomiting   Roxicodone  [Oxycodone ] Itching   Vicodin [Hydrocodone -Acetaminophen ] Itching    Family History  Problem Relation Age of Onset   Cancer Father        prostate  Reviewed on admission  Prior to Admission medications   Medication Sig Start Date End Date Taking? Authorizing Provider  Abatacept  (ORENCIA  IV) Inject 750 mg into the vein every 28 (twenty-eight) days.    [provider]  albuterol  (PROVENTIL ) (2.5 MG/3ML) 0.083% nebulizer solution Take 3 mLs (2.5 mg total) by nebulization every 6 (six) hours as needed for wheezing or shortness of breath. 10/02/23   [name], MD  albuterol  (VENTOLIN  HFA) 108 (90 Base) MCG/ACT inhaler Inhale 2 puffs into the lungs every 6 (six) hours as needed for wheezing or shortness of breath. 10/01/23   [name], MD  amLODipine  (NORVASC ) 5 MG tablet Take 1 tablet (5 mg total) by mouth daily. 09/04/23   [name], MD  apixaban  (ELIQUIS ) 5 MG TABS tablet Take 1 tablet (5 mg total) by mouth 2 (two) times daily. 07/16/23   Lenise Quince, MD  ascorbic acid  (VITAMIN C ) 500 MG tablet Take 500 mg by mouth daily.    [provider]  calcium carbonate (OSCAL) 1500 (600 Ca) MG TABS tablet Take 600 mg of  elemental calcium by mouth daily.    [provider]  cholecalciferol (VITAMIN D3) 25 MCG (1000 UNIT) tablet Take 1,000 Units by mouth daily.    [provider]  HYDROmorphone  (DILAUDID ) 2 MG tablet Take 0.5 tablets (1 mg total) by mouth every 6 (six) hours as needed for up to 3 days for severe pain (pain score 7-10). 10/06/23 10/09/23  Rolinda Climes, DO  Insulin  Aspart, w/Niacinamide, (FIASP ) 100 UNIT/ML SOLN 100 Units. Insulin  pump 09/27/22   [provider]  insulin  glargine (LANTUS ) 100 UNIT/ML injection Inject 16 Units into the skin as needed (If pump goes out).    [provider]  Insulin  Human (INSULIN  PUMP) SOLN Inject into the skin continuous. Fiasp  77 units    [provider]  KRILL OIL PO Take 400 mg by mouth daily.    [provider]  loratadine  (CLARITIN ) 10  MG tablet Take 10 mg by mouth daily.    [provider]  magic mouthwash (nystatin , lidocaine , diphenhydrAMINE , alum & mag hydroxide) suspension Swish and swallow 5 mLs 3 (three) times daily as needed for mouth pain. 09/26/23   [name], MD  methocarbamol  (ROBAXIN ) 500 MG tablet Take 1 tablet (500 mg total) by mouth every 12 (twelve) hours as needed for muscle spasms. 09/28/23   [name], MD  Multiple Vitamin (MULTIVITAMIN WITH MINERALS) TABS Take 1 tablet by mouth daily. Centrum    [provider]  ondansetron  (ZOFRAN -ODT) 4 MG disintegrating tablet Take 1 tablet (4 mg total) by mouth every 8 (eight) hours as needed for nausea or vomiting. 09/09/23   Ninetta Basket, MD  ONE TOUCH ULTRA TEST test strip 1 each daily. Pt says 2-3 times daily 05/26/16   [provider]  pantoprazole  (PROTONIX ) 40 MG tablet TAKE 1 TABLET BY MOUTH DAILY 06/27/23   Mealor, Augustus E, MD  Potassium 99 MG TABS Take 99 mg by mouth daily.    [provider]  pravastatin  (PRAVACHOL ) 40 MG tablet Take 40 mg by mouth daily.    [provider]   pyridOXINE (VITAMIN B6) 50 MG tablet Take 50 mg by mouth daily.    [provider]  ramipril  (ALTACE ) 5 MG capsule Take 1 capsule (5 mg total) by mouth daily. 09/04/23   [name], MD  sodium chloride  flush 0.9 % SOLN injection Use 1 flush as directed as needed 09/26/23 11/25/23  Carim, Charles A, PA-C  traMADol  (ULTRAM ) 50 MG tablet Take 1 tablet (50 mg total) by mouth every 6 (six) hours as needed for moderate pain (pain score 4-6). 09/25/23   [place], Rema Care, DO  vitamin E  400 UNIT capsule Take 400 Units by mouth in the morning.    [provider]  zinc gluconate 50 MG tablet Take 50 mg by mouth in the morning.    [provider]    Physical Exam: Vitals:   10/08/23 1445 10/08/23 1500 10/08/23 1515 10/08/23 1530  BP: (!) 116/53 (!) 113/54 111/61 114/62  Pulse: 63 61 65 65  Resp: 20 18 19 17   Temp:      TempSrc:      SpO2: 96% 94% 96% 97%  Weight:      Height:        Physical Exam Constitutional:      General: He is not in acute distress.    Appearance: Normal appearance. He is obese.  HENT:     Head: Normocephalic and atraumatic.     Mouth/Throat:     Mouth: Mucous membranes are moist.     Pharynx: Oropharynx is clear.  Eyes:     Extraocular Movements: Extraocular movements intact.     Pupils: Pupils are equal, round, and reactive to light.  Cardiovascular:     Rate and Rhythm: Normal rate and regular rhythm.     Pulses: Normal pulses.     Heart sounds: Normal heart sounds.  Pulmonary:     Effort: Pulmonary effort is normal. No respiratory distress.     Breath sounds: Normal breath sounds.  Abdominal:     General: Bowel sounds are normal. There is no distension.     Palpations: Abdomen is soft.     Tenderness: There is no abdominal tenderness.     Comments: Drain in place  Musculoskeletal:        General: No swelling or deformity.  Skin:  General: Skin is warm and dry.  Neurological:     General: No focal deficit present.      Mental Status: Mental status is at baseline.    Labs on Admission: I have personally reviewed following labs and imaging studies  CBC: Recent Labs  Lab 10/05/23 0905 10/05/23 1933 10/08/23 1306  WBC 9.4 10.7* 12.9*  NEUTROABS  --  8.4*  --   HGB 12.4* 12.3* 11.4*  HCT 35.7* 36.9* 32.9*  MCV 86.7 87.6 86.1  PLT 473* 450* 405*    Basic Metabolic Panel: Recent Labs  Lab 10/05/23 1933 10/08/23 1306  NA 127* 131*  K 5.1 3.7  CL 92* 95*  CO2 22 25  GLUCOSE 512* 113*  BUN 16 8  CREATININE 1.22 0.97  CALCIUM 8.3* 8.3*    GFR: Estimated Creatinine Clearance: 65 mL/min (by C-G formula based on SCr of 0.97 mg/dL).  Liver Function Tests: Recent Labs  Lab 10/05/23 1933  AST 44*  ALT 32  ALKPHOS 94  BILITOT 0.8  PROT 6.2*  ALBUMIN 2.5*    Urine analysis:    Component Value Date/Time   COLORURINE YELLOW 10/05/2023 2115   APPEARANCEUR CLEAR 10/05/2023 2115   LABSPEC 1.030 10/05/2023 2115   PHURINE 5.0 10/05/2023 2115   GLUCOSEU >=500 (A) 10/05/2023 2115   HGBUR NEGATIVE 10/05/2023 2115   BILIRUBINUR NEGATIVE 10/05/2023 2115   KETONESUR 20 (A) 10/05/2023 2115   PROTEINUR NEGATIVE 10/05/2023 2115   UROBILINOGEN 0.2 01/13/2010 0951   NITRITE NEGATIVE 10/05/2023 2115   LEUKOCYTESUR NEGATIVE 10/05/2023 2115    Radiological Exams on Admission: DG Chest 2 View Result Date: 10/08/2023 EXAM: 2 VIEW(S) XRAY OF THE CHEST 10/08/2023 02:27:00 PM COMPARISON: 1 view chest x-ray 10/05/2023 CT of the chest 10/05/2023. CLINICAL HISTORY: Shortness of breath. SOB, hx of bradycardia, hiatal hernia. FINDINGS: LUNGS AND PLEURA: Progressive loculated right pleural effusion and right lower lobe airspace disease are present. Small left pleural effusion and atelectasis are now present. The lung volumes are low. HEART AND MEDIASTINUM: No acute abnormality of the cardiac and mediastinal silhouettes. BONES AND SOFT TISSUES: No acute osseous abnormality. IMPRESSION: 1. Progressive loculated  right pleural effusion and right lower lobe airspace disease. 2. New small left pleural effusion and atelectasis. 3. Low lung volumes. Electronically signed by: Audree Leas MD 10/08/2023 02:38 PM EDT RP Workstation: [id number]   EKG: Independently reviewed.  Sinus rhythm at 73 beats minute.  Nonspecific T wave flattening.  Low voltage multiple leads.  PVC noted.  Assessment/Plan Principal Problem:   Recurrent pleural effusion on right Active Problems:   CAD (coronary artery disease)   Essential hypertension   Rheumatoid arthritis involving multiple sites with positive rheumatoid factor (HCC)   Gastroesophageal reflux disease   Type 2 diabetes mellitus with other specified complication (HCC)   Obesity   Asthmatic bronchitis , chronic (HCC)   Atrial fibrillation (HCC)   Hyperlipidemia associated with type 2 diabetes mellitus (HCC)   Recurrent right pleural effusion Status post cholecystectomy Intra-abdominal abscess status post drain placement > As per HPI has recurrent right-sided pleural effusion secondary to biliary leak diaphragmatic fistula in the setting of drain placement for abdominal abscess following cholecystectomy. > Recurrent symptoms Imaging showing recurrent right-sided pleural effusion again today. > Consulted and will see the patient tomorrow concern for frequency of recurrence. > Noted to have leukocytosis, this could be reactive but will need to monitor for fevers or other signs of infections in etiology antibiotics.  Will benefit from oral fluid.  Monitor on telemetry overnight > Cultures from thoracentesis on 5/21 showed no growth to date.  Bilirubin added on to 5/23 labs is in process, will need to speak with lab about this. - Trend fever curve and WBC - Supple oxygen as needed - Appreciate IR recommendations and assistance - Will need further labs on pleural fluid - As needed pain medication - Hold Eliquis  tonight in case for significant procedures needed  tomorrow/this week  Hypertension - Continue home amlodipine  and ramipril   Hyperlipidemia - Continue atorvastatin  Diabetes - Continue insulin  pump  GERD - Continue PPI  CAD - Continue home ramipril , Eliquis   Atrial fibrillation - Holding Eliquis  for now  Obesity - Noted  Rheumatoid arthritis - On abatacept  outpatient  Asthma - Continue home as needed albuterol   DVT prophylaxis: Holding Eliquis  tonight, SCDs. Code Status:   Full Family Communication:  Updated at bedside  Disposition Plan:   Patient is from:  Home  Anticipated DC to:  Home  Anticipated DC date:  1 to 3 days  Anticipated DC barriers: None  Consults called:  Interventional radiology Admission status:  Observation, telemetry  Severity of Illness: The appropriate patient status for this patient is OBSERVATION. Observation status is judged to be reasonable and necessary in order to provide the required intensity of service to ensure the patient's safety. The patient's presenting symptoms, physical exam findings, and initial radiographic and laboratory data in the context of their medical condition is felt to place them at decreased risk for further clinical deterioration. Furthermore, it is anticipated that the patient will be medically stable for discharge from the hospital within 2 midnights of admission.    Johnetta Nab MD [facility]  How to contact the [facility] Attending or Consulting provider 7A - 7P or covering provider during after hours 7P -7A, for this patient?   Check the care team in [facility] and look for a) attending/consulting [facility] provider listed and b) the [facility] team listed Log into [email or web address] and use 's universal password to access. If you do not have the password, please contact the hospital operator. Locate the [facility] provider you are looking for under [facility] and page to a number that you can be directly reached. If you still have difficulty reaching the provider,  please page the [facility] (Director on Call) for the Hospitalists listed on amion for assistance.  10/08/2023, 3:55 PM

## 2023-10-08 NOTE — ED Triage Notes (Addendum)
 Pt had gallbladder surgery 3 weeks ago and had 2nd drain placed. Pt states drain is not draining. C/O difficulty breathing. 92% on room air. C/O SHOB and right sided CP. Denies back and abd pain

## 2023-10-08 NOTE — ED Provider Notes (Signed)
 [place] EMERGENCY DEPARTMENT AT [facility] Provider Note   CSN: [id number] Arrival date & time: 10/08/23  1245     History  Chief Complaint  Patient presents with   Shortness of Breath    Joel Patches Eckford Sr. is a 78 y.o. male with PMHx afib, anemia, OA, COPD, GERD, HLD, HTN, DM who presents to ED concerned with DOE. Patient with complication from gallbladder drain a couple of weeks ago where his diaphragm was nicked and causing right sided pleural effusions s/p multiple thoracentesis by IR over the past week.   Family members stating that 1100cc was drained last Tuesday and patient also had 550cc drained last Friday. Family members stating that patient has been having increased DOE and right sided chest pain since late yesterday which is what has been happening when his pleural effusion starts building up again.   Denies fever, nausea, vomiting, diarrhea.    Shortness of Breath      Home Medications Prior to Admission medications   Medication Sig Start Date End Date Taking? Authorizing Provider  Abatacept  (ORENCIA  IV) Inject 750 mg into the vein every 28 (twenty-eight) days.    [provider]  albuterol  (PROVENTIL ) (2.5 MG/3ML) 0.083% nebulizer solution Take 3 mLs (2.5 mg total) by nebulization every 6 (six) hours as needed for wheezing or shortness of breath. 10/02/23   [name], MD  albuterol  (VENTOLIN  HFA) 108 (90 Base) MCG/ACT inhaler Inhale 2 puffs into the lungs every 6 (six) hours as needed for wheezing or shortness of breath. 10/01/23   [name], MD  amLODipine  (NORVASC ) 5 MG tablet Take 1 tablet (5 mg total) by mouth daily. 09/04/23   [name], MD  apixaban  (ELIQUIS ) 5 MG TABS tablet Take 1 tablet (5 mg total) by mouth 2 (two) times daily. 07/16/23   Lenise Quince, MD  ascorbic acid  (VITAMIN C ) 500 MG tablet Take 500 mg by mouth daily.    [provider]  calcium carbonate (OSCAL) 1500 (600 Ca) MG TABS tablet Take 600 mg  of elemental calcium by mouth daily.    [provider]  cholecalciferol (VITAMIN D3) 25 MCG (1000 UNIT) tablet Take 1,000 Units by mouth daily.    [provider]  HYDROmorphone  (DILAUDID ) 2 MG tablet Take 0.5 tablets (1 mg total) by mouth every 6 (six) hours as needed for up to 3 days for severe pain (pain score 7-10). 10/06/23 10/09/23  Rolinda Climes, DO  Insulin  Aspart, w/Niacinamide, (FIASP ) 100 UNIT/ML SOLN 100 Units. Insulin  pump 09/27/22   [provider]  insulin  glargine (LANTUS ) 100 UNIT/ML injection Inject 16 Units into the skin as needed (If pump goes out).    [provider]  Insulin  Human (INSULIN  PUMP) SOLN Inject into the skin continuous. Fiasp  77 units    [provider]  KRILL OIL PO Take 400 mg by mouth daily.    [provider]  loratadine  (CLARITIN ) 10 MG tablet Take 10 mg by mouth daily.    [provider]  magic mouthwash (nystatin , lidocaine , diphenhydrAMINE , alum & mag hydroxide) suspension Swish and swallow 5 mLs 3 (three) times daily as needed for mouth pain. 09/26/23   [name], MD  methocarbamol  (ROBAXIN ) 500 MG tablet Take 1 tablet (500 mg total) by mouth every 12 (twelve) hours as needed for muscle spasms. 09/28/23   [name], MD  Multiple Vitamin (MULTIVITAMIN WITH MINERALS) TABS Take 1 tablet by mouth daily. Centrum  [provider]  ondansetron  (ZOFRAN -ODT) 4 MG disintegrating tablet Take 1 tablet (4 mg total) by mouth every 8 (eight) hours as needed for nausea or vomiting. 09/09/23   Ninetta Basket, MD  ONE TOUCH ULTRA TEST test strip 1 each daily. Pt says 2-3 times daily 05/26/16   [provider]  pantoprazole  (PROTONIX ) 40 MG tablet TAKE 1 TABLET BY MOUTH DAILY 06/27/23   Mealor, Augustus E, MD  Potassium 99 MG TABS Take 99 mg by mouth daily.    [provider]  pravastatin  (PRAVACHOL ) 40 MG tablet Take 40 mg by mouth daily.    [provider]   pyridOXINE (VITAMIN B6) 50 MG tablet Take 50 mg by mouth daily.    [provider]  ramipril  (ALTACE ) 5 MG capsule Take 1 capsule (5 mg total) by mouth daily. 09/04/23   [name], MD  sodium chloride  flush 0.9 % SOLN injection Use 1 flush as directed as needed 09/26/23 11/25/23  Carim, Gordy Lauber, PA-C  traMADol  (ULTRAM ) 50 MG tablet Take 1 tablet (50 mg total) by mouth every 6 (six) hours as needed for moderate pain (pain score 4-6). 09/25/23   [place], Rema Care, DO  vitamin E  400 UNIT capsule Take 400 Units by mouth in the morning.    [provider]  zinc gluconate 50 MG tablet Take 50 mg by mouth in the morning.    [provider]      Allergies    Codeine, Lipitor [atorvastatin], Invokana [canagliflozin], Losartan, Roxicodone  [oxycodone ], and Vicodin [hydrocodone -acetaminophen ]    Review of Systems   Review of Systems  Respiratory:  Positive for shortness of breath.     Physical Exam Updated Vital Signs BP (!) 113/54   Pulse 61   Temp 98.2 F (36.8 C) (Oral)   Resp 18   Ht 5\' 6"  (1.676 m)   Wt 81.6 kg   SpO2 94%   BMI 29.05 kg/m  Physical Exam Vitals and nursing note reviewed.  Constitutional:      General: He is not in acute distress.    Appearance: He is not ill-appearing or toxic-appearing.  HENT:     Head: Normocephalic and atraumatic.     Mouth/Throat:     Mouth: Mucous membranes are moist.     Pharynx: No oropharyngeal exudate or posterior oropharyngeal erythema.  Eyes:     General: No scleral icterus.       Right eye: No discharge.        Left eye: No discharge.     Conjunctiva/sclera: Conjunctivae normal.  Cardiovascular:     Rate and Rhythm: Normal rate and regular rhythm.     Pulses: Normal pulses.     Heart sounds: Normal heart sounds. No murmur heard. Pulmonary:     Effort: Pulmonary effort is normal. No respiratory distress.     Breath sounds: Normal breath sounds. No wheezing, rhonchi or rales.  Abdominal:     General:  Bowel sounds are normal.     Palpations: Abdomen is soft.     Tenderness: There is no abdominal tenderness.     Comments: RUQ drain site without surrounding erythema or swelling  Musculoskeletal:     Right lower leg: No edema.     Left lower leg: No edema.  Skin:    General: Skin is warm and dry.     Findings: No rash.  Neurological:     General: No focal deficit present.     Mental Status: He  is alert. Mental status is at baseline.  Psychiatric:        Mood and Affect: Mood normal.     ED Results / Procedures / Treatments   Labs (all labs ordered are listed, but only abnormal results are displayed) Labs Reviewed  BASIC METABOLIC PANEL WITH GFR - Abnormal; Notable for the following components:      Result Value   Sodium 131 (*)    Chloride 95 (*)    Glucose, Bld 113 (*)    Calcium 8.3 (*)    All other components within normal limits  CBC - Abnormal; Notable for the following components:   WBC 12.9 (*)    RBC 3.82 (*)    Hemoglobin 11.4 (*)    HCT 32.9 (*)    Platelets 405 (*)    All other components within normal limits    EKG None  Radiology DG Chest 2 View Result Date: 10/08/2023 EXAM: 2 VIEW(S) XRAY OF THE CHEST 10/08/2023 02:27:00 PM COMPARISON: 1 view chest x-ray 10/05/2023 CT of the chest 10/05/2023. CLINICAL HISTORY: Shortness of breath. SOB, hx of bradycardia, hiatal hernia. FINDINGS: LUNGS AND PLEURA: Progressive loculated right pleural effusion and right lower lobe airspace disease are present. Small left pleural effusion and atelectasis are now present. The lung volumes are low. HEART AND MEDIASTINUM: No acute abnormality of the cardiac and mediastinal silhouettes. BONES AND SOFT TISSUES: No acute osseous abnormality. IMPRESSION: 1. Progressive loculated right pleural effusion and right lower lobe airspace disease. 2. New small left pleural effusion and atelectasis. 3. Low lung volumes. Electronically signed by: Audree Leas MD 10/08/2023 02:38 PM EDT RP  Workstation: [id number]    Procedures .Critical Care  Performed by: [place] Bureau, PA-C Authorized by: [place] Bureau, PA-C   Critical care provider statement:    Critical care time (minutes):  30   Critical care was time spent personally by me on the following activities:  Development of treatment plan with patient or surrogate, discussions with consultants, evaluation of patient's response to treatment, examination of patient, ordering and review of laboratory studies, ordering and review of radiographic studies, ordering and performing treatments and interventions, pulse oximetry, re-evaluation of patient's condition and review of old charts   Care discussed with: admitting provider   Comments:     Recurrent pleural effusion requiring IR and hospitalist consultation     Medications Ordered in ED Medications  fentaNYL  (SUBLIMAZE ) injection 50 mcg (50 mcg Intravenous Given 10/08/23 1407)    ED Course/ Medical Decision Making/ A&P                                 Medical Decision Making Amount and/or Complexity of Data Reviewed Labs: ordered. Radiology: ordered.  Risk Prescription drug management.   This patient presents to the ED for concern of shortness of breath, this involves an extensive number of treatment options, and is a complaint that carries with it a high risk of complications and morbidity.  The differential diagnosis includes Anxiety, Anaphylaxis/Angioedema, Aspirated FB, Arrhythmia, CHF, Asthma, COPD, PNA, COVID/Flu/RSV, STEMI, Tamponade, TPNX, Sepsis   Co morbidities that complicate the patient evaluation  afib, anemia, OA, COPD, GERD, HLD, HTN, DM   Additional history obtained:  Please see recent IR notes from this past week   Problem List / ED Course / Critical interventions / Medication management  Patient presents to ED concerned for worsening pleural effusion which she thinks is the  reason for his increasing DOE and right-sided chest  pain. I Ordered, and personally interpreted labs.  CBC with leukocytosis of 12.9.  There is also anemia with hemoglobin 11.4.  BMP with mild hyponatremia 131.  Chloride is also mildly low at 95.  Glucose slightly elevated at 113. I ordered imaging studies including chest xray to assess for process contributing to patient's symptoms. I independently visualized and interpreted imaging which showed progressive loculated right pleural effusion and right lower lobe airspace disease. I agree with the radiologist interpretation I requested consultation with the IR provider on-call Dr. Mabel Savage,  and discussed lab and imaging findings as well as pertinent plan - they recommend: hospital admission for recurrent pleural effusion. I have reviewed the patients home medicines and have made adjustments as needed Dr. Rufina Cough admitting provider.    Social Determinants of Health:  none          Final Clinical Impression(s) / ED Diagnoses Final diagnoses:  Pleural effusion    Rx / DC Orders ED Discharge Orders     None         [place] Bureau, [place] 10/08/23 1559    Nicklas Barns, MD 10/10/23 [phone]

## 2023-10-09 ENCOUNTER — Inpatient Hospital Stay (HOSPITAL_COMMUNITY)

## 2023-10-09 DIAGNOSIS — E785 Hyperlipidemia, unspecified: Secondary | ICD-10-CM | POA: Diagnosis present

## 2023-10-09 DIAGNOSIS — Z794 Long term (current) use of insulin: Secondary | ICD-10-CM | POA: Diagnosis not present

## 2023-10-09 DIAGNOSIS — E1069 Type 1 diabetes mellitus with other specified complication: Secondary | ICD-10-CM | POA: Diagnosis present

## 2023-10-09 DIAGNOSIS — E669 Obesity, unspecified: Secondary | ICD-10-CM | POA: Diagnosis present

## 2023-10-09 DIAGNOSIS — J4489 Other specified chronic obstructive pulmonary disease: Secondary | ICD-10-CM | POA: Diagnosis present

## 2023-10-09 DIAGNOSIS — D649 Anemia, unspecified: Secondary | ICD-10-CM | POA: Diagnosis present

## 2023-10-09 DIAGNOSIS — E119 Type 2 diabetes mellitus without complications: Secondary | ICD-10-CM | POA: Diagnosis not present

## 2023-10-09 DIAGNOSIS — E871 Hypo-osmolality and hyponatremia: Secondary | ICD-10-CM | POA: Diagnosis present

## 2023-10-09 DIAGNOSIS — Z6829 Body mass index (BMI) 29.0-29.9, adult: Secondary | ICD-10-CM | POA: Diagnosis not present

## 2023-10-09 DIAGNOSIS — I10 Essential (primary) hypertension: Secondary | ICD-10-CM | POA: Diagnosis present

## 2023-10-09 DIAGNOSIS — I4891 Unspecified atrial fibrillation: Secondary | ICD-10-CM | POA: Diagnosis present

## 2023-10-09 DIAGNOSIS — M25572 Pain in left ankle and joints of left foot: Secondary | ICD-10-CM | POA: Diagnosis present

## 2023-10-09 DIAGNOSIS — E876 Hypokalemia: Secondary | ICD-10-CM | POA: Diagnosis present

## 2023-10-09 DIAGNOSIS — J90 Pleural effusion, not elsewhere classified: Secondary | ICD-10-CM | POA: Diagnosis present

## 2023-10-09 DIAGNOSIS — K219 Gastro-esophageal reflux disease without esophagitis: Secondary | ICD-10-CM | POA: Diagnosis present

## 2023-10-09 DIAGNOSIS — Z9641 Presence of insulin pump (external) (internal): Secondary | ICD-10-CM | POA: Diagnosis present

## 2023-10-09 DIAGNOSIS — M0579 Rheumatoid arthritis with rheumatoid factor of multiple sites without organ or systems involvement: Secondary | ICD-10-CM | POA: Diagnosis present

## 2023-10-09 DIAGNOSIS — I2510 Atherosclerotic heart disease of native coronary artery without angina pectoris: Secondary | ICD-10-CM | POA: Diagnosis present

## 2023-10-09 DIAGNOSIS — Z87891 Personal history of nicotine dependence: Secondary | ICD-10-CM | POA: Diagnosis not present

## 2023-10-09 DIAGNOSIS — E1065 Type 1 diabetes mellitus with hyperglycemia: Secondary | ICD-10-CM | POA: Diagnosis present

## 2023-10-09 DIAGNOSIS — Z885 Allergy status to narcotic agent status: Secondary | ICD-10-CM | POA: Diagnosis not present

## 2023-10-09 DIAGNOSIS — Z96653 Presence of artificial knee joint, bilateral: Secondary | ICD-10-CM | POA: Diagnosis present

## 2023-10-09 DIAGNOSIS — J9811 Atelectasis: Secondary | ICD-10-CM | POA: Diagnosis present

## 2023-10-09 DIAGNOSIS — Z7901 Long term (current) use of anticoagulants: Secondary | ICD-10-CM | POA: Diagnosis not present

## 2023-10-09 DIAGNOSIS — Z79899 Other long term (current) drug therapy: Secondary | ICD-10-CM | POA: Diagnosis not present

## 2023-10-09 LAB — CBC
HCT: 30.9 % — ABNORMAL LOW (ref 39.0–52.0)
Hemoglobin: 10.5 g/dL — ABNORMAL LOW (ref 13.0–17.0)
MCH: 28.9 pg (ref 26.0–34.0)
MCHC: 34 g/dL (ref 30.0–36.0)
MCV: 85.1 fL (ref 80.0–100.0)
Platelets: 389 10*3/uL (ref 150–400)
RBC: 3.63 MIL/uL — ABNORMAL LOW (ref 4.22–5.81)
RDW: 13 % (ref 11.5–15.5)
WBC: 11 10*3/uL — ABNORMAL HIGH (ref 4.0–10.5)
nRBC: 0 % (ref 0.0–0.2)

## 2023-10-09 LAB — COMPREHENSIVE METABOLIC PANEL WITH GFR
ALT: 19 U/L (ref 0–44)
AST: 22 U/L (ref 15–41)
Albumin: 1.9 g/dL — ABNORMAL LOW (ref 3.5–5.0)
Alkaline Phosphatase: 93 U/L (ref 38–126)
Anion gap: 11 (ref 5–15)
BUN: 10 mg/dL (ref 8–23)
CO2: 25 mmol/L (ref 22–32)
Calcium: 8.2 mg/dL — ABNORMAL LOW (ref 8.9–10.3)
Chloride: 94 mmol/L — ABNORMAL LOW (ref 98–111)
Creatinine, Ser: 0.93 mg/dL (ref 0.61–1.24)
GFR, Estimated: >60 mL/min (ref >60)
Glucose, Bld: 107 mg/dL — ABNORMAL HIGH (ref 70–99)
Potassium: 3.6 mmol/L (ref 3.5–5.1)
Sodium: 130 mmol/L — ABNORMAL LOW (ref 135–145)
Total Bilirubin: 0.5 mg/dL (ref 0.0–1.2)
Total Protein: 5.5 g/dL — ABNORMAL LOW (ref 6.5–8.1)

## 2023-10-09 LAB — URIC ACID: Uric Acid, Serum: 2.8 mg/dL — ABNORMAL LOW (ref 3.7–8.6)

## 2023-10-09 LAB — GLUCOSE, CAPILLARY
Glucose-Capillary: 125 mg/dL — ABNORMAL HIGH (ref 70–99)
Glucose-Capillary: 126 mg/dL — ABNORMAL HIGH (ref 70–99)
Glucose-Capillary: 154 mg/dL — ABNORMAL HIGH (ref 70–99)

## 2023-10-09 MED ORDER — COLCHICINE 0.6 MG PO TABS
1.2000 mg | ORAL_TABLET | Freq: Once | ORAL | Status: AC
  Administered 2023-10-09: 1.2 mg via ORAL
  Filled 2023-10-09: qty 2

## 2023-10-09 MED ORDER — COLCHICINE 0.6 MG PO TABS
1.2000 mg | ORAL_TABLET | Freq: Once | ORAL | Status: DC
  Filled 2023-10-09: qty 2

## 2023-10-09 MED ORDER — LIDOCAINE HCL 1 % IJ SOLN
INTRAMUSCULAR | Status: AC
  Filled 2023-10-09: qty 20

## 2023-10-09 MED ORDER — PREDNISONE 10 MG PO TABS: 10.0000 mg | ORAL_TABLET | Freq: Every day | ORAL | Status: DC

## 2023-10-09 MED ORDER — COLCHICINE 0.6 MG PO TABS
0.6000 mg | ORAL_TABLET | Freq: Once | ORAL | Status: AC
  Administered 2023-10-09: 0.6 mg via ORAL
  Filled 2023-10-09: qty 1

## 2023-10-09 MED ORDER — COLCHICINE 0.6 MG PO TABS: 0.6000 mg | ORAL_TABLET | Freq: Every day | ORAL | Status: DC

## 2023-10-09 MED ORDER — COLCHICINE 0.6 MG PO TABS
0.6000 mg | ORAL_TABLET | Freq: Once | ORAL | Status: DC
  Filled 2023-10-09: qty 1

## 2023-10-09 MED ORDER — PANTOPRAZOLE SODIUM 40 MG PO TBEC
40.0000 mg | DELAYED_RELEASE_TABLET | Freq: Every day | ORAL | Status: DC
Start: 2023-10-09 — End: 2023-10-09

## 2023-10-09 MED ORDER — IOHEXOL 350 MG/ML SOLN
75.0000 mL | Freq: Once | INTRAVENOUS | Status: AC | PRN
  Administered 2023-10-09: 75 mL via INTRAVENOUS

## 2023-10-09 NOTE — Consult Note (Addendum)
 Chief Complaint: Patient was seen in consultation today for recurrent right pleural effusion after GB fossa drain placement  Chief Complaint  Patient presents with   Shortness of Breath   at the request of Pokhrel,Laxman TRH  Referring Physician(s): Pokhrel,Laxman TRH  Supervising Physician: Elene Griffes  Patient Status: [facility] - In-pt  History of Present Illness: Joel Schiller Mauzy Sr. is a 78 y.o. male with PMHs listed below, well known to IR for:  Patient is 78 yo male s/p cholecystectomy 09/05/23, HIDA scan on 09/21/23 showed no leak, but CT showed GB fossa abscess, he ultimately underwent  GB fossa abscess  drain on 5/10. Cx showed enterobacter cloacae and enterococcus faecium.   He was being followed by IR for the GB fossa drain, drain injection at the drain clinic on 5/20 showed possible perihepatic fluid collection/biloma, CT A/P on 5/21 showed GB fossa fluid has resolved  but pt had  right pleural effusion. Thora on 5/21 removed 1.1 L of dark red fluid.   Patient presented to IR on 5/23 as out pt and underwent the GB fossa drain replacement with Dr. Mabel Savage, per Dr. Benito Bran dictation it says "Drain injection demonstrates communication with the pleural effusion, most likely bilious pleural fluid. Also there is evidence of a bile leak into accessory right-sided bile duct." Later the same day patient presented to ED with decreased output from the GB fossa drain, CT C/A/P in ED showed right pleural effusion. EDP consulted IR who recommended outpt f/u for the GB fossa drain, patient was discharged home from ED. Of note, IR has been sending the right pleural fluid for total bilirubin, but the lab order has been canceled for unknown reason.  Patient presented to ED again on 5/26 with CC of SOB, IR was consulted for GB fossa drain/recurrent right pleural effusion eval and possible interventions. Case reviewed with Dr. Julietta Ogren, the plan was to perform right thoracentesis and send the fluid for bilirubin  today, however bilateral pleural space were visualized which showed no pleural effusion that has window for safe US  guided thoracentesis. Dr. Julietta Ogren recommended CT C/A/P, followed by GB fossa drain injection to see if there is connection between biliary system and the right pleural space.  If there is no connection, pulm consult for possible chest tube and intra pleural tpa instillation for loculated pleural effusion seen on CT 5/23. If drain injection shows clear connection between the biliary system and the right pleural space, patient will need GI consult for possible eval and intervention for possible ERCP with interventions for  bile leak after cholecystectomy. Patient and his family members were informed that he will go to CT and the will ne NPO at [facility] for possible chest tube placement tomorrow.   Patient's complaint today is SOB and LEFT sided chest pain.  The pt and family states that the GB foss drain has not been draining for days.       Past Medical History:  Diagnosis Date   A-fib [facility])    AKI (acute kidney injury) (HCC) 05/18/2021   Anemia    Arthritis    "all over"   Bradycardia    Chronic bronchitis (HCC)    "get it just about q yr" (03/17/2014)   Complication of anesthesia    difficulty waking up   Daily headache    "here lately" (03/17/2014); relates to sinuses   GERD (gastroesophageal reflux disease)    Hard of hearing    hearing aides bilat   Hiatal hernia  History of blood transfusion 2008   "related to OR"   History of kidney stones    Hyperlipidemia    Hypertension    Pneumonia 1972 X 1   Rheumatoid arthritis (HCC)    Type II diabetes mellitus (HCC)    Wears glasses     Past Surgical History:  Procedure Laterality Date   ATRIAL FIBRILLATION ABLATION N/A 05/23/2023   Procedure: ATRIAL FIBRILLATION ABLATION;  Surgeon: [name], MD;  Location: [facility] INVASIVE CV LAB;  Service: Cardiovascular;  Laterality: N/A;   broken finger      surgical repaired left  hand 2nd finger    CARDIOVERSION N/A 11/08/2022   Procedure: CARDIOVERSION;  Surgeon: [name], MD;  Location: [facility] INVASIVE CV LAB;  Service: Cardiovascular;  Laterality: N/A;   cyst removed      per left knee/posteriorly   CYSTECTOMY Left 2009   "behind knee"   INGUINAL HERNIA REPAIR Right ?2010   IR CV LINE INJECTION  10/02/2023   IR THORACENTESIS ASP PLEURAL SPACE W/IMG GUIDE  10/03/2023   JOINT REPLACEMENT     KNEE ARTHROSCOPY Bilateral 1980's   LIVER BIOPSY N/A 09/05/2023   Procedure: BIOPSY, LIVER;  Surgeon: [name], MD;  Location: [facility] ORS;  Service: General;  Laterality: N/A;   PARTIAL KNEE ARTHROPLASTY Right 08/27/2015   Procedure: RIGHT UNICOMPARTMENTAL KNEE ARTHROPLASTY;  Surgeon: [name], MD;  Location: [facility] ORS;  Service: Orthopedics;  Laterality: Right;   REVISION TOTAL KNEE ARTHROPLASTY Left 2008   SHOULDER SURGERY Left 2019   TOTAL KNEE ARTHROPLASTY Left 2003    Allergies: Codeine, Lipitor [atorvastatin], Invokana [canagliflozin], Losartan, Roxicodone  [oxycodone ], and Vicodin [hydrocodone -acetaminophen ]  Medications: Prior to Admission medications   Medication Sig Start Date End Date Taking? Authorizing Provider  albuterol  (PROVENTIL ) (2.5 MG/3ML) 0.083% nebulizer solution Take 3 mLs (2.5 mg total) by nebulization every 6 (six) hours as needed for wheezing or shortness of breath. 10/02/23  Yes [name], MD  amLODipine  (NORVASC ) 5 MG tablet Take 1 tablet (5 mg total) by mouth daily. Patient taking differently: Take 5 mg by mouth in the morning. 09/04/23  Yes [name], MD  amoxicillin -clavulanate (AUGMENTIN ) 875-125 MG tablet Take 1 tablet by mouth 2 (two) times daily.   Yes [provider]  apixaban  (ELIQUIS ) 5 MG TABS tablet Take 1 tablet (5 mg total) by mouth 2 (two) times daily. 07/16/23  Yes Lenise Quince, MD  ascorbic acid  (VITAMIN C ) 500 MG tablet Take 500 mg by mouth in the morning.   Yes [provider]   calcium carbonate (OSCAL) 1500 (600 Ca) MG TABS tablet Take 600 mg of elemental calcium by mouth in the morning.   Yes [provider]  calcium carbonate (TUMS - DOSED IN MG ELEMENTAL CALCIUM) 500 MG chewable tablet Chew 1 tablet by mouth as needed for indigestion or heartburn.   Yes [provider]  cholecalciferol (VITAMIN D3) 25 MCG (1000 UNIT) tablet Take 1,000 Units by mouth in the morning.   Yes [provider]  Insulin  Aspart, w/Niacinamide, (FIASP ) 100 UNIT/ML SOLN 77 Units continuous. Insulin  pump 09/27/22  Yes [provider]  insulin  glargine (LANTUS ) 100 UNIT/ML injection Inject 16 Units into the skin as needed (If pump goes out).   Yes [provider]  KRILL OIL PO Take 400 mg by mouth in the morning.   Yes [provider]  loratadine  (CLARITIN ) 10 MG tablet Take 10 mg by mouth in the morning.  Yes [provider]  methocarbamol  (ROBAXIN ) 500 MG tablet Take 1 tablet (500 mg total) by mouth every 12 (twelve) hours as needed for muscle spasms. Patient taking differently: Take 500 mg by mouth every 6 (six) hours as needed for muscle spasms. 09/28/23  Yes [name], MD  Multiple Vitamin (MULTIVITAMIN WITH MINERALS) TABS Take 1 tablet by mouth in the morning. Centrum   Yes [provider]  ondansetron  (ZOFRAN -ODT) 4 MG disintegrating tablet Take 1 tablet (4 mg total) by mouth every 8 (eight) hours as needed for nausea or vomiting. 09/09/23  Yes Ninetta Basket, MD  pantoprazole  (PROTONIX ) 40 MG tablet TAKE 1 TABLET BY MOUTH DAILY Patient taking differently: Take 40 mg by mouth in the morning. 06/27/23  Yes Mealor, Augustus E, MD  Potassium 99 MG TABS Take 99 mg by mouth in the morning.   Yes [provider]  pyridOXINE (VITAMIN B6) 50 MG tablet Take 50 mg by mouth in the morning.   Yes [provider]  ramipril  (ALTACE ) 5 MG capsule Take 1 capsule (5 mg total) by mouth daily. Patient taking  differently: Take 5 mg by mouth in the morning. 09/04/23  Yes [name], MD  traMADol  (ULTRAM ) 50 MG tablet Take 1 tablet (50 mg total) by mouth every 6 (six) hours as needed for moderate pain (pain score 4-6). 09/25/23  Yes [place], Eric J, DO  vitamin E  400 UNIT capsule Take 400 Units by mouth in the morning.   Yes [provider]  zinc gluconate 50 MG tablet Take 50 mg by mouth in the morning.   Yes [provider]  Abatacept  (ORENCIA  IV) Inject 750 mg into the vein every 28 (twenty-eight) days. Patient not taking: Reported on 10/08/2023    [provider]  albuterol  (VENTOLIN  HFA) 108 (90 Base) MCG/ACT inhaler Inhale 2 puffs into the lungs every 6 (six) hours as needed for wheezing or shortness of breath. Patient not taking: Reported on 10/08/2023 10/01/23   [name], MD  HYDROmorphone  (DILAUDID ) 2 MG tablet Take 0.5 tablets (1 mg total) by mouth every 6 (six) hours as needed for up to 3 days for severe pain (pain score 7-10). Patient not taking: Reported on 10/08/2023 10/06/23 10/09/23  Rolinda Climes, DO  ONE TOUCH ULTRA TEST test strip 1 each daily. Pt says 2-3 times daily 05/26/16   [provider]  pravastatin  (PRAVACHOL ) 40 MG tablet Take 40 mg by mouth in the morning. Patient not taking: Reported on 10/08/2023    [provider]     Family History  Problem Relation Age of Onset   Cancer Father        prostate    Social History   Socioeconomic History   Marital status: Married    Spouse name: Nadean August   Number of children: 3   Years of education: College   Highest education level: Not on file  Occupational History    Employer: UNEMPLOYED  Tobacco Use   Smoking status: Former    Current packs/day: 0.00    Average packs/day: 1 pack/day for 5.0 years (5.0 ttl pk-yrs)    Types: Cigarettes, Cigars    Start date: 05/16/1963    Quit date: 05/15/1968    Years since quitting: 55.4    Passive exposure: Never   Smokeless tobacco: Never    Tobacco comments:    Former smoker 06/20/23  Vaping Use   Vaping status: Never Used  Substance and Sexual Activity  Alcohol use: No    Alcohol/week: 0.0 standard drinks of alcohol   Drug use: Never   Sexual activity: Yes  Other Topics Concern   Not on file  Social History Narrative   Patient lives at home with family.   Caffeine Use: occasionally   Social Drivers of Corporate investment banker Strain: Not on file  Food Insecurity: No Food Insecurity (10/08/2023)   Hunger Vital Sign    Worried About Running Out of Food in the Last Year: Never true    Ran Out of Food in the Last Year: Never true  Transportation Needs: No Transportation Needs (10/08/2023)   PRAPARE - Administrator, Civil Service (Medical): No    Lack of Transportation (Non-Medical): No  Physical Activity: Not on file  Stress: Not on file  Social Connections: Moderately Isolated (10/08/2023)   Social Connection and Isolation Panel [NHANES]    Frequency of Communication with Friends and Family: More than three times a week    Frequency of Social Gatherings with Friends and Family: Once a week    Attends Religious Services: Never    Database administrator or Organizations: No    Attends Banker Meetings: Never    Marital Status: Married     Review of Systems: A 12 point ROS discussed and pertinent positives are indicated in the HPI above.  All other systems are negative.  Vital Signs: BP 112/67 (BP Location: Left Arm)   Pulse 75   Temp (!) 97.5 F (36.4 C)   Resp 17   Ht 5\' 6"  (1.676 m)   Wt 180 lb (81.6 kg)   SpO2 95%   BMI 29.05 kg/m    Physical Exam Constitutional:      Appearance: He is well-developed.  HENT:     Head: Normocephalic and atraumatic.     Mouth/Throat:     Mouth: Mucous membranes are moist.     Pharynx: Oropharynx is clear.  Pulmonary:     Effort: Pulmonary effort is normal.  Abdominal:     Comments: + RUQ drain, scant fluid in the gravity bag.   Skin:    General: Skin is warm and dry.     Coloration: Skin is not cyanotic or pale.  Neurological:     Mental Status: He is alert and oriented to person, place, and time.  Psychiatric:        Mood and Affect: Mood normal. Mood is not anxious.        Behavior: Behavior normal. Behavior is not agitated.        Imaging: CT CHEST ABDOMEN PELVIS W CONTRAST Result Date: 10/09/2023 CLINICAL DATA:  SOB, GB fossa drain in place EXAM: CT CHEST, ABDOMEN, AND PELVIS WITH CONTRAST TECHNIQUE: Multidetector CT imaging of the chest, abdomen and pelvis was performed following the standard protocol during bolus administration of intravenous contrast. RADIATION DOSE REDUCTION: This exam was performed according to the departmental dose-optimization program which includes automated exposure control, adjustment of the mA and/or kV according to patient size and/or use of iterative reconstruction technique. CONTRAST:  75mL OMNIPAQUE  IOHEXOL  350 MG/ML SOLN COMPARISON:  CT chest abdomen pelvis 10/05/2023, CT abdomen pelvis 09/21/2023 FINDINGS: CT CHEST FINDINGS Cardiovascular: Normal heart size. No significant pericardial effusion. The thoracic aorta is normal in caliber. Mild atherosclerotic plaque of the thoracic aorta. At least 3 vessel coronary artery calcifications. Mediastinum/Nodes: Stable mediastinal and right hilar lymphadenopathy with as an example a 1.2 cm precarinal lymph node (3:20) and  a 1.1 cm right hilar lymph node (3:20). No left hilar lymph node. Calcified right hilar lymph nodes suggestive of granulomatous disease. No axillary lymph nodes. Thyroid  gland, trachea, and esophagus demonstrate no significant findings. Lungs/Pleura: Punctate calcification in the right lower lobe suggestive of granulomatous disease. Left lower lobe atelectasis. No focal consolidation. No pulmonary nodule. No pulmonary mass. Interval increase in size of a loculated at least small volume right pleural effusion. Almost complete  collapse of the right lower lobe. No left pleural effusion. No pneumothorax. Musculoskeletal: No chest wall abnormality. No suspicious lytic or blastic osseous lesions. No acute displaced fracture. Multilevel degenerative changes of the spine. CT ABDOMEN PELVIS FINDINGS Hepatobiliary: No focal liver abnormality. Status post cholecystectomy. Grossly stable to slightly increased in size 3.7 x 2.2 x 4.5 cm gallbladder fossa collection of free fluid and foci of gas (6:103, 3:54). Trace associated gallbladder fossa/porta hepatics fat stranding. No biliary dilatation. Pancreas: No focal lesion. Normal pancreatic contour. No surrounding inflammatory changes. No main pancreatic ductal dilatation. Spleen: Normal in size without focal abnormality. Adrenals/Urinary Tract: No adrenal nodule bilaterally. Bilateral kidneys enhance symmetrically. Fluid density lesions within the kidneys likely represents a simple renal cysts. Simple renal cysts, in the absence of clinically indicated signs/symptoms, require no independent follow-up. Subcentimeter hypodensities are too small to characterize-no further follow-up indicated. No hydronephrosis. No hydroureter. No nephroureterolithiasis bilaterally. The urinary bladder is unremarkable. On delayed imaging, there is no urothelial wall thickening and there are no filling defects in the opacified portions of the bilateral collecting systems or ureters. Stomach/Bowel: Stomach is within normal limits. No evidence of bowel wall thickening or dilatation. Colonic diverticulosis. Appendix appears normal. Vascular/Lymphatic: The main portal, splenic, superior mesenteric veins are patent. No abdominal aorta or iliac aneurysm. Mild atherosclerotic plaque of the aorta and its branches. No abdominal, pelvic, or inguinal lymphadenopathy. Reproductive: Prostate is unremarkable. Other: No intraperitoneal free fluid. No intraperitoneal free gas. No organized fluid collection. Musculoskeletal: No abdominal  wall hernia or abnormality. No suspicious lytic or blastic osseous lesions. No acute displaced fracture. Multilevel degenerative changes of the spine. IMPRESSION: 1. Interval increase in size of a loculated at least small volume right pleural effusion. Almost complete collapse of the right lower lobe. 2. Likely reactive in etiology stable right hilar and mediastinal lymphadenopathy. Recommend attention on follow-up. 3. Grossly stable to slightly increased in size 3.7 x 2.2 x 4.5 cm gallbladder fossa collection of free fluid and foci of gas. 4. Colonic diverticulosis with no acute diverticulitis. 5. Sequelae of prior granulomatous disease. 6.  Aortic Atherosclerosis (ICD10-I70.0). Electronically Signed   By: Morgane  Naveau M.D.   On: 10/09/2023 20:30   DG Ankle 2 Views Left Result Date: 10/09/2023 CLINICAL DATA:  Left ankle pain. EXAM: LEFT ANKLE - 2 VIEW COMPARISON:  None Available. FINDINGS: There is no evidence of fracture, dislocation, or joint effusion. Ankle mortise is preserved. Mild tibial talar spurring. Moderate plantar calcaneal spur. No erosions or suspicious bone lesion. No focal soft tissue abnormality. IMPRESSION: 1. Mild degenerative tibiotalar spurring. 2. Moderate plantar calcaneal spur. Electronically Signed   By: Chadwick Colonel M.D.   On: 10/09/2023 15:43   DG Chest 2 View Result Date: 10/08/2023 EXAM: 2 VIEW(S) XRAY OF THE CHEST 10/08/2023 02:27:00 PM COMPARISON: 1 view chest x-ray 10/05/2023 CT of the chest 10/05/2023. CLINICAL HISTORY: Shortness of breath. SOB, hx of bradycardia, hiatal hernia. FINDINGS: LUNGS AND PLEURA: Progressive loculated right pleural effusion and right lower lobe airspace disease are present. Small left pleural effusion and  atelectasis are now present. The lung volumes are low. HEART AND MEDIASTINUM: No acute abnormality of the cardiac and mediastinal silhouettes. BONES AND SOFT TISSUES: No acute osseous abnormality. IMPRESSION: 1. Progressive loculated right  pleural effusion and right lower lobe airspace disease. 2. New small left pleural effusion and atelectasis. 3. Low lung volumes. Electronically signed by: Audree Leas MD 10/08/2023 02:38 PM EDT RP Workstation: [id number]   CT CHEST ABDOMEN PELVIS W CONTRAST Result Date: 10/05/2023 CLINICAL DATA:  Rule out leakage from chest tube placement. Back pain EXAM: CT CHEST, ABDOMEN, AND PELVIS WITH CONTRAST TECHNIQUE: Multidetector CT imaging of the chest, abdomen and pelvis was performed following the standard protocol during bolus administration of intravenous contrast. RADIATION DOSE REDUCTION: This exam was performed according to the departmental dose-optimization program which includes automated exposure control, adjustment of the mA and/or kV according to patient size and/or use of iterative reconstruction technique. CONTRAST:  75mL OMNIPAQUE  IOHEXOL  350 MG/ML SOLN COMPARISON:  CT chest 03/09/2017 FINDINGS: CT CHEST FINDINGS Cardiovascular: Normal heart size. No significant pericardial effusion. The thoracic aorta is normal in caliber. No atherosclerotic plaque of the thoracic aorta. No coronary artery calcifications. Mediastinum/Nodes: Chronic stable calcified and noncalcified mediastinal lymphadenopathy. Redemonstration of subdiaphragmatic/pericardiac lymph nodes measuring up to 1.1 cm (3: 45-46). No enlarged hilar or axillary lymph nodes. Thyroid  gland, trachea, and esophagus demonstrate no significant findings. Lungs/Pleura: Right lower lobe partial collapse. No focal consolidation. No pulmonary nodule. No pulmonary mass. Similar-appearing loculated right pleural effusion. No pneumothorax. Musculoskeletal: No abdominal wall hernia or abnormality. No suspicious lytic or blastic osseous lesions. No acute displaced fracture. Multilevel degenerative changes of the spine. CT ABDOMEN PELVIS FINDINGS Hepatobiliary: Cholecystostomy tube pigtail in the post cholecystectomy surgical bed with question developing 2  x 1.5 cm fluid collection with associated adjacent trace free fluid and gas. No focal liver abnormality. Status post cholecystectomy. No biliary dilatation. Pancreas: Diffusely atrophic. No focal lesion. Otherwise normal pancreatic contour. No surrounding inflammatory changes. No main pancreatic ductal dilatation. Spleen: Normal in size without focal abnormality. Adrenals/Urinary Tract: No adrenal nodule bilaterally. Bilateral kidneys enhance symmetrically. Fluid density lesions likely represent simple renal cysts. Simple renal cysts, in the absence of clinically indicated signs/symptoms, require no independent follow-up. No hydronephrosis. No hydroureter.  No nephroureterolithiasis. The urinary bladder is unremarkable. On delayed imaging, there is no urothelial wall thickening and there are no filling defects in the opacified portions of the bilateral collecting systems or ureters. Stomach/Bowel: Stomach is within normal limits. No evidence of bowel wall thickening or dilatation. Colonic diverticulosis. Appendix appears normal. Vascular/Lymphatic: No abdominal aorta or iliac aneurysm. Moderate atherosclerotic plaque of the aorta and its branches. No abdominal, pelvic, or inguinal lymphadenopathy. Reproductive: Prostate is unremarkable. Other: No intraperitoneal free fluid. No intraperitoneal free gas. No organized fluid collection. Musculoskeletal: No abdominal wall hernia or abnormality. No suspicious lytic or blastic osseous lesions. No acute displaced fracture. Multilevel degenerative changes of the spine. IMPRESSION: 1. Similar-appearing loculated right pleural effusion. No chest tube identified. 2. Cholecystostomy tube pigtail in the post cholecystectomy surgical bed with question developing 2 x 1.5 cm fluid collection with associated adjacent trace free fluid and gas. 3. Colonic diverticulosis with no acute diverticulitis. 4.  Aortic Atherosclerosis (ICD10-I70.0). Electronically Signed   By: Morgane  Naveau  M.D.   On: 10/05/2023 22:20   DG Chest Port 1 View Result Date: 10/05/2023 CLINICAL DATA:  78 year old male status post thoracentesis EXAM: PORTABLE CHEST 1 VIEW COMPARISON:  10/03/2023 FINDINGS: Cardiomediastinal silhouette unchanged in size and contour. Lungs  well aerated with coarsened interstitial markings. No significant airspace disease. No pneumothorax. No large pleural fluid. Drainage catheter within the right upper quadrant has been exchanged in the interval IMPRESSION: No complicating features after right-sided thoracentesis Electronically Signed   By: Myrlene Asper D.O.   On: 10/05/2023 16:34   CT THORACENTESIS Result Date: 10/05/2023 INDICATION: 78 year old male presents for thoracentesis EXAM: ULTRASOUND GUIDED RIGHT THORACENTESIS MEDICATIONS: None. COMPLICATIONS: None PROCEDURE: An ultrasound guided thoracentesis was thoroughly discussed with the patient and questions answered. The benefits, risks, alternatives and complications were also discussed. The patient understands and wishes to proceed with the procedure. Written consent was obtained. Ultrasound was performed to localize and mark an adequate pocket of fluid in the right chest. The area was then prepped and draped in the normal sterile fashion. 1% Lidocaine  was used for local anesthesia. Under ultrasound guidance a 8 Fr Safe-T-Centesis catheter was introduced. Thoracentesis was performed. The catheter was removed and a dressing applied. FINDINGS: A total of approximately 550 cc of serosanguineous fluid was removed. Samples were sent to the laboratory. IMPRESSION: Status post ultrasound-guided right-sided thoracentesis. Signed, Marciano Settles. Rexine Cater, RPVI [facility] Electronically Signed   By: Myrlene Asper D.O.   On: 10/05/2023 15:09   CT GUIDED PERITONEAL/RETROPERITONEAL FLUID DRAIN BY PERC CATH Result Date: 10/05/2023 INDICATION: 78 year old male status post  cholecystectomy with gallbladder fossa collection requiring percutaneous drainage 09/22/2023. The patient then developed a right-sided pleural effusion with scant drain output. Drain injection demonstrates communication with the pleural effusion, most likely bilious pleural fluid. Also there is evidence of a bile leak into accessory right-sided bile duct. Today he presents for attempted repositioning of the drain, to move the drain from a trans pleural location. EXAM: CT-GUIDED DRAIN PLACEMENT TECHNIQUE: Multidetector CT imaging of the abdomen was performed following the standard protocol without IV contrast. RADIATION DOSE REDUCTION: This exam was performed according to the departmental dose-optimization program which includes automated exposure control, adjustment of the mA and/or kV according to patient size and/or use of iterative reconstruction technique. MEDICATIONS: 2 g cefoxitin. The antibiotics were administered within an appropriate time frame prior to the initiation of the procedure. ANESTHESIA/SEDATION: Moderate (conscious) sedation was employed during this procedure. A total of Versed  2.5 mg and Fentanyl  75 mcg was administered intravenously by the radiology nurse. Total intra-service moderate Sedation Time: 66 minutes. The patient's level of consciousness and vital signs were monitored continuously by radiology nursing throughout the procedure under my direct supervision. COMPLICATIONS: None PROCEDURE: Informed written consent was obtained from the patient after a thorough discussion of the procedural risks, benefits and alternatives. All questions were addressed. Maximal Sterile Barrier Technique was utilized including caps, mask, sterile gowns, sterile gloves, sterile drape, hand hygiene and skin antiseptic. A timeout was performed prior to the initiation of the procedure. Patient was positioned supine on the CT gantry table. Scout CT acquired for planning purposes. The patient was then prepped and  draped in the usual sterile fashion. 1% lidocaine  was used for local anesthesia. Approximately 20 cc of saline infused through the indwelling drain in the gallbladder fossa. The intention was to create a pocket of fluid for targeting, however, the 20 cc of saline did not accumulate significantly in the gallbladder fossa, and likely instead traveled through the biliary system. Using CT guidance, a trocar needle was advanced from a more inferior, posterior position in a caudal-cranial trajectory targeting the drain in the gallbladder fossa. Once we confirmed needle tip position, modified  Seldinger technique was used to place a 10 [employer] pigtail catheter Once the catheter was in position, serial CT imaging was used to withdraw to an adequate position in the gallbladder fossa. The former drain was removed. Approximately 20 cc of saline was flushed through the new Brandy Cal drain which promptly exited the former skin site of the prior drain placement confirming adequate location. The former location was then sealed with Dermabond. The patient tolerated the procedure well and remained hemodynamically stable throughout. No complications were encountered and no significant blood loss. IMPRESSION: Status post CT-guided drain replacement into the gallbladder fossa, for bile leak. The former drain contributing to bilious pleural fluid collection was removed. Signed, Marciano Settles. Rexine Cater, RPVI [facility] Electronically Signed   By: Myrlene Asper D.O.   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 EXAM: ULTRASOUND GUIDED RIGHT THORACENTESIS MEDICATIONS: 1% lidocaine  10 mL COMPLICATIONS: None immediate. PROCEDURE: An  ultrasound guided thoracentesis was thoroughly discussed with the patient and questions answered. The benefits, risks, alternatives and complications were also discussed. The patient understands and wishes to proceed with the procedure. Written consent was obtained. Ultrasound was performed to localize and mark an adequate pocket of fluid in the right chest. The area was then prepped and draped in the normal sterile fashion. 1% Lidocaine  was used for local anesthesia. Under ultrasound guidance a 6 Fr Safe-T-Centesis catheter was introduced. Thoracentesis was performed. The catheter was removed and a dressing applied. FINDINGS: A total of approximately 1.1 L of dark red, cloudy fluid was removed. Samples were sent to the laboratory as requested by the clinical team. IMPRESSION: Successful ultrasound guided right thoracentesis yielding dark red, cloudy of pleural fluid. Performed By Lorinda Root, PA-C Electronically Signed   By: Creasie Doctor M.D.   On: 10/03/2023 15:11   DG Chest 1 View Result Date: 10/03/2023 CLINICAL DATA:  78 year old male status post thoracentesis EXAM: CHEST  1 VIEW COMPARISON:  CT abdomen 10/03/2023 FINDINGS: Cardiomediastinal silhouette within normal limits. Trace right-sided pleural effusion. No pneumothorax. Lungs are well aerated. Abdominal drainage catheter in the right upper quadrant. No displaced fracture IMPRESSION: No complicating features after right-sided thoracentesis Electronically Signed   By: Myrlene Asper D.O.   On: 10/03/2023 14:57   CT ABDOMEN PELVIS W CONTRAST Result Date: 10/03/2023 CLINICAL DATA:  Drain after cholecystectomy. EXAM: CT ABDOMEN AND PELVIS WITH CONTRAST TECHNIQUE: Multidetector CT imaging of the abdomen and pelvis was performed using the standard protocol following bolus administration of intravenous contrast. RADIATION DOSE REDUCTION: This exam was performed according to the departmental dose-optimization program which includes automated exposure  control, adjustment of the mA and/or kV according to patient size and/or use of iterative reconstruction technique. CONTRAST:  100mL ISOVUE-300 IOPAMIDOL (ISOVUE-300) INJECTION 61% COMPARISON:  CT abdomen and pelvis 09/21/2023. FINDINGS: Lower chest: Large right pleural effusion with partial right lower lobar atelectasis. No cardiomegaly or pericardial effusion. Hepatobiliary: Normal hepatic size and contour. No focal liver lesion. Interval percutaneous drainage catheter in the gallbladder fossa with resolution of the previously described collection. No biliary ductal dilation. Pancreas: Unremarkable. No pancreatic ductal dilatation or surrounding inflammatory changes. Spleen: Normal in size without focal abnormality. Adrenals/Urinary Tract: Adrenal glands are unremarkable. Kidneys are normal, without renal calculi, focal lesion, or hydronephrosis.  Simple bilateral renal cortical cysts. Bladder is unremarkable. Stomach/Bowel: Mild residual wall thickening of the gastric antrum and proximal duodenum, improved from prior and again likely reactive. No obstruction. Colonic diverticulosis. No pericolonic inflammation. Normal appendix. Vascular/Lymphatic: Aortic atherosclerosis. No enlarged abdominal or pelvic lymph nodes. Reproductive: Prostate is unremarkable. Other: No abdominal wall hernia or abnormality. No abdominopelvic ascites. Musculoskeletal: No acute or significant osseous findings. Multilevel degenerative changes. Bilateral L5 pars interarticularis defects. IMPRESSION: 1. Interval percutaneous drainage catheter in the gallbladder fossa with resolution of the previously described collection. 2. New, large right pleural effusion with right lower lobar atelectasis. Electronically Signed   By: Rox Cope M.D.   On: 10/03/2023 12:36   IR INJECT INDWELLING DRAINAGE CATHETER Result Date: 10/02/2023 INDICATION: Gallbladder fossa abscess following cholecystectomy status post percutaneous drain 09/22/2023  leakage at the drain skin site. EXAM: FLUOROSCOPIC INJECTION OF THE GALLBLADDER FOSSA ABSCESS DRAIN MEDICATIONS: None. ANESTHESIA/SEDATION: None. COMPLICATIONS: None immediate. PROCEDURE: Informed written consent was obtained from the patient after a thorough discussion of the procedural risks, benefits and alternatives. All questions were addressed. Maximal Sterile Barrier Technique was utilized including caps, mask, sterile gowns, sterile gloves, sterile drape, hand hygiene and skin antiseptic. A timeout was performed prior to the initiation of the procedure. Under sterile conditions, the existing right upper quadrant gallbladder fossa abscess drain was injected with contrast. Fluoroscopic imaging performed. There is fistula communication from the collapsed abscess cavity to an adjacent right hepatic duct. Contrast opacifies the biliary tree, biliary confluence, common hepatic duct and common bile duct. Biliary tree is patent with contrast opacifying the duodenum. There is reflux of contrast into the residual cystic duct. Contrast also leaks along the catheter tract into the perihepatic space suggesting an underlying perihepatic fluid collection/biloma. Included fluoroscopic imaging of the right chest demonstrates a moderate size right pleural effusion as well. IMPRESSION: Collapse gallbladder fossa abscess cavity has a fistula communication to a small right hepatic duct. Opacified biliary tree is patent with contrast draining into the duodenum. Perihepatic leakage of contrast during the injection suggest perihepatic fluid collection/biloma. Recommend further assessment with abdomen pelvis CT with contrast. Moderate-sized right pleural effusion. This Fason warrant right thoracentesis. Electronically Signed   By: Melven Stable.  Shick M.D.   On: 10/02/2023 16:37   DG Chest 1 View Result Date: 09/24/2023 CLINICAL DATA:  Pain. EXAM: CHEST  1 VIEW COMPARISON:  05/28/2023. FINDINGS: Low lung volumes. The heart size and  mediastinal contours are within normal limits. Aortic atherosclerosis. Mild bibasilar linear and subsegmental atelectasis. No sizable pleural effusion or pneumothorax. No acute osseous abnormality. Percutaneous drainage catheter overlies the right upper quadrant. IMPRESSION: Low lung volumes with mild bibasilar linear and subsegmental atelectasis. Electronically Signed   By: Mannie Seek M.D.   On: 09/24/2023 16:23   DG Shoulder Right Result Date: 09/24/2023 CLINICAL DATA:  Right shoulder pain. EXAM: RIGHT SHOULDER - 2+ VIEW COMPARISON:  None Available. FINDINGS: There is no evidence of acute fracture or dislocation. Mild degenerative changes of the glenohumeral joint. Mild degenerative changes of the acromioclavicular joint with intra-articular body. Soft tissues are grossly unremarkable. IMPRESSION: 1. No acute osseous abnormality. 2. Mild degenerative changes of the right glenohumeral and acromioclavicular joints. Electronically Signed   By: Mannie Seek M.D.   On: 09/24/2023 16:18   DG Cervical Spine 2 or 3 views Result Date: 09/24/2023 CLINICAL DATA:  Neck pain. EXAM: CERVICAL SPINE - 2-3 VIEW COMPARISON:  CT cervical spine dated 08/20/2021. FINDINGS: There is no evidence of acute cervical spine fracture  or prevertebral soft tissue swelling. Multilevel degenerative disc height loss and endplate osteophytosis most pronounced at C4-C7 with similar mild anterolisthesis of C7 on T1. IMPRESSION: 1. No acute osseous abnormality. 2. Multilevel degenerative changes of the cervical spine, as above. Electronically Signed   By: Mannie Seek M.D.   On: 09/24/2023 16:16   CT GUIDED PERITONEAL/RETROPERITONEAL FLUID DRAIN BY PERC CATH Result Date: 09/22/2023 CLINICAL DATA:  Gallbladder fossa abscess. Status post cholecystectomy on 09/05/2023. EXAM: CT GUIDED CATHETER DRAINAGE OF PERITONEAL ABSCESS ANESTHESIA/SEDATION: Moderate (conscious) sedation was employed during this procedure. A total of Versed  4.0  mg and Fentanyl  200 mcg was administered intravenously. Moderate Sedation Time: 30 minutes. The patient's level of consciousness and vital signs were monitored continuously by radiology nursing throughout the procedure under my direct supervision. PROCEDURE: The procedure, risks, benefits, and alternatives were explained to the patient. Questions regarding the procedure were encouraged and answered. The patient understands and consents to the procedure. A time out was performed prior to initiating the procedure. The abdominal wall was prepped with chlorhexidine  in a sterile fashion, and a sterile drape was applied covering the operative field. A sterile gown and sterile gloves were used for the procedure. Local anesthesia was provided with 1% Lidocaine . CT was performed in a supine position. From a right anterolateral approach, an 18 gauge trocar needle was advanced into a gallbladder fossa abscess. After return of fluid, a guidewire was advanced into the collection, the tract dilated and a 10 French percutaneous drain advanced over the wire. The drain was formed and additional 20 mL fluid sample withdrawn for culture analysis. The drain was flushed with sterile saline and attached to a suction bulb. It was secured at the skin with a Prolene retention suture and adhesive StatLock device. RADIATION DOSE REDUCTION: This exam was performed according to the departmental dose-optimization program which includes automated exposure control, adjustment of the mA and/or kV according to patient size and/or use of iterative reconstruction technique. COMPLICATIONS: None FINDINGS: The gallbladder fossa abscess containing fluid and air yielded grossly purulent fluid. After drain placement there is good return of purulent fluid. A fluid sample was sent for culture analysis. There was a limited window for approach to the second probable complex fluid collection abutting the proximal duodenum. After drain placement and partial  decompression of the gallbladder fossa abscess, it appeared that the adjacent collection Markovitz be slightly smaller and therefore it was chosen to not perform a second aspiration/drainage procedure at this time and watch this collection. IMPRESSION: CT-guided percutaneous catheter drainage of gallbladder fossa abscess yielding purulent fluid. A 10 French drain was placed and attached to suction bulb drainage. A sample of purulent fluid was sent for culture analysis. A second adjacent fluid collection near the proximal duodenum appeared potentially slightly smaller after partial decompression of the gallbladder fossa abscess so a second aspiration drainage procedure was not performed at this time. Electronically Signed   By: Erica Hau M.D.   On: 09/22/2023 12:59   DG Abd Portable 1 View Result Date: 09/21/2023 CLINICAL DATA:  NG tube placement. EXAM: PORTABLE ABDOMEN - 1 VIEW COMPARISON:  None Available. FINDINGS: A nasogastric tube is seen with its distal tip overlying the expected region of the body of the stomach. Its distal side hole sits approximately 6.5 cm distal to the expected region of the gastroesophageal junction. The bowel gas pattern is normal. No radio-opaque calculi or other significant radiographic abnormality are seen. IMPRESSION: Nasogastric tube positioning, as described above. Electronically Signed  By: Virgle Grime M.D.   On: 09/21/2023 18:47   NM HEPATOBILIARY LEAK (POST-SURGICAL) Result Date: 09/21/2023 CLINICAL DATA:  Acute abdominal pain, rule out bile leak. Cholecystectomy 09/05/2023 EXAM: NUCLEAR MEDICINE HEPATOBILIARY IMAGING TECHNIQUE: Sequential images of the abdomen were obtained out to 60 minutes following intravenous administration of radiopharmaceutical. RADIOPHARMACEUTICALS:  Five mCi Tc-3m  Choletec  IV COMPARISON:  CT abdomen 09/21/2023 FINDINGS: Satisfactory uptake of radiopharmaceutical from the blood pool. Biliary activity at 9 minutes and bowel activity at 11  minutes. We carried out imaging for 2 full hours. No abnormal extension of radiopharmaceutical to suggest bile leak. IMPRESSION: 1. No bile leak is demonstrated. We carried out observation for 2 full hours. 2. Patent common bile duct. Electronically Signed   By: Freida Jes M.D.   On: 09/21/2023 16:41   CT ABDOMEN PELVIS W CONTRAST Result Date: 09/21/2023 CLINICAL DATA:  Acute generalized abdominal pain. EXAM: CT ABDOMEN AND PELVIS WITH CONTRAST TECHNIQUE: Multidetector CT imaging of the abdomen and pelvis was performed using the standard protocol following bolus administration of intravenous contrast. RADIATION DOSE REDUCTION: This exam was performed according to the departmental dose-optimization program which includes automated exposure control, adjustment of the mA and/or kV according to patient size and/or use of iterative reconstruction technique. CONTRAST:  75mL OMNIPAQUE  IOHEXOL  350 MG/ML SOLN COMPARISON:  September 09, 2023. FINDINGS: Lower chest: No acute abnormality. Hepatobiliary: Status post cholecystectomy. 7.3 x 5.9 cm fluid collection is noted in the gallbladder fossa which is enlarged compared to prior exam which contains air-fluid level and is highly concerning for abscess or possibly biloma. Surrounding inflammatory changes are noted. Pancreas: Unremarkable. No pancreatic ductal dilatation or surrounding inflammatory changes. Spleen: Normal in size without focal abnormality. Adrenals/Urinary Tract: Adrenal glands appear normal. Bilateral renal cysts are noted. No hydronephrosis or renal obstruction is noted. Urinary bladder is unremarkable. Stomach/Bowel: The appendix is unremarkable. Sigmoid diverticulosis is noted without inflammation. Mild to moderate gastric distention is noted concerning for gastric outlet obstruction. This appears to be due to significant wall thickening and inflammatory changes involving the gastric antrum and proximal duodenum, most secondary to adjacent inflammation  and abscess in gallbladder fossa. 6.3 x 3.9 cm fluid-filled structure is seen in this area which [place] represent abscess as well, or potentially be inflamed proximal duodenum. Vascular/Lymphatic: Aortic atherosclerosis. No enlarged abdominal or pelvic lymph nodes. Reproductive: Prostate is unremarkable. Other: No hernia or ascites is noted. Musculoskeletal: No acute or significant osseous findings. IMPRESSION: Status post cholecystectomy. 7.3 x 5.9 cm fluid collection with air-fluid levels is noted in gallbladder fossa which is enlarged compared to prior exam of September 09, 2023 and is most consistent with abscess or possibly biloma. There is also noted 6.3 x 3.9 cm fluid-filled structure medial to this; it is uncertain if this abnormality represents abscess, or severely inflamed gastric antrum and proximal duodenum. Mild to moderate gastric distention is noted suggesting gastric outlet obstruction. The second fluid collection does appear to extend to the subcapsular region inferiorly of right hepatic lobe. Sigmoid diverticulosis without inflammation. Aortic Atherosclerosis (ICD10-I70.0). Electronically Signed   By: Rosalene Colon M.D.   On: 09/21/2023 11:24    Labs:  CBC: Recent Labs    10/05/23 0905 10/05/23 1933 10/08/23 1306 10/09/23 0422  WBC 9.4 10.7* 12.9* 11.0*  HGB 12.4* 12.3* 11.4* 10.5*  HCT 35.7* 36.9* 32.9* 30.9*  PLT 473* 450* 405* 389    COAGS: Recent Labs    05/28/23 1737 06/01/23 1343 08/13/23 0852 08/17/23 1413 09/22/23 0359 10/05/23  0905  INR 2.8*   < > 2.3 1.1 1.2 1.2  APTT 50*  --   --   --   --   --    < > = values in this interval not displayed.    BMP: Recent Labs    09/25/23 0555 10/05/23 1933 10/08/23 1306 10/09/23 0422  NA 136 127* 131* 130*  K 3.1* 5.1 3.7 3.6  CL 101 92* 95* 94*  CO2 29 22 25 25   GLUCOSE 93 512* 113* 107*  BUN 12 16 8 10   CALCIUM 8.2* 8.3* 8.3* 8.2*  CREATININE 1.05 1.22 0.97 0.93  GFRNONAA >60 >60 >60 >60    LIVER FUNCTION  TESTS: Recent Labs    09/09/23 0907 09/21/23 0734 10/05/23 1933 10/09/23 0422  BILITOT 1.4* 1.0 0.8 0.5  AST 51* 34 44* 22  ALT 51* 20 32 19  ALKPHOS 157* 99 94 93  PROT 7.3 6.7 6.2* 5.5*  ALBUMIN 4.2 2.9* 2.5* 1.9*    TUMOR MARKERS: No results for input(s): "AFPTM", "CEA", "CA199", "CHROMGRNA" in the last 8760 hours.  Assessment and Plan: 78 y.o. male with recurrent SOB after cholecystectomy and GB fossa drain placement with possible bile leak into the right pleural space.   VSS CBC WBD 11.0 trended down from yesterday 12/9 LFT wnl OP from  thr GB fossa drain: none  Not on AC/AP Allergies reviewed   PLAN - will review CT CAP with tomorrow AM - NPO at MN for possible right chest tube placement, will need to discuss with PCCM first - Drain injection ordered  Thank you for this interesting consult.  I greatly enjoyed meeting Cristhian Vanhook Sievers Sr. and look forward to participating in their care.  A copy of this report was sent to the requesting provider on this date.  Electronically Signed: Darel Ebbs, PA-C 10/09/2023, 8:54 PM   I spent a total of 40 Minutes   in face to face in clinical consultation, greater than 50% of which was counseling/coordinating care for SOB, s/p GB fossa drain placement after cholecystectomy.   This chart was dictated using voice recognition software.  Despite best efforts to proofread,  errors can occur which can change the documentation meaning.

## 2023-10-09 NOTE — Progress Notes (Signed)
   10/09/23 1610  Spiritual Encounters  Type of Visit Initial  Care provided to: Pt and family  Referral source Other (comment) (Spiritual consult)  Reason for visit Advance directives  OnCall Visit No   Chaplain met with the patient, Joel Jones and his family members pertaining to completing an advanced directive. I answered questions and provided next steps when the documents are complete. The patient wanted one for himself and one for his wife.   [name] [facility]  [phone]

## 2023-10-09 NOTE — Progress Notes (Signed)
 General surgery was asked to weigh in on this patient who we briefly saw earlier this month after having a robotic cholecystectomy by Dr. [name] who presented with multiple gb fossa/adjacent abscesses.  At that time, he had a HIDA that was negative for a bile leak.  Since his drain was placed, he has followed up with IR and underwent drain replacement as well as injection.  His injection on 10/02/23 by Dr. Lovell Rubenstein raised concern for a fistula from his abscess cavity to a small right hepatic duct concerning for a bile leak.  Since that time, his drain has remained in place and is now with minimal output.  A recent CT scan revealed significant improvement in these fluid collections.  Unfortunately, he has developed recurrent right-sided pleural effusions.  His thora on 5/21 was bloody in nature.  He went back on 5/23 for a repeat which was serosanguineous.  This has never appeared bilious according to IR's notes.  He went back again today for a repeat thora, but this fluid was loculated and unable to be drained.  He is currently undergoing a repeat CT scan of the CAP to evaluate all of these issues.  I have personally spoken with Dr. Julietta Ogren to better understand the situation and recent IR findings, Dr. Efrain Grant, as well as Dr. [name] via secure chat.  I agree with Dr. Alwyn Juba plan.  If his CT scan shows resolution of the collection in his abdomen and his drain does not show a fistulous tract, then his drain can be removed with no further intervention warranted in the abdomen.  I agree with pulm/TCTS evaluation of the pleural effusion.  This is almost certainly sympathetic from the RUQ process that has been going on for the last month, but unfortunately now loculated.  Will defer to other services regarding a plan for this.  As of right now, there are no acute general surgery needs.  Will defer to IR for further drain management at this time.  I did stop by and speak to the family to let them know that I would discuss the  current situation with all of the above doctors to see if there was anything to offer surgically.  Joel Jones 5:20 PM 10/09/2023

## 2023-10-09 NOTE — Progress Notes (Signed)
 Patient presented to radiology for thoracentesis today.   Bilateral pleural space visualized with ultrasound.  No fluid collection for safe approach found.  Ultrasound images saved for documentation purpose.   Thoracentesis was NOT performed.    Jackelyne Sayer H Tameyah Koch PA-C 10/09/2023 3:24 PM

## 2023-10-09 NOTE — Progress Notes (Addendum)
 PROGRESS NOTE  Joel Xue Elsbernd Sr. QMV:[id number] DOB: Jan 03, 1946 DOA: 10/08/2023 PCP: [name], MD   LOS: 0 days   Brief narrative:  Joel Patches Mendonsa Sr. is a 78 y.o. male with past medical history significant of hypertension, hyperlipidemia, diabetes, GERD, CAD, atrial fibrillation, bradycardia, obesity, RA, asthma to hospital with recurrent shortness of breath and right-sided chest pain.  Of note patient had initially presented to [facility] and underwent cholecystectomy on 08/29/2023 and had a follow-up CT scan which showed fluid collection.  Patient presented on 5/9 with continued abdominal pain and was noted to have gastric outlet obstruction with concern for abscess.  He was then admitted and was seen by general surgery and interventional radiology and had IR placed drain which showed Enterococcus and Enterobacter species and had received IV Zosyn .  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  Drain was also reportedly repositioned on 523.  Patient has had 2 thoracocentesis for this symptoms.  At this time again presented with shortness of breath worse on exertion with right-sided chest pain.  In the ED, patient was notable for hyponatremia with sodium of 131.  Leukocytosis at 12.9.  Case was discussed with interventional radiology and was admitted to hospital for further evaluation and treatment.      Assessment/Plan: Principal Problem:   Recurrent pleural effusion on right Active Problems:   CAD (coronary artery disease)   Essential hypertension   Rheumatoid arthritis involving multiple sites with positive rheumatoid factor (HCC)   Gastroesophageal reflux disease   Type 2 diabetes mellitus with other specified complication (HCC)   Obesity   Asthmatic bronchitis , chronic (HCC)   Atrial fibrillation (HCC)   Hyperlipidemia associated with type 2 diabetes mellitus (HCC)   Recurrent right  pleural effusion Status post cholecystectomy History of intra-abdominal abscess status post drain placement History of recurrent right-sided pleural effusion secondary to biliary leak diaphragmatic fistula in the setting of drain placement for abdominal abscess following cholecystectomy.  IR has been consulted at this time.  Mild leukocytosis.  Culture from 5/21 with no growth.  Follow bilirubin added to 5/23 labs.  Eliquis  on hold for potential need for procedure.  Continue pain management and supportive care.  Currently on room air.  Seen by general surgery as outpatient.  Hypertension - Continue home amlodipine  and ramipril  blood pressure seems to be controlled at this time.   Hyperlipidemia - Continue atorvastatin   Diabetes - Continue insulin  pump   GERD - Continue PPI   CAD - Continue home ramipril , Eliquis    Atrial fibrillation - Holding Eliquis  for now   Obesity - Noted   Rheumatoid arthritis - On abatacept  outpatient.   Asthma - Continue home as needed albuterol .  Currently compensated.  Left ankle pain   Has been on colchicine trial by previous provider.  Will discontinue for now..  Will get x-ray of the left foot.  No obvious redness but could be arthritis with joint effusion.  Uric acid low at 2.8.  DVT prophylaxis: Place and maintain sequential compression device Start: 10/08/23 1819   Disposition: Likely home with home health in 2 to 3 days.  Status is: Observation The patient will require care spanning > 2 midnights and should be moved to inpatient because: Recurrent pleural effusion under workup, need for IR guided intervention, pending clinical improvement    Code Status:     Code Status: Full Code  Family Communication: Spoke with  the patient's son and daughter-in-law at bedside  Consultants: Interventional radiology  Procedures: None yet  Anti-infectives:  None yet  Anti-infectives (From admission, onward)    None        Subjective: Today, patient was seen and examined at bedside.  Complains of left ankle pain and swelling.  Family at bedside.  Feels frustrated about the whole situation.  Objective: Vitals:   10/09/23 0412 10/09/23 0736  BP: 118/64 112/67  Pulse: 66 75  Resp: 18 17  Temp: 98.6 F (37 C) (!) 97.5 F (36.4 C)  SpO2: 96% 95%    Intake/Output Summary (Last 24 hours) at 10/09/2023 0923 Last data filed at 10/08/2023 2200 Gross per 24 hour  Intake 100 ml  Output --  Net 100 ml   Filed Weights   10/08/23 1249  Weight: 81.6 kg   Body mass index is 29.05 kg/m.   Physical Exam:  GENERAL: Patient is alert awake and oriented. Not in obvious distress.  Elderly male, weak and deconditioned. HENT: No scleral pallor or icterus. Pupils equally reactive to light. Oral mucosa is moist NECK: is supple, no gross swelling noted. CHEST: Clear to auscultation. No crackles or wheezes.   CVS: S1 and S2 heard, no murmur. Regular rate and rhythm.  ABDOMEN: Soft, non-tender, bowel sounds are present.  Abdominal drain in place with dressing. EXTREMITIES: No edema.  Left ankle with mild swelling but without erythema.  Tenderness on palpation of the joint line. CNS: Cranial nerves are intact. No focal motor deficits. SKIN: warm and dry without rashes.  Data Review: I have personally reviewed the following laboratory data and studies,  CBC: Recent Labs  Lab 10/05/23 0905 10/05/23 1933 10/08/23 1306 10/09/23 0422  WBC 9.4 10.7* 12.9* 11.0*  NEUTROABS  --  8.4*  --   --   HGB 12.4* 12.3* 11.4* 10.5*  HCT 35.7* 36.9* 32.9* 30.9*  MCV 86.7 87.6 86.1 85.1  PLT 473* 450* 405* 389   Basic Metabolic Panel: Recent Labs  Lab 10/05/23 1933 10/08/23 1306 10/09/23 0422  NA 127* 131* 130*  K 5.1 3.7 3.6  CL 92* 95* 94*  CO2 22 25 25   GLUCOSE 512* 113* 107*  BUN 16 8 10   CREATININE 1.22 0.97 0.93  CALCIUM 8.3* 8.3* 8.2*   Liver Function Tests: Recent Labs  Lab 10/05/23 1933  10/09/23 0422  AST 44* 22  ALT 32 19  ALKPHOS 94 93  BILITOT 0.8 0.5  PROT 6.2* 5.5*  ALBUMIN 2.5* 1.9*   Recent Labs  Lab 10/05/23 1933  LIPASE 25   No results for input(s): "AMMONIA" in the last 168 hours. Cardiac Enzymes: No results for input(s): "CKTOTAL", "CKMB", "CKMBINDEX", "TROPONINI" in the last 168 hours. BNP (last 3 results) No results for input(s): "BNP" in the last 8760 hours.  ProBNP (last 3 results) No results for input(s): "PROBNP" in the last 8760 hours.  CBG: Recent Labs  Lab 10/05/23 2123 10/05/23 2242 10/08/23 1809 10/08/23 1948 10/09/23 0234  GLUCAP 489* 411* 82 201* 125*   Recent Results (from the past 240 hours)  Body fluid culture w Gram Stain     Status: None   Collection Time: 10/03/23  2:23 PM   Specimen: Lung, Right; Pleural Fluid  Result Value Ref Range Status   Specimen Description PLEURAL  Final   Special Requests RIGHT LUNG  Final   Gram Stain   Final    FEW WBC PRESENT,BOTH PMN AND MONONUCLEAR NO ORGANISMS SEEN    Culture  Final    NO GROWTH 3 DAYS Performed at [facility], [address] [place], [place] [address]    Report Status 10/06/2023 FINAL  Final     Studies: DG Chest 2 View Result Date: 10/08/2023 EXAM: 2 VIEW(S) XRAY OF THE CHEST 10/08/2023 02:27:00 PM COMPARISON: 1 view chest x-ray 10/05/2023 CT of the chest 10/05/2023. CLINICAL HISTORY: Shortness of breath. SOB, hx of bradycardia, hiatal hernia. FINDINGS: LUNGS AND PLEURA: Progressive loculated right pleural effusion and right lower lobe airspace disease are present. Small left pleural effusion and atelectasis are now present. The lung volumes are low. HEART AND MEDIASTINUM: No acute abnormality of the cardiac and mediastinal silhouettes. BONES AND SOFT TISSUES: No acute osseous abnormality. IMPRESSION: 1. Progressive loculated right pleural effusion and right lower lobe airspace disease. 2. New small left pleural effusion and atelectasis. 3. Low lung volumes.  Electronically signed by: Audree Leas MD 10/08/2023 02:38 PM EDT RP Workstation: [id number]      Joel Conradi, MD  [facility] 10/09/2023  If 7PM-7AM, please contact night-coverage

## 2023-10-09 NOTE — Progress Notes (Addendum)
 Transition of Care [facility]) - Inpatient Brief Assessment   Patient Details  Name: Joel Kollmann Hampshire Sr. MRN: [id number] Date of Birth: [birth year]  [facility] CM/SW Contact:    Dane Dung, RN Phone Number: 10/09/2023, 2:54 PM   Clinical Narrative: CM met with the patient at the bedside to discuss TOC needs.  The patient returned to the hospital with SOB and states that his biliary drain is not working.  Patient's son and daughter in law are at the bedside at this time and concerned when patient will see IR MD.  I sent a message to attending physician and made them aware of family/patient's concerns.  DME at the home includes RW, potty chair.  Patient is normally independent and states that he will likely not need home health services.  CT abdomen, labs and pending thoracentesis at this time.  No other needs at this time.  Patient plans to return home with family when medically stable for discharge in the next few days.   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

## 2023-10-09 NOTE — Progress Notes (Signed)
 Orthopedic Tech Progress Note Patient Details:  Joel Michalowski Cogar Sr. 09/25/1945 [id number]  Ortho Devices Type of Ortho Device: ASO Ortho Device/Splint Location: LLE Ortho Device/Splint Interventions: Ordered   Post Interventions Patient Tolerated: Well Instructions Provided: Care of device  Kermitt Pedlar 10/09/2023, 2:04 PM

## 2023-10-09 NOTE — Inpatient Diabetes Management (Signed)
 Inpatient Diabetes Program Recommendations  AACE/ADA: New Consensus Statement on Inpatient Glycemic Control (2015)  Target Ranges:  Prepandial:   less than 140 mg/dL      Peak postprandial:   less than 180 mg/dL (1-2 hours)      Critically ill patients:  140 - 180 mg/dL   Lab Results  Component Value Date   GLUCAP 125 (H) 10/09/2023   HGBA1C 8.3 05/07/2023    Review of Glycemic Control  Latest Reference Range & Units 10/08/23 18:09 10/08/23 19:48 10/09/23 02:34  Glucose-Capillary 70 - 99 mg/dL 82 147 (H) 829 (H)   Diabetes history: DM2 Outpatient Diabetes medications:  [employer] Insulin  Pump with Fiasp  Basal-1.20/hr--28.8 units/day Carb ratio 00:00-07:00-15 07:00-11:30-10 11:30-00:00-15 Target BG 90-110 Insulin  sensitivity factor--1 units brings him down 50 mg/dL Inpatient Diabetes Program Recommendations:    Spoke with patient's family member. He is currently wearing insulin  pump and has all supplies.  Will follow.    Thanks,  Josefa Ni, RN, BC-ADM Inpatient Diabetes Coordinator Pager [phone]  (8a-5p)

## 2023-10-09 NOTE — Progress Notes (Addendum)
 Patient reported left-sided ankle pain and swelling concern for gout attack. Checking acid level. -Given patient has history rheumatoid arthritis in the setting of acute illness and infection high propensity of development of gout flare. -With the concern for gout flare giving colchicine  1.2 mg followed by give 0.6 mg 1 hour later.  Afterward continue 0.6 mg once daily for 7 days.  Joel Weider, MD [facility] 10/09/2023, 6:29 AM

## 2023-10-09 NOTE — Progress Notes (Signed)
 Patient ID: Joel Hamm Shultis Sr., male   DOB: 02/25/1946, 78 y.o.   MRN: [id number] Joel Jones is a well-known patient of ours from a orthopedic standpoint.  We see him on a regular basis in the clinic.  He is hospitalized now at [facility] and we were asked to take a look at his left ankle.  He recently injured his ankle with a twisting type of injury and low-energy trauma.  He complains of medial ankle pain on the left ankle.  On examination the left ankle there is some pain over the deltoid ligament.  His range of motion is full and there is no instability on ligamentous exam.  His foot is well-perfused.  There is no lateral pain.  2 views of the left ankle show an intact ankle mortise and a congruent ankle joint with no obvious fracture.  He can weight-bear as tolerated on his left ankle.  I do feel that an ASO would be appropriate to order for his left ankle for comfort and stability purposes when he ambulates and mobilizes.  We will order this from the Ortho techs.

## 2023-10-10 ENCOUNTER — Other Ambulatory Visit

## 2023-10-10 ENCOUNTER — Inpatient Hospital Stay (HOSPITAL_COMMUNITY)

## 2023-10-10 DIAGNOSIS — J90 Pleural effusion, not elsewhere classified: Secondary | ICD-10-CM | POA: Diagnosis not present

## 2023-10-10 HISTORY — PX: IR CV LINE INJECTION: IMG2294

## 2023-10-10 LAB — CBC
HCT: 29.4 % — ABNORMAL LOW (ref 39.0–52.0)
Hemoglobin: 9.9 g/dL — ABNORMAL LOW (ref 13.0–17.0)
MCH: 29.3 pg (ref 26.0–34.0)
MCHC: 33.7 g/dL (ref 30.0–36.0)
MCV: 87 fL (ref 80.0–100.0)
Platelets: 408 10*3/uL — ABNORMAL HIGH (ref 150–400)
RBC: 3.38 MIL/uL — ABNORMAL LOW (ref 4.22–5.81)
RDW: 12.9 % (ref 11.5–15.5)
WBC: 8.6 10*3/uL (ref 4.0–10.5)
nRBC: 0 % (ref 0.0–0.2)

## 2023-10-10 LAB — COMPREHENSIVE METABOLIC PANEL WITH GFR
ALT: 17 U/L (ref 0–44)
AST: 18 U/L (ref 15–41)
Albumin: 1.8 g/dL — ABNORMAL LOW (ref 3.5–5.0)
Alkaline Phosphatase: 79 U/L (ref 38–126)
Anion gap: 9 (ref 5–15)
BUN: 9 mg/dL (ref 8–23)
CO2: 25 mmol/L (ref 22–32)
Calcium: 7.7 mg/dL — ABNORMAL LOW (ref 8.9–10.3)
Chloride: 95 mmol/L — ABNORMAL LOW (ref 98–111)
Creatinine, Ser: 0.84 mg/dL (ref 0.61–1.24)
GFR, Estimated: >60 mL/min (ref >60)
Glucose, Bld: 86 mg/dL (ref 70–99)
Potassium: 3.1 mmol/L — ABNORMAL LOW (ref 3.5–5.1)
Sodium: 129 mmol/L — ABNORMAL LOW (ref 135–145)
Total Bilirubin: 0.6 mg/dL (ref 0.0–1.2)
Total Protein: 5.2 g/dL — ABNORMAL LOW (ref 6.5–8.1)

## 2023-10-10 LAB — GLUCOSE, CAPILLARY
Glucose-Capillary: 56 mg/dL — ABNORMAL LOW (ref 70–99)
Glucose-Capillary: 154 mg/dL — ABNORMAL HIGH (ref 70–99)
Glucose-Capillary: 70 mg/dL (ref 70–99)
Glucose-Capillary: 113 mg/dL — ABNORMAL HIGH (ref 70–99)

## 2023-10-10 LAB — MAGNESIUM: Magnesium: 1.9 mg/dL (ref 1.7–2.4)

## 2023-10-10 MED ORDER — POTASSIUM CHLORIDE 2 MEQ/ML IV SOLN
INTRAVENOUS | Status: DC
  Filled 2023-10-10 (×2): qty 1000

## 2023-10-10 MED ORDER — DEXTROSE 50 % IV SOLN: 12.5000 g | INTRAVENOUS | Status: AC

## 2023-10-10 MED ORDER — DEXTROSE 50 % IV SOLN
50.0000 mL | INTRAVENOUS | Status: DC | PRN
  Administered 2023-10-10 (×2): 50 mL via INTRAVENOUS
  Filled 2023-10-10 (×3): qty 50

## 2023-10-10 MED ORDER — POTASSIUM CHLORIDE 10 MEQ/100ML IV SOLN
10.0000 meq | INTRAVENOUS | Status: DC
  Administered 2023-10-10 (×2): 10 meq via INTRAVENOUS
  Filled 2023-10-10 (×2): qty 100

## 2023-10-10 MED ORDER — POTASSIUM CHLORIDE 2 MEQ/ML IV SOLN: INTRAVENOUS | Status: DC

## 2023-10-10 MED ORDER — LOPERAMIDE HCL 2 MG PO CAPS
4.0000 mg | ORAL_CAPSULE | Freq: Once | ORAL | Status: AC
  Administered 2023-10-11: 4 mg via ORAL
  Filled 2023-10-10: qty 2

## 2023-10-10 MED ORDER — IOHEXOL 300 MG/ML  SOLN
50.0000 mL | Freq: Once | INTRAMUSCULAR | Status: AC | PRN
  Administered 2023-10-10: 5 mL

## 2023-10-10 NOTE — Progress Notes (Addendum)
 IR progress note  IR request for thoracentesis yesterday. After chest US , there was limited amount of fluid to proceed with a thoracentesis. CT chest/abdomen/pelvis ordered for further evaluation for a possible chest tube placement. CT obtained and imaging reviewed by IR attending Dr. Marlena Sima. At this time, chest tube is not recommended due to a small effusion with pleural thickening. Consider VATS ,  depending on clinical status.   Please contact IR with any questions or concerns.

## 2023-10-10 NOTE — Plan of Care (Signed)

## 2023-10-10 NOTE — Progress Notes (Signed)
 PROGRESS NOTE  Joel Rog Seefeld Sr. ZHY:[id number] DOB: 27-Jul-1945 DOA: 10/08/2023 PCP: [name], MD   LOS: 1 day   Brief narrative:  [name] is a 78 y.o. male with past medical history significant of hypertension, hyperlipidemia, diabetes, GERD, CAD, atrial fibrillation, bradycardia, obesity, RA, asthma to hospital with recurrent shortness of breath and right-sided chest pain.  Of note patient had initially presented to [facility] and underwent cholecystectomy on 08/29/2023 and had a follow-up CT scan which showed fluid collection.  Patient presented on 5/9 with continued abdominal pain and was noted to have gastric outlet obstruction with concern for abscess.  He was then admitted and was seen by general surgery and interventional radiology and had IR placed drain which showed Enterococcus and Enterobacter species and had received IV Zosyn .  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  Drain was also reportedly repositioned on 523.  Patient has had 2 thoracocentesis for this symptoms.  At this time again presented with shortness of breath worse on exertion with right-sided chest pain.  In the ED, patient was notable for hyponatremia with sodium of 131.  Leukocytosis at 12.9.  Case was discussed with interventional radiology and was admitted to hospital for further evaluation and treatment.      Assessment/Plan: Principal Problem:   Recurrent pleural effusion on right Active Problems:   CAD (coronary artery disease)   Essential hypertension   Rheumatoid arthritis involving multiple sites with positive rheumatoid factor (HCC)   Gastroesophageal reflux disease   Type 2 diabetes mellitus with other specified complication (HCC)   Obesity   Asthmatic bronchitis , chronic (HCC)   Atrial fibrillation (HCC)   Hyperlipidemia associated with type 2 diabetes mellitus (HCC)   Recurrent right  pleural effusion Status post cholecystectomy History of intra-abdominal abscess status post drain placement History of recurrent right-sided pleural effusion.  History of drain placement for abdominal abscess following cholecystectomy.  IR on board at this time.  CT scan of the chest abdomen pelvis done on 10/09/2023 showed interval increase in size of loculated at least small volume right pleural effusion with hilar and mediastinal lymphadenopathy.  Stable to slightly increased size of gallbladder fossa collection of free fluid and gas.  Regarding pleural effusion IR does not recommend any chest tube placement and if needed VATS in the future.  Plan for drain injection to discern any fistulous communication.  Culture from previous pleural fluid tapping on 5/21 with no growth.    Eliquis  on hold for potential need for procedure.  Continue pain management and supportive care.  Currently on room air.  Seen by general surgery during hospitalization.  I did speak with the primary surgeon Dr [name] as outpatient on 10/09/2023  Hypertension - Continue home amlodipine  and ramipril .  Blood pressure seems to be stable.   Hyperlipidemia Continue atorvastatin   Diabetes - Continue insulin  pump   GERD - Continue PPI   CAD - Continue home ramipril , Eliquis    Atrial fibrillation - Holding Eliquis  for now   Obesity - Noted   Rheumatoid arthritis - On abatacept  outpatient.   Asthma - Continue home as needed albuterol .  Currently compensated.  Left ankle pain   seen by orthopedics.  Has ankle splint in place at this time.  X-ray shows degenerative tibiotalar spurring.  DVT prophylaxis: Place and maintain sequential compression device Start: 10/08/23 1819   Disposition: Likely home with home health in 2  to 3 days.  Status is: Observation  The patient will require care spanning > 2 midnights and should be moved to inpatient because: Recurrent pleural effusion, need for IR interrogation,, pending  clinical improvement    Code Status:     Code Status: Full Code  Family Communication: Spoke with the patient's son at the bedside again today.  Consultants: Interventional radiology General Surgery   Procedures: Left ankle splint  Anti-infectives:  None yet  Anti-infectives (From admission, onward)    None       Subjective: Today, patient was seen and examined at bedside.  Feels better with his left ankle.  Discomfort in his chest.  Denied overt shortness of breath dyspnea fever chills or coughing.  Objective: Vitals:   10/10/23 0457 10/10/23 0752  BP: (!) 102/55 121/63  Pulse: (!) 52 (!) 59  Resp: 14 17  Temp: 98.3 F (36.8 C) 98.4 F (36.9 C)  SpO2: 99% 96%    Intake/Output Summary (Last 24 hours) at 10/10/2023 1307 Last data filed at 10/09/2023 2047 Gross per 24 hour  Intake --  Output 0 ml  Net 0 ml   Filed Weights   10/08/23 1249  Weight: 81.6 kg   Body mass index is 29.05 kg/m.   Physical Exam:  GENERAL: Patient is alert awake and oriented. Not in obvious distress.  Alert awake and oriented.  Appears slightly deconditioned.  On room air HENT: No scleral pallor or icterus. Pupils equally reactive to light. Oral mucosa is moist NECK: is supple, no gross swelling noted. CHEST: Decreased respirations bilaterally. CVS: S1 and S2 heard, no murmur. Regular rate and rhythm.  ABDOMEN: Soft, non-tender, bowel sounds are present.  Abdominal drain in place with dressing. EXTREMITIES: Left ankle with splint. CNS: Cranial nerves are intact. No focal motor deficits. SKIN: warm and dry without rashes.  Data Review: I have personally reviewed the following laboratory data and studies,  CBC: Recent Labs  Lab 10/05/23 0905 10/05/23 1933 10/08/23 1306 10/09/23 0422 10/10/23 0602  WBC 9.4 10.7* 12.9* 11.0* 8.6  NEUTROABS  --  8.4*  --   --   --   HGB 12.4* 12.3* 11.4* 10.5* 9.9*  HCT 35.7* 36.9* 32.9* 30.9* 29.4*  MCV 86.7 87.6 86.1 85.1 87.0  PLT  473* 450* 405* 389 408*   Basic Metabolic Panel: Recent Labs  Lab 10/05/23 1933 10/08/23 1306 10/09/23 0422 10/10/23 0602  NA 127* 131* 130* 129*  K 5.1 3.7 3.6 3.1*  CL 92* 95* 94* 95*  CO2 22 25 25 25   GLUCOSE 512* 113* 107* 86  BUN 16 8 10 9   CREATININE 1.22 0.97 0.93 0.84  CALCIUM 8.3* 8.3* 8.2* 7.7*  MG  --   --   --  1.9   Liver Function Tests: Recent Labs  Lab 10/05/23 1933 10/09/23 0422 10/10/23 0602  AST 44* 22 18  ALT 32 19 17  ALKPHOS 94 93 79  BILITOT 0.8 0.5 0.6  PROT 6.2* 5.5* 5.2*  ALBUMIN 2.5* 1.9* 1.8*   Recent Labs  Lab 10/05/23 1933  LIPASE 25   No results for input(s): "AMMONIA" in the last 168 hours. Cardiac Enzymes: No results for input(s): "CKTOTAL", "CKMB", "CKMBINDEX", "TROPONINI" in the last 168 hours. BNP (last 3 results) No results for input(s): "BNP" in the last 8760 hours.  ProBNP (last 3 results) No results for input(s): "PROBNP" in the last 8760 hours.  CBG: Recent Labs  Lab 10/09/23 2208 10/10/23 0205 10/10/23 0257 10/10/23 0755 10/10/23  1243  GLUCAP 154* 56* 154* 70 113*   Recent Results (from the past 240 hours)  Body fluid culture w Gram Stain     Status: None   Collection Time: 10/03/23  2:23 PM   Specimen: Lung, Right; Pleural Fluid  Result Value Ref Range Status   Specimen Description PLEURAL  Final   Special Requests RIGHT LUNG  Final   Gram Stain   Final    FEW WBC PRESENT,BOTH PMN AND MONONUCLEAR NO ORGANISMS SEEN    Culture   Final    NO GROWTH 3 DAYS Performed at [facility], [address], [place], [place] [address]    Report Status 10/06/2023 FINAL  Final     Studies: IR US  CHEST Result Date: 10/10/2023 INDICATION: 78 year old male with history of right pleural effusion presents with shortness of breast. Request for therapeutic and diagnostic thoracentesis. EXAM: CHEST ULTRASOUND COMPARISON:  Chest CT 10/05/2023 FINDINGS: Bilateral pleural space was visualized with ultrasound which showed  no pleural effusion in the left and very small, loculated pleural effusion in the right. There was no safe window for ultrasound-guided thoracentesis. IMPRESSION: Small loculated right pleural effusion, no safe window for ultrasound-guided thoracentesis. Thoracentesis was not performed. Performed by: Aimee Han, PA-C Electronically Signed   By: Elene Griffes M.D.   On: 10/10/2023 11:18   CT CHEST ABDOMEN PELVIS W CONTRAST Result Date: 10/09/2023 CLINICAL DATA:  SOB, GB fossa drain in place EXAM: CT CHEST, ABDOMEN, AND PELVIS WITH CONTRAST TECHNIQUE: Multidetector CT imaging of the chest, abdomen and pelvis was performed following the standard protocol during bolus administration of intravenous contrast. RADIATION DOSE REDUCTION: This exam was performed according to the departmental dose-optimization program which includes automated exposure control, adjustment of the mA and/or kV according to patient size and/or use of iterative reconstruction technique. CONTRAST:  75mL OMNIPAQUE  IOHEXOL  350 MG/ML SOLN COMPARISON:  CT chest abdomen pelvis 10/05/2023, CT abdomen pelvis 09/21/2023 FINDINGS: CT CHEST FINDINGS Cardiovascular: Normal heart size. No significant pericardial effusion. The thoracic aorta is normal in caliber. Mild atherosclerotic plaque of the thoracic aorta. At least 3 vessel coronary artery calcifications. Mediastinum/Nodes: Stable mediastinal and right hilar lymphadenopathy with as an example a 1.2 cm precarinal lymph node (3:20) and a 1.1 cm right hilar lymph node (3:20). No left hilar lymph node. Calcified right hilar lymph nodes suggestive of granulomatous disease. No axillary lymph nodes. Thyroid  gland, trachea, and esophagus demonstrate no significant findings. Lungs/Pleura: Punctate calcification in the right lower lobe suggestive of granulomatous disease. Left lower lobe atelectasis. No focal consolidation. No pulmonary nodule. No pulmonary mass. Interval increase in size of a loculated at least  small volume right pleural effusion. Almost complete collapse of the right lower lobe. No left pleural effusion. No pneumothorax. Musculoskeletal: No chest wall abnormality. No suspicious lytic or blastic osseous lesions. No acute displaced fracture. Multilevel degenerative changes of the spine. CT ABDOMEN PELVIS FINDINGS Hepatobiliary: No focal liver abnormality. Status post cholecystectomy. Grossly stable to slightly increased in size 3.7 x 2.2 x 4.5 cm gallbladder fossa collection of free fluid and foci of gas (6:103, 3:54). Trace associated gallbladder fossa/porta hepatics fat stranding. No biliary dilatation. Pancreas: No focal lesion. Normal pancreatic contour. No surrounding inflammatory changes. No main pancreatic ductal dilatation. Spleen: Normal in size without focal abnormality. Adrenals/Urinary Tract: No adrenal nodule bilaterally. Bilateral kidneys enhance symmetrically. Fluid density lesions within the kidneys likely represents a simple renal cysts. Simple renal cysts, in the absence of clinically indicated signs/symptoms, require no independent  follow-up. Subcentimeter hypodensities are too small to characterize-no further follow-up indicated. No hydronephrosis. No hydroureter. No nephroureterolithiasis bilaterally. The urinary bladder is unremarkable. On delayed imaging, there is no urothelial wall thickening and there are no filling defects in the opacified portions of the bilateral collecting systems or ureters. Stomach/Bowel: Stomach is within normal limits. No evidence of bowel wall thickening or dilatation. Colonic diverticulosis. Appendix appears normal. Vascular/Lymphatic: The main portal, splenic, superior mesenteric veins are patent. No abdominal aorta or iliac aneurysm. Mild atherosclerotic plaque of the aorta and its branches. No abdominal, pelvic, or inguinal lymphadenopathy. Reproductive: Prostate is unremarkable. Other: No intraperitoneal free fluid. No intraperitoneal free gas. No  organized fluid collection. Musculoskeletal: No abdominal wall hernia or abnormality. No suspicious lytic or blastic osseous lesions. No acute displaced fracture. Multilevel degenerative changes of the spine. IMPRESSION: 1. Interval increase in size of a loculated at least small volume right pleural effusion. Almost complete collapse of the right lower lobe. 2. Likely reactive in etiology stable right hilar and mediastinal lymphadenopathy. Recommend attention on follow-up. 3. Grossly stable to slightly increased in size 3.7 x 2.2 x 4.5 cm gallbladder fossa collection of free fluid and foci of gas. 4. Colonic diverticulosis with no acute diverticulitis. 5. Sequelae of prior granulomatous disease. 6.  Aortic Atherosclerosis (ICD10-I70.0). Electronically Signed   By: Morgane  Naveau M.D.   On: 10/09/2023 20:30   DG Ankle 2 Views Left Result Date: 10/09/2023 CLINICAL DATA:  Left ankle pain. EXAM: LEFT ANKLE - 2 VIEW COMPARISON:  None Available. FINDINGS: There is no evidence of fracture, dislocation, or joint effusion. Ankle mortise is preserved. Mild tibial talar spurring. Moderate plantar calcaneal spur. No erosions or suspicious bone lesion. No focal soft tissue abnormality. IMPRESSION: 1. Mild degenerative tibiotalar spurring. 2. Moderate plantar calcaneal spur. Electronically Signed   By: Chadwick Colonel M.D.   On: 10/09/2023 15:43   DG Chest 2 View Result Date: 10/08/2023 EXAM: 2 VIEW(S) XRAY OF THE CHEST 10/08/2023 02:27:00 PM COMPARISON: 1 view chest x-ray 10/05/2023 CT of the chest 10/05/2023. CLINICAL HISTORY: Shortness of breath. SOB, hx of bradycardia, hiatal hernia. FINDINGS: LUNGS AND PLEURA: Progressive loculated right pleural effusion and right lower lobe airspace disease are present. Small left pleural effusion and atelectasis are now present. The lung volumes are low. HEART AND MEDIASTINUM: No acute abnormality of the cardiac and mediastinal silhouettes. BONES AND SOFT TISSUES: No acute osseous  abnormality. IMPRESSION: 1. Progressive loculated right pleural effusion and right lower lobe airspace disease. 2. New small left pleural effusion and atelectasis. 3. Low lung volumes. Electronically signed by: Audree Leas MD 10/08/2023 02:38 PM EDT RP Workstation: [id number]      Rosena Conradi, MD  [facility] 10/10/2023  If 7PM-7AM, please contact night-coverage

## 2023-10-10 NOTE — Progress Notes (Signed)
 Repeat BS 154 after IV dextrose . On call MD notified.

## 2023-10-10 NOTE — Procedures (Signed)
  Procedure:  Drain catheter injection  small residual cavity, no biliary opacification, no pleural fistula Preprocedure diagnosis: The encounter diagnosis was Pleural effusion. Postprocedure diagnosis: same EBL:    minimal Complications:   none immediate  See full dictation in [employer].  Nicky Barrack MD Main # [phone] Pager  [phone] Mobile [phone]

## 2023-10-10 NOTE — Progress Notes (Signed)
 Patient blood sugar is 70, patient states that he is diaphoretic. PRN IV dextrose  is given per orders. Will recheck BS, will notify MD. Will continue to monitor.

## 2023-10-10 NOTE — Progress Notes (Signed)
 Repeated BS is 183 after PRN Iv Dextrose , Provider notified.

## 2023-10-10 NOTE — Progress Notes (Signed)
 Patients 0200 BS was 56 and patient diaphoretic. Patient is NPO at this time. MD on call notified, orders received to give IV Dextrose . Will recheck BS and notify on Call MD as instructed. Will continue to monitor this shift.

## 2023-10-11 ENCOUNTER — Inpatient Hospital Stay (HOSPITAL_COMMUNITY)

## 2023-10-11 ENCOUNTER — Other Ambulatory Visit (HOSPITAL_COMMUNITY): Payer: Self-pay

## 2023-10-11 ENCOUNTER — Telehealth (HOSPITAL_COMMUNITY): Payer: Self-pay | Admitting: Pharmacy Technician

## 2023-10-11 ENCOUNTER — Telehealth (INDEPENDENT_AMBULATORY_CARE_PROVIDER_SITE_OTHER): Admitting: General Surgery

## 2023-10-11 DIAGNOSIS — J90 Pleural effusion, not elsewhere classified: Secondary | ICD-10-CM | POA: Diagnosis not present

## 2023-10-11 DIAGNOSIS — Z09 Encounter for follow-up examination after completed treatment for conditions other than malignant neoplasm: Secondary | ICD-10-CM

## 2023-10-11 HISTORY — PX: IR GUIDED DRAIN W CATHETER PLACEMENT: IMG719

## 2023-10-11 LAB — BASIC METABOLIC PANEL WITH GFR
Anion gap: 11 (ref 5–15)
BUN: 11 mg/dL (ref 8–23)
CO2: 24 mmol/L (ref 22–32)
Calcium: 8.3 mg/dL — ABNORMAL LOW (ref 8.9–10.3)
Chloride: 95 mmol/L — ABNORMAL LOW (ref 98–111)
Creatinine, Ser: 1 mg/dL (ref 0.61–1.24)
GFR, Estimated: >60 mL/min (ref >60)
Glucose, Bld: 363 mg/dL — ABNORMAL HIGH (ref 70–99)
Potassium: 3.6 mmol/L (ref 3.5–5.1)
Sodium: 130 mmol/L — ABNORMAL LOW (ref 135–145)

## 2023-10-11 LAB — GLUCOSE, CAPILLARY
Glucose-Capillary: 300 mg/dL — ABNORMAL HIGH (ref 70–99)
Glucose-Capillary: 401 mg/dL — ABNORMAL HIGH (ref 70–99)
Glucose-Capillary: 403 mg/dL — ABNORMAL HIGH (ref 70–99)
Glucose-Capillary: 317 mg/dL — ABNORMAL HIGH (ref 70–99)
Glucose-Capillary: 256 mg/dL — ABNORMAL HIGH (ref 70–99)

## 2023-10-11 MED ORDER — INSULIN ASPART 100 UNIT/ML IJ SOLN
0.0000 [IU] | INTRAMUSCULAR | Status: DC
  Administered 2023-10-11: 3 [IU] via SUBCUTANEOUS
  Administered 2023-10-12: 1 [IU] via SUBCUTANEOUS
  Administered 2023-10-12: 3 [IU] via SUBCUTANEOUS
  Administered 2023-10-12: 2 [IU] via SUBCUTANEOUS
  Administered 2023-10-12 (×2): 1 [IU] via SUBCUTANEOUS
  Administered 2023-10-12: 2 [IU] via SUBCUTANEOUS
  Administered 2023-10-13 – 2023-10-14 (×2): 1 [IU] via SUBCUTANEOUS
  Administered 2023-10-14: 4 [IU] via SUBCUTANEOUS
  Administered 2023-10-14: 6 [IU] via SUBCUTANEOUS
  Administered 2023-10-14: 3 [IU] via SUBCUTANEOUS

## 2023-10-11 MED ORDER — INSULIN GLARGINE-YFGN 100 UNIT/ML ~~LOC~~ SOLN
28.0000 [IU] | Freq: Every day | SUBCUTANEOUS | Status: DC
  Administered 2023-10-11 – 2023-10-13 (×3): 28 [IU] via SUBCUTANEOUS
  Filled 2023-10-11 (×5): qty 0.28

## 2023-10-11 MED ORDER — IOHEXOL 300 MG/ML  SOLN
50.0000 mL | Freq: Once | INTRAMUSCULAR | Status: AC | PRN
  Administered 2023-10-11: 15 mL

## 2023-10-11 MED ORDER — LIDOCAINE HCL 1 % IJ SOLN
INTRAMUSCULAR | Status: AC
Start: 2023-10-11 — End: ?
  Filled 2023-10-11: qty 20

## 2023-10-11 MED ORDER — INSULIN ASPART 100 UNIT/ML IJ SOLN
3.0000 [IU] | Freq: Three times a day (TID) | INTRAMUSCULAR | Status: AC
Start: 2023-10-11 — End: ?
  Administered 2023-10-12 – 2023-10-15 (×6): 3 [IU] via SUBCUTANEOUS

## 2023-10-11 MED ORDER — LIDOCAINE-EPINEPHRINE 1 %-1:100000 IJ SOLN
20.0000 mL | Freq: Once | INTRAMUSCULAR | Status: AC
  Administered 2023-10-11: 10 mL via INTRADERMAL

## 2023-10-11 MED ORDER — ONDANSETRON HCL 4 MG/2ML IJ SOLN
4.0000 mg | Freq: Four times a day (QID) | INTRAMUSCULAR | Status: DC | PRN
  Administered 2023-10-11: 4 mg via INTRAVENOUS
  Filled 2023-10-11: qty 2

## 2023-10-11 MED ORDER — INSULIN ASPART 100 UNIT/ML IJ SOLN
8.0000 [IU] | Freq: Once | INTRAMUSCULAR | Status: AC
  Administered 2023-10-11: 8 [IU] via SUBCUTANEOUS

## 2023-10-11 NOTE — Plan of Care (Signed)

## 2023-10-11 NOTE — Procedures (Signed)
 Interventional Radiology Procedure Note  Procedure:  1) Image guided biloma drain placement 2) Removal of previously placed subhepatic drain  Findings: Please refer to procedural dictation for full description.  Well defined fluid collection in gallbladder fossa.  Similar to CT, drain positioned just outside of fluid collection.  New 8 Fr mini loop drain placed into biloma with similar trajectory as indwelling drain.  Approximately 30 mL bilious fluid aspirated.  Indwelling drain removed. Drain placed to bag drainage.  Complications: None immediate  Estimated Blood Loss: < 5 ml  Recommendations: IR will continue to follow.   Likely can arrange outpatient drain clinic follow up in 1-2 weeks if discharging home soon.   Creasie Doctor, MD

## 2023-10-11 NOTE — Plan of Care (Signed)

## 2023-10-11 NOTE — Progress Notes (Signed)
 PROGRESS NOTE  Joel Skelley Frisk Sr. ZOX:[id number] DOB: 11/16/1945 DOA: 10/08/2023 PCP: [name], MD   LOS: 2 days   Brief narrative:  Joel Patches Culliton Sr. is a 78 y.o. male with past medical history significant of hypertension, hyperlipidemia, diabetes, GERD, CAD, atrial fibrillation, bradycardia, obesity, RA, asthma to hospital with recurrent shortness of breath and right-sided chest pain.  Of note patient had initially presented to [facility] and underwent cholecystectomy on 08/29/2023 and had a follow-up CT scan which showed fluid collection.  Patient presented on 5/9 with continued abdominal pain and was noted to have gastric outlet obstruction with concern for abscess.  He was then admitted and was seen by general surgery and interventional radiology and had IR placed drain which showed Enterococcus and Enterobacter species and had received IV Zosyn .  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  Drain was also reportedly repositioned on 523.  Patient has had 2 thoracocentesis for this symptoms.  At this time patient presented with shortness of breath worse on exertion with right-sided chest pain.  In the ED, patient was notable for hyponatremia with sodium of 131.  Leukocytosis at 12.9.  Case was discussed with interventional radiology and was admitted to hospital for further evaluation and treatment.      Assessment/Plan: Principal Problem:   Recurrent pleural effusion on right Active Problems:   CAD (coronary artery disease)   Essential hypertension   Rheumatoid arthritis involving multiple sites with positive rheumatoid factor (HCC)   Gastroesophageal reflux disease   Type 2 diabetes mellitus with other specified complication (HCC)   Obesity   Asthmatic bronchitis , chronic (HCC)   Atrial fibrillation (HCC)   Hyperlipidemia associated with type 2 diabetes mellitus (HCC)   Recurrent  right pleural effusion Status post cholecystectomy History of intra-abdominal abscess status post drain placement History of recurrent right-sided pleural effusion.  History of drain placement for abdominal abscess following cholecystectomy.  IR on board at this time.  CT scan of the chest abdomen pelvis done on 10/09/2023 showed interval increase in size of loculated at least small volume right pleural effusion with hilar and mediastinal lymphadenopathy.  Stable to slightly increased size of gallbladder fossa collection of free fluid and gas.  Regarding pleural effusion IR does not recommend any chest tube placement and if needed VATS in the future.  Patient underwent drain injection to discern any fistulous communication without any findings of fistulous communication..  Culture from previous pleural fluid tapping on 5/21 with no growth.    Eliquis  on hold for potential need for procedure.  Patient denies dyspnea shortness of breath or chest pain at this time.   Currently on room air.  Seen by general surgery during hospitalization.  I did speak with the primary surgeon Dr [name] as outpatient on 10/09/2023 no surgical intervention indicated at this time.  I did reach out to pulmonary critical care team for loculated effusion on the right side.  Will follow pulmonary recommendations.  Hypertension - Continue home amlodipine  and ramipril .  Blood pressure seems to be stable.   Hyperlipidemia Continue atorvastatin   Diabetes - Continue insulin  pump   GERD - Continue PPI   CAD - Continue home ramipril , Eliquis    Atrial fibrillation - Holding Eliquis  for now   Obesity - Noted   Rheumatoid arthritis - On abatacept  outpatient.   Asthma - Continue home as needed albuterol .  Currently compensated.  Left ankle pain  seen by orthopedics.  Has ankle splint in place at this time.  X-ray shows degenerative tibiotalar spurring.  DVT prophylaxis: Place and maintain sequential compression device  Start: 10/08/23 1819   Disposition: Likely home with home health in 1 to 2 days.  Status is: Inpatient  The patient is inpatient because recurrent pleural effusion, post IR interrogation.    Code Status:     Code Status: Full Code  Family Communication: Spoke with the patient's elder son at the bedside today.  Consultants: Interventional radiology General Surgery   Procedures: Left ankle splint  Anti-infectives:  None yet  Anti-infectives (From admission, onward)    None       Subjective: Today, patient was seen and examined at bedside.  Patient denies any chest pain, shortness of breath, dyspnea.  No fever chills or rigors.  Feels better with his ankle.  Objective: Vitals:   10/11/23 0400 10/11/23 0807  BP: (!) 120/58 (!) 120/51  Pulse: 62 64  Resp:  19  Temp: 98.1 F (36.7 C) 98.1 F (36.7 C)  SpO2: 92% 98%    Intake/Output Summary (Last 24 hours) at 10/11/2023 1419 Last data filed at 10/11/2023 1341 Gross per 24 hour  Intake 1198.21 ml  Output 0 ml  Net 1198.21 ml   Filed Weights   10/08/23 1249  Weight: 81.6 kg   Body mass index is 29.05 kg/m.   Physical Exam:  GENERAL: Patient is alert awake and oriented. Not in obvious distress.  Alert awake and oriented.  Appears slightly deconditioned.  On room air HENT: No scleral pallor or icterus. Pupils equally reactive to light. Oral mucosa is moist NECK: is supple, no gross swelling noted. CHEST: Decreased respirations bilaterally. CVS: S1 and S2 heard, no murmur. Regular rate and rhythm.  ABDOMEN: Soft, non-tender, bowel sounds are present.  Abdominal drain in place with dressing.  No obvious drainage noted. EXTREMITIES: Left ankle with splint. CNS: Cranial nerves are intact. No focal motor deficits. SKIN: warm and dry without rashes.  Data Review: I have personally reviewed the following laboratory data and studies,  CBC: Recent Labs  Lab 10/05/23 0905 10/05/23 1933 10/08/23 1306  10/09/23 0422 10/10/23 0602  WBC 9.4 10.7* 12.9* 11.0* 8.6  NEUTROABS  --  8.4*  --   --   --   HGB 12.4* 12.3* 11.4* 10.5* 9.9*  HCT 35.7* 36.9* 32.9* 30.9* 29.4*  MCV 86.7 87.6 86.1 85.1 87.0  PLT 473* 450* 405* 389 408*   Basic Metabolic Panel: Recent Labs  Lab 10/05/23 1933 10/08/23 1306 10/09/23 0422 10/10/23 0602  NA 127* 131* 130* 129*  K 5.1 3.7 3.6 3.1*  CL 92* 95* 94* 95*  CO2 22 25 25 25   GLUCOSE 512* 113* 107* 86  BUN 16 8 10 9   CREATININE 1.22 0.97 0.93 0.84  CALCIUM 8.3* 8.3* 8.2* 7.7*  MG  --   --   --  1.9   Liver Function Tests: Recent Labs  Lab 10/05/23 1933 10/09/23 0422 10/10/23 0602  AST 44* 22 18  ALT 32 19 17  ALKPHOS 94 93 79  BILITOT 0.8 0.5 0.6  PROT 6.2* 5.5* 5.2*  ALBUMIN 2.5* 1.9* 1.8*   Recent Labs  Lab 10/05/23 1933  LIPASE 25   No results for input(s): "AMMONIA" in the last 168 hours. Cardiac Enzymes: No results for input(s): "CKTOTAL", "CKMB", "CKMBINDEX", "TROPONINI" in the last 168 hours. BNP (last 3 results) No results for input(s): "BNP" in the last 8760 hours.  ProBNP (last 3 results) No results for input(s): "PROBNP" in the last 8760 hours.  CBG: Recent Labs  Lab 10/10/23 0257 10/10/23 0755 10/10/23 1243 10/11/23 0840 10/11/23 1220  GLUCAP 154* 70 113* 300* 401*   Recent Results (from the past 240 hours)  Body fluid culture w Gram Stain     Status: None   Collection Time: 10/03/23  2:23 PM   Specimen: Lung, Right; Pleural Fluid  Result Value Ref Range Status   Specimen Description PLEURAL  Final   Special Requests RIGHT LUNG  Final   Gram Stain   Final    FEW WBC PRESENT,BOTH PMN AND MONONUCLEAR NO ORGANISMS SEEN    Culture   Final    NO GROWTH 3 DAYS Performed at [facility], [address] [place], [place] [address]    Report Status 10/06/2023 FINAL  Final     Studies: IR INJECT INDWELLING DRAINAGE CATHETER Result Date: 10/10/2023 CLINICAL DATA:  Prior cholecystectomy, subsequent  gallbladder fossa collection, post percutaneous drain catheter placement 09/22/2023. EXAM: SINUS TRACT INJECTION/FISTULOGRAM ANESTHESIA/SEDATION: None required MEDICATIONS: No periprocedural antibiotics were indicated CONTRAST:  5mL OMNIPAQUE  IOHEXOL  300 MG/ML  SOLN PROCEDURE: The procedure, risks (including but not limited to bleeding, infection, organ damage ), benefits, and alternatives were explained to the patient. Questions regarding the procedure were encouraged and answered. The patient understands and consents to the procedure. Under fluoroscopy, contrast was gently injected into the right upper quadrant drain catheter. COMPLICATIONS: None immediate FINDINGS: Initially, contrast tracked back along the course of the catheter exiting at the skin entry site. With slightly more vigorous injection, filling of a collapsed cavity was seen in the gallbladder fossa. No pleural fistula noted. IMPRESSION: 1. No fistula identified. 2. Resolution of subhepatic collection. Electronically Signed   By: Nicoletta Barrier M.D.   On: 10/10/2023 16:16   IR US  CHEST Result Date: 10/10/2023 INDICATION: 78 year old male with history of right pleural effusion presents with shortness of breast. Request for therapeutic and diagnostic thoracentesis. EXAM: CHEST ULTRASOUND COMPARISON:  Chest CT 10/05/2023 FINDINGS: Bilateral pleural space was visualized with ultrasound which showed no pleural effusion in the left and very small, loculated pleural effusion in the right. There was no safe window for ultrasound-guided thoracentesis. IMPRESSION: Small loculated right pleural effusion, no safe window for ultrasound-guided thoracentesis. Thoracentesis was not performed. Performed by: Aimee Han, PA-C Electronically Signed   By: Elene Griffes M.D.   On: 10/10/2023 11:18   CT CHEST ABDOMEN PELVIS W CONTRAST Result Date: 10/09/2023 CLINICAL DATA:  SOB, GB fossa drain in place EXAM: CT CHEST, ABDOMEN, AND PELVIS WITH CONTRAST TECHNIQUE:  Multidetector CT imaging of the chest, abdomen and pelvis was performed following the standard protocol during bolus administration of intravenous contrast. RADIATION DOSE REDUCTION: This exam was performed according to the departmental dose-optimization program which includes automated exposure control, adjustment of the mA and/or kV according to patient size and/or use of iterative reconstruction technique. CONTRAST:  75mL OMNIPAQUE  IOHEXOL  350 MG/ML SOLN COMPARISON:  CT chest abdomen pelvis 10/05/2023, CT abdomen pelvis 09/21/2023 FINDINGS: CT CHEST FINDINGS Cardiovascular: Normal heart size. No significant pericardial effusion. The thoracic aorta is normal in caliber. Mild atherosclerotic plaque of the thoracic aorta. At least 3 vessel coronary artery calcifications. Mediastinum/Nodes: Stable mediastinal and right hilar lymphadenopathy with as an example a 1.2 cm precarinal lymph node (3:20) and a 1.1 cm right hilar lymph node (3:20). No left hilar lymph node. Calcified right hilar lymph nodes suggestive of granulomatous disease. No axillary  lymph nodes. Thyroid  gland, trachea, and esophagus demonstrate no significant findings. Lungs/Pleura: Punctate calcification in the right lower lobe suggestive of granulomatous disease. Left lower lobe atelectasis. No focal consolidation. No pulmonary nodule. No pulmonary mass. Interval increase in size of a loculated at least small volume right pleural effusion. Almost complete collapse of the right lower lobe. No left pleural effusion. No pneumothorax. Musculoskeletal: No chest wall abnormality. No suspicious lytic or blastic osseous lesions. No acute displaced fracture. Multilevel degenerative changes of the spine. CT ABDOMEN PELVIS FINDINGS Hepatobiliary: No focal liver abnormality. Status post cholecystectomy. Grossly stable to slightly increased in size 3.7 x 2.2 x 4.5 cm gallbladder fossa collection of free fluid and foci of gas (6:103, 3:54). Trace associated  gallbladder fossa/porta hepatics fat stranding. No biliary dilatation. Pancreas: No focal lesion. Normal pancreatic contour. No surrounding inflammatory changes. No main pancreatic ductal dilatation. Spleen: Normal in size without focal abnormality. Adrenals/Urinary Tract: No adrenal nodule bilaterally. Bilateral kidneys enhance symmetrically. Fluid density lesions within the kidneys likely represents a simple renal cysts. Simple renal cysts, in the absence of clinically indicated signs/symptoms, require no independent follow-up. Subcentimeter hypodensities are too small to characterize-no further follow-up indicated. No hydronephrosis. No hydroureter. No nephroureterolithiasis bilaterally. The urinary bladder is unremarkable. On delayed imaging, there is no urothelial wall thickening and there are no filling defects in the opacified portions of the bilateral collecting systems or ureters. Stomach/Bowel: Stomach is within normal limits. No evidence of bowel wall thickening or dilatation. Colonic diverticulosis. Appendix appears normal. Vascular/Lymphatic: The main portal, splenic, superior mesenteric veins are patent. No abdominal aorta or iliac aneurysm. Mild atherosclerotic plaque of the aorta and its branches. No abdominal, pelvic, or inguinal lymphadenopathy. Reproductive: Prostate is unremarkable. Other: No intraperitoneal free fluid. No intraperitoneal free gas. No organized fluid collection. Musculoskeletal: No abdominal wall hernia or abnormality. No suspicious lytic or blastic osseous lesions. No acute displaced fracture. Multilevel degenerative changes of the spine. IMPRESSION: 1. Interval increase in size of a loculated at least small volume right pleural effusion. Almost complete collapse of the right lower lobe. 2. Likely reactive in etiology stable right hilar and mediastinal lymphadenopathy. Recommend attention on follow-up. 3. Grossly stable to slightly increased in size 3.7 x 2.2 x 4.5 cm  gallbladder fossa collection of free fluid and foci of gas. 4. Colonic diverticulosis with no acute diverticulitis. 5. Sequelae of prior granulomatous disease. 6.  Aortic Atherosclerosis (ICD10-I70.0). Electronically Signed   By: Morgane  Naveau M.D.   On: 10/09/2023 20:30      Rosena Conradi, MD  [facility] 10/11/2023  If 7PM-7AM, please contact night-coverage

## 2023-10-11 NOTE — Progress Notes (Signed)
 Patient ID: Joel Grieder Ortlieb Sr., male   DOB: 03-01-1946, 78 y.o.   MRN: [id number]    Referring Physician(s): Dr. Rosena Conradi  Supervising Physician: Creasie Doctor  Patient Status:  [facility] - In-pt  Chief Complaint:  S/p gallbladder fossa drain 09/22/23, drain evaluation  Subjective:  Pt had cholecystectomy 4/23, then gallbladder fluid collection and drain placed 5/10. Has had concern for potential fistula connecting to pleural space d/t fluid accumulating in right chest. Had drain injection 5/20 showing small fistula communication to small right hepatic duct from gallbladder fossa. Had repeat drain injection yesterday 5/28 showing no fistula and resolution of subhepatic collection.   Pt now in the hospital for repeat visit x2 days. Has had 0 recorded output.   Case fiscussed with Dr. Jinx Mourning, who wants to evaluate drain with US  today to ensure proper position and no visual biloma. IR outpatient plan pending findings today. Hospitalist planning DC as early as this evening.   Allergies: Codeine, Lipitor [atorvastatin], Invokana [canagliflozin], Losartan, Roxicodone  [oxycodone ], and Vicodin [hydrocodone -acetaminophen ]  Medications: Prior to Admission medications   Medication Sig Start Date End Date Taking? Authorizing Provider  albuterol  (PROVENTIL ) (2.5 MG/3ML) 0.083% nebulizer solution Take 3 mLs (2.5 mg total) by nebulization every 6 (six) hours as needed for wheezing or shortness of breath. 10/02/23  Yes [name], MD  amLODipine  (NORVASC ) 5 MG tablet Take 1 tablet (5 mg total) by mouth daily. Patient taking differently: Take 5 mg by mouth in the morning. 09/04/23  Yes [name], MD  amoxicillin -clavulanate (AUGMENTIN ) 875-125 MG tablet Take 1 tablet by mouth 2 (two) times daily.   Yes [provider]  apixaban  (ELIQUIS ) 5 MG TABS tablet Take 1 tablet (5 mg total) by mouth 2 (two) times daily. 07/16/23  Yes Lenise Quince, MD  ascorbic acid  (VITAMIN C ) 500 MG tablet  Take 500 mg by mouth in the morning.   Yes [provider]  calcium carbonate (OSCAL) 1500 (600 Ca) MG TABS tablet Take 600 mg of elemental calcium by mouth in the morning.   Yes [provider]  calcium carbonate (TUMS - DOSED IN MG ELEMENTAL CALCIUM) 500 MG chewable tablet Chew 1 tablet by mouth as needed for indigestion or heartburn.   Yes [provider]  cholecalciferol (VITAMIN D3) 25 MCG (1000 UNIT) tablet Take 1,000 Units by mouth in the morning.   Yes [provider]  Insulin  Aspart, w/Niacinamide, (FIASP ) 100 UNIT/ML SOLN 77 Units continuous. Insulin  pump 09/27/22  Yes [provider]  insulin  glargine (LANTUS ) 100 UNIT/ML injection Inject 16 Units into the skin as needed (If pump goes out).   Yes [provider]  KRILL OIL PO Take 400 mg by mouth in the morning.   Yes [provider]  loratadine  (CLARITIN ) 10 MG tablet Take 10 mg by mouth in the morning.   Yes [provider]  methocarbamol  (ROBAXIN ) 500 MG tablet Take 1 tablet (500 mg total) by mouth every 12 (twelve) hours as needed for muscle spasms. Patient taking differently: Take 500 mg by mouth every 6 (six) hours as needed for muscle spasms. 09/28/23  Yes [name], MD  Multiple Vitamin (MULTIVITAMIN WITH MINERALS) TABS Take 1 tablet by mouth in the morning. Centrum   Yes [provider]  ondansetron  (ZOFRAN -ODT) 4 MG disintegrating tablet Take 1 tablet (4 mg total) by mouth every 8 (eight) hours as needed for nausea or vomiting. 09/09/23  Yes Ninetta Basket, MD  pantoprazole  (PROTONIX ) 40  MG tablet TAKE 1 TABLET BY MOUTH DAILY Patient taking differently: Take 40 mg by mouth in the morning. 06/27/23  Yes Mealor, Augustus E, MD  Potassium 99 MG TABS Take 99 mg by mouth in the morning.   Yes [provider]  pyridOXINE (VITAMIN B6) 50 MG tablet Take 50 mg by mouth in the morning.   Yes [provider]  ramipril  (ALTACE ) 5 MG  capsule Take 1 capsule (5 mg total) by mouth daily. Patient taking differently: Take 5 mg by mouth in the morning. 09/04/23  Yes [name], MD  traMADol  (ULTRAM ) 50 MG tablet Take 1 tablet (50 mg total) by mouth every 6 (six) hours as needed for moderate pain (pain score 4-6). 09/25/23  Yes [place], Eric J, DO  vitamin E  400 UNIT capsule Take 400 Units by mouth in the morning.   Yes [provider]  zinc gluconate 50 MG tablet Take 50 mg by mouth in the morning.   Yes [provider]  Abatacept  (ORENCIA  IV) Inject 750 mg into the vein every 28 (twenty-eight) days. Patient not taking: Reported on 10/08/2023    [provider]  albuterol  (VENTOLIN  HFA) 108 (90 Base) MCG/ACT inhaler Inhale 2 puffs into the lungs every 6 (six) hours as needed for wheezing or shortness of breath. Patient not taking: Reported on 10/08/2023 10/01/23   [name], MD  ONE TOUCH ULTRA TEST test strip 1 each daily. Pt says 2-3 times daily 05/26/16   [provider]  pravastatin  (PRAVACHOL ) 40 MG tablet Take 40 mg by mouth in the morning. Patient not taking: Reported on 10/08/2023    [provider]     Vital Signs: BP (!) 120/51 (BP Location: Left Arm)   Pulse 64   Temp 98.1 F (36.7 C)   Resp 19   Ht 5\' 6"  (1.676 m)   Wt 180 lb (81.6 kg)   SpO2 98%   BMI 29.05 kg/m   Physical Exam Vitals and nursing note reviewed.  Constitutional:      General: He is not in acute distress. Cardiovascular:     Rate and Rhythm: Normal rate.  Pulmonary:     Effort: Pulmonary effort is normal.  Abdominal:     Palpations: Abdomen is soft.     Tenderness: There is no abdominal tenderness.     Comments: RUQ drain to bag with no output. Small amount of blood tinged fluid in proximal drain tube, none in bag. No overlying abnormality.  Musculoskeletal:     Right lower leg: No edema.     Left lower leg: No edema.  Skin:    General: Skin is warm and dry.  Neurological:      Mental Status: He is alert and oriented to person, place, and time. Mental status is at baseline.     Imaging: IR INJECT INDWELLING DRAINAGE CATHETER Result Date: 10/10/2023 CLINICAL DATA:  Prior cholecystectomy, subsequent gallbladder fossa collection, post percutaneous drain catheter placement 09/22/2023. EXAM: SINUS TRACT INJECTION/FISTULOGRAM ANESTHESIA/SEDATION: None required MEDICATIONS: No periprocedural antibiotics were indicated CONTRAST:  5mL OMNIPAQUE  IOHEXOL  300 MG/ML  SOLN PROCEDURE: The procedure, risks (including but not limited to bleeding, infection, organ damage ), benefits, and alternatives were explained to the patient. Questions regarding the procedure were encouraged and answered. The patient understands and consents to the procedure. Under fluoroscopy, contrast was gently injected into the right upper quadrant drain catheter. COMPLICATIONS: None immediate FINDINGS: Initially, contrast tracked back along the course of the catheter exiting  at the skin entry site. With slightly more vigorous injection, filling of a collapsed cavity was seen in the gallbladder fossa. No pleural fistula noted. IMPRESSION: 1. No fistula identified. 2. Resolution of subhepatic collection. Electronically Signed   By: Nicoletta Barrier M.D.   On: 10/10/2023 16:16   IR US  CHEST Result Date: 10/10/2023 INDICATION: 78 year old male with history of right pleural effusion presents with shortness of breast. Request for therapeutic and diagnostic thoracentesis. EXAM: CHEST ULTRASOUND COMPARISON:  Chest CT 10/05/2023 FINDINGS: Bilateral pleural space was visualized with ultrasound which showed no pleural effusion in the left and very small, loculated pleural effusion in the right. There was no safe window for ultrasound-guided thoracentesis. IMPRESSION: Small loculated right pleural effusion, no safe window for ultrasound-guided thoracentesis. Thoracentesis was not performed. Performed by: Aimee Han, PA-C Electronically  Signed   By: Elene Griffes M.D.   On: 10/10/2023 11:18   CT CHEST ABDOMEN PELVIS W CONTRAST Result Date: 10/09/2023 CLINICAL DATA:  SOB, GB fossa drain in place EXAM: CT CHEST, ABDOMEN, AND PELVIS WITH CONTRAST TECHNIQUE: Multidetector CT imaging of the chest, abdomen and pelvis was performed following the standard protocol during bolus administration of intravenous contrast. RADIATION DOSE REDUCTION: This exam was performed according to the departmental dose-optimization program which includes automated exposure control, adjustment of the mA and/or kV according to patient size and/or use of iterative reconstruction technique. CONTRAST:  75mL OMNIPAQUE  IOHEXOL  350 MG/ML SOLN COMPARISON:  CT chest abdomen pelvis 10/05/2023, CT abdomen pelvis 09/21/2023 FINDINGS: CT CHEST FINDINGS Cardiovascular: Normal heart size. No significant pericardial effusion. The thoracic aorta is normal in caliber. Mild atherosclerotic plaque of the thoracic aorta. At least 3 vessel coronary artery calcifications. Mediastinum/Nodes: Stable mediastinal and right hilar lymphadenopathy with as an example a 1.2 cm precarinal lymph node (3:20) and a 1.1 cm right hilar lymph node (3:20). No left hilar lymph node. Calcified right hilar lymph nodes suggestive of granulomatous disease. No axillary lymph nodes. Thyroid  gland, trachea, and esophagus demonstrate no significant findings. Lungs/Pleura: Punctate calcification in the right lower lobe suggestive of granulomatous disease. Left lower lobe atelectasis. No focal consolidation. No pulmonary nodule. No pulmonary mass. Interval increase in size of a loculated at least small volume right pleural effusion. Almost complete collapse of the right lower lobe. No left pleural effusion. No pneumothorax. Musculoskeletal: No chest wall abnormality. No suspicious lytic or blastic osseous lesions. No acute displaced fracture. Multilevel degenerative changes of the spine. CT ABDOMEN PELVIS FINDINGS  Hepatobiliary: No focal liver abnormality. Status post cholecystectomy. Grossly stable to slightly increased in size 3.7 x 2.2 x 4.5 cm gallbladder fossa collection of free fluid and foci of gas (6:103, 3:54). Trace associated gallbladder fossa/porta hepatics fat stranding. No biliary dilatation. Pancreas: No focal lesion. Normal pancreatic contour. No surrounding inflammatory changes. No main pancreatic ductal dilatation. Spleen: Normal in size without focal abnormality. Adrenals/Urinary Tract: No adrenal nodule bilaterally. Bilateral kidneys enhance symmetrically. Fluid density lesions within the kidneys likely represents a simple renal cysts. Simple renal cysts, in the absence of clinically indicated signs/symptoms, require no independent follow-up. Subcentimeter hypodensities are too small to characterize-no further follow-up indicated. No hydronephrosis. No hydroureter. No nephroureterolithiasis bilaterally. The urinary bladder is unremarkable. On delayed imaging, there is no urothelial wall thickening and there are no filling defects in the opacified portions of the bilateral collecting systems or ureters. Stomach/Bowel: Stomach is within normal limits. No evidence of bowel wall thickening or dilatation. Colonic diverticulosis. Appendix appears normal. Vascular/Lymphatic: The main portal, splenic, superior mesenteric  veins are patent. No abdominal aorta or iliac aneurysm. Mild atherosclerotic plaque of the aorta and its branches. No abdominal, pelvic, or inguinal lymphadenopathy. Reproductive: Prostate is unremarkable. Other: No intraperitoneal free fluid. No intraperitoneal free gas. No organized fluid collection. Musculoskeletal: No abdominal wall hernia or abnormality. No suspicious lytic or blastic osseous lesions. No acute displaced fracture. Multilevel degenerative changes of the spine. IMPRESSION: 1. Interval increase in size of a loculated at least small volume right pleural effusion. Almost complete  collapse of the right lower lobe. 2. Likely reactive in etiology stable right hilar and mediastinal lymphadenopathy. Recommend attention on follow-up. 3. Grossly stable to slightly increased in size 3.7 x 2.2 x 4.5 cm gallbladder fossa collection of free fluid and foci of gas. 4. Colonic diverticulosis with no acute diverticulitis. 5. Sequelae of prior granulomatous disease. 6.  Aortic Atherosclerosis (ICD10-I70.0). Electronically Signed   By: Morgane  Naveau M.D.   On: 10/09/2023 20:30   DG Ankle 2 Views Left Result Date: 10/09/2023 CLINICAL DATA:  Left ankle pain. EXAM: LEFT ANKLE - 2 VIEW COMPARISON:  None Available. FINDINGS: There is no evidence of fracture, dislocation, or joint effusion. Ankle mortise is preserved. Mild tibial talar spurring. Moderate plantar calcaneal spur. No erosions or suspicious bone lesion. No focal soft tissue abnormality. IMPRESSION: 1. Mild degenerative tibiotalar spurring. 2. Moderate plantar calcaneal spur. Electronically Signed   By: Chadwick Colonel M.D.   On: 10/09/2023 15:43   DG Chest 2 View Result Date: 10/08/2023 EXAM: 2 VIEW(S) XRAY OF THE CHEST 10/08/2023 02:27:00 PM COMPARISON: 1 view chest x-ray 10/05/2023 CT of the chest 10/05/2023. CLINICAL HISTORY: Shortness of breath. SOB, hx of bradycardia, hiatal hernia. FINDINGS: LUNGS AND PLEURA: Progressive loculated right pleural effusion and right lower lobe airspace disease are present. Small left pleural effusion and atelectasis are now present. The lung volumes are low. HEART AND MEDIASTINUM: No acute abnormality of the cardiac and mediastinal silhouettes. BONES AND SOFT TISSUES: No acute osseous abnormality. IMPRESSION: 1. Progressive loculated right pleural effusion and right lower lobe airspace disease. 2. New small left pleural effusion and atelectasis. 3. Low lung volumes. Electronically signed by: Audree Leas MD 10/08/2023 02:38 PM EDT RP Workstation: [id number]    Labs:  CBC: Recent Labs     10/05/23 1933 10/08/23 1306 10/09/23 0422 10/10/23 0602  WBC 10.7* 12.9* 11.0* 8.6  HGB 12.3* 11.4* 10.5* 9.9*  HCT 36.9* 32.9* 30.9* 29.4*  PLT 450* 405* 389 408*    COAGS: Recent Labs    05/28/23 1737 06/01/23 1343 08/13/23 0852 08/17/23 1413 09/22/23 0359 10/05/23 0905  INR 2.8*   < > 2.3 1.1 1.2 1.2  APTT 50*  --   --   --   --   --    < > = values in this interval not displayed.    BMP: Recent Labs    10/05/23 1933 10/08/23 1306 10/09/23 0422 10/10/23 0602  NA 127* 131* 130* 129*  K 5.1 3.7 3.6 3.1*  CL 92* 95* 94* 95*  CO2 22 25 25 25   GLUCOSE 512* 113* 107* 86  BUN 16 8 10 9   CALCIUM 8.3* 8.3* 8.2* 7.7*  CREATININE 1.22 0.97 0.93 0.84  GFRNONAA >60 >60 >60 >60    LIVER FUNCTION TESTS: Recent Labs    09/21/23 0734 10/05/23 1933 10/09/23 0422 10/10/23 0602  BILITOT 1.0 0.8 0.5 0.6  AST 34 44* 22 18  ALT 20 32 19 17  ALKPHOS 99 94 93 79  PROT 6.7 6.2* 5.5*  5.2*  ALBUMIN 2.9* 2.5* 1.9* 1.8*    Assessment and Plan:  S/p gallbladder fossa drain 09/22/23, repeat drain injections, most recent yesterday showing no fistula connection.   - drain today with no output in bag. Appears unremarkable - plan for drain evaluation today with Dr. Jinx Mourning with US .  - IR outpatient plan pending findings from todays eval - Possibility for drain removal or drain manipulation today - IR will coordinate outpatient follow up as needed if pt leaves with drain, already has IR outpatient order in place from recent evaluation on 5/21  Electronically Signed: Nicolasa Barrett, PA-C 10/11/2023, 4:19 PM   I spent a total of 15 Minutes at the the patient's bedside AND on the patient's hospital floor or unit, greater than 50% of which was counseling/coordinating care for gallbladder fossa drain follow up.

## 2023-10-11 NOTE — Telephone Encounter (Signed)
 Returned a phone call I received from Wendy Schlechter, patient's daughter.  We reviewed the patient's course since undergoing a robotic assisted laparoscopic cholecystectomy on 09/05/2023.  I tried to answer any questions she had to the best of my ability.  She reports patient is improving.  She understands that I have not been able to directly coordinate his care as I do not have privileges at [facility].  I told her to call me should any questions or problems arise.  I will see him in follow-up once he is discharged.

## 2023-10-11 NOTE — Inpatient Diabetes Management (Addendum)
 Inpatient Diabetes Program Recommendations  AACE/ADA: New Consensus Statement on Inpatient Glycemic Control (2015)  Target Ranges:  Prepandial:   less than 140 mg/dL      Peak postprandial:   less than 180 mg/dL (1-2 hours)      Critically ill patients:  140 - 180 mg/dL   Lab Results  Component Value Date   GLUCAP 401 (H) 10/11/2023   HGBA1C 8.3 05/07/2023    Review of Glycemic Control  Latest Reference Range & Units 10/10/23 07:55 10/10/23 12:43 10/11/23 08:40 10/11/23 12:20  Glucose-Capillary 70 - 99 mg/dL 70 409 (H) 811 (H) 914 (H)  Diabetes history: DM2 Outpatient Diabetes medications:  Medtr[employer] Insulin  Pump with Fiasp  Basal-1.20/hr--28.8 units/day Carb ratio 00:00-07:00-15 07:00-11:30-10 11:30-00:00-15 Target BG 90-110 Insulin  sensitivity factor--1 units brings him down 50 mg/dL Inpatient Diabetes Program Recommendations:    Note CBG is climbing despite insulin  pump boluses. Per patient he changed insulin  pump site this morning.  CBG's have continued to increase since site changed.  Per insulin  pump policy, patient should remove insulin  pump site and restart when blood sugars rise after boluses.  Assisted patient in removing insulin  pump site (no kink or abnormality seen).  Patient does not have more insulin  pump supplies at this time to change site and family is unavailable to bring at this time.  Consider checking BMP and order SQ insulin  regimen. HIs pump delivers 28.8 units/24 hours and he covers one unit for every 15 grams of CHO. Consider d/c of insulin  pump for now, Novolog  8 units x1, and add Semglee  25 units daily (starting now).  Also consider adding  Novolog  0-6 units q 4 hours plus meal coverage 3 units with meals.  Discussed with MD and RN.  Also explained to patient that long acting insulin  would be given today and he should not restart insulin  pump until tomorrow afternoon.   Thanks,  Josefa Ni, RN, BC-ADM Inpatient Diabetes Coordinator Pager [phone]   (8a-5p)

## 2023-10-11 NOTE — Telephone Encounter (Signed)
 Pharmacy Patient Advocate Encounter  Insurance verification completed.    The patient is insured through [facility].     Ran test claim for Dexcom G7 sensors, Freestyle Libre 3 Plus sensors and the current 30 day co-pay is $0.00.   This test claim was processed through [facility]- copay amounts [place] vary at other pharmacies due to pharmacy/plan contracts, or as the patient moves through the different stages of their insurance plan.

## 2023-10-11 NOTE — Consult Note (Signed)
 NAME:  Joel Sherrod Doring Sr., MRN:  [id number], DOB:  1945-12-30, LOS: 2 ADMISSION DATE:  10/08/2023, CONSULTATION DATE: 10/11/2023 REFERRING MD: [facility], CHIEF COMPLAINT: Loculated right pleural effusion in the setting of recent gallbladder surgery  History of Present Illness:  Joel Jones is a 78 year old male former smoker quit 50 years ago who recently underwent cholecystectomy and presented with right pleural effusion that was tapped x 2 and evaluated for possible fistula which was not there.  He also went for third thoracentesis with ultrasound not showing enough fluid to aspirate.  Fluid does appear to be loculated.  Pulmonary critical care asked to evaluate for possible interventions.  Of note cardiovascular thoracic surgery has been called also.  He appears to be improving at this time he is on room air in no acute distress.  Questionable if drain can be removed and followed up as an outpatient.  Pertinent  Medical History   Past Medical History:  Diagnosis Date   A-fib (HCC)    AKI (acute kidney injury) (HCC) 05/18/2021   Anemia    Arthritis    "all over"   Bradycardia    Chronic bronchitis (HCC)    "get it just about q yr" (03/17/2014)   Complication of anesthesia    difficulty waking up   Daily headache    "here lately" (03/17/2014); relates to sinuses   GERD (gastroesophageal reflux disease)    Hard of hearing    hearing aides bilat   Hiatal hernia    History of blood transfusion 2008   "related to OR"   History of kidney stones    Hyperlipidemia    Hypertension    Pneumonia 1972 X 1   Rheumatoid arthritis (HCC)    Type II diabetes mellitus (HCC)    Wears glasses      Significant Hospital Events: Including procedures, antibiotic start and stop dates in addition to other pertinent events     Interim History / Subjective:  78 year old male no acute distress  Objective    Blood pressure (!) 120/51, pulse 64, temperature 98.1 F (36.7 C), resp. rate 19, height 5\' 6"  (1.676  m), weight 81.6 kg, SpO2 98%.        Intake/Output Summary (Last 24 hours) at 10/11/2023 1234 Last data filed at 10/11/2023 0300 Gross per 24 hour  Intake 128.73 ml  Output 0 ml  Net 128.73 ml   Filed Weights   10/08/23 1249  Weight: 81.6 kg    Examination: General: Well-nourished well-developed male who is on room air in no acute distress. HENT: No JVD or lymphadenopathy is appreciated Lungs: In the bases Cardiovascular: Heart sounds are regular regular rate rhythm Abdomen: Soft nontender drain with scant drainage abdomen is noted Extremities: Left ankle with ankle brace for "twisted ankle but otherwise unremarkable Neuro: Grossly intact without focal defect GU: Voids  Resolved problem list   Assessment and Plan  Right loculated pleural effusion in the setting of recent gallbladder surgery with drain in place and last ultrasound revealing not sufficient fluid for thoracentesis appears loculated.  Pulmonary critical care is being consulted along with thoracic surgery for possible interventions.  Note that he is getting better does not appear to have a fistula from CT scans.  Possibly his drain could be removed from his abdomen.  He is in no respiratory distress whatsoever Pulmonary specialist to review CT scans Currently he is improving on current interventions Consideration for antibiotics drain removal and follow-up. CVTS consult has been called  Previous  history of cholecystectomy Diabetes Per primary  Best Practice (right click and "Reselect all SmartList Selections" daily)   Diet/type: Regular consistency (see orders) DVT prophylaxis not indicated Pressure ulcer(s): N/A GI prophylaxis: PPI Lines: N/A Foley:  N/A Code Status:  full code Last date of multidisciplinary goals of care discussion [tbd]  Labs   CBC: Recent Labs  Lab 10/05/23 0905 10/05/23 1933 10/08/23 1306 10/09/23 0422 10/10/23 0602  WBC 9.4 10.7* 12.9* 11.0* 8.6  NEUTROABS  --  8.4*  --   --    --   HGB 12.4* 12.3* 11.4* 10.5* 9.9*  HCT 35.7* 36.9* 32.9* 30.9* 29.4*  MCV 86.7 87.6 86.1 85.1 87.0  PLT 473* 450* 405* 389 408*    Basic Metabolic Panel: Recent Labs  Lab 10/05/23 1933 10/08/23 1306 10/09/23 0422 10/10/23 0602  NA 127* 131* 130* 129*  K 5.1 3.7 3.6 3.1*  CL 92* 95* 94* 95*  CO2 22 25 25 25   GLUCOSE 512* 113* 107* 86  BUN 16 8 10 9   CREATININE 1.22 0.97 0.93 0.84  CALCIUM 8.3* 8.3* 8.2* 7.7*  MG  --   --   --  1.9   GFR: Estimated Creatinine Clearance: 75 mL/min (by C-G formula based on SCr of 0.84 mg/dL). Recent Labs  Lab 10/05/23 1933 10/05/23 1937 10/05/23 2133 10/08/23 1306 10/09/23 0422 10/10/23 0602  WBC 10.7*  --   --  12.9* 11.0* 8.6  LATICACIDVEN  --  1.6 1.8  --   --   --     Liver Function Tests: Recent Labs  Lab 10/05/23 1933 10/09/23 0422 10/10/23 0602  AST 44* 22 18  ALT 32 19 17  ALKPHOS 94 93 79  BILITOT 0.8 0.5 0.6  PROT 6.2* 5.5* 5.2*  ALBUMIN 2.5* 1.9* 1.8*   Recent Labs  Lab 10/05/23 1933  LIPASE 25   No results for input(s): "AMMONIA" in the last 168 hours.  ABG    Component Value Date/Time   TCO2 31 09/21/2023 0739     Coagulation Profile: Recent Labs  Lab 10/05/23 0905  INR 1.2    Cardiac Enzymes: No results for input(s): "CKTOTAL", "CKMB", "CKMBINDEX", "TROPONINI" in the last 168 hours.  HbA1C: Hemoglobin A1C  Date/Time Value Ref Range Status  05/07/2023 12:00 AM 8.3  Final  08/02/2021 12:00 AM 7.2  Final   HbA1c, POC (controlled diabetic range)  Date/Time Value Ref Range Status  06/27/2022 02:50 PM 8.1 (A) 0.0 - 7.0 % Final    CBG: Recent Labs  Lab 10/10/23 0257 10/10/23 0755 10/10/23 1243 10/11/23 0840 10/11/23 1220  GLUCAP 154* 70 113* 300* 401*    Review of Systems:   10 point review of system taken, please see HPI for positives and negatives.   Past Medical History:  He,  has a past medical history of A-fib (HCC), AKI (acute kidney injury) (HCC) (05/18/2021), Anemia,  Arthritis, Bradycardia, Chronic bronchitis (HCC), Complication of anesthesia, Daily headache, GERD (gastroesophageal reflux disease), Hard of hearing, Hiatal hernia, History of blood transfusion (2008), History of kidney stones, Hyperlipidemia, Hypertension, Pneumonia (1972 X 1), Rheumatoid arthritis (HCC), Type II diabetes mellitus (HCC), and Wears glasses.   Surgical History:   Past Surgical History:  Procedure Laterality Date   ATRIAL FIBRILLATION ABLATION N/A 05/23/2023   Procedure: ATRIAL FIBRILLATION ABLATION;  Surgeon: [name], MD;  Location: [facility];  Service: Cardiovascular;  Laterality: N/A;   broken finger      surgical repaired left hand 2nd finger  CARDIOVERSION N/A 11/08/2022   Procedure: CARDIOVERSION;  Surgeon: [name], MD;  Location: [facility];  Service: Cardiovascular;  Laterality: N/A;   cyst removed      per left knee/posteriorly   CYSTECTOMY Left 2009   "behind knee"   INGUINAL HERNIA REPAIR Right ?2010   IR CV LINE INJECTION  10/02/2023   IR CV LINE INJECTION  10/10/2023   IR THORACENTESIS ASP PLEURAL SPACE W/IMG GUIDE  10/03/2023   JOINT REPLACEMENT     KNEE ARTHROSCOPY Bilateral 1980's   LIVER BIOPSY N/A 09/05/2023   Procedure: BIOPSY, LIVER;  Surgeon: [name], MD;  Location: [facility];  Service: General;  Laterality: N/A;   PARTIAL KNEE ARTHROPLASTY Right 08/27/2015   Procedure: RIGHT UNICOMPARTMENTAL KNEE ARTHROPLASTY;  Surgeon: [name], MD;  Location: [facility];  Service: Orthopedics;  Laterality: Right;   REVISION TOTAL KNEE ARTHROPLASTY Left 2008   SHOULDER SURGERY Left 2019   TOTAL KNEE ARTHROPLASTY Left 2003     Social History:   reports that he quit smoking about 55 years ago. His smoking use included cigarettes and cigars. He started smoking about 60 years ago. He has a 5 pack-year smoking history. He has never been exposed to tobacco smoke. He has never used smokeless tobacco. He reports that he does  not drink alcohol and does not use drugs.   Family History:  His family history includes Cancer in his father.   Allergies Allergies  Allergen Reactions   Codeine Swelling   Lipitor [Atorvastatin] Other (See Comments)    Muscle aches    Invokana [Canagliflozin] Other (See Comments)    Stomach upset    Losartan Nausea And Vomiting   Roxicodone  [Oxycodone ] Itching   Vicodin [Hydrocodone -Acetaminophen ] Itching     Home Medications  Prior to Admission medications   Medication Sig Start Date End Date Taking? Authorizing Provider  albuterol  (PROVENTIL ) (2.5 MG/3ML) 0.083% nebulizer solution Take 3 mLs (2.5 mg total) by nebulization every 6 (six) hours as needed for wheezing or shortness of breath. 10/02/23  Yes [name], MD  amLODipine  (NORVASC ) 5 MG tablet Take 1 tablet (5 mg total) by mouth daily. Patient taking differently: Take 5 mg by mouth in the morning. 09/04/23  Yes [name], MD  amoxicillin -clavulanate (AUGMENTIN ) 875-125 MG tablet Take 1 tablet by mouth 2 (two) times daily.   Yes [provider]  apixaban  (ELIQUIS ) 5 MG TABS tablet Take 1 tablet (5 mg total) by mouth 2 (two) times daily. 07/16/23  Yes Lenise Quince, MD  ascorbic acid  (VITAMIN C ) 500 MG tablet Take 500 mg by mouth in the morning.   Yes [provider]  calcium carbonate (OSCAL) 1500 (600 Ca) MG TABS tablet Take 600 mg of elemental calcium by mouth in the morning.   Yes [provider]  calcium carbonate (TUMS - DOSED IN MG ELEMENTAL CALCIUM) 500 MG chewable tablet Chew 1 tablet by mouth as needed for indigestion or heartburn.   Yes [provider]  cholecalciferol (VITAMIN D3) 25 MCG (1000 UNIT) tablet Take 1,000 Units by mouth in the morning.   Yes [provider]  Insulin  Aspart, w/Niacinamide, (FIASP ) 100 UNIT/ML SOLN 77 Units continuous. Insulin  pump 09/27/22  Yes [provider]  insulin  glargine (LANTUS ) 100 UNIT/ML injection Inject 16 Units  into the skin as needed (If pump goes out).   Yes [provider]  KRILL OIL PO Take 400 mg by mouth in the morning.  Yes [provider]  loratadine  (CLARITIN ) 10 MG tablet Take 10 mg by mouth in the morning.   Yes [provider]  methocarbamol  (ROBAXIN ) 500 MG tablet Take 1 tablet (500 mg total) by mouth every 12 (twelve) hours as needed for muscle spasms. Patient taking differently: Take 500 mg by mouth every 6 (six) hours as needed for muscle spasms. 09/28/23  Yes [name], MD  Multiple Vitamin (MULTIVITAMIN WITH MINERALS) TABS Take 1 tablet by mouth in the morning. Centrum   Yes [provider]  ondansetron  (ZOFRAN -ODT) 4 MG disintegrating tablet Take 1 tablet (4 mg total) by mouth every 8 (eight) hours as needed for nausea or vomiting. 09/09/23  Yes Ninetta Basket, MD  pantoprazole  (PROTONIX ) 40 MG tablet TAKE 1 TABLET BY MOUTH DAILY Patient taking differently: Take 40 mg by mouth in the morning. 06/27/23  Yes Mealor, Augustus E, MD  Potassium 99 MG TABS Take 99 mg by mouth in the morning.   Yes [provider]  pyridOXINE (VITAMIN B6) 50 MG tablet Take 50 mg by mouth in the morning.   Yes [provider]  ramipril  (ALTACE ) 5 MG capsule Take 1 capsule (5 mg total) by mouth daily. Patient taking differently: Take 5 mg by mouth in the morning. 09/04/23  Yes [name], MD  traMADol  (ULTRAM ) 50 MG tablet Take 1 tablet (50 mg total) by mouth every 6 (six) hours as needed for moderate pain (pain score 4-6). 09/25/23  Yes [place], Eric J, DO  vitamin E  400 UNIT capsule Take 400 Units by mouth in the morning.   Yes [provider]  zinc gluconate 50 MG tablet Take 50 mg by mouth in the morning.   Yes [provider]  Abatacept  (ORENCIA  IV) Inject 750 mg into the vein every 28 (twenty-eight) days. Patient not taking: Reported on 10/08/2023    [provider]  albuterol  (VENTOLIN  HFA) 108 (90 Base) MCG/ACT  inhaler Inhale 2 puffs into the lungs every 6 (six) hours as needed for wheezing or shortness of breath. Patient not taking: Reported on 10/08/2023 10/01/23   [name], MD  ONE TOUCH ULTRA TEST test strip 1 each daily. Pt says 2-3 times daily 05/26/16   [provider]  pravastatin  (PRAVACHOL ) 40 MG tablet Take 40 mg by mouth in the morning. Patient not taking: Reported on 10/08/2023    [provider]     Critical care time: Bonnee Bustle Koda Defrank ACNP Acute Care Nurse Practitioner Jonny Neu Pulmonary/Critical Care Please consult Amion 10/11/2023, 12:35 PM

## 2023-10-12 ENCOUNTER — Other Ambulatory Visit (HOSPITAL_COMMUNITY): Payer: Self-pay

## 2023-10-12 DIAGNOSIS — J90 Pleural effusion, not elsewhere classified: Secondary | ICD-10-CM | POA: Diagnosis not present

## 2023-10-12 LAB — GLUCOSE, CAPILLARY
Glucose-Capillary: 170 mg/dL — ABNORMAL HIGH (ref 70–99)
Glucose-Capillary: 89 mg/dL (ref 70–99)
Glucose-Capillary: 220 mg/dL — ABNORMAL HIGH (ref 70–99)
Glucose-Capillary: 238 mg/dL — ABNORMAL HIGH (ref 70–99)
Glucose-Capillary: 264 mg/dL — ABNORMAL HIGH (ref 70–99)
Glucose-Capillary: 160 mg/dL — ABNORMAL HIGH (ref 70–99)
Glucose-Capillary: 182 mg/dL — ABNORMAL HIGH (ref 70–99)

## 2023-10-12 MED ORDER — SODIUM CHLORIDE FLUSH 0.9 % IV SOLN
INTRAVENOUS | 1 refills | Status: DC
  Filled 2023-10-12: qty 300, 30d supply, fill #0

## 2023-10-12 NOTE — H&P (View-Only) (Signed)
 [address]       [place] [address]             [phone]        [facility] Medical Record #[id number] Date of Birth: 02-26-1946  Referring: Joel [facility] Care: Joel Sport, MD Primary Cardiologist:Joel Audery Blazing, MD  Chief Complaint:    Chief Complaint  Patient presents with   Shortness of Breath   History of Present Illness:      Joel Jones is a 78 yo male with known history of H/O nicotine abuse (quit 50 years ago), HTN, HLD, DM Type 2, GERD, PAF, CAD, RA, and recurrent pleural effusion.  He underwent Robotic Choleystectomy on 09/05/2023.  He was recently admitted earlier this month after presenting with complaints of abdominal pain, nausea and vomiting.  He was noted at that time to have evidence of a fluid collection with air fluid levels in the gallbladder fossa consistent with abscess or biloma.  He was also noted to have findings of gastric outlet obstruction.  He underwent a hepatobiliary leak scan which showed no evidence of bile leak and a patent common bile duct.  IR was consulted and he underwent placement of a drain on 09/22/2023.  Fluid was sent for culture and was positive for Enterobacter Cloacae, Enterococcus faecium.  He was treated with IV Zosyn , diet advanced as tolerated and he was discharged home on oral ABX.  He developed right sided pleural effusion which required thoracentesis on 5/21 with removal of 1000 cc of dark red cloudy fluid.  He again had thoracentesis with 500 cc removal on 5/23.  He again presented to the ED on 10/05/2023 with complaints of decreased drainage from his abdominal drain.  Of note earlier in the day the patient had underwent new drain placement.  IR was consulted and recommended flushing drain which was done without difficulty.  Repeat CT scan at that time showed a loculated right sided pleura leffusion and question of a new developing fluid collection with associated gas.  He was  discharged from the ED with recommended outpatient follow up.  The patient again presented to the ED on 5/26 with complaints of difficulty breathing and right sided chest pain.  He denied back and abdominal pain.  He denied fevers, chills, N/V.  IR was again consulted.  They performed ultrasound for Thoracentesis and did not feel there was enough fluid for safe procedure.   Repeat CT scan of the CAP was obtained and IR reviewed and did not feel chest tube was recommended due to small pleural effusion with pleural thickening and they recommended consultation for possible VATS procedure.  IR did reposition/replace previously biliary drain with removal of 200 cc bilious drainage.  Currently the patient feels much better.  He is no longer short of breath.  He ideally does not want surgery.  He states he has been through a lot recently and would prefer to avoid any further procedures.  He has completed his course of antibiotics.  Past Medical History:  Diagnosis Date   A-fib [facility])    AKI (acute kidney injury) (HCC) 05/18/2021   Anemia    Arthritis    "all over"   Bradycardia    Chronic bronchitis (HCC)    "get it just about q yr" (03/17/2014)   Complication of anesthesia    difficulty waking up   Daily headache    "here lately" (03/17/2014); relates to sinuses  GERD (gastroesophageal reflux disease)    Hard of hearing    hearing aides bilat   Hiatal hernia    History of blood transfusion 2008   "related to OR"   History of kidney stones    Hyperlipidemia    Hypertension    Pneumonia 1972 X 1   Rheumatoid arthritis (HCC)    Type II diabetes mellitus (HCC)    Wears glasses    Past Surgical History:  Procedure Laterality Date   ATRIAL FIBRILLATION ABLATION N/A 05/23/2023   Procedure: ATRIAL FIBRILLATION ABLATION;  Surgeon: Joel Grange, MD;  Location: [facility] INV[facility];  Service: Cardiovascular;  Laterality: N/A;   broken finger      surgical repaired left hand 2nd finger     CARDIOVERSION N/A 11/08/2022   Procedure: CARDIOVERSION;  Surgeon: Joel Campus, MD;  Location: [facility] INVASIVE CV [facility];  Service: Cardiovascular;  Laterality: N/A;   cyst removed      per left knee/posteriorly   CYSTECTOMY Left 2009   "behind knee"   INGUINAL HERNIA REPAIR Right ?2010   IR CV LINE INJECTION  10/02/2023   IR CV LINE INJECTION  10/10/2023   IR THORACENTESIS ASP PLEURAL SPACE W/IMG GUIDE  10/03/2023   JOINT REPLACEMENT     KNEE ARTHROSCOPY Bilateral 1980's   LIVER BIOPSY N/A 09/05/2023   Procedure: BIOPSY, LIVER;  Surgeon: Joel Allegra, MD;  Location: [facility];  Service: General;  Laterality: N/A;   PARTIAL KNEE ARTHROPLASTY Right 08/27/2015   Procedure: RIGHT UNICOMPARTMENTAL KNEE ARTHROPLASTY;  Surgeon: Joel Lao, MD;  Location: [facility] OR[facility];  Service: Orthopedics;  Laterality: Right;   REVISION TOTAL KNEE ARTHROPLASTY Left 2008   SHOULDER SURGERY Left 2019   TOTAL KNEE ARTHROPLASTY Left 2003    Social History   Tobacco Use  Smoking Status Former   Current packs/day: 0.00   Average packs/day: 1 pack/day for 5.0 years (5.0 ttl pk-yrs)   Types: Cigarettes, Cigars   Start date: 05/16/1963   Quit date: 05/15/1968   Years since quitting: 55.4   Passive exposure: Never  Smokeless Tobacco Never  Tobacco Comments   Former smoker 06/20/23    Social History   Substance and Sexual Activity  Alcohol Use No   Alcohol/week: 0.0 standard drinks of alcohol    Allergies  Allergen Reactions   Codeine Swelling   Lipitor [Atorvastatin] Other (See Comments)    Muscle aches    Invokana [Canagliflozin] Other (See Comments)    Stomach upset    Losartan Nausea And Vomiting   Roxicodone  [Oxycodone ] Itching   Vicodin [Hydrocodone -Acetaminophen ] Itching    Current Facility-Administered Medications  Medication Dose Route Frequency Provider Last Rate Last Admin   acetaminophen  (TYLENOL ) tablet 650 mg  650 mg Oral Q6H PRN Melvin, Alexander B, MD       Or   acetaminophen   (TYLENOL ) suppository 650 mg  650 mg Rectal Q6H PRN Joel Nab, MD       albuterol  (PROVENTIL ) (2.5 MG/3ML) 0.083% nebulizer solution 3 mL  3 mL Inhalation Q6H PRN Joel Nab, MD       amLODipine  (NORVASC ) tablet 5 mg  5 mg Oral Daily Melvin, Alexander B, MD   5 mg at 10/12/23 9604   atropine  1 MG/10ML injection 0.5 mg  0.5 mg Intravenous PRN Sundil, Subrina, MD       dextrose  50 % solution 50 mL  50 mL Intravenous PRN Sundil, Subrina, MD   50 mL at 10/10/23 0751  feeding supplement (ENSURE ENLIVE / ENSURE PLUS) liquid 237 mL  237 mL Oral BID BM Melvin, Alexander B, MD   237 mL at 10/11/23 1440   fentaNYL  (SUBLIMAZE ) injection 25 mcg  25 mcg Intravenous BID PRN Joel Nab, MD   25 mcg at 10/09/23 1222   insulin  aspart (novoLOG ) injection 0-6 Units  0-6 Units Subcutaneous Q4H Pokhrel, Laxman, MD   2 Units at 10/12/23 0919   insulin  aspart (novoLOG ) injection 3 Units  3 Units Subcutaneous TID WC Pokhrel, Laxman, MD       insulin  glargine-yfgn (SEMGLEE ) injection 28 Units  28 Units Subcutaneous Daily Pokhrel, Laxman, MD   28 Units at 10/12/23 0925   insulin  pump   Subcutaneous TID WC, HS, 0200 Melvin, Alexander B, MD   Given at 10/11/23 2049   ondansetron  (ZOFRAN ) injection 4 mg  4 mg Intravenous Q6H PRN Pokhrel, Laxman, MD   4 mg at 10/11/23 1613   pantoprazole  (PROTONIX ) EC tablet 40 mg  40 mg Oral Daily Melvin, Alexander B, MD   40 mg at 10/12/23 4696   polyethylene glycol (MIRALAX  / GLYCOLAX ) packet 17 g  17 g Oral Daily PRN Melvin, Alexander B, MD       pravastatin  (PRAVACHOL ) tablet 40 mg  40 mg Oral Daily Melvin, Alexander B, MD   40 mg at 10/12/23 2952   ramipril  (ALTACE ) capsule 5 mg  5 mg Oral Daily Melvin, Alexander B, MD   5 mg at 10/12/23 8413   sodium chloride  flush (NS) 0.9 % injection 3 mL  3 mL Intravenous Q12H Joel Nab, MD   3 mL at 10/12/23 2440   traMADol  (ULTRAM ) tablet 50 mg  50 mg Oral Q6H PRN Sundil, Subrina, MD   50 mg at 10/12/23 1027     Medications Prior to Admission  Medication Sig Dispense Refill Last Dose/Taking   albuterol  (PROVENTIL ) (2.5 MG/3ML) 0.083% nebulizer solution Take 3 mLs (2.5 mg total) by nebulization every 6 (six) hours as needed for wheezing or shortness of breath. 360 mL 1 Unknown   amLODipine  (NORVASC ) 5 MG tablet Take 1 tablet (5 mg total) by mouth daily. (Patient taking differently: Take 5 mg by mouth in the morning.) 90 tablet 3 10/07/2023   amoxicillin -clavulanate (AUGMENTIN ) 875-125 MG tablet Take 1 tablet by mouth 2 (two) times daily.   10/07/2023   apixaban  (ELIQUIS ) 5 MG TABS tablet Take 1 tablet (5 mg total) by mouth 2 (two) times daily. 60 tablet 6 10/07/2023 at  6:00 PM   ascorbic acid  (VITAMIN C ) 500 MG tablet Take 500 mg by mouth in the morning.   10/07/2023   calcium carbonate (OSCAL) 1500 (600 Ca) MG TABS tablet Take 600 mg of elemental calcium by mouth in the morning.   10/07/2023   calcium carbonate (TUMS - DOSED IN MG ELEMENTAL CALCIUM) 500 MG chewable tablet Chew 1 tablet by mouth as needed for indigestion or heartburn.   Past Week   cholecalciferol (VITAMIN D3) 25 MCG (1000 UNIT) tablet Take 1,000 Units by mouth in the morning.   10/07/2023   Insulin  Aspart, w/Niacinamide, (FIASP ) 100 UNIT/ML SOLN 77 Units continuous. Insulin  pump   Unknown   insulin  glargine (LANTUS ) 100 UNIT/ML injection Inject 16 Units into the skin as needed (If pump goes out).   Unknown   KRILL OIL PO Take 400 mg by mouth in the morning.   Taking   loratadine  (CLARITIN ) 10 MG tablet Take 10 mg by mouth in the morning.  10/07/2023   methocarbamol  (ROBAXIN ) 500 MG tablet Take 1 tablet (500 mg total) by mouth every 12 (twelve) hours as needed for muscle spasms. (Patient taking differently: Take 500 mg by mouth every 6 (six) hours as needed for muscle spasms.) 20 tablet 1 Taking Differently   Multiple Vitamin (MULTIVITAMIN WITH MINERALS) TABS Take 1 tablet by mouth in the morning. Centrum   10/07/2023   ondansetron   (ZOFRAN -ODT) 4 MG disintegrating tablet Take 1 tablet (4 mg total) by mouth every 8 (eight) hours as needed for nausea or vomiting. 12 tablet 0 Taking As Needed   pantoprazole  (PROTONIX ) 40 MG tablet TAKE 1 TABLET BY MOUTH DAILY (Patient taking differently: Take 40 mg by mouth in the morning.) 90 tablet 3 10/07/2023   Potassium 99 MG TABS Take 99 mg by mouth in the morning.   10/07/2023   pyridOXINE (VITAMIN B6) 50 MG tablet Take 50 mg by mouth in the morning.   10/07/2023   ramipril  (ALTACE ) 5 MG capsule Take 1 capsule (5 mg total) by mouth daily. (Patient taking differently: Take 5 mg by mouth in the morning.) 90 capsule 3 10/07/2023   traMADol  (ULTRAM ) 50 MG tablet Take 1 tablet (50 mg total) by mouth every 6 (six) hours as needed for moderate pain (pain score 4-6). 30 tablet 0 Taking As Needed   vitamin E  400 UNIT capsule Take 400 Units by mouth in the morning.   10/07/2023   zinc gluconate 50 MG tablet Take 50 mg by mouth in the morning.   10/07/2023   Abatacept  (ORENCIA  IV) Inject 750 mg into the vein every 28 (twenty-eight) days. (Patient not taking: Reported on 10/08/2023)   Not Taking   albuterol  (VENTOLIN  HFA) 108 (90 Base) MCG/ACT inhaler Inhale 2 puffs into the lungs every 6 (six) hours as needed for wheezing or shortness of breath. (Patient not taking: Reported on 10/08/2023) 18 g 1 Not Taking   [EXPIRED] HYDROmorphone  (DILAUDID ) 2 MG tablet Take 0.5 tablets (1 mg total) by mouth every 6 (six) hours as needed for up to 3 days for severe pain (pain score 7-10). (Patient not taking: Reported on 10/08/2023) 6 tablet 0 Not Taking   ONE TOUCH ULTRA TEST test strip 1 each daily. Pt says 2-3 times daily  3    pravastatin  (PRAVACHOL ) 40 MG tablet Take 40 mg by mouth in the morning. (Patient not taking: Reported on 10/08/2023)   Not Taking    Family History  Problem Relation Age of Onset   Cancer Father        prostate   Review of Systems:     Cardiac Review of Systems: Y or  [    ]= no  Chest Pain  [ Y, resolved   ]  Resting SOB [ Y, resolved  ] Exertional SOB  [  ]  Orthopnea [  ]   Pedal Edema [   ]    Palpitations [  ] Syncope  [  ]   Presyncope [   ]  General Review of Systems: [Y] = yes [  ]=no Constitional: recent weight change [  ]; anorexia [  ]; fatigue [  ]; nausea Discordia.Diesel  ]; night sweats [  ]; fever [  ]; or chills [  ]  Dental: Last Dentist visit:   Eye : blurred vision [  ]; diplopia [   ]; vision changes [  ];  Amaurosis fugax[  ]; Resp: cough [ N ];  wheezing[  ];  hemoptysis[  ]; shortness of breath[ Y, resolved ]; paroxysmal nocturnal dyspnea[  ]; dyspnea on exertion[  ]; or orthopnea[  ];  GI:  gallstones[  ], vomiting[ N ];  dysphagia[  ]; melena[  ];  hematochezia [  ]; heartburn[  ];   Hx of  Colonoscopy[  ];  Biliary drain in place GU: kidney stones [  ]; hematuria[  ];   dysuria [  ];  nocturia[  ];  history of     obstruction [  ]; urinary frequency [  ]             Skin: rash, swelling[  ];, hair loss[  ];  peripheral edema[  ];  or itching[  ]; Musculosketetal: myalgias[  ];  joint swelling[  ];  joint erythema[  ];  joint pain[  ];  back pain[  ];  Heme/Lymph: bruising[  ];  bleeding[  ];  anemia[  ];  Neuro: TIA[  ];  headaches[  ];  stroke[  ];  vertigo[  ];  seizures[  ];   paresthesias[  ];  difficulty walking[  ];  Psych:depression[  ]; anxiety[  ];  Endocrine: diabetes[ Y ];  thyroid  dysfunction[  ];              Physical Exam: BP (!) 112/53 (BP Location: Left Arm)   Pulse (!) 50   Temp 97.6 F (36.4 C)   Resp 18   Ht 5\' 6"  (1.676 m)   Wt 81.6 kg   SpO2 100%   BMI 29.05 kg/m   General appearance: alert, cooperative, and no distress Head: Normocephalic, without obvious abnormality, atraumatic Resp: diminished right base Cardio: regular rate and rhythm GI: soft, non-tender; bowel sounds normal; no masses,  no organomegaly and trender along RUQ Biliary drain Extremities: extremities normal,  atraumatic, no cyanosis or edema Neurologic: Grossly normal  Diagnostic Studies & Laboratory data:     Recent Radiology Findings:   IR INJECT INDWELLING DRAINAGE CATHETER Result Date: 10/10/2023 CLINICAL DATA:  Prior cholecystectomy, subsequent gallbladder fossa collection, post percutaneous drain catheter placement 09/22/2023. EXAM: SINUS TRACT INJECTION/FISTULOGRAM ANESTHESIA/SEDATION: None required MEDICATIONS: No periprocedural antibiotics were indicated CONTRAST:  5mL OMNIPAQUE  IOHEXOL  300 MG/ML  SOLN PROCEDURE: The procedure, risks (including but not limited to bleeding, infection, organ damage ), benefits, and alternatives were explained to the patient. Questions regarding the procedure were encouraged and answered. The patient understands and consents to the procedure. Under fluoroscopy, contrast was gently injected into the right upper quadrant drain catheter. COMPLICATIONS: None immediate FINDINGS: Initially, contrast tracked back along the course of the catheter exiting at the skin entry site. With slightly more vigorous injection, filling of a collapsed cavity was seen in the gallbladder fossa. No pleural fistula noted. IMPRESSION: 1. No fistula identified. 2. Resolution of subhepatic collection. Electronically Signed   By: Nicoletta Barrier M.D.   On: 10/10/2023 16:16     I have independently reviewed the above radiologic studies and discussed with the patient   Recent Lab Findings: Lab Results  Component Value Date   WBC 8.6 10/10/2023   HGB 9.9 (L) 10/10/2023   HCT 29.4 (L) 10/10/2023   PLT 408 (H) 10/10/2023   GLUCOSE 363 (H) 10/11/2023   CHOL 120 07/03/2023  TRIG 103 07/03/2023   HDL 34 (L) 07/03/2023   LDLCALC 65 07/03/2023   ALT 17 10/10/2023   AST 18 10/10/2023   NA 130 (L) 10/11/2023   K 3.6 10/11/2023   CL 95 (L) 10/11/2023   CREATININE 1.00 10/11/2023   BUN 11 10/11/2023   CO2 24 10/11/2023   TSH 3.03 05/24/2016   INR 1.2 10/05/2023   HGBA1C 8.3 05/07/2023    Assessment / Plan:      Recurrent right pleural effusion, now minimal fluid post thoracentesis however there is near complete collapse of RLL.... developed post Robotic Cholecystectomy performed by Dr. [name] on 4/23 Gallbladder Fossa Abscess- previous cultures grew Enterococcus and Enterobacter he has completed ABX course...biliary level pending from most recent Thoracentesis... IR drain was outside collection, this was replaced on 5/29 with removal of 200 cc bilious drainage and patient feels better since placement   Overall patient is stable.  His respiratory symptoms have resolved.  There is minimal fluid to tap or safely place chest tube per IR.. We have been consulted for possible VATS procedure.  The patient would ideally prefer to not have any further interventions at this time.  Dr. Luna Salinas will evaluate and make recommendations... Of note the daughter questions if repeat HIDA Scan would be indicated and we will defer to primary team/IR in this matter.   I  spent 40 minutes counseling the patient face to face.   Gates Kasal, PA-C 10/12/2023 10:00 AM  I have seen and examined Joel Jones and reviewed his chest x-ray and CT images.  Underwent a robotic cholecystectomy in April.  Complicated by bile leak/abscess.  Reactive pleural effusion.  Has had thoracentesis x 2.  Recent CT showed a moderate effusion with complete atelectasis of the right lower lobe.  IR unable to place a drain.  Probably not an ideal situation for thrombolytics with multiple previous percutaneous interventions to his gallbladder fossa.  I think his best option would be to do a right VATS to drain pleural effusion and decorticate the right lower lobe.  I described the postoperative procedure to him.  His daughter-in-law was also present.  I informed him of the general nature of the procedure including the need for general anesthesia, the incisions to be used, the use of drains to postoperatively, the goals of care,  expected hospital stay, and the overall recovery.  I informed him of the indications, risks, benefits, and alternatives.  He understands the risks include, but not limited to death, MI, DVT, PE, bleeding, possible need for transfusion, infection, air leaks, cardiac arrhythmias, as well as possibility of other unforeseeable complications.  He is understandably reluctant to undergo another procedure.  He does not want to proceed at this time.  I did explain that this is likely to only get worse over time and is very unlikely to resolve on its own.  I will check with him again tomorrow.  Hopefully he will decide to proceed.  Milon Aloe Luna Salinas, MD [facility] [phone]

## 2023-10-12 NOTE — Plan of Care (Signed)

## 2023-10-12 NOTE — Consult Note (Addendum)
 [address]       [place] [address]             [phone]        [facility] Medical Record #[id number] Date of Birth: 04-16-1946  Referring: [facility] Primary Care: [name], Joel Jones Primary Cardiologist:Brian Audery Blazing, Joel Jones  Chief Complaint:    Chief Complaint  Patient presents with   Shortness of Breath   History of Present Illness:      Joel Jones is a 78 yo male with known history of H/O nicotine abuse (quit 50 years ago), HTN, HLD, DM Type 2, GERD, PAF, CAD, RA, and recurrent pleural effusion.  He underwent Robotic Choleystectomy on 09/05/2023.  He was recently admitted earlier this month after presenting with complaints of abdominal pain, nausea and vomiting.  He was noted at that time to have evidence of a fluid collection with air fluid levels in the gallbladder fossa consistent with abscess or biloma.  He was also noted to have findings of gastric outlet obstruction.  He underwent a hepatobiliary leak scan which showed no evidence of bile leak and a patent common bile duct.  IR was consulted and he underwent placement of a drain on 09/22/2023.  Fluid was sent for culture and was positive for Enterobacter Cloacae, Enterococcus faecium.  He was treated with IV Zosyn , diet advanced as tolerated and he was discharged home on oral ABX.  He developed right sided pleural effusion which required thoracentesis on 5/21 with removal of 1000 cc of dark red cloudy fluid.  He again had thoracentesis with 500 cc removal on 5/23.  He again presented to the ED on 10/05/2023 with complaints of decreased drainage from his abdominal drain.  Of note earlier in the day the patient had underwent new drain placement.  IR was consulted and recommended flushing drain which was done without difficulty.  Repeat CT scan at that time showed a loculated right sided pleura leffusion and question of a new developing fluid collection with associated gas.  He was  discharged from the ED with recommended outpatient follow up.  The patient again presented to the ED on 5/26 with complaints of difficulty breathing and right sided chest pain.  He denied back and abdominal pain.  He denied fevers, chills, N/V.  IR was again consulted.  They performed ultrasound for Thoracentesis and did not feel there was enough fluid for safe procedure.   Repeat CT scan of the CAP was obtained and IR reviewed and did not feel chest tube was recommended due to small pleural effusion with pleural thickening and they recommended consultation for possible VATS procedure.  IR did reposition/replace previously biliary drain with removal of 200 cc bilious drainage.  Currently the patient feels much better.  He is no longer short of breath.  He ideally does not want surgery.  He states he has been through a lot recently and would prefer to avoid any further procedures.  He has completed his course of antibiotics.  Past Medical History:  Diagnosis Date   A-fib [facility])    AKI (acute kidney injury) (HCC) 05/18/2021   Anemia    Arthritis    "all over"   Bradycardia    Chronic bronchitis (HCC)    "get it just about q yr" (03/17/2014)   Complication of anesthesia    difficulty waking up   Daily headache    "here lately" (03/17/2014); relates to sinuses  GERD (gastroesophageal reflux disease)    Hard of hearing    hearing aides bilat   Hiatal hernia    History of blood transfusion 2008   "related to OR"   History of kidney stones    Hyperlipidemia    Hypertension    Pneumonia 1972 X 1   Rheumatoid arthritis (HCC)    Type II diabetes mellitus (HCC)    Wears glasses    Past Surgical History:  Procedure Laterality Date   ATRIAL FIBRILLATION ABLATION N/A 05/23/2023   Procedure: ATRIAL FIBRILLATION ABLATION;  Surgeon: [name], Joel Jones;  Location: [facility] INV[facility];  Service: Cardiovascular;  Laterality: N/A;   broken finger      surgical repaired left hand 2nd finger     CARDIOVERSION N/A 11/08/2022   Procedure: CARDIOVERSION;  Surgeon: [name], Joel Jones;  Location: [facility] INV[facility] CV [facility];  Service: Cardiovascular;  Laterality: N/A;   cyst removed      per left knee/posteriorly   CYSTECTOMY Left 2009   "behind knee"   INGUINAL HERNIA REPAIR Right ?2010   IR CV LINE INJECTION  10/02/2023   IR CV LINE INJECTION  10/10/2023   IR THORACENTESIS ASP PLEURAL SPACE W/IMG GUIDE  10/03/2023   JOINT REPLACEMENT     KNEE ARTHROSCOPY Bilateral 1980's   LIVER BIOPSY N/A 09/05/2023   Procedure: BIOPSY, LIVER;  Surgeon: [name], Joel Jones;  Location: [facility];  Service: General;  Laterality: N/A;   PARTIAL KNEE ARTHROPLASTY Right 08/27/2015   Procedure: RIGHT UNICOMPARTMENTAL KNEE ARTHROPLASTY;  Surgeon: [name], Joel Jones;  Location: [facility];  Service: Orthopedics;  Laterality: Right;   REVISION TOTAL KNEE ARTHROPLASTY Left 2008   SHOULDER SURGERY Left 2019   TOTAL KNEE ARTHROPLASTY Left 2003    Social History   Tobacco Use  Smoking Status Former   Current packs/day: 0.00   Average packs/day: 1 pack/day for 5.0 years (5.0 ttl pk-yrs)   Types: Cigarettes, Cigars   Start date: 05/16/1963   Quit date: 05/15/1968   Years since quitting: 55.4   Passive exposure: Never  Smokeless Tobacco Never  Tobacco Comments   Former smoker 06/20/23    Social History   Substance and Sexual Activity  Alcohol Use No   Alcohol/week: 0.0 standard drinks of alcohol    Allergies  Allergen Reactions   Codeine Swelling   Lipitor [Atorvastatin] Other (See Comments)    Muscle aches    Invokana [Canagliflozin] Other (See Comments)    Stomach upset    Losartan Nausea And Vomiting   Roxicodone  [Oxycodone ] Itching   Vicodin [Hydrocodone -Acetaminophen ] Itching    Current Facility-Administered Medications  Medication Dose Route Frequency Provider Last Rate Last Admin   acetaminophen  (TYLENOL ) tablet 650 mg  650 mg Oral Q6H PRN Melvin, Alexander B, Joel Jones       Or   acetaminophen   (TYLENOL ) suppository 650 mg  650 mg Rectal Q6H PRN Johnetta Nab, Joel Jones       albuterol  (PROVENTIL ) (2.5 MG/3ML) 0.083% nebulizer solution 3 mL  3 mL Inhalation Q6H PRN Johnetta Nab, Joel Jones       amLODipine  (NORVASC ) tablet 5 mg  5 mg Oral Daily Melvin, Alexander B, Joel Jones   5 mg at 10/12/23 9604   atropine  1 MG/10ML injection 0.5 mg  0.5 mg Intravenous PRN Sundil, Subrina, Joel Jones       dextrose  50 % solution 50 mL  50 mL Intravenous PRN Sundil, Subrina, Joel Jones   50 mL at 10/10/23 0751  feeding supplement (ENSURE ENLIVE / ENSURE PLUS) liquid 237 mL  237 mL Oral BID BM Melvin, Alexander B, Joel Jones   237 mL at 10/11/23 1440   fentaNYL  (SUBLIMAZE ) injection 25 mcg  25 mcg Intravenous BID PRN Johnetta Nab, Joel Jones   25 mcg at 10/09/23 1222   insulin  aspart (novoLOG ) injection 0-6 Units  0-6 Units Subcutaneous Q4H Pokhrel, Laxman, Joel Jones   2 Units at 10/12/23 0919   insulin  aspart (novoLOG ) injection 3 Units  3 Units Subcutaneous TID WC Pokhrel, Laxman, Joel Jones       insulin  glargine-yfgn (SEMGLEE ) injection 28 Units  28 Units Subcutaneous Daily Pokhrel, Laxman, Joel Jones   28 Units at 10/12/23 0925   insulin  pump   Subcutaneous TID WC, HS, 0200 Melvin, Alexander B, Joel Jones   Given at 10/11/23 2049   ondansetron  (ZOFRAN ) injection 4 mg  4 mg Intravenous Q6H PRN Pokhrel, Laxman, Joel Jones   4 mg at 10/11/23 1613   pantoprazole  (PROTONIX ) EC tablet 40 mg  40 mg Oral Daily Melvin, Alexander B, Joel Jones   40 mg at 10/12/23 4696   polyethylene glycol (MIRALAX  / GLYCOLAX ) packet 17 g  17 g Oral Daily PRN Melvin, Alexander B, Joel Jones       pravastatin  (PRAVACHOL ) tablet 40 mg  40 mg Oral Daily Melvin, Alexander B, Joel Jones   40 mg at 10/12/23 2952   ramipril  (ALTACE ) capsule 5 mg  5 mg Oral Daily Melvin, Alexander B, Joel Jones   5 mg at 10/12/23 8413   sodium chloride  flush (NS) 0.9 % injection 3 mL  3 mL Intravenous Q12H Johnetta Nab, Joel Jones   3 mL at 10/12/23 2440   traMADol  (ULTRAM ) tablet 50 mg  50 mg Oral Q6H PRN Sundil, Subrina, Joel Jones   50 mg at 10/12/23 1027     Medications Prior to Admission  Medication Sig Dispense Refill Last Dose/Taking   albuterol  (PROVENTIL ) (2.5 MG/3ML) 0.083% nebulizer solution Take 3 mLs (2.5 mg total) by nebulization every 6 (six) hours as needed for wheezing or shortness of breath. 360 mL 1 Unknown   amLODipine  (NORVASC ) 5 MG tablet Take 1 tablet (5 mg total) by mouth daily. (Patient taking differently: Take 5 mg by mouth in the morning.) 90 tablet 3 10/07/2023   amoxicillin -clavulanate (AUGMENTIN ) 875-125 MG tablet Take 1 tablet by mouth 2 (two) times daily.   10/07/2023   apixaban  (ELIQUIS ) 5 MG TABS tablet Take 1 tablet (5 mg total) by mouth 2 (two) times daily. 60 tablet 6 10/07/2023 at  6:00 PM   ascorbic acid  (VITAMIN C ) 500 MG tablet Take 500 mg by mouth in the morning.   10/07/2023   calcium carbonate (OSCAL) 1500 (600 Ca) MG TABS tablet Take 600 mg of elemental calcium by mouth in the morning.   10/07/2023   calcium carbonate (TUMS - DOSED IN MG ELEMENTAL CALCIUM) 500 MG chewable tablet Chew 1 tablet by mouth as needed for indigestion or heartburn.   Past Week   cholecalciferol (VITAMIN D3) 25 MCG (1000 UNIT) tablet Take 1,000 Units by mouth in the morning.   10/07/2023   Insulin  Aspart, w/Niacinamide, (FIASP ) 100 UNIT/ML SOLN 77 Units continuous. Insulin  pump   Unknown   insulin  glargine (LANTUS ) 100 UNIT/ML injection Inject 16 Units into the skin as needed (If pump goes out).   Unknown   KRILL OIL PO Take 400 mg by mouth in the morning.   Taking   loratadine  (CLARITIN ) 10 MG tablet Take 10 mg by mouth in the morning.  10/07/2023   methocarbamol  (ROBAXIN ) 500 MG tablet Take 1 tablet (500 mg total) by mouth every 12 (twelve) hours as needed for muscle spasms. (Patient taking differently: Take 500 mg by mouth every 6 (six) hours as needed for muscle spasms.) 20 tablet 1 Taking Differently   Multiple Vitamin (MULTIVITAMIN WITH MINERALS) TABS Take 1 tablet by mouth in the morning. Centrum   10/07/2023   ondansetron   (ZOFRAN -ODT) 4 MG disintegrating tablet Take 1 tablet (4 mg total) by mouth every 8 (eight) hours as needed for nausea or vomiting. 12 tablet 0 Taking As Needed   pantoprazole  (PROTONIX ) 40 MG tablet TAKE 1 TABLET BY MOUTH DAILY (Patient taking differently: Take 40 mg by mouth in the morning.) 90 tablet 3 10/07/2023   Potassium 99 MG TABS Take 99 mg by mouth in the morning.   10/07/2023   pyridOXINE (VITAMIN B6) 50 MG tablet Take 50 mg by mouth in the morning.   10/07/2023   ramipril  (ALTACE ) 5 MG capsule Take 1 capsule (5 mg total) by mouth daily. (Patient taking differently: Take 5 mg by mouth in the morning.) 90 capsule 3 10/07/2023   traMADol  (ULTRAM ) 50 MG tablet Take 1 tablet (50 mg total) by mouth every 6 (six) hours as needed for moderate pain (pain score 4-6). 30 tablet 0 Taking As Needed   vitamin E  400 UNIT capsule Take 400 Units by mouth in the morning.   10/07/2023   zinc gluconate 50 MG tablet Take 50 mg by mouth in the morning.   10/07/2023   Abatacept  (ORENCIA  IV) Inject 750 mg into the vein every 28 (twenty-eight) days. (Patient not taking: Reported on 10/08/2023)   Not Taking   albuterol  (VENTOLIN  HFA) 108 (90 Base) MCG/ACT inhaler Inhale 2 puffs into the lungs every 6 (six) hours as needed for wheezing or shortness of breath. (Patient not taking: Reported on 10/08/2023) 18 g 1 Not Taking   [EXPIRED] HYDROmorphone  (DILAUDID ) 2 MG tablet Take 0.5 tablets (1 mg total) by mouth every 6 (six) hours as needed for up to 3 days for severe pain (pain score 7-10). (Patient not taking: Reported on 10/08/2023) 6 tablet 0 Not Taking   ONE TOUCH ULTRA TEST test strip 1 each daily. Pt says 2-3 times daily  3    pravastatin  (PRAVACHOL ) 40 MG tablet Take 40 mg by mouth in the morning. (Patient not taking: Reported on 10/08/2023)   Not Taking    Family History  Problem Relation Age of Onset   Cancer Father        prostate   Review of Systems:     Cardiac Review of Systems: Y or  [    ]= no  Chest Pain  [ Y, resolved   ]  Resting SOB [ Y, resolved  ] Exertional SOB  [  ]  Orthopnea [  ]   Pedal Edema [   ]    Palpitations [  ] Syncope  [  ]   Presyncope [   ]  General Review of Systems: [Y] = yes [  ]=no Constitional: recent weight change [  ]; anorexia [  ]; fatigue [  ]; nausea Discordia.Diesel  ]; night sweats [  ]; fever [  ]; or chills [  ]  Dental: Last Dentist visit:   Eye : blurred vision [  ]; diplopia [   ]; vision changes [  ];  Amaurosis fugax[  ]; Resp: cough [ N ];  wheezing[  ];  hemoptysis[  ]; shortness of breath[ Y, resolved ]; paroxysmal nocturnal dyspnea[  ]; dyspnea on exertion[  ]; or orthopnea[  ];  GI:  gallstones[  ], vomiting[ N ];  dysphagia[  ]; melena[  ];  hematochezia [  ]; heartburn[  ];   Hx of  Colonoscopy[  ];  Biliary drain in place GU: kidney stones [  ]; hematuria[  ];   dysuria [  ];  nocturia[  ];  history of     obstruction [  ]; urinary frequency [  ]             Skin: rash, swelling[  ];, hair loss[  ];  peripheral edema[  ];  or itching[  ]; Musculosketetal: myalgias[  ];  joint swelling[  ];  joint erythema[  ];  joint pain[  ];  back pain[  ];  Heme/Lymph: bruising[  ];  bleeding[  ];  anemia[  ];  Neuro: TIA[  ];  headaches[  ];  stroke[  ];  vertigo[  ];  seizures[  ];   paresthesias[  ];  difficulty walking[  ];  Psych:depression[  ]; anxiety[  ];  Endocrine: diabetes[ Y ];  thyroid  dysfunction[  ];              Physical Exam: BP (!) 112/53 (BP Location: Left Arm)   Pulse (!) 50   Temp 97.6 F (36.4 C)   Resp 18   Ht 5\' 6"  (1.676 m)   Wt 81.6 kg   SpO2 100%   BMI 29.05 kg/m   General appearance: alert, cooperative, and no distress Head: Normocephalic, without obvious abnormality, atraumatic Resp: diminished right base Cardio: regular rate and rhythm GI: soft, non-tender; bowel sounds normal; no masses,  no organomegaly and trender along RUQ Biliary drain Extremities: extremities normal,  atraumatic, no cyanosis or edema Neurologic: Grossly normal  Diagnostic Studies & Laboratory data:     Recent Radiology Findings:   IR INJECT INDWELLING DRAINAGE CATHETER Result Date: 10/10/2023 CLINICAL DATA:  Prior cholecystectomy, subsequent gallbladder fossa collection, post percutaneous drain catheter placement 09/22/2023. EXAM: SINUS TRACT INJECTION/FISTULOGRAM ANESTHESIA/SEDATION: None required MEDICATIONS: No periprocedural antibiotics were indicated CONTRAST:  5mL OMNIPAQUE  IOHEXOL  300 MG/ML  SOLN PROCEDURE: The procedure, risks (including but not limited to bleeding, infection, organ damage ), benefits, and alternatives were explained to the patient. Questions regarding the procedure were encouraged and answered. The patient understands and consents to the procedure. Under fluoroscopy, contrast was gently injected into the right upper quadrant drain catheter. COMPLICATIONS: None immediate FINDINGS: Initially, contrast tracked back along the course of the catheter exiting at the skin entry site. With slightly more vigorous injection, filling of a collapsed cavity was seen in the gallbladder fossa. No pleural fistula noted. IMPRESSION: 1. No fistula identified. 2. Resolution of subhepatic collection. Electronically Signed   By: Nicoletta Barrier M.D.   On: 10/10/2023 16:16     I have independently reviewed the above radiologic studies and discussed with the patient   Recent Lab Findings: Lab Results  Component Value Date   WBC 8.6 10/10/2023   HGB 9.9 (L) 10/10/2023   HCT 29.4 (L) 10/10/2023   PLT 408 (H) 10/10/2023   GLUCOSE 363 (H) 10/11/2023   CHOL 120 07/03/2023  TRIG 103 07/03/2023   HDL 34 (L) 07/03/2023   LDLCALC 65 07/03/2023   ALT 17 10/10/2023   AST 18 10/10/2023   NA 130 (L) 10/11/2023   K 3.6 10/11/2023   CL 95 (L) 10/11/2023   CREATININE 1.00 10/11/2023   BUN 11 10/11/2023   CO2 24 10/11/2023   TSH 3.03 05/24/2016   INR 1.2 10/05/2023   HGBA1C 8.3 05/07/2023    Assessment / Plan:      Recurrent right pleural effusion, now minimal fluid post thoracentesis however there is near complete collapse of RLL.... developed post Robotic Cholecystectomy performed by Dr. [name] on 4/23 Gallbladder Fossa Abscess- previous cultures grew Enterococcus and Enterobacter he has completed ABX course...biliary level pending from most recent Thoracentesis... IR drain was outside collection, this was replaced on 5/29 with removal of 200 cc bilious drainage and patient feels better since placement   Overall patient is stable.  His respiratory symptoms have resolved.  There is minimal fluid to tap or safely place chest tube per IR.. We have been consulted for possible VATS procedure.  The patient would ideally prefer to not have any further interventions at this time.  Dr. Luna Jones will evaluate and make recommendations... Of note the daughter questions if repeat HIDA Scan would be indicated and we will defer to primary team/IR in this matter.   I  spent 40 minutes counseling the patient face to face.   Joel Kasal, Joel Jones 10/12/2023 10:00 AM  I have seen and examined Joel Jones and reviewed his chest x-ray and CT images.  Underwent a robotic cholecystectomy in April.  Complicated by bile leak/abscess.  Reactive pleural effusion.  Has had thoracentesis x 2.  Recent CT showed a moderate effusion with complete atelectasis of the right lower lobe.  IR unable to place a drain.  Probably not an ideal situation for thrombolytics with multiple previous percutaneous interventions to his gallbladder fossa.  I think his best option would be to do a right VATS to drain pleural effusion and decorticate the right lower lobe.  I described the postoperative procedure to him.  His daughter-in-law was also present.  I informed him of the general nature of the procedure including the need for general anesthesia, the incisions to be used, the use of drains to postoperatively, the goals of care,  expected hospital stay, and the overall recovery.  I informed him of the indications, risks, benefits, and alternatives.  He understands the risks include, but not limited to death, MI, DVT, PE, bleeding, possible need for transfusion, infection, air leaks, cardiac arrhythmias, as well as possibility of other unforeseeable complications.  He is understandably reluctant to undergo another procedure.  He does not want to proceed at this time.  I did explain that this is likely to only get worse over time and is very unlikely to resolve on its own.  I will check with him again tomorrow.  Hopefully he will decide to proceed.  Joel Aloe Luna Salinas, Joel Jones [facility] [phone]

## 2023-10-12 NOTE — Progress Notes (Signed)
 PROGRESS NOTE  Joel Droke Quillin Sr. ZOX:[id number] DOB: [birth year] DOA: 10/08/2023 PCP: [name], MD   LOS: 3 days   Brief narrative:  Joel Patches Haye Sr. is a 78 y.o. male with past medical history significant of hypertension, hyperlipidemia, diabetes, GERD, CAD, atrial fibrillation, bradycardia, obesity, RA, asthma to hospital with recurrent shortness of breath and right-sided chest pain.  Of note, patient had initially presented to [facility] and underwent cholecystectomy on 08/29/2023 and had a follow-up CT scan which showed fluid collection.  Patient presented on 5/9 with continued abdominal pain and was noted to have gastric outlet obstruction with concern for abscess.  He was then admitted and was seen by general surgery and interventional radiology and had IR placed drain which showed Enterococcus and Enterobacter species and had received IV Zosyn .  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  Drain was also reportedly repositioned on 5/23.  Patient has had 2 thoracocentesis for this symptoms.  At this time patient presented with shortness of breath worse on exertion with right-sided chest pain.  In the ED, patient was notable for hyponatremia with sodium of 131.  Leukocytosis at 12.9.  Case was discussed with interventional radiology and was admitted to hospital for further evaluation and treatment.      Assessment/Plan: Principal Problem:   Recurrent pleural effusion on right Active Problems:   CAD (coronary artery disease)   Essential hypertension   Rheumatoid arthritis involving multiple sites with positive rheumatoid factor (HCC)   Gastroesophageal reflux disease   Type 2 diabetes mellitus with other specified complication (HCC)   Obesity   Asthmatic bronchitis , chronic (HCC)   Atrial fibrillation (HCC)   Hyperlipidemia associated with type 2 diabetes mellitus (HCC)   Recurrent  right pleural effusion Status post cholecystectomy History of intra-abdominal abscess status post drain placement History of recurrent right-sided pleural effusion.  History of drain placement for abdominal abscess following cholecystectomy.  IR on board at this time.  Previous perihepatic drain has been removed and new drain tube has been placed at the [facility] site with good drainage at this time.  CT scan of the chest abdomen pelvis done on 10/09/2023 showed interval increase in size of loculated at least small volume right pleural effusion with hilar and mediastinal lymphadenopathy.  Stable to slightly increased size of gallbladder fossa collection of free fluid and gas.   Patient underwent drain injection to discern any fistulous communication without any findings of fistulous communication..  Culture from previous pleural fluid tapping on 5/21 with no growth.    Eliquis  on hold for potential need for procedure.    Pulmonary was consulted due to loculated effusion and at this time recommendation is CT surgery evaluation.  Will reach out to CT surgery team about this finding.  IR has seen the patient today and Recommend follow-up in IR clinic in 1 to 2 weeks.  Hypertension - Continue home amlodipine  and ramipril .  Blood pressure seems to be stable.   Hyperlipidemia Continue atorvastatin   Diabetes melitis type I. On insulin  pump at home.  Had hyperglycemia yesterday show pump was removed and was started on long-acting and sliding scale insulin .  Blood glucose levels better this morning.  Diabetic coordinator on board.   GERD - Continue PPI   CAD - Continue home ramipril , Eliquis    Atrial fibrillation - Holding Eliquis  for now   Obesity - Noted   Rheumatoid arthritis - On  abatacept  outpatient.   Asthma - Continue home as needed albuterol .  Currently compensated.  Left ankle pain   seen by orthopedics.  Has ankle splint in place at this time.  X-ray shows degenerative tibiotalar  spurring.  DVT prophylaxis: Place and maintain sequential compression device Start: 10/08/23 1819   Disposition: Likely home with home health in 1 to 2 days depending upon CT surgery input..  Status is: Inpatient  The patient is inpatient because recurrent pleural effusion, post IR interrogation.    Code Status:     Code Status: Full Code  Family Communication: Spoke with the patient's daughter-in-law at bedside.  Consultants: Interventional radiology General Surgery PCCM Will consult CT surgery   Procedures: Left ankle splint Abdominal drain placement 10/11/2023  Anti-infectives:  None yet  Anti-infectives (From admission, onward)    None       Subjective: Today, patient was seen and examined at bedside.  Patient denies overt pain, nausea vomiting fever chills or rigor.  Complains of feeling a little better with his discomfort after placing the drain yesterday. Objective: Vitals:   10/11/23 1950 10/12/23 0838  BP: 125/61 (!) 112/53  Pulse: 61 (!) 50  Resp: 18 18  Temp: 98.6 F (37 C) 97.6 F (36.4 C)  SpO2: 99% 100%    Intake/Output Summary (Last 24 hours) at 10/12/2023 0937 Last data filed at 10/12/2023 0500 Gross per 24 hour  Intake 1069.48 ml  Output 205 ml  Net 864.48 ml   Filed Weights   10/08/23 1249  Weight: 81.6 kg   Body mass index is 29.05 kg/m.   Physical Exam:  GENERAL: Patient is alert awake and oriented. Not in obvious distress.  Alert awake and oriented.  Appears slightly deconditioned.  On nasal cannula oxygen HENT: No scleral pallor or icterus. Pupils equally reactive to light. Oral mucosa is moist NECK: is supple, no gross swelling noted. CHEST: Decreased respirations bilaterally.  No obvious wheezes noted CVS: S1 and S2 heard, no murmur. Regular rate and rhythm.  ABDOMEN: Soft, non-tender, bowel sounds are present.  Abdominal drain in place with dressing with drainage EXTREMITIES: Left ankle with splint. CNS: Cranial nerves  are intact. No focal motor deficits. SKIN: warm and dry without rashes.  Data Review: I have personally reviewed the following laboratory data and studies,  CBC: Recent Labs  Lab 10/05/23 1933 10/08/23 1306 10/09/23 0422 10/10/23 0602  WBC 10.7* 12.9* 11.0* 8.6  NEUTROABS 8.4*  --   --   --   HGB 12.3* 11.4* 10.5* 9.9*  HCT 36.9* 32.9* 30.9* 29.4*  MCV 87.6 86.1 85.1 87.0  PLT 450* 405* 389 408*   Basic Metabolic Panel: Recent Labs  Lab 10/05/23 1933 10/08/23 1306 10/09/23 0422 10/10/23 0602 10/11/23 1617  NA 127* 131* 130* 129* 130*  K 5.1 3.7 3.6 3.1* 3.6  CL 92* 95* 94* 95* 95*  CO2 22 25 25 25 24   GLUCOSE 512* 113* 107* 86 363*  BUN 16 8 10 9 11   CREATININE 1.22 0.97 0.93 0.84 1.00  CALCIUM 8.3* 8.3* 8.2* 7.7* 8.3*  MG  --   --   --  1.9  --    Liver Function Tests: Recent Labs  Lab 10/05/23 1933 10/09/23 0422 10/10/23 0602  AST 44* 22 18  ALT 32 19 17  ALKPHOS 94 93 79  BILITOT 0.8 0.5 0.6  PROT 6.2* 5.5* 5.2*  ALBUMIN 2.5* 1.9* 1.8*   Recent Labs  Lab 10/05/23 1933  LIPASE 25  No results for input(s): "AMMONIA" in the last 168 hours. Cardiac Enzymes: No results for input(s): "CKTOTAL", "CKMB", "CKMBINDEX", "TROPONINI" in the last 168 hours. BNP (last 3 results) No results for input(s): "BNP" in the last 8760 hours.  ProBNP (last 3 results) No results for input(s): "PROBNP" in the last 8760 hours.  CBG: Recent Labs  Lab 10/11/23 1829 10/11/23 2028 10/12/23 0059 10/12/23 0450 10/12/23 0836  GLUCAP 317* 256* 170* 89 220*   Recent Results (from the past 240 hours)  Body fluid culture w Gram Stain     Status: None   Collection Time: 10/03/23  2:23 PM   Specimen: Lung, Right; Pleural Fluid  Result Value Ref Range Status   Specimen Description PLEURAL  Final   Special Requests RIGHT LUNG  Final   Gram Stain   Final    FEW WBC PRESENT,BOTH PMN AND MONONUCLEAR NO ORGANISMS SEEN    Culture   Final    NO GROWTH 3 DAYS Performed at  [facility], [address], [place], [place] [address]    Report Status 10/06/2023 FINAL  Final     Studies: IR INJECT INDWELLING DRAINAGE CATHETER Result Date: 10/10/2023 CLINICAL DATA:  Prior cholecystectomy, subsequent gallbladder fossa collection, post percutaneous drain catheter placement 09/22/2023. EXAM: SINUS TRACT INJECTION/FISTULOGRAM ANESTHESIA/SEDATION: None required MEDICATIONS: No periprocedural antibiotics were indicated CONTRAST:  5mL OMNIPAQUE  IOHEXOL  300 MG/ML  SOLN PROCEDURE: The procedure, risks (including but not limited to bleeding, infection, organ damage ), benefits, and alternatives were explained to the patient. Questions regarding the procedure were encouraged and answered. The patient understands and consents to the procedure. Under fluoroscopy, contrast was gently injected into the right upper quadrant drain catheter. COMPLICATIONS: None immediate FINDINGS: Initially, contrast tracked back along the course of the catheter exiting at the skin entry site. With slightly more vigorous injection, filling of a collapsed cavity was seen in the gallbladder fossa. No pleural fistula noted. IMPRESSION: 1. No fistula identified. 2. Resolution of subhepatic collection. Electronically Signed   By: Nicoletta Barrier M.D.   On: 10/10/2023 16:16      Rosena Conradi, MD  [facility] 10/12/2023  If 7PM-7AM, please contact night-coverage

## 2023-10-12 NOTE — Progress Notes (Signed)
 NAME:  Joel Garde Gobert Sr., MRN:  [id number], DOB:  06/21/1945, LOS: 3 ADMISSION DATE:  10/08/2023, CONSULTATION DATE: 10/11/2023 REFERRING MD: [facility], CHIEF COMPLAINT: Loculated right pleural effusion in the setting of recent gallbladder surgery  History of Present Illness:  Joel Jones is a 78 year old male former smoker quit 50 years ago who recently underwent cholecystectomy and presented with right pleural effusion that was tapped x 2 and evaluated for possible fistula which was not there.  He also went for third thoracentesis with ultrasound not showing enough fluid to aspirate.  Fluid does appear to be loculated.  Pulmonary critical care asked to evaluate for possible interventions.  Of note cardiovascular thoracic surgery has been called also.  He appears to be improving at this time he is on room air in no acute distress.  Questionable if drain can be removed and followed up as an outpatient.  Pertinent  Medical History   Past Medical History:  Diagnosis Date   A-fib (HCC)    AKI (acute kidney injury) (HCC) 05/18/2021   Anemia    Arthritis    "all over"   Bradycardia    Chronic bronchitis (HCC)    "get it just about q yr" (03/17/2014)   Complication of anesthesia    difficulty waking up   Daily headache    "here lately" (03/17/2014); relates to sinuses   GERD (gastroesophageal reflux disease)    Hard of hearing    hearing aides bilat   Hiatal hernia    History of blood transfusion 2008   "related to OR"   History of kidney stones    Hyperlipidemia    Hypertension    Pneumonia 1972 X 1   Rheumatoid arthritis (HCC)    Type II diabetes mellitus (HCC)    Wears glasses      Significant Hospital Events: Including procedures, antibiotic start and stop dates in addition to other pertinent events   10/11/2023 new 8 [place] mini loop drain placed per interventional radiology with 200 cc drained from interventional radiology standpoint he could be discharged with drain.  Interim History /  Subjective:  Does not remember long conversation about choices for his loculated right pleural effusion CVT S is to be consulted  Objective    Blood pressure (!) 112/53, pulse (!) 50, temperature 97.6 F (36.4 C), resp. rate 18, height 5\' 6"  (1.676 m), weight 81.6 kg, SpO2 100%.        Intake/Output Summary (Last 24 hours) at 10/12/2023 0953 Last data filed at 10/12/2023 0500 Gross per 24 hour  Intake 1069.48 ml  Output 205 ml  Net 864.48 ml   Filed Weights   10/08/23 1249  Weight: 81.6 kg    Examination: No acute distress at rest although he has short-term memory issues Status post new drain placement by interventional radiology with copious drainage No JVD or lymphadenopathy is appreciated Decreased breath sounds on the right base Heart sounds are regular Abdomen with drain with dirty bile drainage Extremity with mild edema  Resolved problem list   Assessment and Plan  Right loculated pleural effusion in the setting of recent gallbladder surgery with drain in place and last ultrasound revealing not sufficient fluid for thoracentesis appears loculated.  Pulmonary critical care is being consulted along with thoracic surgery for possible interventions.  Note that he is getting better does not appear to have a fistula from CT scans.  Possibly his drain could be removed from his abdomen.  He is in no respiratory distress whatsoever  Patient was evaluated by Dr. Racheal Jones who recommended thoracic consult for possible VATS 10/12/2023 reiterated that CVTS needs to be consulted He had a new drain placed in his gallbladder area  Previous history of cholecystectomy Diabetes Per primary  Best Practice (right click and "Reselect all SmartList Selections" daily)   Diet/type: Regular consistency (see orders) DVT prophylaxis not indicated Pressure ulcer(s): N/A GI prophylaxis: PPI Lines: N/A Foley:  N/A Code Status:  full code Last date of multidisciplinary goals of care  discussion [tbd]  Labs   CBC: Recent Labs  Lab 10/05/23 1933 10/08/23 1306 10/09/23 0422 10/10/23 0602  WBC 10.7* 12.9* 11.0* 8.6  NEUTROABS 8.4*  --   --   --   HGB 12.3* 11.4* 10.5* 9.9*  HCT 36.9* 32.9* 30.9* 29.4*  MCV 87.6 86.1 85.1 87.0  PLT 450* 405* 389 408*    Basic Metabolic Panel: Recent Labs  Lab 10/05/23 1933 10/08/23 1306 10/09/23 0422 10/10/23 0602 10/11/23 1617  NA 127* 131* 130* 129* 130*  K 5.1 3.7 3.6 3.1* 3.6  CL 92* 95* 94* 95* 95*  CO2 22 25 25 25 24   GLUCOSE 512* 113* 107* 86 363*  BUN 16 8 10 9 11   CREATININE 1.22 0.97 0.93 0.84 1.00  CALCIUM 8.3* 8.3* 8.2* 7.7* 8.3*  MG  --   --   --  1.9  --    GFR: Estimated Creatinine Clearance: 63 mL/min (by C-G formula based on SCr of 1 mg/dL). Recent Labs  Lab 10/05/23 1933 10/05/23 1937 10/05/23 2133 10/08/23 1306 10/09/23 0422 10/10/23 0602  WBC 10.7*  --   --  12.9* 11.0* 8.6  LATICACIDVEN  --  1.6 1.8  --   --   --     Liver Function Tests: Recent Labs  Lab 10/05/23 1933 10/09/23 0422 10/10/23 0602  AST 44* 22 18  ALT 32 19 17  ALKPHOS 94 93 79  BILITOT 0.8 0.5 0.6  PROT 6.2* 5.5* 5.2*  ALBUMIN 2.5* 1.9* 1.8*   Recent Labs  Lab 10/05/23 1933  LIPASE 25   No results for input(s): "AMMONIA" in the last 168 hours.  ABG    Component Value Date/Time   TCO2 31 09/21/2023 0739     Coagulation Profile: No results for input(s): "INR", "PROTIME" in the last 168 hours.   Cardiac Enzymes: No results for input(s): "CKTOTAL", "CKMB", "CKMBINDEX", "TROPONINI" in the last 168 hours.  HbA1C: Hemoglobin A1C  Date/Time Value Ref Range Status  05/07/2023 12:00 AM 8.3  Final  08/02/2021 12:00 AM 7.2  Final   HbA1c, POC (controlled diabetic range)  Date/Time Value Ref Range Status  06/27/2022 02:50 PM 8.1 (A) 0.0 - 7.0 % Final    CBG: Recent Labs  Lab 10/11/23 1829 10/11/23 2028 10/12/23 0059 10/12/23 0450 10/12/23 0836  GLUCAP 317* 256* 170* 89 220*        Steve  Tabitha Tupper ACNP Acute Care Nurse Practitioner [facility] Pulmonary/Critical Care Please consult Amion 10/12/2023, 9:53 AM

## 2023-10-12 NOTE — Care Management Important Message (Signed)
 Important Message  Patient Details  Name: Joel Noe Settlemire Sr. MRN: [id number] Date of Birth: 07/24/1945   Important Message Given:  Yes - Medicare IM     Wynonia Hedges 10/12/2023, 3:27 PM

## 2023-10-12 NOTE — Inpatient Diabetes Management (Signed)
 Inpatient Diabetes Program Recommendations  AACE/ADA: New Consensus Statement on Inpatient Glycemic Control (2015)  Target Ranges:  Prepandial:   less than 140 mg/dL      Peak postprandial:   less than 180 mg/dL (1-2 hours)      Critically ill patients:  140 - 180 mg/dL   Lab Results  Component Value Date   GLUCAP 238 (H) 10/12/2023   HGBA1C 8.3 05/07/2023    Review of Glycemic Control  Latest Reference Range & Units 10/12/23 04:50 10/12/23 08:36 10/12/23 12:06  Glucose-Capillary 70 - 99 mg/dL 89 161 (H) 096 (H)  Diabetes history: DM2 Outpatient Diabetes medications:  Joel Jones Insulin  Pump with Fiasp  Basal-1.20/hr--28.8 units/day Carb ratio 00:00-07:00-15 07:00-11:30-10 11:30-00:00-15 Target BG 90-110 Insulin  sensitivity factor--1 units brings him down 50 mg/dL Current orders:  Novolog  0-6 units q 4 hours Novolog  3 units tid with meals Semglee  28 units daily Inpatient Diabetes Program Recommendations:   Spoke with patient regarding DM management.   He states that he is interested in CGM.  Educated patient and daughter in law on use of CGM. Patient Joel Jones require another procedure while in the hospital so will wait on applying CGM.  Taught daughter in law how to set-up CGM once patient gets home and gave her sample CGM and booklet regarding CGM.  Recommend continuation of of basal/bolus until patient discharged home.  Reminded patient that he needs to wait 24 hours after basal insulin  to resume insulin  pump.  Patient and daughter in law verified understanding.   Thanks,  Josefa Ni, RN, BC-ADM Inpatient Diabetes Coordinator Pager [phone]  (8a-5p)

## 2023-10-12 NOTE — Discharge Instructions (Addendum)
 horacotomy, Care After After a thoracotomy, it is common to have redness, pain, and swelling around the cut from surgery (incision). You Joel Jones also have: Fluid or blood coming from the incision. Pain when you breathe in. Trouble pooping. Tiredness. Lack of interest in eating and drinking. Trouble sleeping. Follow these instructions at home: The instructions below Joel Jones help you care for yourself at home. Your doctor Joel Jones give you more instructions. If you have questions, ask your health care provider. Preventing lung infection  Take deep breaths or do breathing exercises as told by your doctor. Cough often. If it hurts to cough, hold a pillow against your chest or place both hands flat on top of your cut when you cough. Do lung therapy (pulmonary rehabilitation) as told by your doctor. Use an incentive spirometer as told by your doctor. This is a tool that shows how well you fill your lungs with each breath. Medicines Take over-the-counter and prescription medicines only as told by your doctor. If you have pain, take medicine before the pain gets very bad. This will make it easier to breathe and cough. If you were prescribed antibiotics, take them as told by your doctor. Do not stop taking them even if you start to feel better. If told, take steps to prevent problems with pooping (constipation). You Joel Jones need to: Drink enough fluid to keep your pee (urine) pale yellow. Take medicines. You will be told what medicines to take. Eat foods that are high in fiber. These include beans, whole grains, and fresh fruits and vegetables. Limit foods that are high in fat and sugar. These include fried or sweet foods. Ask your doctor if you should avoid driving or using machines while you are taking your medicine. Activity  Rest as told by your doctor. Do not get on an airplane until your doctor says that it is safe. You Joel Jones have to avoid lifting. Ask your doctor how much you can safely lift. Incision  care  Follow instructions from your doctor about how to take care of your incision. Make sure you: Wash your hands with soap and water  for at least 20 seconds before and after you change your bandage. If you cannot use soap and water , use hand sanitizer. Change your bandage. Leave stitches or skin glue in place for at least 2 weeks. Leave tape strips alone unless you are told to take them off. You Joel Jones trim the edges of the tape strips if they curl up. Keep your bandage dry. Check the area around your incision every day for signs of infection. Check for: More redness, swelling, or pain. More fluid or blood. Warmth. Pus or a bad smell. Wash your incision after your bandage has been taken off. Only use soap and water . Lifestyle Do not take baths, swim, or use a hot tub. Ask your doctor about taking showers or sponge baths. Do not smoke or use any products that contain nicotine or tobacco. If you need help quitting, ask your doctor. Stay away when other people are smoking. Eat a healthy diet as told by your doctor. A healthy diet has: Fresh fruits and vegetables. Whole grains. Low-fat (lean) proteins. General instructions Wear compression stockings as told by your doctor. If you have a chest tube, care for it as told by your doctor. Keep all follow-up visits. Your doctor will need to take out your chest tube. They Joel Jones also need to check how well you are healing. Contact a doctor if: You have signs of an infection. Your  heartbeat seems off. You vomit or feel like vomiting. You have pain in your muscles, or you feel weak. You get a rash. Get help right away if: You have very bad pain or pain that gets worse even when you take medicine. You have trouble breathing. You develop signs of a blood clot. These Joel Jones include: You have trouble talking. You are confused. You can't see well. You can't move. You lose feeling in your face, arms, or legs (feel numb). You faint. You have a very  bad headache all of a sudden. You have chest pain. These symptoms Joel Jones be an emergency. Get help right away. Call 911. Do not wait to see if the symptoms will go away. Do not drive yourself to the hospital. This information is not intended to replace advice given to you by your health care provider. Make sure you discuss any questions you have with your health care provider. Document Revised: 10/26/2021 Document Reviewed: 10/26/2021 Elsevier Patient Education  2024 [employer]    Interventional Radiology Percutaneous Biloma Drain Placement After Care   This sheet gives you information about how to care for yourself after your procedure. Your health care provider Joel Jones also give you more specific instructions. Your drain was placed by an interventional radiologist with [facility]. If you have questions or concerns, contact [facility] at [phone].   What is a percutaneous drain?   A drain is a small plastic tube (catheter) that goes into the fluid collection in your body through your skin.   How long will I need the drain?   How long the drain needs to stay in is determined by where the drain is, how much comes out of the drain each day and if you are having any other surgical procedures.   Interventional radiology will determine when it is time to remove the drain. It is important to follow up as directed so that the drain can be removed as soon as it is safe to do so.   What can I expect after the procedure?   After the procedure, it is common to have:   A small amount of bruising and discomfort in the area where the drainage tube (catheter) was placed.   Sleepiness and fatigue. This should go away after the medicines you were given have worn off.   Follow these instructions at home:   Insertion site care   Check your insertion site when you change the bandage. Check for:   More redness, swelling, or pain.   More fluid or blood.   Warmth.   Pus  or a bad smell.   When caring for your insertion site:   Wash your hands with soap and water  for at least 20 seconds before and after you change your bandage (dressing). If soap and water  are not available, use hand sanitizer.   You do not need to change your dressing everyday if it is clean and dry. Change your dressing every 3 days or as needed when it is soiled, wet or becoming dislodged. You will need to change your dressing each time you shower.   Leave stitches (sutures), skin glue, or adhesive strips in place. These skin closures Joel Jones need to stay in place for 2 weeks or longer. If adhesive strip edges start to loosen and curl up, you Joel Jones trim the loose edges. Do not remove adhesive strips completely unless your health care provider tells you to do so.   Catheter care   Flush the catheter  once per day with 5 mL of 0.9% normal saline unless you are told otherwise by your healthcare provider. This helps to prevent clogs in the catheter.   To disconnect the drain, turn the clear plastic tube to the left. Attach the saline syringe by placing it on the white end of the drain and turning gently to the right. Once attached gently push the plunger to the 5 mL mark. After you are done flushing, disconnect the syringe by turning to the left and reattach your drainage container   If you have a bulb please be sure the bulb is charged after reconnecting it - to do this pinch the bulb between your thumb and first finger and close the stopper located on the top of the bulb.    Check for fluid leaking from around your catheter (instead of fluid draining through your catheter). This [place] be a sign that the drain is no longer working correctly.   Write down the following information every time you empty your bag:   The date and time.   The amount of drainage.   Activity   Rest at home for 1-2 days after your procedure.   For the first 48 hours do not lift anything more than 10 lbs (about a gallon of  milk). You Joel Jones perform moderate activities/exercise. Please avoid strenuous activities during this time.   Avoid any activities which Joel Jones pull on your drain as this can cause your drain to become dislodged.   If you were given a sedative during the procedure, it can affect you for several hours. Do not drive or operate machinery until your health care provider says that it is safe.   General instructions   For mild pain take over-the-counter medications as needed for pain such as Tylenol  or Advil . If you are experiencing severe pain please call our office as this Joel Jones indicate an issue with your drain.    If you were prescribed an antibiotic medicine, take it as told by your health care provider. Do not stop using the antibiotic even if you start to feel better.   You Joel Jones shower 24 hours after the drain is placed. To do this cover the insertion site with a water  tight material such as saran wrap and seal the edges with tape, you Joel Jones also purchase waterproof dressings at your local drug store. Shower as usual and then remove the water  tight dressing and any gauze/tape underneath it once you have exited the shower and dried off. Allow the area to air dry or pat dry with a clean towel. Once the skin is completely dry place a new gauze dressing. It is important to keep the site dry at all times to prevent infection.   Do not submerge the drain - this means you cannot take baths, swim, use a hot tub, etc. until the drain is removed.    Do not use any products that contain nicotine or tobacco, such as cigarettes, e-cigarettes, and chewing tobacco. If you need help quitting, ask your health care provider.   Keep all follow-up visits as told by your health care provider. This is important.   Contact a health care provider if:   You have less than 10 mL of drainage a day for 2-3 days in a row, or as directed by your health care provider.   You have any of these signs of infection:   More redness,  swelling, or pain around your incision area.   More fluid or blood  coming from your incision area.   Warmth coming from your incision area.   Pus or a bad smell coming from your incision area.   You have fluid leaking from around your catheter (instead of through your catheter).   You are unable to flush the drain.   You have a fever or chills.   You have pain that does not get better with medicine.   You have not been contacted to schedule a drain follow up appointment within 10 days of discharge from the hospital.   Please call [facility] at [phone] with any questions or concerns.   Get help right away if:   Your catheter comes out.   You suddenly stop having drainage from your catheter.   You suddenly have blood in the fluid that is draining from your catheter.   You become dizzy or you faint.   You develop a rash.   You have nausea or vomiting.   You have difficulty breathing or you feel short of breath.   You develop chest pain.   You have problems with your speech or vision.   You have trouble balancing or moving your arms or legs.   Summary   It is common to have a small amount of bruising and discomfort in the area where the drainage tube (catheter) was placed. You [place] also have minor discomfort with movement while the drain is in place.   Flush the drain once per day with 5 mL of 0.9% normal saline (unless you were told otherwise by your healthcare provider).    Record the amount of drainage from the bag every time you empty it.   Change the dressing every 3 days or earlier if soiled/wet. Keep the skin dry under the dressing.   You [place] shower with the drain in place. Do not submerge the drain (no baths, swimming, hot tubs, etc.).   Contact  Radiology at [phone] if you have more redness, swelling, or pain around your incision area or if you have pain that does not get better with medicine.   This information is not  intended to replace advice given to you by your health care provider. Make sure you discuss any questions you have with your health care provider.   Document Revised: 08/04/2021 Document Reviewed: 04/26/2019   Elsevier Patient Education  2023 [employer]         Interventional Radiology Drain Record   Empty your drain at least once per day. You [place] empty it as often as needed. Use this form to write down the amount of fluid that has collected in the drainage container. Bring this form with you to your follow-up visits. Please call [facility]iology at [phone] with any questions or concerns prior to your appointment.   Drain #1 location: ___________________   Date __________ Time __________ Amount __________   Date __________ Time __________ Amount __________   Date __________ Time __________ Amount __________   Date __________ Time __________ Amount __________   Date __________ Time __________ Amount __________   Date __________ Time __________ Amount __________   Date __________ Time __________ Amount __________   Date __________ Time __________ Amount __________   Date __________ Time __________ Amount __________   Date __________ Time __________ Amount __________   Date __________ Time __________ Amount __________   Date __________ Time __________ Amount __________   Date __________ Time __________ Amount __________   Date __________ Time __________ Amount __________

## 2023-10-12 NOTE — Progress Notes (Signed)
 Patient ID: Joel Domingo Grabbe Sr., male   DOB: Mar 18, 1946, 78 y.o.   MRN: [id number]    Referring Physician(s): Dr. Rosena Conradi   Supervising Physician: Creasie Doctor  Patient Status:  [facility] - In-pt  Chief Complaint:  Gallbladder fluid collection, s/p new 8 Fr mini loop drain placed 10/11/23  Subjective:  Patient had well defined fluid collection in gallbladder fossa on re-evaluation yesterday with the indwelling drain positioned just outside the fluid collection. The prior indwelling drain was removed with a new drain placed in the fluid collection.   Pt feeling much improved today. Tolerating PO intake. 205 cc output over last 12 hours since placement. Says he is ready to go home.   Allergies: Codeine, Lipitor [atorvastatin], Invokana [canagliflozin], Losartan, Roxicodone  [oxycodone ], and Vicodin [hydrocodone -acetaminophen ]  Medications: Prior to Admission medications   Medication Sig Start Date End Date Taking? Authorizing Provider  albuterol  (PROVENTIL ) (2.5 MG/3ML) 0.083% nebulizer solution Take 3 mLs (2.5 mg total) by nebulization every 6 (six) hours as needed for wheezing or shortness of breath. 10/02/23  Yes [name], MD  amLODipine  (NORVASC ) 5 MG tablet Take 1 tablet (5 mg total) by mouth daily. Patient taking differently: Take 5 mg by mouth in the morning. 09/04/23  Yes [name], MD  amoxicillin -clavulanate (AUGMENTIN ) 875-125 MG tablet Take 1 tablet by mouth 2 (two) times daily.   Yes [provider]  apixaban  (ELIQUIS ) 5 MG TABS tablet Take 1 tablet (5 mg total) by mouth 2 (two) times daily. 07/16/23  Yes Lenise Quince, MD  ascorbic acid  (VITAMIN C ) 500 MG tablet Take 500 mg by mouth in the morning.   Yes [provider]  calcium carbonate (OSCAL) 1500 (600 Ca) MG TABS tablet Take 600 mg of elemental calcium by mouth in the morning.   Yes [provider]  calcium carbonate (TUMS - DOSED IN MG ELEMENTAL CALCIUM) 500 MG chewable tablet Chew  1 tablet by mouth as needed for indigestion or heartburn.   Yes [provider]  cholecalciferol (VITAMIN D3) 25 MCG (1000 UNIT) tablet Take 1,000 Units by mouth in the morning.   Yes [provider]  Insulin  Aspart, w/Niacinamide, (FIASP ) 100 UNIT/ML SOLN 77 Units continuous. Insulin  pump 09/27/22  Yes [provider]  insulin  glargine (LANTUS ) 100 UNIT/ML injection Inject 16 Units into the skin as needed (If pump goes out).   Yes [provider]  KRILL OIL PO Take 400 mg by mouth in the morning.   Yes [provider]  loratadine  (CLARITIN ) 10 MG tablet Take 10 mg by mouth in the morning.   Yes [provider]  methocarbamol  (ROBAXIN ) 500 MG tablet Take 1 tablet (500 mg total) by mouth every 12 (twelve) hours as needed for muscle spasms. Patient taking differently: Take 500 mg by mouth every 6 (six) hours as needed for muscle spasms. 09/28/23  Yes [name], MD  Multiple Vitamin (MULTIVITAMIN WITH MINERALS) TABS Take 1 tablet by mouth in the morning. Centrum   Yes [provider]  ondansetron  (ZOFRAN -ODT) 4 MG disintegrating tablet Take 1 tablet (4 mg total) by mouth every 8 (eight) hours as needed for nausea or vomiting. 09/09/23  Yes Ninetta Basket, MD  pantoprazole  (PROTONIX ) 40 MG tablet TAKE 1 TABLET BY MOUTH DAILY Patient taking differently: Take 40 mg by mouth in the morning. 06/27/23  Yes Mealor, Augustus E, MD  Potassium 99 MG TABS Take 99 mg by mouth in the morning.   Yes [provider]  pyridOXINE (VITAMIN B6) 50 MG tablet Take 50 mg by mouth in the morning.   Yes [provider]  ramipril  (ALTACE ) 5 MG capsule Take 1 capsule (5 mg total) by mouth daily. Patient taking differently: Take 5 mg by mouth in the morning. 09/04/23  Yes [name], MD  traMADol  (ULTRAM ) 50 MG tablet Take 1 tablet (50 mg total) by mouth every 6 (six) hours as needed for moderate pain (pain score 4-6). 09/25/23  Yes [place],  Eric J, DO  vitamin E  400 UNIT capsule Take 400 Units by mouth in the morning.   Yes [provider]  zinc gluconate 50 MG tablet Take 50 mg by mouth in the morning.   Yes [provider]  Abatacept  (ORENCIA  IV) Inject 750 mg into the vein every 28 (twenty-eight) days. Patient not taking: Reported on 10/08/2023    [provider]  albuterol  (VENTOLIN  HFA) 108 (90 Base) MCG/ACT inhaler Inhale 2 puffs into the lungs every 6 (six) hours as needed for wheezing or shortness of breath. Patient not taking: Reported on 10/08/2023 10/01/23   [name], MD  ONE TOUCH ULTRA TEST test strip 1 each daily. Pt says 2-3 times daily 05/26/16   [provider]  pravastatin  (PRAVACHOL ) 40 MG tablet Take 40 mg by mouth in the morning. Patient not taking: Reported on 10/08/2023    [provider]     Vital Signs: BP (!) 112/53 (BP Location: Left Arm)   Pulse (!) 50   Temp 97.6 F (36.4 C)   Resp 18   Ht 5\' 6"  (1.676 m)   Wt 180 lb (81.6 kg)   SpO2 100%   BMI 29.05 kg/m   Physical Exam Vitals and nursing note reviewed.  Constitutional:      General: He is not in acute distress. Cardiovascular:     Rate and Rhythm: Normal rate and regular rhythm.  Pulmonary:     Effort: Pulmonary effort is normal.  Abdominal:     Palpations: Abdomen is soft.     Comments: RUQ drain, mild soreness over superior aspect of drain. No overlying abnormality. Small amount of bilious output noted in bag - was emptied by nurse recently. 205cc out over last 12 hours on I/Os  Musculoskeletal:     Right lower leg: No edema.     Left lower leg: No edema.  Skin:    General: Skin is warm and dry.  Neurological:     Mental Status: He is alert and oriented to person, place, and time. Mental status is at baseline.     Imaging: IR INJECT INDWELLING DRAINAGE CATHETER Result Date: 10/10/2023 CLINICAL DATA:  Prior cholecystectomy, subsequent gallbladder fossa collection, post  percutaneous drain catheter placement 09/22/2023. EXAM: SINUS TRACT INJECTION/FISTULOGRAM ANESTHESIA/SEDATION: None required MEDICATIONS: No periprocedural antibiotics were indicated CONTRAST:  5mL OMNIPAQUE  IOHEXOL  300 MG/ML  SOLN PROCEDURE: The procedure, risks (including but not limited to bleeding, infection, organ damage ), benefits, and alternatives were explained to the patient. Questions regarding the procedure were encouraged and answered. The patient understands and consents to the procedure. Under fluoroscopy, contrast was gently injected into the right upper quadrant drain catheter. COMPLICATIONS: None immediate FINDINGS: Initially, contrast tracked back along the course of the catheter exiting at the skin entry site. With slightly more vigorous injection, filling of a collapsed cavity was seen in the gallbladder fossa. No pleural fistula noted. IMPRESSION: 1. No fistula identified. 2. Resolution of subhepatic collection. Electronically Signed  By: Nicoletta Barrier M.D.   On: 10/10/2023 16:16   IR US  CHEST Result Date: 10/10/2023 INDICATION: 78 year old male with history of right pleural effusion presents with shortness of breast. Request for therapeutic and diagnostic thoracentesis. EXAM: CHEST ULTRASOUND COMPARISON:  Chest CT 10/05/2023 FINDINGS: Bilateral pleural space was visualized with ultrasound which showed no pleural effusion in the left and very small, loculated pleural effusion in the right. There was no safe window for ultrasound-guided thoracentesis. IMPRESSION: Small loculated right pleural effusion, no safe window for ultrasound-guided thoracentesis. Thoracentesis was not performed. Performed by: Aimee Han, PA-C Electronically Signed   By: Elene Griffes M.D.   On: 10/10/2023 11:18   CT CHEST ABDOMEN PELVIS W CONTRAST Result Date: 10/09/2023 CLINICAL DATA:  SOB, GB fossa drain in place EXAM: CT CHEST, ABDOMEN, AND PELVIS WITH CONTRAST TECHNIQUE: Multidetector CT imaging of the chest, abdomen  and pelvis was performed following the standard protocol during bolus administration of intravenous contrast. RADIATION DOSE REDUCTION: This exam was performed according to the departmental dose-optimization program which includes automated exposure control, adjustment of the mA and/or kV according to patient size and/or use of iterative reconstruction technique. CONTRAST:  75mL OMNIPAQUE  IOHEXOL  350 MG/ML SOLN COMPARISON:  CT chest abdomen pelvis 10/05/2023, CT abdomen pelvis 09/21/2023 FINDINGS: CT CHEST FINDINGS Cardiovascular: Normal heart size. No significant pericardial effusion. The thoracic aorta is normal in caliber. Mild atherosclerotic plaque of the thoracic aorta. At least 3 vessel coronary artery calcifications. Mediastinum/Nodes: Stable mediastinal and right hilar lymphadenopathy with as an example a 1.2 cm precarinal lymph node (3:20) and a 1.1 cm right hilar lymph node (3:20). No left hilar lymph node. Calcified right hilar lymph nodes suggestive of granulomatous disease. No axillary lymph nodes. Thyroid  gland, trachea, and esophagus demonstrate no significant findings. Lungs/Pleura: Punctate calcification in the right lower lobe suggestive of granulomatous disease. Left lower lobe atelectasis. No focal consolidation. No pulmonary nodule. No pulmonary mass. Interval increase in size of a loculated at least small volume right pleural effusion. Almost complete collapse of the right lower lobe. No left pleural effusion. No pneumothorax. Musculoskeletal: No chest wall abnormality. No suspicious lytic or blastic osseous lesions. No acute displaced fracture. Multilevel degenerative changes of the spine. CT ABDOMEN PELVIS FINDINGS Hepatobiliary: No focal liver abnormality. Status post cholecystectomy. Grossly stable to slightly increased in size 3.7 x 2.2 x 4.5 cm gallbladder fossa collection of free fluid and foci of gas (6:103, 3:54). Trace associated gallbladder fossa/porta hepatics fat stranding. No  biliary dilatation. Pancreas: No focal lesion. Normal pancreatic contour. No surrounding inflammatory changes. No main pancreatic ductal dilatation. Spleen: Normal in size without focal abnormality. Adrenals/Urinary Tract: No adrenal nodule bilaterally. Bilateral kidneys enhance symmetrically. Fluid density lesions within the kidneys likely represents a simple renal cysts. Simple renal cysts, in the absence of clinically indicated signs/symptoms, require no independent follow-up. Subcentimeter hypodensities are too small to characterize-no further follow-up indicated. No hydronephrosis. No hydroureter. No nephroureterolithiasis bilaterally. The urinary bladder is unremarkable. On delayed imaging, there is no urothelial wall thickening and there are no filling defects in the opacified portions of the bilateral collecting systems or ureters. Stomach/Bowel: Stomach is within normal limits. No evidence of bowel wall thickening or dilatation. Colonic diverticulosis. Appendix appears normal. Vascular/Lymphatic: The main portal, splenic, superior mesenteric veins are patent. No abdominal aorta or iliac aneurysm. Mild atherosclerotic plaque of the aorta and its branches. No abdominal, pelvic, or inguinal lymphadenopathy. Reproductive: Prostate is unremarkable. Other: No intraperitoneal free fluid. No intraperitoneal free gas. No organized  fluid collection. Musculoskeletal: No abdominal wall hernia or abnormality. No suspicious lytic or blastic osseous lesions. No acute displaced fracture. Multilevel degenerative changes of the spine. IMPRESSION: 1. Interval increase in size of a loculated at least small volume right pleural effusion. Almost complete collapse of the right lower lobe. 2. Likely reactive in etiology stable right hilar and mediastinal lymphadenopathy. Recommend attention on follow-up. 3. Grossly stable to slightly increased in size 3.7 x 2.2 x 4.5 cm gallbladder fossa collection of free fluid and foci of gas.  4. Colonic diverticulosis with no acute diverticulitis. 5. Sequelae of prior granulomatous disease. 6.  Aortic Atherosclerosis (ICD10-I70.0). Electronically Signed   By: Morgane  Naveau M.D.   On: 10/09/2023 20:30   DG Ankle 2 Views Left Result Date: 10/09/2023 CLINICAL DATA:  Left ankle pain. EXAM: LEFT ANKLE - 2 VIEW COMPARISON:  None Available. FINDINGS: There is no evidence of fracture, dislocation, or joint effusion. Ankle mortise is preserved. Mild tibial talar spurring. Moderate plantar calcaneal spur. No erosions or suspicious bone lesion. No focal soft tissue abnormality. IMPRESSION: 1. Mild degenerative tibiotalar spurring. 2. Moderate plantar calcaneal spur. Electronically Signed   By: Chadwick Colonel M.D.   On: 10/09/2023 15:43   DG Chest 2 View Result Date: 10/08/2023 EXAM: 2 VIEW(S) XRAY OF THE CHEST 10/08/2023 02:27:00 PM COMPARISON: 1 view chest x-ray 10/05/2023 CT of the chest 10/05/2023. CLINICAL HISTORY: Shortness of breath. SOB, hx of bradycardia, hiatal hernia. FINDINGS: LUNGS AND PLEURA: Progressive loculated right pleural effusion and right lower lobe airspace disease are present. Small left pleural effusion and atelectasis are now present. The lung volumes are low. HEART AND MEDIASTINUM: No acute abnormality of the cardiac and mediastinal silhouettes. BONES AND SOFT TISSUES: No acute osseous abnormality. IMPRESSION: 1. Progressive loculated right pleural effusion and right lower lobe airspace disease. 2. New small left pleural effusion and atelectasis. 3. Low lung volumes. Electronically signed by: Audree Leas MD 10/08/2023 02:38 PM EDT RP Workstation: [id number]    Labs:  CBC: Recent Labs    10/05/23 1933 10/08/23 1306 10/09/23 0422 10/10/23 0602  WBC 10.7* 12.9* 11.0* 8.6  HGB 12.3* 11.4* 10.5* 9.9*  HCT 36.9* 32.9* 30.9* 29.4*  PLT 450* 405* 389 408*    COAGS: Recent Labs    05/28/23 1737 06/01/23 1343 08/13/23 0852 08/17/23 1413 09/22/23 0359  10/05/23 0905  INR 2.8*   < > 2.3 1.1 1.2 1.2  APTT 50*  --   --   --   --   --    < > = values in this interval not displayed.    BMP: Recent Labs    10/08/23 1306 10/09/23 0422 10/10/23 0602 10/11/23 1617  NA 131* 130* 129* 130*  K 3.7 3.6 3.1* 3.6  CL 95* 94* 95* 95*  CO2 25 25 25 24   GLUCOSE 113* 107* 86 363*  BUN 8 10 9 11   CALCIUM 8.3* 8.2* 7.7* 8.3*  CREATININE 0.97 0.93 0.84 1.00  GFRNONAA >60 >60 >60 >60    LIVER FUNCTION TESTS: Recent Labs    09/21/23 0734 10/05/23 1933 10/09/23 0422 10/10/23 0602  BILITOT 1.0 0.8 0.5 0.6  AST 34 44* 22 18  ALT 20 32 19 17  ALKPHOS 99 94 93 79  PROT 6.7 6.2* 5.5* 5.2*  ALBUMIN 2.9* 2.5* 1.9* 1.8*    Assessment and Plan:  S/p new RUQ gallbladder fossa 8 Fr mini loop drain placement yesterday, 5/29  - Drain appears well. 205 cc out over last 12 hours.  Days prior had 0 output as drain was malpositioned - Pt feels much improved form yesterday, reports immediate relief after new drain placed. Ok for discharge per IR perspective. Will follow up in our clinic in 1-2 weeks - ordered placed for this. IR scheduler will contact patient to arrange this. NS flushes ordered to [facility].  Current examination: Insertion site unremarkable. Suture and stat lock in place. Dressed appropriately.   Discharge planning: Patient will follow up with IR clinic 10-14 days post d/c for repeat imaging/possible drain injection. IR scheduler will contact patient with date/time of appointment. Patient will need to flush drain QD with 5 cc NS, record output QD, dressing changes every 2-3 days or earlier if soiled.    Electronically Signed: Nicolasa Barrett, PA-C 10/12/2023, 9:10 AM   I spent a total of 25 Minutes at the the patient's bedside AND on the patient's hospital floor or unit, greater than 50% of which was counseling/coordinating care for RUQ gallbladder fossa drain follow up.

## 2023-10-13 ENCOUNTER — Inpatient Hospital Stay (HOSPITAL_COMMUNITY)

## 2023-10-13 DIAGNOSIS — J90 Pleural effusion, not elsewhere classified: Secondary | ICD-10-CM | POA: Diagnosis not present

## 2023-10-13 LAB — CBC
HCT: 35.4 % — ABNORMAL LOW (ref 39.0–52.0)
Hemoglobin: 11.6 g/dL — ABNORMAL LOW (ref 13.0–17.0)
MCH: 28.7 pg (ref 26.0–34.0)
MCHC: 32.8 g/dL (ref 30.0–36.0)
MCV: 87.6 fL (ref 80.0–100.0)
Platelets: 585 10*3/uL — ABNORMAL HIGH (ref 150–400)
RBC: 4.04 MIL/uL — ABNORMAL LOW (ref 4.22–5.81)
RDW: 13 % (ref 11.5–15.5)
WBC: 9.7 10*3/uL (ref 4.0–10.5)
nRBC: 0 % (ref 0.0–0.2)

## 2023-10-13 LAB — TYPE AND SCREEN
ABO/RH(D): O POS
Antibody Screen: NEGATIVE

## 2023-10-13 LAB — GLUCOSE, CAPILLARY
Glucose-Capillary: 101 mg/dL — ABNORMAL HIGH (ref 70–99)
Glucose-Capillary: 111 mg/dL — ABNORMAL HIGH (ref 70–99)
Glucose-Capillary: 148 mg/dL — ABNORMAL HIGH (ref 70–99)
Glucose-Capillary: 165 mg/dL — ABNORMAL HIGH (ref 70–99)
Glucose-Capillary: 271 mg/dL — ABNORMAL HIGH (ref 70–99)
Glucose-Capillary: 288 mg/dL — ABNORMAL HIGH (ref 70–99)
Glucose-Capillary: 74 mg/dL (ref 70–99)

## 2023-10-13 LAB — PROTIME-INR
INR: 1.1 (ref 0.8–1.2)
Prothrombin Time: 13.9 s (ref 11.4–15.2)

## 2023-10-13 LAB — SURGICAL PCR SCREEN
MRSA, PCR: NEGATIVE
Staphylococcus aureus: NEGATIVE

## 2023-10-13 MED ORDER — ALUM & MAG HYDROXIDE-SIMETH 200-200-20 MG/5ML PO SUSP
30.0000 mL | Freq: Four times a day (QID) | ORAL | Status: DC | PRN
Start: 2023-10-13 — End: 2023-10-17
  Administered 2023-10-13 – 2023-10-15 (×2): 30 mL via ORAL
  Filled 2023-10-13 (×2): qty 30

## 2023-10-13 MED ORDER — VANCOMYCIN HCL IN DEXTROSE 1-5 GM/200ML-% IV SOLN: 1000.0000 mg | INTRAVENOUS | Status: DC

## 2023-10-13 NOTE — Plan of Care (Signed)

## 2023-10-13 NOTE — Progress Notes (Signed)
      [address]       [place] [address]             [phone]       Subjective: No complaints this AM  Objective: Vital signs in last 24 hours: Temp:  [97.3 F (36.3 C)-98.6 F (37 C)] 97.3 F (36.3 C) (05/31 0834) Pulse Rate:  [50-62] 50 (05/31 0834) Resp:  [17-19] 17 (05/31 0834) BP: (104-121)/(54-62) 121/55 (05/31 0834) SpO2:  [93 %-100 %] 100 % (05/31 0834)  Hemodynamic parameters for last 24 hours:    Intake/Output from previous day: 05/30 0701 - 05/31 0700 In: 3 [I.V.:3] Out: -  Intake/Output this shift: No intake/output data recorded.  General appearance: alert, cooperative, and no distress Neurologic: intact Lungs: diminished breath sounds right base  Lab Results: No results for input(s): "WBC", "HGB", "HCT", "PLT" in the last 72 hours. BMET:  Recent Labs    10/11/23 1617  NA 130*  K 3.6  CL 95*  CO2 24  GLUCOSE 363*  BUN 11  CREATININE 1.00  CALCIUM 8.3*    PT/INR: No results for input(s): "LABPROT", "INR" in the last 72 hours. ABG    Component Value Date/Time   TCO2 31 09/21/2023 0739   CBG (last 3)  Recent Labs    10/12/23 2227 10/13/23 0252 10/13/23 0611  GLUCAP 182* 101* 74    Assessment/Plan: Complex right pleural effusion with complete RLL atelectasis Joel Jones has discussed with his family and Dr. Diania Fortes and has decided to proceed with right VATS for drainage of the pleural effusion and decortication. He is aware of the risks and benefits Will try to do tomorrow AM   LOS: 4 days    Joel Jones 10/13/2023

## 2023-10-13 NOTE — Progress Notes (Signed)
 PROGRESS NOTE  Joel Stogdill Dungee Sr. BJY:[id number] DOB: [birth year] DOA: 10/08/2023 PCP: [name], MD   LOS: 4 days   Brief narrative:  Joel Patches Siragusa Sr. is a 78 y.o. male with past medical history significant of hypertension, hyperlipidemia, diabetes, GERD, CAD, atrial fibrillation, bradycardia, obesity, RA, asthma to hospital with recurrent shortness of breath and right-sided chest pain.  Of note, patient had initially presented to [facility] and underwent cholecystectomy on 08/29/2023 and had a follow-up CT scan which showed fluid collection.  Patient presented on 5/9 with continued abdominal pain and was noted to have gastric outlet obstruction with concern for abscess.  He was then admitted and was seen by general surgery and interventional radiology and had IR placed drain which showed Enterococcus and Enterobacter species and had received IV Zosyn .  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  Drain was also reportedly repositioned on 5/23.  Patient has had 2 thoracocentesis for this symptoms.  At this time, patient presented with shortness of breath worse on exertion with right-sided chest pain.  In the ED, patient was notable for hyponatremia with sodium of 131.  Leukocytosis at 12.9.  Case was discussed with interventional radiology and was admitted to hospital for further evaluation and treatment.      Assessment/Plan: Principal Problem:   Recurrent pleural effusion on right Active Problems:   CAD (coronary artery disease)   Essential hypertension   Rheumatoid arthritis involving multiple sites with positive rheumatoid factor (HCC)   Gastroesophageal reflux disease   Type 2 diabetes mellitus with other specified complication (HCC)   Obesity   Asthmatic bronchitis , chronic (HCC)   Atrial fibrillation (HCC)   Hyperlipidemia associated with type 2 diabetes mellitus (HCC)   Recurrent  right pleural effusion Status post cholecystectomy History of intra-abdominal abscess status post drain placement History of recurrent right-sided pleural effusion.  History of drain placement for abdominal abscess following cholecystectomy.  IR on board at this time.  Previous perihepatic drain has been removed and new drain tube has been placed at the [facility] site with good drainage at this time.  CT scan of the chest abdomen pelvis done on 10/09/2023 showed interval increase in size of loculated at least small volume right pleural effusion with hilar and mediastinal lymphadenopathy.  Stable to slightly increased size of gallbladder fossa collection of free fluid and gas.   Patient underwent drain injection to discern any fistulous communication without any findings of fistulous communication..  Culture from previous pleural fluid tapping on 5/21 with no growth.    Eliquis  on hold for potential need for procedure.    Pulmonary was consulted due to loculated effusion and at this time recommendation is CT surgery evaluation.  CT surgery has seen the patient and recommended VATS.  Likely tomorrow.  IR has seen the patient and recommended follow-up in IR clinic in 1 to 2 weeks.  Hypertension - Continue home amlodipine  and ramipril .  Blood pressure seems to be stable.   Hyperlipidemia Continue atorvastatin   Diabetes melitis type I. On insulin  pump at home.  Had hyperglycemia so pump was removed and was started on long-acting and sliding scale insulin .  Blood glucose levels better this morning.  Diabetic coordinator on board.  Might need new pump on discharge.   GERD - Continue PPI   CAD - Continue home ramipril , Eliquis    Atrial fibrillation - Holding Eliquis  for now  Rheumatoid arthritis - On abatacept  outpatient.   Asthma - Continue home as needed albuterol .  Currently compensated.  Left ankle pain   seen by orthopedics.  Has ankle splint in place at this time.  X-ray shows degenerative  tibiotalar spurring.  DVT prophylaxis: Place and maintain sequential compression device Start: 10/08/23 1819   Disposition: Likely home with home health in 2 to 3 days.  Status is: Inpatient  The patient is inpatient because recurrent pleural effusion, plan for VATS procedure 6125.    Code Status:     Code Status: Full Code  Family Communication: Spoke with the patient's daughter-in-law at bedside on 10/12/2023, spoke with the son 10/13/2023.  Consultants: Interventional radiology General Surgery PCCM CT surgery   Procedures: Left ankle splint Abdominal drain placement 10/11/2023  Anti-infectives:  None yet  Anti-infectives (From admission, onward)    None       Subjective: Today, patient was seen and examined at bedside.  Patient denies overt pain, nausea, vomiting, fever, chills or rigors.  Denies any dyspnea, cough or chest pain.   Objective: Vitals:   10/13/23 0454 10/13/23 0834  BP: (!) 113/56 (!) 121/55  Pulse: (!) 53 (!) 50  Resp: 18 17  Temp: 98.1 F (36.7 C) (!) 97.3 F (36.3 C)  SpO2: 93% 100%    Intake/Output Summary (Last 24 hours) at 10/13/2023 0924 Last data filed at 10/12/2023 2108 Gross per 24 hour  Intake 3 ml  Output --  Net 3 ml   Filed Weights   10/08/23 1249  Weight: 81.6 kg   Body mass index is 29.05 kg/m.   Physical Exam:  GENERAL: Patient is alert awake and oriented. Not in obvious distress.  Alert awake and oriented.  On nasal cannula oxygen.  Communicative.  Deconditioned.   HENT: No scleral pallor or icterus. Pupils equally reactive to light. Oral mucosa is moist NECK: is supple, no gross swelling noted. CHEST: Decreased respirations bilaterally.  No obvious wheezes noted CVS: S1 and S2 heard, no murmur. Regular rate and rhythm.  ABDOMEN: Soft, non-tender, bowel sounds are present.  Abdominal drain in place with dressing with active drainage EXTREMITIES: Left ankle with splint. CNS: Cranial nerves are intact. No focal  motor deficits. SKIN: warm and dry without rashes.  Data Review: I have personally reviewed the following laboratory data and studies,  CBC: Recent Labs  Lab 10/08/23 1306 10/09/23 0422 10/10/23 0602  WBC 12.9* 11.0* 8.6  HGB 11.4* 10.5* 9.9*  HCT 32.9* 30.9* 29.4*  MCV 86.1 85.1 87.0  PLT 405* 389 408*   Basic Metabolic Panel: Recent Labs  Lab 10/08/23 1306 10/09/23 0422 10/10/23 0602 10/11/23 1617  NA 131* 130* 129* 130*  K 3.7 3.6 3.1* 3.6  CL 95* 94* 95* 95*  CO2 25 25 25 24   GLUCOSE 113* 107* 86 363*  BUN 8 10 9 11   CREATININE 0.97 0.93 0.84 1.00  CALCIUM 8.3* 8.2* 7.7* 8.3*  MG  --   --  1.9  --    Liver Function Tests: Recent Labs  Lab 10/09/23 0422 10/10/23 0602  AST 22 18  ALT 19 17  ALKPHOS 93 79  BILITOT 0.5 0.6  PROT 5.5* 5.2*  ALBUMIN 1.9* 1.8*   No results for input(s): "LIPASE", "AMYLASE" in the last 168 hours.  No results for input(s): "AMMONIA" in the last 168 hours. Cardiac Enzymes: No results for input(s): "CKTOTAL", "CKMB", "CKMBINDEX", "TROPONINI" in the last 168 hours. BNP (last 3 results) No results for input(s): "  BNP" in the last 8760 hours.  ProBNP (last 3 results) No results for input(s): "PROBNP" in the last 8760 hours.  CBG: Recent Labs  Lab 10/12/23 1601 10/12/23 1957 10/12/23 2227 10/13/23 0252 10/13/23 0611  GLUCAP 264* 160* 182* 101* 74   Recent Results (from the past 240 hours)  Body fluid culture w Gram Stain     Status: None   Collection Time: 10/03/23  2:23 PM   Specimen: Lung, Right; Pleural Fluid  Result Value Ref Range Status   Specimen Description PLEURAL  Final   Special Requests RIGHT LUNG  Final   Gram Stain   Final    FEW WBC PRESENT,BOTH PMN AND MONONUCLEAR NO ORGANISMS SEEN    Culture   Final    NO GROWTH 3 DAYS Performed at [facility], [address] [place], [place] [address]    Report Status 10/06/2023 FINAL  Final     Studies: IR Guided Drain W Catheter Placement Result  Date: 10/12/2023 INDICATION: 78 year old male status post cholecystectomy complicated postoperatively by biloma formation in the gallbladder fossa status post percutaneous drain placement on 09/22/2023 complicated by trans pleural drain placement resulting in bilious pleural effusion, now status post drain revision on 10/05/2023. Recent CT evidence of malpositioning of newly placed drain with recurrence of biloma. EXAM: Ultrasound and fluoroscopic guided biloma drain placement MEDICATIONS: The patient is currently admitted to the hospital and receiving intravenous antibiotics. The antibiotics were administered within an appropriate time frame prior to the initiation of the procedure. ANESTHESIA/SEDATION: None. COMPLICATIONS: None immediate. PROCEDURE: Informed written consent was obtained from the patient after a thorough discussion of the procedural risks, benefits and alternatives. All questions were addressed. Maximal Sterile Barrier Technique was utilized including caps, mask, sterile gowns, sterile gloves, sterile drape, hand hygiene and skin antiseptic. A timeout was performed prior to the initiation of the procedure. Preprocedure ultrasound evaluation of the right upper quadrant demonstrated an anechoic fluid collection in the gallbladder fossa. Just lateral to this fluid collection the indwelling drain was visualized, not within the fluid collection. Subdermal Local anesthesia was administered at the planned needle entry site. A small skin nick was made. Deeper local anesthetic was administered under ultrasound visualization to the periphery of the biloma. Next, an 8 [place] mini loop skater drain was introduced by trocar fashion into the biloma. The pigtail loop was formed within the biloma. Approximately 30 cc of bilious fluid was aspirated. A contrast injection demonstrated position within the biloma, no evidence of communication with the common bile duct or intrahepatic biliary trees. No evidence of  pleural fistulization. The drain was affixed to the skin with an interrupted 0 silk suture. The previously indwelling drainage catheter was removed without complication. The drain was placed to bag drainage. Sterile bandage was applied. The patient tolerated the procedure well was transferred back to the floor in good condition. IMPRESSION: Technically successful ultrasound and fluoroscopic guided placement of 8 French mini loop biloma drainage catheter. Successful removal of previously placed however malpositioned right upper quadrant catheter. Joel Doctor, MD [facility] Electronically Signed   By: Joel Jones M.D.   On: 10/12/2023 13:56      Joel Conradi, MD  [facility] 10/13/2023  If 7PM-7AM, please contact night-coverage

## 2023-10-14 ENCOUNTER — Encounter (HOSPITAL_COMMUNITY): Payer: Self-pay | Admitting: Internal Medicine

## 2023-10-14 ENCOUNTER — Encounter (HOSPITAL_COMMUNITY)
Admission: EM | Disposition: A | Discharge status: Home / Self Care | Payer: Self-pay | Source: Home / Self Care | Attending: Internal Medicine

## 2023-10-14 ENCOUNTER — Inpatient Hospital Stay (HOSPITAL_COMMUNITY): Admitting: Certified Registered Nurse Anesthetist

## 2023-10-14 ENCOUNTER — Inpatient Hospital Stay (HOSPITAL_COMMUNITY)

## 2023-10-14 DIAGNOSIS — E119 Type 2 diabetes mellitus without complications: Secondary | ICD-10-CM | POA: Diagnosis not present

## 2023-10-14 DIAGNOSIS — J90 Pleural effusion, not elsewhere classified: Secondary | ICD-10-CM

## 2023-10-14 DIAGNOSIS — I10 Essential (primary) hypertension: Secondary | ICD-10-CM | POA: Diagnosis not present

## 2023-10-14 DIAGNOSIS — I2510 Atherosclerotic heart disease of native coronary artery without angina pectoris: Secondary | ICD-10-CM

## 2023-10-14 HISTORY — PX: VIDEO ASSISTED THORACOSCOPY (VATS)/DECORTICATION: SHX6171

## 2023-10-14 LAB — CBC
HCT: 30.5 % — ABNORMAL LOW (ref 39.0–52.0)
Hemoglobin: 10.3 g/dL — ABNORMAL LOW (ref 13.0–17.0)
MCH: 29 pg (ref 26.0–34.0)
MCHC: 33.8 g/dL (ref 30.0–36.0)
MCV: 85.9 fL (ref 80.0–100.0)
Platelets: 490 10*3/uL — ABNORMAL HIGH (ref 150–400)
RBC: 3.55 MIL/uL — ABNORMAL LOW (ref 4.22–5.81)
RDW: 12.9 % (ref 11.5–15.5)
WBC: 7.2 10*3/uL (ref 4.0–10.5)
nRBC: 0 % (ref 0.0–0.2)

## 2023-10-14 LAB — BASIC METABOLIC PANEL WITH GFR
Anion gap: 10 (ref 5–15)
BUN: 10 mg/dL (ref 8–23)
CO2: 28 mmol/L (ref 22–32)
Calcium: 8 mg/dL — ABNORMAL LOW (ref 8.9–10.3)
Chloride: 95 mmol/L — ABNORMAL LOW (ref 98–111)
Creatinine, Ser: 0.8 mg/dL (ref 0.61–1.24)
GFR, Estimated: >60 mL/min (ref >60)
Glucose, Bld: 130 mg/dL — ABNORMAL HIGH (ref 70–99)
Potassium: 3.3 mmol/L — ABNORMAL LOW (ref 3.5–5.1)
Sodium: 133 mmol/L — ABNORMAL LOW (ref 135–145)

## 2023-10-14 LAB — GLUCOSE, CAPILLARY
Glucose-Capillary: 171 mg/dL — ABNORMAL HIGH (ref 70–99)
Glucose-Capillary: 62 mg/dL — ABNORMAL LOW (ref 70–99)
Glucose-Capillary: 204 mg/dL — ABNORMAL HIGH (ref 70–99)
Glucose-Capillary: 331 mg/dL — ABNORMAL HIGH (ref 70–99)
Glucose-Capillary: 368 mg/dL — ABNORMAL HIGH (ref 70–99)
Glucose-Capillary: 443 mg/dL — ABNORMAL HIGH (ref 70–99)

## 2023-10-14 LAB — MAGNESIUM: Magnesium: 1.9 mg/dL (ref 1.7–2.4)

## 2023-10-14 SURGERY — VIDEO ASSISTED THORACOSCOPY (VATS)/DECORTICATION
Anesthesia: General | Site: Chest | Laterality: Right

## 2023-10-14 MED ORDER — CHLORHEXIDINE GLUCONATE 0.12 % MT SOLN
OROMUCOSAL | Status: AC
  Filled 2023-10-14: qty 15

## 2023-10-14 MED ORDER — METHOCARBAMOL 500 MG PO TABS
500.0000 mg | ORAL_TABLET | Freq: Four times a day (QID) | ORAL | Status: DC | PRN
  Administered 2023-10-14 – 2023-10-17 (×2): 500 mg via ORAL
  Filled 2023-10-14 (×2): qty 1

## 2023-10-14 MED ORDER — DEXTROSE 50 % IV SOLN
25.0000 mL | Freq: Once | INTRAVENOUS | Status: AC
  Administered 2023-10-14: 25 mL via INTRAVENOUS

## 2023-10-14 MED ORDER — GABAPENTIN 300 MG PO CAPS
300.0000 mg | ORAL_CAPSULE | Freq: Every day | ORAL | Status: DC
  Administered 2023-10-16: 300 mg via ORAL
  Filled 2023-10-14 (×2): qty 1

## 2023-10-14 MED ORDER — PROPOFOL 10 MG/ML IV BOLUS
INTRAVENOUS | Status: DC | PRN
  Administered 2023-10-14: 130 mg via INTRAVENOUS

## 2023-10-14 MED ORDER — GABAPENTIN 300 MG PO CAPS: 300.0000 mg | ORAL_CAPSULE | Freq: Two times a day (BID) | ORAL | Status: DC

## 2023-10-14 MED ORDER — BUPIVACAINE HCL 0.5 % IJ SOLN
INTRAMUSCULAR | Status: AC
  Filled 2023-10-14: qty 1

## 2023-10-14 MED ORDER — HYDROMORPHONE HCL 1 MG/ML IJ SOLN
0.2500 mg | INTRAMUSCULAR | Status: DC | PRN
  Administered 2023-10-14 (×4): 0.5 mg via INTRAVENOUS

## 2023-10-14 MED ORDER — INSULIN ASPART 100 UNIT/ML IJ SOLN: 0.0000 [IU] | INTRAMUSCULAR | Status: DC | PRN

## 2023-10-14 MED ORDER — SENNOSIDES-DOCUSATE SODIUM 8.6-50 MG PO TABS
1.0000 | ORAL_TABLET | Freq: Every day | ORAL | Status: DC
  Administered 2023-10-14: 1 via ORAL
  Filled 2023-10-14 (×3): qty 1

## 2023-10-14 MED ORDER — ONDANSETRON HCL 4 MG/2ML IJ SOLN
INTRAMUSCULAR | Status: AC
  Filled 2023-10-14: qty 2

## 2023-10-14 MED ORDER — ENOXAPARIN SODIUM 40 MG/0.4ML IJ SOSY
40.0000 mg | PREFILLED_SYRINGE | Freq: Every day | INTRAMUSCULAR | Status: DC
  Administered 2023-10-14 – 2023-10-17 (×4): 40 mg via SUBCUTANEOUS
  Filled 2023-10-14 (×4): qty 0.4

## 2023-10-14 MED ORDER — PHENYLEPHRINE HCL-NACL 20-0.9 MG/250ML-% IV SOLN
INTRAVENOUS | Status: DC | PRN
  Administered 2023-10-14: 50 ug/min via INTRAVENOUS

## 2023-10-14 MED ORDER — PHENYLEPHRINE 80 MCG/ML (10ML) SYRINGE FOR IV PUSH (FOR BLOOD PRESSURE SUPPORT)
PREFILLED_SYRINGE | INTRAVENOUS | Status: DC | PRN
  Administered 2023-10-14: 80 ug via INTRAVENOUS

## 2023-10-14 MED ORDER — KETOROLAC TROMETHAMINE 15 MG/ML IJ SOLN
15.0000 mg | Freq: Four times a day (QID) | INTRAMUSCULAR | Status: AC
  Administered 2023-10-14 – 2023-10-16 (×8): 15 mg via INTRAVENOUS
  Filled 2023-10-14 (×8): qty 1

## 2023-10-14 MED ORDER — ACETAMINOPHEN 160 MG/5ML PO SOLN
1000.0000 mg | Freq: Four times a day (QID) | ORAL | Status: DC
Start: 2023-10-14 — End: 2023-10-19

## 2023-10-14 MED ORDER — DROPERIDOL 2.5 MG/ML IJ SOLN: 0.6250 mg | Freq: Once | INTRAMUSCULAR | Status: DC | PRN

## 2023-10-14 MED ORDER — RAMIPRIL 5 MG PO CAPS
5.0000 mg | ORAL_CAPSULE | Freq: Every day | ORAL | Status: DC
  Filled 2023-10-14: qty 1

## 2023-10-14 MED ORDER — FENTANYL CITRATE (PF) 250 MCG/5ML IJ SOLN
INTRAMUSCULAR | Status: DC | PRN
  Administered 2023-10-14 (×2): 50 ug via INTRAVENOUS
  Administered 2023-10-14: 100 ug via INTRAVENOUS

## 2023-10-14 MED ORDER — CHLORHEXIDINE GLUCONATE 0.12 % MT SOLN
15.0000 mL | Freq: Once | OROMUCOSAL | Status: AC
  Administered 2023-10-14: 15 mL via OROMUCOSAL

## 2023-10-14 MED ORDER — CEFAZOLIN SODIUM-DEXTROSE 2-4 GM/100ML-% IV SOLN
2.0000 g | Freq: Three times a day (TID) | INTRAVENOUS | Status: AC
  Administered 2023-10-14 – 2023-10-15 (×2): 2 g via INTRAVENOUS
  Filled 2023-10-14 (×2): qty 100

## 2023-10-14 MED ORDER — INSULIN ASPART 100 UNIT/ML IJ SOLN
0.0000 [IU] | Freq: Three times a day (TID) | INTRAMUSCULAR | Status: DC
  Administered 2023-10-15: 3 [IU] via SUBCUTANEOUS

## 2023-10-14 MED ORDER — DEXTROSE 50 % IV SOLN
INTRAVENOUS | Status: AC
Start: 2023-10-14 — End: ?
  Filled 2023-10-14: qty 50

## 2023-10-14 MED ORDER — HYDROMORPHONE HCL 1 MG/ML IJ SOLN
INTRAMUSCULAR | Status: AC
  Filled 2023-10-14: qty 1

## 2023-10-14 MED ORDER — LIDOCAINE 2% (20 MG/ML) 5 ML SYRINGE
INTRAMUSCULAR | Status: DC | PRN
  Administered 2023-10-14: 40 mg via INTRAVENOUS

## 2023-10-14 MED ORDER — 0.9 % SODIUM CHLORIDE (POUR BTL) OPTIME
TOPICAL | Status: DC | PRN
  Administered 2023-10-14: 2000 mL

## 2023-10-14 MED ORDER — KETOROLAC TROMETHAMINE 30 MG/ML IJ SOLN
INTRAMUSCULAR | Status: DC | PRN
Start: 2023-10-14 — End: 2023-10-14
  Administered 2023-10-14: 15 mg via INTRAVENOUS

## 2023-10-14 MED ORDER — LACTATED RINGERS IV SOLN: INTRAVENOUS | Status: DC

## 2023-10-14 MED ORDER — ONDANSETRON HCL 4 MG/2ML IJ SOLN
INTRAMUSCULAR | Status: DC | PRN
  Administered 2023-10-14: 4 mg via INTRAVENOUS

## 2023-10-14 MED ORDER — DEXAMETHASONE SODIUM PHOSPHATE 10 MG/ML IJ SOLN
INTRAMUSCULAR | Status: DC | PRN
  Administered 2023-10-14: 10 mg via INTRAVENOUS

## 2023-10-14 MED ORDER — ACETAMINOPHEN 10 MG/ML IV SOLN
1000.0000 mg | Freq: Once | INTRAVENOUS | Status: DC | PRN
  Administered 2023-10-14: 1000 mg via INTRAVENOUS

## 2023-10-14 MED ORDER — ORAL CARE MOUTH RINSE: 15.0000 mL | Freq: Once | OROMUCOSAL | Status: AC

## 2023-10-14 MED ORDER — SUGAMMADEX SODIUM 200 MG/2ML IV SOLN
INTRAVENOUS | Status: DC | PRN
  Administered 2023-10-14: 179 mg via INTRAVENOUS

## 2023-10-14 MED ORDER — LIDOCAINE 2% (20 MG/ML) 5 ML SYRINGE
INTRAMUSCULAR | Status: AC
Start: 2023-10-14 — End: ?
  Filled 2023-10-14: qty 5

## 2023-10-14 MED ORDER — BUPIVACAINE HCL (PF) 0.5 % IJ SOLN
INTRAMUSCULAR | Status: AC
  Filled 2023-10-14: qty 30

## 2023-10-14 MED ORDER — MIDAZOLAM HCL 2 MG/2ML IJ SOLN
INTRAMUSCULAR | Status: AC
  Filled 2023-10-14: qty 2

## 2023-10-14 MED ORDER — VANCOMYCIN HCL 1000 MG IV SOLR
INTRAVENOUS | Status: DC | PRN
  Administered 2023-10-14: 1000 mg via INTRAVENOUS

## 2023-10-14 MED ORDER — MIDAZOLAM HCL 2 MG/2ML IJ SOLN
INTRAMUSCULAR | Status: DC | PRN
  Administered 2023-10-14: 1 mg via INTRAVENOUS

## 2023-10-14 MED ORDER — ACETAMINOPHEN 500 MG PO TABS
1000.0000 mg | ORAL_TABLET | Freq: Four times a day (QID) | ORAL | Status: DC
  Administered 2023-10-14 – 2023-10-17 (×4): 1000 mg via ORAL
  Filled 2023-10-14 (×7): qty 2

## 2023-10-14 MED ORDER — DEXAMETHASONE SODIUM PHOSPHATE 10 MG/ML IJ SOLN
INTRAMUSCULAR | Status: AC
  Filled 2023-10-14: qty 1

## 2023-10-14 MED ORDER — ROCURONIUM BROMIDE 10 MG/ML (PF) SYRINGE
PREFILLED_SYRINGE | INTRAVENOUS | Status: DC | PRN
  Administered 2023-10-14: 50 mg via INTRAVENOUS
  Administered 2023-10-14: 20 mg via INTRAVENOUS
  Administered 2023-10-14: 10 mg via INTRAVENOUS
  Administered 2023-10-14: 20 mg via INTRAVENOUS

## 2023-10-14 MED ORDER — ACETAMINOPHEN 10 MG/ML IV SOLN
INTRAVENOUS | Status: AC
  Filled 2023-10-14: qty 100

## 2023-10-14 MED ORDER — PROPOFOL 500 MG/50ML IV EMUL
INTRAVENOUS | Status: DC | PRN
  Administered 2023-10-14: 25 ug/kg/min via INTRAVENOUS

## 2023-10-14 MED ORDER — MEPERIDINE HCL 25 MG/ML IJ SOLN: 6.2500 mg | INTRAMUSCULAR | Status: DC | PRN

## 2023-10-14 MED ORDER — EPHEDRINE 5 MG/ML INJ
INTRAVENOUS | Status: AC
Start: 2023-10-14 — End: ?
  Filled 2023-10-14: qty 5

## 2023-10-14 MED ORDER — EPHEDRINE SULFATE-NACL 50-0.9 MG/10ML-% IV SOSY
PREFILLED_SYRINGE | INTRAVENOUS | Status: DC | PRN
  Administered 2023-10-14: 15 mg via INTRAVENOUS

## 2023-10-14 MED ORDER — FENTANYL CITRATE (PF) 250 MCG/5ML IJ SOLN
INTRAMUSCULAR | Status: AC
  Filled 2023-10-14: qty 5

## 2023-10-14 MED ORDER — VANCOMYCIN HCL IN DEXTROSE 1-5 GM/200ML-% IV SOLN
INTRAVENOUS | Status: AC
  Filled 2023-10-14: qty 200

## 2023-10-14 MED ORDER — BISACODYL 5 MG PO TBEC
10.0000 mg | DELAYED_RELEASE_TABLET | Freq: Every day | ORAL | Status: DC
  Administered 2023-10-15 – 2023-10-16 (×2): 10 mg via ORAL
  Filled 2023-10-14 (×3): qty 2

## 2023-10-14 SURGICAL SUPPLY — 67 items
APPLICATOR COTTON TIP 6 STRL (MISCELLANEOUS) IMPLANT
APPLICATOR COTTON TIP 6IN STRL (MISCELLANEOUS) ×1 IMPLANT
BLADE CLIPPER SURG (BLADE) ×1 IMPLANT
CANISTER SUCTION 3000ML PPV (SUCTIONS) ×1 IMPLANT
CATH THORACIC 28FR (CATHETERS) IMPLANT
CATH THORACIC 28FR RT ANG (CATHETERS) IMPLANT
CATH THORACIC 36FR (CATHETERS) IMPLANT
CATH THORACIC 36FR RT ANG (CATHETERS) IMPLANT
CNTNR URN SCR LID CUP LEK RST (MISCELLANEOUS) ×2 IMPLANT
CONNECTOR BLAKE 2:1 CARIO BLK (MISCELLANEOUS) IMPLANT
COVER SURGICAL LIGHT HANDLE (MISCELLANEOUS) ×1 IMPLANT
DERMABOND ADVANCED .7 DNX12 (GAUZE/BANDAGES/DRESSINGS) IMPLANT
DRAIN CHANNEL 28F RND 3/8 FF (WOUND CARE) IMPLANT
DRAPE CV SPLIT W-CLR ANES SCRN (DRAPES) ×1 IMPLANT
DRAPE SURG ORHT 6 SPLT 77X108 (DRAPES) ×1 IMPLANT
DRAPE WARM FLUID 44X44 (DRAPES) ×1 IMPLANT
ELECT BLADE 6.5 EXT (BLADE) ×1 IMPLANT
ELECTRODE REM PT RTRN 9FT ADLT (ELECTROSURGICAL) ×1 IMPLANT
GAUZE 4X4 16PLY ~~LOC~~+RFID DBL (SPONGE) ×1 IMPLANT
GAUZE SPONGE 4X4 12PLY STRL (GAUZE/BANDAGES/DRESSINGS) ×1 IMPLANT
GLOVE SS BIOGEL STRL SZ 7.5 (GLOVE) ×1 IMPLANT
GLOVE SURG SIGNA 7.5 PF LTX (GLOVE) ×2 IMPLANT
GOWN STRL REUS W/ TWL LRG LVL3 (GOWN DISPOSABLE) ×2 IMPLANT
GOWN STRL REUS W/ TWL XL LVL3 (GOWN DISPOSABLE) ×1 IMPLANT
KIT BASIN OR (CUSTOM PROCEDURE TRAY) ×1 IMPLANT
KIT TURNOVER KIT B (KITS) ×1 IMPLANT
NDL HYPO 25GX1X1/2 BEV (NEEDLE) ×1 IMPLANT
NDL SPNL 18GX3.5 QUINCKE PK (NEEDLE) IMPLANT
NDL SPNL 22GX3.5 QUINCKE BK (NEEDLE) ×1 IMPLANT
NEEDLE HYPO 25GX1X1/2 BEV (NEEDLE) ×1 IMPLANT
NEEDLE SPNL 18GX3.5 QUINCKE PK (NEEDLE) IMPLANT
NEEDLE SPNL 22GX3.5 QUINCKE BK (NEEDLE) ×1 IMPLANT
NS IRRIG 1000ML POUR BTL (IV SOLUTION) ×3 IMPLANT
PACK CHEST (CUSTOM PROCEDURE TRAY) ×1 IMPLANT
PAD ARMBOARD POSITIONER FOAM (MISCELLANEOUS) ×2 IMPLANT
RELOAD EGIA 60 MED/THCK PURPLE (STAPLE) ×2 IMPLANT
RELOAD STAPLE 60 MED/THCK ART (STAPLE) IMPLANT
SCISSORS LAP 5X35 DISP (ENDOMECHANICALS) IMPLANT
SEALANT PROGEL (MISCELLANEOUS) IMPLANT
SEALANT SURG COSEAL 8ML (VASCULAR PRODUCTS) IMPLANT
SPONGE INTESTINAL PEANUT (DISPOSABLE) IMPLANT
SPONGE T-LAP 18X18 ~~LOC~~+RFID (SPONGE) ×4 IMPLANT
SPONGE T-LAP 4X18 ~~LOC~~+RFID (SPONGE) ×1 IMPLANT
SPONGE TONSIL 1 RF SGL (DISPOSABLE) ×1 IMPLANT
STAPLER ENDO GIA 12 SHRT THIN (STAPLE) IMPLANT
STAPLER ENDO GIA 12MM SHORT (STAPLE) ×1 IMPLANT
SUT PROLENE 4-0 RB1 .5 CRCL 36 (SUTURE) IMPLANT
SUT SILK 1 MH (SUTURE) ×2 IMPLANT
SUT SILK 1 TIES 10X30 (SUTURE) ×1 IMPLANT
SUT SILK 2 0SH CR/8 30 (SUTURE) IMPLANT
SUT SILK 3 0SH CR/8 30 (SUTURE) IMPLANT
SUT VIC AB 1 CTX36XBRD ANBCTR (SUTURE) IMPLANT
SUT VIC AB 2-0 CTX 36 (SUTURE) IMPLANT
SUT VIC AB 3-0 MH 27 (SUTURE) IMPLANT
SUT VIC AB 3-0 X1 27 (SUTURE) IMPLANT
SYR 10ML LL (SYRINGE) IMPLANT
SYR 30ML LL (SYRINGE) ×1 IMPLANT
SYSTEM BAG RETRIEVAL 10MM (BASKET) IMPLANT
SYSTEM SAHARA CHEST DRAIN ATS (WOUND CARE) ×1 IMPLANT
TAPE CLOTH 4X10 WHT NS (GAUZE/BANDAGES/DRESSINGS) ×1 IMPLANT
TOWEL GREEN STERILE (TOWEL DISPOSABLE) ×1 IMPLANT
TOWEL GREEN STERILE FF (TOWEL DISPOSABLE) ×1 IMPLANT
TRAP SPECIMEN MUCUS 40CC (MISCELLANEOUS) IMPLANT
TRAY FOLEY MTR SLVR 16FR STAT (SET/KITS/TRAYS/PACK) ×1 IMPLANT
TROCAR XCEL BLADELESS 5X75MML (TROCAR) ×1 IMPLANT
TROCAR Z-THREAD FIOS 5X100MM (TROCAR) IMPLANT
WATER STERILE IRR 1000ML POUR (IV SOLUTION) ×1 IMPLANT

## 2023-10-14 NOTE — Interval H&P Note (Signed)
 History and Physical Interval Note:  10/14/2023 7:15 AM  Joel Patches Deatley Sr.  has presented today for surgery, with the diagnosis of Drainage Right Pleural Effusion.  The various methods of treatment have been discussed with the patient and family. After consideration of risks, benefits and other options for treatment, the patient has consented to  Procedure(s) with comments: VIDEO ASSISTED THORACOSCOPY (VATS)/DECORTICATION (Right) - DRAINAGE OF PLEURAL EFFUSION as a surgical intervention.  The patient's history has been reviewed, patient examined, no change in status, stable for surgery.  I have reviewed the patient's chart and labs.  Questions were answered to the patient's satisfaction.     Zelphia Higashi

## 2023-10-14 NOTE — Op Note (Signed)
 NAME: GODWIN, TEDESCO MEDICAL RECORD NO: [id number] ACCOUNT NO: [id number] DATE OF BIRTH: 08-09-1945 FACILITY: [facility] LOCATION: [facility] PHYSICIAN: Milon Aloe. Luna Salinas, MD  Operative Report   DATE OF PROCEDURE: 10/14/2023  PREOPERATIVE DIAGNOSIS:  Loculated right pleural effusion.  POSTOPERATIVE DIAGNOSIS:  Loculated right pleural effusion.  PROCEDURE:   Right video-assisted thoracoscopy,  Drainage of pleural effusion,  Visceral and parietal pleural decortication.  SURGEON:  Milon Aloe. Luna Salinas, MD.  ASSISTANT:  Debroah Fanning, PA.   Experienced assistance was necessary for this case due to surgical complexity.  Debroah Fanning assisted with retraction, camera management, suctioning, and wound closure.  ANESTHESIA:  General.  FINDINGS:  Several small pockets of loculated fluid, extensive inflammatory peel, and complex gelatinous exudate in the pleural space.  Good re-expansion of all 3 lobes post decortication.  CLINICAL NOTE:  Mr. Oros is a 78 year old gentleman who had undergone a robotic cholecystectomy.  He had a complicated course with an abscess/bile leak and had a drain placed.  He developed a right pleural effusion.  He has had thoracentesis but had residual effusion, which was relatively small in volume, but had complete atelectasis of the right lower lobe.  He was advised to undergo surgical drainage and decortication.  The indications, risks, benefits, and alternatives were discussed in detail with the patient.  He understood and accepted the risks and agreed to proceed.  OPERATIVE NOTE:  Mr. Dattilio was brought to the operating room on 10/14/2023.  He had induction of general anesthesia and was intubated with a double-lumen endotracheal tube.  Intravenous antibiotics were administered.  A Foley catheter was placed.  Sequential compression devices were placed on the calves for DVT prophylaxis.  He was placed in a left lateral decubitus position.  A Bair Hugger was placed for active  warming.  The right chest was prepped and draped in the usual sterile fashion.  A timeout was performed.  An incision was made in the 5th interspace anterolaterally.  This was approximately 4 cm in length.  No rib spreading was performed during the procedure.  On entry into the chest, there were some adhesions of the lung to the chest wall.  These were taken down using fingertip dissection.  A space was created and a 5-mm port was placed through a separate incision in the 7th interspace.  The camera was inserted through that incision.  There were adhesions of the lung to the chest wall as well as in  the fissure.  The fissure was opened first and the fluid was evacuated.  As the lung was being taken down off the chest wall, there was a small tear in the right upper lobe.  This area was stapled.  The lung was freed up circumferentially at the apex  and then anteriorly, again encountering multiple loculated pockets of fluid.  The upper lobe and superior portion of the middle lobe were relatively spared.  The lower lobe was encased in a dense pleural peel.  The lung was dissected off the pleural peel as much as possible, but eventually a plane was entered between the chest wall and the peel and the parietal decortication was initiated.  After the lung had been freed up along the diaphragm and posteriorly at the base, the visceral decortication was completed.  The peel was rather thick in some areas, particularly posterolaterally, but did come off the lung with relative ease.  There was some bleeding associated with that and significant bleeding associated with the parietal decortication.  The decortication was a  slow and tedious process, but once the lung had been adequately decorticated, a test inflation showed good re-expansion of all 3 lobes of the lung.  The parietal decortication then was completed.  There were areas of thick exudate  Posteriorly.  Fluid was sent for cytology and culture and the tissue was  also sent for culture and pathology.  The chest was copiously irrigated with multiple liters of warm saline.  A second incision had been made posterolaterally for retraction purposes along the diaphragm as the lung was being taken off the diaphragm.  A third port-type incision was made.  Two 28-French Blake drains were placed, one posteriorly and one anterolaterally.  Both were directed to the apex.  Both were secured at the skin with #1 silk sutures.  Dual-lung ventilation was resumed.  The posterior incision was closed with a 3-0 Vicryl subcuticular suture.  The working incision was closed in standard fashion.  Dermabond was applied.  The chest tubes were placed to a  Pleur-Evac on suction.  The patient was extubated in the operating room and taken to the post-anesthesia care unit in good condition.  All sponge, needle, and instrument counts were correct at the end of the procedure.   PUS D: 10/14/2023 12:14:34 pm T: 10/14/2023 1:41:00 pm  JOB: [id number]/ [id number]

## 2023-10-14 NOTE — Plan of Care (Signed)
  Problem: Education: Goal: Knowledge of General Education information will improve Description: Including pain rating scale, medication(s)/side effects and non-pharmacologic comfort measures Outcome: Progressing   Problem: Health Behavior/Discharge Planning: Goal: Ability to manage health-related needs will improve Outcome: Progressing   Problem: Clinical Measurements: Goal: Ability to maintain clinical measurements within normal limits will improve Outcome: Progressing Goal: Will remain free from infection Outcome: Progressing Goal: Diagnostic test results will improve Outcome: Progressing Goal: Respiratory complications will improve Outcome: Progressing Goal: Cardiovascular complication will be avoided Outcome: Progressing   Problem: Activity: Goal: Risk for activity intolerance will decrease Outcome: Progressing   Problem: Nutrition: Goal: Adequate nutrition will be maintained Outcome: Progressing   Problem: Coping: Goal: Level of anxiety will decrease Outcome: Progressing   Problem: Elimination: Goal: Will not experience complications related to bowel motility Outcome: Progressing Goal: Will not experience complications related to urinary retention Outcome: Progressing   Problem: Pain Managment: Goal: General experience of comfort will improve and/or be controlled Outcome: Progressing   Problem: Safety: Goal: Ability to remain free from injury will improve Outcome: Progressing   Problem: Skin Integrity: Goal: Risk for impaired skin integrity will decrease Outcome: Progressing   Problem: Education: Goal: Ability to describe self-care measures that Behar prevent or decrease complications (Diabetes Survival Skills Education) will improve Outcome: Progressing Goal: Individualized Educational Video(s) Outcome: Progressing   Problem: Coping: Goal: Ability to adjust to condition or change in health will improve Outcome: Progressing   Problem: Fluid  Volume: Goal: Ability to maintain a balanced intake and output will improve Outcome: Progressing   Problem: Health Behavior/Discharge Planning: Goal: Ability to identify and utilize available resources and services will improve Outcome: Progressing Goal: Ability to manage health-related needs will improve Outcome: Progressing   Problem: Metabolic: Goal: Ability to maintain appropriate glucose levels will improve Outcome: Progressing   Problem: Nutritional: Goal: Maintenance of adequate nutrition will improve Outcome: Progressing Goal: Progress toward achieving an optimal weight will improve Outcome: Progressing   Problem: Skin Integrity: Goal: Risk for impaired skin integrity will decrease Outcome: Progressing   Problem: Tissue Perfusion: Goal: Adequacy of tissue perfusion will improve Outcome: Progressing   Problem: Education: Goal: Knowledge of disease or condition will improve Outcome: Progressing Goal: Knowledge of the prescribed therapeutic regimen will improve Outcome: Progressing   Problem: Activity: Goal: Risk for activity intolerance will decrease Outcome: Progressing   Problem: Cardiac: Goal: Will achieve and/or maintain hemodynamic stability Outcome: Progressing   Problem: Clinical Measurements: Goal: Postoperative complications will be avoided or minimized Outcome: Progressing   Problem: Respiratory: Goal: Respiratory status will improve Outcome: Progressing   Problem: Pain Management: Goal: Pain level will decrease Outcome: Progressing   Problem: Skin Integrity: Goal: Wound healing without signs and symptoms infection will improve Outcome: Progressing

## 2023-10-14 NOTE — Anesthesia Preprocedure Evaluation (Addendum)
 Anesthesia Evaluation  Patient identified by MRN, date of birth, ID band Patient awake    Reviewed: Allergy & Precautions, NPO status , Patient's Chart, lab work & pertinent test results  Airway Mallampati: I  TM Distance: >3 FB Neck ROM: Full    Dental  (+) Edentulous Upper, Edentulous Lower   Pulmonary asthma , former smoker    + decreased breath sounds      Cardiovascular hypertension, Pt. on medications + CAD   Rhythm:Regular Rate:Normal     Neuro/Psych  Headaches  Neuromuscular disease    GI/Hepatic Neg liver ROS, hiatal hernia,GERD  Medicated,,  Endo/Other  diabetes, Type 2, Insulin  Dependent    Renal/GU Renal disease     Musculoskeletal  (+) Arthritis ,    Abdominal   Peds  Hematology  (+) Blood dyscrasia, anemia   Anesthesia Other Findings   Reproductive/Obstetrics                             Anesthesia Physical Anesthesia Plan  ASA: 3  Anesthesia Plan: General   Post-op Pain Management: Ofirmev  IV (intra-op)* and Toradol  IV (intra-op)*   Induction: Intravenous  PONV Risk Score and Plan: 3 and Ondansetron , Dexamethasone  and Midazolam   Airway Management Planned: Double Lumen EBT  Additional Equipment: ClearSight  Intra-op Plan:   Post-operative Plan: Extubation in OR  Informed Consent: I have reviewed the patients History and Physical, chart, labs and discussed the procedure including the risks, benefits and alternatives for the proposed anesthesia with the patient or authorized representative who has indicated his/her understanding and acceptance.     Dental advisory given  Plan Discussed with: CRNA  Anesthesia Plan Comments: (- 2 Large bore IV's)       Anesthesia Quick Evaluation

## 2023-10-14 NOTE — Plan of Care (Signed)

## 2023-10-14 NOTE — Brief Op Note (Addendum)
 10/14/2023  10:32 AM  PATIENT:  Kelby Patches Sforza Sr.  78 y.o. male  PRE-OPERATIVE DIAGNOSIS:  Drainage Right Pleural Effusion  POST-OPERATIVE DIAGNOSIS:  Drainage Right Pleural Effusion  PROCEDURE:  RIGHT VIDEO ASSISTED THORACOSCOPY (VATS) DRAINAGE OF PLEURAL EFFUSION VISCERAL AND PARIETAL PLEURAL DECORTICATION (Right)  SURGEON:  Surgeons and Role:    * Zelphia Higashi, MD - Primary  PHYSICIAN ASSISTANT: Debroah Fanning PA-C  ASSISTANTS: none   ANESTHESIA:   general  EBL:  400 mL   BLOOD ADMINISTERED:none  DRAINS: 2 right pleural drains   LOCAL MEDICATIONS USED:  NONE  SPECIMEN:  Source of Specimen:  pleural peel, pleural fluid  DISPOSITION OF SPECIMEN:  pathology, culture  COUNTS:  YES  DICTATION: .-  PLAN OF CARE: Admit to inpatient   PATIENT DISPOSITION:  PACU - hemodynamically stable.   Delay start of Pharmacological VTE agent (>24hrs) due to surgical blood loss or risk of bleeding: no

## 2023-10-14 NOTE — Transfer of Care (Signed)
 Immediate Anesthesia Transfer of Care Note  Patient: Joel Ake Kos Sr.  Procedure(s) Performed: VIDEO ASSISTED THORACOSCOPY (VATS)/DECORTICATION (Right: Chest)  Patient Location: PACU  Anesthesia Type:General  Level of Consciousness: awake  Airway & Oxygen Therapy: Patient Spontanous Breathing  Post-op Assessment: Report given to RN and Post -op Vital signs reviewed and stable  Post vital signs: Reviewed and stable  Last Vitals:  Vitals Value Taken Time  BP 104/60 10/14/23 1032  Temp 98   Pulse 71 10/14/23 1042  Resp 20 10/14/23 1042  SpO2 97 % 10/14/23 1042  Vitals shown include unfiled device data.  Last Pain:  Vitals:   10/14/23 0704  TempSrc: Oral  PainSc:       Patients Stated Pain Goal: 4 (10/12/23 0745)  Complications: No notable events documented.

## 2023-10-14 NOTE — Anesthesia Postprocedure Evaluation (Signed)
 Anesthesia Post Note  Patient: Joel Ramson Baby Sr.  Procedure(s) Performed: VIDEO ASSISTED THORACOSCOPY (VATS)/DECORTICATION (Right: Chest)     Patient location during evaluation: PACU Anesthesia Type: General Level of consciousness: awake and alert Pain management: pain level controlled Vital Signs Assessment: post-procedure vital signs reviewed and stable Respiratory status: spontaneous breathing, nonlabored ventilation, respiratory function stable and patient connected to nasal cannula oxygen Cardiovascular status: blood pressure returned to baseline and stable Postop Assessment: no apparent nausea or vomiting Anesthetic complications: no  No notable events documented.  Last Vitals:  Vitals:   10/14/23 1500 10/14/23 1632  BP: 119/68   Pulse: 67 69  Resp: 17 (!) 22  Temp:  (!) 36.4 C  SpO2: 97% 90%    Last Pain:  Vitals:   10/14/23 1632  TempSrc: Oral  PainSc:                  [name] D Ruchel Brandenburger

## 2023-10-15 ENCOUNTER — Encounter (HOSPITAL_COMMUNITY): Payer: Self-pay | Admitting: Thoracic Surgery (Cardiothoracic Vascular Surgery)

## 2023-10-15 ENCOUNTER — Inpatient Hospital Stay (HOSPITAL_COMMUNITY)

## 2023-10-15 ENCOUNTER — Other Ambulatory Visit: Payer: Self-pay | Admitting: Student

## 2023-10-15 DIAGNOSIS — J90 Pleural effusion, not elsewhere classified: Secondary | ICD-10-CM | POA: Diagnosis not present

## 2023-10-15 LAB — BASIC METABOLIC PANEL WITH GFR
Anion gap: 11 (ref 5–15)
Anion gap: 12 (ref 5–15)
BUN: 23 mg/dL (ref 8–23)
BUN: 24 mg/dL — ABNORMAL HIGH (ref 8–23)
CO2: 25 mmol/L (ref 22–32)
CO2: 28 mmol/L (ref 22–32)
Calcium: 8.4 mg/dL — ABNORMAL LOW (ref 8.9–10.3)
Calcium: 8.6 mg/dL — ABNORMAL LOW (ref 8.9–10.3)
Chloride: 93 mmol/L — ABNORMAL LOW (ref 98–111)
Chloride: 91 mmol/L — ABNORMAL LOW (ref 98–111)
Creatinine, Ser: 1.35 mg/dL — ABNORMAL HIGH (ref 0.61–1.24)
Creatinine, Ser: 1.4 mg/dL — ABNORMAL HIGH (ref 0.61–1.24)
GFR, Estimated: 54 mL/min — ABNORMAL LOW (ref >60)
GFR, Estimated: 52 mL/min — ABNORMAL LOW (ref >60)
Glucose, Bld: 339 mg/dL — ABNORMAL HIGH (ref 70–99)
Glucose, Bld: 283 mg/dL — ABNORMAL HIGH (ref 70–99)
Potassium: 4.1 mmol/L (ref 3.5–5.1)
Potassium: 3.8 mmol/L (ref 3.5–5.1)
Sodium: 129 mmol/L — ABNORMAL LOW (ref 135–145)
Sodium: 131 mmol/L — ABNORMAL LOW (ref 135–145)

## 2023-10-15 LAB — CBC
HCT: 26.4 % — ABNORMAL LOW (ref 39.0–52.0)
Hemoglobin: 8.7 g/dL — ABNORMAL LOW (ref 13.0–17.0)
MCH: 28.7 pg (ref 26.0–34.0)
MCHC: 33 g/dL (ref 30.0–36.0)
MCV: 87.1 fL (ref 80.0–100.0)
Platelets: 475 10*3/uL — ABNORMAL HIGH (ref 150–400)
RBC: 3.03 MIL/uL — ABNORMAL LOW (ref 4.22–5.81)
RDW: 12.9 % (ref 11.5–15.5)
WBC: 9.7 10*3/uL (ref 4.0–10.5)
nRBC: 0 % (ref 0.0–0.2)

## 2023-10-15 LAB — GLUCOSE, CAPILLARY
Glucose-Capillary: 279 mg/dL — ABNORMAL HIGH (ref 70–99)
Glucose-Capillary: 382 mg/dL — ABNORMAL HIGH (ref 70–99)

## 2023-10-15 MED ORDER — INSULIN PUMP
Freq: Three times a day (TID) | SUBCUTANEOUS | Status: DC
  Administered 2023-10-15: 13 via SUBCUTANEOUS
  Administered 2023-10-15: 8.9 via SUBCUTANEOUS
  Administered 2023-10-16: 11 via SUBCUTANEOUS
  Filled 2023-10-15: qty 1

## 2023-10-15 MED ORDER — INSULIN GLARGINE-YFGN 100 UNIT/ML ~~LOC~~ SOLN
35.0000 [IU] | Freq: Every day | SUBCUTANEOUS | Status: DC
  Filled 2023-10-15: qty 0.35

## 2023-10-15 MED ORDER — PHENYLEPHRINE HCL-NACL 20-0.9 MG/250ML-% IV SOLN
INTRAVENOUS | Status: AC
  Filled 2023-10-15: qty 250

## 2023-10-15 MED ORDER — INSULIN ASPART 100 UNIT/ML IJ SOLN
4.0000 [IU] | Freq: Once | INTRAMUSCULAR | Status: AC
  Administered 2023-10-15: 4 [IU] via SUBCUTANEOUS

## 2023-10-15 MED ORDER — CHLORHEXIDINE GLUCONATE CLOTH 2 % EX PADS
6.0000 | MEDICATED_PAD | Freq: Every day | CUTANEOUS | Status: DC
  Administered 2023-10-14: 6 via TOPICAL

## 2023-10-15 NOTE — Progress Notes (Addendum)
      [address]       [place] [address]             [phone]       1 Day Post-Op Procedure(s) (LRB): VIDEO ASSISTED THORACOSCOPY (VATS)/DECORTICATION (Right)  Subjective: Patient with not much pain this am.  Objective: Vital signs in last 24 hours: Temp:  [97.4 F (36.3 C)-98.4 F (36.9 C)] 98.4 F (36.9 C) (06/02 0326) Pulse Rate:  [52-79] 52 (06/02 0326) Cardiac Rhythm: Normal sinus rhythm (06/01 1902) Resp:  [11-22] 16 (06/02 0326) BP: (98-120)/(52-76) 110/55 (06/02 0326) SpO2:  [92 %-98 %] 92 % (06/02 0326) Weight:  [83.1 kg] 83.1 kg (06/01 1441)     Intake/Output from previous day: 06/01 0701 - 06/02 0700 In: 2220 [P.O.:120; I.V.:2000; IV Piggyback:100] Out: 2145 [Urine:1275; Drains:10; Blood:400; Chest Tube:310]   Physical Exam:  Cardiovascular: RRR Pulmonary: Clear to auscultation on left and slightly diminished right basilar breath sounds Abdomen: Soft, non tender, bowel sounds present. Extremities: No lower extremity edema. Wounds: Dressing is clean and dry.   Chest Tubes:to suction, no air leak  Lab Results: CBC: Recent Labs    10/14/23 0438 10/15/23 0241  WBC 7.2 9.7  HGB 10.3* 8.7*  HCT 30.5* 26.4*  PLT 490* 475*   BMET:  Recent Labs    10/14/23 0438 10/15/23 0241  NA 133* 129*  K 3.3* 4.1  CL 95* 93*  CO2 28 25  GLUCOSE 130* 339*  BUN 10 23  CREATININE 0.80 1.35*  CALCIUM 8.0* 8.4*    PT/INR:  Recent Labs    10/13/23 1142  LABPROT 13.9  INR 1.1   ABG:  INR: Will add last result for INR, ABG once components are confirmed Will add last 4 CBG results once components are confirmed  Assessment/Plan:  1. CV - SR, SB at times. He has a history of a fib. Apixaban  on hold for now. On Amlodipine  5 mg daily and Ramipril  5 mg daily. Will hold Ramipril  for now secondary to elevated creatinine. 2.  Pulmonary - On room air. Chest tube with 310 cc last 24 hours. Chest tube is to suction, no air leak. CXR this am  appears stable (bibasilar atelectasis, small pleural effusions). We placed chest tubes to water  seal this am. Encourage incentive spirometer and flutter valve. 3. DM-CBGs 443/382/279. On Insulin  prior to admission. Will increase scheduled Insulin  for better glucose control. Of note, patient has pump and prefers to use this. Medicine to follow and make adjustments accordingly.  4. Hypokalemia-has had since admission, sodium 129 this am. Per medicine. 5. Creatinine slightly increased to 1.35. On Ramipril  and Toradol . Will stop Ramipril  for now. If creatinine continues to increase, will stop Toradol . Re check in am 6. Anemia-H and H this am decreased to 8.7 and 26.4 7. On Lovenox  for DVT prophylaxis 8. ID-WBC this am 9700. He was on 2 different antibiotics at home and per patient, finished both (for 5 days each). Gram stain of right pleural fluid showed no organisms and culture shows no growth < 24 hours. Previously treated for Enterobacter and Enterococcus (GB fossa abscess, s/p Xi cholecystectomy and drain placement by IR)  Joel M ZimmermanPA-C 10/15/2023,7:09 AM  Patient seen and examined, agree with above Looks great CXR with good result- still some atelectasis/ consolidation- IS + flutter CBG poorly controlled-  SCD + enoxaparin  Ambulate  Joel Pinion C. Luna Salinas, MD [facility] [phone]

## 2023-10-15 NOTE — Progress Notes (Addendum)
 PROGRESS NOTE  Joel Outman Guice Sr. AVW:[id number] DOB: 07/19/1945 DOA: 10/08/2023 PCP: [name], MD   LOS: 6 days   Brief narrative:  Joel Patches Mathison Sr. is a 78 y.o. male with past medical history significant of hypertension, hyperlipidemia, diabetes, GERD, CAD, atrial fibrillation, bradycardia, obesity, RA, asthma to hospital with recurrent shortness of breath and right-sided chest pain.  Of note, patient had initially presented to [facility] and underwent cholecystectomy on 08/29/2023 and had a follow-up CT scan which showed fluid collection.  Patient presented on 5/9 with continued abdominal pain and was noted to have gastric outlet obstruction with concern for abscess.  He was then admitted and was seen by general surgery and interventional radiology and had IR placed drain which showed Enterococcus and Enterobacter species and had received IV Zosyn .  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  Drain was also reportedly repositioned on 5/23.  Patient has had 2 thoracocentesis for this symptoms.  At this time, patient presented with shortness of breath worse on exertion with right-sided chest pain.  In the ED, patient was notable for hyponatremia with sodium of 131.  Leukocytosis at 12.9.  Case was discussed with interventional radiology and was admitted to hospital for further evaluation and treatment.      Assessment/Plan: Principal Problem:   Recurrent pleural effusion on right Active Problems:   CAD (coronary artery disease)   Essential hypertension   Rheumatoid arthritis involving multiple sites with positive rheumatoid factor (HCC)   Gastroesophageal reflux disease   Type 2 diabetes mellitus with other specified complication (HCC)   Obesity   Asthmatic bronchitis , chronic (HCC)   Atrial fibrillation (HCC)   Hyperlipidemia associated with type 2 diabetes mellitus (HCC)   Recurrent  right pleural effusion Status post cholecystectomy History of intra-abdominal abscess status post drain placement History of recurrent right-sided pleural effusion.  History of drain placement for abdominal abscess following cholecystectomy.  IR on board at this time.  Previous perihepatic drain has been removed and new drain tube has been placed at new site with good drainage at this time.  Patient underwent drain injection to discern any fistulous communication without any findings of fistulous communication.. IR has seen the patient and recommended follow-up in IR clinic in 1 to 2 weeks.   CT scan of the chest abdomen pelvis done on 10/09/2023 showed interval increase in size of loculated at least small volume right pleural effusion with hilar and mediastinal lymphadenopathy.  Stable to slightly increased size of gallbladder fossa collection of free fluid and gas.    Culture from previous pleural fluid tapping on 5/21 with no growth.      Pulmonary was consulted due to loculated effusion CT surgery evaluation was recommended.  Patient has undergone VATS procedure to follow CT surgery recommendations.  Status post chest tube in the wall.  Further management as per CT surgery.  Chest x-ray with some atelectasis consolidation.  Continue incentive spirometry flutter valve.  Continue to ambulate.  Hypertension - Continue home amlodipine  and ramipril .  Blood pressure seems to be stable.   Hyperlipidemia Continue atorvastatin   Diabetes melitis type I with hyperglycemia.. On insulin  pump at home.  Had hyperglycemia so pump was removed and was started on long-acting and sliding scale insulin .  Diabetic Cardiology on board and at this time insulin  pump is available so will be resumed on insulin  pump from home.   GERD - Continue  PPI   CAD - Continue home amlodipine .   Atrial fibrillation Eliquis  on hold     Rheumatoid arthritis - On abatacept  outpatient.   Asthma - Continue home as needed  albuterol .  Currently compensated.  Left ankle pain   seen by orthopedics.  Has ankle splint in place at this time.  X-ray shows degenerative tibiotalar spurring.  DVT prophylaxis: enoxaparin  (LOVENOX ) injection 40 mg Start: 10/14/23 2230 SCD's Start: 10/14/23 1506 Place and maintain sequential compression device Start: 10/08/23 1819   Disposition: Likely home with home health in 1 to 2 days days when okay with CT surgery.  Status is: Inpatient  The patient is inpatient because recurrent pleural effusion, status post VATS procedure 10/14/23.    Code Status:     Code Status: Full Code  Family Communication: Spoke with the patient's son 10/13/2023.  Consultants: Interventional radiology General Surgery PCCM CT surgery   Procedures: Left ankle splint Abdominal drain placement 10/11/2023 Video-assisted thoracoscopy with drainage of pleural effusion and visceral and parietal pleura decortication on 10/14/2023.  Anti-infectives:  Preop cefazolin   Anti-infectives (From admission, onward)    Start     Dose/Rate Route Frequency Ordered Stop   10/14/23 1600  ceFAZolin  (ANCEF ) IVPB 2g/100 mL premix        2 g 200 mL/hr over 30 Minutes Intravenous Every 8 hours 10/14/23 1505 10/15/23 0719   10/13/23 1145  vancomycin (VANCOCIN) IVPB 1000 mg/200 mL premix  Status:  Discontinued        1,000 mg 200 mL/hr over 60 Minutes Intravenous On call to O.R. 10/13/23 1055 10/14/23 0559       Subjective: Today, patient was seen and examined at bedside.  Patient has some discomfort in the chest but denies overt pain, nausea, vomiting, fever, chills or rigor.  Has been resting.  Denies any dyspnea.   Objective: Vitals:   10/15/23 0753 10/15/23 1129  BP: (!) 148/59 (!) 123/58  Pulse: 62 62  Resp: 15 19  Temp: 98.1 F (36.7 C) 98.1 F (36.7 C)  SpO2: 96% 93%    Intake/Output Summary (Last 24 hours) at 10/15/2023 1148 Last data filed at 10/15/2023 0755 Gross per 24 hour  Intake 340 ml   Output 1145 ml  Net -805 ml   Filed Weights   10/14/23 0537 10/14/23 0708 10/14/23 1441  Weight: 89.5 kg 89.5 kg 83.1 kg   Body mass index is 29.58 kg/m.   Physical Exam:  General:  Average built, not in obvious distress, alert awake and oriented, not in distress, deconditioned, HENT:   No scleral pallor or icterus noted. Oral mucosa is moist.  Chest: Diminished breath sounds bilaterally.  Right chest tube with dressing and chest tube. CVS: S1 &S2 heard. No murmur.  Regular rate and rhythm. Abdomen: Soft, nontender, nondistended.  Bowel sounds are heard.  Abdominal drain in place with active drainage. Extremities: No cyanosis, clubbing or edema.  Peripheral pulses are palpable.   Psych: Alert, awake and oriented, normal mood CNS:  No cranial nerve deficits.  Power equal in all extremities.   Skin: Warm and dry.  No rashes noted.   Data Review: I have personally reviewed the following laboratory data and studies,  CBC: Recent Labs  Lab 10/09/23 0422 10/10/23 0602 10/13/23 1142 10/14/23 0438 10/15/23 0241  WBC 11.0* 8.6 9.7 7.2 9.7  HGB 10.5* 9.9* 11.6* 10.3* 8.7*  HCT 30.9* 29.4* 35.4* 30.5* 26.4*  MCV 85.1 87.0 87.6 85.9 87.1  PLT 389 408* 585* 490* 475*  Basic Metabolic Panel: Recent Labs  Lab 10/10/23 0602 10/11/23 1617 10/14/23 0438 10/15/23 0241 10/15/23 0845  NA 129* 130* 133* 129* 131*  K 3.1* 3.6 3.3* 4.1 3.8  CL 95* 95* 95* 93* 91*  CO2 25 24 28 25 28   GLUCOSE 86 363* 130* 339* 283*  BUN 9 11 10 23  24*  CREATININE 0.84 1.00 0.80 1.35* 1.40*  CALCIUM 7.7* 8.3* 8.0* 8.4* 8.6*  MG 1.9  --  1.9  --   --    Liver Function Tests: Recent Labs  Lab 10/09/23 0422 10/10/23 0602  AST 22 18  ALT 19 17  ALKPHOS 93 79  BILITOT 0.5 0.6  PROT 5.5* 5.2*  ALBUMIN 1.9* 1.8*   No results for input(s): "LIPASE", "AMYLASE" in the last 168 hours.  No results for input(s): "AMMONIA" in the last 168 hours. Cardiac Enzymes: No results for input(s): "CKTOTAL",  "CKMB", "CKMBINDEX", "TROPONINI" in the last 168 hours. BNP (last 3 results) No results for input(s): "BNP" in the last 8760 hours.  ProBNP (last 3 results) No results for input(s): "PROBNP" in the last 8760 hours.  CBG: Recent Labs  Lab 10/14/23 1629 10/14/23 1749 10/14/23 1959 10/14/23 2358 10/15/23 0653  GLUCAP 331* 368* 443* 382* 279*   Recent Results (from the past 240 hours)  Surgical pcr screen     Status: None   Collection Time: 10/13/23 11:38 AM   Specimen: Nasal Mucosa; Nasal Swab  Result Value Ref Range Status   MRSA, PCR NEGATIVE NEGATIVE Final   Staphylococcus aureus NEGATIVE NEGATIVE Final    Comment: (NOTE) The Xpert SA Assay (FDA approved for NASAL specimens in patients 34 years of age and older), is one component of a comprehensive surveillance program. It is not intended to diagnose infection nor to guide or monitor treatment. Performed at [facility], [address], [place], [place] [address]   Aerobic/Anaerobic Culture w Gram Stain (surgical/deep wound)     Status: None (Preliminary result)   Collection Time: 10/14/23  8:39 AM   Specimen: Pleural, Right; Body Fluid  Result Value Ref Range Status   Specimen Description PLEURAL  Final   Special Requests RIGHT PLEURAL FLUID  Final   Gram Stain RARE WBC SEEN NO ORGANISMS SEEN   Final   Culture   Final    NO GROWTH < 24 HOURS Performed at [facility], [address] [place], [place] [address]    Report Status PENDING  Incomplete  Aerobic/Anaerobic Culture w Gram Stain (surgical/deep wound)     Status: None (Preliminary result)   Collection Time: 10/14/23  9:00 AM   Specimen: Soft Tissue, Other  Result Value Ref Range Status   Specimen Description TISSUE  Final   Special Requests RIGHT PLEURAL PEEL  Final   Gram Stain RARE WBC SEEN RARE GRAM POSITIVE COCCI   Final   Culture   Final    NO GROWTH < 24 HOURS Performed at [facility], [address], [place], [place]  [address]    Report Status PENDING  Incomplete     Studies: DG Chest Port 1 View Result Date: 10/15/2023 CLINICAL DATA:  Right pleural effusion and chest tubes. [id number]. EXAM: PORTABLE CHEST 1 VIEW COMPARISON:  Portable chest yesterday 10:50 a.m. FINDINGS: 5:21 a.m. 2 right-sided chest tubes are stable in positioning. A minimal pneumothorax is again noted in the lateral right mid chest adjacent to right upper lobe surgical sutures. There is stable linear atelectasis in the  right lower lung field with elevated hemidiaphragm. Small pleural effusions. The remainder of the lungs are clear. Overall aeration seems unchanged. Mild stable cardiomegaly. Stable mediastinum with aortic atherosclerosis. IMPRESSION: 1. Stable minimal pneumothorax in the lateral right mid chest adjacent to right upper lobe surgical sutures. 2. Stable small pleural effusions and right lower lung field atelectasis. 3. Cardiomegaly without CHF. Electronically Signed   By: Denman Fischer M.D.   On: 10/15/2023 07:30   DG Chest Port 1 View Result Date: 10/14/2023 CLINICAL DATA:  Pleural effusion. EXAM: PORTABLE CHEST 1 VIEW COMPARISON:  10/13/2023 FINDINGS: 2 right-sided chest tubes evident. Small collection of pleural gas in the lateral right mid hemithorax is suspected. There is bibasilar atelectasis, right greater than left with probable small effusions. The cardiopericardial silhouette is within normal limits for size. Telemetry leads overlie the chest. IMPRESSION: 1. 2 right-sided chest tubes with suspected small collection of pleural gas in the lateral right mid hemithorax. 2. Bibasilar atelectasis, right greater than left with probable small effusions. Electronically Signed   By: Donnal Fusi M.D.   On: 10/14/2023 13:00   DG Chest 2 View Result Date: 10/13/2023 CLINICAL DATA:  Pleural effusion. History of recurrent right pleural effusion. EXAM: CHEST - 2 VIEW COMPARISON:  10/08/2023. FINDINGS: The heart size and mediastinal contours are  stable. There is atherosclerotic calcification of the aorta. A small loculated pleural effusion with atelectasis is noted at the right lung base. The left lung is clear. No pneumothorax is seen. Degenerative changes are present in the thoracic spine. A drainage catheter is present in the upper abdomen. IMPRESSION: Small loculated right pleural effusion with atelectasis or infiltrate. Electronically Signed   By: Wyvonnia Heimlich M.D.   On: 10/13/2023 18:20      Rosena Conradi, MD  [facility] 10/15/2023  If 7PM-7AM, please contact night-coverage

## 2023-10-15 NOTE — Progress Notes (Signed)
 Mobility Specialist Progress Note;   10/15/23 0857  Mobility  Activity Ambulated with assistance in hallway  Level of Assistance Standby assist, set-up cues, supervision of patient - no hands on  Assistive Device Front wheel walker  Distance Ambulated (ft) 400 ft  Activity Response Tolerated well  Mobility Referral Yes  Mobility visit 1 Mobility  Mobility Specialist Start Time (ACUTE ONLY) 0857  Mobility Specialist Stop Time (ACUTE ONLY) 0915  Mobility Specialist Time Calculation (min) (ACUTE ONLY) 18 min   Pt agreeable to mobility. Required no physical assistance during ambulation, SV. VSS throughout and no c/o when asked. Pt returned back to bed with all needs met, call bell in reach.   Janit Meline Mobility Specialist Please contact via SecureChat or [employer] [phone]

## 2023-10-15 NOTE — Plan of Care (Signed)
  Problem: Education: Goal: Knowledge of General Education information will improve Description: Including pain rating scale, medication(s)/side effects and non-pharmacologic comfort measures Outcome: Progressing   Problem: Health Behavior/Discharge Planning: Goal: Ability to manage health-related needs will improve Outcome: Progressing   Problem: Clinical Measurements: Goal: Ability to maintain clinical measurements within normal limits will improve Outcome: Progressing Goal: Will remain free from infection Outcome: Progressing Goal: Diagnostic test results will improve Outcome: Progressing Goal: Respiratory complications will improve Outcome: Progressing Goal: Cardiovascular complication will be avoided Outcome: Progressing   Problem: Activity: Goal: Risk for activity intolerance will decrease Outcome: Progressing   Problem: Nutrition: Goal: Adequate nutrition will be maintained Outcome: Progressing   Problem: Coping: Goal: Level of anxiety will decrease Outcome: Progressing   Problem: Elimination: Goal: Will not experience complications related to bowel motility Outcome: Progressing Goal: Will not experience complications related to urinary retention Outcome: Progressing   Problem: Pain Managment: Goal: General experience of comfort will improve and/or be controlled Outcome: Progressing   Problem: Safety: Goal: Ability to remain free from injury will improve Outcome: Progressing   Problem: Skin Integrity: Goal: Risk for impaired skin integrity will decrease Outcome: Progressing   Problem: Education: Goal: Ability to describe self-care measures that Behar prevent or decrease complications (Diabetes Survival Skills Education) will improve Outcome: Progressing Goal: Individualized Educational Video(s) Outcome: Progressing   Problem: Coping: Goal: Ability to adjust to condition or change in health will improve Outcome: Progressing   Problem: Fluid  Volume: Goal: Ability to maintain a balanced intake and output will improve Outcome: Progressing   Problem: Health Behavior/Discharge Planning: Goal: Ability to identify and utilize available resources and services will improve Outcome: Progressing Goal: Ability to manage health-related needs will improve Outcome: Progressing   Problem: Metabolic: Goal: Ability to maintain appropriate glucose levels will improve Outcome: Progressing   Problem: Nutritional: Goal: Maintenance of adequate nutrition will improve Outcome: Progressing Goal: Progress toward achieving an optimal weight will improve Outcome: Progressing   Problem: Skin Integrity: Goal: Risk for impaired skin integrity will decrease Outcome: Progressing   Problem: Tissue Perfusion: Goal: Adequacy of tissue perfusion will improve Outcome: Progressing   Problem: Education: Goal: Knowledge of disease or condition will improve Outcome: Progressing Goal: Knowledge of the prescribed therapeutic regimen will improve Outcome: Progressing   Problem: Activity: Goal: Risk for activity intolerance will decrease Outcome: Progressing   Problem: Cardiac: Goal: Will achieve and/or maintain hemodynamic stability Outcome: Progressing   Problem: Clinical Measurements: Goal: Postoperative complications will be avoided or minimized Outcome: Progressing   Problem: Respiratory: Goal: Respiratory status will improve Outcome: Progressing   Problem: Pain Management: Goal: Pain level will decrease Outcome: Progressing   Problem: Skin Integrity: Goal: Wound healing without signs and symptoms infection will improve Outcome: Progressing

## 2023-10-15 NOTE — Progress Notes (Addendum)
 Referring Physician(s): Pokhrel,Laxman [facility]   Supervising Physician: Marland Silvas  Patient Status:  [facility] - In-pt  Chief Complaint:  S/p cholecystectomy 09/05/23, HIDA scan on 09/21/23 showed no leak, but CT showed GB fossa abscess S/p GB fossa abscess  drain on 5/10 S/p GB fossa drain repositioning on 5/29  Patient also underwent right VATS/decortication on 6/1   Subjective:  Sitting on the side of the bed, NAD. RN and NT at bedside.  States that his surgery went well, he has no complaints at the moment. States that he could be going home tomorrow.  Drain f/u discussed with the patient, he states that he done it before and familiar with the proceed.    Allergies: Codeine, Lipitor [atorvastatin], Invokana [canagliflozin], Losartan, Roxicodone  [oxycodone ], and Vicodin [hydrocodone -acetaminophen ]  Medications: Prior to Admission medications   Medication Sig Start Date End Date Taking? Authorizing Provider  albuterol  (PROVENTIL ) (2.5 MG/3ML) 0.083% nebulizer solution Take 3 mLs (2.5 mg total) by nebulization every 6 (six) hours as needed for wheezing or shortness of breath. 10/02/23  Yes [name], MD  amLODipine  (NORVASC ) 5 MG tablet Take 1 tablet (5 mg total) by mouth daily. Patient taking differently: Take 5 mg by mouth in the morning. 09/04/23  Yes [name], MD  amoxicillin -clavulanate (AUGMENTIN ) 875-125 MG tablet Take 1 tablet by mouth 2 (two) times daily.   Yes [provider]  apixaban  (ELIQUIS ) 5 MG TABS tablet Take 1 tablet (5 mg total) by mouth 2 (two) times daily. 07/16/23  Yes Lenise Quince, MD  ascorbic acid  (VITAMIN C ) 500 MG tablet Take 500 mg by mouth in the morning.   Yes [provider]  calcium carbonate (OSCAL) 1500 (600 Ca) MG TABS tablet Take 600 mg of elemental calcium by mouth in the morning.   Yes [provider]  calcium carbonate (TUMS - DOSED IN MG ELEMENTAL CALCIUM) 500 MG chewable tablet Chew 1 tablet by mouth as  needed for indigestion or heartburn.   Yes [provider]  cholecalciferol (VITAMIN D3) 25 MCG (1000 UNIT) tablet Take 1,000 Units by mouth in the morning.   Yes [provider]  Insulin  Aspart, w/Niacinamide, (FIASP ) 100 UNIT/ML SOLN 77 Units continuous. Insulin  pump 09/27/22  Yes [provider]  insulin  glargine (LANTUS ) 100 UNIT/ML injection Inject 16 Units into the skin as needed (If pump goes out).   Yes [provider]  KRILL OIL PO Take 400 mg by mouth in the morning.   Yes [provider]  loratadine  (CLARITIN ) 10 MG tablet Take 10 mg by mouth in the morning.   Yes [provider]  methocarbamol  (ROBAXIN ) 500 MG tablet Take 1 tablet (500 mg total) by mouth every 12 (twelve) hours as needed for muscle spasms. Patient taking differently: Take 500 mg by mouth every 6 (six) hours as needed for muscle spasms. 09/28/23  Yes [name], MD  Multiple Vitamin (MULTIVITAMIN WITH MINERALS) TABS Take 1 tablet by mouth in the morning. Centrum   Yes [provider]  ondansetron  (ZOFRAN -ODT) 4 MG disintegrating tablet Take 1 tablet (4 mg total) by mouth every 8 (eight) hours as needed for nausea or vomiting. 09/09/23  Yes Ninetta Basket, MD  pantoprazole  (PROTONIX ) 40 MG tablet TAKE 1 TABLET BY MOUTH DAILY Patient taking differently: Take 40 mg by mouth in the morning. 06/27/23  Yes Mealor, Augustus E, MD  Potassium 99 MG TABS Take 99 mg by mouth in the morning.   Yes  [provider]  pyridOXINE (VITAMIN B6) 50 MG tablet Take 50 mg by mouth in the morning.   Yes [provider]  ramipril  (ALTACE ) 5 MG capsule Take 1 capsule (5 mg total) by mouth daily. Patient taking differently: Take 5 mg by mouth in the morning. 09/04/23  Yes [name], MD  sodium chloride  flush 0.9 % SOLN injection Please flush drain with 5 mL normal saline from pre-filled syringe once daily. 10/12/23  Yes Pommier, Wyatt H, PA-C  traMADol   (ULTRAM ) 50 MG tablet Take 1 tablet (50 mg total) by mouth every 6 (six) hours as needed for moderate pain (pain score 4-6). 09/25/23  Yes [place], Eric J, DO  vitamin E  400 UNIT capsule Take 400 Units by mouth in the morning.   Yes [provider]  zinc gluconate 50 MG tablet Take 50 mg by mouth in the morning.   Yes [provider]  Abatacept  (ORENCIA  IV) Inject 750 mg into the vein every 28 (twenty-eight) days. Patient not taking: Reported on 10/08/2023    [provider]  albuterol  (VENTOLIN  HFA) 108 (90 Base) MCG/ACT inhaler Inhale 2 puffs into the lungs every 6 (six) hours as needed for wheezing or shortness of breath. Patient not taking: Reported on 10/08/2023 10/01/23   [name], MD  ONE TOUCH ULTRA TEST test strip 1 each daily. Pt says 2-3 times daily 05/26/16   [provider]  pravastatin  (PRAVACHOL ) 40 MG tablet Take 40 mg by mouth in the morning. Patient not taking: Reported on 10/08/2023    [provider]     Vital Signs: BP (!) 148/59 (BP Location: Left Arm)   Pulse 62   Temp 98.1 F (36.7 C) (Oral)   Resp 15   Ht 5' 5.98" (1.676 m)   Wt 183 lb 3.2 oz (83.1 kg)   SpO2 96%   BMI 29.58 kg/m   Physical Exam Vitals reviewed.  Constitutional:      General: He is not in acute distress.    Appearance: He is not ill-appearing.  HENT:     Head: Normocephalic and atraumatic.  Pulmonary:     Effort: Pulmonary effort is normal.  Abdominal:     Comments: Positive RUQ drain to a gravity bag. Site is unremarkable with no erythema, edema, tenderness, bleeding or drainage. Suture and stat lock in place. Dressing is clean, dry, and intact. ~5 ml of  dark green colored fluid noted in the bag. Drain flushes well, does not aspirate.    Skin:    General: Skin is warm and dry.     Coloration: Skin is not cyanotic or pale.  Neurological:     Mental Status: He is alert and oriented to person, place, and time.  Psychiatric:        Mood  and Affect: Mood normal.        Behavior: Behavior normal.     Imaging: DG Chest Port 1 View Result Date: 10/15/2023 CLINICAL DATA:  Right pleural effusion and chest tubes. [id number]. EXAM: PORTABLE CHEST 1 VIEW COMPARISON:  Portable chest yesterday 10:50 a.m. FINDINGS: 5:21 a.m. 2 right-sided chest tubes are stable in positioning. A minimal pneumothorax is again noted in the lateral right mid chest adjacent to right upper lobe surgical sutures. There is stable linear atelectasis in the right lower lung field with elevated hemidiaphragm. Small pleural effusions. The remainder of the lungs are clear. Overall aeration seems unchanged. Mild stable cardiomegaly. Stable mediastinum with aortic atherosclerosis. IMPRESSION: 1.  Stable minimal pneumothorax in the lateral right mid chest adjacent to right upper lobe surgical sutures. 2. Stable small pleural effusions and right lower lung field atelectasis. 3. Cardiomegaly without CHF. Electronically Signed   By: Denman Fischer M.D.   On: 10/15/2023 07:30   DG Chest Port 1 View Result Date: 10/14/2023 CLINICAL DATA:  Pleural effusion. EXAM: PORTABLE CHEST 1 VIEW COMPARISON:  10/13/2023 FINDINGS: 2 right-sided chest tubes evident. Small collection of pleural gas in the lateral right mid hemithorax is suspected. There is bibasilar atelectasis, right greater than left with probable small effusions. The cardiopericardial silhouette is within normal limits for size. Telemetry leads overlie the chest. IMPRESSION: 1. 2 right-sided chest tubes with suspected small collection of pleural gas in the lateral right mid hemithorax. 2. Bibasilar atelectasis, right greater than left with probable small effusions. Electronically Signed   By: Donnal Fusi M.D.   On: 10/14/2023 13:00   DG Chest 2 View Result Date: 10/13/2023 CLINICAL DATA:  Pleural effusion. History of recurrent right pleural effusion. EXAM: CHEST - 2 VIEW COMPARISON:  10/08/2023. FINDINGS: The heart size and  mediastinal contours are stable. There is atherosclerotic calcification of the aorta. A small loculated pleural effusion with atelectasis is noted at the right lung base. The left lung is clear. No pneumothorax is seen. Degenerative changes are present in the thoracic spine. A drainage catheter is present in the upper abdomen. IMPRESSION: Small loculated right pleural effusion with atelectasis or infiltrate. Electronically Signed   By: Wyvonnia Heimlich M.D.   On: 10/13/2023 18:20   IR Guided Drain W Catheter Placement Result Date: 10/12/2023 INDICATION: 78 year old male status post cholecystectomy complicated postoperatively by biloma formation in the gallbladder fossa status post percutaneous drain placement on 09/22/2023 complicated by trans pleural drain placement resulting in bilious pleural effusion, now status post drain revision on 10/05/2023. Recent CT evidence of malpositioning of newly placed drain with recurrence of biloma. EXAM: Ultrasound and fluoroscopic guided biloma drain placement MEDICATIONS: The patient is currently admitted to the hospital and receiving intravenous antibiotics. The antibiotics were administered within an appropriate time frame prior to the initiation of the procedure. ANESTHESIA/SEDATION: None. COMPLICATIONS: None immediate. PROCEDURE: Informed written consent was obtained from the patient after a thorough discussion of the procedural risks, benefits and alternatives. All questions were addressed. Maximal Sterile Barrier Technique was utilized including caps, mask, sterile gowns, sterile gloves, sterile drape, hand hygiene and skin antiseptic. A timeout was performed prior to the initiation of the procedure. Preprocedure ultrasound evaluation of the right upper quadrant demonstrated an anechoic fluid collection in the gallbladder fossa. Just lateral to this fluid collection the indwelling drain was visualized, not within the fluid collection. Subdermal Local anesthesia was  administered at the planned needle entry site. A small skin nick was made. Deeper local anesthetic was administered under ultrasound visualization to the periphery of the biloma. Next, an 8 [place] mini loop skater drain was introduced by trocar fashion into the biloma. The pigtail loop was formed within the biloma. Approximately 30 cc of bilious fluid was aspirated. A contrast injection demonstrated position within the biloma, no evidence of communication with the common bile duct or intrahepatic biliary trees. No evidence of pleural fistulization. The drain was affixed to the skin with an interrupted 0 silk suture. The previously indwelling drainage catheter was removed without complication. The drain was placed to bag drainage. Sterile bandage was applied. The patient tolerated the procedure well was transferred back to the floor in good condition. IMPRESSION: Technically  successful ultrasound and fluoroscopic guided placement of 8 French mini loop biloma drainage catheter. Successful removal of previously placed however malpositioned right upper quadrant catheter. Creasie Doctor, MD [facility] Electronically Signed   By: Creasie Doctor M.D.   On: 10/12/2023 13:56    Labs:  CBC: Recent Labs    10/10/23 0602 10/13/23 1142 10/14/23 0438 10/15/23 0241  WBC 8.6 9.7 7.2 9.7  HGB 9.9* 11.6* 10.3* 8.7*  HCT 29.4* 35.4* 30.5* 26.4*  PLT 408* 585* 490* 475*    COAGS: Recent Labs    05/28/23 1737 06/01/23 1343 08/17/23 1413 09/22/23 0359 10/05/23 0905 10/13/23 1142  INR 2.8*   < > 1.1 1.2 1.2 1.1  APTT 50*  --   --   --   --   --    < > = values in this interval not displayed.    BMP: Recent Labs    10/10/23 0602 10/11/23 1617 10/14/23 0438 10/15/23 0241  NA 129* 130* 133* 129*  K 3.1* 3.6 3.3* 4.1  CL 95* 95* 95* 93*  CO2 25 24 28 25   GLUCOSE 86 363* 130* 339*  BUN 9 11 10 23   CALCIUM 7.7* 8.3* 8.0* 8.4*  CREATININE 0.84  1.00 0.80 1.35*  GFRNONAA >60 >60 >60 54*    LIVER FUNCTION TESTS: Recent Labs    09/21/23 0734 10/05/23 1933 10/09/23 0422 10/10/23 0602  BILITOT 1.0 0.8 0.5 0.6  AST 34 44* 22 18  ALT 20 32 19 17  ALKPHOS 99 94 93 79  PROT 6.7 6.2* 5.5* 5.2*  ALBUMIN 2.9* 2.5* 1.9* 1.8*    Assessment and Plan:  78 y.o. male with S/p cholecystectomy 09/05/23, HIDA scan on 09/21/23 showed no leak, but CT showed GB fossa abscess; S/p GB fossa abscess  drain on 5/10; drain injection on 5/20 showed possible perihepatic fluid collection/biloma, s/p right thora on 5/21, s/p drain removal and new GB Foss drain placement and thora on 5/23, S/p GB fossa drain repositioning on 5/29 , patient also has hx recurrent right pleural effusion, s/p thoracentesis, he ultimately underwent right VATS/decortication on 6/1.   VSS CBC stable  Output 10mL overnight, appears simple bile   Drain Location: RUQ Size: 8 Fr Date of placement: 5/29  Currently to: Drain collection device: gravity 24 hour output:  Output by Drain (mL) 10/13/23 0701 - 10/13/23 1900 10/13/23 1901 - 10/14/23 0700 10/14/23 0701 - 10/14/23 1900 10/14/23 1901 - 10/15/23 0700 10/15/23 0701 - 10/15/23 0947  Closed System Drain 1 Lateral;Right RUQ Other (Comment) 8 Fr.  50 10      Interval imaging/drain manipulation:  5/10: initial GB fossa drain placement  5/20: GB fossa drain injection 5/23: drain contributing to bilious pleural fluid collection was removed and new drain was placed 5/29: GB fossa drain exchange and repositioning    Current examination: Flushes easily, does not aspirate  Insertion site unremarkable. Suture and stat lock in place. Dressed appropriately.   Plan: Continue TID flushes with 5 cc NS. Record output Q shift. Dressing changes QD or PRN if soiled.  Call IR APP or on call IR MD if difficulty flushing or sudden change in drain output.  Repeat imaging/possible drain injection once output < 10 mL/QD (excluding flush  material). Consideration for drain removal if output is < 10 mL/QD (excluding flush material), pending discussion with the providing surgical service.  Discharge planning: Please contact IR APP or on call IR MD prior to patient d/c to  ensure appropriate follow up plans are in place. Typically patient will follow up with IR clinic 10-14 days post d/c for repeat imaging/possible drain injection. IR scheduler will contact patient with date/time of appointment. Patient will need to flush drain QD with 5 cc NS, record output QD, dressing changes every 2-3 days or earlier if soiled.   IR will continue to follow - please call with questions or concerns.   Electronically Signed: Darel Ebbs, PA-C 10/15/2023, 9:44 AM   I spent a total of 15 Minutes at the the patient's bedside AND on the patient's hospital floor or unit, greater than 50% of which was counseling/coordinating care for GB fossa drain f/u.   This chart was dictated using voice recognition software.  Despite best efforts to proofread,  errors can occur which can change the documentation meaning.

## 2023-10-15 NOTE — Progress Notes (Signed)
 Brief PCCM Note:  Patient doing well post-surgery.  He has chest tubes in post surgery being managed by CTS. Nothing further per pulmonary  Please call if anything else needed.  Duaine German, MD [facility] Office: [phone]   See Amion for personal pager PCCM on call pager [phone] until 7pm. Please call Elink 7p-7a. [phone]

## 2023-10-15 NOTE — Progress Notes (Signed)
 [facility] night cross cover note:   I was notified by the patient's RN that pt's HS CBG is 382. Does not currently have orders for HS SSI. Most recent prior CBG was  443 at 2000, prompting 6 units of sliding scale novolog  around 2030.   I subsequently ordered NovoLog  4 units subcu x 1 dose now.    Camelia Cavalier, DO Hospitalist

## 2023-10-15 NOTE — Plan of Care (Signed)
  Problem: Education: Goal: Knowledge of General Education information will improve Description: Including pain rating scale, medication(s)/side effects and non-pharmacologic comfort measures Outcome: Progressing   Problem: Health Behavior/Discharge Planning: Goal: Ability to manage health-related needs will improve Outcome: Progressing   Problem: Activity: Goal: Risk for activity intolerance will decrease Outcome: Progressing   Problem: Nutritional: Goal: Maintenance of adequate nutrition will improve Outcome: Progressing   Problem: Education: Goal: Knowledge of disease or condition will improve Outcome: Progressing Goal: Knowledge of the prescribed therapeutic regimen will improve Outcome: Progressing   Problem: Activity: Goal: Risk for activity intolerance will decrease Outcome: Progressing

## 2023-10-15 NOTE — Progress Notes (Signed)
 Pt given flutter valve per order. Pt was educated on how to use flutter valve. Pt demonstrated how to use valve correctly. Pt also demonstrated correct use of incentive spirometer.

## 2023-10-15 NOTE — Progress Notes (Signed)
 Pt is refusing Tylenol , Gabapentin, & stool softener. Pt states that his Rheumatologist instructed him not to take Tylenol  due to increased liver enzymes, states he does not want to start Gabapentin, & he has had 2 BM this evening. Attempted to notify Howerter, MD for red med refusal.   Thang Flett L Chava Dulac, RN

## 2023-10-15 NOTE — Inpatient Diabetes Management (Signed)
 Inpatient Diabetes Program Recommendations  AACE/ADA: New Consensus Statement on Inpatient Glycemic Control (2015)  Target Ranges:  Prepandial:   less than 140 mg/dL      Peak postprandial:   less than 180 mg/dL (1-2 hours)      Critically ill patients:  140 - 180 mg/dL   Lab Results  Component Value Date   GLUCAP 279 (H) 10/15/2023   HGBA1C 8.3 05/07/2023   Inpatient Diabetes Program Recommendations:   Spoke with patient @ bedside regarding insulin  pump restarting. Patient has supplies and insulin  ready to restart insulin  pump. Received verbal order from Dr. Efrain Grant to restart insulin  pump. Communicated with RN Patrice Wall.  Thank you, Dandy Lazaro E. Nesbit Michon, RN, MSN, CDCES  Diabetes Coordinator Inpatient Glycemic Control Team Team Pager [phone] (8am-5pm) 10/15/2023 9:30 AM

## 2023-10-16 ENCOUNTER — Inpatient Hospital Stay (HOSPITAL_COMMUNITY)

## 2023-10-16 DIAGNOSIS — J90 Pleural effusion, not elsewhere classified: Secondary | ICD-10-CM | POA: Diagnosis not present

## 2023-10-16 LAB — COMPREHENSIVE METABOLIC PANEL WITH GFR
ALT: 16 U/L (ref 0–44)
AST: 23 U/L (ref 15–41)
Albumin: 1.9 g/dL — ABNORMAL LOW (ref 3.5–5.0)
Alkaline Phosphatase: 199 U/L — ABNORMAL HIGH (ref 38–126)
Anion gap: 8 (ref 5–15)
BUN: 24 mg/dL — ABNORMAL HIGH (ref 8–23)
CO2: 31 mmol/L (ref 22–32)
Calcium: 8.3 mg/dL — ABNORMAL LOW (ref 8.9–10.3)
Chloride: 97 mmol/L — ABNORMAL LOW (ref 98–111)
Creatinine, Ser: 0.95 mg/dL (ref 0.61–1.24)
GFR, Estimated: >60 mL/min (ref >60)
Glucose, Bld: 108 mg/dL — ABNORMAL HIGH (ref 70–99)
Potassium: 3.7 mmol/L (ref 3.5–5.1)
Sodium: 136 mmol/L (ref 135–145)
Total Bilirubin: 0.4 mg/dL (ref 0.0–1.2)
Total Protein: 4.9 g/dL — ABNORMAL LOW (ref 6.5–8.1)

## 2023-10-16 LAB — SURGICAL PATHOLOGY

## 2023-10-16 LAB — CBC
HCT: 24.2 % — ABNORMAL LOW (ref 39.0–52.0)
Hemoglobin: 7.9 g/dL — ABNORMAL LOW (ref 13.0–17.0)
MCH: 28.5 pg (ref 26.0–34.0)
MCHC: 32.6 g/dL (ref 30.0–36.0)
MCV: 87.4 fL (ref 80.0–100.0)
Platelets: 459 10*3/uL — ABNORMAL HIGH (ref 150–400)
RBC: 2.77 MIL/uL — ABNORMAL LOW (ref 4.22–5.81)
RDW: 13.3 % (ref 11.5–15.5)
WBC: 10.1 10*3/uL (ref 4.0–10.5)
nRBC: 0 % (ref 0.0–0.2)

## 2023-10-16 NOTE — Progress Notes (Signed)
 PROGRESS NOTE  Joel Miron Kitko Sr. ZOX:[id number] DOB: 1945-09-11 DOA: 10/08/2023 PCP: [name], MD   LOS: 7 days   Brief narrative:  Joel Patches Menges Sr. is a 78 y.o. male with past medical history significant of hypertension, hyperlipidemia, diabetes, GERD, CAD, atrial fibrillation, bradycardia, obesity, RA, asthma to hospital with recurrent shortness of breath and right-sided chest pain.  Of note, patient had initially presented to [facility] and underwent cholecystectomy on 08/29/2023 and had a follow-up CT scan which showed fluid collection.  Patient presented on 5/9 with continued abdominal pain and was noted to have gastric outlet obstruction with concern for abscess.  He was then admitted and was seen by general surgery and interventional radiology and had IR placed drain which showed Enterococcus and Enterobacter species and had received IV Zosyn .  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  Drain was also reportedly repositioned on 5/23.  Patient has had 2 thoracocentesis for this symptoms.  At this time, patient presented with shortness of breath worse on exertion with right-sided chest pain.  In the ED, patient was notable for hyponatremia with sodium of 131.  Leukocytosis at 12.9.  Case was discussed with interventional radiology and was admitted to hospital for further evaluation and treatment.      Assessment/Plan: Principal Problem:   Recurrent pleural effusion on right Active Problems:   CAD (coronary artery disease)   Essential hypertension   Rheumatoid arthritis involving multiple sites with positive rheumatoid factor (HCC)   Gastroesophageal reflux disease   Type 2 diabetes mellitus with other specified complication (HCC)   Obesity   Asthmatic bronchitis , chronic (HCC)   Atrial fibrillation (HCC)   Hyperlipidemia associated with type 2 diabetes mellitus (HCC)   Recurrent  right pleural effusion Status post cholecystectomy History of intra-abdominal abscess status post drain placement History of recurrent right-sided pleural effusion.  History of drain placement for abdominal abscess following cholecystectomy.  IR on board at this time.  Previous perihepatic drain has been removed and new drain tube has been placed at new site with good drainage at this time.   No fistulous communication was noted when drains injection was done.  IR has seen the patient and recommended follow-up in IR clinic in 1 to 2 weeks.   CT scan of the chest abdomen pelvis done on 10/09/2023 showed interval increase in size of loculated at least small volume right pleural effusion with hilar and mediastinal lymphadenopathy.  Stable to slightly increased size of gallbladder fossa collection of free fluid and gas.    Culture from previous pleural fluid tapping on 5/21 with no growth.      Pulmonary was consulted due to loculated effusion CT surgery evaluation was recommended.  Patient has undergone VATS procedure and chest tube has been removed today.  Further management as per CT surgery.  Repeat chest x-ray to follow.  Continue incentive spirometry and flutter valve.    Hypertension - Continue home amlodipine    Blood pressure seems to be stable.  Ramipril  on hold.   Hyperlipidemia Continue atorvastatin   Diabetes melitis type I with hyperglycemia.. On insulin  pump at home.  Initially had hyperglycemia so pump was removed and was started on long-acting and sliding scale insulin .  Diabetic coordinator on board and has been resumed on insulin  pump.    GERD - Continue PPI   CAD - Continue home amlodipine .   Atrial fibrillation Eliquis  on hold, currently  on Lovenox .    Rheumatoid arthritis - On abatacept  outpatient.   Asthma - Continue home as needed albuterol .  Currently compensated.  Left ankle pain   seen by orthopedics.  Has ankle splint in place at this time.  X-ray shows  degenerative tibiotalar spurring.  DVT prophylaxis: enoxaparin  (LOVENOX ) injection 40 mg Start: 10/14/23 2230 SCD's Start: 10/14/23 1506 Place and maintain sequential compression device Start: 10/08/23 1819   Disposition: Likely home with home health likely on 10/17/2023 when okay with CT surgery..  Status is: Inpatient  The patient is inpatient because recurrent pleural effusion, status post VATS procedure 10/14/23.    Code Status:     Code Status: Full Code  Family Communication: Spoke with the patient's son 10/13/2023.  Consultants: Interventional radiology General Surgery PCCM CT surgery   Procedures: Left ankle splint Abdominal drain placement 10/11/2023 by interventional radiology Video-assisted thoracoscopy with drainage of pleural effusion and visceral and parietal pleura decortication on 10/14/2023.  Anti-infectives: None    Subjective: Today, patient was seen and examined at bedside.  Patient complains of mild chest discomfort and had his chest tube removed.  Denies any nausea vomiting fever chills shortness of breath or dyspnea.    Objective: Vitals:   10/16/23 0257 10/16/23 0737  BP: 117/61 131/69  Pulse: (!) 59 64  Resp: 15 15  Temp: 97.6 F (36.4 C) 97.8 F (36.6 C)  SpO2: 93% 94%    Intake/Output Summary (Last 24 hours) at 10/16/2023 1034 Last data filed at 10/16/2023 0737 Gross per 24 hour  Intake 1310 ml  Output 1150 ml  Net 160 ml   Filed Weights   10/14/23 0537 10/14/23 0708 10/14/23 1441  Weight: 89.5 kg 89.5 kg 83.1 kg   Body mass index is 29.58 kg/m.   Physical Exam:  General:  Average built, not in obvious distress, alert awake and oriented, not in distress, deconditioned, HENT:   No scleral pallor or icterus noted. Oral mucosa is moist.  Chest: Diminished breath sounds bilaterally.   CVS: S1 &S2 heard. No murmur.  Regular rate and rhythm. Abdomen: Soft, nontender, nondistended.  Bowel sounds are heard.  Abdominal drain in place with  some drainage. Extremities: No cyanosis, clubbing or edema.  Peripheral pulses are palpable.   Psych: Alert, awake and oriented, normal mood CNS:  No cranial nerve deficits.  Power equal in all extremities.   Skin: Warm and dry.  No rashes noted.   Data Review: I have personally reviewed the following laboratory data and studies,  CBC: Recent Labs  Lab 10/10/23 0602 10/13/23 1142 10/14/23 0438 10/15/23 0241 10/16/23 0244  WBC 8.6 9.7 7.2 9.7 10.1  HGB 9.9* 11.6* 10.3* 8.7* 7.9*  HCT 29.4* 35.4* 30.5* 26.4* 24.2*  MCV 87.0 87.6 85.9 87.1 87.4  PLT 408* 585* 490* 475* 459*   Basic Metabolic Panel: Recent Labs  Lab 10/10/23 0602 10/11/23 1617 10/14/23 0438 10/15/23 0241 10/15/23 0845 10/16/23 0244  NA 129* 130* 133* 129* 131* 136  K 3.1* 3.6 3.3* 4.1 3.8 3.7  CL 95* 95* 95* 93* 91* 97*  CO2 25 24 28 25 28 31   GLUCOSE 86 363* 130* 339* 283* 108*  BUN 9 11 10 23  24* 24*  CREATININE 0.84 1.00 0.80 1.35* 1.40* 0.95  CALCIUM 7.7* 8.3* 8.0* 8.4* 8.6* 8.3*  MG 1.9  --  1.9  --   --   --    Liver Function Tests: Recent Labs  Lab 10/10/23 0602 10/16/23 0244  AST 18 23  ALT 17 16  ALKPHOS 79 199*  BILITOT 0.6 0.4  PROT 5.2* 4.9*  ALBUMIN 1.8* 1.9*   No results for input(s): "LIPASE", "AMYLASE" in the last 168 hours.  No results for input(s): "AMMONIA" in the last 168 hours. Cardiac Enzymes: No results for input(s): "CKTOTAL", "CKMB", "CKMBINDEX", "TROPONINI" in the last 168 hours. BNP (last 3 results) No results for input(s): "BNP" in the last 8760 hours.  ProBNP (last 3 results) No results for input(s): "PROBNP" in the last 8760 hours.  CBG: Recent Labs  Lab 10/14/23 1629 10/14/23 1749 10/14/23 1959 10/14/23 2358 10/15/23 0653  GLUCAP 331* 368* 443* 382* 279*   Recent Results (from the past 240 hours)  Surgical pcr screen     Status: None   Collection Time: 10/13/23 11:38 AM   Specimen: Nasal Mucosa; Nasal Swab  Result Value Ref Range Status   MRSA,  PCR NEGATIVE NEGATIVE Final   Staphylococcus aureus NEGATIVE NEGATIVE Final    Comment: (NOTE) The Xpert SA Assay (FDA approved for NASAL specimens in patients 58 years of age and older), is one component of a comprehensive surveillance program. It is not intended to diagnose infection nor to guide or monitor treatment. Performed at [facility], [address] [place], [place] [address]   Aerobic/Anaerobic Culture w Gram Stain (surgical/deep wound)     Status: None (Preliminary result)   Collection Time: 10/14/23  8:39 AM   Specimen: Pleural, Right; Body Fluid  Result Value Ref Range Status   Specimen Description PLEURAL  Final   Special Requests RIGHT PLEURAL FLUID  Final   Gram Stain RARE WBC SEEN NO ORGANISMS SEEN   Final   Culture   Final    NO GROWTH 2 DAYS Performed at [facility], [address], [place], [place] [address]    Report Status PENDING  Incomplete  Aerobic/Anaerobic Culture w Gram Stain (surgical/deep wound)     Status: None (Preliminary result)   Collection Time: 10/14/23  9:00 AM   Specimen: Soft Tissue, Other  Result Value Ref Range Status   Specimen Description TISSUE  Final   Special Requests RIGHT PLEURAL PEEL  Final   Gram Stain RARE WBC SEEN RARE GRAM POSITIVE COCCI   Final   Culture   Final    NO GROWTH 2 DAYS Performed at [facility], [address], [place], [place] [address]    Report Status PENDING  Incomplete     Studies: DG CHEST PORT 1 VIEW Result Date: 10/16/2023 CLINICAL DATA:  Pneumothorax follow-up EXAM: PORTABLE CHEST 1 VIEW COMPARISON:  October 15, 2023 FINDINGS: No change in the right chest tubes position. No change in minute right lateral apical pneumothorax. Persistent right pleural reaction with right lower lobe hypoventilatory atelectasis. No consolidations or significant congestive changes IMPRESSION: No change in the right chest tubes position. No change in minute right lateral apical pneumothorax. Persistent  right pleural reaction with right lower lobe hypoventilatory atelectasis. Electronically Signed   By: Fredrich Jefferson M.D.   On: 10/16/2023 08:30   DG Chest Port 1 View Result Date: 10/15/2023 CLINICAL DATA:  Right pleural effusion and chest tubes. [id number]. EXAM: PORTABLE CHEST 1 VIEW COMPARISON:  Portable chest yesterday 10:50 a.m. FINDINGS: 5:21 a.m. 2 right-sided chest tubes are stable in positioning. A minimal pneumothorax is again noted in the lateral right mid chest adjacent to right upper lobe surgical sutures. There is stable linear atelectasis in the right lower lung field with elevated hemidiaphragm.  Small pleural effusions. The remainder of the lungs are clear. Overall aeration seems unchanged. Mild stable cardiomegaly. Stable mediastinum with aortic atherosclerosis. IMPRESSION: 1. Stable minimal pneumothorax in the lateral right mid chest adjacent to right upper lobe surgical sutures. 2. Stable small pleural effusions and right lower lung field atelectasis. 3. Cardiomegaly without CHF. Electronically Signed   By: Denman Fischer M.D.   On: 10/15/2023 07:30   DG Chest Port 1 View Result Date: 10/14/2023 CLINICAL DATA:  Pleural effusion. EXAM: PORTABLE CHEST 1 VIEW COMPARISON:  10/13/2023 FINDINGS: 2 right-sided chest tubes evident. Small collection of pleural gas in the lateral right mid hemithorax is suspected. There is bibasilar atelectasis, right greater than left with probable small effusions. The cardiopericardial silhouette is within normal limits for size. Telemetry leads overlie the chest. IMPRESSION: 1. 2 right-sided chest tubes with suspected small collection of pleural gas in the lateral right mid hemithorax. 2. Bibasilar atelectasis, right greater than left with probable small effusions. Electronically Signed   By: Donnal Fusi M.D.   On: 10/14/2023 13:00      Rosena Conradi, MD  [facility] 10/16/2023  If 7PM-7AM, please contact night-coverage

## 2023-10-16 NOTE — Progress Notes (Addendum)
      [address]       [place] [address]             [phone]       2 Days Post-Op Procedure(s) (LRB): VIDEO ASSISTED THORACOSCOPY (VATS)/DECORTICATION (Right)  Subjective: Patient finishing breakfast this am. He walked several times yesterday. He states his breathing is "pretty good"  Objective: Vital signs in last 24 hours: Temp:  [97.6 F (36.4 C)-98.3 F (36.8 C)] 97.6 F (36.4 C) (06/03 0257) Pulse Rate:  [59-65] 59 (06/03 0257) Cardiac Rhythm: Normal sinus rhythm (06/02 1913) Resp:  [15-20] 15 (06/03 0257) BP: (110-148)/(57-64) 117/61 (06/03 0257) SpO2:  [92 %-96 %] 93 % (06/03 0257)     Intake/Output from previous day: 06/02 0701 - 06/03 0700 In: 1204 [P.O.:1194] Out: 1150 [Urine:970; Drains:20; Chest Tube:160]   Physical Exam:  Cardiovascular: RRR Pulmonary: Clear to auscultation on left and slightly diminished right basilar breath sounds Abdomen: Soft, non tender, bowel sounds present. Extremities: No lower extremity edema. Wounds: Dressing is clean and dry.   Chest Tubes:to water  sea;, no air leak (cough weak)  Lab Results: CBC: Recent Labs    10/15/23 0241 10/16/23 0244  WBC 9.7 10.1  HGB 8.7* 7.9*  HCT 26.4* 24.2*  PLT 475* 459*   BMET:  Recent Labs    10/15/23 0845 10/16/23 0244  NA 131* 136  K 3.8 3.7  CL 91* 97*  CO2 28 31  GLUCOSE 283* 108*  BUN 24* 24*  CREATININE 1.40* 0.95  CALCIUM 8.6* 8.3*    PT/INR:  Recent Labs    10/13/23 1142  LABPROT 13.9  INR 1.1   ABG:  INR: Will add last result for INR, ABG once components are confirmed Will add last 4 CBG results once components are confirmed  Assessment/Plan:  1. CV - SR, SB at times. He has a history of a fib. Apixaban  on hold for now; Will discuss with surgeon when ok to restart (? Later today if chest tubes removed). On Amlodipine  5 mg daily.  2.  Pulmonary - On room air. Chest tube with 160 cc last 24 hours. Chest tube is to water  seal, no air  leak. CXR this am appears stable (small right lateral pneumothorax, bibasilar atelectasis, small pleural effusions). Likely remove chest tubes. Check CXR later today.  Encourage incentive spirometer and flutter valve. 3. DM-CBGs 74/171/279. Has Insulin  pump and is using since yesterday. 4. Hyponatremia-has had since admission, sodium  up to 136 this am.  5. Creatinine slightly increased to 1.35. On Ramipril  and Toradol . Will stop Ramipril  for now. If creatinine continues to increase, will stop Toradol . Re check in am 6. Anemia-H and H this am decreased to 7.9 and 24.2 7. On Lovenox  for DVT prophylaxis 8. ID-WBC this am 10,100. He was on 2 different antibiotics at home and per patient, finished both (for 5 days each). Previously treated for Enterobacter and Enterococcus (GB fossa abscess, s/p Xi cholecystectomy and drain placement by IR). Gram stain of right pleural fluid showed no organisms and culture shows no growth < 24 hours. Gram stain of right pleural peel showed rare gram positive cocci and culture showed no growth < 24 hours.  Donielle M ZimmermanPA-C 10/16/2023,7:06 AM  Patient seen and examined, agree with above Will dc chest tubes Would hold off on apixaban  until tomorrow  [name], MD [facility] [phone]

## 2023-10-16 NOTE — Progress Notes (Signed)
 Mobility Specialist Progress Note;   10/16/23 1042  Mobility  Activity Ambulated with assistance in hallway  Level of Assistance Standby assist, set-up cues, supervision of patient - no hands on  Assistive Device Front wheel walker  Distance Ambulated (ft) 400 ft  Activity Response Tolerated well  Mobility Referral Yes  Mobility visit 1 Mobility  Mobility Specialist Start Time (ACUTE ONLY) 1042  Mobility Specialist Stop Time (ACUTE ONLY) 1058  Mobility Specialist Time Calculation (min) (ACUTE ONLY) 16 min   Pt agreeable to mobility. Required no physical assistance during ambulation, SV. VSS on RA. No c/o when asked. Pt returned back to bed with all needs met, call bell in reach.   Janit Meline Mobility Specialist Please contact via SecureChat or [employer] [phone]

## 2023-10-17 ENCOUNTER — Inpatient Hospital Stay (HOSPITAL_COMMUNITY)

## 2023-10-17 ENCOUNTER — Other Ambulatory Visit (HOSPITAL_COMMUNITY): Payer: Self-pay

## 2023-10-17 DIAGNOSIS — J90 Pleural effusion, not elsewhere classified: Secondary | ICD-10-CM | POA: Diagnosis not present

## 2023-10-17 LAB — GLUCOSE, CAPILLARY
Glucose-Capillary: 90 mg/dL (ref 70–99)
Glucose-Capillary: 63 mg/dL — ABNORMAL LOW (ref 70–99)
Glucose-Capillary: 89 mg/dL (ref 70–99)

## 2023-10-17 LAB — CBC
HCT: 23.7 % — ABNORMAL LOW (ref 39.0–52.0)
Hemoglobin: 7.9 g/dL — ABNORMAL LOW (ref 13.0–17.0)
MCH: 29.2 pg (ref 26.0–34.0)
MCHC: 33.3 g/dL (ref 30.0–36.0)
MCV: 87.5 fL (ref 80.0–100.0)
Platelets: 437 10*3/uL — ABNORMAL HIGH (ref 150–400)
RBC: 2.71 MIL/uL — ABNORMAL LOW (ref 4.22–5.81)
RDW: 13.5 % (ref 11.5–15.5)
WBC: 7.6 10*3/uL (ref 4.0–10.5)
nRBC: 0 % (ref 0.0–0.2)

## 2023-10-17 LAB — BASIC METABOLIC PANEL WITH GFR
Anion gap: 9 (ref 5–15)
BUN: 13 mg/dL (ref 8–23)
CO2: 29 mmol/L (ref 22–32)
Calcium: 7.9 mg/dL — ABNORMAL LOW (ref 8.9–10.3)
Chloride: 98 mmol/L (ref 98–111)
Creatinine, Ser: 0.85 mg/dL (ref 0.61–1.24)
GFR, Estimated: >60 mL/min (ref >60)
Glucose, Bld: 108 mg/dL — ABNORMAL HIGH (ref 70–99)
Potassium: 3.4 mmol/L — ABNORMAL LOW (ref 3.5–5.1)
Sodium: 136 mmol/L (ref 135–145)

## 2023-10-17 LAB — MAGNESIUM: Magnesium: 1.8 mg/dL (ref 1.7–2.4)

## 2023-10-17 MED ORDER — ENSURE ENLIVE PO LIQD: 237.0000 mL | Freq: Two times a day (BID) | ORAL | Status: DC

## 2023-10-17 MED ORDER — POTASSIUM CHLORIDE CRYS ER 20 MEQ PO TBCR
30.0000 meq | EXTENDED_RELEASE_TABLET | Freq: Two times a day (BID) | ORAL | Status: DC
  Administered 2023-10-17: 30 meq via ORAL
  Filled 2023-10-17: qty 1

## 2023-10-17 MED ORDER — TRAMADOL HCL 50 MG PO TABS: 50.0000 mg | ORAL_TABLET | Freq: Four times a day (QID) | ORAL | 0 refills | Status: DC | PRN

## 2023-10-17 NOTE — Progress Notes (Signed)
 HPI:  Patient returns for routine postoperative follow-up having undergone Right VATS with drainage of pleural effusion and decortication on 6/1. The patient's early postoperative recovery while in the hospital was unremarkable.  Since hospital discharge the patient reports he is doing okay.  He states that he is just very weak and has lost a lot of weight.  He is anxious to increase his activity level and get back to his baseline level prior to everything he has been through.  Current Outpatient Medications  Medication Sig Dispense Refill   Abatacept  (ORENCIA  IV) Inject 750 mg into the vein every 28 (twenty-eight) days. (Patient not taking: Reported on 10/08/2023)     albuterol  (PROVENTIL ) (2.5 MG/3ML) 0.083% nebulizer solution Take 3 mLs (2.5 mg total) by nebulization every 6 (six) hours as needed for wheezing or shortness of breath. 360 mL 1   albuterol  (VENTOLIN  HFA) 108 (90 Base) MCG/ACT inhaler Inhale 2 puffs into the lungs every 6 (six) hours as needed for wheezing or shortness of breath. (Patient not taking: Reported on 10/08/2023) 18 g 1   amLODipine  (NORVASC ) 5 MG tablet Take 1 tablet (5 mg total) by mouth daily. (Patient taking differently: Take 5 mg by mouth in the morning.) 90 tablet 3   apixaban  (ELIQUIS ) 5 MG TABS tablet Take 1 tablet (5 mg total) by mouth 2 (two) times daily. 60 tablet 6   ascorbic acid  (VITAMIN C ) 500 MG tablet Take 500 mg by mouth in the morning.     calcium carbonate (OSCAL) 1500 (600 Ca) MG TABS tablet Take 600 mg of elemental calcium by mouth in the morning.     calcium carbonate (TUMS - DOSED IN MG ELEMENTAL CALCIUM) 500 MG chewable tablet Chew 1 tablet by mouth as needed for indigestion or heartburn.     cholecalciferol (VITAMIN D3) 25 MCG (1000 UNIT) tablet Take 1,000 Units by mouth in the morning.     feeding supplement (ENSURE ENLIVE / ENSURE PLUS) LIQD Take 237 mLs by mouth 2 (two) times daily between meals.     Insulin  Aspart, w/Niacinamide, (FIASP ) 100  UNIT/ML SOLN 77 Units continuous. Insulin  pump     insulin  glargine (LANTUS ) 100 UNIT/ML injection Inject 16 Units into the skin as needed (If pump goes out).     KRILL OIL PO Take 400 mg by mouth in the morning.     loratadine  (CLARITIN ) 10 MG tablet Take 10 mg by mouth in the morning.     Multiple Vitamin (MULTIVITAMIN WITH MINERALS) TABS Take 1 tablet by mouth in the morning. Centrum     ondansetron  (ZOFRAN -ODT) 4 MG disintegrating tablet Take 1 tablet (4 mg total) by mouth every 8 (eight) hours as needed for nausea or vomiting. 12 tablet 0   ONE TOUCH ULTRA TEST test strip 1 each daily. Pt says 2-3 times daily  3   pantoprazole  (PROTONIX ) 40 MG tablet TAKE 1 TABLET BY MOUTH DAILY (Patient taking differently: Take 40 mg by mouth in the morning.) 90 tablet 3   Potassium 99 MG TABS Take 99 mg by mouth in the morning.     pravastatin  (PRAVACHOL ) 40 MG tablet Take 40 mg by mouth in the morning. (Patient not taking: Reported on 10/08/2023)     pyridOXINE (VITAMIN B6) 50 MG tablet Take 50 mg by mouth in the morning.     ramipril  (ALTACE ) 5 MG capsule Take 1 capsule (5 mg total) by mouth daily. (Patient taking differently: Take 5 mg by mouth in the morning.) 90 capsule  3   sodium chloride  flush 0.9 % SOLN injection Please flush drain with 5 mL normal saline from pre-filled syringe once daily. 300 mL 1   traMADol  (ULTRAM ) 50 MG tablet Take 1 tablet (50 mg total) by mouth every 6 (six) hours as needed for moderate pain (pain score 4-6). 30 tablet 0   vitamin E  400 UNIT capsule Take 400 Units by mouth in the morning.     zinc gluconate 50 MG tablet Take 50 mg by mouth in the morning.     No current facility-administered medications for this visit.   Facility-Administered Medications Ordered in Other Visits  Medication Dose Route Frequency Provider Last Rate Last Admin   acetaminophen  (TYLENOL ) tablet 1,000 mg  1,000 mg Oral Q6H Stehler, Bailey C, PA-C   1,000 mg at 10/17/23 6045   Or   acetaminophen   (TYLENOL ) 160 MG/5ML solution 1,000 mg  1,000 mg Oral Q6H Stehler, Bailey C, PA-C       acetaminophen  (TYLENOL ) tablet 650 mg  650 mg Oral Q6H PRN Randa Burton, PA-C       Or   acetaminophen  (TYLENOL ) suppository 650 mg  650 mg Rectal Q6H PRN Stehler, Barba Bonnet, PA-C       albuterol  (PROVENTIL ) (2.5 MG/3ML) 0.083% nebulizer solution 3 mL  3 mL Inhalation Q6H PRN Stehler, Bailey C, PA-C       alum & mag hydroxide-simeth (MAALOX/MYLANTA) 200-200-20 MG/5ML suspension 30 mL  30 mL Oral Q6H PRN Debroah Fanning C, PA-C   30 mL at 10/15/23 2005   amLODipine  (NORVASC ) tablet 5 mg  5 mg Oral Daily Stehler, Bailey C, PA-C   5 mg at 10/17/23 0930   atropine  1 MG/10ML injection 0.5 mg  0.5 mg Intravenous PRN Randa Burton, PA-C       bisacodyl  (DULCOLAX) EC tablet 10 mg  10 mg Oral Daily Debroah Fanning C, PA-C   10 mg at 10/16/23 0840   dextrose  50 % solution 50 mL  50 mL Intravenous PRN Debroah Fanning C, PA-C   50 mL at 10/10/23 0751   enoxaparin  (LOVENOX ) injection 40 mg  40 mg Subcutaneous Daily Randa Burton, PA-C   40 mg at 10/17/23 0929   feeding supplement (ENSURE ENLIVE / ENSURE PLUS) liquid 237 mL  237 mL Oral BID BM Stehler, Bailey C, PA-C   237 mL at 10/11/23 1440   fentaNYL  (SUBLIMAZE ) injection 25 mcg  25 mcg Intravenous BID PRN Randa Burton, PA-C   25 mcg at 10/16/23 2156   gabapentin  (NEURONTIN ) capsule 300 mg  300 mg Oral QHS Stehler, Bailey C, PA-C   300 mg at 10/16/23 2047   Followed by   gabapentin  (NEURONTIN ) capsule 300 mg  300 mg Oral BID Stehler, Bailey C, PA-C       insulin  pump   Subcutaneous TID WC, HS, 0200 Pokhrel, Laxman, MD   Given at 10/16/23 2222   methocarbamol  (ROBAXIN ) tablet 500 mg  500 mg Oral Q6H PRN Debroah Fanning C, PA-C   500 mg at 10/17/23 0103   ondansetron  (ZOFRAN ) injection 4 mg  4 mg Intravenous Q6H PRN Debroah Fanning C, PA-C   4 mg at 10/11/23 1613   pantoprazole  (PROTONIX ) EC tablet 40 mg  40 mg Oral Daily Randa Burton, PA-C   40 mg at  10/17/23 0929   polyethylene glycol (MIRALAX  / GLYCOLAX ) packet 17 g  17 g Oral Daily PRN Debroah Fanning C, PA-C       potassium  chloride (KLOR-CON  M) CR tablet 30 mEq  30 mEq Oral BID Zimmerman, Donielle M, PA-C   30 mEq at 10/17/23 0930   pravastatin  (PRAVACHOL ) tablet 40 mg  40 mg Oral Daily Randa Burton, PA-C   40 mg at 10/17/23 0930   senna-docusate (Senokot-S) tablet 1 tablet  1 tablet Oral QHS Randa Burton, PA-C   1 tablet at 10/14/23 2124   sodium chloride  flush (NS) 0.9 % injection 3 mL  3 mL Intravenous Q12H Debroah Fanning C, PA-C   3 mL at 10/17/23 4782   traMADol  (ULTRAM ) tablet 50 mg  50 mg Oral Q6H PRN Randa Burton, PA-C   50 mg at 10/17/23 0103    Physical Exam:  BP 133/65 (BP Location: Right Arm, Patient Position: Sitting, Cuff Size: Normal)   Pulse 62   Resp 20   Ht 5' 6 (1.676 m)   Wt 183 lb 1.6 oz (83.1 kg)   SpO2 96% Comment: RA  BMI 29.55 kg/m   Gen: NAd Heart: RRR Lungs: diminished right base Ext: no edema Incisions: well healed, sutures in place removed without difficulty  Diagnostic Tests:  CXR: right basilar scarring, pleural effusion  A/P:  S/P VATS with decortication/drainage of effusion due to issues post choleycystectomy  Overall  making progress.  He [place] resume driving, increase activity as this time.  They have requested home therapy which has been ordered by primary physician  RTC prn   Gates Kasal, PA-C [facility] [phone]  Agree with above PA student Eddie Whites note.. changes per my note  Sherisa Gilvin, PA-C 1:23 PM

## 2023-10-17 NOTE — Discharge Summary (Signed)
 Physician Discharge Summary  Joel [name] ZOX:[id number] DOB: 10-23-1945 DOA: 10/08/2023  PCP: [name], MD  Admit date: 10/08/2023 Discharge date: 10/17/2023  Admitted From: Home  Discharge disposition: Home    Recommendations for Outpatient Follow-Up:   Follow up with your primary care provider in one week.  Check CBC, BMP, magnesium in the next visit Follow-up with CT surgery on 10/30/2023. Follow-up with interventional radiology for drain management, office to schedule and coordinate.   Discharge Diagnosis:   Principal Problem:   Recurrent pleural effusion on right Active Problems:   CAD (coronary artery disease)   Essential hypertension   Rheumatoid arthritis involving multiple sites with positive rheumatoid factor (HCC)   Gastroesophageal reflux disease   Type 2 diabetes mellitus with other specified complication (HCC)   Obesity   Asthmatic bronchitis , chronic (HCC)   Atrial fibrillation (HCC)   Hyperlipidemia associated with type 2 diabetes mellitus (HCC)    Discharge Condition: Improved.  Diet recommendation: Low sodium, heart healthy.    Wound care: None.  Code status: Full.   History of Present Illness:   Joel [name] is a 78 y.o. male with past medical history significant of hypertension, hyperlipidemia, diabetes, GERD, CAD, atrial fibrillation, bradycardia, obesity, RA, asthma presented to hospital with recurrent shortness of breath and right-sided chest pain.  Of note, patient had initially presented to [facility] and underwent cholecystectomy on 08/29/2023 and had a follow-up CT scan which showed fluid collection.  Patient presented on 5/9 with continued abdominal pain and was noted to have gastric outlet obstruction with concern for abscess.  He was then admitted and was seen by general surgery and interventional radiology and had IR placed drain which showed Enterococcus and Enterobacter species and had received IV Zosyn .  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  Drain was also reportedly repositioned on 5/23.  Patient has had 2 thoracocentesis for this symptoms.  At this time, patient presented with shortness of breath worse on exertion with right-sided chest pain.  In the ED, patient was notable for hyponatremia with sodium of 131.  Leukocytosis at 12.9.  Case was discussed with interventional radiology and was admitted to hospital for further evaluation and treatment.    Hospital Course:   Following conditions were addressed during hospitalization as listed below,  Recurrent right pleural effusion Status post cholecystectomy History of intra-abdominal abscess status post drain placement History of recurrent right-sided pleural effusion.  History of drain placement for abdominal abscess following cholecystectomy.  IR for the patient during hospitalization and previous perihepatic drain has been removed and new drain tube has been placed at new site with good drainage at this time.  She will be continued on discharge.  No fistulous communication was noted when drains injection was done.  IR has seen the patient and recommended follow-up in IR clinic in 1 to 2 weeks.    CT scan of the chest abdomen pelvis done on 10/09/2023 showed interval increase in size of loculated at least small volume right pleural effusion with hilar and mediastinal lymphadenopathy.  Stable to slightly increased size of gallbladder fossa collection of free fluid and gas.    Culture from previous pleural fluid tapping on 5/21 with no growth.      Pulmonary was consulted due to loculated effusion and subsequently CT surgery evaluation was recommended.  Patient then underwent went VATS procedure and chest tube placement which  has been  removed since 10/16/2023..  CT surgery has seen the patient and patient has been considered stable for disposition at this time.    Hypertension - Continue home amlodipine  and ramipril .   Hyperlipidemia Continue atorvastatin   Diabetes melitis type I with hyperglycemia.. On insulin  pump at home.  Will be resumed at discharge. GERD - Continue PPI   CAD - Continue home amlodipine .   Atrial fibrillation Will be resumed on Eliquis .    Rheumatoid arthritis - On abatacept  outpatient.   Asthma - Continue home as needed albuterol .  Currently compensated.   Left ankle pain   seen by orthopedics.  Has ankle splint in place at this time.  X-ray shows degenerative tibiotalar spurring.  Continue on discharge.  Disposition.  At this time, patient is stable for disposition home with outpatient PCP, interventional radiology and CT surgery follow-up.  Medical Consultants:   Interventional radiology General Surgery PCCM CT surgery  Procedures:    Left ankle splint Abdominal drain placement 10/11/2023 by interventional radiology Video-assisted thoracoscopy with drainage of pleural effusion and visceral and parietal pleura decortication on 10/14/2023.   Subjective:   Today, patient was seen and examined at bedside.  Denies any nausea vomiting fever chills shortness of breath dyspnea.  Wants to go home.  Discharge Exam:   Vitals:   10/17/23 0351 10/17/23 0724  BP: (!) 125/59 133/65  Pulse: 62 65  Resp: 17 19  Temp: (!) 97.5 F (36.4 C) (!) 97.5 F (36.4 C)  SpO2: 91% 96%   Vitals:   10/16/23 2246 10/17/23 0351 10/17/23 0709 10/17/23 0724  BP: (!) 145/64 (!) 125/59  133/65  Pulse: 68 62  65  Resp: 19 17  19   Temp: 98.7 F (37.1 C) (!) 97.5 F (36.4 C)  (!) 97.5 F (36.4 C)  TempSrc: Oral Oral  Oral  SpO2: 95% 91%  96%  Weight:   81.7 kg   Height:        General: Alert awake, Communicative, not in obvious distress HENT: pupils equally reacting to light,  No scleral pallor or icterus noted. Oral mucosa is moist.  Chest:   Diminished breath sounds bilaterally.  CVS: S1 &S2 heard. No murmur.   Regular rate and rhythm. Abdomen: Soft, nontender, nondistended.  Bowel sounds are heard.  Abdominal drain in place. Extremities: No cyanosis, clubbing or edema.  Peripheral pulses are palpable. Psych: Alert, awake and oriented, normal mood CNS:  No cranial nerve deficits.  Power equal in all extremities.   Skin: Warm and dry.  No rashes noted.  The results of significant diagnostics from this hospitalization (including imaging, microbiology, ancillary and laboratory) are listed below for reference.     Diagnostic Studies:   IR US  CHEST Result Date: 10/10/2023 INDICATION: 78 year old male with history of right pleural effusion presents with shortness of breast. Request for therapeutic and diagnostic thoracentesis. EXAM: CHEST ULTRASOUND COMPARISON:  Chest CT 10/05/2023 FINDINGS: Bilateral pleural space was visualized with ultrasound which showed no pleural effusion in the left and very small, loculated pleural effusion in the right. There was no safe window for ultrasound-guided thoracentesis. IMPRESSION: Small loculated right pleural effusion, no safe window for ultrasound-guided thoracentesis. Thoracentesis was not performed. Performed by: Aimee Han, PA-C Electronically Signed   By: Elene Griffes M.D.   On: 10/10/2023 11:18   CT CHEST ABDOMEN PELVIS W CONTRAST Result Date: 10/09/2023 CLINICAL DATA:  SOB, GB fossa drain in place EXAM: CT CHEST, ABDOMEN, AND PELVIS WITH CONTRAST TECHNIQUE: Multidetector CT  imaging of the chest, abdomen and pelvis was performed following the standard protocol during bolus administration of intravenous contrast. RADIATION DOSE REDUCTION: This exam was performed according to the departmental dose-optimization program which includes automated exposure control, adjustment of the mA and/or kV according to patient size and/or use of iterative reconstruction technique. CONTRAST:  75mL OMNIPAQUE  IOHEXOL  350 MG/ML SOLN COMPARISON:  CT chest abdomen pelvis 10/05/2023, CT abdomen  pelvis 09/21/2023 FINDINGS: CT CHEST FINDINGS Cardiovascular: Normal heart size. No significant pericardial effusion. The thoracic aorta is normal in caliber. Mild atherosclerotic plaque of the thoracic aorta. At least 3 vessel coronary artery calcifications. Mediastinum/Nodes: Stable mediastinal and right hilar lymphadenopathy with as an example a 1.2 cm precarinal lymph node (3:20) and a 1.1 cm right hilar lymph node (3:20). No left hilar lymph node. Calcified right hilar lymph nodes suggestive of granulomatous disease. No axillary lymph nodes. Thyroid  gland, trachea, and esophagus demonstrate no significant findings. Lungs/Pleura: Punctate calcification in the right lower lobe suggestive of granulomatous disease. Left lower lobe atelectasis. No focal consolidation. No pulmonary nodule. No pulmonary mass. Interval increase in size of a loculated at least small volume right pleural effusion. Almost complete collapse of the right lower lobe. No left pleural effusion. No pneumothorax. Musculoskeletal: No chest wall abnormality. No suspicious lytic or blastic osseous lesions. No acute displaced fracture. Multilevel degenerative changes of the spine. CT ABDOMEN PELVIS FINDINGS Hepatobiliary: No focal liver abnormality. Status post cholecystectomy. Grossly stable to slightly increased in size 3.7 x 2.2 x 4.5 cm gallbladder fossa collection of free fluid and foci of gas (6:103, 3:54). Trace associated gallbladder fossa/porta hepatics fat stranding. No biliary dilatation. Pancreas: No focal lesion. Normal pancreatic contour. No surrounding inflammatory changes. No main pancreatic ductal dilatation. Spleen: Normal in size without focal abnormality. Adrenals/Urinary Tract: No adrenal nodule bilaterally. Bilateral kidneys enhance symmetrically. Fluid density lesions within the kidneys likely represents a simple renal cysts. Simple renal cysts, in the absence of clinically indicated signs/symptoms, require no independent  follow-up. Subcentimeter hypodensities are too small to characterize-no further follow-up indicated. No hydronephrosis. No hydroureter. No nephroureterolithiasis bilaterally. The urinary bladder is unremarkable. On delayed imaging, there is no urothelial wall thickening and there are no filling defects in the opacified portions of the bilateral collecting systems or ureters. Stomach/Bowel: Stomach is within normal limits. No evidence of bowel wall thickening or dilatation. Colonic diverticulosis. Appendix appears normal. Vascular/Lymphatic: The main portal, splenic, superior mesenteric veins are patent. No abdominal aorta or iliac aneurysm. Mild atherosclerotic plaque of the aorta and its branches. No abdominal, pelvic, or inguinal lymphadenopathy. Reproductive: Prostate is unremarkable. Other: No intraperitoneal free fluid. No intraperitoneal free gas. No organized fluid collection. Musculoskeletal: No abdominal wall hernia or abnormality. No suspicious lytic or blastic osseous lesions. No acute displaced fracture. Multilevel degenerative changes of the spine. IMPRESSION: 1. Interval increase in size of a loculated at least small volume right pleural effusion. Almost complete collapse of the right lower lobe. 2. Likely reactive in etiology stable right hilar and mediastinal lymphadenopathy. Recommend attention on follow-up. 3. Grossly stable to slightly increased in size 3.7 x 2.2 x 4.5 cm gallbladder fossa collection of free fluid and foci of gas. 4. Colonic diverticulosis with no acute diverticulitis. 5. Sequelae of prior granulomatous disease. 6.  Aortic Atherosclerosis (ICD10-I70.0). Electronically Signed   By: Morgane  Naveau M.D.   On: 10/09/2023 20:30   DG Ankle 2 Views Left Result Date: 10/09/2023 CLINICAL DATA:  Left ankle pain. EXAM: LEFT ANKLE - 2 VIEW COMPARISON:  None Available. FINDINGS: There is no evidence of fracture, dislocation, or joint effusion. Ankle mortise is preserved. Mild tibial talar  spurring. Moderate plantar calcaneal spur. No erosions or suspicious bone lesion. No focal soft tissue abnormality. IMPRESSION: 1. Mild degenerative tibiotalar spurring. 2. Moderate plantar calcaneal spur. Electronically Signed   By: Chadwick Colonel M.D.   On: 10/09/2023 15:43   DG Chest 2 View Result Date: 10/08/2023 EXAM: 2 VIEW(S) XRAY OF THE CHEST 10/08/2023 02:27:00 PM COMPARISON: 1 view chest x-ray 10/05/2023 CT of the chest 10/05/2023. CLINICAL HISTORY: Shortness of breath. SOB, hx of bradycardia, hiatal hernia. FINDINGS: LUNGS AND PLEURA: Progressive loculated right pleural effusion and right lower lobe airspace disease are present. Small left pleural effusion and atelectasis are now present. The lung volumes are low. HEART AND MEDIASTINUM: No acute abnormality of the cardiac and mediastinal silhouettes. BONES AND SOFT TISSUES: No acute osseous abnormality. IMPRESSION: 1. Progressive loculated right pleural effusion and right lower lobe airspace disease. 2. New small left pleural effusion and atelectasis. 3. Low lung volumes. Electronically signed by: Audree Leas MD 10/08/2023 02:38 PM EDT RP Workstation: [id number]     Labs:   Basic Metabolic Panel: Recent Labs  Lab 10/14/23 0438 10/15/23 0241 10/15/23 0845 10/16/23 0244 10/17/23 0247  NA 133* 129* 131* 136 136  K 3.3* 4.1 3.8 3.7 3.4*  CL 95* 93* 91* 97* 98  CO2 28 25 28 31 29   GLUCOSE 130* 339* 283* 108* 108*  BUN 10 23 24* 24* 13  CREATININE 0.80 1.35* 1.40* 0.95 0.85  CALCIUM 8.0* 8.4* 8.6* 8.3* 7.9*  MG 1.9  --   --   --  1.8   GFR Estimated Creatinine Clearance: 74.2 mL/min (by C-G formula based on SCr of 0.85 mg/dL). Liver Function Tests: Recent Labs  Lab 10/16/23 0244  AST 23  ALT 16  ALKPHOS 199*  BILITOT 0.4  PROT 4.9*  ALBUMIN 1.9*   No results for input(s): "LIPASE", "AMYLASE" in the last 168 hours. No results for input(s): "AMMONIA" in the last 168 hours. Coagulation profile Recent Labs  Lab  10/13/23 1142  INR 1.1    CBC: Recent Labs  Lab 10/13/23 1142 10/14/23 0438 10/15/23 0241 10/16/23 0244 10/17/23 0247  WBC 9.7 7.2 9.7 10.1 7.6  HGB 11.6* 10.3* 8.7* 7.9* 7.9*  HCT 35.4* 30.5* 26.4* 24.2* 23.7*  MCV 87.6 85.9 87.1 87.4 87.5  PLT 585* 490* 475* 459* 437*   Cardiac Enzymes: No results for input(s): "CKTOTAL", "CKMB", "CKMBINDEX", "TROPONINI" in the last 168 hours. BNP: Invalid input(s): "POCBNP" CBG: Recent Labs  Lab 10/14/23 2358 10/15/23 0653 10/16/23 2245 10/17/23 0633 10/17/23 0704  GLUCAP 382* 279* 90 63* 89   D-Dimer No results for input(s): "DDIMER" in the last 72 hours. Hgb A1c No results for input(s): "HGBA1C" in the last 72 hours. Lipid Profile No results for input(s): "CHOL", "HDL", "LDLCALC", "TRIG", "CHOLHDL", "LDLDIRECT" in the last 72 hours. Thyroid  function studies No results for input(s): "TSH", "T4TOTAL", "T3FREE", "THYROIDAB" in the last 72 hours.  Invalid input(s): "FREET3" Anemia work up No results for input(s): "VITAMINB12", "FOLATE", "FERRITIN", "TIBC", "IRON", "RETICCTPCT" in the last 72 hours. Microbiology Recent Results (from the past 240 hours)  Surgical pcr screen     Status: None   Collection Time: 10/13/23 11:38 AM   Specimen: Nasal Mucosa; Nasal Swab  Result Value Ref Range Status   MRSA, PCR NEGATIVE NEGATIVE Final   Staphylococcus aureus NEGATIVE NEGATIVE Final    Comment: (NOTE) The Xpert SA  Assay (FDA approved for NASAL specimens in patients 21 years of age and older), is one component of a comprehensive surveillance program. It is not intended to diagnose infection nor to guide or monitor treatment. Performed at [facility], [address], [place], [place] [address]   Aerobic/Anaerobic Culture w Gram Stain (surgical/deep wound)     Status: None (Preliminary result)   Collection Time: 10/14/23  8:39 AM   Specimen: Pleural, Right; Body Fluid  Result Value Ref Range Status   Specimen Description  PLEURAL  Final   Special Requests RIGHT PLEURAL FLUID  Final   Gram Stain RARE WBC SEEN NO ORGANISMS SEEN   Final   Culture   Final    NO GROWTH 3 DAYS NO ANAEROBES ISOLATED; CULTURE IN PROGRESS FOR 5 DAYS Performed at [facility], [address] [place], [place] [address]    Report Status PENDING  Incomplete  Aerobic/Anaerobic Culture w Gram Stain (surgical/deep wound)     Status: None (Preliminary result)   Collection Time: 10/14/23  9:00 AM   Specimen: Soft Tissue, Other  Result Value Ref Range Status   Specimen Description TISSUE  Final   Special Requests RIGHT PLEURAL PEEL  Final   Gram Stain RARE WBC SEEN RARE GRAM POSITIVE COCCI   Final   Culture   Final    NO GROWTH 3 DAYS NO ANAEROBES ISOLATED; CULTURE IN PROGRESS FOR 5 DAYS Performed at [facility], [address] [place], [place] [address]    Report Status PENDING  Incomplete     Discharge Instructions:   Discharge Instructions     Call MD for:  difficulty breathing, headache or visual disturbances   Complete by: As directed    Call MD for:  severe uncontrolled pain   Complete by: As directed    Call MD for:  temperature >100.4   Complete by: As directed    Diet - low sodium heart healthy   Complete by: As directed    Discharge instructions   Complete by: As directed    Follow-up with your primary care provider in 1 week.  Check blood work at that time.  Follow-up with CT surgery as has been scheduled on 10/30/2023.  Seek medical attention for worsening symptoms.  Follow-up with interventional radiology as scheduled by the clinic for drain tube management.   Increase activity slowly   Complete by: As directed    No wound care   Complete by: As directed       Allergies as of 10/17/2023       Reactions   Codeine Swelling   Lipitor [atorvastatin] Other (See Comments)   Muscle aches    Invokana [canagliflozin] Other (See Comments)   Stomach upset    Losartan Nausea And Vomiting    Roxicodone  [oxycodone ] Itching   Vicodin [hydrocodone -acetaminophen ] Itching        Medication List     STOP taking these medications    amoxicillin -clavulanate 875-125 MG tablet Commonly known as: AUGMENTIN    HYDROmorphone  2 MG tablet Commonly known as: Dilaudid    methocarbamol  500 MG tablet Commonly known as: ROBAXIN        TAKE these medications    albuterol  108 (90 Base) MCG/ACT inhaler Commonly known as: VENTOLIN  HFA Inhale 2 puffs into the lungs every 6 (six) hours as needed for wheezing or shortness of breath.   albuterol  (2.5 MG/3ML) 0.083% nebulizer solution Commonly known as: PROVENTIL  Take 3 mLs (2.5 mg total) by nebulization  every 6 (six) hours as needed for wheezing or shortness of breath.   amLODipine  5 MG tablet Commonly known as: NORVASC  Take 1 tablet (5 mg total) by mouth daily. What changed: when to take this   apixaban  5 MG Tabs tablet Commonly known as: ELIQUIS  Take 1 tablet (5 mg total) by mouth 2 (two) times daily.   ascorbic acid  500 MG tablet Commonly known as: VITAMIN C  Take 500 mg by mouth in the morning.   calcium carbonate 1500 (600 Ca) MG Tabs tablet Commonly known as: OSCAL Take 600 mg of elemental calcium by mouth in the morning.   calcium carbonate 500 MG chewable tablet Commonly known as: TUMS - dosed in mg elemental calcium Chew 1 tablet by mouth as needed for indigestion or heartburn.   cholecalciferol 25 MCG (1000 UNIT) tablet Commonly known as: VITAMIN D3 Take 1,000 Units by mouth in the morning.   feeding supplement Liqd Take 237 mLs by mouth 2 (two) times daily between meals.   Fiasp  100 UNIT/ML Soln Generic drug: Insulin  Aspart (w/Niacinamide) 77 Units continuous. Insulin  pump   KRILL OIL PO Take 400 mg by mouth in the morning.   Lantus  100 UNIT/ML injection Generic drug: insulin  glargine Inject 16 Units into the skin as needed (If pump goes out).   loratadine  10 MG tablet Commonly known as: CLARITIN  Take  10 mg by mouth in the morning.   multivitamin with minerals Tabs tablet Take 1 tablet by mouth in the morning. Centrum   ondansetron  4 MG disintegrating tablet Commonly known as: ZOFRAN -ODT Take 1 tablet (4 mg total) by mouth every 8 (eight) hours as needed for nausea or vomiting.   ONE TOUCH ULTRA TEST test strip Generic drug: glucose blood 1 each daily. Pt says 2-3 times daily   ORENCIA  IV Inject 750 mg into the vein every 28 (twenty-eight) days.   pantoprazole  40 MG tablet Commonly known as: PROTONIX  TAKE 1 TABLET BY MOUTH DAILY What changed: when to take this   Potassium 99 MG Tabs Take 99 mg by mouth in the morning.   pravastatin  40 MG tablet Commonly known as: PRAVACHOL  Take 40 mg by mouth in the morning.   pyridOXINE 50 MG tablet Commonly known as: VITAMIN B6 Take 50 mg by mouth in the morning.   ramipril  5 MG capsule Commonly known as: ALTACE  Take 1 capsule (5 mg total) by mouth daily. What changed: when to take this   sodium chloride  flush 0.9 % Soln injection Please flush drain with 5 mL normal saline from pre-filled syringe once daily.   traMADol  50 MG tablet Commonly known as: ULTRAM  Take 1 tablet (50 mg total) by mouth every 6 (six) hours as needed for moderate pain (pain score 4-6).   vitamin E  180 MG (400 UNITS) capsule Take 400 Units by mouth in the morning.   zinc gluconate 50 MG tablet Take 50 mg by mouth in the morning.        Follow-up Information     [facility] at [facility] A Dept. of [place]. [employer] [employer]. Go on 10/30/2023.   Specialty: Radiology Why: Please arrive by 11:30 am  in order to have PA/LAT CXR PRIOR to office appointment Contact information: [address] [place]  [phone]        [facility] [place]. Go on 10/30/2023.   Specialty: Cardiothoracic Surgery Why: Appointment time is at 12:30 pm Contact information: [address] [place]   [phone]  [name], MD Follow up in 1 week(s).   Specialty: Internal Medicine Contact information: [address] [place] [address] [phone]         [facility] .   Specialty: Radiology Contact information: [address] [place]  [address] [phone]                 Time coordinating discharge: 39 minutes  Signed:  Hurley Blevins  [facility] 10/17/2023, 11:55 AM

## 2023-10-17 NOTE — Plan of Care (Signed)
  Problem: Education: Goal: Knowledge of General Education information will improve Description: Including pain rating scale, medication(s)/side effects and non-pharmacologic comfort measures Outcome: Progressing   Problem: Clinical Measurements: Goal: Respiratory complications will improve Outcome: Progressing   Problem: Activity: Goal: Risk for activity intolerance will decrease Outcome: Progressing   Problem: Pain Managment: Goal: General experience of comfort will improve and/or be controlled Outcome: Progressing   Problem: Skin Integrity: Goal: Risk for impaired skin integrity will decrease Outcome: Progressing

## 2023-10-17 NOTE — TOC Transition Note (Signed)
 Transition of Care [facility]) - Discharge Note   Patient Details  Name: Joel Viney Schemm Sr. MRN: [id number] Date of Birth: [birth year]  Transition of Care [facility]) CM/SW Contact:  Jeani Mill, RN Phone Number: 10/17/2023, 9:49 AM   Clinical Narrative:    Joel Patches Reining Sr. is stable to discharge home.  No TOC needs at this time.    Final next level of care: Home/Self Care Barriers to Discharge: Barriers Resolved   Patient Goals and CMS Choice Patient states their goals for this hospitalization and ongoing recovery are:: return home          Discharge Placement                 Home      Discharge Plan and Services Additional resources added to the After Visit Summary for                                       Social Drivers of Health (SDOH) Interventions SDOH Screenings   Food Insecurity: No Food Insecurity (10/08/2023)  Housing: Low Risk  (10/08/2023)  Transportation Needs: No Transportation Needs (10/08/2023)  Utilities: Not At Risk (10/08/2023)  Depression (PHQ2-9): Low Risk  (09/28/2023)  Social Connections: Moderately Isolated (10/08/2023)  Tobacco Use: Medium Risk (10/14/2023)     Readmission Risk Interventions    10/17/2023    9:47 AM 10/09/2023    2:53 PM 09/24/2023    3:25 PM  Readmission Risk Prevention Plan  Post Dischage Appt   Complete  Medication Screening   Complete  Transportation Screening Complete Complete Complete  PCP or Specialist Appt within 3-5 Days Complete Complete   HRI or Home Care Consult  Complete   Social Work Consult for Recovery Care Planning/Counseling Complete Complete   Palliative Care Screening Complete Complete   Medication Review Oceanographer) Complete Complete

## 2023-10-17 NOTE — Progress Notes (Addendum)
      [address]       [place] [address]             [id number]       3 Days Post-Op Procedure(s) (LRB): VIDEO ASSISTED THORACOSCOPY (VATS)/DECORTICATION (Right)  Subjective: Patient feels good. He hopes to go home.  Objective: Vital signs in last 24 hours: Temp:  [97.5 F (36.4 C)-98.7 F (37.1 C)] 97.5 F (36.4 C) (06/04 0351) Pulse Rate:  [60-71] 62 (06/04 0351) Cardiac Rhythm: Normal sinus rhythm (06/03 1900) Resp:  [14-19] 17 (06/04 0351) BP: (125-145)/(53-69) 125/59 (06/04 0351) SpO2:  [91 %-97 %] 91 % (06/04 0351)     Intake/Output from previous day: 06/03 0701 - 06/04 0700 In: 833 [P.O.:833] Out: 750 [Urine:750]   Physical Exam:  Cardiovascular: RRR Pulmonary: Clear to auscultation on left and slightly diminished right basilar breath sounds Abdomen: Soft, non tender, bowel sounds present. Extremities: No lower extremity edema. Wounds: Clean and dry. Scant drainage from chest tube wounds   Lab Results: CBC: Recent Labs    10/16/23 0244 10/17/23 0247  WBC 10.1 7.6  HGB 7.9* 7.9*  HCT 24.2* 23.7*  PLT 459* 437*   BMET:  Recent Labs    10/16/23 0244 10/17/23 0247  NA 136 136  K 3.7 3.4*  CL 97* 98  CO2 31 29  GLUCOSE 108* 108*  BUN 24* 13  CREATININE 0.95 0.85  CALCIUM 8.3* 7.9*    PT/INR:  No results for input(s): "LABPROT", "INR" in the last 72 hours.  ABG:  INR: Will add last result for INR, ABG once components are confirmed Will add last 4 CBG results once components are confirmed  Assessment/Plan:  1. CV - SR, SB at times. He has a history of a fib. Apixaban   On Amlodipine  5 mg daily.  2.  Pulmonary - On room air. Chest tubes removed yesterday. CXR this am appears stable (bibasilar atelectasis, small pleural effusions). Encourage incentive spirometer and flutter valve. 3. DM-CBGs 171/279//63. Has Insulin  pump and is using since yesterday. 4. Anemia-H and H this am decreased to 7.9 and 23.7 5. On Lovenox   for DVT prophylaxis 6. ID-WBC this am 7600. He was on 2 different antibiotics at home and per patient, finished both (for 5 days each). Previously treated for Enterobacter and Enterococcus (GB fossa abscess, s/p Xi cholecystectomy and drain placement by IR). Gram stain of right pleural fluid showed no organisms and culture shows no growth 3 days. Gram stain of right pleural peel showed rare gram positive cocci and culture showed no growth 2 days 7. Supplement potassium I reviewed surgery discharge instructions with the patient and have arranged for follow up in office.   Donielle M ZimmermanPA-C 10/17/2023,6:54 AM  Patient seen and examined, agree with findings and plan outlined above CXR looks good Ok for dc from our standpoint  [name], MD [facility] [phone]

## 2023-10-18 ENCOUNTER — Telehealth: Payer: Self-pay

## 2023-10-18 NOTE — Transitions of Care (Post Inpatient/ED Visit) (Signed)
 10/18/2023  Name: Joel Cato Topper Sr. MRN: [id number] DOB: 1945-07-11  Today's TOC FU Call Status: Today's TOC FU Call Status:: Successful TOC FU Call Completed TOC FU Call Complete Date: 10/18/23 Patient's Name and Date of Birth confirmed.  Transition Care Management Follow-up Telephone Call Date of Discharge: 10/17/23 Discharge Facility: [facility]) Type of Discharge: Inpatient Admission Primary Inpatient Discharge Diagnosis:: Recurrent pleural effusion on right How have you been since you were released from the hospital?: Better (reports breathing is improved.) Any questions or concerns?: No  Items Reviewed: Did you receive and understand the discharge instructions provided?: Yes Medications obtained,verified, and reconciled?: Yes (Medications Reviewed) Any new allergies since your discharge?: No Dietary orders reviewed?: Yes Type of Diet Ordered:: low sodium heart healthy diet.  with added supplements Do you have support at home?: Yes Name of Support/Comfort Primary Source: wife and children  Medications Reviewed Today: Medications Reviewed Today     Reviewed by Vanetta Generous, RN (Registered Nurse) on 10/18/23 at 1106  Med List Status: <None>   Medication Order Taking? Sig Documenting Provider Last Dose Status Informant  Abatacept  (ORENCIA  IV) [id number] No Inject 750 mg into the vein every 28 (twenty-eight) days.  Patient not taking: Reported on 10/08/2023   [provider] Not Taking Active Family Member, Pharmacy Records           Med Note Shann Darnel, [place] A   Mon Aug 06, 2023  1:52 PM) On hold for procedure   albuterol  (PROVENTIL ) (2.5 MG/3ML) 0.083% nebulizer solution [id number] No Take 3 mLs (2.5 mg total) by nebulization every 6 (six) hours as needed for wheezing or shortness of breath.  Patient not taking: Reported on 10/18/2023   [name], MD Not Taking Active Family Member, Pharmacy Records  albuterol  (VENTOLIN  [facility]) [phone] Base) MCG/ACT inhaler  [id number] No Inhale 2 puffs into the lungs every 6 (six) hours as needed for wheezing or shortness of breath.  Patient not taking: Reported on 10/08/2023   [name], MD Not Taking Active Family Member, Pharmacy Records  amLODipine  (NORVASC ) 5 MG tablet [id number] Yes Take 1 tablet (5 mg total) by mouth daily.  Patient taking differently: Take 5 mg by mouth in the morning.   [name], MD Taking Active Family Member, Pharmacy Records  apixaban  (ELIQUIS ) 5 MG TABS tablet [id number] Yes Take 1 tablet (5 mg total) by mouth 2 (two) times daily. Lenise Quince, MD Taking Active Family Member, Pharmacy Records  ascorbic acid  (VITAMIN C ) 500 MG tablet [id number] Yes Take 500 mg by mouth in the morning. [provider] Taking Active Family Member, Pharmacy Records  calcium carbonate (OSCAL) 1500 (600 Ca) MG TABS tablet [id number] Yes Take 600 mg of elemental calcium by mouth in the morning. [provider] Taking Active Family Member, Pharmacy Records  calcium carbonate (TUMS - DOSED IN MG ELEMENTAL CALCIUM) 500 MG chewable tablet [id number] Yes Chew 1 tablet by mouth as needed for indigestion or heartburn. [provider] Taking Active Family Member, Pharmacy Records  cholecalciferol (VITAMIN D3) 25 MCG (1000 UNIT) tablet [id number] Yes Take 1,000 Units by mouth in the morning. [provider] Taking Active Family Member, Pharmacy Records  feeding supplement (ENSURE ENLIVE / ENSURE PLUS) LIQD [id number] No Take 237 mLs by mouth 2 (two) times daily between meals.  Patient not taking: Reported on 10/18/2023   Rosena Conradi, MD Not Taking Active   Insulin  Aspart, w/Niacinamide, (FIASP ) 100 UNIT/ML SOLN [id number] Yes 77 Units  continuous. Insulin  pump [provider] Taking Active Family Member, Pharmacy Records           Med Note Rochelle Chu, [address] Pinon   Fri Bowsher 23, 2025  9:26 AM) Pumps in place   insulin  glargine (LANTUS ) 100 UNIT/ML injection [id number] No Inject 16  Units into the skin as needed (If pump goes out).  Patient not taking: Reported on 10/18/2023   [provider] Not Taking Active Family Member, Pharmacy Records           Med Note Linward Rick   Wed May 23, 2023 10:43 AM) Pump is in place   KRILL OIL PO [id number] Yes Take 400 mg by mouth in the morning. [provider] Taking Active Family Member, Pharmacy Records  loratadine  (CLARITIN ) 10 MG tablet [id number] Yes Take 10 mg by mouth in the morning. [provider] Taking Active Family Member, Pharmacy Records  Multiple Vitamin (MULTIVITAMIN WITH MINERALS) TABS [id number] Yes Take 1 tablet by mouth in the morning. Centrum [provider] Taking Active Family Member, Pharmacy Records  ondansetron  (ZOFRAN -ODT) 4 MG disintegrating tablet [id number] No Take 1 tablet (4 mg total) by mouth every 8 (eight) hours as needed for nausea or vomiting.  Patient not taking: Reported on 10/18/2023   Ninetta Basket, MD Not Taking Active Family Member, Pharmacy Records  ONE [facility] ULTRA TEST test strip [id number] Yes 1 each daily. Pt says 2-3 times daily [provider] Taking Active Family Member, Pharmacy Records  pantoprazole  (PROTONIX ) 40 MG tablet [id number] Yes TAKE 1 TABLET BY MOUTH DAILY  Patient taking differently: Take 40 mg by mouth in the morning.   Mealor, Augustus E, MD Taking Active Family Member, Pharmacy Records  Potassium 99 MG TABS [id number] Yes Take 99 mg by mouth in the morning. [provider] Taking Active Family Member, Pharmacy Records  pravastatin  (PRAVACHOL ) 40 MG tablet [id number] Yes Take 40 mg by mouth in the morning. [provider] Taking Active Family Member, Pharmacy Records           Med Note Micheline Ahr Ante 26, 2025  5:18 PM) On hold per heart doctor  pyridOXINE (VITAMIN B6) 50 MG tablet [id number] Yes Take 50 mg by mouth in the morning. [provider] Taking Active Family Member, Pharmacy Records   ramipril  (ALTACE ) 5 MG capsule [id number] Yes Take 1 capsule (5 mg total) by mouth daily.  Patient taking differently: Take 5 mg by mouth in the morning.   [name], MD Taking Active Family Member, Pharmacy Records  sodium chloride  flush 0.9 % SOLN injection [id number] Yes Please flush drain with 5 mL normal saline from pre-filled syringe once daily. Pommier, Wyatt H, PA-C Taking Active   traMADol  (ULTRAM ) 50 MG tablet [id number] Yes Take 1 tablet (50 mg total) by mouth every 6 (six) hours as needed for moderate pain (pain score 4-6). Pokhrel, Amador Bad, MD Taking Active   vitamin E  400 UNIT capsule [id number] Yes Take 400 Units by mouth in the morning. [provider] Taking Active Family Member, Pharmacy Records  zinc gluconate 50 MG tablet [id number] Yes Take 50 mg by mouth in the morning. [provider] Taking Active Family Member, Pharmacy Records            Home Care and Equipment/Supplies: Were Home Health Services Ordered?: No Any new equipment or medical supplies ordered?: No  Functional Questionnaire: Do you need assistance with bathing/showering or dressing?: No Do you need assistance with meal  preparation?: Yes (family) Do you need assistance with eating?: No Do you have difficulty maintaining continence: No Do you need assistance with getting out of bed/getting out of a chair/moving?: Yes (using a lift chair) Do you have difficulty managing or taking your medications?: No  Follow up appointments reviewed: PCP Follow-up appointment confirmed?: Yes MD Provider Line Number:[phone] Given: No Date of PCP follow-up appointment?: 10/25/23 Follow-up Provider: Appointment scheduled during [facility] call. Specialist Hospital Follow-up appointment confirmed?: Yes Date of Specialist follow-up appointment?: 10/30/23 Follow-Up Specialty Provider:: [facility] Do you need transportation to your follow-up appointment?: No Do you understand care options if your condition(s)  worsen?: Yes-patient verbalized understanding  SDOH Interventions Today    Flowsheet Row Most Recent Value  SDOH Interventions   Food Insecurity Interventions Intervention Not Indicated  Housing Interventions Intervention Not Indicated  Transportation Interventions Intervention Not Indicated  Utilities Interventions Intervention Not Indicated      Patient reports that he is breathing so much better.  States no current concerns. Reviewed all discharge instructions, medication and activity.  Patient reported that he could not get a hospital follow up until 1 month out.  Care guides assisted with securing timely follow up in 1 week.   Reviewed with patient the importance of good nutrition with a recent 45 pounds weight loss.  Reviewed other options as patient does not like ensure.  Reviewed Glucerna.  Reviewed with patient s/s of infection and when to call the MD.    Reviewed and offered 30 day TOC program and patient declines. States that his family is helping him manage well.  Provided my contact information and encouraged patient to call me if needed.  Joel Blade, RN, BSN, CEN [employer]- Transition of Care Team.  [facility] [phone]

## 2023-10-19 ENCOUNTER — Ambulatory Visit
Admission: RE | Admit: 2023-10-19 | Discharge: 2023-10-19 | Disposition: A | Discharge status: Home / Self Care | Source: Ambulatory Visit | Attending: General Surgery | Admitting: General Surgery

## 2023-10-19 ENCOUNTER — Ambulatory Visit
Admission: RE | Admit: 2023-10-19 | Discharge: 2023-10-19 | Disposition: A | Discharge status: Home / Self Care | Source: Ambulatory Visit | Attending: Radiology | Admitting: Radiology

## 2023-10-19 ENCOUNTER — Telehealth: Payer: Self-pay | Admitting: Gastroenterology

## 2023-10-19 DIAGNOSIS — R188 Other ascites: Secondary | ICD-10-CM

## 2023-10-19 HISTORY — PX: IR RADIOLOGIST EVAL & MGMT: IMG5224

## 2023-10-19 LAB — AEROBIC/ANAEROBIC CULTURE W GRAM STAIN (SURGICAL/DEEP WOUND)

## 2023-10-19 MED ORDER — IOPAMIDOL (ISOVUE-300) INJECTION 61%
100.0000 mL | Freq: Once | INTRAVENOUS | Status: AC | PRN
  Administered 2023-10-19: 100 mL via INTRAVENOUS

## 2023-10-19 NOTE — Telephone Encounter (Signed)
 Good afternoon, Dr. Gupta    DOC of the Day in the PM    I received a call from this patient daughter requesting to transfer her father's care over to us . Patient daughter stated that he was seen by [facility] GI and the referred her father to get her gallbladder removed and since then her father has had a lot of complication. Patient daughter is not happy with the care her father has received with [facility]. Patient also has a referral for the following symptoms Shortness of breath,laparoscopic cholecystectomy and Intra-abdominal fluid collection. Would you please advise on scheduling this patient.     Thank you.

## 2023-10-19 NOTE — Progress Notes (Signed)
 Referring Physician(s): Marlin Simmonds PA / Dr. [name]  Chief Complaint: The patient is seen in follow up today s/p Gallbladder fossa drain placed on 5.10.25. Exchanged and repositioned on 5.29.25  History of present illness:   78 y.o. male outpatient. History of a fib, (on eliquis ) , HLD, HTN, DM, Lap cholecystectomy on 4.23.25. Presented to the ED at [facility] on 5.9.25 with abdominal pain, nausea and vomiting. Found to have an intra abdominal abscesses in the gallbladder fossa. On 5.10.25 IR placed a 10 Fr intra abdominal abscess drain. Drain injection performed on 5.23.25 showed a communication with right sided pleural effusion. On 5.29.25 the gallbladder fossa drain was found to be retracted and IR exchanged and repositioned the intra abdominal abscess drain for a 8 Fr. Mr Mansfield underwent a VATS on 6.1.25. patient was discharged on 6.4.25. Family called reporting that saline is coming out around the exit site when flushing. Patient states that prior to today he has minimal output since his discharge. Patient presents for CT and drain evaluation.   Currently without any significant complaints. Patient alert and laying in bed,calm. Denies any fevers, headache, chest pain, SOB, cough, abdominal pain, nausea, vomiting or bleeding.He states that his breathing has greatly improved since the VATS  Positive RUQ drain to gravity bag. Site is unremarkable with no erythema, edema, tenderness, bleeding or drainage noted at exit site. Suture and stat lock in place. Dressing is clean dry and intact. Pale colored fluid noted in line of the gravity bag. Drain is able to be flushed easily  Past Medical History:  Diagnosis Date   A-fib (HCC)    AKI (acute kidney injury) (HCC) 05/18/2021   Anemia    Arthritis    "all over"   Bradycardia    Chronic bronchitis (HCC)    "get it just about q yr" (03/17/2014)   Complication of anesthesia    difficulty waking up   Daily headache    "here lately" (03/17/2014);  relates to sinuses   GERD (gastroesophageal reflux disease)    Hard of hearing    hearing aides bilat   Hiatal hernia    History of blood transfusion 2008   "related to OR"   History of kidney stones    Hyperlipidemia    Hypertension    Pneumonia 1972 X 1   Rheumatoid arthritis (HCC)    Type II diabetes mellitus (HCC)    Wears glasses     Past Surgical History:  Procedure Laterality Date   ATRIAL FIBRILLATION ABLATION N/A 05/23/2023   Procedure: ATRIAL FIBRILLATION ABLATION;  Surgeon: [name], MD;  Location: [facility] INVASIVE CV LAB;  Service: Cardiovascular;  Laterality: N/A;   broken finger      surgical repaired left hand 2nd finger    CARDIOVERSION N/A 11/08/2022   Procedure: CARDIOVERSION;  Surgeon: [name], MD;  Location: [facility] INV[facility] CV [facility];  Service: Cardiovascular;  Laterality: N/A;   cyst removed      per left knee/posteriorly   CYSTECTOMY Left 2009   "behind knee"   INGUINAL HERNIA REPAIR Right ?2010   IR CV LINE INJECTION  10/02/2023   IR CV LINE INJECTION  10/10/2023   IR GUIDED DRAIN W CATHETER PLACEMENT  10/11/2023   IR THORACENTESIS ASP PLEURAL SPACE W/IMG GUIDE  10/03/2023   JOINT REPLACEMENT     KNEE ARTHROSCOPY Bilateral 1980's   LIVER BIOPSY N/A 09/05/2023   Procedure: BIOPSY, LIVER;  Surgeon: [name], MD;  Location: [facility] ORS;  Service:  General;  Laterality: N/A;   PARTIAL KNEE ARTHROPLASTY Right 08/27/2015   Procedure: RIGHT UNICOMPARTMENTAL KNEE ARTHROPLASTY;  Surgeon: [name], MD;  Location: [facility] OR[facility];  Service: Orthopedics;  Laterality: Right;   REVISION TOTAL KNEE ARTHROPLASTY Left 2008   SHOULDER SURGERY Left 2019   TOTAL KNEE ARTHROPLASTY Left 2003   VIDEO ASSISTED THORACOSCOPY (VATS)/DECORTICATION Right 10/14/2023   Procedure: VIDEO ASSISTED THORACOSCOPY (VATS)/DECORTICATION;  Surgeon: Zelphia Higashi, MD;  Location: [facility] OR;  Service: Thoracic;  Laterality: Right;  DRAINAGE OF PLEURAL EFFUSION    Allergies: Codeine,  Lipitor [atorvastatin], Invokana [canagliflozin], Losartan, Roxicodone  [oxycodone ], and Vicodin [hydrocodone -acetaminophen ]  Medications: Prior to Admission medications   Medication Sig Start Date End Date Taking? Authorizing Provider  Abatacept  (ORENCIA  IV) Inject 750 mg into the vein every 28 (twenty-eight) days. Patient not taking: Reported on 10/08/2023    [provider]  albuterol  (PROVENTIL ) (2.5 MG/3ML) 0.083% nebulizer solution Take 3 mLs (2.5 mg total) by nebulization every 6 (six) hours as needed for wheezing or shortness of breath. Patient not taking: Reported on 10/18/2023 10/02/23   [name], MD  albuterol  (VENTOLIN  HFA) [phone] Base) MCG/ACT inhaler Inhale 2 puffs into the lungs every 6 (six) hours as needed for wheezing or shortness of breath. Patient not taking: Reported on 10/08/2023 10/01/23   [name], MD  amLODipine  (NORVASC ) 5 MG tablet Take 1 tablet (5 mg total) by mouth daily. Patient taking differently: Take 5 mg by mouth in the morning. 09/04/23   [name], MD  apixaban  (ELIQUIS ) 5 MG TABS tablet Take 1 tablet (5 mg total) by mouth 2 (two) times daily. 07/16/23   Lenise Quince, MD  ascorbic acid  (VITAMIN C ) 500 MG tablet Take 500 mg by mouth in the morning.    [provider]  calcium carbonate (OSCAL) 1500 (600 Ca) MG TABS tablet Take 600 mg of elemental calcium by mouth in the morning.    [provider]  calcium carbonate (TUMS - DOSED IN MG ELEMENTAL CALCIUM) 500 MG chewable tablet Chew 1 tablet by mouth as needed for indigestion or heartburn.    [provider]  cholecalciferol (VITAMIN D3) 25 MCG (1000 UNIT) tablet Take 1,000 Units by mouth in the morning.    [provider]  feeding supplement (ENSURE ENLIVE / ENSURE PLUS) LIQD Take 237 mLs by mouth 2 (two) times daily between meals. Patient not taking: Reported on 10/18/2023 10/17/23   Pokhrel, Laxman, MD  Insulin  Aspart, w/Niacinamide, (FIASP ) 100 UNIT/ML  SOLN 77 Units continuous. Insulin  pump 09/27/22   [provider]  insulin  glargine (LANTUS ) 100 UNIT/ML injection Inject 16 Units into the skin as needed (If pump goes out). Patient not taking: Reported on 10/18/2023    [provider]  KRILL OIL PO Take 400 mg by mouth in the morning.    [provider]  loratadine  (CLARITIN ) 10 MG tablet Take 10 mg by mouth in the morning.    [provider]  Multiple Vitamin (MULTIVITAMIN WITH MINERALS) TABS Take 1 tablet by mouth in the morning. Centrum    [provider]  ondansetron  (ZOFRAN -ODT) 4 MG disintegrating tablet Take 1 tablet (4 mg total) by mouth every 8 (eight) hours as needed for nausea or vomiting. Patient not taking: Reported on 10/18/2023 09/09/23   Ninetta Basket, MD  ONE TOUCH ULTRA TEST test strip 1 each daily. Pt says 2-3 times daily 05/26/16   [provider]  pantoprazole  (PROTONIX ) 40 MG  tablet TAKE 1 TABLET BY MOUTH DAILY Patient taking differently: Take 40 mg by mouth in the morning. 06/27/23   Mealor, Augustus E, MD  Potassium 99 MG TABS Take 99 mg by mouth in the morning.    [provider]  pravastatin  (PRAVACHOL ) 40 MG tablet Take 40 mg by mouth in the morning.    [provider]  pyridOXINE (VITAMIN B6) 50 MG tablet Take 50 mg by mouth in the morning.    [provider]  ramipril  (ALTACE ) 5 MG capsule Take 1 capsule (5 mg total) by mouth daily. Patient taking differently: Take 5 mg by mouth in the morning. 09/04/23   [name], MD  sodium chloride  flush 0.9 % SOLN injection Please flush drain with 5 mL normal saline from pre-filled syringe once daily. 10/12/23   Pommier, Wyatt H, PA-C  traMADol  (ULTRAM ) 50 MG tablet Take 1 tablet (50 mg total) by mouth every 6 (six) hours as needed for moderate pain (pain score 4-6). 10/17/23   Pokhrel, Amador Bad, MD  vitamin E  400 UNIT capsule Take 400 Units by mouth in the morning.    [provider]  zinc  gluconate 50 MG tablet Take 50 mg by mouth in the morning.    [provider]     Family History  Problem Relation Age of Onset   Cancer Father        prostate    Social History   Socioeconomic History   Marital status: Married    Spouse name: Nadean August   Number of children: 3   Years of education: College   Highest education level: Not on file  Occupational History    Employer: UNEMPLOYED  Tobacco Use   Smoking status: Former    Current packs/day: 0.00    Average packs/day: 1 pack/day for 5.0 years (5.0 ttl pk-yrs)    Types: Cigarettes, Cigars    Start date: 05/16/1963    Quit date: 05/15/1968    Years since quitting: 55.4    Passive exposure: Never   Smokeless tobacco: Never   Tobacco comments:    Former smoker 06/20/23  Vaping Use   Vaping status: Never Used  Substance and Sexual Activity   Alcohol use: No    Alcohol/week: 0.0 standard drinks of alcohol   Drug use: Never   Sexual activity: Yes  Other Topics Concern   Not on file  Social History Narrative   Patient lives at home with family.   Caffeine Use: occasionally   Social Drivers of Corporate investment banker Strain: Not on file  Food Insecurity: No Food Insecurity (10/18/2023)   Hunger Vital Sign    Worried About Running Out of Food in the Last Year: Never true    Ran Out of Food in the Last Year: Never true  Transportation Needs: No Transportation Needs (10/18/2023)   PRAPARE - Administrator, Civil Service (Medical): No    Lack of Transportation (Non-Medical): No  Physical Activity: Not on file  Stress: Not on file  Social Connections: Moderately Isolated (10/08/2023)   Social Connection and Isolation Panel [NHANES]    Frequency of Communication with Friends and Family: More than three times a week    Frequency of Social Gatherings with Friends and Family: Once a week    Attends Religious Services: Never    Database administrator or Organizations: No    Attends Banker  Meetings: Never    Marital Status: Married  Vital Signs: There were no vitals taken for this visit.  Physical Exam Vitals and nursing note reviewed.  Constitutional:      Appearance: He is well-developed.  HENT:     Head: Normocephalic.  Pulmonary:     Effort: Pulmonary effort is normal.  Abdominal:     Comments: Positive RUQ drain to gravity bag. Site is unremarkable with no erythema, edema, tenderness, bleeding or drainage noted at exit site. Suture and stat lock in place. Dressing is clean dry and intact. Pale colored fluid noted in line of the gravity bag. Drain is able to be flushed easily.   Musculoskeletal:        General: Normal range of motion.     Cervical back: Normal range of motion.  Skin:    General: Skin is warm and dry.  Neurological:     General: No focal deficit present.     Mental Status: He is alert and oriented to person, place, and time. Mental status is at baseline.  Psychiatric:        Mood and Affect: Mood normal.        Behavior: Behavior normal.        Thought Content: Thought content normal.        Judgment: Judgment normal.     Imaging: DG Chest 2 View Result Date: 10/17/2023 CLINICAL DATA:  142230 Pleural effusion 142230 EXAM: CHEST - 2 VIEW COMPARISON:  10/16/2023 FINDINGS: Stable right pleural effusion. Stable patchy atelectasis at the right lung base. Left lung clear. Heart size and mediastinal contours are within normal limits. Aortic Atherosclerosis (ICD10-170.0). Visualized bones unremarkable. IMPRESSION: Stable right pleural effusion and right basilar atelectasis. Electronically Signed   By: Nicoletta Barrier M.D.   On: 10/17/2023 08:09   DG Chest 1V REPEAT Same Day Result Date: 10/16/2023 CLINICAL DATA:  Chest tube removal. EXAM: CHEST - 1 VIEW SAME DAY COMPARISON:  Same day. FINDINGS: Right-sided chest tube has been removed. No definite pneumothorax is noted. IMPRESSION: No definite pneumothorax status post right-sided chest tube removal.  Electronically Signed   By: Rosalene Colon M.D.   On: 10/16/2023 15:00   DG CHEST PORT 1 VIEW Result Date: 10/16/2023 CLINICAL DATA:  Pneumothorax follow-up EXAM: PORTABLE CHEST 1 VIEW COMPARISON:  October 15, 2023 FINDINGS: No change in the right chest tubes position. No change in minute right lateral apical pneumothorax. Persistent right pleural reaction with right lower lobe hypoventilatory atelectasis. No consolidations or significant congestive changes IMPRESSION: No change in the right chest tubes position. No change in minute right lateral apical pneumothorax. Persistent right pleural reaction with right lower lobe hypoventilatory atelectasis. Electronically Signed   By: Fredrich Jefferson M.D.   On: 10/16/2023 08:30    Labs:  CBC: Recent Labs    10/14/23 0438 10/15/23 0241 10/16/23 0244 10/17/23 0247  WBC 7.2 9.7 10.1 7.6  HGB 10.3* 8.7* 7.9* 7.9*  HCT 30.5* 26.4* 24.2* 23.7*  PLT 490* 475* 459* 437*    COAGS: Recent Labs    05/28/23 1737 06/01/23 1343 08/17/23 1413 09/22/23 0359 10/05/23 0905 10/13/23 1142  INR 2.8*   < > 1.1 1.2 1.2 1.1  APTT 50*  --   --   --   --   --    < > = values in this interval not displayed.    BMP: Recent Labs    10/15/23 0241 10/15/23 0845 10/16/23 0244 10/17/23 0247  NA 129* 131* 136 136  K 4.1 3.8 3.7 3.4*  CL  93* 91* 97* 98  CO2 25 28 31 29   GLUCOSE 339* 283* 108* 108*  BUN 23 24* 24* 13  CALCIUM 8.4* 8.6* 8.3* 7.9*  CREATININE 1.35* 1.40* 0.95 0.85  GFRNONAA 54* 52* >60 >60    LIVER FUNCTION TESTS: Recent Labs    10/05/23 1933 10/09/23 0422 10/10/23 0602 10/16/23 0244  BILITOT 0.8 0.5 0.6 0.4  AST 44* 22 18 23   ALT 32 19 17 16   ALKPHOS 94 93 79 199*  PROT 6.2* 5.5* 5.2* 4.9*  ALBUMIN 2.5* 1.9* 1.8* 1.9*    Assessment:  [name] presents today with his family for a drain follow up. Output has been minimal, averaging He has been  flushing  daily since his discharge. He  currently denies fever, headache, chest pain,  dyspnea, cough, abdominal pain, nausea, vomiting or bleeding.  The RUQ drain has  minimal yellow clear output noted in the line of the gravity bag.  Suture and stat locks are in place. No, edema, tenderness, bleeding or drainage noted at exit site. Dressing is clean dry and intact. Follow-up CT of abdomen pelvis done today reveals resolved gallbladder fossa fluid collection with stable drain position. Case was reviewed by Dr. Kristopher Pheasant and decision made to remove abdominal drain today.  The abdominal drain was removed in its entirety without immediate complications.  Gauze dressing applied to site.  PLAN: IR RUQ Abscess Drain Removal  Post-removal instructions: - Okay  to shower/sponge bath 24 hours post-removal. - No submerging (swimming, bathing) for 7 days post-removal. - Keep the dressing/bandage on to take shower, take dressing/bandage off and pat dry  the area completely before placing a new dressing/bandage  - Look for signs and symptoms of infection such as reddening of skin, pus like drainage,     fever and/or chills - Change dressing PRN until site fully healed.   Patient  and family verbalized understanding.   Signed: Marceil Sensor, NP 10/19/2023, 11:31 AM   Please refer to Dr. Fernando Hoyer attestation of this note for management and plan.

## 2023-10-22 NOTE — Telephone Encounter (Signed)
 Looks like more surgical problems She should follow-up with surgery She already is appointment with GI @ [facility].  Please keep that for now

## 2023-10-23 LAB — TOTAL BILIRUBIN, BODY FLUID: Total bilirubin, fluid: 0.8 mg/dL

## 2023-10-23 NOTE — Telephone Encounter (Signed)
 Unfortunately, not sure how we can help from GI standpoint She would make appointment with GI at [facility] for another opinion or follow-up with Dr. Greig Leather

## 2023-10-24 ENCOUNTER — Inpatient Hospital Stay: Admitting: Family Medicine

## 2023-10-24 ENCOUNTER — Other Ambulatory Visit (HOSPITAL_COMMUNITY): Payer: Self-pay

## 2023-10-25 ENCOUNTER — Ambulatory Visit: Admitting: Internal Medicine

## 2023-10-25 ENCOUNTER — Encounter: Payer: Self-pay | Admitting: Internal Medicine

## 2023-10-25 VITALS — BP 112/66 | HR 78 | Ht 66.0 in | Wt 182.2 lb

## 2023-10-25 DIAGNOSIS — D62 Acute posthemorrhagic anemia: Secondary | ICD-10-CM

## 2023-10-25 DIAGNOSIS — Z9889 Other specified postprocedural states: Secondary | ICD-10-CM | POA: Diagnosis not present

## 2023-10-25 DIAGNOSIS — R188 Other ascites: Secondary | ICD-10-CM

## 2023-10-25 DIAGNOSIS — R5381 Other malaise: Secondary | ICD-10-CM

## 2023-10-25 DIAGNOSIS — I10 Essential (primary) hypertension: Secondary | ICD-10-CM

## 2023-10-25 DIAGNOSIS — J90 Pleural effusion, not elsewhere classified: Secondary | ICD-10-CM | POA: Diagnosis not present

## 2023-10-25 NOTE — Assessment & Plan Note (Signed)
 Status post robotic laparoscopic cholecystectomy CT abdomen/pelvis reviewed S/p drain placement, now removed  Fluid culture showed E. Cloacae and E. Faecium, was given Zosyn  during hospitalization, complete course of amoxicillin  and levofloxacin  Currently afebrile Follow-up with general surgeon in outpatient setting

## 2023-10-25 NOTE — Assessment & Plan Note (Addendum)
 Had IR thoracentesis initially S/p VATS procedure Needs repeat CXR, planned to see CT surgery and will get it on that day at their office according his daughter-in-law Dyspnea improved now

## 2023-10-25 NOTE — Assessment & Plan Note (Signed)
 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

## 2023-10-25 NOTE — Assessment & Plan Note (Signed)
 BP Readings from Last 1 Encounters:  10/25/23 112/66   Usually well-controlled with amlodipine  and Ramipril  Reports BP being around 120s/70s at home, advised to hold ramipril  if BP less than 110/70 as he has mild dizziness and flushing upon standing up Counseled for compliance with the medications Advised DASH diet and moderate exercise/walking as tolerated

## 2023-10-25 NOTE — Assessment & Plan Note (Signed)
 Likely due to recent procedures, including laparoscopic cholecystectomy, followed by abdominal drain, thoracentesis and later VATS Hb: 7.9, but denies any active bleeding Of note, on Eliquis  due to history of A-fib Recheck CBC after 2 weeks Advised to get immediate medical attention if any signs of bleeding, such as melena, hematochezia, hematuria or hematemesis or signs of worsening dyspnea or fatigue

## 2023-10-25 NOTE — Assessment & Plan Note (Addendum)
 Fatigue and weakness of LE, limiting physical activity, likely related to recent hospitalizations Will benefit from home PT, referred to home health Advised to continue using incentive spirometry

## 2023-10-25 NOTE — Patient Instructions (Signed)
 Please continue to take medications as prescribed.  Please continue to follow low salt diet and ambulate as tolerated.  Please get blood tests done after 2 weeks.

## 2023-10-25 NOTE — Progress Notes (Signed)
 Established Patient Office Visit  Subjective:  Patient ID: Joel Wyndham Boxwell Sr., male    DOB: [birth year]  Age: 78 y.o. MRN: [id number]  CC:  Chief Complaint  Patient presents with   Follow-up    Hospital f/u d/c on 06/04, pt reports sx of feeling sore and some pain on his right side.     HPI Joel Monie Malinowski Sr. is a 78 y.o. male with past medical history of HTN, type II DM with HLD, RA, OA and obesity who presents for follow-up after recent hospitalization from 10/08/23-10/17/23.  He was admitted at [facility] for recurrent right pleural effusion.  He had a complicated postop course after laparoscopic cholecystectomy (08/29/23), had intra-abdominal fluid collection and gastric outlet obstruction with concern for abscess.  He had IR drain placed, which showed Enterococcus and Enterobacter species on culture, had IV Zosyn , followed by oral amoxicillin  and levofloxacin . 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 Drain was also reportedly repositioned on 5/23. Patient had 2 thoracocentesis for this symptoms. On 10/08/23, patient presented with shortness of breath worse on exertion with right-sided chest pain. CT scan of the chest abdomen pelvis done on 10/09/2023 showed interval increase in size of loculated at least small volume right pleural effusion with hilar and mediastinal lymphadenopathy. Stable to slightly increased size of gallbladder fossa collection of free fluid and gas. Pulmonary was consulted due to loculated effusion and subsequently CT surgery evaluation was recommended.  Patient then underwent went VATS procedure and chest tube placement which has been removed since 10/16/2023.  Today, he reports improvement in dyspnea and abdominal pain.  He has soreness over the incision site on right mid back area.  Denies any bleeding or pus discharge from the incision site.  He has follow-up with CT  surgery in the next week, and reports he is going to get chest x-ray there.  He also reports feeling fatigued due to prolonged hospitalizations.      Past Medical History:  Diagnosis Date   A-fib (HCC)    AKI (acute kidney injury) (HCC) 05/18/2021   Anemia    Arthritis    all over   Bradycardia    Chronic bronchitis (HCC)    get it just about q yr (03/17/2014)   Complication of anesthesia    difficulty waking up   Daily headache    here lately (03/17/2014); relates to sinuses   GERD (gastroesophageal reflux disease)    Hard of hearing    hearing aides bilat   Hiatal hernia    History of blood transfusion 2008   related to OR   History of kidney stones    Hyperlipidemia    Hypertension    Pneumonia 1972 X 1   Rheumatoid arthritis (HCC)    Type II diabetes mellitus (HCC)    Wears glasses     Past Surgical History:  Procedure Laterality Date   ATRIAL FIBRILLATION ABLATION N/A 05/23/2023   Procedure: ATRIAL FIBRILLATION ABLATION;  Surgeon: [name], MD;  Location: [facility];  Service: Cardiovascular;  Laterality: N/A;   broken finger      surgical repaired left hand 2nd finger    CARDIOVERSION N/A 11/08/2022   Procedure: CARDIOVERSION;  Surgeon: [name], MD;  Location: [facility];  Service: Cardiovascular;  Laterality: N/A;   cyst removed      per left knee/posteriorly   CYSTECTOMY Left 2009  behind knee   INGUINAL HERNIA REPAIR Right ?2010   IR CV LINE INJECTION  10/02/2023   IR CV LINE INJECTION  10/10/2023   IR GUIDED DRAIN W CATHETER PLACEMENT  10/11/2023   IR RADIOLOGIST EVAL & MGMT  10/19/2023   IR THORACENTESIS ASP PLEURAL SPACE W/IMG GUIDE  10/03/2023   JOINT REPLACEMENT     KNEE ARTHROSCOPY Bilateral 1980's   LIVER BIOPSY N/A 09/05/2023   Procedure: BIOPSY, LIVER;  Surgeon: [name], MD;  Location: [facility];  Service: General;  Laterality: N/A;   PARTIAL KNEE ARTHROPLASTY Right 08/27/2015   Procedure: RIGHT  UNICOMPARTMENTAL KNEE ARTHROPLASTY;  Surgeon: [name], MD;  Location: [facility];  Service: Orthopedics;  Laterality: Right;   REVISION TOTAL KNEE ARTHROPLASTY Left 2008   SHOULDER SURGERY Left 2019   TOTAL KNEE ARTHROPLASTY Left 2003   VIDEO ASSISTED THORACOSCOPY (VATS)/DECORTICATION Right 10/14/2023   Procedure: VIDEO ASSISTED THORACOSCOPY (VATS)/DECORTICATION;  Surgeon: Zelphia Higashi, MD;  Location: [facility] OR;  Service: Thoracic;  Laterality: Right;  DRAINAGE OF PLEURAL EFFUSION    Family History  Problem Relation Age of Onset   Cancer Father        prostate    Social History   Socioeconomic History   Marital status: Married    Spouse name: Nadean August   Number of children: 3   Years of education: College   Highest education level: Not on file  Occupational History    Employer: UNEMPLOYED  Tobacco Use   Smoking status: Former    Current packs/day: 0.00    Average packs/day: 1 pack/day for 5.0 years (5.0 ttl pk-yrs)    Types: Cigarettes, Cigars    Start date: 05/16/1963    Quit date: 05/15/1968    Years since quitting: 55.4    Passive exposure: Never   Smokeless tobacco: Never   Tobacco comments:    Former smoker 06/20/23  Vaping Use   Vaping status: Never Used  Substance and Sexual Activity   Alcohol use: No    Alcohol/week: 0.0 standard drinks of alcohol   Drug use: Never   Sexual activity: Yes  Other Topics Concern   Not on file  Social History Narrative   Patient lives at home with family.   Caffeine Use: occasionally   Social Drivers of Corporate investment banker Strain: Not on file  Food Insecurity: No Food Insecurity (10/18/2023)   Hunger Vital Sign    Worried About Running Out of Food in the Last Year: Never true    Ran Out of Food in the Last Year: Never true  Transportation Needs: No Transportation Needs (10/18/2023)   PRAPARE - Administrator, Civil Service (Medical): No    Lack of Transportation (Non-Medical): No  Physical Activity:  Not on file  Stress: Not on file  Social Connections: Moderately Isolated (10/08/2023)   Social Connection and Isolation Panel    Frequency of Communication with Friends and Family: More than three times a week    Frequency of Social Gatherings with Friends and Family: Once a week    Attends Religious Services: Never    Database administrator or Organizations: No    Attends Banker Meetings: Never    Marital Status: Married  Catering manager Violence: Not At Risk (10/18/2023)   Humiliation, Afraid, Rape, and Kick questionnaire    Fear of Current or Ex-Partner: No    Emotionally Abused: No    Physically Abused: No    Sexually Abused:  No    Outpatient Medications Prior to Visit  Medication Sig Dispense Refill   Abatacept  (ORENCIA  IV) Inject 750 mg into the vein every 28 (twenty-eight) days.     amLODipine  (NORVASC ) 5 MG tablet Take 1 tablet (5 mg total) by mouth daily. (Patient taking differently: Take 5 mg by mouth in the morning.) 90 tablet 3   apixaban  (ELIQUIS ) 5 MG TABS tablet Take 1 tablet (5 mg total) by mouth 2 (two) times daily. 60 tablet 6   ascorbic acid  (VITAMIN C ) 500 MG tablet Take 500 mg by mouth in the morning.     calcium carbonate (OSCAL) 1500 (600 Ca) MG TABS tablet Take 600 mg of elemental calcium by mouth in the morning.     calcium carbonate (TUMS - DOSED IN MG ELEMENTAL CALCIUM) 500 MG chewable tablet Chew 1 tablet by mouth as needed for indigestion or heartburn.     cholecalciferol (VITAMIN D3) 25 MCG (1000 UNIT) tablet Take 1,000 Units by mouth in the morning.     Insulin  Aspart, w/Niacinamide, (FIASP ) 100 UNIT/ML SOLN 77 Units continuous. Insulin  pump     insulin  glargine (LANTUS ) 100 UNIT/ML injection Inject 16 Units into the skin as needed (If pump goes out).     KRILL OIL PO Take 400 mg by mouth in the morning.     loratadine  (CLARITIN ) 10 MG tablet Take 10 mg by mouth in the morning.     Multiple Vitamin (MULTIVITAMIN WITH MINERALS) TABS Take 1  tablet by mouth in the morning. Centrum     ONE TOUCH ULTRA TEST test strip 1 each daily. Pt says 2-3 times daily  3   pantoprazole  (PROTONIX ) 40 MG tablet TAKE 1 TABLET BY MOUTH DAILY (Patient taking differently: Take 40 mg by mouth in the morning.) 90 tablet 3   Potassium 99 MG TABS Take 99 mg by mouth in the morning.     pravastatin  (PRAVACHOL ) 40 MG tablet Take 40 mg by mouth in the morning.     pyridOXINE (VITAMIN B6) 50 MG tablet Take 50 mg by mouth in the morning.     ramipril  (ALTACE ) 5 MG capsule Take 1 capsule (5 mg total) by mouth daily. (Patient taking differently: Take 5 mg by mouth in the morning.) 90 capsule 3   traMADol  (ULTRAM ) 50 MG tablet Take 1 tablet (50 mg total) by mouth every 6 (six) hours as needed for moderate pain (pain score 4-6). 30 tablet 0   vitamin E  400 UNIT capsule Take 400 Units by mouth in the morning.     zinc gluconate 50 MG tablet Take 50 mg by mouth in the morning.     albuterol  (PROVENTIL ) (2.5 MG/3ML) 0.083% nebulizer solution Take 3 mLs (2.5 mg total) by nebulization every 6 (six) hours as needed for wheezing or shortness of breath. (Patient not taking: Reported on 10/25/2023) 360 mL 1   albuterol  (VENTOLIN  HFA) 108 (90 Base) MCG/ACT inhaler Inhale 2 puffs into the lungs every 6 (six) hours as needed for wheezing or shortness of breath. (Patient not taking: Reported on 10/25/2023) 18 g 1   ondansetron  (ZOFRAN -ODT) 4 MG disintegrating tablet Take 1 tablet (4 mg total) by mouth every 8 (eight) hours as needed for nausea or vomiting. (Patient not taking: Reported on 10/18/2023) 12 tablet 0   feeding supplement (ENSURE ENLIVE / ENSURE PLUS) LIQD Take 237 mLs by mouth 2 (two) times daily between meals. (Patient not taking: Reported on 10/18/2023)     sodium chloride  flush 0.9 %  SOLN injection Please flush drain with 5 mL normal saline from pre-filled syringe once daily. 300 mL 1   No facility-administered medications prior to visit.    Allergies  Allergen Reactions    Codeine Swelling   Lipitor [Atorvastatin] Other (See Comments)    Muscle aches    Invokana [Canagliflozin] Other (See Comments)    Stomach upset    Losartan Nausea And Vomiting   Roxicodone  [Oxycodone ] Itching   Vicodin [Hydrocodone -Acetaminophen ] Itching    ROS Review of Systems  Constitutional:  Negative for chills and fever.  HENT:  Negative for congestion and sore throat.   Eyes:  Negative for pain and discharge.  Respiratory:  Positive for cough. Negative for shortness of breath.   Cardiovascular:  Negative for chest pain and palpitations.  Gastrointestinal:  Negative for diarrhea, nausea and vomiting.  Endocrine: Negative for polydipsia and polyuria.  Genitourinary:  Negative for dysuria and hematuria.  Musculoskeletal:  Positive for arthralgias (R knee) and back pain. Negative for neck pain and neck stiffness.  Skin:  Negative for rash.  Neurological:  Positive for weakness. Negative for dizziness.  Psychiatric/Behavioral:  Positive for sleep disturbance. Negative for agitation and behavioral problems.       Objective:    Physical Exam Vitals reviewed.  Constitutional:      General: He is not in acute distress.    Appearance: He is obese. He is not diaphoretic.     Comments: Has a cane  HENT:     Head: Normocephalic and atraumatic.     Nose: No congestion.     Mouth/Throat:     Mouth: Mucous membranes are moist.   Eyes:     General: No scleral icterus.    Extraocular Movements: Extraocular movements intact.    Cardiovascular:     Rate and Rhythm: Normal rate and regular rhythm.     Heart sounds: Normal heart sounds. No murmur heard. Pulmonary:     Breath sounds: Normal breath sounds. No wheezing or rales.   Musculoskeletal:     Cervical back: Neck supple. No tenderness.     Right knee: Swelling present. Decreased range of motion. Tenderness present.     Right lower leg: No edema.     Left lower leg: No edema.   Skin:    Comments: Cyst over upper  back Incision site over right sided mid back area - C/D/I   Neurological:     General: No focal deficit present.     Mental Status: He is alert and oriented to person, place, and time.     Motor: Weakness (B/l LE - 3/5) present.   Psychiatric:        Mood and Affect: Mood normal.        Behavior: Behavior normal.     BP 112/66   Pulse 78   Ht 5' 6 (1.676 m)   Wt 182 lb 3.2 oz (82.6 kg)   SpO2 97%   BMI 29.41 kg/m  Wt Readings from Last 3 Encounters:  10/25/23 182 lb 3.2 oz (82.6 kg)  10/17/23 180 lb 1.9 oz (81.7 kg)  10/05/23 180 lb 8 oz (81.9 kg)    Lab Results  Component Value Date   TSH 3.03 05/24/2016   Lab Results  Component Value Date   WBC 7.6 10/17/2023   HGB 7.9 (L) 10/17/2023   HCT 23.7 (L) 10/17/2023   MCV 87.5 10/17/2023   PLT 437 (H) 10/17/2023   Lab Results  Component Value Date  NA 136 10/17/2023   K 3.4 (L) 10/17/2023   CO2 29 10/17/2023   GLUCOSE 108 (H) 10/17/2023   BUN 13 10/17/2023   CREATININE 0.85 10/17/2023   BILITOT 0.4 10/16/2023   ALKPHOS 199 (H) 10/16/2023   AST 23 10/16/2023   ALT 16 10/16/2023   PROT 4.9 (L) 10/16/2023   ALBUMIN 1.9 (L) 10/16/2023   CALCIUM 7.9 (L) 10/17/2023   ANIONGAP 9 10/17/2023   EGFR 74 08/17/2023   Lab Results  Component Value Date   CHOL 120 07/03/2023   Lab Results  Component Value Date   HDL 34 (L) 07/03/2023   Lab Results  Component Value Date   LDLCALC 65 07/03/2023   Lab Results  Component Value Date   TRIG 103 07/03/2023   Lab Results  Component Value Date   CHOLHDL 3.5 07/03/2023   Lab Results  Component Value Date   HGBA1C 8.3 05/07/2023      Assessment & Plan:   Problem List Items Addressed This Visit       Cardiovascular and Mediastinum   Essential hypertension - Primary   BP Readings from Last 1 Encounters:  10/25/23 112/66   Usually well-controlled with amlodipine  and Ramipril  Reports BP being around 120s/70s at home, advised to hold ramipril  if BP less  than 110/70 as he has mild dizziness and flushing upon standing up Counseled for compliance with the medications Advised DASH diet and moderate exercise/walking as tolerated      Relevant Orders   CBC   CMP14+EGFR     Respiratory   Recurrent pleural effusion on right   Had IR thoracentesis initially S/p VATS procedure Needs repeat CXR, planned to see CT surgery and will get it on that day at their office according his daughter-in-law Dyspnea improved now      Relevant Orders   Ambulatory referral to Home Health     Other   Intra-abdominal fluid collection   Status post robotic laparoscopic cholecystectomy CT abdomen/pelvis reviewed S/p drain placement, now removed  Fluid culture showed E. Cloacae and E. 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 Followed by CT surgery      Physical deconditioning   Fatigue and weakness of LE, limiting physical activity, likely related to recent hospitalizations Will benefit from home PT, referred to home health Advised to continue using incentive spirometry      Relevant Orders   Ambulatory referral to Home Health   Acute blood loss anemia   Likely due to recent procedures, including laparoscopic cholecystectomy, followed by abdominal drain, thoracentesis and later VATS Hb: 7.9, but denies any active bleeding Of note, on Eliquis  due to history of A-fib Recheck CBC after 2 weeks Advised to get immediate medical attention if any signs of bleeding, such as melena, hematochezia, hematuria or hematemesis or signs of worsening dyspnea or fatigue       No orders of the defined types were placed in this encounter.   Follow-up: Return if symptoms  worsen or fail to improve.    [name], MD

## 2023-10-29 ENCOUNTER — Other Ambulatory Visit

## 2023-10-29 ENCOUNTER — Other Ambulatory Visit: Payer: Self-pay | Admitting: Thoracic Surgery (Cardiothoracic Vascular Surgery)

## 2023-10-29 DIAGNOSIS — J90 Pleural effusion, not elsewhere classified: Secondary | ICD-10-CM

## 2023-10-29 NOTE — Telephone Encounter (Signed)
Patient's daughter was advised.

## 2023-10-30 ENCOUNTER — Ambulatory Visit (INDEPENDENT_AMBULATORY_CARE_PROVIDER_SITE_OTHER)

## 2023-10-30 ENCOUNTER — Ambulatory Visit
Admission: RE | Admit: 2023-10-30 | Discharge: 2023-10-30 | Disposition: A | Discharge status: Home / Self Care | Source: Ambulatory Visit | Attending: Cardiovascular Disease | Admitting: Cardiovascular Disease

## 2023-10-30 VITALS — BP 133/65 | HR 62 | Resp 20 | Ht 66.0 in | Wt 183.1 lb

## 2023-10-30 DIAGNOSIS — Z48813 Encounter for surgical aftercare following surgery on the respiratory system: Secondary | ICD-10-CM | POA: Insufficient documentation

## 2023-10-30 DIAGNOSIS — Z9889 Other specified postprocedural states: Secondary | ICD-10-CM

## 2023-10-30 DIAGNOSIS — J90 Pleural effusion, not elsewhere classified: Secondary | ICD-10-CM | POA: Diagnosis not present

## 2023-10-30 DIAGNOSIS — R531 Weakness: Secondary | ICD-10-CM | POA: Insufficient documentation

## 2023-10-30 NOTE — Progress Notes (Signed)
 Mr. Granholm is a pleasant 78 year-old male with a past medical history of nicotine abuse (quit 50 years ago), HTN, HLD, DM Type 2, GERD, CAD, RA, and recurrent pleural effusion. Status post cholecystectomy 08/29/2023. History of drain placement for abdominal abscess following cholecystectomy. CT scaneof the chest/abd/pelvis showed an interval increase in size of loculated at least small volume right pleural effusion with hilar and mediastinal lymphadenopathy. He reports today for a follow up of a right-sided VATS with drainage of a recurrent pleural effusion and a decortication on 6/1. He admits that he is struggling to gain his strength back fully. However, he has been participating in activities of daily living (I.e. walking to the mail box) with no new or worsening symptoms. He denies any progression of his current lack of strength. Furthermore, he denies any new complaints.   Physical Exam:  General: Well appearing gentleman in no acute distress.  Cardiac: Normal S1 and S2. No rubs, murmurs, or gallops. Regular rate and rhythm on auscultation.  Pulmonary: L. Side - clear lungs sounds. R. Side - diminished in the lowers and clear in middle and upper fields. Weak effort when taking a deep breath. CXR reveals a stable right-sided pleural effusion. Pending radiologist review.  Skin/Derm: Incision sites are clean, dry, and intact. Two of the sites have stitches in them.     Assessment/Plan:  Stable right-sided pleural effusion. Continue incentive spirometry and increase physical activity as tolerated.  Removal of the two post-operative stitches.  Continue home medications as previously prescribed.

## 2023-10-30 NOTE — Telephone Encounter (Signed)
 I spoke with Dr. Venice Gillis and he has reconsidered.  Pleas schedule the patient with Dr. Venice Gillis to establish care for a hiatal hernia.    Joel Jones

## 2023-11-05 NOTE — Progress Notes (Signed)
 Office Visit Note  Patient: Joel Massenburg Allmendinger Sr.             Date of Birth: [birth year]           MRN: [id number]             PCP: [name], MD Referring: [name], MD Visit Date: 11/19/2023 Occupation: @GUAROCC @  Subjective:  Pain in multiple joints  History of Present Illness: Joel Seldon Tse Sr. is a 78 y.o. male with sero positive rheumatoid arthritis returns today after his last visit on June 01, 2023.  He underwent robotic assisted laparoscopic cholecystectomy and liver biopsy on September 05, 2023.  He went to the emergency room 4 days later due to abdominal pain.  The CT scan showed postoperative fluid collection.  He was discharged home and then again seen in the emergency room on Taboada 9, 2025 due to nausea and vomiting.  He was hospitalized and then discharged on Kneisley 13, 2025.  During the hospitalization he developed right upper quadrant intra-abdominal abscess causing duodenal and gastric outlet obstruction.  He was treated with IV antibiotics.  He was again evaluated in the emergency room on Strassman 23, 2025 due to right back pain from thoracentesis done by the IR.  He was again hospitalized for 9 days and discharged on October 17, 2023.  He had recurrent pleural effusion on the right.  The pleural fluid culture was negative.  He had a lot of deconditioning and anemia.  He had been seeing Dr. Vernetta for recurrent effusion of his right knee joint.  He was seen Dr. Vernetta on November 07, 2023 at the time his right knee joint was aspirated and injected with steroids.  He has been off Orencia  since May 23, 2023.  He came off Orencia  due to ablation required for atrial fibrillation.  He could not resume Orencia  since then.  He states his rheumatoid arthritis is flaring.  He complains of discomfort in shoulders, neck, hands, knees, ankles and his feet.  He has been experiencing intermittent swelling.   Activities of Daily Living:  Patient reports morning stiffness for  all day.   Patient  Reports nocturnal pain.  Difficulty dressing/grooming: Denies Difficulty climbing stairs: Reports Difficulty getting out of chair: Denies Difficulty using hands for taps, buttons, cutlery, and/or writing: Reports  Review of Systems  Constitutional:  Positive for fatigue.  HENT:  Negative for mouth sores and mouth dryness.   Eyes:  Negative for dryness.  Respiratory:  Negative for shortness of breath.   Cardiovascular:  Negative for chest pain and palpitations.  Gastrointestinal:  Negative for blood in stool, constipation and diarrhea.  Endocrine: Negative for increased urination.  Genitourinary:  Negative for involuntary urination.  Musculoskeletal:  Positive for joint pain, joint pain, joint swelling, myalgias, muscle weakness, morning stiffness, muscle tenderness and myalgias. Negative for gait problem.  Skin:  Negative for color change, rash, hair loss and sensitivity to sunlight.  Allergic/Immunologic: Negative for susceptible to infections.  Neurological:  Positive for headaches. Negative for dizziness.  Hematological:  Negative for swollen glands.  Psychiatric/Behavioral:  Positive for sleep disturbance. Negative for depressed mood. The patient is not nervous/anxious.     PMFS History:  Patient Active Problem List   Diagnosis Date Noted   Status post video-assisted thoracoscopic surgery (VATS) 10/25/2023   Physical deconditioning 10/25/2023   Acute blood loss anemia 10/25/2023   Recurrent pleural effusion on right 10/08/2023   S/P laparoscopic cholecystectomy 09/28/2023   [facility]  discharge follow-up 09/28/2023   Intra-abdominal fluid collection 09/21/2023   Biliary sludge 09/05/2023   Elevated liver enzymes 08/15/2023   Drug-induced liver injury 08/15/2023   Hyperlipidemia associated with type 2 diabetes mellitus (HCC) 02/05/2023   Hypercoagulable state due to persistent atrial fibrillation (HCC) 12/19/2022   Atrial fibrillation (HCC) 12/12/2022   Encounter for  therapeutic drug monitoring 12/12/2022   Acute mucoid otitis media of left ear 06/27/2022   Gastrocnemius muscle tear, left, subsequent encounter 06/27/2022   Allergic sinusitis 02/24/2022   Asthmatic bronchitis , chronic (HCC) 01/24/2022   Primary insomnia 10/06/2021   Gastroesophageal reflux disease 05/18/2021   Type 2 diabetes mellitus with other specified complication (HCC) 05/18/2021   Long term (current) use of insulin  (HCC) 05/18/2021   Obesity 05/18/2021   Elevated transaminase level 05/18/2021   Tear of left rotator cuff 09/24/2017   DDD (degenerative disc disease), cervical 10/31/2016   Primary osteoarthritis of both hands 10/31/2016   Primary osteoarthritis of both feet 10/31/2016   High risk medication use 07/06/2016   Rheumatoid arthritis involving multiple sites with positive rheumatoid factor (HCC) 06/02/2016   Contracture, elbow, left 06/02/2016   Osteoarthritis of right knee 08/27/2015   S/P TKR (total knee replacement), left 08/27/2015   Essential hypertension 05/11/2015   CAD (coronary artery disease) 04/02/2012   Sinus bradycardia     Past Medical History:  Diagnosis Date   A-fib (HCC)    AKI (acute kidney injury) (HCC) 05/18/2021   Anemia    Arthritis    all over   Bradycardia    Chronic bronchitis (HCC)    get it just about q yr (03/17/2014)   Complication of anesthesia    difficulty waking up   Daily headache    here lately (03/17/2014); relates to sinuses   GERD (gastroesophageal reflux disease)    Hard of hearing    hearing aides bilat   Hiatal hernia    History of blood transfusion 2008   related to OR   History of kidney stones    Hyperlipidemia    Hypertension    Pneumonia 1972 X 1   Rheumatoid arthritis (HCC)    Type II diabetes mellitus (HCC)    Wears glasses     Family History  Problem Relation Age of Onset   Cancer Father        prostate   Past Surgical History:  Procedure Laterality Date   ATRIAL FIBRILLATION ABLATION  N/A 05/23/2023   Procedure: ATRIAL FIBRILLATION ABLATION;  Surgeon: Nancey Eulas BRAVO, MD;  Location: [facility];  Service: Cardiovascular;  Laterality: N/A;   broken finger      surgical repaired left hand 2nd finger    CARDIOVERSION N/A 11/08/2022   Procedure: CARDIOVERSION;  Surgeon: Alvan Ronal BRAVO, MD;  Location: [facility];  Service: Cardiovascular;  Laterality: N/A;   CHOLECYSTECTOMY  09/05/2023   cyst removed      per left knee/posteriorly   CYSTECTOMY Left 2009   behind knee   INGUINAL HERNIA REPAIR Right ?2010   IR CV LINE INJECTION  10/02/2023   IR CV LINE INJECTION  10/10/2023   IR GUIDED DRAIN W CATHETER PLACEMENT  10/11/2023   IR RADIOLOGIST EVAL & MGMT  10/19/2023   IR THORACENTESIS ASP PLEURAL SPACE W/IMG GUIDE  10/03/2023   JOINT REPLACEMENT     KNEE ARTHROSCOPY Bilateral 1980's   LIVER BIOPSY N/A 09/05/2023   Procedure: BIOPSY, LIVER;  Surgeon: Mavis Anes, MD;  Location: [facility];  Service: General;  Laterality: N/A;   PARTIAL KNEE ARTHROPLASTY Right 08/27/2015   Procedure: RIGHT UNICOMPARTMENTAL KNEE ARTHROPLASTY;  Surgeon: Lonni CINDERELLA Poli, MD;  Location: [facility];  Service: Orthopedics;  Laterality: Right;   REVISION TOTAL KNEE ARTHROPLASTY Left 2008   SHOULDER SURGERY Left 2019   TOTAL KNEE ARTHROPLASTY Left 2003   VIDEO ASSISTED THORACOSCOPY (VATS)/DECORTICATION Right 10/14/2023   Procedure: VIDEO ASSISTED THORACOSCOPY (VATS)/DECORTICATION;  Surgeon: Kerrin Elspeth BROCKS, MD;  Location: [facility] OR;  Service: Thoracic;  Laterality: Right;  DRAINAGE OF PLEURAL EFFUSION   Social History   Social History Narrative   Patient lives at home with family.   Caffeine Use: occasionally   Immunization History  Administered Date(s) Administered   Pneumococcal Conjugate-13 01/13/2014   Pneumococcal Polysaccharide-23 05/15/2006, 09/16/2012, 03/29/2018     Objective: Vital Signs: BP 137/66 (BP Location: Left Arm, Patient Position: Sitting, Cuff  Size: Normal)   Pulse (!) 50   Resp 16   Ht 5' 6 (1.676 m)   Wt 189 lb 3.2 oz (85.8 kg)   BMI 30.54 kg/m    Physical Exam Vitals and nursing note reviewed.  Constitutional:      Appearance: He is well-developed.  HENT:     Head: Normocephalic and atraumatic.  Eyes:     Conjunctiva/sclera: Conjunctivae normal.     Pupils: Pupils are equal, round, and reactive to light.  Cardiovascular:     Rate and Rhythm: Normal rate and regular rhythm.     Heart sounds: Normal heart sounds.  Pulmonary:     Effort: Pulmonary effort is normal.     Comments: Diminished breath sounds in the right lung Abdominal:     General: Bowel sounds are normal.     Palpations: Abdomen is soft.  Musculoskeletal:     Cervical back: Normal range of motion and neck supple.  Skin:    General: Skin is warm and dry.     Capillary Refill: Capillary refill takes less than 2 seconds.  Neurological:     Mental Status: He is alert and oriented to person, place, and time.  Psychiatric:        Behavior: Behavior normal.      Musculoskeletal Exam: Patient had limited Lacroce of the cervical spine.  There was no tenderness over thoracic or lumbar spine.  Shoulders, elbows, wrist joints in good range of motion.  He had bilateral CMC PIP and DIP thickening.  MCP thickening with no synovitis was noted.  Hip joints in good range of motion.  Right knee joint had limited painful range of motion.  Left knee joint was replaced and was in good range of motion.  There was no tenderness over ankles or MTPs.  CDAI Exam: CDAI Score: -- Patient Global: --; Provider Global: -- Swollen: --; Tender: -- Joint Exam 11/19/2023   No joint exam has been documented for this visit   There is currently no information documented on the homunculus. Go to the Rheumatology activity and complete the homunculus joint exam.  Investigation: No additional findings.  Imaging: ECHOCARDIOGRAM COMPLETE Result Date: 11/15/2023    ECHOCARDIOGRAM  REPORT   Patient Name:   Haedyn Ancrum Steadman Sr. Date of Exam: 11/15/2023 Medical Rec #:  [id number]        Height:       66.0 in Accession #:    [id number]       Weight:       187.0 lb Date of Birth:  October 16, 1945  BSA:          1.943 m Patient Age:    77 years         BP:           126/70 mmHg Patient Gender: M                HR:           49 bpm. Exam Location:  [address] Procedure: 2D Echo, 3D Echo, Cardiac Doppler and Color Doppler (Both Spectral            and Color Flow Doppler were utilized during procedure). Indications:    I07.9 Tricuspid Valve Disorder  History:        Patient has prior history of Echocardiogram examinations, most                 recent 11/27/2022. Abnormal ECG, Arrythmias:Bradycardia, Atrial                 Fibrillation and Status post Ablation; Risk Factors:Former                 Smoker, Dyslipidemia, Diabetes and Hypertension. Status Post                 Cholecystectomy, Complicated by Pleural Effusion, GERD.  Sonographer:    Heather Hawks RDCS Referring Phys: REDELL RAMAN CRENSHAW IMPRESSIONS  1. Left ventricular ejection fraction, by estimation, is >75%. Left ventricular ejection fraction by 3D volume is 80 %. The left ventricle has hyperdynamic function. The left ventricle has no regional wall motion abnormalities. Left ventricular diastolic parameters are consistent with Grade II diastolic dysfunction (pseudonormalization).  2. Right ventricular systolic function is hyperdynamic. The right ventricular size is normal. There is moderately elevated pulmonary artery systolic pressure. The estimated right ventricular systolic pressure is 47.4 mmHg.  3. Left atrial size was severely dilated.  4. Right atrial size was moderately dilated.  5. The mitral valve is grossly normal. Trivial mitral valve regurgitation.  6. Tricuspid valve regurgitation is moderate.  7. The aortic valve is tricuspid. Aortic valve regurgitation is mild. Aortic valve sclerosis is present, with no evidence of aortic  valve stenosis.  8. The inferior vena cava is normal in size with <50% respiratory variability, suggesting right atrial pressure of 8 mmHg. Comparison(s): Changes from prior study are noted. 11/27/2022: LVEF 60-65%, moderate to severe TR. FINDINGS  Left Ventricle: Left ventricular ejection fraction, by estimation, is >75%. Left ventricular ejection fraction by 3D volume is 80 %. The left ventricle has hyperdynamic function. The left ventricle has no regional wall motion abnormalities. The left ventricular internal cavity size was normal in size. There is no left ventricular hypertrophy. Left ventricular diastolic parameters are consistent with Grade II diastolic dysfunction (pseudonormalization). Indeterminate filling pressures. Right Ventricle: The right ventricular size is normal. No increase in right ventricular wall thickness. Right ventricular systolic function is hyperdynamic. There is moderately elevated pulmonary artery systolic pressure. The tricuspid regurgitant velocity is 3.14 m/s, and with an assumed right atrial pressure of 8 mmHg, the estimated right ventricular systolic pressure is 47.4 mmHg. Left Atrium: Left atrial size was severely dilated. Right Atrium: Right atrial size was moderately dilated. Pericardium: There is no evidence of pericardial effusion. Mitral Valve: The mitral valve is grossly normal. Trivial mitral valve regurgitation. Tricuspid Valve: The tricuspid valve is grossly normal. Tricuspid valve regurgitation is moderate. Aortic Valve: The aortic valve is tricuspid. Aortic valve regurgitation is mild. Aortic valve sclerosis is present, with no  evidence of aortic valve stenosis. Pulmonic Valve: The pulmonic valve was grossly normal. Pulmonic valve regurgitation is trivial. Aorta: The aortic root and ascending aorta are structurally normal, with no evidence of dilitation. Venous: The inferior vena cava is normal in size with less than 50% respiratory variability, suggesting right atrial  pressure of 8 mmHg. IAS/Shunts: No atrial level shunt detected by color flow Doppler. Additional Comments: 3D was performed not requiring image post processing on an independent workstation and was normal.  LEFT VENTRICLE PLAX 2D LVIDd:         5.50 cm         Diastology LVIDs:         3.00 cm         LV e' medial:    9.90 cm/s LV PW:         1.00 cm         LV E/e' medial:  9.7 LV IVS:        0.60 cm         LV e' lateral:   13.50 cm/s LVOT diam:     2.40 cm         LV E/e' lateral: 7.1 LV SV:         101 LV SV Index:   52 LVOT Area:     4.52 cm        3D Volume EF                                LV 3D EF:    Left                                             ventricul                                             ar                                             ejection                                             fraction                                             by 3D                                             volume is                                             80 %.  3D Volume EF:                                3D EF:        80 %                                LV EDV:       203 ml                                LV ESV:       41 ml                                LV SV:        162 ml RIGHT VENTRICLE RV Basal diam:  4.80 cm RV Mid diam:    3.50 cm RV S prime:     21.40 cm/s TAPSE (M-mode): 2.8 cm RVSP:           47.4 mmHg LEFT ATRIUM              Index        RIGHT ATRIUM           Index LA diam:        5.10 cm  2.62 cm/m   RA Pressure: 8.00 mmHg LA Vol (A2C):   113.0 ml 58.14 ml/m  RA Area:     25.30 cm LA Vol (A4C):   107.0 ml 55.06 ml/m  RA Volume:   92.30 ml  47.49 ml/m LA Biplane Vol: 112.0 ml 57.63 ml/m  AORTIC VALVE LVOT Vmax:   90.90 cm/s LVOT Vmean:  58.450 cm/s LVOT VTI:    0.222 m  AORTA Ao Root diam: 3.60 cm Ao Asc diam:  3.60 cm MITRAL VALVE               TRICUSPID VALVE MV Area (PHT): cm         TR Peak grad:   39.4 mmHg MV Decel Time: 225 msec    TR Vmax:         314.00 cm/s MV E velocity: 95.65 cm/s  Estimated RAP:  8.00 mmHg MV A velocity: 47.15 cm/s  RVSP:           47.4 mmHg MV E/A ratio:  2.03                            SHUNTS                            Systemic VTI:  0.22 m                            Systemic Diam: 2.40 cm Vinie Maxcy MD Electronically signed by Vinie Maxcy MD Signature Date/Time: 11/15/2023/2:37:24 PM    Final    DG Chest 2 View Result Date: 10/30/2023 CLINICAL DATA:  Recurrent right pleural effusion EXAM: CHEST - 2 VIEW COMPARISON:  10/17/2023 FINDINGS: Frontal and lateral views of the chest demonstrate a stable cardiac silhouette. There is stable right pleural thickening versus small right pleural effusion. No airspace disease or pneumothorax. No  acute bony abnormalities. IMPRESSION: 1. Stable small right pleural effusion versus pleural thickening. Electronically Signed   By: Ozell Daring M.D.   On: 10/30/2023 13:55    Recent Labs: Lab Results  Component Value Date   WBC 9.2 11/08/2023   HGB 11.5 (L) 11/08/2023   PLT 317 11/08/2023   NA 136 11/08/2023   K 4.8 11/08/2023   CL 100 11/08/2023   CO2 22 11/08/2023   GLUCOSE 258 (H) 11/08/2023   BUN 26 11/08/2023   CREATININE 1.03 11/08/2023   BILITOT 0.5 11/08/2023   ALKPHOS 163 (H) 11/08/2023   AST 21 11/08/2023   ALT 16 11/08/2023   PROT 6.7 11/08/2023   ALBUMIN 4.2 11/08/2023   CALCIUM 9.5 11/08/2023   GFRAA >60 01/26/2020   QFTBGOLDPLUS Negative 07/19/2022     Speciality Comments: Orencia  750mg  every 4 weeks TB gold negative on Jan 2019  Procedures:  No procedures performed Allergies: Codeine, Lipitor [atorvastatin], Invokana [canagliflozin], Losartan, Roxicodone  [oxycodone ], and Vicodin [hydrocodone -acetaminophen ]   Assessment / Plan:     Visit Diagnoses: Rheumatoid arthritis involving multiple sites with positive rheumatoid factor (HCC)-patient complains of pain and discomfort in multiple joints today.  He has been off Orencia  since January 2025 due to  multiple procedures and then recurrent hospitalizations.  He complains of discomfort in neck, shoulders, hands, hips, knees, ankles and his feet.  No synovitis was noted on the examination.  He had recent injection of his right knee joint by Dr. Vernetta.  I do not see any synovitis on the examination today.  I had a detailed discussion with the patient with the recurrent infections and hospitalizations I would like to hold off Orencia  for now.  Advised him to contact me if he develops any swelling.  I will reach assess the situation in 2 months.  I also sent a message to Dr. Tobie regarding clearance to start him on immunosuppressive agents.  High risk medication use -(IV Orencia  750 mg infusions every 28 days-on hold since January 2025)- November 08, 2023 WBC 9.2, hemoglobin 11.5, platelets 317, CMP alk phos 163, AST 21, ALT 16, calcium 9.5, creatinine 1.03 May 07, 2023 hemoglobin A1c 8.3 November 19, 2023 lipid panel LDL 71, triglycerides 156Plan: QuantiFERON-TB Gold Plus  Primary osteoarthritis of both hands-he has bilateral PIP and DIP thickening suggestive of osteoarthritis.  Use of Tylenol  for pain management was discussed.  Status post right partial knee replacement - Dr. Vernetta.  Patient had recent cortisone injection by Dr. Vernetta and continues to have pain and discomfort in his right knee.  S/P TKR (total knee replacement), left - X 2 by Dr. Lucious.  Doing well.  Primary osteoarthritis of both feet-he denies any discomfort.  DDD (degenerative disc disease), cervical-he limited range of motion cervical spine.  Recurrent pleural effusion on right-he has been having recurrent right pleural effusion since the hospitalization after the abscess formation.  Shortness of breath -he complains of shortness of breath on exertion.  He had diminished breath sounds on the right side up to the midlung.  I will send him for chest x-ray PA and lateral.  I also advised him to schedule an appointment with  Dr. Tobie for follow-up.  Plan: DG Chest 2 View  Degeneration of intervertebral disc of lumbar region without discogenic back pain or lower extremity pain - Followed by Dr. Burnetta.  History of diabetes mellitus-hemoglobin A1c was elevated at 8.3 on May 07, 2023.  History of hypertension-blood pressure was normal at 137/66 today.  History  of hyperlipidemia-lipid panel was normal on November 19, 2023.  History of coronary artery disease  Current use of long term anticoagulation  Atrial fibrillation, unspecified type (HCC) - underwent ablation recently.  He is on warfarin.  Chronic insomnia  Other fatigue  Orders: Orders Placed This Encounter  Procedures   DG Chest 2 View   QuantiFERON-TB Gold Plus   No orders of the defined types were placed in this encounter.    Follow-Up Instructions: Return in about 2 months (around 01/20/2024) for Rheumatoid arthritis.   Maya Nash, MD  Note - This record has been created using Animal nutritionist.  Chart creation errors have been sought, but [place] not always  have been located. Such creation errors do not reflect on  the standard of medical care.

## 2023-11-06 ENCOUNTER — Ambulatory Visit (INDEPENDENT_AMBULATORY_CARE_PROVIDER_SITE_OTHER): Admitting: General Surgery

## 2023-11-06 ENCOUNTER — Encounter: Payer: Self-pay | Admitting: General Surgery

## 2023-11-06 VITALS — BP 126/70 | HR 54 | Temp 97.7°F | Resp 16 | Ht 66.0 in | Wt 187.0 lb

## 2023-11-06 DIAGNOSIS — Z09 Encounter for follow-up examination after completed treatment for conditions other than malignant neoplasm: Secondary | ICD-10-CM

## 2023-11-06 NOTE — Progress Notes (Signed)
 Subjective:     Joel Gatling Robley Sr.  Patient is here for follow-up.  Patient states his appetite is improving.  He does have some incisional pain from his VATS procedure.  He denies any nausea or vomiting.  He denies any abdominal pain. We reviewed his whole postoperative course. Objective:    BP 126/70   Pulse (!) 54   Temp 97.7 F (36.5 C) (Oral)   Resp 16   Ht 5' 6 (1.676 m)   Wt 187 lb (84.8 kg)   SpO2 95%   BMI 30.18 kg/m   General:  alert, cooperative, and no distress  Abdomen is soft, incisions healing well.     Assessment:    Doing well postoperatively.    Plan:   Patient has had a long complicated course after his cholecystectomy.  He was with Sari, his daughter.  They seem fully aware of his course and are happy that he is finally home with resolution of his issues.  I told him should any problems arise, he Floyd contact me.  Follow-up here as needed.

## 2023-11-07 ENCOUNTER — Ambulatory Visit (INDEPENDENT_AMBULATORY_CARE_PROVIDER_SITE_OTHER): Admitting: Orthopaedic Surgery

## 2023-11-07 ENCOUNTER — Encounter: Payer: Self-pay | Admitting: Orthopaedic Surgery

## 2023-11-07 DIAGNOSIS — M1711 Unilateral primary osteoarthritis, right knee: Secondary | ICD-10-CM | POA: Diagnosis not present

## 2023-11-07 DIAGNOSIS — M25461 Effusion, right knee: Secondary | ICD-10-CM | POA: Diagnosis not present

## 2023-11-07 MED ORDER — METHYLPREDNISOLONE ACETATE 40 MG/ML IJ SUSP
40.0000 mg | INTRAMUSCULAR | Status: AC | PRN
  Administered 2023-11-07: 40 mg via INTRA_ARTICULAR

## 2023-11-07 MED ORDER — LIDOCAINE HCL 1 % IJ SOLN
3.0000 mL | INTRAMUSCULAR | Status: AC | PRN
  Administered 2023-11-07: 3 mL

## 2023-11-07 NOTE — Progress Notes (Signed)
 [name] comes in today with a recurrent effusion of his right knee with known osteoarthritis of the patellofemoral joint and the lateral compartment of his knee.  He has a history of a unicompartmental knee on the medial side.  He has been getting over complicated gallbladder surgery and having to have a thoracentesis.  He looks better and feels better overall but his right knee is bothering him quite a bit and it is swollen.  There is moderate effusion with the right knee and I was able to aspirate 25 to 30 cc of clear fluid from his knee consistent with his osteoarthritis.  I then placed a sterile injection in his right knee and he felt much better after that.  At some point when he is doing much better clinically and medically we can consider again setting up for revision knee surgery.  He knows to wait at least 3 months between steroid injections minimally.    Procedure Note  Patient: Joel Wolgamott Neenan Sr.             Date of Birth: 05/28/1945           MRN: [id number]             Visit Date: 11/07/2023  Procedures: Visit Diagnoses:  1. Effusion, right knee   2. Primary osteoarthritis of right knee     Large Joint Inj: R knee on 11/07/2023 3:07 PM Indications: diagnostic evaluation and pain Details: 22 G 1.5 in needle, superolateral approach  Arthrogram: No  Medications: 3 mL lidocaine  1 %; 40 mg methylPREDNISolone  acetate 40 MG/ML Outcome: tolerated well, no immediate complications Procedure, treatment alternatives, risks and benefits explained, specific risks discussed. Consent was given by the patient. Immediately prior to procedure a time out was called to verify the correct patient, procedure, equipment, support staff and site/side marked as required. Patient was prepped and draped in the usual sterile fashion.

## 2023-11-09 ENCOUNTER — Other Ambulatory Visit: Payer: Self-pay | Admitting: Orthopaedic Surgery

## 2023-11-09 ENCOUNTER — Ambulatory Visit: Payer: Self-pay | Admitting: Internal Medicine

## 2023-11-09 ENCOUNTER — Telehealth: Payer: Self-pay | Admitting: Orthopaedic Surgery

## 2023-11-09 LAB — CMP14+EGFR
ALT: 16 IU/L (ref 0–44)
AST: 21 IU/L (ref 0–40)
Albumin: 4.2 g/dL (ref 3.8–4.8)
Alkaline Phosphatase: 163 IU/L — ABNORMAL HIGH (ref 44–121)
BUN/Creatinine Ratio: 25 — ABNORMAL HIGH (ref 10–24)
BUN: 26 mg/dL (ref 8–27)
Bilirubin Total: 0.5 mg/dL (ref 0.0–1.2)
CO2: 22 mmol/L (ref 20–29)
Calcium: 9.5 mg/dL (ref 8.6–10.2)
Chloride: 100 mmol/L (ref 96–106)
Creatinine, Ser: 1.03 mg/dL (ref 0.76–1.27)
Globulin, Total: 2.5 g/dL (ref 1.5–4.5)
Glucose: 258 mg/dL — ABNORMAL HIGH (ref 70–99)
Potassium: 4.8 mmol/L (ref 3.5–5.2)
Sodium: 136 mmol/L (ref 134–144)
Total Protein: 6.7 g/dL (ref 6.0–8.5)
eGFR: 75 mL/min/{1.73_m2} (ref >59)

## 2023-11-09 LAB — CBC
Hematocrit: 37.3 % — ABNORMAL LOW (ref 37.5–51.0)
Hemoglobin: 11.5 g/dL — ABNORMAL LOW (ref 13.0–17.7)
MCH: 29.1 pg (ref 26.6–33.0)
MCHC: 30.8 g/dL — ABNORMAL LOW (ref 31.5–35.7)
MCV: 94 fL (ref 79–97)
Platelets: 317 10*3/uL (ref 150–450)
RBC: 3.95 x10E6/uL — ABNORMAL LOW (ref 4.14–5.80)
RDW: 14.1 % (ref 11.6–15.4)
WBC: 9.2 10*3/uL (ref 3.4–10.8)

## 2023-11-09 MED ORDER — TRAMADOL HCL 50 MG PO TABS: 50.0000 mg | ORAL_TABLET | Freq: Three times a day (TID) | ORAL | 0 refills | Status: DC | PRN

## 2023-11-09 NOTE — Telephone Encounter (Signed)
 Called and left voicemail for patient to call back and schedule appointment with Dr. Charlanne. NO APP.

## 2023-11-09 NOTE — Telephone Encounter (Signed)
 Sari called triage phone requesting patient needs refill for tramadol  due to his knee pain. Patient was seen in office this week for this issue.

## 2023-11-12 ENCOUNTER — Telehealth: Payer: Self-pay

## 2023-11-12 LAB — HEMOGLOBIN A1C: Hemoglobin A1C: 6.5

## 2023-11-12 LAB — PROTEIN / CREATININE RATIO, URINE
Albumin, U: 1.65
Creatinine, Urine: 149

## 2023-11-12 LAB — MICROALBUMIN / CREATININE URINE RATIO: Microalb Creat Ratio: 11.01

## 2023-11-12 LAB — MICROALBUMIN, URINE: Microalb, Ur: 1.65

## 2023-11-12 NOTE — Telephone Encounter (Signed)
 Called [facility] to give verbal orders for physical therapy

## 2023-11-12 NOTE — Telephone Encounter (Signed)
 Copied from CRM [id number]. Topic: Clinical - Home Health Verbal Orders >> Nov 12, 2023 10:54 AM Travis FALCON wrote: Caller/Agency: [facility]- Ted Rushing Number: [phone], Secured line  Service Requested: Physical Therapy Frequency: 2 week 1 , 1 week 3, 0 week 1, 1 week 1  Any new concerns about the patient? No

## 2023-11-12 NOTE — Telephone Encounter (Signed)
 Called to give verbal orders and received the line is busy tone

## 2023-11-13 NOTE — Telephone Encounter (Signed)
 Second attempt, busy tone

## 2023-11-13 NOTE — Telephone Encounter (Signed)
 Called [facility] office due to not being able to get threw. Verbal orders provided to Alicia. She states they do not have this patient in there system but she will pass the message along to the PT branch.

## 2023-11-15 ENCOUNTER — Ambulatory Visit (HOSPITAL_COMMUNITY)
Admission: RE | Admit: 2023-11-15 | Discharge: 2023-11-15 | Disposition: A | Discharge status: Home / Self Care | Source: Ambulatory Visit | Attending: Cardiology | Admitting: Cardiology

## 2023-11-15 ENCOUNTER — Ambulatory Visit (INDEPENDENT_AMBULATORY_CARE_PROVIDER_SITE_OTHER): Admitting: Gastroenterology

## 2023-11-15 ENCOUNTER — Other Ambulatory Visit (HOSPITAL_COMMUNITY)

## 2023-11-15 DIAGNOSIS — I079 Rheumatic tricuspid valve disease, unspecified: Secondary | ICD-10-CM | POA: Insufficient documentation

## 2023-11-15 DIAGNOSIS — I369 Nonrheumatic tricuspid valve disorder, unspecified: Secondary | ICD-10-CM | POA: Diagnosis not present

## 2023-11-15 LAB — ECHOCARDIOGRAM COMPLETE
Area-P 1/2: 3.38 cm2
Est EF: >75
S' Lateral: 3 cm

## 2023-11-16 ENCOUNTER — Ambulatory Visit: Payer: Self-pay | Admitting: Cardiology

## 2023-11-19 ENCOUNTER — Ambulatory Visit: Payer: Medicare Other | Attending: Rheumatology | Admitting: Rheumatology

## 2023-11-19 ENCOUNTER — Encounter: Payer: Self-pay | Admitting: Rheumatology

## 2023-11-19 VITALS — BP 137/66 | HR 50 | Resp 16 | Ht 66.0 in | Wt 189.2 lb

## 2023-11-19 DIAGNOSIS — M19072 Primary osteoarthritis, left ankle and foot: Secondary | ICD-10-CM

## 2023-11-19 DIAGNOSIS — M51369 Other intervertebral disc degeneration, lumbar region without mention of lumbar back pain or lower extremity pain: Secondary | ICD-10-CM

## 2023-11-19 DIAGNOSIS — M0579 Rheumatoid arthritis with rheumatoid factor of multiple sites without organ or systems involvement: Secondary | ICD-10-CM | POA: Diagnosis not present

## 2023-11-19 DIAGNOSIS — Z7901 Long term (current) use of anticoagulants: Secondary | ICD-10-CM

## 2023-11-19 DIAGNOSIS — Z79899 Other long term (current) drug therapy: Secondary | ICD-10-CM

## 2023-11-19 DIAGNOSIS — F5104 Psychophysiologic insomnia: Secondary | ICD-10-CM

## 2023-11-19 DIAGNOSIS — M5030 Other cervical disc degeneration, unspecified cervical region: Secondary | ICD-10-CM

## 2023-11-19 DIAGNOSIS — Z96652 Presence of left artificial knee joint: Secondary | ICD-10-CM

## 2023-11-19 DIAGNOSIS — R0602 Shortness of breath: Secondary | ICD-10-CM

## 2023-11-19 DIAGNOSIS — Z96651 Presence of right artificial knee joint: Secondary | ICD-10-CM | POA: Diagnosis not present

## 2023-11-19 DIAGNOSIS — M19071 Primary osteoarthritis, right ankle and foot: Secondary | ICD-10-CM

## 2023-11-19 DIAGNOSIS — Z8639 Personal history of other endocrine, nutritional and metabolic disease: Secondary | ICD-10-CM

## 2023-11-19 DIAGNOSIS — J90 Pleural effusion, not elsewhere classified: Secondary | ICD-10-CM

## 2023-11-19 DIAGNOSIS — Z8679 Personal history of other diseases of the circulatory system: Secondary | ICD-10-CM

## 2023-11-19 DIAGNOSIS — R5383 Other fatigue: Secondary | ICD-10-CM

## 2023-11-19 DIAGNOSIS — I4891 Unspecified atrial fibrillation: Secondary | ICD-10-CM

## 2023-11-19 DIAGNOSIS — M19042 Primary osteoarthritis, left hand: Secondary | ICD-10-CM

## 2023-11-19 DIAGNOSIS — M19041 Primary osteoarthritis, right hand: Secondary | ICD-10-CM

## 2023-11-20 ENCOUNTER — Ambulatory Visit: Payer: Self-pay | Admitting: Rheumatology

## 2023-11-20 ENCOUNTER — Other Ambulatory Visit: Payer: Self-pay | Admitting: Internal Medicine

## 2023-11-20 ENCOUNTER — Ambulatory Visit
Admission: RE | Admit: 2023-11-20 | Discharge: 2023-11-20 | Disposition: A | Discharge status: Home / Self Care | Source: Ambulatory Visit | Attending: Rheumatology | Admitting: Rheumatology

## 2023-11-20 ENCOUNTER — Ambulatory Visit: Admitting: Internal Medicine

## 2023-11-20 DIAGNOSIS — R0602 Shortness of breath: Secondary | ICD-10-CM

## 2023-11-20 DIAGNOSIS — J90 Pleural effusion, not elsewhere classified: Secondary | ICD-10-CM

## 2023-11-20 DIAGNOSIS — J4489 Other specified chronic obstructive pulmonary disease: Secondary | ICD-10-CM

## 2023-11-20 NOTE — Progress Notes (Signed)
 Stable small right pleural effusion was noted per chest x-ray report.  Pleural thickening was noted per radiology report.  Please forward the report to his PCP.  Please notify patient that his PCP plans to refer him to pulmonologist.

## 2023-11-21 ENCOUNTER — Encounter: Payer: Self-pay | Admitting: Rheumatology

## 2023-11-21 LAB — QUANTIFERON-TB GOLD PLUS
Mitogen-NIL: >10 [IU]/mL
NIL: 0.14 [IU]/mL
QuantiFERON-TB Gold Plus: NEGATIVE
TB1-NIL: <0 [IU]/mL
TB2-NIL: <0 [IU]/mL

## 2023-11-21 NOTE — Progress Notes (Signed)
TB Gold negative

## 2023-11-23 NOTE — Telephone Encounter (Signed)
 Please advise patient to restart Orencia .  He got an approval from Dr. Kerrin.

## 2023-11-28 ENCOUNTER — Ambulatory Visit

## 2023-11-28 VITALS — BP 120/60 | Ht 66.0 in | Wt 182.0 lb

## 2023-11-28 DIAGNOSIS — Z0000 Encounter for general adult medical examination without abnormal findings: Secondary | ICD-10-CM | POA: Diagnosis not present

## 2023-11-28 NOTE — Patient Instructions (Addendum)
 [name] ,  Thank you for taking time out of your busy schedule to complete your Annual Wellness Visit with me. I enjoyed our conversation and look forward to speaking with you again next year. I, as well as your care team,  appreciate your ongoing commitment to your health goals. Please review the following plan we discussed and let me know if I can assist you in the future.  I enjoyed our conversation and look forward to it again next year. Blessing for the upcoming year!!  -Sincerity Cedar  Your To Do List   Referrals Placed:  No referrals placed during todays visit.  Follow up Visits:  Next Office Visit with your Primary Care Provider: January 01, 2024 at 11:00 am in office Medicare Wellness with Health Advisor (1 year): December 01, 2024 at 12:30 pm telephone visit    Clinician Recommendations:  Aim for 30 minutes of exercise or brisk walking, 6-8 glasses of water , and 5 servings of fruits and vegetables each day.       This is a list of the screening recommended for you and due dates:  Health Maintenance  Topic Date Due   COVID-19 Vaccine (1) Never done   DTaP/Tdap/Td vaccine (1 - Tdap) Never done   Zoster (Shingles) Vaccine (1 of 2) Never done   Flu Shot  12/14/2023   Eye exam for diabetics  02/05/2024   Hemoglobin A1C  05/13/2024   Complete foot exam   07/17/2024   Yearly kidney function blood test for diabetes  11/07/2024   Yearly kidney health urinalysis for diabetes  11/11/2024   Medicare Annual Wellness Visit  11/27/2024   Pneumococcal Vaccine for age over 87  Completed   Hepatitis C Screening  Completed   Hepatitis B Vaccine  Aged Out   HPV Vaccine  Aged Out   Meningitis B Vaccine  Aged Out   Colon Cancer Screening  Discontinued    Advanced directives: (Provided) Advance directive discussed with you today. I have provided a copy for you to complete at home and have notarized. Once this is complete, please bring a copy in to our office so we can scan it into your chart.  Advance  Care Planning is important because it:  [x]  Makes sure you receive the medical care that is consistent with your values, goals, and preferences  [x]  It provides guidance to your family and loved ones and reduces their decisional burden about whether or not they are making the right decisions based on your wishes.  Follow the link provided in your after visit summary or read over the paperwork we have mailed to you to help you started getting your Advance Directives in place. If you need assistance in completing these, please reach out to us  so that we can help you!  We will mail you an Advanced Directives packet as we discussed during your visit today. You do not have to have an attorney to complete this paperwork. Read over the packet, discuss it with family, and complete it. Choose who will be making decisions for you in the event you can no longer make them for yourself. Once completed have them notarized, and bring the packet back to the office. We will scan it and make sure it gets into your chart.   If you choose to have a DNR (Do Not Resuscitate Order) you must get this from your primary care doctor because they have to sign it. You can get this in the office during an appointment.  THIS ORDER MUST BE VISIBLE AT ALL TIMES WITHIN YOUR HOME SUCH AS ON A REFRIGERATOR.

## 2023-11-28 NOTE — Progress Notes (Signed)
 Subjective:   Joel BROCKS Barga Sr. is a 78 y.o. who presents for a Medicare Wellness preventive visit.  As a reminder, Annual Wellness Visits don't include a physical exam, and some assessments Flammia be limited, especially if this visit is performed virtually. We Chlebowski recommend an in-person follow-up visit with your provider if needed.  Visit Complete: Virtual I connected with  Joel BROCKS Demchak Sr. on 11/28/23 by a audio enabled telemedicine application and verified that I am speaking with the correct person using two identifiers.  Patient Location: Home  Provider Location: Home Office  I discussed the limitations of evaluation and management by telemedicine. The patient expressed understanding and agreed to proceed.  Vital Signs: Because this visit was a virtual/telehealth visit, some criteria Benfer be missing or patient reported. Any vitals not documented were not able to be obtained and vitals that have been documented are patient reported.  VideoDeclined- This patient declined Librarian, academic. Therefore the visit was completed with audio only.  Persons Participating in Visit: Patient.  AWV Questionnaire: No: Patient Medicare AWV questionnaire was not completed prior to this visit.  Cardiac Risk Factors include: advanced age (>64men, >28 women);diabetes mellitus;dyslipidemia;hypertension;male gender;Other (see comment), Risk factor comments: AFib     Objective:    Today's Vitals   11/28/23 1054 11/28/23 1055  BP: 120/60   Weight: 182 lb (82.6 kg)   Height: 5' 6 (1.676 m)   PainSc:  5    Body mass index is 29.38 kg/m.     11/28/2023   11:03 AM 10/08/2023    6:28 PM 10/05/2023    7:28 PM 10/05/2023    9:23 AM 09/24/2023    7:32 AM 09/09/2023    8:08 AM 09/05/2023    8:42 AM  Advanced Directives  Does Patient Have a Medical Advance Directive? No No No No No No No  Would patient like information on creating a medical advance directive? Yes  (MAU/Ambulatory/Procedural Areas - Information given) Yes (Inpatient - patient requests chaplain consult to create a medical advance directive)  No - Patient declined No - Patient declined No - Patient declined     Current Medications (verified) Outpatient Encounter Medications as of 11/28/2023  Medication Sig   Accu-Chek FastClix Lancets MISC use to check blood sugar fingerstick Six times a day IC-10 CODE: E11.65; Duration: 90 days   albuterol  (PROVENTIL ) (2.5 MG/3ML) 0.083% nebulizer solution Take 3 mLs (2.5 mg total) by nebulization every 6 (six) hours as needed for wheezing or shortness of breath.   albuterol  (VENTOLIN  HFA) 108 (90 Base) MCG/ACT inhaler Inhale 2 puffs into the lungs every 6 (six) hours as needed for wheezing or shortness of breath.   amLODipine  (NORVASC ) 5 MG tablet Take 1 tablet (5 mg total) by mouth daily.   apixaban  (ELIQUIS ) 5 MG TABS tablet Take 1 tablet (5 mg total) by mouth 2 (two) times daily.   ascorbic acid  (VITAMIN C ) 500 MG tablet Take 500 mg by mouth in the morning.   calcium carbonate (OSCAL) 1500 (600 Ca) MG TABS tablet Take 600 mg of elemental calcium by mouth in the morning.   calcium carbonate (TUMS - DOSED IN MG ELEMENTAL CALCIUM) 500 MG chewable tablet Chew 1 tablet by mouth as needed for indigestion or heartburn.   cholecalciferol (VITAMIN D3) 25 MCG (1000 UNIT) tablet Take 1,000 Units by mouth in the morning.   Glucose Blood (ACCU-CHEK GUIDE VI) check blood sugar In Vitro Six times a day IC-10 CODE: E11.65; Duration:  100 days   Insulin  Aspart, w/Niacinamide, (FIASP ) 100 UNIT/ML SOLN 77 Units continuous. Insulin  pump   insulin  glargine (LANTUS ) 100 UNIT/ML injection Inject 16 Units into the skin as needed (If pump goes out).   KRILL OIL PO Take 400 mg by mouth in the morning.   loratadine  (CLARITIN ) 10 MG tablet Take 10 mg by mouth in the morning.   LYUMJEV  100 UNIT/ML SOLN 77 units/a day Injection in Medtronic insulin  pump ICD-10 diagnosis code E11.65    Multiple Vitamin (MULTIVITAMIN WITH MINERALS) TABS Take 1 tablet by mouth in the morning. Centrum   ondansetron  (ZOFRAN -ODT) 4 MG disintegrating tablet Take 1 tablet (4 mg total) by mouth every 8 (eight) hours as needed for nausea or vomiting.   ONE TOUCH ULTRA TEST test strip 1 each daily. Pt says 2-3 times daily   pantoprazole  (PROTONIX ) 40 MG tablet TAKE 1 TABLET BY MOUTH DAILY   Potassium 99 MG TABS Take 99 mg by mouth in the morning.   pravastatin  (PRAVACHOL ) 40 MG tablet Take 40 mg by mouth in the morning.   pyridOXINE (VITAMIN B6) 50 MG tablet Take 50 mg by mouth in the morning.   ramipril  (ALTACE ) 5 MG capsule Take 1 capsule (5 mg total) by mouth daily.   traMADol  (ULTRAM ) 50 MG tablet Take 1-2 tablets (50-100 mg total) by mouth 3 (three) times daily as needed for moderate pain (pain score 4-6).   vitamin E  400 UNIT capsule Take 400 Units by mouth in the morning.   zinc gluconate 50 MG tablet Take 50 mg by mouth in the morning.   Abatacept  (ORENCIA  IV) Inject 750 mg into the vein every 28 (twenty-eight) days. (Patient not taking: Reported on 11/28/2023)   No facility-administered encounter medications on file as of 11/28/2023.    Allergies (verified) Codeine, Lipitor [atorvastatin], Invokana [canagliflozin], Losartan, Roxicodone  [oxycodone ], and Vicodin [hydrocodone -acetaminophen ]   History: Past Medical History:  Diagnosis Date   A-fib (HCC)    AKI (acute kidney injury) (HCC) 05/18/2021   Allergy    Anemia    Arthritis    all over   Bradycardia    Chronic bronchitis (HCC)    get it just about q yr (03/17/2014)   Complication of anesthesia    difficulty waking up   Daily headache    here lately (03/17/2014); relates to sinuses   GERD (gastroesophageal reflux disease)    Hard of hearing    hearing aides bilat   Hiatal hernia    History of blood transfusion 2008   related to OR   History of kidney stones    Hyperlipidemia    Hypertension    Pneumonia 1972 X 1    Rheumatoid arthritis (HCC)    Type II diabetes mellitus (HCC)    Wears glasses    Past Surgical History:  Procedure Laterality Date   ATRIAL FIBRILLATION ABLATION N/A 05/23/2023   Procedure: ATRIAL FIBRILLATION ABLATION;  Surgeon: Nancey Eulas BRAVO, MD;  Location: [facility];  Service: Cardiovascular;  Laterality: N/A;   broken finger      surgical repaired left hand 2nd finger    CARDIOVERSION N/A 11/08/2022   Procedure: CARDIOVERSION;  Surgeon: Alvan Ronal BRAVO, MD;  Location: [facility];  Service: Cardiovascular;  Laterality: N/A;   CHOLECYSTECTOMY  09/05/2023   cyst removed      per left knee/posteriorly   CYSTECTOMY Left 2009   behind knee   INGUINAL HERNIA REPAIR Right ?2010   IR CV LINE INJECTION  10/02/2023   IR CV LINE INJECTION  10/10/2023   IR GUIDED DRAIN W CATHETER PLACEMENT  10/11/2023   IR RADIOLOGIST EVAL & MGMT  10/19/2023   IR THORACENTESIS ASP PLEURAL SPACE W/IMG GUIDE  10/03/2023   JOINT REPLACEMENT     KNEE ARTHROSCOPY Bilateral 1980's   LIVER BIOPSY N/A 09/05/2023   Procedure: BIOPSY, LIVER;  Surgeon: Mavis Anes, MD;  Location: [facility];  Service: General;  Laterality: N/A;   PARTIAL KNEE ARTHROPLASTY Right 08/27/2015   Procedure: RIGHT UNICOMPARTMENTAL KNEE ARTHROPLASTY;  Surgeon: Lonni CINDERELLA Poli, MD;  Location: [facility];  Service: Orthopedics;  Laterality: Right;   REVISION TOTAL KNEE ARTHROPLASTY Left 2008   SHOULDER SURGERY Left 2019   TOTAL KNEE ARTHROPLASTY Left 2003   VIDEO ASSISTED THORACOSCOPY (VATS)/DECORTICATION Right 10/14/2023   Procedure: VIDEO ASSISTED THORACOSCOPY (VATS)/DECORTICATION;  Surgeon: Kerrin Elspeth BROCKS, MD;  Location: [facility];  Service: Thoracic;  Laterality: Right;  DRAINAGE OF PLEURAL EFFUSION   Family History  Problem Relation Age of Onset   Cancer Father        prostate   Social History   Socioeconomic History   Marital status: Married    Spouse name: [name]   Number of children: 3   Years of  education: College   Highest education level: Not on file  Occupational History    Employer: UNEMPLOYED  Tobacco Use   Smoking status: Former    Current packs/day: 0.00    Average packs/day: 1 pack/day for 5.0 years (5.0 ttl pk-yrs)    Types: Cigarettes, Cigars    Start date: 05/16/1963    Quit date: 05/15/1968    Years since quitting: 55.5    Passive exposure: Never   Smokeless tobacco: Never  Vaping Use   Vaping status: Never Used  Substance and Sexual Activity   Alcohol use: No    Alcohol/week: 0.0 standard drinks of alcohol   Drug use: Never   Sexual activity: Yes  Other Topics Concern   Not on file  Social History Narrative   Patient lives at home with family.   Caffeine Use: occasionally   Social Drivers of Corporate investment banker Strain: Low Risk  (11/28/2023)   Overall Financial Resource Strain (CARDIA)    Difficulty of Paying Living Expenses: Not hard at all  Food Insecurity: No Food Insecurity (11/28/2023)   Hunger Vital Sign    Worried About Running Out of Food in the Last Year: Never true    Ran Out of Food in the Last Year: Never true  Transportation Needs: No Transportation Needs (11/28/2023)   PRAPARE - Administrator, Civil Service (Medical): No    Lack of Transportation (Non-Medical): No  Physical Activity: Sufficiently Active (11/28/2023)   Exercise Vital Sign    Days of Exercise per Week: 7 days    Minutes of Exercise per Session: 30 min  Stress: No Stress Concern Present (11/28/2023)   [employer] of Occupational Health - Occupational Stress Questionnaire    Feeling of Stress: Not at all  Social Connections: Moderately Integrated (11/28/2023)   Social Connection and Isolation Panel    Frequency of Communication with Friends and Family: More than three times a week    Frequency of Social Gatherings with Friends and Family: More than three times a week    Attends Religious Services: More than 4 times per year    Active Member of [employer]  or Organizations: No    Attends Banker Meetings: Never  Marital Status: Married  Recent Concern: Social Connections - Moderately Isolated (10/08/2023)   Social Connection and Isolation Panel    Frequency of Communication with Friends and Family: More than three times a week    Frequency of Social Gatherings with Friends and Family: Once a week    Attends Religious Services: Never    Database administrator or Organizations: No    Attends Engineer, structural: Never    Marital Status: Married    Tobacco Counseling Counseling given: Yes    Clinical Intake:  Pre-visit preparation completed: Yes  Pain : No/denies pain Pain Score: 5  Pain Location: Knee Pain Orientation: Right Pain Descriptors / Indicators: Constant, Sharp, Dull, Nagging Pain Onset: More than a month ago Pain Frequency: Constant     BMI - recorded: 29.38 Nutritional Risks: None Diabetes: Yes CBG done?: No (telehealth visit. unable to obtain cbg. CBG provided by patient.) Did pt. bring in CBG monitor from home?: No (no. telehealth visit. unable to obtain.)  Lab Results  Component Value Date   HGBA1C 6.5 11/12/2023   HGBA1C 8.3 05/07/2023   HGBA1C 8.1 (A) 06/27/2022     How often do you need to have someone help you when you read instructions, pamphlets, or other written materials from your doctor or pharmacy?: 1 - Never  Interpreter Needed?: No  Information entered by :: [facility]   Activities of Daily Living     11/28/2023   11:17 AM 10/08/2023    6:00 PM  In your present state of health, do you have any difficulty performing the following activities:  Hearing? 1 1  Comment wears hearing aids bilaterally   Vision? 0 1  Difficulty concentrating or making decisions? 0 0  Walking or climbing stairs? 0   Dressing or bathing? 0   Doing errands, shopping? 0 0  Preparing Food and eating ? N   Using the Toilet? N   In the past six months, have you accidently leaked  urine? N   Do you have problems with loss of bowel control? N   Managing your Medications? N   Managing your Finances? N   Housekeeping or managing your Housekeeping? N     Patient Care Team: [name], MD as PCP - General (Internal Medicine) Pietro Redell RAMAN, MD as Consulting Physician (Cardiology) Mealor, Eulas BRAVO, MD as Consulting Physician (Cardiology) Faythe Purchase, MD as Consulting Physician (Endocrinology) Verneda Rollo Side, PA-C as Consulting Physician (Otolaryngology) Cheryl Waddell HERO, PA-C as Physician Assistant (Rheumatology) Dolphus Reiter, MD as Consulting Physician (Rheumatology) Vernetta Lonni GRADE, MD as Consulting Physician (Orthopedic Surgery) Mavis Anes, MD as Consulting Physician (General Surgery) Ahmed, Deatrice FALCON, MD as Consulting Physician (Gastroenterology)  I have updated your Care Teams any recent Medical Services you Gillum have received from other providers in the past year.     Assessment:   This is a routine wellness examination for Henrique.  Hearing/Vision screen Hearing Screening - Comments:: Difficulty hearing. Has hearing aids bilateral  Vision Screening - Comments:: Wears rx glasses - up to date with routine eye exams with  [facility]    Goals Addressed             This Visit's Progress    Patient Stated       I just want to stay out of the hospital        Depression Screen     11/28/2023   11:19 AM 10/25/2023   11:21 AM 10/18/2023  11:22 AM 09/28/2023    1:43 PM 09/27/2023    1:43 PM 07/18/2023    1:26 PM 06/27/2022    1:48 PM  PHQ 2/9 Scores  PHQ - 2 Score 0 0 0 0 0 0 2  PHQ- 9 Score 0 5  0   11    Fall Risk     11/28/2023   11:19 AM 10/25/2023   11:21 AM 10/18/2023   11:22 AM 09/28/2023    1:43 PM 09/27/2023    1:42 PM  Fall Risk   Falls in the past year? 0 0 0 0 1  Number falls in past yr: 0 0 0 0 1  Injury with Fall? 0 0 0 0 0  Risk for fall due to : No Fall Risks No Fall Risks  No Fall Risks    Follow up Falls evaluation completed;Education provided;Falls prevention discussed Falls evaluation completed  Falls evaluation completed     MEDICARE RISK AT HOME:  Medicare Risk at Home Any stairs in or around the home?: No If so, are there any without handrails?: No Home free of loose throw rugs in walkways, pet beds, electrical cords, etc?: Yes Adequate lighting in your home to reduce risk of falls?: Yes Life alert?: No Use of a cane, walker or w/c?: Yes Grab bars in the bathroom?: Yes Shower chair or bench in shower?: Yes Elevated toilet seat or a handicapped toilet?: Yes  TIMED UP AND GO:  Was the test performed?  No  Cognitive Function: 6CIT completed        11/28/2023   11:19 AM  6CIT Screen  What Year? 0 points  What month? 0 points  What time? 0 points  Count back from 20 0 points  Months in reverse 0 points  Repeat phrase 0 points  Total Score 0 points    Immunizations Immunization History  Administered Date(s) Administered   Pneumococcal Conjugate-13 01/13/2014   Pneumococcal Polysaccharide-23 05/15/2006, 09/16/2012, 03/29/2018    Screening Tests Health Maintenance  Topic Date Due   COVID-19 Vaccine (1) Never done   DTaP/Tdap/Td (1 - Tdap) Never done   Zoster Vaccines- Shingrix (1 of 2) Never done   INFLUENZA VACCINE  12/14/2023   OPHTHALMOLOGY EXAM  02/05/2024   HEMOGLOBIN A1C  05/13/2024   FOOT EXAM  07/17/2024   Diabetic kidney evaluation - eGFR measurement  11/07/2024   Diabetic kidney evaluation - Urine ACR  11/11/2024   Medicare Annual Wellness (AWV)  11/27/2024   Pneumococcal Vaccine: 50+ Years  Completed   Hepatitis C Screening  Completed   Hepatitis B Vaccines  Aged Out   HPV VACCINES  Aged Out   Meningococcal B Vaccine  Aged Out   Colonoscopy  Discontinued    Health Maintenance  Health Maintenance Due  Topic Date Due   COVID-19 Vaccine (1) Never done   DTaP/Tdap/Td (1 - Tdap) Never done   Zoster Vaccines- Shingrix (1 of 2)  Never done   Health Maintenance Items Addressed: Patient advised of recommended vaccines and where to obtain those vaccines with verbal understanding Additional Screening:  Vision Screening: Recommended annual ophthalmology exams for early detection of glaucoma and other disorders of the eye. Would you like a referral to an eye doctor? No    Dental Screening: Recommended annual dental exams for proper oral hygiene  Community Resource Referral / Chronic Care Management: CRR required this visit?  No   CCM required this visit?  No   Plan:    I have  personally reviewed and noted the following in the patient's chart:   Medical and social history Use of alcohol, tobacco or illicit drugs  Current medications and supplements including opioid prescriptions. Patient is currently taking opioid prescriptions. Information provided to patient regarding non-opioid alternatives. Patient advised to discuss non-opioid treatment plan with their provider. Functional ability and status Nutritional status Physical activity Advanced directives List of other physicians Hospitalizations, surgeries, and ER visits in previous 12 months Vitals Screenings to include cognitive, depression, and falls Referrals and appointments  In addition, I have reviewed and discussed with patient certain preventive protocols, quality metrics, and best practice recommendations. A written personalized care plan for preventive services as well as general preventive health recommendations were provided to patient.   Dain Laseter, CMA   11/28/2023   After Visit Summary: (Mail) Due to this being a telephonic visit, the after visit summary with patients personalized plan was offered to patient via mail   Notes: Nothing significant to report at this time.

## 2023-11-29 NOTE — Telephone Encounter (Signed)
 Contacted patient to advise he could restart the Orencia , Advised patient I would contact him to advise when he could get the orencia  injection

## 2023-11-30 NOTE — Telephone Encounter (Signed)
 Contacted patient to advise that he should call the infusion center to get his injection scheduled.

## 2023-12-03 ENCOUNTER — Ambulatory Visit: Admitting: Physician Assistant

## 2023-12-03 DIAGNOSIS — M1711 Unilateral primary osteoarthritis, right knee: Secondary | ICD-10-CM

## 2023-12-03 DIAGNOSIS — Z96651 Presence of right artificial knee joint: Secondary | ICD-10-CM | POA: Diagnosis not present

## 2023-12-03 NOTE — Progress Notes (Signed)
   Procedure Note  Patient: Opie Maclaughlin Frederic Sr.             Date of Birth: 05/14/1946           MRN: [id number]             Visit Date: 12/03/2023 HPI: Mr. Carattini returns today requesting aspiration of his right knee.  He was last seen by Dr. Vernetta on 11/07/2023 and at that time he was given a steroid injection in the knee and also underwent aspiration.  He has no new injury.  Physical exam: Right knee: Slight effusion no abnormal warmth erythema.  Overall good range of motion of the knee.  Procedures: Right knee is prepped with Betadine and the knee is aspirated from the superior lateral approach patient tolerates well.  Total of 20 cc of serosanguineous fluid obtained.  Visit Diagnoses:  1. Status post total right knee replacement     Plan: He knows to wait at least 3 months between steroid injections in the knee.  He will follow-up with us  as needed.  Questions were encouraged and answered.

## 2023-12-04 ENCOUNTER — Ambulatory Visit (INDEPENDENT_AMBULATORY_CARE_PROVIDER_SITE_OTHER): Admitting: Gastroenterology

## 2023-12-10 ENCOUNTER — Other Ambulatory Visit: Payer: Self-pay | Admitting: Pharmacist

## 2023-12-10 DIAGNOSIS — M0579 Rheumatoid arthritis with rheumatoid factor of multiple sites without organ or systems involvement: Secondary | ICD-10-CM

## 2023-12-10 DIAGNOSIS — Z79899 Other long term (current) drug therapy: Secondary | ICD-10-CM

## 2023-12-10 NOTE — Progress Notes (Signed)
 Patient restarting Orencia  IV ([id number]) on 12/12/2023. Discussed with Dr. Dolphus - we will not reload infusion due to history of infections. He has been cleared to resume infusions Diagnosis:  Dose: 750mg  every 4 weeks (based on last recorded weight 82.6kg)  Last Clinic Visit: 11/19/2023 Next Clinic Visit: 01/24/2024  Last infusion: 07/04/2023  Labs: CBC and CMP on 11/08/2023 TB Gold: negative on 11/19/2023   Orders placed for Orencia  IV ([id number]) x 3 doses along with premedication of acetaminophen  650mg  p.o. and diphenhydramine  25mg  p.o. to be administered 30 minutes before medication infusion.  Standing CBC with diff/platelet and CMP with GFR orders placed to be drawn every 2 months.  Next TB gold due July 2026  Sherry Pennant, PharmD, MPH, BCPS, CPP Clinical Pharmacist (Rheumatology and Pulmonology)

## 2023-12-12 ENCOUNTER — Ambulatory Visit: Payer: Self-pay | Admitting: Physician Assistant

## 2023-12-12 ENCOUNTER — Ambulatory Visit (HOSPITAL_COMMUNITY)
Admission: RE | Admit: 2023-12-12 | Discharge: 2023-12-12 | Disposition: A | Discharge status: Home / Self Care | Source: Ambulatory Visit | Attending: Rheumatology | Admitting: Rheumatology

## 2023-12-12 DIAGNOSIS — M0579 Rheumatoid arthritis with rheumatoid factor of multiple sites without organ or systems involvement: Secondary | ICD-10-CM | POA: Insufficient documentation

## 2023-12-12 DIAGNOSIS — Z79899 Other long term (current) drug therapy: Secondary | ICD-10-CM | POA: Diagnosis present

## 2023-12-12 LAB — CBC WITH DIFFERENTIAL/PLATELET
Abs Immature Granulocytes: 0.02 K/uL (ref 0.00–0.07)
Basophils Absolute: 0.1 K/uL (ref 0.0–0.1)
Basophils Relative: 1 %
Eosinophils Absolute: 0.2 K/uL (ref 0.0–0.5)
Eosinophils Relative: 3 %
HCT: 38.5 % — ABNORMAL LOW (ref 39.0–52.0)
Hemoglobin: 12.6 g/dL — ABNORMAL LOW (ref 13.0–17.0)
Immature Granulocytes: 0 %
Lymphocytes Relative: 44 %
Lymphs Abs: 2.7 K/uL (ref 0.7–4.0)
MCH: 28.7 pg (ref 26.0–34.0)
MCHC: 32.7 g/dL (ref 30.0–36.0)
MCV: 87.7 fL (ref 80.0–100.0)
Monocytes Absolute: 0.6 K/uL (ref 0.1–1.0)
Monocytes Relative: 10 %
Neutro Abs: 2.6 K/uL (ref 1.7–7.7)
Neutrophils Relative %: 42 %
Platelets: 261 K/uL (ref 150–400)
RBC: 4.39 MIL/uL (ref 4.22–5.81)
RDW: 14.9 % (ref 11.5–15.5)
WBC: 6.1 K/uL (ref 4.0–10.5)
nRBC: 0 % (ref 0.0–0.2)

## 2023-12-12 MED ORDER — DIPHENHYDRAMINE HCL 25 MG PO CAPS: 25.0000 mg | ORAL_CAPSULE | ORAL | Status: DC

## 2023-12-12 MED ORDER — ACETAMINOPHEN 325 MG PO TABS: 650.0000 mg | ORAL_TABLET | ORAL | Status: DC

## 2023-12-12 MED ORDER — SODIUM CHLORIDE 0.9 % IV SOLN
750.0000 mg | INTRAVENOUS | Status: DC
  Administered 2023-12-12: 750 mg via INTRAVENOUS
  Filled 2023-12-12: qty 30

## 2023-12-12 NOTE — Progress Notes (Signed)
 Hemoglobin and hematocrit remain low but have improved-continue to trend up. Rest of CBC WNL.

## 2023-12-14 ENCOUNTER — Telehealth: Payer: Self-pay | Admitting: Cardiology

## 2023-12-14 NOTE — Telephone Encounter (Signed)
 STAT if HR is under 50 or over 120  (normal HR is 60-100 beats per minute)  What is your heart rate?  50-56. PT is concerned and would like to establish parameters.  Do you have a log of your heart rate readings (document readings)?    Do you have any other symptoms?  Chronic knee pain, SOB

## 2023-12-14 NOTE — Telephone Encounter (Signed)
 Attempted phone call and left voicemail message to contact office at [phone].

## 2023-12-17 ENCOUNTER — Telehealth: Payer: Self-pay | Admitting: Cardiology

## 2023-12-17 NOTE — Telephone Encounter (Signed)
 Calling in with concerns for patient HR. States that after a walk the patient HR drops to 48-52HR and short period of walking. Calling to see what is lower limit for the patient to be able to continue with his activity. Please advise

## 2023-12-17 NOTE — Telephone Encounter (Signed)
 Left message for pt to call.

## 2023-12-17 NOTE — Telephone Encounter (Signed)
 Spoke with patient's PT, Elenor, who states that the patient's HR has been dropping with exercise. She states that it has been going down to 50-52 while walking and today it got down to 48. She states that there was a nurse there today who listened to his heart and got the same readings. She states that his resting heart rate is usually around 58-60. He is not having any dizziness or lightheadedness with activity. He does get tired and short of breath after a little bit of exercise. No symptoms at rest. Patient has a history of bradycardia. Advised as long as he is not symptomatic with low HR's he can continue PT.

## 2023-12-25 ENCOUNTER — Ambulatory Visit

## 2023-12-25 ENCOUNTER — Ambulatory Visit: Attending: Nurse Practitioner | Admitting: Nurse Practitioner

## 2023-12-25 ENCOUNTER — Encounter: Payer: Self-pay | Admitting: Nurse Practitioner

## 2023-12-25 VITALS — BP 150/70 | HR 51 | Ht 66.0 in | Wt 195.0 lb

## 2023-12-25 DIAGNOSIS — E785 Hyperlipidemia, unspecified: Secondary | ICD-10-CM

## 2023-12-25 DIAGNOSIS — I480 Paroxysmal atrial fibrillation: Secondary | ICD-10-CM | POA: Diagnosis not present

## 2023-12-25 DIAGNOSIS — R001 Bradycardia, unspecified: Secondary | ICD-10-CM | POA: Diagnosis not present

## 2023-12-25 DIAGNOSIS — I10 Essential (primary) hypertension: Secondary | ICD-10-CM

## 2023-12-25 DIAGNOSIS — E1169 Type 2 diabetes mellitus with other specified complication: Secondary | ICD-10-CM

## 2023-12-25 DIAGNOSIS — I2510 Atherosclerotic heart disease of native coronary artery without angina pectoris: Secondary | ICD-10-CM | POA: Diagnosis not present

## 2023-12-25 NOTE — Patient Instructions (Addendum)
 Medication Instructions:  Your physician recommends that you continue on your current medications as directed. Please refer to the Current Medication list given to you today.  *If you need a refill on your cardiac medications before your next appointment, please call your pharmacy*  Lab Work: NONE ordered at this time of appointment   Testing/Procedures: ZIO XT- Long Term Monitor Instructions  Your physician has requested you wear a ZIO patch monitor for 7 days.  This is a single patch monitor. [employer] supplies one patch monitor per enrollment. Additional stickers are not available. Please do not apply patch if you will be having a Nuclear Stress Test,  Echocardiogram, Cardiac CT, MRI, or Chest Xray during the period you would be wearing the  monitor. The patch cannot be worn during these tests. You cannot remove and re-apply the  ZIO XT patch monitor.  Your ZIO patch monitor will be mailed 3 day USPS to your address on file. It Sitzmann take 3-5 days  to receive your monitor after you have been enrolled.  Once you have received your monitor, please review the enclosed instructions. Your monitor  has already been registered assigning a specific monitor serial # to you.  Billing and Patient Assistance Program Information  We have supplied [employer] with any of your insurance information on file for billing purposes. [employer] offers a sliding scale Patient Assistance Program for patients that do not have  insurance, or whose insurance does not completely cover the cost of the ZIO monitor.  You must apply for the Patient Assistance Program to qualify for this discounted rate.  To apply, please call [employer] at [phone], select option 4, select option 2, ask to apply for  Patient Assistance Program. Meredeth will ask your household income, and how many people  are in your household. They will quote your out-of-pocket cost based on that information.  [employer] will also be able to set up a  63-month, interest-free payment plan if needed.  Applying the monitor   Shave hair from upper left chest.  Hold abrader disc by orange tab. Rub abrader in 40 strokes over the upper left chest as  indicated in your monitor instructions.  Clean area with 4 enclosed alcohol pads. Let dry.  Apply patch as indicated in monitor instructions. Patch will be placed under collarbone on left  side of chest with arrow pointing upward.  Rub patch adhesive wings for 2 minutes. Remove white label marked 1. Remove the white  label marked 2. Rub patch adhesive wings for 2 additional minutes.  While looking in a mirror, press and release button in center of patch. A small green light will  flash 3-4 times. This will be your only indicator that the monitor has been turned on.  Do not shower for the first 24 hours. You Shaheed shower after the first 24 hours.  Press the button if you feel a symptom. You will hear a small click. Record Date, Time and  Symptom in the Patient Logbook.  When you are ready to remove the patch, follow instructions on the last 2 pages of Patient  Logbook. Stick patch monitor onto the last page of Patient Logbook.  Place Patient Logbook in the blue and white box. Use locking tab on box and tape box closed  securely. The blue and white box has prepaid postage on it. Please place it in the mailbox as  soon as possible. Your physician should have your test results approximately 7 days after the  monitor has been  mailed back to [facility].  Call [facility] Customer Care at [phone] if you have questions regarding  your ZIO XT patch monitor. Call them immediately if you see an orange light blinking on your  monitor.  If your monitor falls off in less than 4 days, contact our Monitor department at [phone].  If your monitor becomes loose or falls off after 4 days call [employer] at [phone] for  suggestions on securing your monitor   Follow-Up: At [facility], you and your health needs are our priority.  As part of our continuing mission to provide you with exceptional heart care, our providers are all part of one team.  This team includes your primary Cardiologist (physician) and Advanced Practice Providers or APPs (Physician Assistants and Nurse Practitioners) who all work together to provide you with the care you need, when you need it.  Your next appointment:   Dr. Nancey October 2025 Dr. Pietro 6 months.   Provider:   Redell Pietro, MD    We recommend signing up for the patient portal called MyChart.  Sign up information is provided on this After Visit Summary.  MyChart is used to connect with patients for Virtual Visits (Telemedicine).  Patients are able to view lab/test results, encounter notes, upcoming appointments, etc.  Non-urgent messages can be sent to your provider as well.   To learn more about what you can do with MyChart, go to [email or web address].   Other Instructions Monitor blood pressure. Report BP consistently greater than 140/80.

## 2023-12-25 NOTE — Progress Notes (Unsigned)
Enrolled patient for a 7 day Zio XT monitor to be mailed to patients home   [place] to read 

## 2023-12-25 NOTE — Progress Notes (Signed)
 Office Visit    Patient Name: Joel Minjares Abello Sr. Date of Encounter: 12/25/2023  Primary Care Provider:  [name], MD Primary Cardiologist:  Redell Shallow, MD  Chief Complaint    78 year old male with a history of mild nonobstructive CAD, paroxysmal atrial fibrillation, bradycardia, moderate to severe tricuspid valve regurgitation, hypertension, hyperlipidemia, type 2 diabetes, rheumatoid arthritis, and GERD who presents for follow-up related to atrial fibrillation.  Past Medical History    Past Medical History:  Diagnosis Date   A-fib (HCC)    AKI (acute kidney injury) (HCC) 05/18/2021   Allergy    Anemia    Arthritis    all over   Bradycardia    Chronic bronchitis (HCC)    get it just about q yr (03/17/2014)   Complication of anesthesia    difficulty waking up   Daily headache    here lately (03/17/2014); relates to sinuses   GERD (gastroesophageal reflux disease)    Hard of hearing    hearing aides bilat   Hiatal hernia    History of blood transfusion 2008   related to OR   History of kidney stones    Hyperlipidemia    Hypertension    Pneumonia 1972 X 1   Rheumatoid arthritis (HCC)    Type II diabetes mellitus (HCC)    Wears glasses    Past Surgical History:  Procedure Laterality Date   ATRIAL FIBRILLATION ABLATION N/A 05/23/2023   Procedure: ATRIAL FIBRILLATION ABLATION;  Surgeon: Nancey Eulas BRAVO, MD;  Location: [facility];  Service: Cardiovascular;  Laterality: N/A;   broken finger      surgical repaired left hand 2nd finger    CARDIOVERSION N/A 11/08/2022   Procedure: CARDIOVERSION;  Surgeon: Alvan Ronal BRAVO, MD;  Location: [facility];  Service: Cardiovascular;  Laterality: N/A;   CHOLECYSTECTOMY  09/05/2023   cyst removed      per left knee/posteriorly   CYSTECTOMY Left 2009   behind knee   INGUINAL HERNIA REPAIR Right ?2010   IR CV LINE INJECTION  10/02/2023   IR CV LINE INJECTION  10/10/2023   IR GUIDED DRAIN W  CATHETER PLACEMENT  10/11/2023   IR RADIOLOGIST EVAL & MGMT  10/19/2023   IR THORACENTESIS ASP PLEURAL SPACE W/IMG GUIDE  10/03/2023   JOINT REPLACEMENT     KNEE ARTHROSCOPY Bilateral 1980's   LIVER BIOPSY N/A 09/05/2023   Procedure: BIOPSY, LIVER;  Surgeon: Mavis Anes, MD;  Location: [facility];  Service: General;  Laterality: N/A;   PARTIAL KNEE ARTHROPLASTY Right 08/27/2015   Procedure: RIGHT UNICOMPARTMENTAL KNEE ARTHROPLASTY;  Surgeon: Lonni CINDERELLA Poli, MD;  Location: [facility];  Service: Orthopedics;  Laterality: Right;   REVISION TOTAL KNEE ARTHROPLASTY Left 2008   SHOULDER SURGERY Left 2019   TOTAL KNEE ARTHROPLASTY Left 2003   VIDEO ASSISTED THORACOSCOPY (VATS)/DECORTICATION Right 10/14/2023   Procedure: VIDEO ASSISTED THORACOSCOPY (VATS)/DECORTICATION;  Surgeon: Kerrin Elspeth BROCKS, MD;  Location: [facility] OR;  Service: Thoracic;  Laterality: Right;  DRAINAGE OF PLEURAL EFFUSION    Allergies  Allergies  Allergen Reactions   Codeine Swelling   Lipitor [Atorvastatin] Other (See Comments)    Muscle aches    Invokana [Canagliflozin] Other (See Comments)    Stomach upset    Losartan Nausea And Vomiting   Roxicodone  [Oxycodone ] Itching   Vicodin [Hydrocodone -Acetaminophen ] Itching     Labs/Other Studies Reviewed    The following studies were reviewed today:  Cardiac Studies & Procedures   ______________________________________________________________________________________________  STRESS TESTS  MYOCARDIAL PERFUSION IMAGING 03/20/2023  Interpretation Summary   Findings are consistent with no ischemia. The study is low risk.   No ST deviation was noted.   LV perfusion is abnormal. Defect 1: There is a small defect with mild reduction in uptake present in the apical inferior location(s) that is fixed. Viability is present. There is normal wall motion in the defect area. Consistent with artifact.   Left ventricular function is abnormal. Nuclear stress EF: 52%. The left  ventricular ejection fraction is mildly decreased (45-54%). End diastolic cavity size is normal. End systolic cavity size is normal.   Prior study not available for comparison.  Small apical perfusion defect with normal function most consistent with artifact. Low normal LVEF that appears visualize normal; sensitivity of LVEF assessment is decreased in the setting of atrial fibrillation imaging. Low risk study.   ECHOCARDIOGRAM  ECHOCARDIOGRAM COMPLETE 11/15/2023  Narrative ECHOCARDIOGRAM REPORT    Patient Name:   Joel Kitchings Schuur Sr. Date of Exam: 11/15/2023 Medical Rec #:  [id number]        Height:       66.0 in Accession #:    [id number]       Weight:       187.0 lb Date of Birth:  1946-05-07         BSA:          1.943 m Patient Age:    77 years         BP:           126/70 mmHg Patient Gender: M                HR:           49 bpm. Exam Location:  [address]  Procedure: 2D Echo, 3D Echo, Cardiac Doppler and Color Doppler (Both Spectral and Color Flow Doppler were utilized during procedure).  Indications:    I07.9 Tricuspid Valve Disorder  History:        Patient has prior history of Echocardiogram examinations, most recent 11/27/2022. Abnormal ECG, Arrythmias:Bradycardia, Atrial Fibrillation and Status post Ablation; Risk Factors:Former Smoker, Dyslipidemia, Diabetes and Hypertension. Status Post Cholecystectomy, Complicated by Pleural Effusion, GERD.  Sonographer:    Heather Hawks RDCS Referring Phys: REDELL RAMAN CRENSHAW  IMPRESSIONS   1. Left ventricular ejection fraction, by estimation, is >75%. Left ventricular ejection fraction by 3D volume is 80 %. The left ventricle has hyperdynamic function. The left ventricle has no regional wall motion abnormalities. Left ventricular diastolic parameters are consistent with Grade II diastolic dysfunction (pseudonormalization). 2. Right ventricular systolic function is hyperdynamic. The right ventricular size is normal. There is  moderately elevated pulmonary artery systolic pressure. The estimated right ventricular systolic pressure is 47.4 mmHg. 3. Left atrial size was severely dilated. 4. Right atrial size was moderately dilated. 5. The mitral valve is grossly normal. Trivial mitral valve regurgitation. 6. Tricuspid valve regurgitation is moderate. 7. The aortic valve is tricuspid. Aortic valve regurgitation is mild. Aortic valve sclerosis is present, with no evidence of aortic valve stenosis. 8. The inferior vena cava is normal in size with <50% respiratory variability, suggesting right atrial pressure of 8 mmHg.  Comparison(s): Changes from prior study are noted. 11/27/2022: LVEF 60-65%, moderate to severe TR.  FINDINGS Left Ventricle: Left ventricular ejection fraction, by estimation, is >75%. Left ventricular ejection fraction by 3D volume is 80 %. The left ventricle has hyperdynamic function. The left ventricle has no regional wall motion abnormalities. The left  ventricular internal cavity size was normal in size. There is no left ventricular hypertrophy. Left ventricular diastolic parameters are consistent with Grade II diastolic dysfunction (pseudonormalization). Indeterminate filling pressures.  Right Ventricle: The right ventricular size is normal. No increase in right ventricular wall thickness. Right ventricular systolic function is hyperdynamic. There is moderately elevated pulmonary artery systolic pressure. The tricuspid regurgitant velocity is 3.14 m/s, and with an assumed right atrial pressure of 8 mmHg, the estimated right ventricular systolic pressure is 47.4 mmHg.  Left Atrium: Left atrial size was severely dilated.  Right Atrium: Right atrial size was moderately dilated.  Pericardium: There is no evidence of pericardial effusion.  Mitral Valve: The mitral valve is grossly normal. Trivial mitral valve regurgitation.  Tricuspid Valve: The tricuspid valve is grossly normal. Tricuspid valve  regurgitation is moderate.  Aortic Valve: The aortic valve is tricuspid. Aortic valve regurgitation is mild. Aortic valve sclerosis is present, with no evidence of aortic valve stenosis.  Pulmonic Valve: The pulmonic valve was grossly normal. Pulmonic valve regurgitation is trivial.  Aorta: The aortic root and ascending aorta are structurally normal, with no evidence of dilitation.  Venous: The inferior vena cava is normal in size with less than 50% respiratory variability, suggesting right atrial pressure of 8 mmHg.  IAS/Shunts: No atrial level shunt detected by color flow Doppler.  Additional Comments: 3D was performed not requiring image post processing on an independent workstation and was normal.   LEFT VENTRICLE PLAX 2D LVIDd:         5.50 cm         Diastology LVIDs:         3.00 cm         LV e' medial:    9.90 cm/s LV PW:         1.00 cm         LV E/e' medial:  9.7 LV IVS:        0.60 cm         LV e' lateral:   13.50 cm/s LVOT diam:     2.40 cm         LV E/e' lateral: 7.1 LV SV:         101 LV SV Index:   52 LVOT Area:     4.52 cm        3D Volume EF LV 3D EF:    Left ventricul ar ejection fraction by 3D volume is 80 %.  3D Volume EF: 3D EF:        80 % LV EDV:       203 ml LV ESV:       41 ml LV SV:        162 ml  RIGHT VENTRICLE RV Basal diam:  4.80 cm RV Mid diam:    3.50 cm RV S prime:     21.40 cm/s TAPSE (M-mode): 2.8 cm RVSP:           47.4 mmHg  LEFT ATRIUM              Index        RIGHT ATRIUM           Index LA diam:        5.10 cm  2.62 cm/m   RA Pressure: 8.00 mmHg LA Vol (A2C):   113.0 ml 58.14 ml/m  RA Area:     25.30 cm LA Vol (A4C):   107.0 ml 55.06 ml/m  RA Volume:  92.30 ml  47.49 ml/m LA Biplane Vol: 112.0 ml 57.63 ml/m AORTIC VALVE LVOT Vmax:   90.90 cm/s LVOT Vmean:  58.450 cm/s LVOT VTI:    0.222 m  AORTA Ao Root diam: 3.60 cm Ao Asc diam:  3.60 cm  MITRAL VALVE               TRICUSPID VALVE MV Area (PHT): cm          TR Peak grad:   39.4 mmHg MV Decel Time: 225 msec    TR Vmax:        314.00 cm/s MV E velocity: 95.65 cm/s  Estimated RAP:  8.00 mmHg MV A velocity: 47.15 cm/s  RVSP:           47.4 mmHg MV E/A ratio:  2.03 SHUNTS Systemic VTI:  0.22 m Systemic Diam: 2.40 cm  Vinie Maxcy MD Electronically signed by Vinie Maxcy MD Signature Date/Time: 11/15/2023/2:37:24 PM    Final          ______________________________________________________________________________________________     Recent Labs: 10/17/2023: Magnesium 1.8 11/08/2023: ALT 16; BUN 26; Creatinine, Ser 1.03; Potassium 4.8; Sodium 136 12/12/2023: Hemoglobin 12.6; Platelets 261  Recent Lipid Panel    Component Value Date/Time   CHOL 120 07/03/2023 0809   CHOL 120 05/20/2019 1102   TRIG 103 07/03/2023 0809   HDL 34 (L) 07/03/2023 0809   HDL 41 05/20/2019 1102   CHOLHDL 3.5 07/03/2023 0809   VLDL 21 07/03/2023 0809   LDLCALC 65 07/03/2023 0809   LDLCALC 69 05/20/2019 1102    History of Present Illness    78 year old male with the above past medical history including mild nonobstructive CAD, paroxysmal atrial fibrillation, bradycardia, moderate to severe tricuspid valve regurgitation, hypertension, hyperlipidemia, type 2 diabetes, rheumatoid arthritis, and GERD.  Cardiac catheterization 2004 revealed mild nonobstructive coronary artery disease.  Abdominal ultrasound at the time showed no aneurysm.  Carotid Dopplers in 2017 showed minimal plaque bilaterally. Echocardiogram in July 2024 showed normal LV function, mild LVH, mild biatrial enlargement, moderate to severe tricuspid valve regurgitation.  Nuclear study in November 2024 showed EF 52%, apical thinning, no evidence of ischemia.  CTA prior to atrial fibrillation ablation in December 2024 showed calcium score 327 (54th percentile).  He underwent atrial fibrillation ablation in January 2025.  He was last seen in the office on 08/28/2023 and was stable from a cardiac  standpoint.  He was maintaining sinus rhythm.  He underwent elective cholecystectomy in 08/2023.  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 He underwent thoracentesis x 2. Repeat echocardiogram in 11/2023 showed EF greater than 75%, hyperdynamic LV function, no RWMA, G2 DD, hyperdynamic RV systolic function, moderately elevated PASP, severely dilated left atrium, moderately dilated right atrium, moderate tricuspid valve regurgitation.  He contacted our office on 12/17/2023 with concern for generalized fatigue, dyspnea on exertion, bradycardia.  He presents today for follow-up accompanied by his wife and daughter.   Since his last visit he has been stable from a cardiac standpoint. His home health nurse noted his heart rate in the mid 40s.  He was advised to follow-up with cardiology.  He has stable mild dyspnea on exertion, he denies any chest pain, palpitations, dizziness, presyncope, syncope.    Home Medications    Current Outpatient Medications  Medication Sig Dispense Refill   Abatacept  (ORENCIA  IV) Inject 750 mg into the vein every 28 (twenty-eight) days.     Accu-Chek FastClix Lancets  MISC use to check blood sugar fingerstick Six times a day IC-10 CODE: E11.65; Duration: 90 days     albuterol  (PROVENTIL ) (2.5 MG/3ML) 0.083% nebulizer solution Take 3 mLs (2.5 mg total) by nebulization every 6 (six) hours as needed for wheezing or shortness of breath. 360 mL 1   albuterol  (VENTOLIN  HFA) 108 (90 Base) MCG/ACT inhaler Inhale 2 puffs into the lungs every 6 (six) hours as needed for wheezing or shortness of breath. 18 g 1   amLODipine  (NORVASC ) 5 MG tablet Take 1 tablet (5 mg total) by mouth daily. 90 tablet 3   apixaban  (ELIQUIS ) 5 MG TABS tablet Take 1 tablet (5 mg total) by mouth 2 (two) times daily. 60 tablet 6   ascorbic acid  (VITAMIN C ) 500 MG tablet Take 500 mg by mouth in the morning.     calcium carbonate  (OSCAL) 1500 (600 Ca) MG TABS tablet Take 600 mg of elemental calcium by mouth in the morning.     calcium carbonate (TUMS - DOSED IN MG ELEMENTAL CALCIUM) 500 MG chewable tablet Chew 1 tablet by mouth as needed for indigestion or heartburn.     cholecalciferol (VITAMIN D3) 25 MCG (1000 UNIT) tablet Take 1,000 Units by mouth in the morning.     Glucose Blood (ACCU-CHEK GUIDE VI) check blood sugar In Vitro Six times a day IC-10 CODE: E11.65; Duration: 100 days     Insulin  Aspart, w/Niacinamide, (FIASP ) 100 UNIT/ML SOLN 77 Units continuous. Insulin  pump     insulin  glargine (LANTUS ) 100 UNIT/ML injection Inject 16 Units into the skin as needed (If pump goes out).     KRILL OIL PO Take 400 mg by mouth in the morning.     loratadine  (CLARITIN ) 10 MG tablet Take 10 mg by mouth in the morning.     LYUMJEV  100 UNIT/ML SOLN 77 units/a day Injection in [employer] insulin  pump ICD-10 diagnosis code E11.65     Multiple Vitamin (MULTIVITAMIN WITH MINERALS) TABS Take 1 tablet by mouth in the morning. Centrum     ondansetron  (ZOFRAN -ODT) 4 MG disintegrating tablet Take 1 tablet (4 mg total) by mouth every 8 (eight) hours as needed for nausea or vomiting. 12 tablet 0   ONE TOUCH ULTRA TEST test strip 1 each daily. Pt says 2-3 times daily  3   pantoprazole  (PROTONIX ) 40 MG tablet TAKE 1 TABLET BY MOUTH DAILY 90 tablet 3   Potassium 99 MG TABS Take 99 mg by mouth in the morning.     pravastatin  (PRAVACHOL ) 40 MG tablet Take 40 mg by mouth in the morning.     pyridOXINE (VITAMIN B6) 50 MG tablet Take 50 mg by mouth in the morning.     ramipril  (ALTACE ) 5 MG capsule Take 1 capsule (5 mg total) by mouth daily. 90 capsule 3   traMADol  (ULTRAM ) 50 MG tablet Take 1-2 tablets (50-100 mg total) by mouth 3 (three) times daily as needed for moderate pain (pain score 4-6). 30 tablet 0   vitamin E  400 UNIT capsule Take 400 Units by mouth in the morning.     zinc gluconate 50 MG tablet Take 50 mg by mouth in the morning.      No current facility-administered medications for this visit.     Review of Systems   He denies chest pain, palpitations, dyspnea, pnd, orthopnea, n, v, dizziness, syncope, edema, weight gain, or early satiety. All other systems reviewed and are otherwise negative except as noted above.   Physical Exam  VS:  BP (!) 150/70   Pulse (!) 51   Ht 5' 6 (1.676 m)   Wt 195 lb (88.5 kg)   SpO2 96%   BMI 31.47 kg/m  GEN: Well nourished, well developed, in no acute distress. HEENT: normal. Neck: Supple, no JVD, carotid bruits, or masses. Cardiac: RRR, no murmurs, rubs, or gallops. No clubbing, cyanosis, edema.  Radials/DP/PT 2+ and equal bilaterally.  Respiratory:  Respirations regular and unlabored, clear to auscultation bilaterally. GI: Soft, nontender, nondistended, BS + x 4. MS: no deformity or atrophy. Skin: warm and dry, no rash. Neuro:  Strength and sensation are intact. Psych: Normal affect.  Accessory Clinical Findings    ECG personally reviewed by me today - EKG Interpretation Date/Time:  Tuesday December 25 2023 13:50:16 EDT Ventricular Rate:  51 PR Interval:  166 QRS Duration:  80 QT Interval:  446 QTC Calculation: 411 R Axis:   34  Text Interpretation: Sinus bradycardia When compared with ECG of 26-Therien-2025 19:58, Premature ventricular complexes are no longer Present Premature atrial complexes are no longer Present Confirmed by Daneen Perkins ([id number]) on 12/25/2023 2:08:45 PM  - no acute changes.   Lab Results  Component Value Date   WBC 6.1 12/12/2023   HGB 12.6 (L) 12/12/2023   HCT 38.5 (L) 12/12/2023   MCV 87.7 12/12/2023   PLT 261 12/12/2023   Lab Results  Component Value Date   CREATININE 1.03 11/08/2023   BUN 26 11/08/2023   NA 136 11/08/2023   K 4.8 11/08/2023   CL 100 11/08/2023   CO2 22 11/08/2023   Lab Results  Component Value Date   ALT 16 11/08/2023   AST 21 11/08/2023   ALKPHOS 163 (H) 11/08/2023   BILITOT 0.5 11/08/2023   Lab Results   Component Value Date   CHOL 120 07/03/2023   HDL 34 (L) 07/03/2023   LDLCALC 65 07/03/2023   TRIG 103 07/03/2023   CHOLHDL 3.5 07/03/2023    Lab Results  Component Value Date   HGBA1C 6.5 11/12/2023    Assessment & Plan   1. Paroxysmal atrial fibrillation/bradycardia: Maintaining NSR.  He home health nurse have noticed more frequent episodes of bradycardia.  He is generally asymptomatic.  Will check 7-day ZIO in the setting of bradycardia.  He is not on any AV nodal blocking agents at this time.  Reviewed ED precautions.  He is pending repeat labs (CBC, CMET) per rheumatology.  Denies bleeding.  Continue Eliquis . Followed by EP.   2. Nonobstructive CAD: Cardiac catheterization 2004 revealed mild nonobstructive coronary artery disease.  Nuclear study in November 2024 showed EF 52%, apical thinning, no evidence of ischemia. CTA prior to atrial fibrillation ablation in December 2024 showed calcium score 327 (54th percentile). Stable with no anginal symptoms. No indication for ischemic evaluation.  Continue pravastatin .  3. Tricuspid valve regurgitation: Most recent echocardiogram in 11/2023 showed EF greater than 75%, hyperdynamic LV function, no RWMA, G2 DD, hyperdynamic RV systolic function, moderately elevated PASP, severely dilated left atrium, moderately dilated right atrium, moderate tricuspid valve regurgitation, overall stable.  Euvolemic, compensated on exam.  4. Hypertension:  BP mildly elevated in office today, advised him to continue to monitor BP and report BP consistently greater than 140/80 mmHg.  If BP remains elevated above goal, consider escalation of antihypertensive therapy.  For now, continue amlodipine  at current dose.  5. Hyperlipidemia: LDL was 65 in 06/2023.  Continue pravastatin .   6. Type 2 diabetes: A1c was 6.5 in 10/2023.  Monitored  and managed per PCP.  7. Disposition:  Follow-up per recall with Dr. Nancey in 02/2024, follow-up in 6 months with Dr. Pietro.       Damien JAYSON Braver, NP 12/25/2023, 2:43 PM

## 2023-12-26 NOTE — Telephone Encounter (Signed)
 Patient has been seen.

## 2023-12-27 ENCOUNTER — Encounter: Payer: Self-pay | Admitting: Nurse Practitioner

## 2023-12-27 ENCOUNTER — Telehealth: Payer: Self-pay

## 2023-12-27 NOTE — Telephone Encounter (Signed)
 Copied from CRM #[id number]. Topic: Clinical - Home Health Verbal Orders >> Dec 27, 2023  2:12 PM Nathanel BROCKS wrote: Caller/Agency: [facility] Callback Number: [phone] Service Requested: Physical Therapy Frequency: 1 week for 4 starting week of 08/18 Any new concerns about the patient? No

## 2023-12-27 NOTE — Telephone Encounter (Signed)
 Left detailed vm approving orders.

## 2023-12-31 ENCOUNTER — Encounter: Payer: Self-pay | Admitting: Cardiology

## 2024-01-01 ENCOUNTER — Ambulatory Visit (INDEPENDENT_AMBULATORY_CARE_PROVIDER_SITE_OTHER): Admitting: Internal Medicine

## 2024-01-01 ENCOUNTER — Encounter: Payer: Self-pay | Admitting: Internal Medicine

## 2024-01-01 VITALS — BP 137/74 | HR 60 | Ht 66.0 in | Wt 195.0 lb

## 2024-01-01 DIAGNOSIS — I10 Essential (primary) hypertension: Secondary | ICD-10-CM

## 2024-01-01 DIAGNOSIS — J4489 Other specified chronic obstructive pulmonary disease: Secondary | ICD-10-CM

## 2024-01-01 DIAGNOSIS — M51362 Other intervertebral disc degeneration, lumbar region with discogenic back pain and lower extremity pain: Secondary | ICD-10-CM | POA: Diagnosis not present

## 2024-01-01 DIAGNOSIS — I872 Venous insufficiency (chronic) (peripheral): Secondary | ICD-10-CM | POA: Diagnosis not present

## 2024-01-01 DIAGNOSIS — Z0001 Encounter for general adult medical examination with abnormal findings: Secondary | ICD-10-CM | POA: Diagnosis not present

## 2024-01-01 DIAGNOSIS — I4891 Unspecified atrial fibrillation: Secondary | ICD-10-CM

## 2024-01-01 DIAGNOSIS — M1711 Unilateral primary osteoarthritis, right knee: Secondary | ICD-10-CM

## 2024-01-01 DIAGNOSIS — M0579 Rheumatoid arthritis with rheumatoid factor of multiple sites without organ or systems involvement: Secondary | ICD-10-CM

## 2024-01-01 MED ORDER — TRAMADOL HCL 50 MG PO TABS: 50.0000 mg | ORAL_TABLET | Freq: Two times a day (BID) | ORAL | 0 refills | Status: DC | PRN

## 2024-01-01 MED ORDER — TRIAMCINOLONE ACETONIDE 0.1 % EX CREA: 1.0000 | TOPICAL_CREAM | Freq: Two times a day (BID) | CUTANEOUS | 0 refills | Status: AC

## 2024-01-01 NOTE — Assessment & Plan Note (Signed)
 Bilateral leg rash likely stasis dermatitis Kenalog  cream as needed for itching Needs to perform leg elevation for chronic leg swelling

## 2024-01-01 NOTE — Assessment & Plan Note (Signed)
 Has chronic low back pain, radiating towards RLE Advised to take Tylenol  as needed for mild to moderate pain, tramadol  as needed for severe pain Check x-ray of lumbar spine Referred to PM&R clinic for further management

## 2024-01-01 NOTE — Assessment & Plan Note (Signed)
 Rate controlled S/p Afib ablation on 05/23/23 by Dr. Nelly Laurence. No episodes of Afib since ablation.  On Eliquis Followed by cardiology

## 2024-01-01 NOTE — Assessment & Plan Note (Signed)
 BP Readings from Last 1 Encounters:  01/01/24 137/74   Usually well-controlled with amlodipine  and Ramipril  Reports BP being around 120s/70s at home, advised to hold ramipril  if BP less than 110/70 as he has mild dizziness and flushing upon standing up Counseled for compliance with the medications Advised DASH diet and moderate exercise/walking as tolerated

## 2024-01-01 NOTE — Assessment & Plan Note (Addendum)
 Severe right knee pain Tylenol  as needed for mild to moderate pain Tramadol  as needed for severe pain Unable to take oral NSAIDs due to being on anticoagulation and history of GERD Followed by Orthopedic Surgeon

## 2024-01-01 NOTE — Assessment & Plan Note (Signed)
 Physical exam as documented. Fasting blood tests reviewed from chart today. Advised to get Shingrix and Tdap vaccines at local pharmacy.

## 2024-01-01 NOTE — Progress Notes (Signed)
 Established Patient Office Visit  Subjective:  Patient ID: Joel Ngo Kitchen Sr., male    DOB: 1945/10/29  Age: 78 y.o. MRN: [id number]  CC:  Chief Complaint  Patient presents with   Medical Management of Chronic Issues    4 month f/u    Rash    Reports sx of skin redness on both legs.    Annual Exam    HPI Joel Timson Maish Sr. is a 77 y.o. male with past medical history of HTN, type II DM with HLD, RA, OA and obesity who presents for f/u of his chronic medical conditions.  HTN: BP is well-controlled. Takes medications regularly. Patient denies headache, dizziness, chest pain, dyspnea or palpitations.  Paroxysmal atrial fibrillation: Followed by cardiology.  He takes Eliquis  5 mg BID for AC.  He had cardiac ablation in 01/25.   Type II DM with HLD: Followed by Dr. Faythe at [facility].  He has an insulin  pump. His last HbA1C was 6.5 in 06/25.  He is taking insulin  according to his recommended regimen. He denies any fatigue, polyuria or polydipsia currently.  He is on statin for HLD.  He has had recurrent bronchitis.  He complains of chronic cough with clear expectoration.  He denies any hemoptysis currently.  He had VATS for recurrent pleural effusion after laparoscopic cholecystectomy.  Reports improvement in dyspnea recently.  He reports bilateral leg rash, which is chronic.  He has mild irritation around the rash area.  Of note, he has chronic mild leg swelling.  Denies orthopnea or PND currently.  He complains of chronic low back pain, which is worse for the last 2 weeks.  Neck pain is constant, sharp, radiating towards RLE and worse with prolonged standing.  He reports chronic R > L knee pain, followed by Dr. Vernetta.  He was planned to get right TKA, but had to be postponed due to her wife's fracture and later his postop complications after laparoscopic cholecystectomy.  Past Medical History:  Diagnosis Date   A-fib (HCC)    AKI (acute kidney injury) (HCC) 05/18/2021   Allergy     Anemia    Arthritis    all over   Bradycardia    Chronic bronchitis (HCC)    get it just about q yr (03/17/2014)   Complication of anesthesia    difficulty waking up   Daily headache    here lately (03/17/2014); relates to sinuses   GERD (gastroesophageal reflux disease)    Hard of hearing    hearing aides bilat   Hiatal hernia    History of blood transfusion 2008   related to OR   History of kidney stones    Hyperlipidemia    Hypertension    Pneumonia 1972 X 1   Rheumatoid arthritis (HCC)    Type II diabetes mellitus (HCC)    Wears glasses     Past Surgical History:  Procedure Laterality Date   ATRIAL FIBRILLATION ABLATION N/A 05/23/2023   Procedure: ATRIAL FIBRILLATION ABLATION;  Surgeon: Nancey Eulas BRAVO, MD;  Location: [facility];  Service: Cardiovascular;  Laterality: N/A;   broken finger      surgical repaired left hand 2nd finger    CARDIOVERSION N/A 11/08/2022   Procedure: CARDIOVERSION;  Surgeon: Alvan Ronal BRAVO, MD;  Location: [facility];  Service: Cardiovascular;  Laterality: N/A;   CHOLECYSTECTOMY  09/05/2023   cyst removed      per left knee/posteriorly   CYSTECTOMY Left 2009  behind knee   INGUINAL HERNIA REPAIR Right ?2010   IR CV LINE INJECTION  10/02/2023   IR CV LINE INJECTION  10/10/2023   IR GUIDED DRAIN W CATHETER PLACEMENT  10/11/2023   IR RADIOLOGIST EVAL & MGMT  10/19/2023   IR THORACENTESIS ASP PLEURAL SPACE W/IMG GUIDE  10/03/2023   JOINT REPLACEMENT     KNEE ARTHROSCOPY Bilateral 1980's   LIVER BIOPSY N/A 09/05/2023   Procedure: BIOPSY, LIVER;  Surgeon: Mavis Anes, MD;  Location: [facility];  Service: General;  Laterality: N/A;   PARTIAL KNEE ARTHROPLASTY Right 08/27/2015   Procedure: RIGHT UNICOMPARTMENTAL KNEE ARTHROPLASTY;  Surgeon: Lonni CINDERELLA Poli, MD;  Location: [facility];  Service: Orthopedics;  Laterality: Right;   REVISION TOTAL KNEE ARTHROPLASTY Left 2008   SHOULDER SURGERY Left 2019   TOTAL KNEE  ARTHROPLASTY Left 2003   VIDEO ASSISTED THORACOSCOPY (VATS)/DECORTICATION Right 10/14/2023   Procedure: VIDEO ASSISTED THORACOSCOPY (VATS)/DECORTICATION;  Surgeon: Kerrin Elspeth BROCKS, MD;  Location: [facility];  Service: Thoracic;  Laterality: Right;  DRAINAGE OF PLEURAL EFFUSION    Family History  Problem Relation Age of Onset   Cancer Father        prostate    Social History   Socioeconomic History   Marital status: Married    Spouse name: Joel Jones   Number of children: 3   Years of education: [employer] education level: Associate degree: occupational, Scientist, product/process development, or vocational program  Occupational History    Employer: UNEMPLOYED  Tobacco Use   Smoking status: Former    Current packs/day: 0.00    Average packs/day: 1 pack/day for 5.0 years (5.0 ttl pk-yrs)    Types: Cigarettes, Cigars    Start date: 05/16/1963    Quit date: 05/15/1968    Years since quitting: 55.6    Passive exposure: Never   Smokeless tobacco: Never  Vaping Use   Vaping status: Never Used  Substance and Sexual Activity   Alcohol use: No    Alcohol/week: 0.0 standard drinks of alcohol   Drug use: Never   Sexual activity: Yes  Other Topics Concern   Not on file  Social History Narrative   Patient lives at home with family.   Caffeine Use: occasionally   Social Drivers of Corporate investment banker Strain: Low Risk  (12/30/2023)   Overall Financial Resource Strain (CARDIA)    Difficulty of Paying Living Expenses: Not very hard  Food Insecurity: No Food Insecurity (12/30/2023)   Hunger Vital Sign    Worried About Running Out of Food in the Last Year: Never true    Ran Out of Food in the Last Year: Never true  Transportation Needs: No Transportation Needs (12/30/2023)   PRAPARE - Administrator, Civil Service (Medical): No    Lack of Transportation (Non-Medical): No  Physical Activity: Sufficiently Active (12/30/2023)   Exercise Vital Sign    Days of Exercise per Week: 7 days    Minutes  of Exercise per Session: 150+ min  Stress: Stress Concern Present (12/30/2023)   [employer] of Occupational Health - Occupational Stress Questionnaire    Feeling of Stress: Rather much  Social Connections: Moderately Integrated (12/30/2023)   Social Connection and Isolation Panel    Frequency of Communication with Friends and Family: More than three times a week    Frequency of Social Gatherings with Friends and Family: More than three times a week    Attends Religious Services: 1 to 4 times per year  Active Member of Clubs or Organizations: No    Attends Banker Meetings: Not on file    Marital Status: Married  Recent Concern: Social Connections - Moderately Isolated (10/08/2023)   Social Connection and Isolation Panel    Frequency of Communication with Friends and Family: More than three times a week    Frequency of Social Gatherings with Friends and Family: Once a week    Attends Religious Services: Never    Database administrator or Organizations: No    Attends Banker Meetings: Never    Marital Status: Married  Catering manager Violence: Not At Risk (11/28/2023)   Humiliation, Afraid, Rape, and Kick questionnaire    Fear of Current or Ex-Partner: No    Emotionally Abused: No    Physically Abused: No    Sexually Abused: No    Outpatient Medications Prior to Visit  Medication Sig Dispense Refill   Abatacept  (ORENCIA  IV) Inject 750 mg into the vein every 28 (twenty-eight) days.     Accu-Chek FastClix Lancets MISC use to check blood sugar fingerstick Six times a day IC-10 CODE: E11.65; Duration: 90 days     albuterol  (PROVENTIL ) (2.5 MG/3ML) 0.083% nebulizer solution Take 3 mLs (2.5 mg total) by nebulization every 6 (six) hours as needed for wheezing or shortness of breath. 360 mL 1   albuterol  (VENTOLIN  HFA) 108 (90 Base) MCG/ACT inhaler Inhale 2 puffs into the lungs every 6 (six) hours as needed for wheezing or shortness of breath. 18 g 1    amLODipine  (NORVASC ) 5 MG tablet Take 1 tablet (5 mg total) by mouth daily. 90 tablet 3   apixaban  (ELIQUIS ) 5 MG TABS tablet Take 1 tablet (5 mg total) by mouth 2 (two) times daily. 60 tablet 6   ascorbic acid  (VITAMIN C ) 500 MG tablet Take 500 mg by mouth in the morning.     calcium carbonate (OSCAL) 1500 (600 Ca) MG TABS tablet Take 600 mg of elemental calcium by mouth in the morning.     calcium carbonate (TUMS - DOSED IN MG ELEMENTAL CALCIUM) 500 MG chewable tablet Chew 1 tablet by mouth as needed for indigestion or heartburn.     cholecalciferol (VITAMIN D3) 25 MCG (1000 UNIT) tablet Take 1,000 Units by mouth in the morning.     Glucose Blood (ACCU-CHEK GUIDE VI) check blood sugar In Vitro Six times a day IC-10 CODE: E11.65; Duration: 100 days     Insulin  Aspart, w/Niacinamide, (FIASP ) 100 UNIT/ML SOLN 77 Units continuous. Insulin  pump     insulin  glargine (LANTUS ) 100 UNIT/ML injection Inject 16 Units into the skin as needed (If pump goes out).     KRILL OIL PO Take 400 mg by mouth in the morning.     loratadine  (CLARITIN ) 10 MG tablet Take 10 mg by mouth in the morning.     LYUMJEV  100 UNIT/ML SOLN 77 units/a day Injection in Medtronic insulin  pump ICD-10 diagnosis code E11.65     Multiple Vitamin (MULTIVITAMIN WITH MINERALS) TABS Take 1 tablet by mouth in the morning. Centrum     ondansetron  (ZOFRAN -ODT) 4 MG disintegrating tablet Take 1 tablet (4 mg total) by mouth every 8 (eight) hours as needed for nausea or vomiting. 12 tablet 0   ONE TOUCH ULTRA TEST test strip 1 each daily. Pt says 2-3 times daily  3   pantoprazole  (PROTONIX ) 40 MG tablet TAKE 1 TABLET BY MOUTH DAILY 90 tablet 3   Potassium 99 MG TABS Take 99  mg by mouth in the morning.     pravastatin  (PRAVACHOL ) 40 MG tablet Take 40 mg by mouth in the morning.     pyridOXINE (VITAMIN B6) 50 MG tablet Take 50 mg by mouth in the morning.     ramipril  (ALTACE ) 5 MG capsule Take 1 capsule (5 mg total) by mouth daily. 90 capsule 3    vitamin E  400 UNIT capsule Take 400 Units by mouth in the morning.     zinc gluconate 50 MG tablet Take 50 mg by mouth in the morning.     traMADol  (ULTRAM ) 50 MG tablet Take 1-2 tablets (50-100 mg total) by mouth 3 (three) times daily as needed for moderate pain (pain score 4-6). 30 tablet 0   No facility-administered medications prior to visit.    Allergies  Allergen Reactions   Codeine Swelling   Lipitor [Atorvastatin] Other (See Comments)    Muscle aches    Invokana [Canagliflozin] Other (See Comments)    Stomach upset    Losartan Nausea And Vomiting   Roxicodone  [Oxycodone ] Itching   Vicodin [Hydrocodone -Acetaminophen ] Itching    ROS Review of Systems  Constitutional:  Negative for chills and fever.  HENT:  Negative for congestion and sore throat.   Eyes:  Negative for pain and discharge.  Respiratory:  Positive for cough. Negative for shortness of breath.   Cardiovascular:  Negative for chest pain and palpitations.  Gastrointestinal:  Negative for diarrhea, nausea and vomiting.  Endocrine: Negative for polydipsia and polyuria.  Genitourinary:  Negative for dysuria and hematuria.  Musculoskeletal:  Positive for arthralgias and back pain. Negative for neck pain and neck stiffness.  Skin:  Negative for rash.  Neurological:  Negative for dizziness, weakness, numbness and headaches.  Psychiatric/Behavioral:  Positive for sleep disturbance. Negative for agitation and behavioral problems.       Objective:    Physical Exam Vitals reviewed.  Constitutional:      General: He is not in acute distress.    Appearance: He is obese. He is not diaphoretic.  HENT:     Head: Normocephalic and atraumatic.     Left Ear: No tenderness. A middle ear effusion is present.     Nose: Congestion present.     Mouth/Throat:     Mouth: Mucous membranes are moist.  Eyes:     General: No scleral icterus.    Extraocular Movements: Extraocular movements intact.  Cardiovascular:     Rate and  Rhythm: Normal rate and regular rhythm.     Heart sounds: Normal heart sounds. No murmur heard. Pulmonary:     Breath sounds: Normal breath sounds. No wheezing or rales.  Abdominal:     Palpations: Abdomen is soft.     Tenderness: There is no abdominal tenderness.  Musculoskeletal:     Cervical back: Neck supple. No tenderness.     Right lower leg: No edema.     Left lower leg: No edema.  Skin:    General: Skin is warm.     Findings: Rash (Brownish discoloration over bilateral legs) present.     Comments: Cyst over upper back  Neurological:     General: No focal deficit present.     Mental Status: He is alert and oriented to person, place, and time.     Cranial Nerves: No cranial nerve deficit.     Sensory: No sensory deficit.     Motor: Weakness (B/l LE - 4/5) present.  Psychiatric:        Mood  and Affect: Mood normal.        Behavior: Behavior normal.     BP 137/74   Pulse 60   Ht 5' 6 (1.676 m)   Wt 195 lb (88.5 kg)   SpO2 93%   BMI 31.47 kg/m  Wt Readings from Last 3 Encounters:  01/01/24 195 lb (88.5 kg)  12/25/23 195 lb (88.5 kg)  11/28/23 182 lb (82.6 kg)    Lab Results  Component Value Date   TSH 3.03 05/24/2016   Lab Results  Component Value Date   WBC 6.1 12/12/2023   HGB 12.6 (L) 12/12/2023   HCT 38.5 (L) 12/12/2023   MCV 87.7 12/12/2023   PLT 261 12/12/2023   Lab Results  Component Value Date   NA 136 11/08/2023   K 4.8 11/08/2023   CO2 22 11/08/2023   GLUCOSE 258 (H) 11/08/2023   BUN 26 11/08/2023   CREATININE 1.03 11/08/2023   BILITOT 0.5 11/08/2023   ALKPHOS 163 (H) 11/08/2023   AST 21 11/08/2023   ALT 16 11/08/2023   PROT 6.7 11/08/2023   ALBUMIN 4.2 11/08/2023   CALCIUM 9.5 11/08/2023   ANIONGAP 9 10/17/2023   EGFR 75 11/08/2023   Lab Results  Component Value Date   CHOL 120 07/03/2023   Lab Results  Component Value Date   HDL 34 (L) 07/03/2023   Lab Results  Component Value Date   LDLCALC 65 07/03/2023   Lab Results   Component Value Date   TRIG 103 07/03/2023   Lab Results  Component Value Date   CHOLHDL 3.5 07/03/2023   Lab Results  Component Value Date   HGBA1C 6.5 11/12/2023      Assessment & Plan:   Problem List Items Addressed This Visit       Cardiovascular and Mediastinum   Essential hypertension   BP Readings from Last 1 Encounters:  01/01/24 137/74   Usually well-controlled with amlodipine  and Ramipril  Reports BP being around 120s/70s at home, advised to hold ramipril  if BP less than 110/70 as he has mild dizziness and flushing upon standing up Counseled for compliance with the medications Advised DASH diet and moderate exercise/walking as tolerated      Atrial fibrillation (HCC)   Rate controlled S/p Afib ablation on 05/23/23 by Dr. Nancey. No episodes of Afib since ablation.  On Eliquis  Followed by cardiology        Respiratory   Asthmatic bronchitis , chronic (HCC)   Has chronic cough, but dyspnea has improved now Albuterol  PRN for dyspnea or wheezing Would add ICS if persistent symptoms Promethazine -DM for cough        Musculoskeletal and Integument   Osteoarthritis of right knee   Severe right knee pain Tylenol  as needed for mild to moderate pain Tramadol  as needed for severe pain Unable to take oral NSAIDs due to being on anticoagulation and history of GERD Followed by Orthopedic Surgeon      Relevant Medications   traMADol  (ULTRAM ) 50 MG tablet   Rheumatoid arthritis involving multiple sites with positive rheumatoid factor (HCC)   Followed by Rheumatology Gets Orencia  infusions      Relevant Medications   traMADol  (ULTRAM ) 50 MG tablet   Stasis dermatitis   Bilateral leg rash likely stasis dermatitis Kenalog  cream as needed for itching Needs to perform leg elevation for chronic leg swelling      Relevant Medications   triamcinolone  cream (KENALOG ) 0.1 %   Degeneration of intervertebral disc of lumbar region with discogenic  back pain and lower  extremity pain   Has chronic low back pain, radiating towards RLE Advised to take Tylenol  as needed for mild to moderate pain, tramadol  as needed for severe pain Check x-ray of lumbar spine Referred to PM&R clinic for further management      Relevant Medications   traMADol  (ULTRAM ) 50 MG tablet   Other Relevant Orders   DG Lumbar Spine Complete   Ambulatory referral to Physical Medicine Rehab     Other   Encounter for general adult medical examination with abnormal findings - Primary   Physical exam as documented. Fasting blood tests reviewed from chart today. Advised to get Shingrix and Tdap vaccines at local pharmacy.       Meds ordered this encounter  Medications   triamcinolone  cream (KENALOG ) 0.1 %    Sig: Apply 1 Application topically 2 (two) times daily.    Dispense:  30 g    Refill:  0   traMADol  (ULTRAM ) 50 MG tablet    Sig: Take 1 tablet (50 mg total) by mouth every 12 (twelve) hours as needed for moderate pain (pain score 4-6).    Dispense:  30 tablet    Refill:  0    Follow-up: Return in about 6 months (around 07/03/2024).    Suzzane MARLA Blanch, MD

## 2024-01-01 NOTE — Assessment & Plan Note (Addendum)
 Followed by Rheumatology Gets Orencia  infusions

## 2024-01-01 NOTE — Patient Instructions (Signed)
 Please apply Kenalog  cream over bilateral leg rash.  Please take Tylenol  for mild-moderate pain. Please take Tramadol  as needed for severe pain.  Please continue to take medications as prescribed.  Please continue to follow low carb diet and ambulate as tolerated.

## 2024-01-01 NOTE — Assessment & Plan Note (Signed)
 Has chronic cough, but dyspnea has improved now Albuterol  PRN for dyspnea or wheezing Would add ICS if persistent symptoms Promethazine -DM for cough

## 2024-01-08 ENCOUNTER — Telehealth: Payer: Self-pay | Admitting: Cardiology

## 2024-01-08 NOTE — Telephone Encounter (Signed)
 Called [facility].  She reports pt placed monitor himself stayed on for 1 day d/t pt sweating.  Monitor off Monday she replaced monitor on Thursday used tegaderm to keep in place.  Reports at home visit today monitor was blinking orange.  Monitor removed and to be placed in mail today.   Amy reports HR 52 BP 136/68 pt reports feels good except for back pain.  Advised Amy will see what monitor shows once uploaded.  If another monitor is needed will reach out to pt to set up.  She reports pt had a little redness/ skin irritation.

## 2024-01-08 NOTE — Telephone Encounter (Signed)
 Home health nurse called stating pt didn't get the full duration of the zio montior due to it falling off. He kept it on 4 to 5 days and will mail it back today. She'd requesting a callback. Please advise .

## 2024-01-09 ENCOUNTER — Ambulatory Visit: Payer: Self-pay | Admitting: Rheumatology

## 2024-01-09 ENCOUNTER — Telehealth: Payer: Self-pay

## 2024-01-09 ENCOUNTER — Ambulatory Visit (HOSPITAL_COMMUNITY)
Admission: RE | Admit: 2024-01-09 | Discharge: 2024-01-09 | Disposition: A | Discharge status: Home / Self Care | Source: Ambulatory Visit | Attending: Rheumatology | Admitting: Rheumatology

## 2024-01-09 VITALS — BP 155/62 | HR 50 | Temp 97.7°F | Resp 17

## 2024-01-09 DIAGNOSIS — Z79899 Other long term (current) drug therapy: Secondary | ICD-10-CM | POA: Diagnosis present

## 2024-01-09 DIAGNOSIS — M0579 Rheumatoid arthritis with rheumatoid factor of multiple sites without organ or systems involvement: Secondary | ICD-10-CM | POA: Diagnosis present

## 2024-01-09 LAB — CBC WITH DIFFERENTIAL/PLATELET
Abs Immature Granulocytes: 0.02 K/uL (ref 0.00–0.07)
Basophils Absolute: 0.1 K/uL (ref 0.0–0.1)
Basophils Relative: 1 %
Eosinophils Absolute: 0.2 K/uL (ref 0.0–0.5)
Eosinophils Relative: 3 %
HCT: 39.5 % (ref 39.0–52.0)
Hemoglobin: 13.1 g/dL (ref 13.0–17.0)
Immature Granulocytes: 0 %
Lymphocytes Relative: 32 %
Lymphs Abs: 2.2 K/uL (ref 0.7–4.0)
MCH: 28.6 pg (ref 26.0–34.0)
MCHC: 33.2 g/dL (ref 30.0–36.0)
MCV: 86.2 fL (ref 80.0–100.0)
Monocytes Absolute: 0.8 K/uL (ref 0.1–1.0)
Monocytes Relative: 12 %
Neutro Abs: 3.5 K/uL (ref 1.7–7.7)
Neutrophils Relative %: 52 %
Platelets: 237 K/uL (ref 150–400)
RBC: 4.58 MIL/uL (ref 4.22–5.81)
RDW: 14.6 % (ref 11.5–15.5)
WBC: 6.8 K/uL (ref 4.0–10.5)
nRBC: 0 % (ref 0.0–0.2)

## 2024-01-09 LAB — COMPREHENSIVE METABOLIC PANEL WITH GFR
ALT: 23 U/L (ref 0–44)
AST: 30 U/L (ref 15–41)
Albumin: 3.5 g/dL (ref 3.5–5.0)
Alkaline Phosphatase: 104 U/L (ref 38–126)
Anion gap: 8 (ref 5–15)
BUN: 12 mg/dL (ref 8–23)
CO2: 25 mmol/L (ref 22–32)
Calcium: 8.9 mg/dL (ref 8.9–10.3)
Chloride: 105 mmol/L (ref 98–111)
Creatinine, Ser: 1.1 mg/dL (ref 0.61–1.24)
GFR, Estimated: >60 mL/min (ref >60)
Glucose, Bld: 85 mg/dL (ref 70–99)
Potassium: 3.2 mmol/L — ABNORMAL LOW (ref 3.5–5.1)
Sodium: 138 mmol/L (ref 135–145)
Total Bilirubin: 0.6 mg/dL (ref 0.0–1.2)
Total Protein: 6.1 g/dL — ABNORMAL LOW (ref 6.5–8.1)

## 2024-01-09 MED ORDER — SODIUM CHLORIDE 0.9 % IV SOLN
750.0000 mg | INTRAVENOUS | Status: DC
  Administered 2024-01-09: 750 mg via INTRAVENOUS
  Filled 2024-01-09: qty 30

## 2024-01-09 MED ORDER — DIPHENHYDRAMINE HCL 25 MG PO CAPS: 25.0000 mg | ORAL_CAPSULE | ORAL | Status: DC

## 2024-01-09 MED ORDER — ACETAMINOPHEN 325 MG PO TABS: 650.0000 mg | ORAL_TABLET | ORAL | Status: DC

## 2024-01-09 NOTE — Telephone Encounter (Signed)
 Copied from CRM [id number]. Topic: Clinical - Home Health Verbal Orders >> Jan 09, 2024  8:36 AM Larissa RAMAN wrote: Caller/Agency: [facility] Callback Number: [phone] Service Requested: Skilled Nursing Frequency: 1 x 5  Any new concerns about the patient? Yes.  Heart rate was 44 and called cardiology and a monitor was placed on patient. This occurred two weeks ago and she wants to inform PCP

## 2024-01-09 NOTE — Progress Notes (Signed)
 Potassium is low, CBC is normal.  Please notify patient and advise him to contact his PCP regarding low potassium.  Please forward results to his PCP.

## 2024-01-10 NOTE — Progress Notes (Signed)
 Office Visit Note  Patient: Joel Skellenger Macapagal Sr.             Date of Birth: [birth year]           MRN: [id number]             PCP: [name], MD Referring: [name], MD Visit Date: 01/24/2024 Occupation: @GUAROCC @  Subjective:  Medication management  History of Present Illness: Jacari Iannello Salsberry Sr. is a 78 y.o. male with rheumatoid arthritis, osteoarthritis and degenerative disc disease.  He returns today after his last visit in July 2025.  He states he continues to have some lower back discomfort.  He has not noticed any joint swelling.  He has been off Orencia  since January 2025 due to recurrent infections and hospitalizations.  Orencia  was resumed on January 09, 2024.  He will need a right total knee replacement in the future by Dr. Vernetta.  He was having recurrent pulmonary effusion.  His last chest x-ray from November 20, 2023 showed small right pleural effusion which was stable.  He denies any shortness of breath.    Activities of Daily Living:  Patient reports morning stiffness for 10 minutes.   Patient Reports nocturnal pain.  Difficulty dressing/grooming: Denies Difficulty climbing stairs: Reports Difficulty getting out of chair: Reports Difficulty using hands for taps, buttons, cutlery, and/or writing: Reports  Review of Systems  Constitutional:  Positive for fatigue.  HENT:  Negative for mouth sores and mouth dryness.   Eyes:  Negative for dryness.  Respiratory:  Negative for shortness of breath.   Cardiovascular:  Negative for chest pain and palpitations.  Gastrointestinal:  Negative for blood in stool, constipation and diarrhea.  Endocrine: Negative for increased urination.  Genitourinary:  Negative for involuntary urination.  Musculoskeletal:  Positive for joint pain, gait problem, joint pain, joint swelling, muscle weakness and morning stiffness. Negative for myalgias, muscle tenderness and myalgias.  Skin:  Negative for color change, rash and sensitivity to  sunlight.  Allergic/Immunologic: Negative for susceptible to infections.  Neurological:  Positive for headaches. Negative for dizziness.  Hematological:  Negative for swollen glands.  Psychiatric/Behavioral:  Positive for sleep disturbance. Negative for depressed mood. The patient is not nervous/anxious.     PMFS History:  Patient Active Problem List   Diagnosis Date Noted   Stasis dermatitis 01/01/2024   Degeneration of intervertebral disc of lumbar region with discogenic back pain and lower extremity pain 01/01/2024   Encounter for general adult medical examination with abnormal findings 01/01/2024   Status post video-assisted thoracoscopic surgery (VATS) 10/25/2023   Physical deconditioning 10/25/2023   Acute blood loss anemia 10/25/2023   S/P laparoscopic cholecystectomy 09/28/2023   Hospital discharge follow-up 09/28/2023   Biliary sludge 09/05/2023   Drug-induced liver injury 08/15/2023   Hyperlipidemia associated with type 2 diabetes mellitus (HCC) 02/05/2023   Hypercoagulable state due to persistent atrial fibrillation (HCC) 12/19/2022   Atrial fibrillation (HCC) 12/12/2022   Encounter for therapeutic drug monitoring 12/12/2022   Acute mucoid otitis media of left ear 06/27/2022   Allergic sinusitis 02/24/2022   Asthmatic bronchitis , chronic (HCC) 01/24/2022   Primary insomnia 10/06/2021   Gastroesophageal reflux disease 05/18/2021   Type 2 diabetes mellitus with other specified complication (HCC) 05/18/2021   Long term (current) use of insulin  (HCC) 05/18/2021   Obesity 05/18/2021   Elevated transaminase level 05/18/2021   Tear of left rotator cuff 09/24/2017   DDD (degenerative disc disease), cervical 10/31/2016   Primary osteoarthritis of  both hands 10/31/2016   Primary osteoarthritis of both feet 10/31/2016   High risk medication use 07/06/2016   Rheumatoid arthritis involving multiple sites with positive rheumatoid factor (HCC) 06/02/2016   Contracture, elbow, left  06/02/2016   Osteoarthritis of right knee 08/27/2015   S/P TKR (total knee replacement), left 08/27/2015   Essential hypertension 05/11/2015   CAD (coronary artery disease) 04/02/2012   Sinus bradycardia     Past Medical History:  Diagnosis Date   A-fib (HCC)    AKI (acute kidney injury) (HCC) 05/18/2021   Allergy    Anemia    Arthritis    all over   Bradycardia    Chronic bronchitis (HCC)    get it just about q yr (03/17/2014)   Complication of anesthesia    difficulty waking up   Daily headache    here lately (03/17/2014); relates to sinuses   GERD (gastroesophageal reflux disease)    Hard of hearing    hearing aides bilat   Hiatal hernia    History of blood transfusion 2008   related to OR   History of kidney stones    Hyperlipidemia    Hypertension    Pneumonia 1972 X 1   Rheumatoid arthritis (HCC)    Type II diabetes mellitus (HCC)    Wears glasses     Family History  Problem Relation Age of Onset   Cancer Father        prostate   Past Surgical History:  Procedure Laterality Date   ATRIAL FIBRILLATION ABLATION N/A 05/23/2023   Procedure: ATRIAL FIBRILLATION ABLATION;  Surgeon: Nancey Eulas BRAVO, MD;  Location: [facility];  Service: Cardiovascular;  Laterality: N/A;   broken finger      surgical repaired left hand 2nd finger    CARDIOVERSION N/A 11/08/2022   Procedure: CARDIOVERSION;  Surgeon: Alvan Ronal BRAVO, MD;  Location: [facility];  Service: Cardiovascular;  Laterality: N/A;   CHOLECYSTECTOMY  09/05/2023   cyst removed      per left knee/posteriorly   CYSTECTOMY Left 2009   behind knee   INGUINAL HERNIA REPAIR Right ?2010   IR CV LINE INJECTION  10/02/2023   IR CV LINE INJECTION  10/10/2023   IR GUIDED DRAIN W CATHETER PLACEMENT  10/11/2023   IR RADIOLOGIST EVAL & MGMT  10/19/2023   IR THORACENTESIS ASP PLEURAL SPACE W/IMG GUIDE  10/03/2023   JOINT REPLACEMENT     KNEE ARTHROSCOPY Bilateral 1980's   LIVER BIOPSY N/A  09/05/2023   Procedure: BIOPSY, LIVER;  Surgeon: Mavis Anes, MD;  Location: [facility];  Service: General;  Laterality: N/A;   PARTIAL KNEE ARTHROPLASTY Right 08/27/2015   Procedure: RIGHT UNICOMPARTMENTAL KNEE ARTHROPLASTY;  Surgeon: Lonni CINDERELLA Poli, MD;  Location: [facility];  Service: Orthopedics;  Laterality: Right;   REVISION TOTAL KNEE ARTHROPLASTY Left 2008   SHOULDER SURGERY Left 2019   TOTAL KNEE ARTHROPLASTY Left 2003   VIDEO ASSISTED THORACOSCOPY (VATS)/DECORTICATION Right 10/14/2023   Procedure: VIDEO ASSISTED THORACOSCOPY (VATS)/DECORTICATION;  Surgeon: Kerrin Elspeth BROCKS, MD;  Location: [facility] OR;  Service: Thoracic;  Laterality: Right;  DRAINAGE OF PLEURAL EFFUSION   Social History   Social History Narrative   Patient lives at home with family.   Caffeine Use: occasionally   Immunization History  Administered Date(s) Administered   Pneumococcal Conjugate-13 01/13/2014   Pneumococcal Polysaccharide-23 05/15/2006, 09/16/2012, 03/29/2018     Objective: Vital Signs: BP (!) 145/67   Pulse (!) 49   Temp 97.7 F (  36.5 C)   Resp 17   Ht 5' 6 (1.676 m)   Wt 197 lb 9.6 oz (89.6 kg)   BMI 31.89 kg/m    Physical Exam Vitals and nursing note reviewed.  Constitutional:      Appearance: He is well-developed.  HENT:     Head: Normocephalic and atraumatic.  Eyes:     Conjunctiva/sclera: Conjunctivae normal.     Pupils: Pupils are equal, round, and reactive to light.  Cardiovascular:     Rate and Rhythm: Normal rate and regular rhythm.     Heart sounds: Normal heart sounds.  Pulmonary:     Effort: Pulmonary effort is normal.     Breath sounds: Normal breath sounds.  Abdominal:     General: Bowel sounds are normal.     Palpations: Abdomen is soft.  Musculoskeletal:     Cervical back: Normal range of motion and neck supple.  Skin:    General: Skin is warm and dry.     Capillary Refill: Capillary refill takes less than 2 seconds.  Neurological:     Mental Status:  He is alert and oriented to person, place, and time.  Psychiatric:        Behavior: Behavior normal.      Musculoskeletal Exam: He had limited range of motion of the cervical spine.  There was no tenderness over thoracic or lumbar spine.  He had limited range of motion of the lumbar spine.  Shoulders, elbows, wrist joints, MCPs were in good range of motion with no synovitis.  He had MCP, PIP and DIP thickening.  He had bilateral CMC thickening.  No synovitis was noted.  Hip joints were in good range of motion.  Left knee joint was replaced.  He had warmth on palpation of his right knee joint with painful range of motion.  There was no tenderness over ankles or MTPs.  CDAI Exam: CDAI Score: -- Patient Global: --; Provider Global: -- Swollen: --; Tender: -- Joint Exam 01/24/2024   No joint exam has been documented for this visit   There is currently no information documented on the homunculus. Go to the Rheumatology activity and complete the homunculus joint exam.  Investigation: No additional findings.  Imaging: LONG TERM MONITOR (3-14 DAYS) Result Date: 01/17/2024 Patch Wear Time:  10 days and 12 hours (2025-08-17T14:35:51-0400 to 2025-08-28T02:45:03-399) Patient had a min HR of 37 bpm, max HR of 179 bpm, and avg HR of 57 bpm. Predominant underlying rhythm was Sinus Rhythm. 74 Supraventricular Tachycardia runs occurred, the run with the fastest interval lasting 5 beats with a max rate of 179 bpm, the longest lasting 15.8 secs with an avg rate of 133 bpm. Some episodes of Supraventricular Tachycardia Mandigo be possible Atrial Tachycardia with variable block. Idioventricular Rhythm was present. Isolated SVEs were occasional (1.1%, 5269), SVE Couplets were  rare (<1.0%, 220), and SVE Triplets were rare (<1.0%, 50). Isolated VEs were rare (<1.0%, 1889), VE Couplets were rare (<1.0%, 15), and VE Triplets were rare (<1.0%, 1). Ventricular Bigeminy and Trigeminy were present. Sinus bradycardia, NSR,  sinus tachycardia Occasional PAC, PVC and couplet Short runs of SVT Brief run of idioventricular rhythm No prolonged pauses Redell Shallow   Recent Labs: Lab Results  Component Value Date   WBC 6.8 01/09/2024   HGB 13.1 01/09/2024   PLT 237 01/09/2024   NA 138 01/09/2024   K 3.2 (L) 01/09/2024   CL 105 01/09/2024   CO2 25 01/09/2024   GLUCOSE 85 01/09/2024  BUN 12 01/09/2024   CREATININE 1.10 01/09/2024   BILITOT 0.6 01/09/2024   ALKPHOS 104 01/09/2024   AST 30 01/09/2024   ALT 23 01/09/2024   PROT 6.1 (L) 01/09/2024   ALBUMIN 3.5 01/09/2024   CALCIUM 8.9 01/09/2024   GFRAA >60 01/26/2020   QFTBGOLDPLUS NEGATIVE 11/19/2023    Speciality Comments: Orencia  750mg  every 4 weeks TB gold negative on Jan 2019  Procedures:  No procedures performed Allergies: Codeine, Lipitor [atorvastatin], Invokana [canagliflozin], Losartan, Roxicodone  [oxycodone ], and Vicodin [hydrocodone -acetaminophen ]   Assessment / Plan:     Visit Diagnoses: Rheumatoid arthritis involving multiple sites with positive rheumatoid factor (HCC) - He was off Orencia  from January 2025 till August 2025 due to multiple procedures and then recurrent hospitalizations.  Orencia  was resumed in August.  Patient has been doing well with no synovitis.  However he continues to complain of pain and discomfort in his joints due to underlying osteoarthritis.  Pain is mostly in PIP and DIP joints and CMC joints.  He also has lower back pain.  High risk medication use - (IV Orencia  750 mg infusions every 28 days-on hold  January 2025-August 2025)-resumed in August 2025.January 09, 2024 CMP normal except potassium low at 3.2, albumin low at 6.1, CBC with differential normal, TB Gold negative, November 12, 2023 hemoglobin A1c 6.5.  Will get TB Gold annually.  Lipid panel will be checked with his next infusion.  Information reimmunization was placed in the AVS.  He was advised to hold Orencia  if he develops an infection and resume after the  infection resolves.  Annual skin examination was advised to screen for skin cancer while he is on Orencia .  Use of sunscreen and sun protection was discussed.  Primary osteoarthritis of both hands-he has rheumatoid arthritis and osteoarthritis overlap.  MCP PIP and DIP thickening was noted.  Joint protection muscle strengthening was discussed.  Status post right partial knee replacement - Dr. Vernetta.  He plans to have total knee replacement in the near future.  He continues to have pain and discomfort in his right knee.  S/P TKR (total knee replacement), left - X 2 by Dr. Lucious.  Doing well.  Primary osteoarthritis of both feet-fitting shoes were advised.  DDD (degenerative disc disease), cervical-8 good range of motion without discomfort.  Chronic midline lower back pain without sciatica-x-rays are pending which were ordered by Dr. Tobie.  Core strength exercises were advised.  Recurrent pleural effusion on right-the chest x-ray from July showed small stable pleural effusion.  Shortness of breath-resolved.  Degeneration of intervertebral disc of lumbar region without discogenic back pain or lower extremity pain - Followed by Dr. Burnetta.  He has been having increased lower back pain recently.  Other medical problems listed as follows:  History of diabetes mellitus  Dyslipidemia-I will check lipid panel with his next labs.  History of hypertension-blood pressure was elevated at 153/74 and repeat blood pressure was 145/67.  History of coronary artery disease  Atrial fibrillation, unspecified type (HCC) - underwent ablation recently.    Other fatigue  Chronic insomnia  Current use of long term anticoagulation-on Eliquis .  Orders: Orders Placed This Encounter  Procedures   Lipid panel   No orders of the defined types were placed in this encounter.    Follow-Up Instructions: Return in about 5 months (around 06/25/2024) for Rheumatoid arthritis, Osteoarthritis.   Maya Nash, MD  Note - This record has been created using Animal nutritionist.  Chart creation errors have been sought, but  Barron not always  have been located. Such creation errors do not reflect on  the standard of medical care.

## 2024-01-11 ENCOUNTER — Other Ambulatory Visit: Payer: Self-pay | Admitting: Internal Medicine

## 2024-01-11 ENCOUNTER — Telehealth: Payer: Self-pay

## 2024-01-11 DIAGNOSIS — E876 Hypokalemia: Secondary | ICD-10-CM

## 2024-01-11 MED ORDER — POTASSIUM CHLORIDE CRYS ER 20 MEQ PO TBCR: 20.0000 meq | EXTENDED_RELEASE_TABLET | Freq: Every day | ORAL | 1 refills | Status: AC

## 2024-01-11 NOTE — Telephone Encounter (Signed)
 Copied from CRM [id number]. Topic: Clinical - Medical Advice >> Jan 10, 2024  3:39 PM Carlatta H wrote: Reason for CRM: Patient would like to know what else can be done for low Potassium//Please call; [phone]

## 2024-01-11 NOTE — Telephone Encounter (Signed)
Pt informed

## 2024-01-16 ENCOUNTER — Ambulatory Visit (INDEPENDENT_AMBULATORY_CARE_PROVIDER_SITE_OTHER): Admitting: Physical Medicine and Rehabilitation

## 2024-01-16 ENCOUNTER — Encounter: Payer: Self-pay | Admitting: Physical Medicine and Rehabilitation

## 2024-01-16 DIAGNOSIS — M5416 Radiculopathy, lumbar region: Secondary | ICD-10-CM

## 2024-01-16 DIAGNOSIS — G8929 Other chronic pain: Secondary | ICD-10-CM | POA: Diagnosis not present

## 2024-01-16 DIAGNOSIS — M5441 Lumbago with sciatica, right side: Secondary | ICD-10-CM

## 2024-01-16 NOTE — Progress Notes (Signed)
 Pain Scale   Average Pain 10 Patient advising he has chronic lower back pain radiating to right knee area.         +Driver, -BT, -Dye Allergies.

## 2024-01-16 NOTE — Progress Notes (Signed)
 Joel BROCKS Massar Sr. - 78 y.o. male MRN [id number]  Date of birth: 04-08-1946  Office Visit Note: Visit Date: 01/16/2024 PCP: [name], MD Referred by: [name], MD  Subjective: Chief Complaint  Patient presents with   Lower Back - Pain   HPI: Joel Gaillard Gracy Sr. is a 78 y.o. male who comes in today as a self referral for evaluation of chronic, worsening and severe bilateral lower back pain radiating to right buttock and posterior leg to ankle. Pain ongoing for several years. His pain worsens with sitting. Prolonged standing and bending causes increased pain. He describes pain as constant aching sensation, currently rates as 10 out of 10. Some relief of pain with home exercise regimen, rest and use of medications. Some relief of pain with Tylenol  and Tramadol . I reviewed recent CT of abdomen that shows mild scoliosis, central canal stenosis at L3-L4 and L4-L5. No recent lumbar MRI imaging. He reports history of lumbar epidural steroid injections several years ago with good relief of pain. Patient denies focal weakness, numbness and tingling. No recent trauma or falls. He is managed by Dr. Lonni Poli from orthopedic standpoint.      Review of Systems  Musculoskeletal:  Positive for back pain.  Neurological:  Negative for tingling, sensory change, focal weakness and weakness.  All other systems reviewed and are negative.  Otherwise per HPI.  Assessment & Plan: Visit Diagnoses:    ICD-10-CM   1. Chronic bilateral low back pain with right-sided sciatica  G89.29    M54.41     2. Radiculopathy, lumbar region  M54.16        Plan: Findings:  Chronic, worsening and severe bilateral lower back pain radiating to right buttock and posterior leg to ankle. Patient continues to have severe pain despite good conservative therapies such as home exercise regimen, rest and use of medications. Patients clinical presentation and exam are consistent with lumbar radiculopathy, more of S1  nerve pattern. Dr. Eldonna and myself did review recent CT of abdomen and pelvis. There is central canal narrowing at L3-L4 and L4-L5. His symptoms do not fit with classic neurogenic claudication. We discussed treatment plan in detail today. Next step is to perform diagnostic and hopefully therapeutic right S1 transforaminal epidural steroid injection. Should his pain persist would be quick to obtain lumbar MRI imaging. He has no questions at this time. No red flag symptoms noted upon exam today.     Meds & Orders: No orders of the defined types were placed in this encounter.  No orders of the defined types were placed in this encounter.   Follow-up: No follow-ups on file.   Procedures: No procedures performed      Clinical History: Lumbar spine 2 views (2023): Degenerative changes throughout.  No acute fractures  or acute findings.  Slight retrolisthesis L2 on L3.  Disc base narrowing  L2-3 unchanged from prior films.  Slight scoliosis.   He reports that he quit smoking about 55 years ago. His smoking use included cigarettes and cigars. He started smoking about 60 years ago. He has a 5 pack-year smoking history. He has never been exposed to tobacco smoke. He has never used smokeless tobacco.  Recent Labs    05/07/23 0000 10/09/23 0422 11/12/23 0000  HGBA1C 8.3  --  6.5  LABURIC  --  2.8*  --     Objective:  VS:  HT:    WT:   BMI:     BP:  HR: bpm  TEMP: ( )  RESP:  Physical Exam Vitals and nursing note reviewed.  HENT:     Head: Normocephalic and atraumatic.     Right Ear: External ear normal.     Left Ear: External ear normal.     Nose: Nose normal.     Mouth/Throat:     Mouth: Mucous membranes are moist.  Eyes:     Extraocular Movements: Extraocular movements intact.  Cardiovascular:     Rate and Rhythm: Normal rate.     Pulses: Normal pulses.  Pulmonary:     Effort: Pulmonary effort is normal.  Abdominal:     General: Abdomen is flat. There is no distension.   Musculoskeletal:        General: Tenderness present.     Cervical back: Normal range of motion.     Comments: Exam of right lower extremity difficult likely due to chronic right knee pain. Patient is slow to rise from seated position to standing. Good lumbar range of motion. No pain noted with facet loading. 5/5 strength noted with bilateral hip flexion, knee flexion/extension, ankle dorsiflexion/plantarflexion and EHL. No clonus noted bilaterally. No pain upon palpation of greater trochanters. No pain with internal/external rotation of bilateral hips. Sensation intact bilaterally. Dysesthesias noted to right S1 dermatome. Negative slump test bilaterally. Ambulates without aid, gait steady.     Skin:    General: Skin is warm and dry.     Capillary Refill: Capillary refill takes less than 2 seconds.  Neurological:     General: No focal deficit present.     Mental Status: He is alert and oriented to person, place, and time.  Psychiatric:        Mood and Affect: Mood normal.        Behavior: Behavior normal.     Ortho Exam  Imaging: No results found.  Past Medical/Family/Surgical/Social History: Medications & Allergies reviewed per EMR, new medications updated. Patient Active Problem List   Diagnosis Date Noted   Stasis dermatitis 01/01/2024   Degeneration of intervertebral disc of lumbar region with discogenic back pain and lower extremity pain 01/01/2024   Encounter for general adult medical examination with abnormal findings 01/01/2024   Status post video-assisted thoracoscopic surgery (VATS) 10/25/2023   Physical deconditioning 10/25/2023   Acute blood loss anemia 10/25/2023   S/P laparoscopic cholecystectomy 09/28/2023   Hospital discharge follow-up 09/28/2023   Biliary sludge 09/05/2023   Drug-induced liver injury 08/15/2023   Hyperlipidemia associated with type 2 diabetes mellitus (HCC) 02/05/2023   Hypercoagulable state due to persistent atrial fibrillation (HCC) 12/19/2022    Atrial fibrillation (HCC) 12/12/2022   Encounter for therapeutic drug monitoring 12/12/2022   Acute mucoid otitis media of left ear 06/27/2022   Allergic sinusitis 02/24/2022   Asthmatic bronchitis , chronic (HCC) 01/24/2022   Primary insomnia 10/06/2021   Gastroesophageal reflux disease 05/18/2021   Type 2 diabetes mellitus with other specified complication (HCC) 05/18/2021   Long term (current) use of insulin  (HCC) 05/18/2021   Obesity 05/18/2021   Elevated transaminase level 05/18/2021   Tear of left rotator cuff 09/24/2017   DDD (degenerative disc disease), cervical 10/31/2016   Primary osteoarthritis of both hands 10/31/2016   Primary osteoarthritis of both feet 10/31/2016   High risk medication use 07/06/2016   Rheumatoid arthritis involving multiple sites with positive rheumatoid factor (HCC) 06/02/2016   Contracture, elbow, left 06/02/2016   Osteoarthritis of right knee 08/27/2015   S/P TKR (total knee replacement), left 08/27/2015  Essential hypertension 05/11/2015   CAD (coronary artery disease) 04/02/2012   Sinus bradycardia    Past Medical History:  Diagnosis Date   A-fib (HCC)    AKI (acute kidney injury) (HCC) 05/18/2021   Allergy    Anemia    Arthritis    all over   Bradycardia    Chronic bronchitis (HCC)    get it just about q yr (03/17/2014)   Complication of anesthesia    difficulty waking up   Daily headache    here lately (03/17/2014); relates to sinuses   GERD (gastroesophageal reflux disease)    Hard of hearing    hearing aides bilat   Hiatal hernia    History of blood transfusion 2008   related to OR   History of kidney stones    Hyperlipidemia    Hypertension    Pneumonia 1972 X 1   Rheumatoid arthritis (HCC)    Type II diabetes mellitus (HCC)    Wears glasses    Family History  Problem Relation Age of Onset   Cancer Father        prostate   Past Surgical History:  Procedure Laterality Date   ATRIAL FIBRILLATION ABLATION  N/A 05/23/2023   Procedure: ATRIAL FIBRILLATION ABLATION;  Surgeon: Nancey Eulas BRAVO, MD;  Location: [facility];  Service: Cardiovascular;  Laterality: N/A;   broken finger      surgical repaired left hand 2nd finger    CARDIOVERSION N/A 11/08/2022   Procedure: CARDIOVERSION;  Surgeon: Alvan Ronal BRAVO, MD;  Location: [facility];  Service: Cardiovascular;  Laterality: N/A;   CHOLECYSTECTOMY  09/05/2023   cyst removed      per left knee/posteriorly   CYSTECTOMY Left 2009   behind knee   INGUINAL HERNIA REPAIR Right ?2010   IR CV LINE INJECTION  10/02/2023   IR CV LINE INJECTION  10/10/2023   IR GUIDED DRAIN W CATHETER PLACEMENT  10/11/2023   IR RADIOLOGIST EVAL & MGMT  10/19/2023   IR THORACENTESIS ASP PLEURAL SPACE W/IMG GUIDE  10/03/2023   JOINT REPLACEMENT     KNEE ARTHROSCOPY Bilateral 1980's   LIVER BIOPSY N/A 09/05/2023   Procedure: BIOPSY, LIVER;  Surgeon: Mavis Anes, MD;  Location: [facility];  Service: General;  Laterality: N/A;   PARTIAL KNEE ARTHROPLASTY Right 08/27/2015   Procedure: RIGHT UNICOMPARTMENTAL KNEE ARTHROPLASTY;  Surgeon: Lonni CINDERELLA Poli, MD;  Location: [facility];  Service: Orthopedics;  Laterality: Right;   REVISION TOTAL KNEE ARTHROPLASTY Left 2008   SHOULDER SURGERY Left 2019   TOTAL KNEE ARTHROPLASTY Left 2003   VIDEO ASSISTED THORACOSCOPY (VATS)/DECORTICATION Right 10/14/2023   Procedure: VIDEO ASSISTED THORACOSCOPY (VATS)/DECORTICATION;  Surgeon: Kerrin Elspeth BROCKS, MD;  Location: [facility] OR;  Service: Thoracic;  Laterality: Right;  DRAINAGE OF PLEURAL EFFUSION   Social History   Occupational History    Employer: UNEMPLOYED  Tobacco Use   Smoking status: Former    Current packs/day: 0.00    Average packs/day: 1 pack/day for 5.0 years (5.0 ttl pk-yrs)    Types: Cigarettes, Cigars    Start date: 05/16/1963    Quit date: 05/15/1968    Years since quitting: 55.7    Passive exposure: Never   Smokeless tobacco: Never  Vaping Use    Vaping status: Never Used  Substance and Sexual Activity   Alcohol use: No    Alcohol/week: 0.0 standard drinks of alcohol   Drug use: Never   Sexual activity: Yes

## 2024-01-17 DIAGNOSIS — I480 Paroxysmal atrial fibrillation: Secondary | ICD-10-CM | POA: Diagnosis not present

## 2024-01-17 DIAGNOSIS — R001 Bradycardia, unspecified: Secondary | ICD-10-CM

## 2024-01-18 ENCOUNTER — Telehealth: Payer: Self-pay | Admitting: Cardiology

## 2024-01-18 NOTE — Telephone Encounter (Signed)
 Spoke with Amy who states she is trying to determine whether or not she needs to continue seeing patient and monitoring his HR and BP. She would like to know if any medication changes will be made based on results from his heart monitor. Advised that monitor results have not been reviewed yet.

## 2024-01-18 NOTE — Telephone Encounter (Signed)
 Amy with [facility] called in stating she is trying to discharge pt from home health therapy but she wanted to wait until heart monitor results to see if any changes to medication were going to be made. She also wanted to inform that pt PCP put him on of potassium and wonders if this could have made his hr low. Please advise.

## 2024-01-21 ENCOUNTER — Ambulatory Visit: Payer: Self-pay | Admitting: Nurse Practitioner

## 2024-01-24 ENCOUNTER — Ambulatory Visit: Attending: Rheumatology | Admitting: Rheumatology

## 2024-01-24 ENCOUNTER — Other Ambulatory Visit (HOSPITAL_COMMUNITY): Payer: Self-pay | Admitting: Rheumatology

## 2024-01-24 ENCOUNTER — Other Ambulatory Visit: Payer: Self-pay | Admitting: Pharmacist

## 2024-01-24 ENCOUNTER — Encounter: Payer: Self-pay | Admitting: Rheumatology

## 2024-01-24 ENCOUNTER — Telehealth: Payer: Self-pay | Admitting: Pharmacy Technician

## 2024-01-24 VITALS — BP 145/67 | HR 49 | Temp 97.7°F | Resp 17 | Ht 66.0 in | Wt 197.6 lb

## 2024-01-24 DIAGNOSIS — M0579 Rheumatoid arthritis with rheumatoid factor of multiple sites without organ or systems involvement: Secondary | ICD-10-CM

## 2024-01-24 DIAGNOSIS — M545 Low back pain, unspecified: Secondary | ICD-10-CM

## 2024-01-24 DIAGNOSIS — J90 Pleural effusion, not elsewhere classified: Secondary | ICD-10-CM

## 2024-01-24 DIAGNOSIS — Z96652 Presence of left artificial knee joint: Secondary | ICD-10-CM

## 2024-01-24 DIAGNOSIS — E785 Hyperlipidemia, unspecified: Secondary | ICD-10-CM

## 2024-01-24 DIAGNOSIS — M19071 Primary osteoarthritis, right ankle and foot: Secondary | ICD-10-CM

## 2024-01-24 DIAGNOSIS — Z79899 Other long term (current) drug therapy: Secondary | ICD-10-CM | POA: Diagnosis not present

## 2024-01-24 DIAGNOSIS — M51369 Other intervertebral disc degeneration, lumbar region without mention of lumbar back pain or lower extremity pain: Secondary | ICD-10-CM

## 2024-01-24 DIAGNOSIS — Z7901 Long term (current) use of anticoagulants: Secondary | ICD-10-CM

## 2024-01-24 DIAGNOSIS — Z8679 Personal history of other diseases of the circulatory system: Secondary | ICD-10-CM

## 2024-01-24 DIAGNOSIS — Z8639 Personal history of other endocrine, nutritional and metabolic disease: Secondary | ICD-10-CM

## 2024-01-24 DIAGNOSIS — Z96651 Presence of right artificial knee joint: Secondary | ICD-10-CM

## 2024-01-24 DIAGNOSIS — F5104 Psychophysiologic insomnia: Secondary | ICD-10-CM

## 2024-01-24 DIAGNOSIS — M19041 Primary osteoarthritis, right hand: Secondary | ICD-10-CM | POA: Diagnosis not present

## 2024-01-24 DIAGNOSIS — R0602 Shortness of breath: Secondary | ICD-10-CM

## 2024-01-24 DIAGNOSIS — M5030 Other cervical disc degeneration, unspecified cervical region: Secondary | ICD-10-CM

## 2024-01-24 DIAGNOSIS — G8929 Other chronic pain: Secondary | ICD-10-CM

## 2024-01-24 DIAGNOSIS — I4891 Unspecified atrial fibrillation: Secondary | ICD-10-CM

## 2024-01-24 DIAGNOSIS — M19042 Primary osteoarthritis, left hand: Secondary | ICD-10-CM

## 2024-01-24 DIAGNOSIS — R5383 Other fatigue: Secondary | ICD-10-CM

## 2024-01-24 DIAGNOSIS — M19072 Primary osteoarthritis, left ankle and foot: Secondary | ICD-10-CM

## 2024-01-24 NOTE — Patient Instructions (Signed)
 Vaccines You are taking a medication(s) that can suppress your immune system.  The following immunizations are recommended: Flu annually Covid-19  Td/Tdap (tetanus, diphtheria, pertussis) every 10 years Pneumonia (Prevnar 15 then Pneumovax 23 at least 1 year apart.  Alternatively, can take Prevnar 20 without needing additional dose) Shingrix: 2 doses from 4 weeks to 6 months apart  Please check with your PCP to make sure you are up to date.   If you have signs or symptoms of an infection or start antibiotics: First, call your PCP for workup of your infection. Hold your medication through the infection, until you complete your antibiotics, and until symptoms resolve if you take the following: Injectable medication (Actemra, Benlysta, Cimzia, Cosentyx, Enbrel, Humira , Kevzara, Orencia , Remicade, Simponi, Stelara, Taltz, Tremfya) Methotrexate Leflunomide  (Arava ) Mycophenolate (Cellcept) Xeljanz, Olumiant, or Rinvoq  Please get an annual skin examination to screen for skin cancer while you are on Orencia  .  Please use sunscreen and sun protection.

## 2024-01-24 NOTE — Telephone Encounter (Signed)
 Auth Submission: APPROVED Site of care: Site of care: [facility] Payer: [employer] MEDICARE Medication & CPT/J Code(s) submitted: Orencia  (Abatacept ) [id number] Diagnosis Code:  Route of submission (phone, fax, portal): PORTAL Phone # Fax # Auth type: Buy/Bill HB Units/visits requested: 750MG  Q4WKS Reference number: [id number] Approval from: 01/24/24 to 01/23/25

## 2024-01-24 NOTE — Progress Notes (Signed)
 Lipid panel ordered for upcoming infusion  Reshard Guillet, PharmD, MPH, BCPS, CPP Clinical Pharmacist [facility])

## 2024-02-06 ENCOUNTER — Ambulatory Visit (HOSPITAL_COMMUNITY)
Admission: RE | Admit: 2024-02-06 | Discharge: 2024-02-06 | Disposition: A | Discharge status: Home / Self Care | Source: Ambulatory Visit | Attending: Rheumatology | Admitting: Rheumatology

## 2024-02-06 VITALS — BP 134/63 | HR 43 | Temp 97.7°F | Resp 17 | Wt 191.0 lb

## 2024-02-06 DIAGNOSIS — Z79899 Other long term (current) drug therapy: Secondary | ICD-10-CM | POA: Insufficient documentation

## 2024-02-06 DIAGNOSIS — M0579 Rheumatoid arthritis with rheumatoid factor of multiple sites without organ or systems involvement: Secondary | ICD-10-CM | POA: Diagnosis present

## 2024-02-06 MED ORDER — SODIUM CHLORIDE 0.9 % IV SOLN
750.0000 mg | Freq: Once | INTRAVENOUS | Status: AC
  Administered 2024-02-06: 750 mg via INTRAVENOUS
  Filled 2024-02-06: qty 30

## 2024-02-06 MED ORDER — ACETAMINOPHEN 325 MG PO TABS: 650.0000 mg | ORAL_TABLET | Freq: Once | ORAL | Status: DC

## 2024-02-06 MED ORDER — DIPHENHYDRAMINE HCL 25 MG PO CAPS: 25.0000 mg | ORAL_CAPSULE | Freq: Once | ORAL | Status: DC

## 2024-02-08 ENCOUNTER — Ambulatory Visit

## 2024-02-12 ENCOUNTER — Ambulatory Visit (INDEPENDENT_AMBULATORY_CARE_PROVIDER_SITE_OTHER): Admitting: Pulmonary Disease

## 2024-02-12 ENCOUNTER — Ambulatory Visit

## 2024-02-12 VITALS — BP 158/73 | HR 46 | Ht 66.0 in | Wt 198.0 lb

## 2024-02-12 DIAGNOSIS — R0602 Shortness of breath: Secondary | ICD-10-CM | POA: Diagnosis not present

## 2024-02-12 DIAGNOSIS — J90 Pleural effusion, not elsewhere classified: Secondary | ICD-10-CM

## 2024-02-12 DIAGNOSIS — R0683 Snoring: Secondary | ICD-10-CM
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
## 2024-02-12 NOTE — Patient Instructions (Addendum)
 Use albuterol  inhaler 1-2 puffs every 4-6 hours as needed  We will schedule you for pulmonary function tests at the front desk  We will check a chest x-ray today to monitor the pleural effusion  Recommend starting a walking regimen, 20-30 minutes per day for 4-5 days per week  Follow up in 6 months, call sooner if needed

## 2024-02-13 ENCOUNTER — Ambulatory Visit (INDEPENDENT_AMBULATORY_CARE_PROVIDER_SITE_OTHER)

## 2024-02-13 DIAGNOSIS — R0602 Shortness of breath: Secondary | ICD-10-CM

## 2024-02-13 LAB — PULMONARY FUNCTION TEST
DL/VA % pred: 96 %
DL/VA: 3.83 ml/min/mmHg/L
DLCO cor % pred: 81 %
DLCO cor: 18.32 ml/min/mmHg
DLCO unc % pred: 77 %
DLCO unc: 17.5 ml/min/mmHg
FEF 25-75 Post: 2.58 L/s
FEF 25-75 Pre: 1.7 L/s
FEF2575-%Change-Post: 51 %
FEF2575-%Pred-Post: 139 %
FEF2575-%Pred-Pre: 91 %
FEV1-%Change-Post: 9 %
FEV1-%Pred-Post: 95 %
FEV1-%Pred-Pre: 87 %
FEV1-Post: 2.5 L
FEV1-Pre: 2.28 L
FEV1FVC-%Change-Post: 3 %
FEV1FVC-%Pred-Pre: 102 %
FEV6-%Change-Post: 6 %
FEV6-%Pred-Post: 95 %
FEV6-%Pred-Pre: 90 %
FEV6-Post: 3.27 L
FEV6-Pre: 3.07 L
FEV6FVC-%Change-Post: 0 %
FEV6FVC-%Pred-Post: 107 %
FEV6FVC-%Pred-Pre: 106 %
FVC-%Change-Post: 5 %
FVC-%Pred-Post: 89 %
FVC-%Pred-Pre: 84 %
FVC-Post: 3.27 L
FVC-Pre: 3.09 L
Post FEV1/FVC ratio: 76 %
Post FEV6/FVC ratio: 100 %
Pre FEV1/FVC ratio: 74 %
Pre FEV6/FVC Ratio: 99 %
RV % pred: 70 %
RV: 1.72 L
TLC % pred: 78 %
TLC: 5.08 L

## 2024-02-13 NOTE — Progress Notes (Signed)
 Full pft performed today

## 2024-02-13 NOTE — Patient Instructions (Signed)
 Full pft performed today

## 2024-02-15 ENCOUNTER — Encounter: Payer: Self-pay | Admitting: Pulmonary Disease

## 2024-02-19 ENCOUNTER — Encounter: Admitting: Physical Medicine and Rehabilitation

## 2024-02-20 ENCOUNTER — Ambulatory Visit: Attending: Cardiovascular Disease | Admitting: Cardiovascular Disease

## 2024-02-20 ENCOUNTER — Encounter: Payer: Self-pay | Admitting: Cardiovascular Disease

## 2024-02-20 VITALS — BP 168/73 | HR 47 | Ht 66.0 in | Wt 196.0 lb

## 2024-02-20 DIAGNOSIS — R001 Bradycardia, unspecified: Secondary | ICD-10-CM

## 2024-02-20 DIAGNOSIS — I4819 Other persistent atrial fibrillation: Secondary | ICD-10-CM | POA: Diagnosis not present

## 2024-02-20 NOTE — Patient Instructions (Signed)
 Medication Instructions:  Your physician recommends that you continue on your current medications as directed. Please refer to the Current Medication list given to you today.  *If you need a refill on your cardiac medications before your next appointment, please call your pharmacy*  Lab Work: None ordered.  If you have labs (blood work) drawn today and your tests are completely normal, you will receive your results only by: MyChart Message (if you have MyChart) OR A paper copy in the mail If you have any lab test that is abnormal or we need to change your treatment, we will call you to review the results.  Testing/Procedures: None ordered.   Follow-Up: At [facility], you and your health needs are our priority.  As part of our continuing mission to provide you with exceptional heart care, our providers are all part of one team.  This team includes your primary Cardiologist (physician) and Advanced Practice Providers or APPs (Physician Assistants and Nurse Practitioners) who all work together to provide you with the care you need, when you need it.  Your next appointment:   6 months with Afib clinic

## 2024-02-20 NOTE — Progress Notes (Signed)
  Electrophysiology Office Note:    Date:  02/20/2024   ID:  Joel BROCKS Cieslinski Sr., DOB 1945-11-05, MRN [id number]  PCP:  [name], MD   [facility] Providers Cardiologist:  Redell Shallow, MD     Referring MD: [name], MD   History of Present Illness:    Joel Puleo Macauley Sr. is a 78 y.o. male with a medical history significant for persistent atrial fibrillation, sinus bradycardia, rheumatoid arthritis, type 2 diabetes, mild nonobstructive coronary artery disease, moderate to severe tricuspid valve regurgitation referred for management of atrial fibrillation.      History of Present Illness       The patient was diagnosed with atrial fibrillation in Joel Jones 2024.  He underwent ablation in January 2025.  He experienced an episode of fatigue and dyspnea in August 2025 and a monitor was placed that showed some brief atrial runs but no sustained arrhythmia.  He was admitted in June 2025 with recurrent right sided pleural effusion.  Overall, he is doing well.  His primary complaint at this time is easy bleeding while on Eliquis .       Today, He is doing reasonably well.   EKGs/Labs/Other Studies Reviewed Today:     Echocardiogram:  TTE November 27, 2022 Ejection fraction 60 to 65%.  Mild bilateral atrial dilation   Monitors:  11-day ZIO monitor --worn for 6 days August 2025 Sinus rhythm, heart rate 37-122 beats minute, average 56 bpm. Multiple brief atrial runs noted.  PAC burden approximately 1%. 1 symptom episode correlated with sinus rhythm   Stress testing:   Advanced imaging:   Cardiac catherization   EKG:   EKG Interpretation Date/Time:  Wednesday February 20 2024 09:45:51 EDT Ventricular Rate:  47 PR Interval:  162 QRS Duration:  94 QT Interval:  456 QTC Calculation: 403 R Axis:   65  Text Interpretation: Sinus bradycardia When compared with ECG of 25-Dec-2023 13:50, No significant change was found Confirmed by Nancey Scotts [phone]) on  02/20/2024 10:42:56 AM     Physical Exam:    VS:  BP (!) 168/73 (BP Location: Right Arm, Patient Position: Sitting, Cuff Size: Large)   Pulse (!) 47   Ht 5' 6 (1.676 m)   Wt 196 lb (88.9 kg)   SpO2 96%   BMI 31.64 kg/m     Wt Readings from Last 3 Encounters:  02/20/24 196 lb (88.9 kg)  02/12/24 198 lb (89.8 kg)  02/06/24 191 lb (86.6 kg)     GEN: Well nourished, well developed in no acute distress CARDIAC: RRR, no murmurs, rubs, gallops RESPIRATORY:  Normal work of breathing MUSCULOSKELETAL: no edema    ASSESSMENT & PLAN:     Persistent Atrial fibrillation Diagnosed Joel Jones 2024 Symptomatic with fatigue Antiarrhythmic drug options are limited -- he has coronary calcification, sinus bradycardia Status post ablation January 2025, maintaining sinus rhythm Monitor showed some nonsustained atrial runs but no sustained atrial fibrillation.  Secondary hypercoagulable state CHA2DS2-VASc score is 5 Continue Eliquis  5 mg p.o. twice daily  Sinus bradycardia Limits therapeutic options somewhat      Signed, Scotts FORBES Nancey, MD  02/20/2024 10:43 AM     [facility]

## 2024-03-02 ENCOUNTER — Ambulatory Visit: Payer: Self-pay | Admitting: Pulmonary Disease

## 2024-03-03 ENCOUNTER — Telehealth: Payer: Self-pay | Admitting: Pulmonary Disease

## 2024-03-03 NOTE — Telephone Encounter (Signed)
 Pt does not want to proceed with the sleep study right now and wants to cancel it and will contact doctor if he would like to do sleep study

## 2024-03-05 ENCOUNTER — Emergency Department (HOSPITAL_COMMUNITY)

## 2024-03-05 ENCOUNTER — Encounter (HOSPITAL_COMMUNITY)

## 2024-03-05 ENCOUNTER — Other Ambulatory Visit: Payer: Self-pay

## 2024-03-05 ENCOUNTER — Emergency Department (HOSPITAL_COMMUNITY)
Admission: EM | Admit: 2024-03-05 | Discharge: 2024-03-05 | Disposition: A | Discharge status: Home / Self Care | Attending: Emergency Medicine | Admitting: Emergency Medicine

## 2024-03-05 DIAGNOSIS — R519 Headache, unspecified: Secondary | ICD-10-CM | POA: Insufficient documentation

## 2024-03-05 DIAGNOSIS — M545 Low back pain, unspecified: Secondary | ICD-10-CM | POA: Diagnosis not present

## 2024-03-05 DIAGNOSIS — Z794 Long term (current) use of insulin: Secondary | ICD-10-CM | POA: Insufficient documentation

## 2024-03-05 DIAGNOSIS — M25551 Pain in right hip: Secondary | ICD-10-CM | POA: Diagnosis not present

## 2024-03-05 DIAGNOSIS — R0781 Pleurodynia: Secondary | ICD-10-CM | POA: Insufficient documentation

## 2024-03-05 DIAGNOSIS — Z7901 Long term (current) use of anticoagulants: Secondary | ICD-10-CM | POA: Diagnosis not present

## 2024-03-05 DIAGNOSIS — Y92410 Unspecified street and highway as the place of occurrence of the external cause: Secondary | ICD-10-CM | POA: Insufficient documentation

## 2024-03-05 MED ORDER — IBUPROFEN 800 MG PO TABS
800.0000 mg | ORAL_TABLET | Freq: Once | ORAL | Status: AC
  Administered 2024-03-05: 800 mg via ORAL
  Filled 2024-03-05: qty 1
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
## 2024-03-05 NOTE — ED Notes (Signed)
 Patient transported to CT

## 2024-03-05 NOTE — Telephone Encounter (Signed)
 Noted

## 2024-03-05 NOTE — ED Notes (Signed)
 Pt transported to CT ?
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
## 2024-03-05 NOTE — ED Notes (Signed)
 Awaiting patient from lobby.

## 2024-03-06 ENCOUNTER — Ambulatory Visit: Payer: Self-pay | Admitting: Rheumatology

## 2024-03-06 ENCOUNTER — Ambulatory Visit (HOSPITAL_COMMUNITY)
Admission: RE | Admit: 2024-03-06 | Discharge: 2024-03-06 | Disposition: A | Discharge status: Home / Self Care | Source: Ambulatory Visit | Attending: Rheumatology | Admitting: Rheumatology

## 2024-03-06 VITALS — BP 150/61 | HR 50 | Temp 97.7°F | Resp 17

## 2024-03-06 DIAGNOSIS — M0579 Rheumatoid arthritis with rheumatoid factor of multiple sites without organ or systems involvement: Secondary | ICD-10-CM | POA: Diagnosis present

## 2024-03-06 DIAGNOSIS — Z79899 Other long term (current) drug therapy: Secondary | ICD-10-CM | POA: Diagnosis present

## 2024-03-06 LAB — CBC WITH DIFFERENTIAL/PLATELET
Abs Immature Granulocytes: 0.02 K/uL (ref 0.00–0.07)
Basophils Absolute: 0.1 K/uL (ref 0.0–0.1)
Basophils Relative: 1 %
Eosinophils Absolute: 0.1 K/uL (ref 0.0–0.5)
Eosinophils Relative: 1 %
HCT: 40 % (ref 39.0–52.0)
Hemoglobin: 13.4 g/dL (ref 13.0–17.0)
Immature Granulocytes: 0 %
Lymphocytes Relative: 31 %
Lymphs Abs: 2.7 K/uL (ref 0.7–4.0)
MCH: 29.2 pg (ref 26.0–34.0)
MCHC: 33.5 g/dL (ref 30.0–36.0)
MCV: 87.1 fL (ref 80.0–100.0)
Monocytes Absolute: 0.8 K/uL (ref 0.1–1.0)
Monocytes Relative: 9 %
Neutro Abs: 5 K/uL (ref 1.7–7.7)
Neutrophils Relative %: 58 %
Platelets: 252 K/uL (ref 150–400)
RBC: 4.59 MIL/uL (ref 4.22–5.81)
RDW: 14.6 % (ref 11.5–15.5)
WBC: 8.7 K/uL (ref 4.0–10.5)
nRBC: 0 % (ref 0.0–0.2)

## 2024-03-06 LAB — COMPREHENSIVE METABOLIC PANEL WITH GFR
ALT: 25 U/L (ref 0–44)
AST: 26 U/L (ref 15–41)
Albumin: 3.9 g/dL (ref 3.5–5.0)
Alkaline Phosphatase: 104 U/L (ref 38–126)
Anion gap: 9 (ref 5–15)
BUN: 17 mg/dL (ref 8–23)
CO2: 26 mmol/L (ref 22–32)
Calcium: 9.2 mg/dL (ref 8.9–10.3)
Chloride: 102 mmol/L (ref 98–111)
Creatinine, Ser: 0.92 mg/dL (ref 0.61–1.24)
GFR, Estimated: >60 mL/min (ref >60)
Glucose, Bld: 181 mg/dL — ABNORMAL HIGH (ref 70–99)
Potassium: 4.3 mmol/L (ref 3.5–5.1)
Sodium: 137 mmol/L (ref 135–145)
Total Bilirubin: 1.2 mg/dL (ref 0.0–1.2)
Total Protein: 6.7 g/dL (ref 6.5–8.1)

## 2024-03-06 MED ORDER — DIPHENHYDRAMINE HCL 25 MG PO CAPS: 25.0000 mg | ORAL_CAPSULE | Freq: Once | ORAL | Status: DC

## 2024-03-06 MED ORDER — ACETAMINOPHEN 325 MG PO TABS: 650.0000 mg | ORAL_TABLET | Freq: Once | ORAL | Status: DC

## 2024-03-06 MED ORDER — SODIUM CHLORIDE 0.9 % IV SOLN
750.0000 mg | Freq: Once | INTRAVENOUS | Status: AC
  Administered 2024-03-06: 750 mg via INTRAVENOUS
  Filled 2024-03-06: qty 30

## 2024-03-06 NOTE — Progress Notes (Signed)
 CBC normal, glucose is elevated at 181.  Please notify patient and forward results to his PCP.

## 2024-03-17 ENCOUNTER — Encounter: Payer: Self-pay | Admitting: Radiology

## 2024-03-31 ENCOUNTER — Telehealth (HOSPITAL_BASED_OUTPATIENT_CLINIC_OR_DEPARTMENT_OTHER): Payer: Self-pay

## 2024-03-31 NOTE — Telephone Encounter (Signed)
   Pre-operative Risk Assessment    Patient Name: Joel Lorey Maurin Sr.  DOB: 20-Oct-1945 MRN: [id number]   Date of last office visit: 02/20/2024 with Dr. Nancey Date of next office visit: None  Request for Surgical Clearance    Procedure:  Right total knee arthroplasty   Date of Surgery:  Clearance TBD                                 Surgeon:  Dr. Lonni Poli Surgeon's Group or Practice Name:  [facility] Phone number:  [phone] Fax number:  [phone]   Type of Clearance Requested:   - Medical  - Pharmacy:  Hold Apixaban  (Eliquis ) -Stop Eliquis  3 days prior   Type of Anesthesia:  Spinal vs General   Additional requests/questions:  None  SignedPatrcia Iverson CROME   03/31/2024, 3:07 PM

## 2024-04-03 ENCOUNTER — Ambulatory Visit (HOSPITAL_COMMUNITY)
Admission: RE | Admit: 2024-04-03 | Discharge: 2024-04-03 | Disposition: A | Discharge status: Home / Self Care | Source: Ambulatory Visit | Attending: Rheumatology | Admitting: Rheumatology

## 2024-04-03 VITALS — BP 134/56 | HR 44 | Temp 97.6°F | Resp 16

## 2024-04-03 DIAGNOSIS — M0579 Rheumatoid arthritis with rheumatoid factor of multiple sites without organ or systems involvement: Secondary | ICD-10-CM | POA: Diagnosis present

## 2024-04-03 MED ORDER — ACETAMINOPHEN 325 MG PO TABS: 650.0000 mg | ORAL_TABLET | Freq: Once | ORAL | Status: DC

## 2024-04-03 MED ORDER — DIPHENHYDRAMINE HCL 25 MG PO CAPS: 25.0000 mg | ORAL_CAPSULE | Freq: Once | ORAL | Status: DC

## 2024-04-03 MED ORDER — SODIUM CHLORIDE 0.9 % IV SOLN
750.0000 mg | Freq: Once | INTRAVENOUS | Status: AC
  Administered 2024-04-03: 750 mg via INTRAVENOUS
  Filled 2024-04-03: qty 30

## 2024-04-04 NOTE — Telephone Encounter (Signed)
 Patient with diagnosis of atrial fibrillation on Eliquis  for anticoagulation.    Procedure:  Right total knee arthroplasty    Date of Surgery:  Clearance TBD          CHA2DS2-VASc Score = 5   This indicates a 7.2% annual risk of stroke. The patient's score is based upon: CHF History: 0 HTN History: 1 Diabetes History: 1 Stroke History: 0 Vascular Disease History: 1 Age Score: 2 Gender Score: 0   CrCl 85 Platelet count 252  Patient has not had an Afib/aflutter ablation in the last 3 months, DCCV within the last 4 weeks or a watchman implanted in the last 45 days   Per office protocol, patient can hold Eliquis  for 3 days prior to procedure.   Patient will not need bridging with Lovenox  (enoxaparin ) around procedure.  **This guidance is not considered finalized until pre-operative APP has relayed final recommendations.**

## 2024-04-04 NOTE — Telephone Encounter (Signed)
 1st attempt to reach pt regarding surgical clearance and the need for an TELE appointment.  Spouse answered and stated that the patient wasn't available to schedule the appt at this time and will  call back later.

## 2024-04-04 NOTE — Telephone Encounter (Signed)
   Name: Joel Molden Coffie Sr.  DOB: August 15, 1945  MRN: [id number]  Primary Cardiologist: Redell Shallow, MD   Preoperative team, please contact this patient and set up a phone call appointment for further preoperative risk assessment. Please obtain consent and complete medication review. Thank you for your help.  I confirm that guidance regarding antiplatelet and oral anticoagulation therapy has been completed and, if necessary, noted below.  Per office protocol, patient can hold Eliquis  for 3 days prior to procedure.   Patient will not need bridging with Lovenox  (enoxaparin ) around procedure.    I also confirmed the patient resides in the state of [place] . As per [facility] Medical Board telemedicine laws, the patient must reside in the state in which the provider is licensed.   Lamarr Satterfield, NP 04/04/2024, 10:25 AM [facility]

## 2024-04-07 ENCOUNTER — Telehealth: Payer: Self-pay | Admitting: Orthopedic Surgery

## 2024-04-07 NOTE — Telephone Encounter (Signed)
 I called and left a message for [name] to please return my call to discuss scheduling his right knee surgery.

## 2024-04-08 ENCOUNTER — Telehealth (HOSPITAL_BASED_OUTPATIENT_CLINIC_OR_DEPARTMENT_OTHER): Payer: Self-pay | Admitting: *Deleted

## 2024-04-08 NOTE — Telephone Encounter (Signed)
 Pt has been scheduled tele preop appt 04/18/24. Pt tells me the surgery is planned for 05/02/24, surgeon office called him yesterday and scheduled the procedure. Med rec and consent are done.

## 2024-04-08 NOTE — Telephone Encounter (Signed)
 Pt has been scheduled tele preop appt 04/18/24. Pt tells me the surgery is planned for 05/02/24, surgeon office called him yesterday and scheduled the procedure. Med rec and consent are done.       Patient Consent for Virtual Visit        Firman Petrow Leppert Sr. has provided verbal consent on 04/08/2024 for a virtual visit (video or telephone).   CONSENT FOR VIRTUAL VISIT FOR:  Cornie Herrington Mervine Sr.  By participating in this virtual visit I agree to the following:  I hereby voluntarily request, consent and authorize [facility] and its employed or contracted physicians, physician assistants, nurse practitioners or other licensed health care professionals (the Practitioner), to provide me with telemedicine health care services (the "Services) as deemed necessary by the treating Practitioner. I acknowledge and consent to receive the Services by the Practitioner via telemedicine. I understand that the telemedicine visit will involve communicating with the Practitioner through live audiovisual communication technology and the disclosure of certain medical information by electronic transmission. I acknowledge that I have been given the opportunity to request an in-person assessment or other available alternative prior to the telemedicine visit and am voluntarily participating in the telemedicine visit.  I understand that I have the right to withhold or withdraw my consent to the use of telemedicine in the course of my care at any time, without affecting my right to future care or treatment, and that the Practitioner or I Acord terminate the telemedicine visit at any time. I understand that I have the right to inspect all information obtained and/or recorded in the course of the telemedicine visit and Deatley receive copies of available information for a reasonable fee.  I understand that some of the potential risks of receiving the Services via telemedicine include:  Delay or interruption in medical evaluation  due to technological equipment failure or disruption; Information transmitted Safi not be sufficient (e.g. poor resolution of images) to allow for appropriate medical decision making by the Practitioner; and/or  In rare instances, security protocols could fail, causing a breach of personal health information.  Furthermore, I acknowledge that it is my responsibility to provide information about my medical history, conditions and care that is complete and accurate to the best of my ability. I acknowledge that Practitioner's advice, recommendations, and/or decision Heney be based on factors not within their control, such as incomplete or inaccurate data provided by me or distortions of diagnostic images or specimens that Paino result from electronic transmissions. I understand that the practice of medicine is not an exact science and that Practitioner makes no warranties or guarantees regarding treatment outcomes. I acknowledge that a copy of this consent can be made available to me via my patient portal [facility] MyChart), or I can request a printed copy by calling the office of  [facility].    I understand that my insurance will be billed for this visit.   I have read or had this consent read to me. I understand the contents of this consent, which adequately explains the benefits and risks of the Services being provided via telemedicine.  I have been provided ample opportunity to ask questions regarding this consent and the Services and have had my questions answered to my satisfaction. I give my informed consent for the services to be provided through the use of telemedicine in my medical care

## 2024-04-09 ENCOUNTER — Other Ambulatory Visit: Payer: Self-pay | Admitting: Physician Assistant

## 2024-04-09 DIAGNOSIS — Z01818 Encounter for other preprocedural examination: Secondary | ICD-10-CM

## 2024-04-17 NOTE — Progress Notes (Addendum)
 Date of COVID positive in last 90 days:  No  PCP - Suzzane Blanch, MD Cardiologist - Redell Shallow, MD Pulmonologist - Dorn Chill, MD  Cardiac clearance in Epic dated 04-18-24  Chest x-ray - 1V 03-05-24 Epic EKG - 02-20-24 Epic Stress Test - 03-20-23 Epic ECHO - 11-15-23 Epic Cardiac Cath - Yes Long Term Monitor 01-16-24 Epic Afib ablation - 05-23-23 Epic Cardioversion - 11-08-22 Epic Cardiac CT - 04-25-23 Epic Pacemaker/ICD device last checked:N/A Spinal Cord Stimulator:N/A  Bowel Prep - N/A  Sleep Study - N/A CPAP -   Fasting Blood Sugar - 50 to 150 Checks Blood Sugar - 3 to 4  times a day  Last dose of GLP1 agonist-  N/A GLP1 instructions:  Do not take after     Last dose of SGLT-2 inhibitors-  N/A SGLT-2 instructions:  Do not take after    Blood Thinner Instructions: Eliquis .  Will take last dose on 04-28-24 at 7 PM per patient Aspirin  Instructions:N/A Last Dose:  Activity level:  Can go up a flight of stairs and perform activities of daily living without stopping and without symptoms of chest pain or shortness of breath.  Some limitations due to knee pain  Anesthesia review: CAD, Afib, HTN  Pt has insulin  pump for type 2 diabetes, diabetic coordinator notified and gave recommendations  Patient denies shortness of breath, fever, cough and chest pain at PAT appointment  Patient verbalized understanding of instructions that were given to them at the PAT appointment. Patient was also instructed that they will need to review over the PAT instructions again at home before surgery.

## 2024-04-17 NOTE — Patient Instructions (Addendum)
 SURGICAL WAITING ROOM VISITATION Patients having surgery or a procedure [place] have no more than 2 support people in the waiting area - these visitors Tangredi rotate.    Children under the age of 107 must have an adult with them who is not the patient.  If the patient needs to stay at the hospital during part of their recovery, the visitor guidelines for inpatient rooms apply. Pre-op nurse will coordinate an appropriate time for 1 support person to accompany patient in pre-op.  This support person Thieme not rotate.    Please refer to the [facility] website for the visitor guidelines for Inpatients (after your surgery is over and you are in a regular room).       Your procedure is scheduled on: 05-02-24   [facility]    Report to admitting at 11:15 AM   Call this number if you have problems the morning of surgery [phone]   Do not eat food :After Midnight.   After Midnight you Gotwalt have the following liquids until 10:45 AM DAY OF SURGERY  Water  Non-Citrus Juices (without pulp, NO RED-Apple, White grape, White cranberry) Black Coffee (NO MILK/CREAM OR CREAMERS, sugar ok)  Clear Tea (NO MILK/CREAM OR CREAMERS, sugar ok) regular and decaf                             Plain Jell-O (NO RED)                                           Fruit ices (not with fruit pulp, NO RED)                                     Popsicles (NO RED)                                                               Sports drinks like Gatorade (NO RED)                   The day of surgery:  Drink ONE (1) Pre-Surgery G2 by 10:45 AM the morning of surgery. Drink in one sitting. Do not sip.  This drink was given to you during your hospital  pre-op appointment visit. Nothing else to drink after completing the Pre-Surgery G2.          If you have questions, please contact your surgeons office.   FOLLOW  ANY ADDITIONAL PRE OP INSTRUCTIONS YOU RECEIVED FROM YOUR SURGEON'S OFFICE!!!      Oral Hygiene is also important to reduce your risk of infection.                                    Remember - BRUSH YOUR TEETH THE MORNING OF SURGERY WITH YOUR REGULAR TOOTHPASTE   Do NOT smoke after Midnight   Take these medicines the morning of surgery with A SIP OF WATER :    Amlodipine    Claritin   Pantoprazole    Pravastatin    If needed Ondansetron , Tramadol    Okay to use inhalers  Stop all vitamins and herbal supplements 7 days before surgery                              You [place]erritt not have any metal on your body including jewelry, and body piercing             Do not wear lotions, powders, cologne, or deodorant              Men Sweda shave face and neck.   Do not bring valuables to the hospital. [facility] IS NOT RESPONSIBLE   FOR VALUABLES.   Contacts, dentures or bridgework Handshoe not be worn into surgery.   Bring small overnight bag day of surgery.   DO NOT BRING YOUR HOME MEDICATIONS TO THE HOSPITAL. PHARMACY WILL DISPENSE MEDICATIONS LISTED ON YOUR MEDICATION LIST TO YOU DURING YOUR ADMISSION IN THE HOSPITAL!     Special Instructions: Bring a copy of your healthcare power of attorney and living will documents the day of surgery if you haven't scanned them before.              Please read over the following fact sheets you were given: IF YOU HAVE QUESTIONS ABOUT YOUR PRE-OP INSTRUCTIONS PLEASE CALL [phone] Gwen  If you received a COVID test during your pre-op visit  it is requested that you wear a mask when out in public, stay away from anyone that [place] not be feeling well and notify your surgeon if you develop symptoms. If you test positive for Covid or have been in contact with anyone that has tested positive in the last 10 days please notify you surgeon.    Pre-operative 4 CHG Bath Instructions  DYNA-Hex 4 Chlorhexidine  Gluconate 4% Solution Antiseptic 4 fl. oz   You can play a key role in reducing the risk of infection after surgery. Your skin needs to be as  free of germs as possible. You can reduce the number of germs on your skin by washing with CHG (chlorhexidine  gluconate) soap before surgery. CHG is an antiseptic soap that kills germs and continues to kill germs even after washing.   DO NOT use if you have an allergy to chlorhexidine /CHG or antibacterial soaps. If your skin becomes reddened or irritated, stop using the CHG and notify one of our RNs at   Please shower with the CHG soap starting 4 days before surgery using the following schedule:     Please keep in mind the following:  DO NOT shave, including legs and underarms, starting the day of your first shower.   You [place] shave your face at any point before/day of surgery.  Place clean sheets on your bed the day you start using CHG soap. Use a clean washcloth (not used since being washed) for each shower. DO NOT sleep with pets once you start using the CHG.  CHG Shower Instructions:  If you choose to wash your hair and private area, wash first with your normal shampoo/soap.  After you use shampoo/soap, rinse your hair and body thoroughly to remove shampoo/soap residue.  Turn the water  OFF and apply about 3 tablespoons (45 ml) of CHG soap to a CLEAN washcloth.  Apply CHG soap ONLY FROM YOUR NECK DOWN TO YOUR TOES (washing for 3-5 minutes)  DO NOT use CHG soap on face, private areas, open wounds, or sores.  Pay special attention to the area where your surgery is being performed.  If you are having back surgery, having someone wash your back for you [place] be helpful. Wait 2 minutes after CHG soap is applied, then you G[place] rinse off the CHG soap.  Pat dry with a clean towel  Put on clean clothes/pajamas   If you choose to wear lotion, please use ONLY the CHG-compatible lotions on the back of this paper.     Additional instructions for the day of surgery:  Shower with regular soap the day of surgery DO NOT APPLY any lotions, deodorants, cologne, or perfumes.   Put on clean/comfortable  clothes.  Brush your teeth.  Ask your nurse before applying any prescription medications to the skin.   CHG Compatible Lotions   Aveeno Moisturizing lotion  Cetaphil Moisturizing Cream  Cetaphil Moisturizing Lotion  Clairol Herbal Essence Moisturizing Lotion, Dry Skin  Clairol Herbal Essence Moisturizing Lotion, Extra Dry Skin  Clairol Herbal Essence Moisturizing Lotion, Normal Skin  Curel Age Defying Therapeutic Moisturizing Lotion with Alpha Hydroxy  Curel Extreme Care Body Lotion  Curel Soothing Hands Moisturizing Hand Lotion  Curel Therapeutic Moisturizing Cream, Fragrance-Free  Curel Therapeutic Moisturizing Lotion, Fragrance-Free  Curel Therapeutic Moisturizing Lotion, Original Formula  Eucerin Daily Replenishing Lotion  Eucerin Dry Skin Therapy Plus Alpha Hydroxy Crme  Eucerin Dry Skin Therapy Plus Alpha Hydroxy Lotion  Eucerin Original Crme  Eucerin Original Lotion  Eucerin Plus Crme Eucerin Plus Lotion  Eucerin TriLipid Replenishing Lotion  Keri Anti-Bacterial Hand Lotion  Keri Deep Conditioning Original Lotion Dry Skin Formula Softly Scented  Keri Deep Conditioning Original Lotion, Fragrance Free Sensitive Skin Formula  Keri Lotion Fast Absorbing Fragrance Free Sensitive Skin Formula  Keri Lotion Fast Absorbing Softly Scented Dry Skin Formula  Keri Original Lotion  Keri Skin Renewal Lotion Keri Silky Smooth Lotion  Keri Silky Smooth Sensitive Skin Lotion  Nivea Body Creamy Conditioning Oil  Nivea Body Extra Enriched Lotion  Nivea Body Original Lotion  Nivea Body Sheer Moisturizing Lotion Nivea Crme  Nivea Skin Firming Lotion  NutraDerm 30 Skin Lotion  NutraDerm Skin Lotion  NutraDerm Therapeutic Skin Cream  NutraDerm Therapeutic Skin Lotion  ProShield Protective Hand Cream  Provon moisturizing lotion   PATIENT SIGNATURE_________________________________  NURSE  SIGNATURE__________________________________  ________________________________________________________________________    Nasario Exon  An incentive spirometer is a tool that can help keep your lungs clear and active. This tool measures how well you are filling your lungs with each breath. Taking long deep breaths Erny help reverse or decrease the chance of developing breathing (pulmonary) problems (especially infection) following: A long period of time when you are unable to move or be active. BEFORE THE PROCEDURE  If the spirometer includes an indicator to show your best effort, your nurse or respiratory therapist will set it to a desired goal. If possible, sit up straight or lean slightly forward. Try not to slouch. Hold the incentive spirometer in an upright position. INSTRUCTIONS FOR USE  Sit on the edge of your bed if possible, or sit up as far as you can in bed or on a chair. Hold the incentive spirometer in an upright position. Breathe out normally. Place the mouthpiece in your mouth and seal your lips tightly around it. Breathe in slowly and as deeply as possible, raising the piston or the ball toward the top of the column. Hold your breath for 3-5 seconds or for as long as possible. Allow the piston or ball to fall to the bottom  of the column. Remove the mouthpiece from your mouth and breathe out normally. Rest for a few seconds and repeat Steps 1 through 7 at least 10 times every 1-2 hours when you are awake. Take your time and take a few normal breaths between deep breaths. The spirometer [place] include an indicator to show your best effort. Use the indicator as a goal to work toward during each repetition. After each set of 10 deep breaths, practice coughing to be sure your lungs are clear. If you have an incision (the cut made at the time of surgery), support your incision when coughing by placing a pillow or rolled up towels firmly against it. Once you are able to get out of  bed, walk around indoors and cough well. You [place] stop using the incentive spirometer when instructed by your caregiver.  RISKS AND COMPLICATIONS Take your time so you do not get dizzy or light-headed. If you are in pain, you [place] need to take or ask for pain medication before doing incentive spirometry. It is harder to take a deep breath if you are having pain. AFTER USE Rest and breathe slowly and easily. It can be helpful to keep track of a log of your progress. Your caregiver can provide you with a simple table to help with this. If you are using the spirometer at home, follow these instructions: SEEK MEDICAL CARE IF:  You are having difficultly using the spirometer. You have trouble using the spirometer as often as instructed. Your pain medication is not giving enough relief while using the spirometer. You develop fever of 100.5 F (38.1 C) or higher. SEEK IMMEDIATE MEDICAL CARE IF:  You cough up bloody sputum that had not been present before. You develop fever of 102 F (38.9 C) or greater. You develop worsening pain at or near the incision site. MAKE SURE YOU:  Understand these instructions. Will watch your condition. Will get help right away if you are not doing well or get worse. Document Released: 09/11/2006 Document Revised: 07/24/2011 Document Reviewed: 11/12/2006 ExitCare Patient Information 2014 [employer], [place].   ________________________________________________________________________ WHAT IS A BLOOD TRANSFUSION? Blood Transfusion Information  A transfusion is the replacement of blood or some of its parts. Blood is made up of multiple cells which provide different functions. Red blood cells carry oxygen and are used for blood loss replacement. White blood cells fight against infection. Platelets control bleeding. Plasma helps clot blood. Other blood products are available for specialized needs, such as hemophilia or other clotting disorders. BEFORE THE TRANSFUSION   Who gives blood for transfusions?  Healthy volunteers who are fully evaluated to make sure their blood is safe. This is blood bank blood. Transfusion therapy is the safest it has ever been in the practice of medicine. Before blood is taken from a donor, a complete history is taken to make sure that person has no history of diseases nor engages in risky social behavior (examples are intravenous drug use or sexual activity with multiple partners). The donor's travel history is screened to minimize risk of transmitting infections, such as malaria. The donated blood is tested for signs of infectious diseases, such as HIV and hepatitis. The blood is then tested to be sure it is compatible with you in order to minimize the chance of a transfusion reaction. If you or a relative donates blood, this is often done in anticipation of surgery and is not appropriate for emergency situations. It takes many days to process the donated blood. RISKS AND COMPLICATIONS  Although transfusion therapy is very safe and saves many lives, the main dangers of transfusion include:  Getting an infectious disease. Developing a transfusion reaction. This is an allergic reaction to something in the blood you were given. Every precaution is taken to prevent this. The decision to have a blood transfusion has been considered carefully by your caregiver before blood is given. Blood is not given unless the benefits outweigh the risks. AFTER THE TRANSFUSION Right after receiving a blood transfusion, you will usually feel much better and more energetic. This is especially true if your red blood cells have gotten low (anemic). The transfusion raises the level of the red blood cells which carry oxygen, and this usually causes an energy increase. The nurse administering the transfusion will monitor you carefully for complications. HOME CARE INSTRUCTIONS  No special instructions are needed after a transfusion. You Lewan find your energy is  better. Speak with your caregiver about any limitations on activity for underlying diseases you [place] have. SEEK MEDICAL CARE IF:  Your condition is not improving after your transfusion. You develop redness or irritation at the intravenous (IV) site. SEEK IMMEDIATE MEDICAL CARE IF:  Any of the following symptoms occur over the next 12 hours: Shaking chills. You have a temperature by mouth above 102 F (38.9 C), not controlled by medicine. Chest, back, or muscle pain. People around you feel you are not acting correctly or are confused. Shortness of breath or difficulty breathing. Dizziness and fainting. You get a rash or develop hives. You have a decrease in urine output. Your urine turns a dark color or changes to pink, red, or brown. Any of the following symptoms occur over the next 10 days: You have a temperature by mouth above 102 F (38.9 C), not controlled by medicine. Shortness of breath. Weakness after normal activity. The white part of the eye turns yellow (jaundice). You have a decrease in the amount of urine or are urinating less often. Your urine turns a dark color or changes to pink, red, or brown. Document Released: 04/28/2000 Document Revised: 07/24/2011 Document Reviewed: 12/16/2007 [facility] Patient Information 2014 [place], [place].  _______________________________________________________________________

## 2024-04-18 ENCOUNTER — Ambulatory Visit: Attending: Cardiovascular Disease | Admitting: Nurse Practitioner

## 2024-04-18 DIAGNOSIS — Z01810 Encounter for preprocedural cardiovascular examination: Secondary | ICD-10-CM

## 2024-04-18 NOTE — Progress Notes (Signed)
 Virtual Visit via Telephone Note   Because of Joel Beaumier Ropp Sr. co-morbid illnesses, he is at least at moderate risk for complications without adequate follow up.  This format is felt to be most appropriate for this patient at this time.  Due to technical limitations with video connection (technology), today's appointment will be conducted as an audio only telehealth visit, and Altariq Goodall Plack Sr. verbally agreed to proceed in this manner.   All issues noted in this document were discussed and addressed.  No physical exam could be performed with this format.  Evaluation Performed:  Preoperative cardiovascular risk assessment _____________   Date:  04/18/2024   Patient ID:  Joel BROCKS Mathwig Sr., DOB 03-Jul-1945, MRN [id number] Patient Location:  Home Provider location:   Office  Primary Care Provider:  [name], MD Primary Cardiologist:  Redell Shallow, MD  Chief Complaint / Patient Profile   78 y.o. y/o male with a h/o persistent atrial fibrillation s/p ablation 05/2023, chronic anticoagulation, sinus bradycardia, mild nonobstructive CAD, rheumatoid arthritis, moderate tricuspid valve regurgitation and normal LVEF on echo 11/15/23, HTN, HLD, type 2 diabetes who is pending right total knee arthroplasty with Dr. Vernetta on date TBD and presents today for telephonic preoperative cardiovascular risk assessment.  History of Present Illness    Joel Marrazzo Daily Sr. is a 78 y.o. male who presents via audio/video conferencing for a telehealth visit today.  Pt was last seen in cardiology clinic on 02/20/24 by Dr. Nancey.  At that time Joel Cianci Woolworth Sr. was doing well. There were concerns for bradycardia following office visit August 2025 and cardiac monitor was pursued.  Monitor revealed predominant sinus rhythm with average HR 57 bpm, sinus bradycardia and tachycardia, with occasional short runs of SVT, occasional PACs, PVCs, couplets, no prolonged pauses, no atrial fibrillation. The patient is now pending  procedure as outlined above. Since his last visit, he denies chest pain, shortness of breath, lower extremity edema, fatigue, palpitations, melena, hematuria, hemoptysis, presyncope, syncope, orthopnea, and PND. He is somewhat limited by knee pain but is able to complete > 4 METS with yard and house work as well as pushing his wife in a wheelchair without concerning cardiac symptoms.   Past Medical History    Past Medical History:  Diagnosis Date   A-fib (HCC)    AKI (acute kidney injury) 05/18/2021   Allergy    Anemia    Arthritis    all over   Bradycardia    Chronic bronchitis (HCC)    get it just about q yr (03/17/2014)   Complication of anesthesia    difficulty waking up   Daily headache    here lately (03/17/2014); relates to sinuses   GERD (gastroesophageal reflux disease)    Hard of hearing    hearing aides bilat   Hiatal hernia    History of blood transfusion 2008   related to OR   History of kidney stones    Hyperlipidemia    Hypertension    Pneumonia 1972 X 1   Rheumatoid arthritis (HCC)    Type II diabetes mellitus (HCC)    Wears glasses    Past Surgical History:  Procedure Laterality Date   ATRIAL FIBRILLATION ABLATION N/A 05/23/2023   Procedure: ATRIAL FIBRILLATION ABLATION;  Surgeon: Nancey Eulas BRAVO, MD;  Location: [facility] INVASIVE CV LAB;  Service: Cardiovascular;  Laterality: N/A;   broken finger      surgical repaired left hand 2nd finger    CARDIOVERSION N/A  11/08/2022   Procedure: CARDIOVERSION;  Surgeon: Alvan Ronal BRAVO, MD;  Location: [facility] INVASIVE CV LAB;  Service: Cardiovascular;  Laterality: N/A;   CHOLECYSTECTOMY  09/05/2023   cyst removed      per left knee/posteriorly   CYSTECTOMY Left 2009   behind knee   INGUINAL HERNIA REPAIR Right ?2010   IR CV LINE INJECTION  10/02/2023   IR CV LINE INJECTION  10/10/2023   IR GUIDED DRAIN W CATHETER PLACEMENT  10/11/2023   IR RADIOLOGIST EVAL & MGMT  10/19/2023   IR THORACENTESIS ASP PLEURAL SPACE  W/IMG GUIDE  10/03/2023   JOINT REPLACEMENT     KNEE ARTHROSCOPY Bilateral 1980's   LIVER BIOPSY N/A 09/05/2023   Procedure: BIOPSY, LIVER;  Surgeon: Mavis Anes, MD;  Location: [facility];  Service: General;  Laterality: N/A;   PARTIAL KNEE ARTHROPLASTY Right 08/27/2015   Procedure: RIGHT UNICOMPARTMENTAL KNEE ARTHROPLASTY;  Surgeon: Lonni CINDERELLA Poli, MD;  Location: [facility] OR[facility];  Service: Orthopedics;  Laterality: Right;   REVISION TOTAL KNEE ARTHROPLASTY Left 2008   SHOULDER SURGERY Left 2019   TOTAL KNEE ARTHROPLASTY Left 2003   VIDEO ASSISTED THORACOSCOPY (VATS)/DECORTICATION Right 10/14/2023   Procedure: VIDEO ASSISTED THORACOSCOPY (VATS)/DECORTICATION;  Surgeon: Kerrin Elspeth BROCKS, MD;  Location: [facility] OR;  Service: Thoracic;  Laterality: Right;  DRAINAGE OF PLEURAL EFFUSION    Allergies  Allergies  Allergen Reactions   Codeine Swelling   Lipitor [Atorvastatin] Other (See Comments)    Muscle aches    Invokana [Canagliflozin] Other (See Comments)    Stomach upset    Losartan Nausea And Vomiting   Roxicodone  [Oxycodone ] Itching   Vicodin [Hydrocodone -Acetaminophen ] Itching    Home Medications    Prior to Admission medications   Medication Sig Start Date End Date Taking? Authorizing Provider  Abatacept  (ORENCIA  IV) Inject 750 mg into the vein every 28 (twenty-eight) days.    [provider]  Accu-Chek FastClix Lancets MISC use to check blood sugar fingerstick Six times a day IC-10 CODE: E11.65; Duration: 90 days    [provider]  albuterol  (PROVENTIL ) (2.5 MG/3ML) 0.083% nebulizer solution Take 3 mLs (2.5 mg total) by nebulization every 6 (six) hours as needed for wheezing or shortness of breath. 10/02/23   [name], MD  albuterol  (VENTOLIN  HFA) [phone] Base) MCG/ACT inhaler Inhale 2 puffs into the lungs every 6 (six) hours as needed for wheezing or shortness of breath. 10/01/23   [name], MD  amLODipine  (NORVASC ) 5 MG tablet Take 1 tablet (5 mg  total) by mouth daily. 09/04/23   [name], MD  apixaban  (ELIQUIS ) 5 MG TABS tablet Take 1 tablet (5 mg total) by mouth 2 (two) times daily. 07/16/23   Pietro Redell RAMAN, MD  ascorbic acid  (VITAMIN C ) 500 MG tablet Take 500 mg by mouth in the morning.    [provider]  calcium carbonate (OSCAL) 1500 (600 Ca) MG TABS tablet Take 600 mg of elemental calcium by mouth in the morning.    [provider]  calcium carbonate (TUMS - DOSED IN MG ELEMENTAL CALCIUM) 500 MG chewable tablet Chew 1 tablet by mouth as needed for indigestion or heartburn.    [provider]  cholecalciferol (VITAMIN D3) 25 MCG (1000 UNIT) tablet Take 1,000 Units by mouth in the morning.    [provider]  Glucose Blood (ACCU-CHEK GUIDE VI) check blood sugar In Vitro Six times a day IC-10 CODE: E11.65; Duration: 100 days    [provider]  Insulin  Aspart, w/Niacinamide, (FIASP ) 100 UNIT/ML SOLN 77 Units continuous. Insulin  pump 09/27/22   [provider]  insulin  glargine (LANTUS ) 100 UNIT/ML injection Inject 16 Units into the skin as needed (If pump goes out).    [provider]  KRILL OIL PO Take 400 mg by mouth in the morning.    [provider]  loratadine  (CLARITIN ) 10 MG tablet Take 10 mg by mouth in the morning.    [provider]  LYUMJEV  100 UNIT/ML SOLN 77 units/a day Injection in [employer]onic insulin  pump ICD-10 diagnosis code E11.65 09/26/23   [provider]  Multiple Vitamin (MULTIVITAMIN WITH MINERALS) TABS Take 1 tablet by mouth in the morning. Centrum    [provider]  ondansetron  (ZOFRAN -ODT) 4 MG disintegrating tablet Take 1 tablet (4 mg total) by mouth every 8 (eight) hours as needed for nausea or vomiting. 09/09/23   Yolande Lamar BROCKS, MD  ONE TOUCH ULTRA TEST test strip 1 each daily. Pt says 2-3 times daily 05/26/16   [provider]  pantoprazole  (PROTONIX ) 40 MG tablet TAKE 1 TABLET BY MOUTH DAILY  06/27/23   Mealor, Augustus E, MD  potassium chloride  SA (KLOR-CON  M) 20 MEQ tablet Take 1 tablet (20 mEq total) by mouth daily. 01/11/24   [name], MD  pravastatin  (PRAVACHOL ) 40 MG tablet Take 40 mg by mouth in the morning.    [provider]  pyridOXINE (VITAMIN B6) 50 MG tablet Take 50 mg by mouth in the morning.    [provider]  ramipril  (ALTACE ) 5 MG capsule Take 1 capsule (5 mg total) by mouth daily. 09/04/23   [name], MD  traMADol  (ULTRAM ) 50 MG tablet Take 1 tablet (50 mg total) by mouth every 12 (twelve) hours as needed for moderate pain (pain score 4-6). 01/01/24   [name], MD  triamcinolone  cream (KENALOG ) 0.1 % Apply 1 Application topically 2 (two) times daily. 01/01/24   [name], MD  vitamin E  400 UNIT capsule Take 400 Units by mouth in the morning.    [provider]  zinc gluconate 50 MG tablet Take 50 mg by mouth in the morning.    [provider]    Physical Exam    Vital Signs:  Reinhardt Licausi Crandall Sr. does not have vital signs available for review today.  Given telephonic nature of communication, physical exam is limited. AAOx3. NAD. Normal affect.  Speech and respirations are unlabored.  Accessory Clinical Findings    None  Assessment & Plan    1.  Preoperative Cardiovascular Risk Assessment: According to the Revised Cardiac Risk Index (RCRI), his Perioperative Risk of Major Cardiac Event is (%): 6.6. His Functional Capacity in METs is: 6.61 according to the Duke Activity Status Index (DASI). The patient is doing well from a cardiac perspective. Therefore, based on ACC/AHA guidelines, the patient would be at acceptable risk for the planned procedure without further cardiovascular testing.   The patient was advised that if he develops new symptoms prior to surgery to contact our office to arrange for a follow-up visit, and he verbalized understanding.  Per office protocol, he Mishkin hold Eliquis  for 3 days prior  to procedure and should resume as soon as hemodynamically stable postoperatively. He will not need bridging with Lovenox  around procedure.  A copy of this note will be routed to requesting surgeon.  Time:   Today, I have spent 10 minutes with the patient with telehealth technology  discussing medical history, symptoms, and management plan.     Rosaline EMERSON Bane, NP-C  04/18/2024, 9:02 AM [address], [address] [place], [place] [address] Office [phone] Fax [phone]

## 2024-04-23 ENCOUNTER — Other Ambulatory Visit: Payer: Self-pay

## 2024-04-23 ENCOUNTER — Encounter (HOSPITAL_COMMUNITY): Payer: Self-pay

## 2024-04-23 ENCOUNTER — Encounter (HOSPITAL_COMMUNITY)
Admission: RE | Admit: 2024-04-23 | Discharge: 2024-04-23 | Disposition: A | Source: Ambulatory Visit | Attending: Orthopaedic Surgery

## 2024-04-23 VITALS — BP 142/70 | HR 52 | Temp 98.1°F | Resp 12 | Ht 66.0 in | Wt 197.0 lb

## 2024-04-23 DIAGNOSIS — Z794 Long term (current) use of insulin: Secondary | ICD-10-CM | POA: Insufficient documentation

## 2024-04-23 DIAGNOSIS — Z01818 Encounter for other preprocedural examination: Secondary | ICD-10-CM

## 2024-04-23 DIAGNOSIS — E119 Type 2 diabetes mellitus without complications: Secondary | ICD-10-CM

## 2024-04-23 DIAGNOSIS — Z01812 Encounter for preprocedural laboratory examination: Secondary | ICD-10-CM | POA: Insufficient documentation

## 2024-04-23 LAB — CBC
HCT: 40.3 % (ref 39.0–52.0)
Hemoglobin: 13.7 g/dL (ref 13.0–17.0)
MCH: 30.7 pg (ref 26.0–34.0)
MCHC: 34 g/dL (ref 30.0–36.0)
MCV: 90.4 fL (ref 80.0–100.0)
Platelets: 232 K/uL (ref 150–400)
RBC: 4.46 MIL/uL (ref 4.22–5.81)
RDW: 13.1 % (ref 11.5–15.5)
WBC: 5.5 K/uL (ref 4.0–10.5)
nRBC: 0 % (ref 0.0–0.2)

## 2024-04-23 LAB — BASIC METABOLIC PANEL WITH GFR
Anion gap: 9 (ref 5–15)
BUN: 21 mg/dL (ref 8–23)
CO2: 27 mmol/L (ref 22–32)
Calcium: 9.9 mg/dL (ref 8.9–10.3)
Chloride: 103 mmol/L (ref 98–111)
Creatinine, Ser: 1.07 mg/dL (ref 0.61–1.24)
GFR, Estimated: >60 mL/min (ref >60)
Glucose, Bld: 101 mg/dL — ABNORMAL HIGH (ref 70–99)
Potassium: 4.3 mmol/L (ref 3.5–5.1)
Sodium: 140 mmol/L (ref 135–145)

## 2024-04-23 LAB — HEMOGLOBIN A1C
Hgb A1c MFr Bld: 7.8 % — ABNORMAL HIGH (ref 4.8–5.6)
Mean Plasma Glucose: 177.16 mg/dL

## 2024-04-23 LAB — SURGICAL PCR SCREEN
MRSA, PCR: NEGATIVE
Staphylococcus aureus: NEGATIVE

## 2024-04-23 LAB — GLUCOSE, CAPILLARY: Glucose-Capillary: 141 mg/dL — ABNORMAL HIGH (ref 70–99)

## 2024-05-01 ENCOUNTER — Inpatient Hospital Stay (HOSPITAL_COMMUNITY): Admission: RE | Admit: 2024-05-01 | Source: Ambulatory Visit

## 2024-05-01 ENCOUNTER — Encounter (HOSPITAL_COMMUNITY)

## 2024-05-02 ENCOUNTER — Inpatient Hospital Stay (HOSPITAL_COMMUNITY): Admission: RE | Admit: 2024-05-02 | Source: Ambulatory Visit | Admitting: Orthopaedic Surgery

## 2024-05-02 ENCOUNTER — Encounter (HOSPITAL_COMMUNITY): Admission: RE | Payer: Self-pay | Source: Ambulatory Visit

## 2024-05-02 ENCOUNTER — Encounter (HOSPITAL_COMMUNITY): Payer: Self-pay | Admitting: Medical

## 2024-05-02 LAB — TYPE AND SCREEN
ABO/RH(D): O POS
Antibody Screen: NEGATIVE

## 2024-05-02 SURGERY — CONVERSION, ARTHROPLASTY, KNEE, PARTIAL, TO TOTAL KNEE ARTHROPLASTY
Anesthesia: Spinal | Site: Knee | Laterality: Right

## 2024-05-13 ENCOUNTER — Ambulatory Visit (HOSPITAL_COMMUNITY)
Admission: RE | Admit: 2024-05-13 | Discharge: 2024-05-13 | Disposition: A | Discharge status: Home / Self Care | Source: Ambulatory Visit | Attending: Rheumatology | Admitting: Rheumatology

## 2024-05-13 ENCOUNTER — Ambulatory Visit: Payer: Self-pay | Admitting: Rheumatology

## 2024-05-13 VITALS — BP 155/61 | HR 45 | Temp 97.5°F | Resp 16

## 2024-05-13 DIAGNOSIS — M0579 Rheumatoid arthritis with rheumatoid factor of multiple sites without organ or systems involvement: Secondary | ICD-10-CM | POA: Insufficient documentation

## 2024-05-13 DIAGNOSIS — Z79899 Other long term (current) drug therapy: Secondary | ICD-10-CM | POA: Diagnosis present

## 2024-05-13 LAB — COMPREHENSIVE METABOLIC PANEL WITH GFR
ALT: 24 U/L (ref 0–44)
AST: 32 U/L (ref 15–41)
Albumin: 4.2 g/dL (ref 3.5–5.0)
Alkaline Phosphatase: 118 U/L (ref 38–126)
Anion gap: 8 (ref 5–15)
BUN: 20 mg/dL (ref 8–23)
CO2: 26 mmol/L (ref 22–32)
Calcium: 9.2 mg/dL (ref 8.9–10.3)
Chloride: 99 mmol/L (ref 98–111)
Creatinine, Ser: 0.96 mg/dL (ref 0.61–1.24)
GFR, Estimated: >60 mL/min
Glucose, Bld: 282 mg/dL — ABNORMAL HIGH (ref 70–99)
Potassium: 5 mmol/L (ref 3.5–5.1)
Sodium: 134 mmol/L — ABNORMAL LOW (ref 135–145)
Total Bilirubin: 0.3 mg/dL (ref 0.0–1.2)
Total Protein: 6.5 g/dL (ref 6.5–8.1)

## 2024-05-13 LAB — CBC WITH DIFFERENTIAL/PLATELET
Abs Immature Granulocytes: 0.02 K/uL (ref 0.00–0.07)
Basophils Absolute: 0.1 K/uL (ref 0.0–0.1)
Basophils Relative: 1 %
Eosinophils Absolute: 0.1 K/uL (ref 0.0–0.5)
Eosinophils Relative: 2 %
HCT: 37.1 % — ABNORMAL LOW (ref 39.0–52.0)
Hemoglobin: 12.7 g/dL — ABNORMAL LOW (ref 13.0–17.0)
Immature Granulocytes: 0 %
Lymphocytes Relative: 32 %
Lymphs Abs: 1.9 K/uL (ref 0.7–4.0)
MCH: 31.2 pg (ref 26.0–34.0)
MCHC: 34.2 g/dL (ref 30.0–36.0)
MCV: 91.2 fL (ref 80.0–100.0)
Monocytes Absolute: 0.6 K/uL (ref 0.1–1.0)
Monocytes Relative: 11 %
Neutro Abs: 3.2 K/uL (ref 1.7–7.7)
Neutrophils Relative %: 54 %
Platelets: 205 K/uL (ref 150–400)
RBC: 4.07 MIL/uL — ABNORMAL LOW (ref 4.22–5.81)
RDW: 12.8 % (ref 11.5–15.5)
WBC: 5.8 K/uL (ref 4.0–10.5)
nRBC: 0 % (ref 0.0–0.2)

## 2024-05-13 MED ORDER — SODIUM CHLORIDE 0.9 % IV SOLN
750.0000 mg | Freq: Once | INTRAVENOUS | Status: AC
  Administered 2024-05-13: 750 mg via INTRAVENOUS
  Filled 2024-05-13: qty 30

## 2024-05-13 MED ORDER — ACETAMINOPHEN 325 MG PO TABS: 650.0000 mg | ORAL_TABLET | Freq: Once | ORAL | Status: DC

## 2024-05-13 MED ORDER — DIPHENHYDRAMINE HCL 25 MG PO CAPS: 25.0000 mg | ORAL_CAPSULE | Freq: Once | ORAL | Status: DC

## 2024-05-13 NOTE — Progress Notes (Signed)
 Glucose is elevated.  Please notify patient and forward the results to his PCP.  Hemoglobin is low.  Patient should take multivitamin with iron.

## 2024-05-29 ENCOUNTER — Encounter (HOSPITAL_COMMUNITY)

## 2024-06-05 ENCOUNTER — Telehealth: Payer: Self-pay

## 2024-06-05 NOTE — Telephone Encounter (Signed)
 Copied from CRM #[id number]. Topic: Clinical - Medication Question >> Jun 05, 2024 11:39 AM Roselie BROCKS wrote: Reason for CRM: Patient has gotten poison oak on most of his body and requests a medication to be called in for it.  And requests it to go to  [facility] [phone] - [place], [place] - [address]

## 2024-06-10 ENCOUNTER — Ambulatory Visit (HOSPITAL_COMMUNITY)
Admission: RE | Admit: 2024-06-10 | Discharge: 2024-06-10 | Disposition: A | Discharge status: Home / Self Care | Source: Ambulatory Visit | Attending: Rheumatology | Admitting: Rheumatology

## 2024-06-10 VITALS — BP 128/56 | HR 49 | Temp 97.6°F | Wt 202.4 lb

## 2024-06-10 DIAGNOSIS — M0579 Rheumatoid arthritis with rheumatoid factor of multiple sites without organ or systems involvement: Secondary | ICD-10-CM | POA: Diagnosis present

## 2024-06-10 MED ORDER — DIPHENHYDRAMINE HCL 25 MG PO CAPS: 25.0000 mg | ORAL_CAPSULE | Freq: Once | ORAL | Status: DC

## 2024-06-10 MED ORDER — SODIUM CHLORIDE 0.9 % IV SOLN
750.0000 mg | Freq: Once | INTRAVENOUS | Status: AC
  Administered 2024-06-10: 750 mg via INTRAVENOUS
  Filled 2024-06-10: qty 30

## 2024-06-10 MED ORDER — ACETAMINOPHEN 325 MG PO TABS: 650.0000 mg | ORAL_TABLET | Freq: Once | ORAL | Status: DC

## 2024-06-18 ENCOUNTER — Other Ambulatory Visit: Payer: Self-pay | Admitting: Cardiology

## 2024-06-18 DIAGNOSIS — I4891 Unspecified atrial fibrillation: Secondary | ICD-10-CM

## 2024-06-25 ENCOUNTER — Ambulatory Visit: Admitting: Rheumatology

## 2024-07-04 ENCOUNTER — Ambulatory Visit: Admitting: Internal Medicine

## 2024-07-08 ENCOUNTER — Encounter (HOSPITAL_COMMUNITY)

## 2024-07-09 ENCOUNTER — Ambulatory Visit: Admitting: Physician Assistant

## 2024-08-20 ENCOUNTER — Ambulatory Visit (HOSPITAL_COMMUNITY): Admitting: Physician Assistant

## 2024-12-01 ENCOUNTER — Ambulatory Visit

## 2024-12-02 ENCOUNTER — Ambulatory Visit
